# Patient Record
Sex: Male | Born: 1960 | Race: White | Hispanic: No | Marital: Married | Smoking: Former smoker
Health system: Southern US, Community
[De-identification: ages and names within clinical notes are randomized; demographics above are authoritative.]

## PROBLEM LIST (undated history)

## (undated) DIAGNOSIS — F419 Anxiety disorder, unspecified: Secondary | ICD-10-CM

## (undated) DIAGNOSIS — C182 Malignant neoplasm of ascending colon: Secondary | ICD-10-CM

## (undated) DIAGNOSIS — I10 Essential (primary) hypertension: Secondary | ICD-10-CM

## (undated) DIAGNOSIS — J302 Other seasonal allergic rhinitis: Secondary | ICD-10-CM

## (undated) HISTORY — PX: COLON SURGERY: SHX602

---
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
## 1990-05-15 HISTORY — PX: APPENDECTOMY: SHX54

## 1996-05-15 HISTORY — PX: KNEE ARTHROSCOPY W/ MENISCECTOMY: SHX1879

## 2003-05-16 HISTORY — PX: VASECTOMY: SHX75

## 2015-04-15 HISTORY — PX: COLONOSCOPY W/ BIOPSIES AND POLYPECTOMY: SHX1376

## 2016-04-14 HISTORY — PX: CATARACT EXTRACTION W/ INTRAOCULAR LENS IMPLANT: SHX1309

## 2016-05-30 ENCOUNTER — Telehealth: Payer: Self-pay | Admitting: *Deleted

## 2016-05-30 NOTE — Telephone Encounter (Signed)
Oncology Nurse Navigator Documentation  Oncology Nurse Navigator Flowsheets 05/30/2016  Navigator Location [facility]  Referral date to RadOnc/MedOnc 05/30/2016  Navigator Encounter Type Introductory phone call  Abnormal Finding Date 05/06/2015  Confirmed Diagnosis Date 05/06/2015  Spoke with patient and provided new patient appointment for 06/05/16 at 3:00 (lab) and 3:30 ([name]). Informed of location of [place], valet service, and registration process. Reminded to bring photo ID,  insurance cards and a current medication list, including supplements. Requested he obtain his CT scan from 05/29/16 on CD from [facility] and bring it with him to the appointment. He agrees to do so. Patient verbalizes understanding. Notified HIM of appointment and requested they alert referring provider of the appointment.

## 2016-06-01 ENCOUNTER — Telehealth: Payer: Self-pay | Admitting: *Deleted

## 2016-06-01 ENCOUNTER — Encounter: Payer: Self-pay | Admitting: *Deleted

## 2016-06-01 ENCOUNTER — Other Ambulatory Visit: Payer: BLUE CROSS/BLUE SHIELD

## 2016-06-01 ENCOUNTER — Encounter: Payer: Self-pay | Admitting: Hematology

## 2016-06-01 ENCOUNTER — Telehealth: Payer: Self-pay | Admitting: Hematology

## 2016-06-01 ENCOUNTER — Ambulatory Visit (HOSPITAL_BASED_OUTPATIENT_CLINIC_OR_DEPARTMENT_OTHER): Payer: BLUE CROSS/BLUE SHIELD | Admitting: Hematology

## 2016-06-01 DIAGNOSIS — R634 Abnormal weight loss: Secondary | ICD-10-CM

## 2016-06-01 DIAGNOSIS — D649 Anemia, unspecified: Secondary | ICD-10-CM

## 2016-06-01 DIAGNOSIS — C189 Malignant neoplasm of colon, unspecified: Secondary | ICD-10-CM

## 2016-06-01 DIAGNOSIS — R630 Anorexia: Secondary | ICD-10-CM

## 2016-06-01 DIAGNOSIS — R932 Abnormal findings on diagnostic imaging of liver and biliary tract: Secondary | ICD-10-CM

## 2016-06-01 DIAGNOSIS — C787 Secondary malignant neoplasm of liver and intrahepatic bile duct: Principal | ICD-10-CM

## 2016-06-01 DIAGNOSIS — R109 Unspecified abdominal pain: Secondary | ICD-10-CM | POA: Diagnosis not present

## 2016-06-01 NOTE — Progress Notes (Signed)
Oncology Nurse Navigator Documentation  Oncology Nurse Navigator Flowsheets 06/01/2016  Navigator Location [facility]  Referral date to RadOnc/MedOnc -  Navigator Encounter Type Initial MedOnc  Abnormal Finding Date -  Confirmed Diagnosis Date -  Patient Visit Type MedOnc;Initial  Treatment Phase Pre-Tx/Tx Discussion;Abnormal Scans--MD will take CD to radiology to load and review  Barriers/Navigation Needs Education;Coordination of Care  Education Newly Diagnosed Cancer Education  Interventions Education;Coordination of Care  Coordination of Care Radiology--message to managed care for PET authorization asap  Education Method Verbal;Written  Support Groups/Services GI Support Group;Other--[place] Support Services; declined ACS   Acuity Level 2  Time Spent with Patient 15  Met with patient and wife after new patient visit. Explained the role of the GI Nurse Navigator and provided New Patient Packet with information on: 1. Colon cancer--CEA test, anatomy of colon 2. Support groups 3. Advanced Directives 4. Fall Safety Plan Answered questions, reviewed current treatment plan using TEACH back and provided emotional support. Provided copy of current treatment plan. Inquired if he is willing to go to [facility] for PET if it can be done sooner there and he agrees. He is anxious to get started on whatever he needs to do to stay well. Escorted he and his wife to scheduler.  Nicholas Elks, RN, BSN GI Oncology [place]

## 2016-06-01 NOTE — Progress Notes (Signed)
[address]  Telephone:([phone] Fax:([phone] [place] Note   Patient Care Team: Curlene Labrum, MD as PCP - General (Family Medicine) 06/01/2016  Referring physician: Curlene Labrum, MD  CHIEF COMPLAINTS/PURPOSE OF CONSULTATION:  Right colon cancer and liver mass  Oncology History   Cancer Staging Metastatic colon cancer to liver [facility]) Staging form: Colon and Rectum - Neuroendocine Tumors, AJCC 8th Edition - Clinical stage from 06/01/2016: Stage IV (cTX, cNX, cM1a) - Signed by Truitt Merle, MD on 06/01/2016       Metastatic colon cancer to liver ([place])   04/2015 Procedure    Colonoscopy by Dr. Ladona Horns. It showed showed 2 sessile polyps ready between 3-5 mm in size located 20 cm (A, B) from the point of entry, polypectomy was performed. Pedunculated polyp was found in the ascending colon (C), polypectomy was performed, and additional polyp (D) was found 30 cm from the point of entry, removed      04/2015 Pathology Results    tubular adenoma (A and B), and well differentiated adenocarcinoma arising from tubulovillous adenoma (C) and well differentiated adenocarcinoma arising from severe dysplasia to intramucosal carcinoma within tubular adenoma.       04/2015 Initial Diagnosis    Metastatic colon cancer to liver ([place])      05/29/2016 Imaging    CT abdomen and pelvis with contrast showed an apple core like stricture in right colon just above the cecum, measuring 3.2 cm in lengths. This is highly suspicious for malignancy. Small lymph node a noticed he had adjacent mesentery, measuring 8 mm. There is a low-density lesion within the inferior aspect of the right hepatic lobe measuring 1.7 cm, suspicious for [place].        HISTORY OF PRESENTING ILLNESS:  Nicholas Lowery 56 y.o. male is here because of his recently abdominal CT which is highly suspicious for metastatic colon cancer. He is accompanied by his wife to my clinic today. He was  referred by his primary care physician Dr. Pleas Koch.   He had colonoscopy in 2016 for mild rectal bleeding, he describe small amount fresh blood mixed with stool, he has no other constitutional symptoms at that time. He was referred to gastroenterologist Dr. Ladona Horns and underwent a colonoscopy in December 2016. The colonoscopy showed 2 sessile polyps ready between 3-5 mm in size located 20 cm (A, B) from the point of entry, polypectomy was performed. Pedunculated polyp was found in the ascending colon (C), polypectomy was performed, and additional polyp (D) was found 30 cm from the point of entry, removed. The pathology reviewed tubular adenoma (A and B), and well differentiated adenocarcinoma arising from tubulovillous adenoma (C) and well differentiated adenocarcinoma arising from severe dysplasia to intramucosal carcinoma within tubular adenoma. Dr. Tye [place] tried multiple times to reach patient, but patient sought they were calling him about the bill, and did not return the phone calls. He was not aware the cancer diagnosis until recently.   He started having diarrhea and vomiting in mid Dec 2017, and felt a "pop" in right side abdomen, he was seen at urgent care, and was treated with antiemetics, and IVF, lab tests were OK. Due to his persistent intermittent diarrhea and epigastric pain since then, he was seen by PCP and he eventually had CT abdomen and pelvis scan which showed a upper core lesion in the ascending colon, and I'll 1.7 cm lesion in the liver, highly suspicious for metastasis. He was referred to [place] for further evaluation.  He  has lost 30 lbs in the past one month, has low appetite, eats a small meals 1-2 times a day. He has moderate fatigue, able to tolerate routine activities including his work, but feels exhausted at the end of study. He has occasional constipation, denies recent rectal bleeding.  MEDICAL HISTORY:  History reviewed. No pertinent past medical history.  SURGICAL  HISTORY: Past Surgical History:  Procedure Laterality Date  . hernia repaire    . Right knee surgery, meniscectomy  1998  . VASECTOMY  2005    SOCIAL HISTORY: Social History   Social History  . Marital status: Single    Spouse name: N/A  . Number of children: N/A  . Years of education: N/A   Occupational History  . Not on file.   Social History Main Topics  . Smoking status: Never Smoker  . Smokeless tobacco: Never Used  . Alcohol use 2.4 oz/week    4 Cans of beer per week     Comment: quit in 04/2016   . Drug use: No  . Sexual activity: Not on file   Other Topics Concern  . Not on file   Social History Narrative  . No narrative on file   He is married.  They have 3 boys, 53-12 yo. He works for a [employer], desk job.   FAMILY HISTORY: Family History  Problem Relation Age of Onset  . Cancer Mother     lung cancer  . Hypertension Father   . CAD Father   . Cancer Maternal Grandfather     prostate cancer     ALLERGIES:  has no allergies on file.  MEDICATIONS:  Current Outpatient Prescriptions  Medication Sig Dispense Refill  . FLUoxetine (PROZAC) 40 MG capsule Take 40 mg by mouth daily.    . celecoxib (CELEBREX) 200 MG capsule Take 200 mg by mouth daily.  1  . ondansetron (ZOFRAN-ODT) 4 MG disintegrating tablet   0  . promethazine (PHENERGAN) 25 MG tablet Take 25 mg by mouth every 6 (six) hours as needed. for nausea  1   No current facility-administered medications for this visit.     REVIEW OF SYSTEMS:   Constitutional: Denies fevers, chills or abnormal night sweats Eyes: Denies blurriness of vision, double vision or watery eyes Ears, nose, mouth, throat, and face: Denies mucositis or sore throat Respiratory: Denies cough, dyspnea or wheezes Cardiovascular: Denies palpitation, chest discomfort or lower extremity swelling Gastrointestinal:  Denies nausea, heartburn or change in bowel habits Skin: Denies abnormal skin rashes Lymphatics:  Denies new lymphadenopathy or easy bruising Neurological:Denies numbness, tingling or new weaknesses Behavioral/Psych: Mood is stable, no new changes  All other systems were reviewed with the patient and are negative.  PHYSICAL EXAMINATION: ECOG PERFORMANCE STATUS: 1 - Symptomatic but completely ambulatory  Vitals:   06/01/16 1348  BP: (!) 150/96  Pulse: 72  Resp: 18  Temp: 97.8 F (36.6 C)   Filed Weights   06/01/16 1348  Weight: 195 lb 8 oz (88.7 kg)    GENERAL:alert, no distress and comfortable SKIN: skin color, texture, turgor are normal, no rashes or significant lesions EYES: normal, conjunctiva are pink and non-injected, sclera clear OROPHARYNX:no exudate, no erythema and lips, buccal mucosa, and tongue normal  NECK: supple, thyroid normal size, non-tender, without nodularity LYMPH:  no palpable lymphadenopathy in the cervical, axillary or inguinal LUNGS: clear to auscultation and percussion with normal breathing effort HEART: regular rate & rhythm and no murmurs and no lower extremity edema ABDOMEN:abdomen soft,  non-tender and normal bowel sounds Musculoskeletal:no cyanosis of digits and no clubbing  PSYCH: alert & oriented x 3 with fluent speech NEURO: no focal motor/sensory deficits  LABORATORY DATA:  I have reviewed the data as listed No flowsheet data found.  I reviewed his outside lab from 05/22/2016     Hepatitis A IgM negative, HBsAg (-), HCV Ab (-)  WBC 3.6, hemoglobin 11.7, hematocrit 35.2%, MCV 91, PLT 238k CMP normal                       RADIOGRAPHIC STUDIES: I have personally reviewed his outside CT scan from 05/29/2016 and agreed with the findings in the report. No results found.  ASSESSMENT & PLAN:  56 year old Caucasian male, without significant past medical history, presented with rectal bleeding in December 2016, colonoscopy showed 4 polyps, 2 of them showed invasive adenocarcinoma, unfortunately he was not aware of the pathology findings and was  not treated. He now presented with fatigue, weight loss, anorexia and abdominal pain  1. Right colon cancer with probable liver metastasis -I reviewed his outside colonoscopy report, and biopsy results in December 2016, and discussed with patient in details. -I reviewed his outside CT scan from 05/29/2016, and discussed the findings with patient and his wife. -He likely had early stage colon adenocarcinoma in December 2016, unfortunately was not treated, and now has developed liver metastasis. -I recommend a PET scan for further staging, ruled out other metastasis. -I recommend ultrasound-guided liver biopsy to confirm metastasis, and obtain tumor tissue for genomic testing- Foundation One  -I discussed the treatment options for metastatic colon cancer. If the PET scan showed no additional metastasis, his oligo liver metastasis could be potentially curable with chemotherapy and surgery. I recommend him to have systemic chemotherapy first, and consider surgical resection of his primary tumor and liver metastasis after 3-6 months of chemotherapy. His liver lesion is located in the inferior peripheral right upper lobe, resection would be very feasible -He does not appear to have bowel obstruction, or over to GI bleeding, I do not think he needs upfront surgery to remove his primary tumor. -If the PET scan reveals additional multiple metastatic lesions, then his cancer is likely incurable. -I briefly discussed systemic chemotherapy options. Due to his busy working schedule, young age, and good performance status, I would recommend CAPOX with Avastin or Panitumumab, based on Foundation One result  -We'll obtain CEA level  2. Anemia -He has mild anemia, likely related to his colon cancer bleeding -I recommend him to take oral iron supplement over-the-counter, potential side effects of constipation and gastric or discomfort or discussed with him -I'll check is on level on his next lab draw  3. Anorexia  and weight loss -Secondary to underlying malignancy -I encouraged him to try nutritional supplement, such as boost or initial -Nutrition consult  4. Abdominal pain -mild, he takes celecobix as needed   Plan -PET asap -IR liver biopsy, to confirm metastasis, and obtain tumor tissue for Foundation one -I will see him back in 2 weeks after scan and biopsy -will present his case in our GI tumor board after PET scan   Orders Placed This Encounter  Procedures  . NM PET Image Initial (PI) Skull Base To Thigh    Standing Status:   Future    Standing Expiration Date:   06/01/2017    Order Specific Question:   Reason for Exam (SYMPTOM  OR DIAGNOSIS REQUIRED)    Answer:   staging  Order Specific Question:   Preferred imaging location?    Answer:   [facility]    Order Specific Question:   If indicated for the ordered procedure, I authorize the administration of a radiopharmaceutical per Radiology protocol    Answer:   Yes    All questions were answered. The patient knows to call the clinic with any problems, questions or concerns. I spent 55 minutes counseling the patient face to face. The total time spent in the appointment was 60 minutes and more than 50% was on counseling.     Truitt Merle, MD 06/01/2016

## 2016-06-01 NOTE — Telephone Encounter (Signed)
Appointments scheduled per 1/18 LOS. Patient given AVS report and calendars with future scheduled appointments. °

## 2016-06-01 NOTE — Telephone Encounter (Signed)
Called patient to offer appointment for this afternoon due to cancellations. He reports he will leave now for his appointment. MD aware.

## 2016-06-02 ENCOUNTER — Telehealth: Payer: Self-pay | Admitting: *Deleted

## 2016-06-02 NOTE — Telephone Encounter (Signed)
Oncology Nurse Navigator Documentation  Oncology Nurse Navigator Flowsheets 06/02/2016  Navigator Location [facility]  Referral date to RadOnc/MedOnc -  Navigator Encounter Type Telephone  Telephone Outgoing Call;Appt Confirmation/Clarification  Abnormal Finding Date -  Confirmed Diagnosis Date -  Patient Visit Type -  Treatment Phase -  Barriers/Navigation Needs Coordination of Care--PET/biopsy  Education -  Interventions Coordination of Care--scheduled PET for 1/25 at 1200/1230 at [place].  Coordination of Care Radiology--called Vivien Rota in central scheduling to let radiology know that PET is 1/25 and hope for Bx on 1/26 or 1/29 if possible.  Education Method -  Support Groups/Services -  Acuity -  Time Spent with Patient 15  Nicholas Lowery made aware of prep for PET scan.

## 2016-06-05 ENCOUNTER — Other Ambulatory Visit: Payer: Self-pay

## 2016-06-05 ENCOUNTER — Ambulatory Visit: Payer: Self-pay | Admitting: Hematology

## 2016-06-06 ENCOUNTER — Other Ambulatory Visit (HOSPITAL_COMMUNITY)
Admission: RE | Admit: 2016-06-06 | Discharge: 2016-06-06 | Disposition: A | Discharge status: Home / Self Care | Payer: BLUE CROSS/BLUE SHIELD | Source: Ambulatory Visit | Attending: Nurse Practitioner | Admitting: Nurse Practitioner

## 2016-06-06 ENCOUNTER — Other Ambulatory Visit: Payer: Self-pay | Admitting: *Deleted

## 2016-06-06 ENCOUNTER — Telehealth: Payer: Self-pay | Admitting: *Deleted

## 2016-06-06 ENCOUNTER — Encounter: Payer: Self-pay | Admitting: Nurse Practitioner

## 2016-06-06 ENCOUNTER — Ambulatory Visit (HOSPITAL_BASED_OUTPATIENT_CLINIC_OR_DEPARTMENT_OTHER): Payer: BLUE CROSS/BLUE SHIELD

## 2016-06-06 ENCOUNTER — Ambulatory Visit (HOSPITAL_BASED_OUTPATIENT_CLINIC_OR_DEPARTMENT_OTHER): Payer: BLUE CROSS/BLUE SHIELD | Admitting: Nurse Practitioner

## 2016-06-06 VITALS — BP 124/93 | HR 94 | Temp 98.1°F | Resp 18 | Ht 71.0 in | Wt 197.3 lb

## 2016-06-06 DIAGNOSIS — R197 Diarrhea, unspecified: Secondary | ICD-10-CM

## 2016-06-06 DIAGNOSIS — C787 Secondary malignant neoplasm of liver and intrahepatic bile duct: Secondary | ICD-10-CM

## 2016-06-06 DIAGNOSIS — E860 Dehydration: Secondary | ICD-10-CM | POA: Diagnosis not present

## 2016-06-06 DIAGNOSIS — C189 Malignant neoplasm of colon, unspecified: Secondary | ICD-10-CM

## 2016-06-06 DIAGNOSIS — R030 Elevated blood-pressure reading, without diagnosis of hypertension: Secondary | ICD-10-CM

## 2016-06-06 LAB — COMPREHENSIVE METABOLIC PANEL
ALT: 14 U/L (ref 0–55)
AST: 14 U/L (ref 5–34)
Albumin: 3.6 g/dL (ref 3.5–5.0)
Alkaline Phosphatase: 71 U/L (ref 40–150)
Anion Gap: 10 mEq/L (ref 3–11)
BUN: 19.8 mg/dL (ref 7.0–26.0)
CHLORIDE: 104 meq/L (ref 98–109)
CO2: 25 meq/L (ref 22–29)
CREATININE: 1.1 mg/dL (ref 0.7–1.3)
Calcium: 9.7 mg/dL (ref 8.4–10.4)
EGFR: 79 mL/min/{1.73_m2} — ABNORMAL LOW (ref >90)
GLUCOSE: 98 mg/dL (ref 70–140)
POTASSIUM: 4.4 meq/L (ref 3.5–5.1)
SODIUM: 139 meq/L (ref 136–145)
Total Bilirubin: 0.56 mg/dL (ref 0.20–1.20)
Total Protein: 7.1 g/dL (ref 6.4–8.3)

## 2016-06-06 LAB — C DIFFICILE QUICK SCREEN W PCR REFLEX
C DIFFICLE (CDIFF) ANTIGEN: NEGATIVE
C Diff interpretation: NOT DETECTED
C Diff toxin: NEGATIVE

## 2016-06-06 LAB — CBC WITH DIFFERENTIAL/PLATELET
BASO%: 0.6 % (ref 0.0–2.0)
BASOS ABS: 0 10*3/uL (ref 0.0–0.1)
EOS%: 1.5 % (ref 0.0–7.0)
Eosinophils Absolute: 0.1 10*3/uL (ref 0.0–0.5)
HEMATOCRIT: 37.1 % — AB (ref 38.4–49.9)
HGB: 12.3 g/dL — ABNORMAL LOW (ref 13.0–17.1)
LYMPH#: 0.7 10*3/uL — AB (ref 0.9–3.3)
LYMPH%: 14 % (ref 14.0–49.0)
MCH: 29.9 pg (ref 27.2–33.4)
MCHC: 33.2 g/dL (ref 32.0–36.0)
MCV: 90.3 fL (ref 79.3–98.0)
MONO#: 1.3 10*3/uL — ABNORMAL HIGH (ref 0.1–0.9)
MONO%: 28.4 % — AB (ref 0.0–14.0)
NEUT#: 2.6 10*3/uL (ref 1.5–6.5)
NEUT%: 55.5 % (ref 39.0–75.0)
Platelets: 260 10*3/uL (ref 140–400)
RBC: 4.11 10*6/uL — AB (ref 4.20–5.82)
RDW: 13.6 % (ref 11.0–14.6)
WBC: 4.7 10*3/uL (ref 4.0–10.3)

## 2016-06-06 LAB — MAGNESIUM: MAGNESIUM: 2.2 mg/dL (ref 1.5–2.5)

## 2016-06-06 LAB — CEA (IN HOUSE-CHCC): CEA (CHCC-In House): 9.99 ng/mL — ABNORMAL HIGH (ref 0.00–5.00)

## 2016-06-06 MED ORDER — SODIUM CHLORIDE 0.9 % IV SOLN
1000.0000 mL | Freq: Once | INTRAVENOUS | Status: AC
  Administered 2016-06-06: 1000 mL via INTRAVENOUS

## 2016-06-06 MED ORDER — LOPERAMIDE HCL 2 MG PO CAPS
ORAL_CAPSULE | ORAL | Status: AC
  Filled 2016-06-06: qty 2

## 2016-06-06 MED ORDER — DIPHENOXYLATE-ATROPINE 2.5-0.025 MG PO TABS: 2.0000 | ORAL_TABLET | Freq: Four times a day (QID) | ORAL | 0 refills | Status: DC | PRN

## 2016-06-06 MED ORDER — LOPERAMIDE HCL 2 MG PO TABS
4.0000 mg | ORAL_TABLET | Freq: Once | ORAL | Status: AC
  Administered 2016-06-06: 4 mg via ORAL
  Filled 2016-06-06: qty 2

## 2016-06-06 MED ORDER — ONDANSETRON 8 MG PO TBDP: 8.0000 mg | ORAL_TABLET | Freq: Three times a day (TID) | ORAL | 2 refills | Status: DC | PRN

## 2016-06-06 NOTE — Telephone Encounter (Signed)
Received vm call from pt stating that he has severe cramping in his abdomen & kidneys that comes in waves & at worst rates @ 8-9 & brings tears to eyes.  He states that his appetite is poor & is trying to drink but feels dehydrated.  He is having watery diarrhea-8 x's yesterday & twice this am. ' Pt called back & discussed symptoms.  Instructed to come @ 11:30 for labs & see Selena Lesser NP.  He was in agreement.  Labs ordered.

## 2016-06-06 NOTE — Assessment & Plan Note (Signed)
Patient's blood pressure on initial check today was 124/93.  Patient states he has no history of high blood pressure in the past.  He doesn't take any blood pressure medications.  He states he has become extremely stressed and anxious regarding his new cancer diagnosis.  Patient was advised to recheck his blood pressure at home; and to follow-up with his primary care physician regarding his mildly elevated blood pressure.  He was also advised to call/return for continued elevated blood pressures.

## 2016-06-06 NOTE — Assessment & Plan Note (Signed)
Patient states that he has had very frequent watery diarrhea episodes-up to 10 diarrhea episodes per day-for the past several days.  He has not tried any over-the-counter medications for the treatment of the diarrhea.  He feels dehydrated and weak today.  He also complains of some significant cramping in his abdomen as well.  Patient does appear mildly dehydrated today.  He was given Imodium 2 tablets while in the [place] for the cramping and diarrhea.  He also was able to give a stool sample which was negative for C. difficile.  He was given IV fluid rehydration today as well.

## 2016-06-06 NOTE — Assessment & Plan Note (Signed)
Patient was recently diagnosed with colon cancer with metastasis to the liver.  Patient has not yet begun any cancer treatments.  CEA obtained today was 9.99.  Patient is scheduled for a PET scan on 06/08/2016.  He is scheduled for liver biopsy on 06/13/2016.  He is scheduled for labs and a follow-up visit to discuss his scans and make a plan of treatment on 06/15/2016.

## 2016-06-06 NOTE — Patient Instructions (Signed)
Dehydration, Adult Dehydration is a condition in which there is not enough fluid or water in the body. This happens when you lose more fluids than you take in. Important organs, such as the kidneys, brain, and heart, cannot function without a proper amount of fluids. Any loss of fluids from the body can lead to dehydration. Dehydration can range from mild to severe. This condition should be treated right away to prevent it from becoming severe. What are the causes? This condition may be caused by:  Vomiting.  Diarrhea.  Excessive sweating, such as from heat exposure or exercise.  Not drinking enough fluid, especially:  When ill.  While doing activity that requires a lot of energy.  Excessive urination.  Fever.  Infection.  Certain medicines, such as medicines that cause the body to lose excess fluid (diuretics).  Inability to access safe drinking water.  Reduced physical ability to get adequate water and food. What increases the risk? This condition is more likely to develop in people:  Who have a poorly controlled long-term (chronic) illness, such as diabetes, heart disease, or kidney disease.  Who are age 75 or older.  Who are disabled.  Who live in a place with high altitude.  Who play endurance sports. What are the signs or symptoms? Symptoms of mild dehydration may include:  Thirst.  Dry lips.  Slightly dry mouth.  Dry, warm skin.  Dizziness. Symptoms of moderate dehydration may include:  Very dry mouth.  Muscle cramps.  Dark urine. Urine may be the color of tea.  Decreased urine production.  Decreased tear production.  Heartbeat that is irregular or faster than normal (palpitations).  Headache.  Light-headedness, especially when you stand up from a sitting position.  Fainting (syncope). Symptoms of severe dehydration may include:  Changes in skin, such as:  Cold and clammy skin.  Blotchy (mottled) or pale skin.  Skin that does not  quickly return to normal after being lightly pinched and released (poor skin turgor).  Changes in body fluids, such as:  Extreme thirst.  No tear production.  Inability to sweat when body temperature is high, such as in hot weather.  Very little urine production.  Changes in vital signs, such as:  Weak pulse.  Pulse that is more than 100 beats a minute when sitting still.  Rapid breathing.  Low blood pressure.  Other changes, such as:  Sunken eyes.  Cold hands and feet.  Confusion.  Lack of energy (lethargy).  Difficulty waking up from sleep.  Short-term weight loss.  Unconsciousness. How is this diagnosed? This condition is diagnosed based on your symptoms and a physical exam. Blood and urine tests may be done to help confirm the diagnosis. How is this treated? Treatment for this condition depends on the severity. Mild or moderate dehydration can often be treated at home. Treatment should be started right away. Do not wait until dehydration becomes severe. Severe dehydration is an emergency and it needs to be treated in a hospital. Treatment for mild dehydration may include:  Drinking more fluids.  Replacing salts and minerals in your blood (electrolytes) that you may have lost. Treatment for moderate dehydration may include:  Drinking an oral rehydration solution (ORS). This is a drink that helps you replace fluids and electrolytes (rehydrate). It can be found at pharmacies and retail stores. Treatment for severe dehydration may include:  Receiving fluids through an IV tube.  Receiving an electrolyte solution through a feeding tube that is passed through your nose and into  your stomach (nasogastric tube, or NG tube).  Correcting any abnormalities in electrolytes.  Treating the underlying cause of dehydration. Follow these instructions at home:  If directed by your health care provider, drink an ORS:  Make an ORS by following instructions on the  package.  Start by drinking small amounts, about  cup (120 mL) every 5-10 minutes.  Slowly increase how much you drink until you have taken the amount recommended by your health care provider.  Drink enough clear fluid to keep your urine clear or pale yellow. If you were told to drink an ORS, finish the ORS first, then start slowly drinking other clear fluids. Drink fluids such as:  Water. Do not drink only water. Doing that can lead to having too little salt (sodium) in the body (hyponatremia).  Ice chips.  Fruit juice that you have added water to (diluted fruit juice).  Low-calorie sports drinks.  Avoid:  Alcohol.  Drinks that contain a lot of sugar. These include high-calorie sports drinks, fruit juice that is not diluted, and soda.  Caffeine.  Foods that are greasy or contain a lot of fat or sugar.  Take over-the-counter and prescription medicines only as told by your health care provider.  Do not take sodium tablets. This can lead to having too much sodium in the body (hypernatremia).  Eat foods that contain a healthy balance of electrolytes, such as bananas, oranges, potatoes, tomatoes, and spinach.  Keep all follow-up visits as told by your health care provider. This is important. Contact a health care provider if:  You have abdominal pain that:  Gets worse.  Stays in one area (localizes).  You have a rash.  You have a stiff neck.  You are more irritable than usual.  You are sleepier or more difficult to wake up than usual.  You feel weak or dizzy.  You feel very thirsty.  You have urinated only a small amount of very dark urine over 6-8 hours. Get help right away if:  You have symptoms of severe dehydration.  You cannot drink fluids without vomiting.  Your symptoms get worse with treatment.  You have a fever.  You have a severe headache.  You have vomiting or diarrhea that:  Gets worse.  Does not go away.  You have blood or green matter  (bile) in your vomit.  You have blood in your stool. This may cause stool to look black and tarry.  You have not urinated in 6-8 hours.  You faint.  Your heart rate while sitting still is over 100 beats a minute.  You have trouble breathing. This information is not intended to replace advice given to you by your health care provider. Make sure you discuss any questions you have with your health care provider. Document Released: 05/01/2005 Document Revised: 11/26/2015 Document Reviewed: 06/25/2015 Elsevier Interactive Patient Education  2017 Else[employer]    Diarrhea, Adult Diarrhea is frequent loose and watery bowel movements. Diarrhea can make you feel weak and cause you to become dehydrated. Dehydration can make you tired and thirsty, cause you to have a dry mouth, and decrease how often you urinate. Diarrhea typically lasts 2-3 days. However, it can last longer if it is a sign of something more serious. It is important to treat your diarrhea as told by your health care provider. Follow these instructions at home: Eating and drinking Follow these recommendations as told by your health care provider:  Take an oral rehydration solution (ORS). This is a drink  that is sold at pharmacies and retail stores.  Drink clear fluids, such as water, ice chips, diluted fruit juice, and low-calorie sports drinks.  Eat bland, easy-to-digest foods in small amounts as you are able. These foods include bananas, applesauce, rice, lean meats, toast, and crackers.  Avoid drinking fluids that contain a lot of sugar or caffeine, such as energy drinks, sports drinks, and soda.  Avoid alcohol.  Avoid spicy or fatty foods. General instructions  Drink enough fluid to keep your urine clear or pale yellow.  Wash your hands often. If soap and water are not available, use hand sanitizer.  Make sure that all people in your household wash their hands well and often.  Take over-the-counter and prescription  medicines only as told by your health care provider.  Rest at home while you recover.  Watch your condition for any changes.  Take a warm bath to relieve any burning or pain from frequent diarrhea episodes.  Keep all follow-up visits as told by your health care provider. This is important. Contact a health care provider if:  You have a fever.  Your diarrhea gets worse.  You have new symptoms.  You cannot keep fluids down.  You feel light-headed or dizzy.  You have a headache  You have muscle cramps. Get help right away if:  You have chest pain.  You feel extremely weak or you faint.  You have bloody or black stools or stools that look like tar.  You have severe pain, cramping, or bloating in your abdomen.  You have trouble breathing or you are breathing very quickly.  Your heart is beating very quickly.  Your skin feels cold and clammy.  You feel confused.  You have signs of dehydration, such as:  Dark urine, very little urine, or no urine.  Cracked lips.  Dry mouth.  Sunken eyes.  Sleepiness.  Weakness. This information is not intended to replace advice given to you by your health care provider. Make sure you discuss any questions you have with your health care provider. Document Released: 04/21/2002 Document Revised: 09/09/2015 Document Reviewed: 01/05/2015 Elsevier Interactive Patient Education  2017 [employer] Diet Introduction A bland diet consists of foods that do not have a lot of fat or fiber. Foods without fat or fiber are easier for the body to digest. They are also less likely to irritate your mouth, throat, stomach, and other parts of your gastrointestinal tract. A bland diet is sometimes called a BRAT diet. What is my plan? Your health care provider or dietitian may recommend specific changes to your diet to prevent and treat your symptoms, such as:  Eating small meals often.  Cooking food until it is soft enough to chew  easily.  Chewing your food well.  Drinking fluids slowly.  Not eating foods that are very spicy, sour, or fatty.  Not eating citrus fruits, such as oranges and grapefruit. What do I need to know about this diet?  Eat a variety of foods from the bland diet food list.  Do not follow a bland diet longer than you have to.  Ask your health care provider whether you should take vitamins. What foods can I eat? Grains  Hot cereals, such as cream of wheat. Bread, crackers, or tortillas made from refined white flour. Rice. Vegetables  Canned or cooked vegetables. Mashed or boiled potatoes. Fruits  Bananas. Applesauce. Other types of cooked or canned fruit with the skin and seeds removed, such as canned  peaches or pears. Meats and Other Protein Sources  Scrambled eggs. Creamy peanut butter or other nut butters. Lean, well-cooked meats, such as chicken or fish. Tofu. Soups or broths. Dairy  Low-fat dairy products, such as milk, cottage cheese, or yogurt. Beverages  Water. Herbal tea. Apple juice. Sweets and Desserts  Pudding. Custard. Fruit gelatin. Ice cream. Fats and Oils  Mild salad dressings. Canola or olive oil. The items listed above may not be a complete list of allowed foods or beverages. Contact your dietitian for more options.  What foods are not recommended? Foods and ingredients that are often not recommended include:  Spicy foods, such as hot sauce or salsa.  Fried foods.  Sour foods, such as pickled or fermented foods.  Raw vegetables or fruits, especially citrus or berries.  Caffeinated drinks.  Alcohol.  Strongly flavored seasonings or condiments. The items listed above may not be a complete list of foods and beverages that are not allowed. Contact your dietitian for more information.  This information is not intended to replace advice given to you by your health care provider. Make sure you discuss any questions you have with your health care  provider. Document Released: 08/23/2015 Document Revised: 10/07/2015 Document Reviewed: 05/13/2014  2017 Elsevier

## 2016-06-06 NOTE — Progress Notes (Signed)
Education provided re: dehydration, BRAT diet as his stomach recovers from this episode of diarrhea.  Began conversation regarding chemo education and self care. Pt has PET scan on thursday and liver biopsy next week. Then he will see [name] to finalize chemo therapy plans.  Encouraged pt to stay hydrated now and in the coming weeks, eat healthy and keep active.  Pt receptive to education.  He is aware of his chemo education class he will attend prior to starting chemotherapy

## 2016-06-06 NOTE — Assessment & Plan Note (Addendum)
Patient states that he has had very frequent watery diarrhea episodes-up to 10 diarrhea episodes per day-for the past several days.  He has not tried any over-the-counter medications for the treatment of the diarrhea.  He feels dehydrated and weak today.  He also complains of some significant cramping in his abdomen as well.  Patient does appear mildly dehydrated today.  He was given Imodium 2 tablets while in the [place] for the cramping and diarrhea.  Patient was also given a prescription for Lomotil to alternate with the Imodium for his diarrhea.  He also was able to give a stool sample which was negative for C. difficile.  He was given IV fluid rehydration today as well.

## 2016-06-06 NOTE — Progress Notes (Signed)
SYMPTOM MANAGEMENT CLINIC    Chief Complaint: Diarrhea, dehydration  HPI:  Nicholas Lowery 56 y.o. male diagnosed with colon cancer with liver metastasis.  Has not begun a chemotherapy regimen as of yet.   Oncology History   Cancer Staging Metastatic colon cancer to liver [facility]) Staging form: Colon and Rectum - Neuroendocine Tumors, AJCC 8th Edition - Clinical stage from 06/01/2016: Stage IV (cTX, cNX, [place]) - Signed by Truitt Merle, MD on 06/01/2016       Metastatic colon cancer to liver [facility])   04/2015 Procedure    Colonoscopy by Dr. Ladona Horns. It showed showed 2 sessile polyps ready between 3-5 mm in size located 20 cm (A, B) from the point of entry, polypectomy was performed. Pedunculated polyp was found in the ascending colon (C), polypectomy was performed, and additional polyp (D) was found 30 cm from the point of entry, removed      04/2015 Pathology Results    tubular adenoma (A and B), and well differentiated adenocarcinoma arising from tubulovillous adenoma (C) and well differentiated adenocarcinoma arising from severe dysplasia to intramucosal carcinoma within tubular adenoma.       04/2015 Initial Diagnosis    Metastatic colon cancer to liver ([place])      05/29/2016 Imaging    CT abdomen and pelvis with contrast showed an apple core like stricture in right colon just above the cecum, measuring 3.2 cm in lengths. This is highly suspicious for malignancy. Small lymph node a noticed he had adjacent mesentery, measuring 8 mm. There is a low-density lesion within the inferior aspect of the right hepatic lobe measuring 1.7 cm, suspicious for [place].        Review of Systems  Constitutional: Positive for malaise/fatigue.  Gastrointestinal: Positive for abdominal pain and diarrhea.  All other systems reviewed and are negative.   History reviewed. No pertinent past medical history.  Past Surgical History:  Procedure Laterality Date  . hernia repaire    . Right knee  surgery, meniscectomy  1998  . VASECTOMY  2005    has Metastatic colon cancer to liver [facility]); Diarrhea; Dehydration; and Elevated blood pressure reading on his problem list.    is allergic to penicillins.  Allergies as of 06/06/2016      Reactions   Penicillins       Medication List       Accurate as of 06/06/16  5:45 PM. Always use your most recent med list.          celecoxib 200 MG capsule Commonly known as:  CELEBREX Take 200 mg by mouth daily.   diphenoxylate-atropine 2.5-0.025 MG tablet Commonly known as:  LOMOTIL Take 2 tablets by mouth 4 (four) times daily as needed for diarrhea or loose stools.   FLUoxetine 40 MG capsule Commonly known as:  PROZAC Take 40 mg by mouth daily.   ondansetron 8 MG disintegrating tablet Commonly known as:  ZOFRAN ODT Take 1 tablet (8 mg total) by mouth every 8 (eight) hours as needed for nausea or vomiting.   promethazine 25 MG tablet Commonly known as:  PHENERGAN Take 25 mg by mouth every 6 (six) hours as needed. for nausea        PHYSICAL EXAMINATION  Oncology Vitals 06/06/2016 06/01/2016  Height 180 cm 180 cm  Weight 89.495 kg 88.678 kg  Weight (lbs) 197 lbs 5 oz 195 lbs 8 oz  BMI (kg/m2) 27.52 kg/m2 27.27 kg/m2  Temp 98.1 97.8  Pulse 94 72  Resp 18 18  SpO2 99 100  BSA (m2) 2.12 m2 2.11 m2   BP Readings from Last 2 Encounters:  06/06/16 (!) 124/93  06/01/16 (!) 150/96    Physical Exam  Constitutional: He is oriented to person, place, and time and well-developed, well-nourished, and in no distress.  HENT:  Head: Normocephalic and atraumatic.  Eyes: Conjunctivae and EOM are normal. Pupils are equal, round, and reactive to light.  Neck: Normal range of motion.  Pulmonary/Chest: Effort normal. No respiratory distress.  Musculoskeletal: Normal range of motion.  Neurological: He is alert and oriented to person, place, and time. Gait normal.  Skin: Skin is warm and dry.  Psychiatric: Affect normal.  Nursing note  and vitals reviewed.   LABORATORY DATA:. Hospital Outpatient Visit on 06/06/2016  Component Date Value Ref Range Status  . C Diff antigen 06/06/2016 NEGATIVE  NEGATIVE Final  . C Diff toxin 06/06/2016 NEGATIVE  NEGATIVE Final  . C Diff interpretation 06/06/2016 No C. difficile detected.   Final  Appointment on 06/06/2016  Component Date Value Ref Range Status  . WBC 06/06/2016 4.7  4.0 - 10.3 10e3/uL Final  . NEUT# 06/06/2016 2.6  1.5 - 6.5 10e3/uL Final  . HGB 06/06/2016 12.3* 13.0 - 17.1 g/dL Final  . HCT 06/06/2016 37.1* 38.4 - 49.9 % Final  . Platelets 06/06/2016 260  140 - 400 10e3/uL Final  . MCV 06/06/2016 90.3  79.3 - 98.0 fL Final  . MCH 06/06/2016 29.9  27.2 - 33.4 pg Final  . MCHC 06/06/2016 33.2  32.0 - 36.0 g/dL Final  . RBC 06/06/2016 4.11* 4.20 - 5.82 10e6/uL Final  . RDW 06/06/2016 13.6  11.0 - 14.6 % Final  . lymph# 06/06/2016 0.7* 0.9 - 3.3 10e3/uL Final  . MONO# 06/06/2016 1.3* 0.1 - 0.9 10e3/uL Final  . Eosinophils Absolute 06/06/2016 0.1  0.0 - 0.5 10e3/uL Final  . Basophils Absolute 06/06/2016 0.0  0.0 - 0.1 10e3/uL Final  . NEUT% 06/06/2016 55.5  39.0 - 75.0 % Final  . LYMPH% 06/06/2016 14.0  14.0 - 49.0 % Final  . MONO% 06/06/2016 28.4* 0.0 - 14.0 % Final  . EOS% 06/06/2016 1.5  0.0 - 7.0 % Final  . BASO% 06/06/2016 0.6  0.0 - 2.0 % Final  . Sodium 06/06/2016 139  136 - 145 mEq/L Final  . Potassium 06/06/2016 4.4  3.5 - 5.1 mEq/L Final  . Chloride 06/06/2016 104  98 - 109 mEq/L Final  . CO2 06/06/2016 25  22 - 29 mEq/L Final  . Glucose 06/06/2016 98  70 - 140 mg/dl Final  . BUN 06/06/2016 19.8  7.0 - 26.0 mg/dL Final  . Creatinine 06/06/2016 1.1  0.7 - 1.3 mg/dL Final  . Total Bilirubin 06/06/2016 0.56  0.20 - 1.20 mg/dL Final  . Alkaline Phosphatase 06/06/2016 71  40 - 150 U/L Final  . AST 06/06/2016 14  5 - 34 U/L Final  . ALT 06/06/2016 14  0 - 55 U/L Final  . Total Protein 06/06/2016 7.1  6.4 - 8.3 g/dL Final  . Albumin 06/06/2016 3.6  3.5 - 5.0  g/dL Final  . Calcium 06/06/2016 9.7  8.4 - 10.4 mg/dL Final  . Anion Gap 06/06/2016 10  3 - 11 mEq/L Final  . EGFR 06/06/2016 79* >90 ml/min/1.73 m2 Final  . Magnesium 06/06/2016 2.2  1.5 - 2.5 mg/dl Final  . CEA ([facility]) 06/06/2016 9.99* 0.00 - 5.00 ng/mL Final    RADIOGRAPHIC STUDIES: No results found.  ASSESSMENT/PLAN:  Metastatic colon cancer to liver [facility]) Patient was recently diagnosed with colon cancer with metastasis to the liver.  Patient has not yet begun any cancer treatments.  CEA obtained today was 9.99.  Patient is scheduled for a PET scan on 06/08/2016.  He is scheduled for liver biopsy on 06/13/2016.  He is scheduled for labs and a follow-up visit to discuss his scans and make a plan of treatment on 06/15/2016.  Elevated blood pressure reading Patient's blood pressure on initial check today was 124/93.  Patient states he has no history of high blood pressure in the past.  He doesn't take any blood pressure medications.  He states he has become extremely stressed and anxious regarding his new cancer diagnosis.  Patient was advised to recheck his blood pressure at home; and to follow-up with his primary care physician regarding his mildly elevated blood pressure.  He was also advised to call/return for continued elevated blood pressures.  Diarrhea Patient states that he has had very frequent watery diarrhea episodes-up to 10 diarrhea episodes per day-for the past several days.  He has not tried any over-the-counter medications for the treatment of the diarrhea.  He feels dehydrated and weak today.  He also complains of some significant cramping in his abdomen as well.  Patient does appear mildly dehydrated today.  He was given Imodium 2 tablets while in the [place] for the cramping and diarrhea.  Patient was also given a prescription for Lomotil to alternate with the Imodium for his diarrhea.  He also was able to give a stool sample which was negative for C.  difficile.  He was given IV fluid rehydration today as well.  Dehydration Patient states that he has had very frequent watery diarrhea episodes-up to 10 diarrhea episodes per day-for the past several days.  He has not tried any over-the-counter medications for the treatment of the diarrhea.  He feels dehydrated and weak today.  He also complains of some significant cramping in his abdomen as well.  Patient does appear mildly dehydrated today.  He was given Imodium 2 tablets while in the [place] for the cramping and diarrhea.  He also was able to give a stool sample which was negative for C. difficile.  He was given IV fluid rehydration today as well.   Patient stated understanding of all instructions; and was in agreement with this plan of care. The patient knows to call the clinic with any problems, questions or concerns.   Total time spent with patient was 25 minutes;  with greater than 75 percent of that time spent in face to face counseling regarding patient's symptoms,  and coordination of care and follow up.  Disclaimer:This dictation was prepared with Dragon/digital dictation along with [employer]. Any transcriptional errors that result from this process are unintentional.  Drue Second, NP 06/06/2016

## 2016-06-08 ENCOUNTER — Ambulatory Visit
Admission: RE | Admit: 2016-06-08 | Discharge: 2016-06-08 | Disposition: A | Discharge status: Home / Self Care | Payer: BLUE CROSS/BLUE SHIELD | Source: Ambulatory Visit | Attending: Hematology | Admitting: Hematology

## 2016-06-08 ENCOUNTER — Telehealth: Payer: Self-pay | Admitting: *Deleted

## 2016-06-08 DIAGNOSIS — K56609 Unspecified intestinal obstruction, unspecified as to partial versus complete obstruction: Secondary | ICD-10-CM | POA: Insufficient documentation

## 2016-06-08 DIAGNOSIS — C787 Secondary malignant neoplasm of liver and intrahepatic bile duct: Secondary | ICD-10-CM | POA: Diagnosis present

## 2016-06-08 DIAGNOSIS — C779 Secondary and unspecified malignant neoplasm of lymph node, unspecified: Secondary | ICD-10-CM | POA: Insufficient documentation

## 2016-06-08 DIAGNOSIS — R932 Abnormal findings on diagnostic imaging of liver and biliary tract: Secondary | ICD-10-CM | POA: Diagnosis not present

## 2016-06-08 DIAGNOSIS — C189 Malignant neoplasm of colon, unspecified: Secondary | ICD-10-CM | POA: Insufficient documentation

## 2016-06-08 DIAGNOSIS — R590 Localized enlarged lymph nodes: Secondary | ICD-10-CM | POA: Diagnosis not present

## 2016-06-08 LAB — GLUCOSE, CAPILLARY: GLUCOSE-CAPILLARY: 85 mg/dL (ref 65–99)

## 2016-06-08 MED ORDER — FLUDEOXYGLUCOSE F - 18 (FDG) INJECTION
12.5800 | Freq: Once | INTRAVENOUS | Status: AC | PRN
  Administered 2016-06-08: 12.58 via INTRAVENOUS

## 2016-06-08 NOTE — Telephone Encounter (Signed)
Received call from pt stating that his cramping had become less intense after IVF.  He took 2 lomotil at home Monday & not cramping is back to square one but he hasn't had a BM since Monday at our office.  He has a PET scheduled for this pm at [facility].  Discussed with Dr Burr Medico & instructed pt to get PET & then try Colace & increase po fluids & maybe hold on any solid foods for a while to see if cramping slows.  If Colace doesn't help, try Senna or Miralax.  Pt reports that he tried miralax & didn't like it so he will look at senna.  He will call if symptoms don't improve.

## 2016-06-09 ENCOUNTER — Telehealth: Payer: Self-pay | Admitting: Nurse Practitioner

## 2016-06-09 ENCOUNTER — Other Ambulatory Visit: Payer: Self-pay | Admitting: Hematology

## 2016-06-09 DIAGNOSIS — C787 Secondary malignant neoplasm of liver and intrahepatic bile duct: Principal | ICD-10-CM

## 2016-06-09 DIAGNOSIS — C189 Malignant neoplasm of colon, unspecified: Secondary | ICD-10-CM

## 2016-06-09 NOTE — Telephone Encounter (Signed)
Called to check in with the patient today.  Also, reviewed patient's PET scan results from yesterday afternoon. PET scan results suggest possible colonic obstruction and bowel dilatation.  Patient states that he took Imodium as directed on Tuesday, 06/06/2016; and had no further bowel movements until early this morning.  He denies any nausea or vomiting.  He states that he has been able to both drink and eat with no difficulties.  He has had decreased appetite, however.  He states that he continues with significant abdominal cramping; and the cramping To them up all night last night.  He states that he took a Colace stool softener just last night; and drink plenty of water.  He had one small normal bowel movement earlier this morning.  He denies any recent fevers or chills.  Reviewed all findings of the PET scan and patient's complaints with Dr.  Burr Medico; and she then called Dr. Kaylyn Lim, general surgeon, so he could review the scan results as well.  This provider then called the patient back to advised that Dr. Earlie Server office or our office will be in contact with him later this afternoon for further instructions regarding possible admission to the hospital and questionable need for surgery at some point in the next week.   Patient stated understanding of all instructions and was in agreement with this plan of care.

## 2016-06-11 ENCOUNTER — Other Ambulatory Visit: Payer: Self-pay | Admitting: Radiology

## 2016-06-12 ENCOUNTER — Other Ambulatory Visit: Payer: Self-pay | Admitting: General Surgery

## 2016-06-12 ENCOUNTER — Telehealth: Payer: Self-pay | Admitting: *Deleted

## 2016-06-12 MED ORDER — DEXTROSE 5 % IV SOLN: 900.0000 mg | INTRAVENOUS | Status: DC

## 2016-06-12 MED ORDER — DEXTROSE 5 % IV SOLN: 5.0000 mg/kg | INTRAVENOUS | Status: DC

## 2016-06-12 NOTE — Telephone Encounter (Signed)
Called patient back and he says that they have now changed his appointment with Dr. Hassell Done tomorrow for today with Dr. Barry Dienes. Does not understand why and if he still needs the liver biopsy? Informed him it is likely that Dr. Hassell Done was on call last week. [name] had specified Dr. Barry Dienes in her referral. Encouraged him to keep the appointment with her today and she will explain everything to him in detail and advise [place] of what to do about the liver biopsy. Likely she will want to keep it.

## 2016-06-12 NOTE — Telephone Encounter (Signed)
Called and left VM asking about issue/mix-up with his appointments.

## 2016-06-12 NOTE — Telephone Encounter (Signed)
Patient left VM that he sees Dr. Johnathan Hausen on 1/30 in [place] office to discuss surgery. Patient called and cancelled his liver biospy for 06/13/16. He is asking "what is the sequence of how things should be done with me now"?  Forwarded question to MD.

## 2016-06-12 NOTE — H&P (Signed)
Nicholas Lowery 06/12/2016 3:19 PM Location: [facility] Patient #: [id number] DOB: 1960/05/30 Married / Language: Nicholas Lowery / Race: White Male  History of Present Illness [name] MD; 06/12/2016 6:25 PM) The patient is a 56 year old male who presents with colorectal cancer. Pt is a 56 yo M referred for consultation by [name] for new diagnosis of colon cancer with near obstructing ascending colon mass and hot lesion on PET in the right liver. He started having problems in December after his family had a GI bug. He had an episode of severe crampy abdominal pain and diarrhea. He was seen at urgent care by a PA and reportedly his labs were OK. CT was recommended. He had another episode of pain a few days later and was seen at [facility]. Felt like maybe just gastroenteritis. However, he continued not to feel well and followed up with his PCP. Due to the duration of his symptoms, a CT was performed at [facility] showing thickening of colon, apple core like lesion in ascending colon, and 1.7 cm right inferior hepatic lobe mass. He was referred to [name].   He noted that his appetite had decreased. He has lost around 30 pounds in the last 2 months. He was planning on going straight for chemo, but developed additional cramping pain and belching. The small bowel was dilated as well. He has been on full liquids for the last 5 days or so with improvement in symptoms. He has had some diarrhea during this time as well. He denies rectal bleeding.   PET showed right colon mass, liver mass and LAD.   Of note, he had colonoscopy by Dr. Ladona Horns in december 2016 with several polyps including one with cancer. This was done at [facility].   Colonoscopy by Dr. Ladona Horns. It showed showed 2 sessile polyps ready between 3-5 mm in size located 20 cm (A, B) from the point of entry, polypectomy was performed. Pedunculated polyp was found in the ascending colon (C), polypectomy was performed, and  additional polyp (D) was found 30 cm from the point of entry, removed  PET IMPRESSION: Approximately 3 cm hypermetabolic mass in the ascending colon, consistent with primary colon carcinoma. This mass results in colonic obstruction and small bowel dilatation. Additional areas of hypermetabolic wall thickening in the cecum may represent other sites of colon carcinoma or colitis .  Mild hypermetabolic lymphadenopathy in right pericolonic region, porta hepatis, and aortocaval space, consistent with metastatic disease. Mild hypermetabolic mediastinal lymphadenopathy also seen, and thoracic lymph node metastases cannot be excluded.  Solitary hypermetabolic focus in inferior right hepatic lobe, consistent with liver metastasis. Consider abdomen MRI without and with contrast for further evaluation.    Past Surgical History Nicholas Lowery, [place]; 06/12/2016 3:19 PM) Appendectomy Knee Surgery Right. Open Inguinal Hernia Surgery Right. Vasectomy  Diagnostic Studies History Nicholas Lowery, [place]; 06/12/2016 3:19 PM) Colonoscopy 1-5 years ago  Allergies Nicholas Lowery, [place]; 06/12/2016 3:20 PM) Penicillins  Medication History Nicholas Lowery, CMA; 06/12/2016 3:22 PM) Celecoxib (200MG  Capsule, Oral daily) Active. Ondansetron (8MG  Tablet Disint, Oral as needed) Active. Promethazine HCl (25MG  Tablet, Oral as needed) Active. FLUoxetine HCl (40MG  Capsule, Oral daily) Active. Diphenoxylate-Atropine (2.5-0.025MG  Tablet, Oral as needed) Active. Colace (100MG  Capsule, Oral daily) Active. Iron (325 (65 Fe)MG Tablet, Oral daily) Active. Multivitamin (Oral daily) Active. Probiotic Product (Oral daily) Active. Medications Reconciled  Social History Nicholas Lowery, CMA; 06/12/2016 3:19 PM) Alcohol use Moderate alcohol use. Caffeine use Carbonated beverages, Tea. Illicit drug use Remotely quit drug  use.  Family History Nicholas Lowery, Nicholas Lowery; 06/12/2016 3:19 PM) Cancer  Mother. Cerebrovascular Accident Mother. Diabetes Mellitus Brother. Hypertension Father. Kidney Disease Father. Respiratory Condition Mother.  Other Problems Nicholas Lowery, [place]; 06/12/2016 3:19 PM) Colon Cancer Inguinal Hernia     Review of Systems Nicholas Lowery CMA; 06/12/2016 3:19 PM) General Present- Appetite Loss and Weight Loss. Not Present- Chills, Fatigue, Fever, Night Sweats and Weight Gain. Skin Present- Dryness. Not Present- Change in Wart/Mole, Hives, Jaundice, New Lesions, Non-Healing Wounds, Rash and Ulcer. HEENT Present- Seasonal Allergies. Not Present- Earache, Hearing Loss, Hoarseness, Nose Bleed, Oral Ulcers, Ringing in the Ears, Sinus Pain, Sore Throat, Visual Disturbances, Wears glasses/contact lenses and Yellow Eyes. Respiratory Present- Snoring. Not Present- Bloody sputum, Chronic Cough, Difficulty Breathing and Wheezing. Cardiovascular Not Present- Chest Pain, Difficulty Breathing Lying Down, Leg Cramps, Palpitations, Rapid Heart Rate, Shortness of Breath and Swelling of Extremities. Gastrointestinal Present- Abdominal Pain, Bloating, Bloody Stool and Change in Bowel Habits. Not Present- Chronic diarrhea, Constipation, Difficulty Swallowing, Excessive gas, Gets full quickly at meals, Hemorrhoids, Indigestion, Nausea, Rectal Pain and Vomiting. Male Genitourinary Not Present- Blood in Urine, Change in Urinary Stream, Frequency, Impotence, Nocturia, Painful Urination, Urgency and Urine Leakage. Musculoskeletal Not Present- Back Pain, Joint Pain, Joint Stiffness, Muscle Pain, Muscle Weakness and Swelling of Extremities. Neurological Not Present- Decreased Memory, Fainting, Headaches, Numbness, Seizures, Tingling, Tremor, Trouble walking and Weakness. Psychiatric Present- Anxiety. Not Present- Bipolar, Change in Sleep Pattern, Depression, Fearful and Frequent crying. Endocrine Not Present- Cold Intolerance, Excessive Hunger, Hair Changes, Heat Intolerance, Hot flashes  and New Diabetes. Hematology Not Present- Blood Thinners, Easy Bruising, Excessive bleeding, Gland problems, HIV and Persistent Infections.  Vitals Nicholas Lowery CMA; 06/12/2016 3:22 PM) 06/12/2016 3:22 PM Weight: 193.2 lb Height: 71in Height was reported by patient. Body Surface Area: 2.08 m Body Mass Index: 26.95 kg/m  Temp.: 97.54F  Pulse: 79 (Regular)  BP: 142/92 (Sitting, Left Arm, Standard)      Physical Exam [name] MD; 06/12/2016 6:13 PM)  General Mental Status-Alert. General Appearance-Consistent with stated age. Hydration-Well hydrated. Voice-Normal.  Head and Neck Head-normocephalic, atraumatic with no lesions or palpable masses. Trachea-midline. Thyroid Gland Characteristics - normal size and consistency.  Eye Eyeball - Bilateral-Extraocular movements intact. Sclera/Conjunctiva - Bilateral-No scleral icterus.  Chest and Lung Exam Chest and lung exam reveals -quiet, even and easy respiratory effort with no use of accessory muscles and on auscultation, normal breath sounds, no adventitious sounds and normal vocal resonance. Inspection Chest Wall - Normal. Back - normal.  Cardiovascular Cardiovascular examination reveals -normal heart sounds, regular rate and rhythm with no murmurs and normal pedal pulses bilaterally.  Abdomen Inspection Inspection of the abdomen reveals - No Hernias. Palpation/Percussion Palpation and Percussion of the abdomen reveal - Soft, Non Tender, No Rebound tenderness, No Rigidity (guarding) and No hepatosplenomegaly. Auscultation Auscultation of the abdomen reveals - Bowel sounds normal.  Neurologic Neurologic evaluation reveals -alert and oriented x 3 with no impairment of recent or remote memory. Mental Status-Normal.  Musculoskeletal Global Assessment -Note:no gross deformities.  Normal Exam - Left-Upper Extremity Strength Normal and Lower Extremity Strength Normal. Normal  Exam - Right-Upper Extremity Strength Normal and Lower Extremity Strength Normal.  Lymphatic Head & Neck  General Head & Neck Lymphatics: Bilateral - Description - Normal. Axillary  General Axillary Region: Bilateral - Description - Normal. Tenderness - Non Tender. Femoral & Inguinal  Generalized Femoral & Inguinal Lymphatics: Bilateral - Description - No Generalized lymphadenopathy.    Assessment & Plan [name] MD;  06/12/2016 6:24 PM)  COLON CANCER, ASCENDING (C18.2) Impression: Due to patient's obstructive symptoms, we will plan lap ascending colectomy. If possible we will do intraoperative liver biopsy. If not visible at time of operation, I will order liver biopsy in house with IR.  I reviewed pros and cons of up front resection of the primary vs staying on liquids and doing chemo first. Overall, I think since he had weight loss, dilation of small bowel and such significant cramping with regular diet that he would benefit from colectomy first.  Since he has been on liquids for 5 days, I am just going to do a few suppositories for bowel prep. I do not want him to try to drink a large burden of fluid while partially obstructed.  I reviewed surgery with the patient. I will make all efforts to complete this laparoscopically. However I informed him that sometimes the level of lymphatic involvement can lead to difficulty getting the blood vessels controlled laparoscopically. I informed him that I would do hand port or open if needed to get out the mass safely. I discussed that the abdominal wall may be involved and if so, he might be recommended to get radiation.  I also reviewed risks of leak, bleeding, infection, damage to adjacent structures, hernia, wound dehiscence, possible need for additional surgeries or procedures, possible ostomy, possible blood clot, heart or lung complications, urinary retention, possible death. I discussed how we approach pain control. I reviewed post op  restrictions and recovery. I also stated that I would not plan port at the same time as colon surgery due to infectious risk.  Over 60 minutes were spent in counseling with the patient and his wife. Diagrams of anatamy were reviewed.  Current Plans Pt Education - flb right colectomy: discussed with patient and provided information. Schedule for Surgery LIVER MASS, RIGHT LOBE (R16.0) Impression: I would defer decision for possible surgical treatment of the liver until later. I am concerned about the potential burden of lymphatic disease. I would recommend post op chemo and then reimage. Some of the lymphadenopathy may be inflammatory in nature. Colectomy would definitely get all the nodes resected.  However, if the aortocaval nodes are positive, this would make it unlikely to achieve a curative intent with right hepatectomy. In that case, we could consider ablation. We will discuss him in multidisciplinary fashion post op and post chemo.

## 2016-06-13 ENCOUNTER — Ambulatory Visit (HOSPITAL_COMMUNITY): Payer: BLUE CROSS/BLUE SHIELD

## 2016-06-13 ENCOUNTER — Ambulatory Visit (HOSPITAL_COMMUNITY)
Admission: RE | Admit: 2016-06-13 | Discharge: 2016-06-13 | Disposition: A | Discharge status: Home / Self Care | Payer: BLUE CROSS/BLUE SHIELD | Source: Ambulatory Visit | Attending: Hematology | Admitting: Hematology

## 2016-06-13 ENCOUNTER — Encounter (HOSPITAL_COMMUNITY): Payer: Self-pay | Admitting: *Deleted

## 2016-06-13 ENCOUNTER — Telehealth: Payer: Self-pay | Admitting: *Deleted

## 2016-06-13 MED ORDER — SODIUM CHLORIDE 0.9 % IV SOLN
INTRAVENOUS | Status: AC
  Administered 2016-06-14: 1000 mL via INTRAPERITONEAL
  Filled 2016-06-13: qty 6

## 2016-06-13 NOTE — Telephone Encounter (Signed)
Received call from pt stating that he will be having surgery tomorrow per Dr Barry Dienes for colon resection due to tumors obstructing colon.  She cancelled liver biopsy & Dr Barry Dienes will do with surgery.  Will cancel appt with Dr Burr Medico on [place].  Dr Burr Medico notified.

## 2016-06-13 NOTE — Discharge Instructions (Addendum)
Liver Biopsy, Care After °Introduction °These instructions give you information on caring for yourself after your procedure. Your doctor may also give you more specific instructions. Call your doctor if you have any problems or questions after your procedure. °Follow these instructions at home: °· Rest at home for 1-2 days or as told by your doctor. °· Have someone stay with you for at least 24 hours. °· Do not do these things in the first 24 hours: °¨ Drive. °¨ Use machinery. °¨ Take care of other people. °¨ Sign legal documents. °¨ Take a bath or shower. °· There are many different ways to close and cover a cut (incision). For example, a cut can be closed with stitches, skin glue, or adhesive strips. Follow your doctor's instructions on: °¨ Taking care of your cut. °¨ Changing and removing your bandage (dressing). °¨ Removing whatever was used to close your cut. °· Do not drink alcohol in the first week. °· Do not lift more than 5 pounds or play contact sports for the first 2 weeks. °· Take medicines only as told by your doctor. For 1 week, do not take medicine that has aspirin in it or medicines like ibuprofen. °· Get your test results. °Contact a doctor if: °· A cut bleeds and leaves more than just a small spot of blood. °· A cut is red, puffs up (swells), or hurts more than before. °· Fluid or something else comes from a cut. °· A cut smells bad. °· You have a fever or chills. °Get help right away if: °· You have swelling, bloating, or pain in your belly (abdomen). °· You get dizzy or faint. °· You have a rash. °· You feel sick to your stomach (nauseous) or throw up (vomit). °· You have trouble breathing, feel short of breath, or feel faint. °· Your chest hurts. °· You have problems talking or seeing. °· You have trouble balancing or moving your arms or legs. °This information is not intended to replace advice given to you by your health care provider. Make sure you discuss any questions you have with your  health care provider. °Document Released: 02/08/2008 Document Revised: 10/07/2015 Document Reviewed: 06/27/2013 °© 2017 Elsevier °Moderate Conscious Sedation, Adult, Care After °These instructions provide you with information about caring for yourself after your procedure. Your health care provider may also give you more specific instructions. Your treatment has been planned according to current medical practices, but problems sometimes occur. Call your health care provider if you have any problems or questions after your procedure. °What can I expect after the procedure? °After your procedure, it is common: °· To feel sleepy for several hours. °· To feel clumsy and have poor balance for several hours. °· To have poor judgment for several hours. °· To vomit if you eat too soon. °Follow these instructions at home: °For at least 24 hours after the procedure:  °· Do not: °¨ Participate in activities where you could fall or become injured. °¨ Drive. °¨ Use heavy machinery. °¨ Drink alcohol. °¨ Take sleeping pills or medicines that cause drowsiness. °¨ Make important decisions or sign legal documents. °¨ Take care of children on your own. °· Rest. °Eating and drinking °· Follow the diet recommended by your health care provider. °· If you vomit: °¨ Drink water, juice, or soup when you can drink without vomiting. °¨ Make sure you have little or no nausea before eating solid foods. °General instructions °· Have a responsible adult stay with you until   you are awake and alert. °· Take over-the-counter and prescription medicines only as told by your health care provider. °· If you smoke, do not smoke without supervision. °· Keep all follow-up visits as told by your health care provider. This is important. °Contact a health care provider if: °· You keep feeling nauseous or you keep vomiting. °· You feel light-headed. °· You develop a rash. °· You have a fever. °Get help right away if: °· You have trouble breathing. °This  information is not intended to replace advice given to you by your health care provider. Make sure you discuss any questions you have with your health care provider. °Document Released: 02/19/2013 Document Revised: 10/04/2015 Document Reviewed: 08/21/2015 °Elsevier Interactive Patient Education © 2017 Elsevier Inc. ° °

## 2016-06-13 NOTE — Progress Notes (Signed)
Pt denies SOB, chest pain, and being under the care of a cardiologist. Pt denies having a stress test, echo and cardiac cath. Pt denies having a chest x ray and EKG within the last year. Pt has recent labs in [place] (06/06/16). Pt stated that he has been on a liquid diet for the past several days; pt advised to drink plenty of liquids today before midnight to prevent dehydration. Pt made aware to stop taking  Aspirin, vitamins, fish oil and herbal medications. Do not take any NSAIDs ie: Ibuprofen, Advil, Naproxen, BC and Goody Powder or any medication containing Aspirin. Pt verbalized understanding of all pre-op instructions.

## 2016-06-13 NOTE — Telephone Encounter (Signed)
Oncology Nurse Navigator Documentation  Oncology Nurse Navigator Flowsheets 06/13/2016  Navigator Location [facility]  Referral date to RadOnc/MedOnc -  Navigator Encounter Type Telephone  Telephone Outgoing Call  Abnormal Finding Date -  Confirmed Diagnosis Date -  Patient Visit Type -  Treatment Phase -  Barriers/Navigation Needs Coordination of Care--Having surgery on 06/14/16. Reschedule OV with [name]  Education -  Interventions Coordination of Care--cancelled appointments on 06/15/16  Coordination of Care Appts--LOS to scheduler to see him week of 07/03/16 and call  Education Method -  Support Groups/Services -  Acuity -  Time Spent with Patient 15

## 2016-06-14 ENCOUNTER — Other Ambulatory Visit: Payer: Self-pay

## 2016-06-14 ENCOUNTER — Inpatient Hospital Stay (HOSPITAL_COMMUNITY): Payer: BLUE CROSS/BLUE SHIELD | Admitting: Anesthesiology

## 2016-06-14 ENCOUNTER — Inpatient Hospital Stay (HOSPITAL_COMMUNITY)
Admission: RE | Admit: 2016-06-14 | Discharge: 2016-06-18 | DRG: 330 | Disposition: A | Discharge status: Home / Self Care | Payer: BLUE CROSS/BLUE SHIELD | Source: Ambulatory Visit | Attending: General Surgery | Admitting: General Surgery

## 2016-06-14 ENCOUNTER — Encounter (HOSPITAL_COMMUNITY)
Admission: RE | Disposition: A | Discharge status: Home / Self Care | Payer: Self-pay | Source: Ambulatory Visit | Attending: General Surgery

## 2016-06-14 ENCOUNTER — Encounter (HOSPITAL_COMMUNITY): Payer: Self-pay | Admitting: *Deleted

## 2016-06-14 DIAGNOSIS — D62 Acute posthemorrhagic anemia: Secondary | ICD-10-CM | POA: Diagnosis not present

## 2016-06-14 DIAGNOSIS — Z79899 Other long term (current) drug therapy: Secondary | ICD-10-CM | POA: Diagnosis not present

## 2016-06-14 DIAGNOSIS — D500 Iron deficiency anemia secondary to blood loss (chronic): Secondary | ICD-10-CM | POA: Diagnosis present

## 2016-06-14 DIAGNOSIS — C182 Malignant neoplasm of ascending colon: Principal | ICD-10-CM | POA: Diagnosis present

## 2016-06-14 HISTORY — PX: LAPAROSCOPIC RIGHT COLECTOMY: SHX5925

## 2016-06-14 HISTORY — DX: Anxiety disorder, unspecified: F41.9

## 2016-06-14 HISTORY — DX: Malignant neoplasm of ascending colon: C18.2

## 2016-06-14 HISTORY — DX: Other seasonal allergic rhinitis: J30.2

## 2016-06-14 HISTORY — PX: LIVER BIOPSY: SHX301

## 2016-06-14 LAB — CBC
HEMATOCRIT: 34.5 % — AB (ref 39.0–52.0)
Hemoglobin: 11.2 g/dL — ABNORMAL LOW (ref 13.0–17.0)
MCH: 29.2 pg (ref 26.0–34.0)
MCHC: 32.5 g/dL (ref 30.0–36.0)
MCV: 90.1 fL (ref 78.0–100.0)
PLATELETS: 344 10*3/uL (ref 150–400)
RBC: 3.83 MIL/uL — ABNORMAL LOW (ref 4.22–5.81)
RDW: 13.4 % (ref 11.5–15.5)
WBC: 13.2 10*3/uL — ABNORMAL HIGH (ref 4.0–10.5)

## 2016-06-14 LAB — TYPE AND SCREEN
ABO/RH(D): O POS
ANTIBODY SCREEN: NEGATIVE

## 2016-06-14 LAB — CREATININE, SERUM: Creatinine, Ser: 1.3 mg/dL — ABNORMAL HIGH (ref 0.61–1.24)

## 2016-06-14 LAB — ABO/RH: ABO/RH(D): O POS

## 2016-06-14 SURGERY — COLECTOMY, RIGHT, LAPAROSCOPIC
Anesthesia: General | Site: Abdomen | Laterality: Right

## 2016-06-14 MED ORDER — HYDROMORPHONE 1 MG/ML IV SOLN
INTRAVENOUS | Status: AC
  Filled 2016-06-14: qty 25

## 2016-06-14 MED ORDER — ROCURONIUM BROMIDE 100 MG/10ML IV SOLN
INTRAVENOUS | Status: DC | PRN
  Administered 2016-06-14: 20 mg via INTRAVENOUS
  Administered 2016-06-14: 50 mg via INTRAVENOUS

## 2016-06-14 MED ORDER — ACETAMINOPHEN 650 MG RE SUPP: 650.0000 mg | Freq: Four times a day (QID) | RECTAL | Status: DC | PRN

## 2016-06-14 MED ORDER — SUCCINYLCHOLINE CHLORIDE 200 MG/10ML IV SOSY
PREFILLED_SYRINGE | INTRAVENOUS | Status: AC
Start: 2016-06-14 — End: 2016-06-14
  Filled 2016-06-14: qty 10

## 2016-06-14 MED ORDER — HYDRALAZINE HCL 20 MG/ML IJ SOLN: 10.0000 mg | INTRAMUSCULAR | Status: DC | PRN

## 2016-06-14 MED ORDER — MIDAZOLAM HCL 5 MG/5ML IJ SOLN
INTRAMUSCULAR | Status: DC | PRN
  Administered 2016-06-14: 2 mg via INTRAVENOUS

## 2016-06-14 MED ORDER — FENTANYL CITRATE (PF) 100 MCG/2ML IJ SOLN
INTRAMUSCULAR | Status: AC
  Filled 2016-06-14: qty 4

## 2016-06-14 MED ORDER — NEOSTIGMINE METHYLSULFATE 5 MG/5ML IV SOSY
PREFILLED_SYRINGE | INTRAVENOUS | Status: AC
  Filled 2016-06-14: qty 5

## 2016-06-14 MED ORDER — ONDANSETRON HCL 4 MG/2ML IJ SOLN: 4.0000 mg | Freq: Once | INTRAMUSCULAR | Status: DC | PRN

## 2016-06-14 MED ORDER — ONDANSETRON HCL 4 MG/2ML IJ SOLN
4.0000 mg | Freq: Four times a day (QID) | INTRAMUSCULAR | Status: DC | PRN
  Administered 2016-06-16: 4 mg via INTRAVENOUS
  Filled 2016-06-14: qty 2

## 2016-06-14 MED ORDER — OXYCODONE HCL 5 MG/5ML PO SOLN: 5.0000 mg | Freq: Once | ORAL | Status: DC | PRN

## 2016-06-14 MED ORDER — NEOSTIGMINE METHYLSULFATE 10 MG/10ML IV SOLN
INTRAVENOUS | Status: DC | PRN
  Administered 2016-06-14: 3 mg via INTRAVENOUS

## 2016-06-14 MED ORDER — ACETAMINOPHEN 325 MG PO TABS
650.0000 mg | ORAL_TABLET | Freq: Four times a day (QID) | ORAL | Status: DC | PRN
  Filled 2016-06-14: qty 2

## 2016-06-14 MED ORDER — LABETALOL HCL 5 MG/ML IV SOLN
INTRAVENOUS | Status: AC
  Filled 2016-06-14: qty 4

## 2016-06-14 MED ORDER — KCL IN DEXTROSE-NACL 20-5-0.45 MEQ/L-%-% IV SOLN
INTRAVENOUS | Status: AC
  Filled 2016-06-14: qty 1000

## 2016-06-14 MED ORDER — 0.9 % SODIUM CHLORIDE (POUR BTL) OPTIME
TOPICAL | Status: DC | PRN
  Administered 2016-06-14 (×4): 1000 mL

## 2016-06-14 MED ORDER — DIPHENHYDRAMINE HCL 50 MG/ML IJ SOLN: 12.5000 mg | Freq: Four times a day (QID) | INTRAMUSCULAR | Status: DC | PRN

## 2016-06-14 MED ORDER — DEXMEDETOMIDINE HCL IN NACL 200 MCG/50ML IV SOLN
INTRAVENOUS | Status: AC
  Filled 2016-06-14: qty 50

## 2016-06-14 MED ORDER — SODIUM CHLORIDE 0.9% FLUSH: 9.0000 mL | INTRAVENOUS | Status: DC | PRN

## 2016-06-14 MED ORDER — FENTANYL CITRATE (PF) 100 MCG/2ML IJ SOLN
INTRAMUSCULAR | Status: DC | PRN
  Administered 2016-06-14: 100 ug via INTRAVENOUS
  Administered 2016-06-14 (×3): 50 ug via INTRAVENOUS
  Administered 2016-06-14: 100 ug via INTRAVENOUS
  Administered 2016-06-14: 50 ug via INTRAVENOUS
  Administered 2016-06-14 (×2): 100 ug via INTRAVENOUS

## 2016-06-14 MED ORDER — KCL IN DEXTROSE-NACL 20-5-0.45 MEQ/L-%-% IV SOLN
INTRAVENOUS | Status: DC
  Administered 2016-06-15 – 2016-06-18 (×8): via INTRAVENOUS
  Filled 2016-06-14 (×8): qty 1000

## 2016-06-14 MED ORDER — LIDOCAINE 2% (20 MG/ML) 5 ML SYRINGE
INTRAMUSCULAR | Status: AC
  Filled 2016-06-14: qty 5

## 2016-06-14 MED ORDER — BUPIVACAINE ON-Q PAIN PUMP (FOR ORDER SET NO CHG)
INJECTION | Status: DC
  Filled 2016-06-14: qty 1

## 2016-06-14 MED ORDER — KETOROLAC TROMETHAMINE 30 MG/ML IJ SOLN
30.0000 mg | Freq: Four times a day (QID) | INTRAMUSCULAR | Status: AC
  Administered 2016-06-14 – 2016-06-15 (×4): 30 mg via INTRAVENOUS
  Filled 2016-06-14 (×4): qty 1

## 2016-06-14 MED ORDER — ACETAMINOPHEN 500 MG PO TABS
1000.0000 mg | ORAL_TABLET | Freq: Four times a day (QID) | ORAL | Status: DC
  Administered 2016-06-14 – 2016-06-18 (×15): 1000 mg via ORAL
  Filled 2016-06-14 (×15): qty 2

## 2016-06-14 MED ORDER — ENOXAPARIN SODIUM 40 MG/0.4ML ~~LOC~~ SOLN
40.0000 mg | SUBCUTANEOUS | Status: DC
Start: 2016-06-15 — End: 2016-06-16
  Administered 2016-06-15 – 2016-06-16 (×2): 40 mg via SUBCUTANEOUS
  Filled 2016-06-14 (×2): qty 0.4

## 2016-06-14 MED ORDER — BUPIVACAINE-EPINEPHRINE (PF) 0.5% -1:200000 IJ SOLN
INTRAMUSCULAR | Status: AC
  Filled 2016-06-14: qty 30

## 2016-06-14 MED ORDER — BUPIVACAINE HCL (PF) 0.25 % IJ SOLN
INTRAMUSCULAR | Status: DC | PRN
  Administered 2016-06-14: 4 mL

## 2016-06-14 MED ORDER — LIDOCAINE HCL 1 % IJ SOLN
INTRAMUSCULAR | Status: DC | PRN
  Administered 2016-06-14: 4 mL via INTRADERMAL

## 2016-06-14 MED ORDER — DEXMEDETOMIDINE HCL 200 MCG/2ML IV SOLN
INTRAVENOUS | Status: DC | PRN
  Administered 2016-06-14 (×3): 20 ug via INTRAVENOUS

## 2016-06-14 MED ORDER — LACTATED RINGERS IV SOLN
INTRAVENOUS | Status: DC
  Administered 2016-06-14 (×4): via INTRAVENOUS

## 2016-06-14 MED ORDER — LIDOCAINE HCL (PF) 1 % IJ SOLN
INTRAMUSCULAR | Status: AC
  Filled 2016-06-14: qty 30

## 2016-06-14 MED ORDER — ZOLPIDEM TARTRATE 5 MG PO TABS: 5.0000 mg | ORAL_TABLET | Freq: Every evening | ORAL | Status: DC | PRN

## 2016-06-14 MED ORDER — CHLORHEXIDINE GLUCONATE CLOTH 2 % EX PADS: 6.0000 | MEDICATED_PAD | Freq: Once | CUTANEOUS | Status: DC

## 2016-06-14 MED ORDER — GLYCOPYRROLATE 0.2 MG/ML IJ SOLN
INTRAMUSCULAR | Status: DC | PRN
  Administered 2016-06-14: 0.4 mg via INTRAVENOUS

## 2016-06-14 MED ORDER — HYDROMORPHONE HCL 1 MG/ML IJ SOLN
0.2500 mg | INTRAMUSCULAR | Status: DC | PRN
  Administered 2016-06-14: 0.5 mg via INTRAVENOUS

## 2016-06-14 MED ORDER — BUPIVACAINE 0.25 % ON-Q PUMP DUAL CATH 300 ML
300.0000 mL | INJECTION | Status: DC
  Filled 2016-06-14: qty 300

## 2016-06-14 MED ORDER — MIDAZOLAM HCL 2 MG/2ML IJ SOLN
INTRAMUSCULAR | Status: AC
  Filled 2016-06-14: qty 2

## 2016-06-14 MED ORDER — BUPIVACAINE HCL (PF) 0.25 % IJ SOLN
INTRAMUSCULAR | Status: AC
  Filled 2016-06-14: qty 30

## 2016-06-14 MED ORDER — PROPOFOL 10 MG/ML IV BOLUS
INTRAVENOUS | Status: DC | PRN
  Administered 2016-06-14: 180 mg via INTRAVENOUS

## 2016-06-14 MED ORDER — METHOCARBAMOL 500 MG PO TABS: 500.0000 mg | ORAL_TABLET | Freq: Four times a day (QID) | ORAL | Status: DC | PRN

## 2016-06-14 MED ORDER — KETOROLAC TROMETHAMINE 30 MG/ML IJ SOLN
30.0000 mg | Freq: Four times a day (QID) | INTRAMUSCULAR | Status: DC | PRN
  Administered 2016-06-17: 30 mg via INTRAVENOUS
  Filled 2016-06-14: qty 1

## 2016-06-14 MED ORDER — CIPROFLOXACIN IN D5W 400 MG/200ML IV SOLN
400.0000 mg | Freq: Two times a day (BID) | INTRAVENOUS | Status: AC
  Administered 2016-06-14: 400 mg via INTRAVENOUS
  Filled 2016-06-14: qty 200

## 2016-06-14 MED ORDER — FENTANYL CITRATE (PF) 100 MCG/2ML IJ SOLN
INTRAMUSCULAR | Status: AC
  Filled 2016-06-14: qty 2

## 2016-06-14 MED ORDER — PROPOFOL 10 MG/ML IV BOLUS
INTRAVENOUS | Status: AC
  Filled 2016-06-14: qty 20

## 2016-06-14 MED ORDER — ALVIMOPAN 12 MG PO CAPS
ORAL_CAPSULE | ORAL | Status: AC
  Administered 2016-06-14: 12 mg via ORAL
  Filled 2016-06-14: qty 1

## 2016-06-14 MED ORDER — CLINDAMYCIN PHOSPHATE 900 MG/50ML IV SOLN
INTRAVENOUS | Status: DC | PRN
  Administered 2016-06-14: 900 mg via INTRAVENOUS

## 2016-06-14 MED ORDER — ONDANSETRON HCL 4 MG/2ML IJ SOLN
INTRAMUSCULAR | Status: AC
  Filled 2016-06-14: qty 4

## 2016-06-14 MED ORDER — CLINDAMYCIN PHOSPHATE 900 MG/50ML IV SOLN
INTRAVENOUS | Status: AC
  Filled 2016-06-14: qty 50

## 2016-06-14 MED ORDER — ALVIMOPAN 12 MG PO CAPS
12.0000 mg | ORAL_CAPSULE | Freq: Once | ORAL | Status: AC
  Administered 2016-06-14: 12 mg via ORAL

## 2016-06-14 MED ORDER — ONDANSETRON 4 MG PO TBDP: 4.0000 mg | ORAL_TABLET | Freq: Four times a day (QID) | ORAL | Status: DC | PRN

## 2016-06-14 MED ORDER — ONDANSETRON HCL 4 MG/2ML IJ SOLN: 4.0000 mg | Freq: Four times a day (QID) | INTRAMUSCULAR | Status: DC | PRN

## 2016-06-14 MED ORDER — OXYCODONE HCL 5 MG PO TABS: 5.0000 mg | ORAL_TABLET | Freq: Once | ORAL | Status: DC | PRN

## 2016-06-14 MED ORDER — METRONIDAZOLE 500 MG PO TABS
500.0000 mg | ORAL_TABLET | Freq: Three times a day (TID) | ORAL | Status: DC
  Administered 2016-06-14 – 2016-06-18 (×11): 500 mg via ORAL
  Filled 2016-06-14 (×11): qty 1

## 2016-06-14 MED ORDER — SUCCINYLCHOLINE CHLORIDE 20 MG/ML IJ SOLN
INTRAMUSCULAR | Status: DC | PRN
  Administered 2016-06-14: 100 mg via INTRAVENOUS

## 2016-06-14 MED ORDER — DIPHENHYDRAMINE HCL 12.5 MG/5ML PO ELIX: 12.5000 mg | ORAL_SOLUTION | Freq: Four times a day (QID) | ORAL | Status: DC | PRN

## 2016-06-14 MED ORDER — SODIUM CHLORIDE 0.9 % IR SOLN
Status: DC | PRN
  Administered 2016-06-14: 1000 mL

## 2016-06-14 MED ORDER — FLUOXETINE HCL 20 MG PO CAPS
40.0000 mg | ORAL_CAPSULE | Freq: Every day | ORAL | Status: DC
  Administered 2016-06-14 – 2016-06-18 (×5): 40 mg via ORAL
  Filled 2016-06-14 (×5): qty 2

## 2016-06-14 MED ORDER — HYDROMORPHONE HCL 1 MG/ML IJ SOLN
INTRAMUSCULAR | Status: AC
  Administered 2016-06-14: 0.5 mg via INTRAVENOUS
  Filled 2016-06-14: qty 0.5

## 2016-06-14 MED ORDER — BUPIVACAINE 0.25 % ON-Q PUMP DUAL CATH 300 ML
INJECTION | Status: DC | PRN
  Administered 2016-06-14: 300 mL

## 2016-06-14 MED ORDER — ROCURONIUM BROMIDE 50 MG/5ML IV SOSY
PREFILLED_SYRINGE | INTRAVENOUS | Status: AC
  Filled 2016-06-14: qty 10

## 2016-06-14 MED ORDER — HYDROMORPHONE 1 MG/ML IV SOLN
INTRAVENOUS | Status: DC
  Administered 2016-06-14: 1.5 mg via INTRAVENOUS
  Administered 2016-06-14: 17:00:00 via INTRAVENOUS
  Administered 2016-06-15: 0.3 mg via INTRAVENOUS
  Administered 2016-06-15: 0.6 mg via INTRAVENOUS
  Administered 2016-06-15: 1.5 mg via INTRAVENOUS
  Administered 2016-06-15: 0.6 mg via INTRAVENOUS
  Administered 2016-06-15: 0.3 mg via INTRAVENOUS
  Administered 2016-06-15: 1.5 mg via INTRAVENOUS
  Administered 2016-06-16 (×2): 0.6 mg via INTRAVENOUS
  Administered 2016-06-16 (×3): 0.3 mg via INTRAVENOUS
  Administered 2016-06-17: 0.6 mg via INTRAVENOUS
  Administered 2016-06-17: 0 mg via INTRAVENOUS
  Filled 2016-06-14: qty 25

## 2016-06-14 MED ORDER — LABETALOL HCL 5 MG/ML IV SOLN
INTRAVENOUS | Status: DC | PRN
  Administered 2016-06-14 (×2): 5 mg via INTRAVENOUS

## 2016-06-14 MED ORDER — HYDROMORPHONE HCL 2 MG/ML IJ SOLN: 0.5000 mg | INTRAMUSCULAR | Status: DC | PRN

## 2016-06-14 MED ORDER — NALOXONE HCL 0.4 MG/ML IJ SOLN: 0.4000 mg | INTRAMUSCULAR | Status: DC | PRN

## 2016-06-14 MED ORDER — ONDANSETRON HCL 4 MG/2ML IJ SOLN
INTRAMUSCULAR | Status: DC | PRN
  Administered 2016-06-14 (×2): 4 mg via INTRAVENOUS

## 2016-06-14 MED ORDER — SIMETHICONE 80 MG PO CHEW: 40.0000 mg | CHEWABLE_TABLET | Freq: Four times a day (QID) | ORAL | Status: DC | PRN

## 2016-06-14 MED ORDER — OXYCODONE HCL 5 MG PO TABS: 5.0000 mg | ORAL_TABLET | ORAL | Status: DC | PRN

## 2016-06-14 MED ORDER — LIDOCAINE HCL (CARDIAC) 20 MG/ML IV SOLN
INTRAVENOUS | Status: DC | PRN
  Administered 2016-06-14: 40 mg via INTRAVENOUS

## 2016-06-14 SURGICAL SUPPLY — 99 items
APPLIER CLIP 5 13 M/L LIGAMAX5 (MISCELLANEOUS)
APPLIER CLIP ROT 10 11.4 M/L (STAPLE)
BAG DECANTER FOR FLEXI CONT (MISCELLANEOUS) ×4 IMPLANT
BLADE SURG 10 STRL SS (BLADE) ×4 IMPLANT
BLADE SURG ROTATE 9660 (MISCELLANEOUS) IMPLANT
CANISTER SUCTION 2500CC (MISCELLANEOUS) ×4 IMPLANT
CATH KIT ON-Q SILVERSOAK 5IN (CATHETERS) ×8 IMPLANT
CELLS DAT CNTRL 66122 CELL SVR (MISCELLANEOUS) ×2 IMPLANT
CHLORAPREP W/TINT 26ML (MISCELLANEOUS) ×4 IMPLANT
CLIP APPLIE 5 13 M/L LIGAMAX5 (MISCELLANEOUS) IMPLANT
CLIP APPLIE ROT 10 11.4 M/L (STAPLE) IMPLANT
COVER MAYO STAND STRL (DRAPES) ×4 IMPLANT
COVER SURGICAL LIGHT HANDLE (MISCELLANEOUS) ×8 IMPLANT
DECANTER SPIKE VIAL GLASS SM (MISCELLANEOUS) ×8 IMPLANT
DRAPE PROXIMA HALF (DRAPES) ×4 IMPLANT
DRAPE UTILITY XL STRL (DRAPES) ×20 IMPLANT
DRAPE WARM FLUID 44X44 (DRAPE) ×4 IMPLANT
DRSG OPSITE POSTOP 4X10 (GAUZE/BANDAGES/DRESSINGS) IMPLANT
DRSG OPSITE POSTOP 4X8 (GAUZE/BANDAGES/DRESSINGS) ×4 IMPLANT
DRSG TEGADERM 2-3/8X2-3/4 SM (GAUZE/BANDAGES/DRESSINGS) ×16 IMPLANT
DRSG TELFA 3X8 NADH (GAUZE/BANDAGES/DRESSINGS) ×4 IMPLANT
ELECT BLADE 6.5 EXT (BLADE) ×4 IMPLANT
ELECT CAUTERY BLADE 6.4 (BLADE) ×12 IMPLANT
ELECT REM PT RETURN 9FT ADLT (ELECTROSURGICAL) ×4
ELECTRODE REM PT RTRN 9FT ADLT (ELECTROSURGICAL) ×2 IMPLANT
GAUZE SPONGE 2X2 8PLY STRL LF (GAUZE/BANDAGES/DRESSINGS) ×2 IMPLANT
GEL ULTRASOUND 20GR AQUASONIC (MISCELLANEOUS) IMPLANT
GLOVE BIO SURGEON STRL SZ 6 (GLOVE) ×16 IMPLANT
GLOVE BIO SURGEON STRL SZ8 (GLOVE) ×4 IMPLANT
GLOVE BIOGEL PI IND STRL 6.5 (GLOVE) ×4 IMPLANT
GLOVE BIOGEL PI IND STRL 7.0 (GLOVE) ×4 IMPLANT
GLOVE BIOGEL PI IND STRL 7.5 (GLOVE) ×4 IMPLANT
GLOVE BIOGEL PI IND STRL 8.5 (GLOVE) ×2 IMPLANT
GLOVE BIOGEL PI INDICATOR 6.5 (GLOVE) ×4
GLOVE BIOGEL PI INDICATOR 7.0 (GLOVE) ×4
GLOVE BIOGEL PI INDICATOR 7.5 (GLOVE) ×4
GLOVE BIOGEL PI INDICATOR 8.5 (GLOVE) ×2
GLOVE ECLIPSE 7.5 STRL STRAW (GLOVE) ×8 IMPLANT
GLOVE SURG SS PI 7.0 STRL IVOR (GLOVE) ×8 IMPLANT
GOWN STRL REUS W/ TWL LRG LVL3 (GOWN DISPOSABLE) ×18 IMPLANT
GOWN STRL REUS W/ TWL XL LVL3 (GOWN DISPOSABLE) ×2 IMPLANT
GOWN STRL REUS W/TWL 2XL LVL3 (GOWN DISPOSABLE) ×8 IMPLANT
GOWN STRL REUS W/TWL LRG LVL3 (GOWN DISPOSABLE) ×18
GOWN STRL REUS W/TWL XL LVL3 (GOWN DISPOSABLE) ×2
KIT BASIN OR (CUSTOM PROCEDURE TRAY) ×4 IMPLANT
KIT ROOM TURNOVER OR (KITS) ×4 IMPLANT
L-HOOK LAP DISP 36CM (ELECTROSURGICAL) ×4
LEGGING LITHOTOMY PAIR STRL (DRAPES) IMPLANT
LHOOK LAP DISP 36CM (ELECTROSURGICAL) ×2 IMPLANT
LIGASURE IMPACT 36 18CM CVD LR (INSTRUMENTS) IMPLANT
NEEDLE BIOPSY 14X6 SOFT TISS (NEEDLE) ×4 IMPLANT
NS IRRIG 1000ML POUR BTL (IV SOLUTION) ×8 IMPLANT
PAD ARMBOARD 7.5X6 YLW CONV (MISCELLANEOUS) ×8 IMPLANT
PENCIL BUTTON HOLSTER BLD 10FT (ELECTRODE) ×16 IMPLANT
RELOAD PROXIMATE 75MM BLUE (ENDOMECHANICALS) ×24 IMPLANT
RELOAD PROXIMATE TA60MM BLUE (ENDOMECHANICALS) ×4 IMPLANT
RTRCTR WOUND ALEXIS 18CM MED (MISCELLANEOUS) ×4
SCISSORS LAP 5X35 DISP (ENDOMECHANICALS) ×4 IMPLANT
SEALER TISSUE G2 STRG ARTC 35C (ENDOMECHANICALS) IMPLANT
SET IRRIG TUBING LAPAROSCOPIC (IRRIGATION / IRRIGATOR) ×4 IMPLANT
SHEARS HARMONIC HDI 36CM (ELECTROSURGICAL) ×4 IMPLANT
SLEEVE ENDOPATH XCEL 5M (ENDOMECHANICALS) ×12 IMPLANT
SLEEVE SURGEON STRL (DRAPES) ×4 IMPLANT
SPECIMEN JAR LARGE (MISCELLANEOUS) ×4 IMPLANT
SPONGE GAUZE 2X2 STER 10/PKG (GAUZE/BANDAGES/DRESSINGS) ×2
SPONGE LAP 18X18 X RAY DECT (DISPOSABLE) ×16 IMPLANT
STAPLER GUN LINEAR PROX 60 (STAPLE) ×4 IMPLANT
STAPLER PROXIMATE 75MM BLUE (STAPLE) ×4 IMPLANT
STAPLER VISISTAT 35W (STAPLE) ×4 IMPLANT
SUCTION POOLE TIP (SUCTIONS) ×4 IMPLANT
SURGILUBE 2OZ TUBE FLIPTOP (MISCELLANEOUS) IMPLANT
SUT PDS AB 1 CT  36 (SUTURE) ×4
SUT PDS AB 1 CT 36 (SUTURE) ×4 IMPLANT
SUT PDS AB 1 TP1 54 (SUTURE) IMPLANT
SUT PDS AB 1 TP1 96 (SUTURE) ×8 IMPLANT
SUT PROLENE 2 0 CT2 30 (SUTURE) IMPLANT
SUT PROLENE 2 0 KS (SUTURE) IMPLANT
SUT VIC AB 2-0 SH 18 (SUTURE) ×8 IMPLANT
SUT VIC AB 3-0 SH 18 (SUTURE) ×8 IMPLANT
SUT VICRYL AB 2 0 TIES (SUTURE) ×4 IMPLANT
SUT VICRYL AB 3 0 TIES (SUTURE) ×4 IMPLANT
SYR BULB IRRIGATION 50ML (SYRINGE) ×4 IMPLANT
SYS LAPSCP GELPORT 120MM (MISCELLANEOUS) ×4
SYSTEM LAPSCP GELPORT 120MM (MISCELLANEOUS) ×2 IMPLANT
TOWEL OR 17X24 6PK STRL BLUE (TOWEL DISPOSABLE) ×4 IMPLANT
TOWEL OR 17X26 10 PK STRL BLUE (TOWEL DISPOSABLE) ×8 IMPLANT
TRAY FOLEY CATH 16FRSI W/METER (SET/KITS/TRAYS/PACK) IMPLANT
TRAY LAPAROSCOPIC MC (CUSTOM PROCEDURE TRAY) ×4 IMPLANT
TRAY PROCTOSCOPIC FIBER OPTIC (SET/KITS/TRAYS/PACK) IMPLANT
TROCAR XCEL 12X100 BLDLESS (ENDOMECHANICALS) IMPLANT
TROCAR XCEL BLUNT TIP 100MML (ENDOMECHANICALS) IMPLANT
TROCAR XCEL NON-BLD 11X100MML (ENDOMECHANICALS) IMPLANT
TROCAR XCEL NON-BLD 5MMX100MML (ENDOMECHANICALS) ×4 IMPLANT
TUBE CONNECTING 12'X1/4 (SUCTIONS) ×3
TUBE CONNECTING 12X1/4 (SUCTIONS) ×9 IMPLANT
TUBING FILTER THERMOFLATOR (ELECTROSURGICAL) ×4 IMPLANT
TUBING INSUFFLATION (TUBING) ×4 IMPLANT
TUNNELER SHEATH ON-Q 16GX12 DP (PAIN MANAGEMENT) ×4 IMPLANT
YANKAUER SUCT BULB TIP NO VENT (SUCTIONS) ×8 IMPLANT

## 2016-06-14 NOTE — H&P (View-Only) (Signed)
Nicholas Lowery 06/12/2016 3:19 PM Location: [facility] Patient #: [id number] DOB: 1960/10/26 Married / Language: [name] / Race: White Male  History of Present Illness [name] MD; 06/12/2016 6:25 PM) The patient is a 56 year old male who presents with colorectal cancer. Pt is a 56 yo M referred for consultation by [name] for new diagnosis of colon cancer with near obstructing ascending colon mass and hot lesion on PET in the right liver. He started having problems in December after his family had a GI bug. He had an episode of severe crampy abdominal pain and diarrhea. He was seen at urgent care by a PA and reportedly his labs were OK. CT was recommended. He had another episode of pain a few days later and was seen at [facility]. Felt like maybe just gastroenteritis. However, he continued not to feel well and followed up with his PCP. Due to the duration of his symptoms, a CT was performed at [facility] showing thickening of colon, apple core like lesion in ascending colon, and 1.7 cm right inferior hepatic lobe mass. He was referred to [name].   He noted that his appetite had decreased. He has lost around 30 pounds in the last 2 months. He was planning on going straight for chemo, but developed additional cramping pain and belching. The small bowel was dilated as well. He has been on full liquids for the last 5 days or so with improvement in symptoms. He has had some diarrhea during this time as well. He denies rectal bleeding.   PET showed right colon mass, liver mass and LAD.   Of note, he had colonoscopy by Dr. Ladona Horns in december 2016 with several polyps including one with cancer. This was done at [facility].   Colonoscopy by Dr. Ladona Horns. It showed showed 2 sessile polyps ready between 3-5 mm in size located 20 cm (A, B) from the point of entry, polypectomy was performed. Pedunculated polyp was found in the ascending colon (C), polypectomy was performed, and  additional polyp (D) was found 30 cm from the point of entry, removed  PET IMPRESSION: Approximately 3 cm hypermetabolic mass in the ascending colon, consistent with primary colon carcinoma. This mass results in colonic obstruction and small bowel dilatation. Additional areas of hypermetabolic wall thickening in the cecum may represent other sites of colon carcinoma or colitis .  Mild hypermetabolic lymphadenopathy in right pericolonic region, porta hepatis, and aortocaval space, consistent with metastatic disease. Mild hypermetabolic mediastinal lymphadenopathy also seen, and thoracic lymph node metastases cannot be excluded.  Solitary hypermetabolic focus in inferior right hepatic lobe, consistent with liver metastasis. Consider abdomen MRI without and with contrast for further evaluation.    Past Surgical History Patsey Berthold, [place]; 06/12/2016 3:19 PM) Appendectomy Knee Surgery Right. Open Inguinal Hernia Surgery Right. Vasectomy  Diagnostic Studies History Patsey Berthold, [place]; 06/12/2016 3:19 PM) Colonoscopy 1-5 years ago  Allergies Patsey Berthold, [place]; 06/12/2016 3:20 PM) Penicillins  Medication History Patsey Berthold, CMA; 06/12/2016 3:22 PM) Celecoxib (200MG  Capsule, Oral daily) Active. Ondansetron (8MG  Tablet Disint, Oral as needed) Active. Promethazine HCl (25MG  Tablet, Oral as needed) Active. FLUoxetine HCl (40MG  Capsule, Oral daily) Active. Diphenoxylate-Atropine (2.5-0.025MG  Tablet, Oral as needed) Active. Colace (100MG  Capsule, Oral daily) Active. Iron (325 (65 Fe)MG Tablet, Oral daily) Active. Multivitamin (Oral daily) Active. Probiotic Product (Oral daily) Active. Medications Reconciled  Social History Patsey Berthold, CMA; 06/12/2016 3:19 PM) Alcohol use Moderate alcohol use. Caffeine use Carbonated beverages, Tea. Illicit drug use Remotely quit drug  use.  Family History Patsey Berthold, [place]; 06/12/2016 3:19 PM) Cancer  Mother. Cerebrovascular Accident Mother. Diabetes Mellitus Brother. Hypertension Father. Kidney Disease Father. Respiratory Condition Mother.  Other Problems Patsey Berthold, [place]; 06/12/2016 3:19 PM) Colon Cancer Inguinal Hernia     Review of Systems Patsey Berthold CMA; 06/12/2016 3:19 PM) General Present- Appetite Loss and Weight Loss. Not Present- Chills, Fatigue, Fever, Night Sweats and Weight Gain. Skin Present- Dryness. Not Present- Change in Wart/Mole, Hives, Jaundice, New Lesions, Non-Healing Wounds, Rash and Ulcer. HEENT Present- Seasonal Allergies. Not Present- Earache, Hearing Loss, Hoarseness, Nose Bleed, Oral Ulcers, Ringing in the Ears, Sinus Pain, Sore Throat, Visual Disturbances, Wears glasses/contact lenses and Yellow Eyes. Respiratory Present- Snoring. Not Present- Bloody sputum, Chronic Cough, Difficulty Breathing and Wheezing. Cardiovascular Not Present- Chest Pain, Difficulty Breathing Lying Down, Leg Cramps, Palpitations, Rapid Heart Rate, Shortness of Breath and Swelling of Extremities. Gastrointestinal Present- Abdominal Pain, Bloating, Bloody Stool and Change in Bowel Habits. Not Present- Chronic diarrhea, Constipation, Difficulty Swallowing, Excessive gas, Gets full quickly at meals, Hemorrhoids, Indigestion, Nausea, Rectal Pain and Vomiting. Male Genitourinary Not Present- Blood in Urine, Change in Urinary Stream, Frequency, Impotence, Nocturia, Painful Urination, Urgency and Urine Leakage. Musculoskeletal Not Present- Back Pain, Joint Pain, Joint Stiffness, Muscle Pain, Muscle Weakness and Swelling of Extremities. Neurological Not Present- Decreased Memory, Fainting, Headaches, Numbness, Seizures, Tingling, Tremor, Trouble walking and Weakness. Psychiatric Present- Anxiety. Not Present- Bipolar, Change in Sleep Pattern, Depression, Fearful and Frequent crying. Endocrine Not Present- Cold Intolerance, Excessive Hunger, Hair Changes, Heat Intolerance, Hot flashes  and New Diabetes. Hematology Not Present- Blood Thinners, Easy Bruising, Excessive bleeding, Gland problems, HIV and Persistent Infections.  Vitals Patsey Berthold CMA; 06/12/2016 3:22 PM) 06/12/2016 3:22 PM Weight: 193.2 lb Height: 71in Height was reported by patient. Body Surface Area: 2.08 m Body Mass Index: 26.95 kg/m  Temp.: 97.57F  Pulse: 79 (Regular)  BP: 142/92 (Sitting, Left Arm, Standard)      Physical Exam [name] MD; 06/12/2016 6:13 PM)  General Mental Status-Alert. General Appearance-Consistent with stated age. Hydration-Well hydrated. Voice-Normal.  Head and Neck Head-normocephalic, atraumatic with no lesions or palpable masses. Trachea-midline. Thyroid Gland Characteristics - normal size and consistency.  Eye Eyeball - Bilateral-Extraocular movements intact. Sclera/Conjunctiva - Bilateral-No scleral icterus.  Chest and Lung Exam Chest and lung exam reveals -quiet, even and easy respiratory effort with no use of accessory muscles and on auscultation, normal breath sounds, no adventitious sounds and normal vocal resonance. Inspection Chest Wall - Normal. Back - normal.  Cardiovascular Cardiovascular examination reveals -normal heart sounds, regular rate and rhythm with no murmurs and normal pedal pulses bilaterally.  Abdomen Inspection Inspection of the abdomen reveals - No Hernias. Palpation/Percussion Palpation and Percussion of the abdomen reveal - Soft, Non Tender, No Rebound tenderness, No Rigidity (guarding) and No hepatosplenomegaly. Auscultation Auscultation of the abdomen reveals - Bowel sounds normal.  Neurologic Neurologic evaluation reveals -alert and oriented x 3 with no impairment of recent or remote memory. Mental Status-Normal.  Musculoskeletal Global Assessment -Note:no gross deformities.  Normal Exam - Left-Upper Extremity Strength Normal and Lower Extremity Strength Normal. Normal  Exam - Right-Upper Extremity Strength Normal and Lower Extremity Strength Normal.  Lymphatic Head & Neck  General Head & Neck Lymphatics: Bilateral - Description - Normal. Axillary  General Axillary Region: Bilateral - Description - Normal. Tenderness - Non Tender. Femoral & Inguinal  Generalized Femoral & Inguinal Lymphatics: Bilateral - Description - No Generalized lymphadenopathy.    Assessment & Plan [name] MD;  06/12/2016 6:24 PM)  COLON CANCER, ASCENDING (C18.2) Impression: Due to patient's obstructive symptoms, we will plan lap ascending colectomy. If possible we will do intraoperative liver biopsy. If not visible at time of operation, I will order liver biopsy in house with IR.  I reviewed pros and cons of up front resection of the primary vs staying on liquids and doing chemo first. Overall, I think since he had weight loss, dilation of small bowel and such significant cramping with regular diet that he would benefit from colectomy first.  Since he has been on liquids for 5 days, I am just going to do a few suppositories for bowel prep. I do not want him to try to drink a large burden of fluid while partially obstructed.  I reviewed surgery with the patient. I will make all efforts to complete this laparoscopically. However I informed him that sometimes the level of lymphatic involvement can lead to difficulty getting the blood vessels controlled laparoscopically. I informed him that I would do hand port or open if needed to get out the mass safely. I discussed that the abdominal wall may be involved and if so, he might be recommended to get radiation.  I also reviewed risks of leak, bleeding, infection, damage to adjacent structures, hernia, wound dehiscence, possible need for additional surgeries or procedures, possible ostomy, possible blood clot, heart or lung complications, urinary retention, possible death. I discussed how we approach pain control. I reviewed post op  restrictions and recovery. I also stated that I would not plan port at the same time as colon surgery due to infectious risk.  Over 60 minutes were spent in counseling with the patient and his wife. Diagrams of anatamy were reviewed.  Current Plans Pt Education - flb right colectomy: discussed with patient and provided information. Schedule for Surgery LIVER MASS, RIGHT LOBE (R16.0) Impression: I would defer decision for possible surgical treatment of the liver until later. I am concerned about the potential burden of lymphatic disease. I would recommend post op chemo and then reimage. Some of the lymphadenopathy may be inflammatory in nature. Colectomy would definitely get all the nodes resected.  However, if the aortocaval nodes are positive, this would make it unlikely to achieve a curative intent with right hepatectomy. In that case, we could consider ablation. We will discuss him in multidisciplinary fashion post op and post chemo.

## 2016-06-14 NOTE — Transfer of Care (Signed)
Immediate Anesthesia Transfer of Care Note  Patient: Nicholas Lowery  Procedure(s) Performed: Procedure(s) with comments: LAPAROSCOPIC HAND ASSISTED HEMICOLECTOMY AND SMALL BOWEL RESECTION. (N/A) LIVER BIOPSY (Right) - Right Inferior Liver  Patient Location: PACU  Anesthesia Type:General  Level of Consciousness: awake, alert , oriented and patient cooperative  Airway & Oxygen Therapy: Patient Spontanous Breathing and Patient connected to nasal cannula oxygen  Post-op Assessment: Report given to RN and Post -op Vital signs reviewed and stable  Post vital signs: Reviewed and stable  Last Vitals:  Vitals:   06/14/16 1134 06/14/16 1705  BP: (!) 128/93   Pulse: 85   Resp: 18   Temp: 36.5 C (P) 36.8 C    Last Pain:  Vitals:   06/14/16 1134  TempSrc: Oral  PainSc:       Patients Stated Pain Goal: 0 ([phone]  Complications: No apparent anesthesia complications

## 2016-06-14 NOTE — Progress Notes (Signed)
Nicholas Lowery is a 56 y.o. male patient admitted From PACU. Awake, alert - oriented  X 4 - no acute distress noted.  VSS - Blood pressure (!) 136/96, pulse (!) 107, temperature 98.6 F (37 C), temperature source Oral, resp. rate 15, height 5\' 11"  (1.803 m), weight 89.5 kg (197 lb 4.8 oz), SpO2 96 %.    IV in place, occlusive dsg intact without redness.  Orientation to room, and floor completed.  Admission INP armband ID verified with patient/family, and in place.   SR up x 2, fall assessment complete, with patient and family able to verbalize understanding of risk associated with falls, and verbalized understanding to call nsg before up out of bed.  Call light within reach, patient able to voice, and demonstrate understanding. No evidence of skin break down noted on exam.  Admission nurse notified of admission.     Will cont to eval and treat per MD orders.  Vidal Schwalbe, RN 06/14/2016 8:55 PM

## 2016-06-14 NOTE — Anesthesia Preprocedure Evaluation (Addendum)
Anesthesia Evaluation  Patient identified by MRN, date of birth, ID band Patient awake    Reviewed: Allergy & Precautions, NPO status , Patient's Chart, lab work & pertinent test results  Airway Mallampati: II  TM Distance: >3 FB Neck ROM: Full    Dental  (+) Teeth Intact, Dental Advisory Given   Pulmonary    breath sounds clear to auscultation       Cardiovascular  Rhythm:Regular Rate:Normal     Neuro/Psych    GI/Hepatic   Endo/Other    Renal/GU      Musculoskeletal   Abdominal   Peds  Hematology   Anesthesia Other Findings 56 year old male with near obstructing colon CA. Plan GA with rapid sequence induction.  Roberts Gaudy, MD  Reproductive/Obstetrics                             Anesthesia Physical Anesthesia Plan  ASA: III  Anesthesia Plan: General   Post-op Pain Management:    Induction: Intravenous  Airway Management Planned: Oral ETT  Additional Equipment:   Intra-op Plan:   Post-operative Plan: Extubation in OR  Informed Consent: I have reviewed the patients History and Physical, chart, labs and discussed the procedure including the risks, benefits and alternatives for the proposed anesthesia with the patient or authorized representative who has indicated his/her understanding and acceptance.   Dental advisory given  Plan Discussed with: CRNA and Anesthesiologist  Anesthesia Plan Comments:         Anesthesia Quick Evaluation

## 2016-06-14 NOTE — H&P (Deleted)
Nicholas Lowery 05/30/2016 1:37 PM Location: [facility] Patient #: [id number] DOB: 04/09/1982 Single / Language: [name] / Race: White Male   History of Present Illness [name] MD; 05/30/2016 6:25 PM) The patient is a 56 year old male who presents for a melanoma biopsy follow-up. Pt is referred for consultation by Magnus Sinning, PA-C at Dr. Ledell Peoples office for new diagnosis of abdominal wall melanoma. He has numerous moles, but had one that was darker than the others. He was referred by PCP for the unusual mole on his abdominal wall. This was biopsied and found to be a superficial spreading melanoma. He denies scab or itching. He lived in [place] for a few years and was "in the sun all the time." His mother also had melanoma. His grandfather had melanoma, but not biologic grandfather since his mother was adopted.   Pathology 0.7 mm superficial spreading Clarks IV peripheral margins + deep margin - no mitoses No ulceration, satellitosis, LVI, regression, regression, neurotropism, and host response is brisk.       Past Surgical History Nance Pear, [place]; 05/30/2016 1:37 PM) Appendectomy   Diagnostic Studies History Nance Pear, [place]; 05/30/2016 1:37 PM) Colonoscopy  never  Allergies Bary Castilla [place], CMA; 05/30/2016 1:38 PM) PenicillAMINE *MISCELLANEOUS THERAPEUTIC CLASSES*  Hives Allergies Reconciled   Medication History Nance Pear, CMA; 05/30/2016 1:38 PM) No Current Medications Medications Reconciled  Social History Nance Pear, [place]; 05/30/2016 1:37 PM) Alcohol use  Occasional alcohol use. Caffeine use  Coffee. Illicit drug use  Prefer to discuss with provider. Tobacco use  Never smoker.  Family History Nance Pear, [place]; 05/30/2016 1:37 PM) Diabetes Mellitus  Family Members In General. Melanoma  Mother.  Other Problems Nance Pear, [place]; 05/30/2016 1:37 PM) Back Pain  Bladder Problems   Gastroesophageal Reflux Disease  Melanoma     Review of Systems Nance Pear CMA; 05/30/2016 1:37 PM) General Not Present- Appetite Loss, Chills, Fatigue, Fever, Night Sweats, Weight Gain and Weight Loss. Skin Not Present- Change in Wart/Mole, Dryness, Hives, Jaundice, New Lesions, Non-Healing Wounds, Rash and Ulcer. HEENT Not Present- Earache, Hearing Loss, Hoarseness, Nose Bleed, Oral Ulcers, Ringing in the Ears, Seasonal Allergies, Sinus Pain, Sore Throat, Visual Disturbances, Wears glasses/contact lenses and Yellow Eyes. Respiratory Not Present- Bloody sputum, Chronic Cough, Difficulty Breathing, Snoring and Wheezing. Breast Not Present- Breast Mass, Breast Pain, Nipple Discharge and Skin Changes. Cardiovascular Not Present- Chest Pain, Difficulty Breathing Lying Down, Leg Cramps, Palpitations, Rapid Heart Rate, Shortness of Breath and Swelling of Extremities. Gastrointestinal Not Present- Abdominal Pain, Bloating, Bloody Stool, Change in Bowel Habits, Chronic diarrhea, Constipation, Difficulty Swallowing, Excessive gas, Gets full quickly at meals, Hemorrhoids, Indigestion, Nausea, Rectal Pain and Vomiting. Male Genitourinary Not Present- Blood in Urine, Change in Urinary Stream, Frequency, Impotence, Nocturia, Painful Urination, Urgency and Urine Leakage. Musculoskeletal Present- Back Pain. Not Present- Joint Pain, Joint Stiffness, Muscle Pain, Muscle Weakness and Swelling of Extremities. Neurological Not Present- Decreased Memory, Fainting, Headaches, Numbness, Seizures, Tingling, Tremor, Trouble walking and Weakness. Psychiatric Not Present- Anxiety, Bipolar, Change in Sleep Pattern, Depression, Fearful and Frequent crying. Endocrine Not Present- Cold Intolerance, Excessive Hunger, Hair Changes, Heat Intolerance, Hot flashes and New Diabetes. Hematology Not Present- Blood Thinners, Easy Bruising, Excessive bleeding, Gland problems, HIV and Persistent Infections.  Vitals Bary Castilla  [place] CMA; 05/30/2016 1:38 PM) 05/30/2016 1:38 PM Weight: 230 lb Height: 72in Body Surface Area: 2.26 m Body Mass Index: 31.19 kg/m  Temp.: 98.2F  Pulse: 101 (Regular)  BP: 138/88 (Sitting, Left Arm, Standard)  Physical Exam [name] MD; 05/30/2016 6:20 PM) General Mental Status-Alert. General Appearance-Consistent with stated age. Hydration-Well hydrated. Voice-Normal.  Integumentary Note: shave biopsy site on right mid abdominal wall is around 1.5 x 2 cm.   Head and Neck Head-normocephalic, atraumatic with no lesions or palpable masses.  Eye Sclera/Conjunctiva - Bilateral-No scleral icterus.  Chest and Lung Exam Chest and lung exam reveals -quiet, even and easy respiratory effort with no use of accessory muscles. Inspection Chest Wall - Normal. Back - normal.  Breast - Did not examine.  Cardiovascular Cardiovascular examination reveals -normal pedal pulses bilaterally. Note: regular rate and rhythm  Abdomen Inspection-Inspection Normal. Palpation/Percussion Palpation and Percussion of the abdomen reveal - Soft, Non Tender, No Rebound tenderness, No Rigidity (guarding) and No hepatosplenomegaly.  Peripheral Vascular Upper Extremity Inspection - Bilateral - Normal - No Clubbing, No Cyanosis, No Edema, Pulses Intact. Lower Extremity Palpation - Edema - Bilateral - No edema.  Neurologic Neurologic evaluation reveals -alert and oriented x 3 with no impairment of recent or remote memory. Mental Status-Normal.  Musculoskeletal Global Assessment -Note: no gross deformities.  Normal Exam - Left-Upper Extremity Strength Normal and Lower Extremity Strength Normal. Normal Exam - Right-Upper Extremity Strength Normal and Lower Extremity Strength Normal.  Lymphatic Head & Neck  General Head & Neck Lymphatics: Bilateral - Description - Normal. Axillary  General  Axillary Region: Bilateral - Description - Normal. Tenderness - Non Tender.    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  I would plan a 1 cm excision margin around the shave biopsy site. I discussed that this will be a large incision. I reviewed that he may have numbness and a sensation of tightness there.  I do recommend no strenuous activity or lifting for 1-2 weeks. I discussed that the main risks of surgery are bleeding, infection, wound breakdown, recurrence of melanoma, dissatisfaction with the appearance, and possible need for additional procedures.  He does not need a sentinel lymph node biopsy since the Breslow thickness is 0.7 mm and he does not have evidence of mitoses.  I discussed although this is not an emergency, he should not put this off for too long. The peripheral margins were positive, and this means there is still potentially some residual melanoma present in the skin. We will do this at the first available opportunity. Current Plans Pt Education - [facility] Free Text Education/Instructions: discussed with patient and provided information. Schedule for Surgery   Signed by [name], MD (05/30/2016 6:28 PM)

## 2016-06-14 NOTE — Interval H&P Note (Signed)
History and Physical Interval Note:  06/14/2016 1:43 PM  Nicholas Lowery  has presented today for surgery, with the diagnosis of ASCENDING COLON CANCER  The various methods of treatment have been discussed with the patient and family. After consideration of risks, benefits and other options for treatment, the patient has consented to  Procedure(s): LAPAROSCOPIC ASCENDING COLECTOMY (N/A) POSSIBLE LIVER BIOPSY (N/A) as a surgical intervention .  The patient's history has been reviewed, patient examined, no change in status, stable for surgery.  I have reviewed the patient's chart and labs.  Questions were answered to the patient's satisfaction.     Calisha Tindel

## 2016-06-14 NOTE — Anesthesia Postprocedure Evaluation (Addendum)
Anesthesia Post Note  Patient: Nicholas Lowery  Procedure(s) Performed: Procedure(s) (LRB): LAPAROSCOPIC HAND ASSISTED HEMICOLECTOMY AND SMALL BOWEL RESECTION. (N/A) LIVER BIOPSY (Right)  Patient location during evaluation: PACU Anesthesia Type: General Level of consciousness: awake, awake and alert and oriented Pain management: pain level controlled Vital Signs Assessment: post-procedure vital signs reviewed and stable Respiratory status: spontaneous breathing, nonlabored ventilation and respiratory function stable Cardiovascular status: blood pressure returned to baseline Anesthetic complications: no       Last Vitals:  Vitals:   06/14/16 1710 06/14/16 1725  BP: 118/87 127/90  Pulse: (!) 59 66  Resp: 16 12  Temp:      Last Pain:  Vitals:   06/14/16 1720  TempSrc:   PainSc: 6                  Sabri Teal COKER

## 2016-06-14 NOTE — Op Note (Addendum)
Hand assisted right hemicolectomy and small bowel resection, liver biopsy  Indications: This patient presents for a laparoscopic right colectomy for obstructing right colon cancer.  Preoperative Stage IV disease based on PET.    Pre-operative Diagnosis: See above.    Post-operative Diagnosis: Same  Surgeon: [name]   Assistants: Judyann Munson, RNFA  Anesthesia: General endotracheal anesthesia   ASA Class: 3   Procedure Details  The patient was seen in the Holding Room. The risks, benefits, complications, treatment options, and expected outcomes were discussed with the patient. The possibilities of reaction to medication, perforation of viscus, bleeding, recurrent infection, finding a normal colon, the need for additional procedures, failure to diagnose a condition, and creating a complication requiring transfusion or operation were discussed with the patient. The patient was advised of the risk of ostomy. The patient concurred with the proposed plan, giving informed consent. The patient was taken to the operating room, identified, and the procedure verified as partial colectomy. A Time Out was held and the above information confirmed.   The patient was brought to the operating room and placed supine. After induction of a general anesthetic, a Foley catheter was inserted and the abdomen was prepped and draped in standard fashion. Local anesthetic was infiltrated at the left costal margin. A 5 mm Optiview trocar was placed into the abdomen under direct visualization. Pneumoperitoneum was insufflated to a pressure of 15 mm Hg. The laparoscope was introduced.   Two additional left lateral and one epigastric 5-mm trocars were then placed after anesthetizing the skin and peritoneum with Marcaine. The ascending colon and hepatic flexure were then mobilized with gentle retraction of the colon in a medial direction with mobilization of the peritoneal reflection with cautery and the harmonic  scalpel. The tumor was adherent to the posterior abdominal wall and the terminal ileum and cecum were dilated.  There was a loop of small bowel adherent to the tumor.  This was left adherent to the cecum.    A midline hand port was placed. Digital retraction was used to assist with the mobilization of the ascending colon.   Mobilization of this area was complete to expose the retroperitoneum.  The duodenum was visualized and avoided.  There was a firm portal lymph node superior to the duodenum.  The omentum was taken off the middle and proximal transverse colon with the harmonic scalpel.   The colon was delivered through the incision. The small bowel adherent to the colon.  This was divided with the GIA 75 mm stapler.  A side to side functional end to end anastamosis was created in the usual fashion.  The mesenteric defect was closed with running 2-0 vicryl.The terminal ileum and colon were resected with a linear stapling device proximal and distal to the area in question in regard to the specimen. The mesenteric vessels were clamped, cut, and tied with 2-0 vicryl ties. The specimen was submitted to pathology.   A side to side, functional end-to-end anastomosis was performed through the small anterior incision with the linear stapling device. The mucosa was inspected and found to be hemostatic. Closure was achieved with the transverse stapling device. A 3-0 Vicryl suture was used to reapproximate the angle of the anastomosis. Hemostasis was confirmed. The mesenteric defect was closed with 2-0 running Vicryl suture. The bowel anastomosis was returned. The inferior right liver mass was biopsied with a Tru-cut biopsy needle.    The OnQ tunnelers were advanced through the skin on each side of the incision.  The 5 inch catheters were advanced through the skin.   The fascial incision was then closed with a running looped #1 PDS suture. The soft tissue was irrigated and the incisions were closed with  staples  Instrument, sponge, and needle counts were correct prior to abdominal closure and at the conclusion of the case.   Findings: moderately sized mass in cecum.  Small bowel loop tethered to mass.   Grossly positive portal lymph nodes.  2-3 cm mass in right hepatic lobe.    Estimated Blood Loss: 200 mL   Specimens: R colon en bloc with portion of small bowel  Complications: None; patient tolerated the procedure well.   Disposition: PACU - hemodynamically stable.   Condition: stable

## 2016-06-15 ENCOUNTER — Other Ambulatory Visit: Payer: BLUE CROSS/BLUE SHIELD

## 2016-06-15 ENCOUNTER — Ambulatory Visit: Payer: BLUE CROSS/BLUE SHIELD | Admitting: Hematology

## 2016-06-15 ENCOUNTER — Encounter (HOSPITAL_COMMUNITY): Payer: Self-pay | Admitting: General Surgery

## 2016-06-15 LAB — BASIC METABOLIC PANEL
Anion gap: 6 (ref 5–15)
BUN: 14 mg/dL (ref 6–20)
CALCIUM: 8 mg/dL — AB (ref 8.9–10.3)
CO2: 25 mmol/L (ref 22–32)
CREATININE: 1.36 mg/dL — AB (ref 0.61–1.24)
Chloride: 105 mmol/L (ref 101–111)
GFR calc Af Amer: >60 mL/min (ref >60)
GFR, EST NON AFRICAN AMERICAN: 58 mL/min — AB (ref >60)
GLUCOSE: 142 mg/dL — AB (ref 65–99)
Potassium: 5.1 mmol/L (ref 3.5–5.1)
Sodium: 136 mmol/L (ref 135–145)

## 2016-06-15 LAB — CBC
HEMATOCRIT: 33.1 % — AB (ref 39.0–52.0)
Hemoglobin: 10.5 g/dL — ABNORMAL LOW (ref 13.0–17.0)
MCH: 28.8 pg (ref 26.0–34.0)
MCHC: 31.7 g/dL (ref 30.0–36.0)
MCV: 90.9 fL (ref 78.0–100.0)
PLATELETS: 350 10*3/uL (ref 150–400)
RBC: 3.64 MIL/uL — ABNORMAL LOW (ref 4.22–5.81)
RDW: 13.7 % (ref 11.5–15.5)
WBC: 7.9 10*3/uL (ref 4.0–10.5)

## 2016-06-15 LAB — HEMOGLOBIN A1C
Hgb A1c MFr Bld: 5 % (ref 4.8–5.6)
MEAN PLASMA GLUCOSE: 97 mg/dL

## 2016-06-15 MED ORDER — BOOST / RESOURCE BREEZE PO LIQD
1.0000 | Freq: Three times a day (TID) | ORAL | Status: DC
Start: 2016-06-15 — End: 2016-06-18
  Administered 2016-06-15 – 2016-06-18 (×7): 1 via ORAL

## 2016-06-15 NOTE — Progress Notes (Signed)
Pt ambulated with a walker around the unit x1. Tolerated well.

## 2016-06-15 NOTE — Progress Notes (Signed)
1 Day Post-Op  Subjective: Had an episode last night where he felt like he couldn't take a deep breath. That has improved.  He has already walked around the floor and gotten to the chair.  Pain control is OK with PCA. He has had some belching.  He denies flatus or BM.  No n/v.  He has had some sprite and an [place] ice.    Objective: Vital signs in last 24 hours: Temp:  [97.4 F (36.3 C)-98.6 F (37 C)] 97.7 F (36.5 C) (02/01 0542) Pulse Rate:  [59-107] 95 (02/01 0542) Resp:  [9-23] 10 (02/01 0741) BP: (118-143)/(84-100) 118/84 (02/01 0542) SpO2:  [95 %-99 %] 96 % (02/01 0741) FiO2 (%):  [97 %] 97 % (02/01 0001) Weight:  [89.5 kg (197 lb 4.8 oz)-90.9 kg (200 lb 4.8 oz)] 90.9 kg (200 lb 4.8 oz) (01/31 2050) Last BM Date: 06/13/16  Intake/Output from previous day: 01/31 0701 - 02/01 0700 In: 3590 [P.O.:240; I.V.:3350] Out: 695 [Urine:520; Blood:175] Intake/Output this shift: No intake/output data recorded.  General appearance: alert, cooperative and no distress Resp: breathing comfortably, pulling 500-750 on IS consistently Cardio: regular rate and rhythm GI: soft, approp tender, sl distended, dressings c/d/i.    Lab Results:   Recent Labs  06/14/16 2111 06/15/16 0424  WBC 13.2* 7.9  HGB 11.2* 10.5*  HCT 34.5* 33.1*  PLT 344 350   BMET  Recent Labs  06/14/16 2111 06/15/16 0424  NA  --  136  K  --  5.1  CL  --  105  CO2  --  25  GLUCOSE  --  142*  BUN  --  14  CREATININE 1.30* 1.36*  CALCIUM  --  8.0*   PT/INR No results for input(s): LABPROT, INR in the last 72 hours. ABG No results for input(s): PHART, HCO3 in the last 72 hours.  Invalid input(s): PCO2, PO2  Studies/Results: No results found.  Anti-infectives: Anti-infectives    Start     Dose/Rate Route Frequency Ordered Stop   06/14/16 2200  metroNIDAZOLE (FLAGYL) tablet 500 mg     500 mg Oral Every 8 hours 06/14/16 2053     06/14/16 2130  ciprofloxacin (CIPRO) IVPB 400 mg     400 mg 200  mL/hr over 60 Minutes Intravenous Every 12 hours 06/14/16 2053 06/14/16 2316   06/14/16 1300  clindamycin (CLEOCIN) 900 mg, gentamicin (GARAMYCIN) 240 mg in sodium chloride 0.9 % 1,000 mL for intraperitoneal lavage      Intraperitoneal To Surgery 06/13/16 1453 06/14/16 1326   06/14/16 1123  clindamycin (CLEOCIN) 900 MG/50ML IVPB    Comments:  Block, [name]   : cabinet override      06/14/16 1123 06/14/16 1401      Assessment/Plan: s/p Procedure(s) with comments: LAPAROSCOPIC HAND ASSISTED HEMICOLECTOMY AND SMALL BOWEL RESECTION. (N/A) LIVER BIOPSY (Right) - Right Inferior Liver d/c foley start lovenox ppx today  Continue ambulate/IS for pulmonary toilet Continue PCA. Not advancing diet today since pt having belching, no flatus, and sl distended.   Colon cancer- stage IV, await path.   LOS: 1 day    [facility] 06/15/2016

## 2016-06-16 LAB — BASIC METABOLIC PANEL
Anion gap: 6 (ref 5–15)
BUN: 9 mg/dL (ref 6–20)
CO2: 23 mmol/L (ref 22–32)
CREATININE: 1.02 mg/dL (ref 0.61–1.24)
Calcium: 7.7 mg/dL — ABNORMAL LOW (ref 8.9–10.3)
Chloride: 106 mmol/L (ref 101–111)
Glucose, Bld: 106 mg/dL — ABNORMAL HIGH (ref 65–99)
POTASSIUM: 4 mmol/L (ref 3.5–5.1)
SODIUM: 135 mmol/L (ref 135–145)

## 2016-06-16 LAB — CBC
HCT: 26 % — ABNORMAL LOW (ref 39.0–52.0)
HEMOGLOBIN: 8.6 g/dL — AB (ref 13.0–17.0)
MCH: 29.7 pg (ref 26.0–34.0)
MCHC: 33.1 g/dL (ref 30.0–36.0)
MCV: 89.7 fL (ref 78.0–100.0)
PLATELETS: 205 10*3/uL (ref 150–400)
RBC: 2.9 MIL/uL — AB (ref 4.22–5.81)
RDW: 14.1 % (ref 11.5–15.5)
WBC: 7.1 10*3/uL (ref 4.0–10.5)

## 2016-06-16 MED ORDER — ENOXAPARIN SODIUM 40 MG/0.4ML ~~LOC~~ SOLN
40.0000 mg | SUBCUTANEOUS | Status: DC
  Administered 2016-06-18: 40 mg via SUBCUTANEOUS
  Filled 2016-06-16: qty 0.4

## 2016-06-16 NOTE — Progress Notes (Signed)
Vomited about 50 ml yellowish colored emesis. Zofran given.

## 2016-06-16 NOTE — Progress Notes (Signed)
Initial Nutrition Assessment  DOCUMENTATION CODES:   Not applicable  INTERVENTION:  Boost Breeze po TID, each supplement provides 250 kcal and 9 grams of protein  NUTRITION DIAGNOSIS:   Inadequate oral intake related to other (see comment), cancer and cancer related treatments (clear liquid diet) as evidenced by percent weight loss, other (see comment) (clear liquid diet)  GOAL:   Patient will meet greater than or equal to 90% of their needs  MONITOR:   PO intake, Supplement acceptance, Diet advancement, Labs, Skin  REASON FOR ASSESSMENT:   Malnutrition Screening Tool    ASSESSMENT:   56 year old male who presents with colorectal cancer. Pt is a 56 yo M referred for consultation by [name] for new diagnosis of colon cancer with near obstructing ascending colon mass and hot lesion on PET in the right liver. Now s/p LAPAROSCOPIC HAND ASSISTED HEMICOLECTOMY AND SMALL BOWEL RESECTION.    Met with pt in room today. Pt in good spirits and talkative. Pt reports good appetite pta but states that he has been eating mainly liquids and soft foods since his cancer diagnosis about 2 weeks ago. Pt reports that he has lost 25lbs(11%) in 4 months. This is significant. Pt currently eating 100% clear liquid diet and drinking 100% Boost Breeze. Pt would like to have Ensure once diet advanced. Pt planning to have chemo and/or radiation at some point. RD discussed with pt the importance of adequate protein while recovering from surgery and while having cancer treatments. Pt would like education before discharge on the proper diet to follow post op.   Medications reviewed and include: lovenox, hydromorphone, metronidazole, dextrose w/ KCl  Labs reviewed: Ca 7.7(L) Hgb 8.6(L), Hct 26(L)  Nutrition-Focused physical exam completed. Findings are no fat depletion, no muscle depletion, and no edema.   Diet Order:  Diet clear liquid Room service appropriate? Yes; Fluid consistency: Thin  Skin:  Wound  (see comment) (incision abdomen )  Last BM:  1/30  Height:   Ht Readings from Last 1 Encounters:  06/14/16 _0  (1.803 m)    Weight:   Wt Readings from Last 1 Encounters:  06/14/16 200 lb 4.8 oz (90.9 kg)    Ideal Body Weight:  78.1 kg  BMI:  Body mass index is 27.94 kg/m.  Estimated Nutritional Needs:   Kcal:  2000-2300kcal/day   Protein:  100-118g/day   Fluid:  >2L/day   EDUCATION NEEDS:   No education needs identified at this time  Koleen Distance, RD, LDN Pager #[phone]

## 2016-06-16 NOTE — Progress Notes (Signed)
2 Days Post-Op  Subjective: Pt had a worse night last night.  Increased soreness.  Still no flatus, some belching.     Objective: Vital signs in last 24 hours: Temp:  [97.7 F (36.5 C)-98.8 F (37.1 C)] 97.7 F (36.5 C) (02/02 0457) Pulse Rate:  [81-97] 83 (02/02 0457) Resp:  [17-25] 18 (02/02 0828) BP: (116-136)/(78-92) 136/92 (02/02 0457) SpO2:  [94 %-100 %] 97 % (02/02 0828) Last BM Date: 06/13/16  Intake/Output from previous day: 02/01 0701 - 02/02 0700 In: 2745 [P.O.:1040; I.V.:1705] Out: 1850 [Urine:1850] Intake/Output this shift: No intake/output data recorded.  General appearance: alert, cooperative and no distress Resp: breathing comfortably, pulling 500-750 on IS consistently Cardio: regular rate and rhythm GI: soft, approp tender, sl distended, dressings c/d/i.    Lab Results:   Recent Labs  06/15/16 0424 06/16/16 0657  WBC 7.9 7.1  HGB 10.5* 8.6*  HCT 33.1* 26.0*  PLT 350 205   BMET  Recent Labs  06/15/16 0424 06/16/16 0657  NA 136 135  K 5.1 4.0  CL 105 106  CO2 25 23  GLUCOSE 142* 106*  BUN 14 9  CREATININE 1.36* 1.02  CALCIUM 8.0* 7.7*   PT/INR No results for input(s): LABPROT, INR in the last 72 hours. ABG No results for input(s): PHART, HCO3 in the last 72 hours.  Invalid input(s): PCO2, PO2  Studies/Results: No results found.  Anti-infectives: Anti-infectives    Start     Dose/Rate Route Frequency Ordered Stop   06/14/16 2200  metroNIDAZOLE (FLAGYL) tablet 500 mg     500 mg Oral Every 8 hours 06/14/16 2053     06/14/16 2130  ciprofloxacin (CIPRO) IVPB 400 mg     400 mg 200 mL/hr over 60 Minutes Intravenous Every 12 hours 06/14/16 2053 06/14/16 2316   06/14/16 1300  clindamycin (CLEOCIN) 900 mg, gentamicin (GARAMYCIN) 240 mg in sodium chloride 0.9 % 1,000 mL for intraperitoneal lavage      Intraperitoneal To Surgery 06/13/16 1453 06/14/16 1326   06/14/16 1123  clindamycin (CLEOCIN) 900 MG/50ML IVPB    Comments:  Block, [name]    : cabinet override      06/14/16 1123 06/14/16 1401      Assessment/Plan: s/p Procedure(s) with comments: LAPAROSCOPIC HAND ASSISTED HEMICOLECTOMY AND SMALL BOWEL RESECTION. (N/A) LIVER BIOPSY (Right) - Right Inferior Liver Acute blood loss anemia on top of chronic blood loss anemia -Hold lovenox.   Continue ambulate/IS for pulmonary toilet Continue PCA. Hold on diet advance.    Colon cancer- stage IV, await path.   LOS: 2 days    [facility] 06/16/2016

## 2016-06-17 LAB — CBC
HCT: 26.1 % — ABNORMAL LOW (ref 39.0–52.0)
Hemoglobin: 8.5 g/dL — ABNORMAL LOW (ref 13.0–17.0)
MCH: 28.9 pg (ref 26.0–34.0)
MCHC: 32.6 g/dL (ref 30.0–36.0)
MCV: 88.8 fL (ref 78.0–100.0)
PLATELETS: 247 10*3/uL (ref 150–400)
RBC: 2.94 MIL/uL — ABNORMAL LOW (ref 4.22–5.81)
RDW: 13.8 % (ref 11.5–15.5)
WBC: 7.2 10*3/uL (ref 4.0–10.5)

## 2016-06-17 LAB — BASIC METABOLIC PANEL
Anion gap: 7 (ref 5–15)
CALCIUM: 8.1 mg/dL — AB (ref 8.9–10.3)
CO2: 23 mmol/L (ref 22–32)
Chloride: 106 mmol/L (ref 101–111)
Creatinine, Ser: 1 mg/dL (ref 0.61–1.24)
GFR calc Af Amer: >60 mL/min (ref >60)
GLUCOSE: 111 mg/dL — AB (ref 65–99)
POTASSIUM: 3.8 mmol/L (ref 3.5–5.1)
Sodium: 136 mmol/L (ref 135–145)

## 2016-06-17 MED ORDER — HYDROMORPHONE HCL 2 MG/ML IJ SOLN: 0.5000 mg | INTRAMUSCULAR | Status: DC | PRN

## 2016-06-17 NOTE — Progress Notes (Addendum)
3 Days Post-Op  Subjective: Pt with less pain.  Starting to have flatus this am, one episode of bilious emesis yesterday.  Feels better this am   Objective: Vital signs in last 24 hours: Temp:  [97.4 F (36.3 C)-98.5 F (36.9 C)] 98.3 F (36.8 C) (02/03 0524) Pulse Rate:  [86-95] 88 (02/03 0524) Resp:  [17-22] 20 (02/03 0531) BP: (147-168)/(93-109) 147/93 (02/03 0524) SpO2:  [88 %-98 %] 98 % (02/03 0531) Last BM Date: 07/07/16  Intake/Output from previous day: 02/02 0701 - 02/03 0700 In: 1930 [P.O.:680; I.V.:1250] Out: 1200 [Urine:1200] Intake/Output this shift: No intake/output data recorded.  General appearance: alert, cooperative and no distress, sitting in chair Resp: breathing comfortably Cardio: regular rate and rhythm GI: soft, approp tender, minimally distended, dressings c/d/i.    Lab Results:   Recent Labs  06/16/16 0657 06/17/16 0616  WBC 7.1 7.2  HGB 8.6* 8.5*  HCT 26.0* 26.1*  PLT 205 247   BMET  Recent Labs  06/16/16 0657 06/17/16 0616  NA 135 136  K 4.0 3.8  CL 106 106  CO2 23 23  GLUCOSE 106* 111*  BUN 9 <5*  CREATININE 1.02 1.00  CALCIUM 7.7* 8.1*   PT/INR No results for input(s): LABPROT, INR in the last 72 hours. ABG No results for input(s): PHART, HCO3 in the last 72 hours.  Invalid input(s): PCO2, PO2  Studies/Results: No results found.  Anti-infectives: Anti-infectives    Start     Dose/Rate Route Frequency Ordered Stop   06/14/16 2200  metroNIDAZOLE (FLAGYL) tablet 500 mg     500 mg Oral Every 8 hours 06/14/16 2053     06/14/16 2130  ciprofloxacin (CIPRO) IVPB 400 mg     400 mg 200 mL/hr over 60 Minutes Intravenous Every 12 hours 06/14/16 2053 06/14/16 2316   06/14/16 1300  clindamycin (CLEOCIN) 900 mg, gentamicin (GARAMYCIN) 240 mg in sodium chloride 0.9 % 1,000 mL for intraperitoneal lavage      Intraperitoneal To Surgery 06/13/16 1453 06/14/16 1326   06/14/16 1123  clindamycin (CLEOCIN) 900 MG/50ML IVPB    Comments:   Lowery, Nicholas   : cabinet override      06/14/16 1123 06/14/16 1401      Assessment/Plan: s/p Procedure(s) with comments: LAPAROSCOPIC HAND ASSISTED HEMICOLECTOMY AND SMALL BOWEL RESECTION. (N/A) LIVER BIOPSY (Right) - Right Inferior Liver Acute blood loss anemia on top of chronic blood loss anemia -Hold lovenox.  Hgb stable today  Continue ambulate/IS for pulmonary toilet D/c PCA.  D/C pain pump Will try FLD today Colon cancer- stage IV, path reviewed with pt in detail.   LOS: 3 days    Nicholas Lowery C. [phone]

## 2016-06-18 LAB — CBC
HCT: 26.2 % — ABNORMAL LOW (ref 39.0–52.0)
Hemoglobin: 8.6 g/dL — ABNORMAL LOW (ref 13.0–17.0)
MCH: 29.1 pg (ref 26.0–34.0)
MCHC: 32.8 g/dL (ref 30.0–36.0)
MCV: 88.5 fL (ref 78.0–100.0)
PLATELETS: 269 10*3/uL (ref 150–400)
RBC: 2.96 MIL/uL — ABNORMAL LOW (ref 4.22–5.81)
RDW: 13.6 % (ref 11.5–15.5)
WBC: 6.8 10*3/uL (ref 4.0–10.5)

## 2016-06-18 LAB — BASIC METABOLIC PANEL
Anion gap: 7 (ref 5–15)
CHLORIDE: 105 mmol/L (ref 101–111)
CO2: 24 mmol/L (ref 22–32)
CREATININE: 0.86 mg/dL (ref 0.61–1.24)
Calcium: 8.2 mg/dL — ABNORMAL LOW (ref 8.9–10.3)
GFR calc Af Amer: >60 mL/min (ref >60)
Glucose, Bld: 96 mg/dL (ref 65–99)
Potassium: 3.7 mmol/L (ref 3.5–5.1)
SODIUM: 136 mmol/L (ref 135–145)

## 2016-06-18 MED ORDER — SODIUM CHLORIDE 0.9% FLUSH
3.0000 mL | Freq: Two times a day (BID) | INTRAVENOUS | Status: DC
  Administered 2016-06-18: 3 mL via INTRAVENOUS

## 2016-06-18 MED ORDER — SODIUM CHLORIDE 0.9% FLUSH: 3.0000 mL | INTRAVENOUS | Status: DC | PRN

## 2016-06-18 MED ORDER — IBUPROFEN 400 MG PO TABS: 400.0000 mg | ORAL_TABLET | Freq: Four times a day (QID) | ORAL | Status: DC | PRN

## 2016-06-18 MED ORDER — TRAMADOL HCL 50 MG PO TABS: 50.0000 mg | ORAL_TABLET | Freq: Four times a day (QID) | ORAL | Status: DC | PRN

## 2016-06-18 MED ORDER — TRAMADOL HCL 50 MG PO TABS: 50.0000 mg | ORAL_TABLET | Freq: Four times a day (QID) | ORAL | 0 refills | Status: DC | PRN

## 2016-06-18 NOTE — Progress Notes (Signed)
Patient discharged to home with instructions and prescription. 

## 2016-06-18 NOTE — Progress Notes (Addendum)
4 Days Post-Op  Subjective: Having flatus, no BM.  Tolerating fulls.  Ambulating well.  Using tylenol and toradol for pain.   Objective: Vital signs in last 24 hours: Temp:  [97.5 F (36.4 C)-98.5 F (36.9 C)] 98.4 F (36.9 C) (02/04 0526) Pulse Rate:  [75-94] 75 (02/04 0526) Resp:  [17-19] 17 (02/04 0526) BP: (135-164)/(88-109) 135/88 (02/04 0526) SpO2:  [96 %-99 %] 97 % (02/04 0526) Last BM Date: 06/14/16  Intake/Output from previous day: 02/03 0701 - 02/04 0700 In: 2545.4 [P.O.:600; I.V.:1945.4] Out: 2050 [Urine:2050] Intake/Output this shift: No intake/output data recorded.  General appearance: alert, cooperative and no distress, sitting in chair Resp: breathing comfortably Cardio: regular rate and rhythm GI: soft, approp tender, minimally distended, inc: c/d/i.    Lab Results:   Recent Labs  06/17/16 0616 06/18/16 0353  WBC 7.2 6.8  HGB 8.5* 8.6*  HCT 26.1* 26.2*  PLT 247 269   BMET  Recent Labs  06/17/16 0616 06/18/16 0353  NA 136 136  K 3.8 3.7  CL 106 105  CO2 23 24  GLUCOSE 111* 96  BUN <5* <5*  CREATININE 1.00 0.86  CALCIUM 8.1* 8.2*   PT/INR No results for input(s): LABPROT, INR in the last 72 hours. ABG No results for input(s): PHART, HCO3 in the last 72 hours.  Invalid input(s): PCO2, PO2  Studies/Results: No results found.  Anti-infectives: Anti-infectives    Start     Dose/Rate Route Frequency Ordered Stop   06/14/16 2200  metroNIDAZOLE (FLAGYL) tablet 500 mg     500 mg Oral Every 8 hours 06/14/16 2053     06/14/16 2130  ciprofloxacin (CIPRO) IVPB 400 mg     400 mg 200 mL/hr over 60 Minutes Intravenous Every 12 hours 06/14/16 2053 06/14/16 2316   06/14/16 1300  clindamycin (CLEOCIN) 900 mg, gentamicin (GARAMYCIN) 240 mg in sodium chloride 0.9 % 1,000 mL for intraperitoneal lavage      Intraperitoneal To Surgery 06/13/16 1453 06/14/16 1326   06/14/16 1123  clindamycin (CLEOCIN) 900 MG/50ML IVPB    Comments:  Block, [name]   :  cabinet override      06/14/16 1123 06/14/16 1401      Assessment/Plan: s/p Procedure(s) with comments: LAPAROSCOPIC HAND ASSISTED HEMICOLECTOMY AND SMALL BOWEL RESECTION. (N/A) LIVER BIOPSY (Right) - Right Inferior Liver Acute blood loss anemia on top of chronic blood loss anemia.  Hgb stable today  -restart lovenox. Continue ambulate/IS for pulmonary toilet Will try reg diet Colon cancer- stage IV, path reviewed with pt in detail.  Copy of path report given to pt Dispo: If pt tolerates a diet and pain controlled with PO meds, ok to d/c home   LOS: 4 days    Nicholas Lowery C. [phone]

## 2016-06-18 NOTE — Discharge Instructions (Signed)

## 2016-06-19 NOTE — Discharge Summary (Signed)
Patient ID: Azan Faver [id number] y.o. 06-30-1960  06/14/2016  Discharge date and time: 06/18/2016  1:52 PM  Admitting Physician: Dr Barry Dienes  Discharge Physician: Rosario Adie.  Admission Diagnoses: ASCENDING COLON CANCER  Discharge Diagnoses: stage 4 colon cancer  Operations: Procedure(s): LAPAROSCOPIC HAND ASSISTED HEMICOLECTOMY AND SMALL BOWEL RESECTION. LIVER BIOPSY    Discharged Condition: good    Hospital Course: Pt admitted after surgery.  His diet was advanced as tolerated.  He was discharged to home once he had evidence of bowel function and was tolerating a diet.  He pain was controlled on oral pain meds and he was tolerating a diet.  Consults: None  Significant Diagnostic Studies: labs  Treatments: IV hydration, analgesia: acetaminophen and surgery: see op note  Disposition: Home

## 2016-06-23 ENCOUNTER — Telehealth: Payer: Self-pay | Admitting: *Deleted

## 2016-06-23 NOTE — Telephone Encounter (Signed)
Pt called requesting call back from nurse.   Spoke with pt and was informed that pt had staples removed from surgery, and would like to go ahead with next plan of care for chemo.   Gave pt appt with [name] on 07/05/16.  Gave pt phone number to the dietitian for questions about nutrition needs.  Pt voiced understanding. Pt's   Phone     [phone].

## 2016-06-23 NOTE — Telephone Encounter (Signed)
Please also let pt know that we plan to start his chemo the week of 2/26, and encourage pt to get a port before 2/26. Dr. Barry Dienes will put a port in for him.   Truitt Merle MD

## 2016-06-23 NOTE — Telephone Encounter (Signed)
Left message on voicemail instructing pt to contact Dr. Marlowe Aschoff office for appt. Per [name], plan to start treatment week of 2/26.

## 2016-06-23 NOTE — Telephone Encounter (Signed)
Spoke with pt and informed pt of Dr. Ernestina Penna instructions below.  Pt stated he also has f/u appt with Dr. Barry Dienes on 07/05/16 at 4 pm.  Instructed pt to keep both appts with [name] and Dr. Barry Dienes as scheduled.  Pt voiced understanding.

## 2016-06-26 ENCOUNTER — Ambulatory Visit: Payer: BLUE CROSS/BLUE SHIELD | Admitting: Nutrition

## 2016-06-26 NOTE — Progress Notes (Signed)
56 year old male diagnosed with right colon cancer with metastases to liver, status post lap,hemicolectomy and small bowel resection.  Physical patient of [name].  Past medical history includes anxiety.  Medications include Celebrex, Colace, Prozac, Zofran, probiotics, and Phenergan.  Labs were reviewed.  Height: 5 feet 11 inches. Weight: 187 pounds February 12. Usual body weight: Approximately 230 pounds. BMI: 26.08.  Estimated nutrition needs: 2400-2600 calories, 110-125 grams protein, 2.5 L fluid.  Patient reports he has lost approximately 40 pounds over the last 2-1/2 months. He has had decreased appetite and occasional constipation. He typically eats one meal a day and states he never ate a healthy diet. Patient is looking for information on how improve his diet for chemotherapy.  Nutrition diagnosis:  Food and nutrition related knowledge deficit related to colon cancer and associated treatments as evidenced by no prior need for nutrition related information.  Intervention: Educated patient on the importance of small frequent meals and snacks to promote weight maintenance. Encouraged healthy plant-based diet with higher protein foods. Encouraged oral nutrition supplements such as Carnation breakfast essentials 1-2 daily between meals. Answered multiple questions.  Contact information was given.  Teach back method was used.  Fact sheets were provided.  Monitoring, evaluation, goals:  Patient will tolerate adequate calories and protein to promote weight maintenance.  Next visit: To be scheduled with treatment as needed.  **Disclaimer: This note was dictated with voice recognition software. Similar sounding words can inadvertently be transcribed and this note may contain transcription errors which may not have been corrected upon publication of note.**

## 2016-06-28 ENCOUNTER — Telehealth: Payer: Self-pay | Admitting: Hematology

## 2016-06-28 ENCOUNTER — Other Ambulatory Visit: Payer: Self-pay | Admitting: General Surgery

## 2016-06-28 NOTE — Telephone Encounter (Signed)
Received FMLA paperwork to be completed °

## 2016-06-30 ENCOUNTER — Telehealth: Payer: Self-pay | Admitting: Hematology

## 2016-06-30 NOTE — Telephone Encounter (Signed)
Faxed requested FMLA paperwork to [employer] fax [phone]

## 2016-07-03 ENCOUNTER — Encounter (HOSPITAL_BASED_OUTPATIENT_CLINIC_OR_DEPARTMENT_OTHER): Payer: Self-pay | Admitting: *Deleted

## 2016-07-04 ENCOUNTER — Ambulatory Visit: Payer: BLUE CROSS/BLUE SHIELD | Admitting: Hematology

## 2016-07-04 NOTE — Progress Notes (Signed)
[place]  [phone] [phone]  Clinic Follow Up Note  Patient Care Team: Curlene Labrum, MD as PCP - General (Family Medicine) 07/05/2016  CHIEF COMPLAINTS:  Follow up right colon cancer and liver mass  Oncology History   Cancer Staging Metastatic colon cancer to liver [facility]) Staging form: Colon and Rectum, AJCC 8th Edition - Clinical stage from 06/01/2016: Stage IVA (cTX, cNX, pM1a) - Signed by Truitt Merle, MD on 07/04/2016 - Pathologic stage from 06/14/2016: Stage IVA (pT4b(m), pN2b, pM1a) - Signed by Truitt Merle, MD on 07/04/2016       Metastatic colon cancer to liver ([place])   04/2015 Procedure    Colonoscopy by Dr. Ladona Horns. It showed showed 2 sessile polyps ready between 3-5 mm in size located 20 cm (A, B) from the point of entry, polypectomy was performed. Pedunculated polyp was found in the ascending colon (C), polypectomy was performed, and additional polyp (D) was found 30 cm from the point of entry, removed      04/2015 Pathology Results    tubular adenoma (A and B), and well differentiated adenocarcinoma arising from tubulovillous adenoma (C) and well differentiated adenocarcinoma arising from severe dysplasia to intramucosal carcinoma within tubular adenoma.       04/2015 Initial Diagnosis    Metastatic colon cancer to liver ([place])      05/29/2016 Imaging    CT abdomen and pelvis with contrast showed an apple core like stricture in right colon just above the cecum, measuring 3.2 cm in lengths. This is highly suspicious for malignancy. Small lymph node a noticed he had adjacent mesentery, measuring 8 mm. There is a low-density lesion within the inferior aspect of the right hepatic lobe measuring 1.7 cm, suspicious for [place].       06/06/2016 Tumor Marker    CEA 9.99      06/08/2016 PET scan    IMPRESSION: Approximately 3 cm hypermetabolic mass in the ascending colon, consistent with primary colon carcinoma. This mass results in  colonic obstruction and small bowel dilatation. Additional areas of hypermetabolic wall thickening in the cecum may represent other sites of colon carcinoma or colitis. Mild hypermetabolic lymphadenopathy in right pericolonic region, porta hepatis, and aortocaval space, consistent with metastatic disease. Mild hypermetabolic mediastinal lymphadenopathy also seen, and thoracic lymph node metastases cannot be excluded. Solitary hypermetabolic focus in inferior right hepatic lobe, consistent with liver metastasis. Consider abdomen MRI without and with contrast for further evaluation.      06/14/2016 Surgery    Hand assisted right hemicolectomy and small bowel resection for colon cancer, liver biopsy, by Dr. Barry Dienes      06/14/2016 Pathology Results    Right hemicolectomy showed invasive well to moderately differentiated adenocarcinoma, 2 foci measuring 7.5 cm and 4.5 cm, tumor invades through full thickness of colon, to the seroma and involve the Small Bowel, Surgical Margins Were Negative, 24 Out Of 64 Lymph Nodes Were Positive, Extracapsular Extension Identified, Multiple Satellite Tumor Deposits Present, Liver Biopsy Showed Metastatic Adenocarcinoma.        06/14/2016 Miscellaneous    Tumor MMR normal, MSI stable        HISTORY OF PRESENTING ILLNESS:  Nicholas Lowery 56 y.o. male is here because of his recently abdominal CT which is highly suspicious for metastatic colon cancer. He is accompanied by his wife to my clinic today. He was referred by his primary care physician Dr. Pleas Koch.   He had colonoscopy in 2016 for mild rectal bleeding, he describe small  amount fresh blood mixed with stool, he has no other constitutional symptoms at that time. He was referred to gastroenterologist Dr. Ladona Horns and underwent a colonoscopy in December 2016. The colonoscopy showed 2 sessile polyps ready between 3-5 mm in size located 20 cm (A, B) from the point of entry, polypectomy was performed.  Pedunculated polyp was found in the ascending colon (C), polypectomy was performed, and additional polyp (D) was found 30 cm from the point of entry, removed. The pathology reviewed tubular adenoma (A and B), and well differentiated adenocarcinoma arising from tubulovillous adenoma (C) and well differentiated adenocarcinoma arising from severe dysplasia to intramucosal carcinoma within tubular adenoma. Dr. Tye [place] tried multiple times to reach patient, but patient sought they were calling him about the bill, and did not return the phone calls. He was not aware the cancer diagnosis until recently.   He started having diarrhea and vomiting in mid Dec 2017, and felt a "pop" in right side abdomen, he was seen at urgent care, and was treated with antiemetics, and IVF, lab tests were OK. Due to his persistent intermittent diarrhea and epigastric pain since then, he was seen by PCP and he eventually had CT abdomen and pelvis scan which showed a upper core lesion in the ascending colon, and I'll 1.7 cm lesion in the liver, highly suspicious for metastasis. He was referred to [place] for further evaluation.  He has lost 30 lbs in the past one month, has low appetite, eats a small meals 1-2 times a day. He has moderate fatigue, able to tolerate routine activities including his work, but feels exhausted at the end of study. He has occasional constipation, denies recent rectal bleeding.  CURRENT THERAPY: pending chemotherapy   Narrative: The patient returns today for follow up. He developed bowel obstruction and underwent right hemicolectomy on 06/14/2016. He reports he had more colon removed than he expected, though he reports he is happy with the results. He has returned to work. He reports he has not had much pain, but is taking his pain medication once per day in the morning to prevent any discomfort. The only pain he has is in his abdomen, and he reports when he bends over, he feels that something "catches" and he must be  mindful of his movements. This pain is manageable. He also reports some discomfort to the right shoulder, and he is concerned this is cancer related. He reports a good appetite and some weight gain.  MEDICAL HISTORY:  Past Medical History:  Diagnosis Date  . Anxiety   . Cancer of ascending colon ([place])   . Seasonal allergies     SURGICAL HISTORY: Past Surgical History:  Procedure Laterality Date  . APPENDECTOMY  1992  . CATARACT EXTRACTION W/ INTRAOCULAR LENS IMPLANT Left 04/2016  . COLON SURGERY    . COLONOSCOPY W/ BIOPSIES AND POLYPECTOMY  04/2015  . INGUINAL HERNIA REPAIR Right 1982  . KNEE ARTHROSCOPY W/ MENISCECTOMY Right 1998  . LAPAROSCOPIC RIGHT COLECTOMY N/A 06/14/2016   Procedure: LAPAROSCOPIC HAND ASSISTED HEMICOLECTOMY AND SMALL BOWEL RESECTION.;  Surgeon: [name], MD;  Location: [facility];  Service: General;  Laterality: N/A;  . LIVER BIOPSY Right 06/14/2016   Procedure: LIVER BIOPSY;  Surgeon: [name], MD;  Location: [place];  Service: General;  Laterality: Right;  Right Inferior Liver  . VASECTOMY  2005    SOCIAL HISTORY: Social History   Social History  . Marital status: Married    Spouse name: N/A  . Number of children:  N/A  . Years of education: N/A   Occupational History  . Not on file.   Social History Main Topics  . Smoking status: Former Smoker    Years: 2.00    Types: Cigarettes    Quit date: 42  . Smokeless tobacco: Never Used  . Alcohol use 2.4 oz/week    4 Cans of beer per week     Comment: social, none since colon surgery  . Drug use: No     Comment: 06/15/2016 "nothing since college"  . Sexual activity: Yes   Other Topics Concern  . Not on file   Social History Narrative  . No narrative on file   He is married.  They have 3 boys, 67-12 yo. He works for a [employer], desk job.   FAMILY HISTORY: Family History  Problem Relation Age of Onset  . Cancer Mother     lung cancer  . Stroke Mother   . Hypertension Father     . CAD Father   . Cancer Maternal Grandfather     prostate cancer     ALLERGIES:  is allergic to penicillins.  MEDICATIONS:  Current Outpatient Prescriptions  Medication Sig Dispense Refill  . FLUoxetine (PROZAC) 40 MG capsule Take 40 mg by mouth daily.    . traMADol (ULTRAM) 50 MG tablet Take 1-2 tablets (50-100 mg total) by mouth every 6 (six) hours as needed for moderate pain or severe pain. 30 tablet 0  . celecoxib (CELEBREX) 200 MG capsule Take 200 mg by mouth daily.  1  . diphenoxylate-atropine (LOMOTIL) 2.5-0.025 MG tablet TAKE TWO TABLETS BY MOUTH FOUR TIMES DAILY AS NEEDED FOR FOR DIARRHEA OR loose stools  0  . docusate sodium (COLACE) 100 MG capsule Take 100 mg by mouth daily.    . IRON PO Take 1 tablet by mouth daily.    . ondansetron (ZOFRAN ODT) 8 MG disintegrating tablet Take 1 tablet (8 mg total) by mouth every 8 (eight) hours as needed for nausea or vomiting. (Patient not taking: Reported on 07/05/2016) 30 tablet 2  . Probiotic Product (PROBIOTIC PO) Take 1 capsule by mouth daily.    . promethazine (PHENERGAN) 25 MG tablet Take 25 mg by mouth every 6 (six) hours as needed. for nausea  1   No current facility-administered medications for this visit.    Facility-Administered Medications Ordered in Other Visits  Medication Dose Route Frequency Provider Last Rate Last Dose  . clindamycin (CLEOCIN) 900 mg in dextrose 5 % 50 mL IVPB  900 mg Intravenous 60 min Pre-Op [name], MD       And  . gentamicin (GARAMYCIN) 450 mg in dextrose 5 % 50 mL IVPB  5 mg/kg Intravenous 60 min Pre-Op [name], MD        REVIEW OF SYSTEMS:   Constitutional: Denies fevers, chills or abnormal night sweats, (+) weight gain Eyes: Denies blurriness of vision, double vision or watery eyes Ears, nose, mouth, throat, and face: Denies mucositis or sore throat Respiratory: Denies cough, dyspnea or wheezes Cardiovascular: Denies palpitation, chest discomfort or lower extremity  swelling Gastrointestinal:  Denies nausea, heartburn or change in bowel habits, (+) mild pain to abdomen when bending over Skin: Denies abnormal skin rashes Lymphatics: Denies new lymphadenopathy or easy bruising Musculoskeletal: (+) mild pain to right shoulder Neurological:Denies numbness, tingling or new weaknesses Behavioral/Psych: Mood is stable, no new changes  All other systems were reviewed with the patient and are negative.  PHYSICAL EXAMINATION: ECOG PERFORMANCE STATUS:  1 - Symptomatic but completely ambulatory  Vitals:   07/05/16 0928  BP: 132/89  Pulse: 79  Resp: 18  Temp: 98.1 F (36.7 C)   Filed Weights   07/05/16 0928  Weight: 193 lb 11.2 oz (87.9 kg)    GENERAL:alert, no distress and comfortable. SKIN: skin color, texture, turgor are normal, no rashes or significant lesions EYES: normal, conjunctiva are pink and non-injected, sclera clear OROPHARYNX:no exudate, no erythema and lips, buccal mucosa, and tongue normal  NECK: supple, thyroid normal size, non-tender, without nodularity LYMPH:  no palpable lymphadenopathy in the cervical, axillary or inguinal LUNGS: clear to auscultation and percussion with normal breathing effort HEART: regular rate & rhythm and no murmurs and no lower extremity edema ABDOMEN:abdomen soft, Surgical scar in the midline around the umbilical has healed well. non-tender and normal bowel sounds Musculoskeletal:no cyanosis of digits and no clubbing  PSYCH: alert & oriented x 3 with fluent speech NEURO: no focal motor/sensory deficits  LABORATORY DATA:  I have reviewed the data as listed CBC Latest Ref Rng & Units 06/18/2016 06/17/2016 06/16/2016  WBC 4.0 - 10.5 K/uL 6.8 7.2 7.1  Hemoglobin 13.0 - 17.0 g/dL 8.6(L) 8.5(L) 8.6(L)  Hematocrit 39.0 - 52.0 % 26.2(L) 26.1(L) 26.0(L)  Platelets 150 - 400 K/uL 269 247 205   Surgical path   Diagnosis 06/14/2016 1. Colon, segmental resection for tumor, Right Ascending Hemicolectomy and Small  Bowel - INVASIVE WELL TO MODERATELY DIFFERENTIATED ADENOCARCINOMA. - TWO TUMOR FOCI MEASURING 7.5 CM AND 4.5 CM IN GREATEST DIMENSION. - TUMOR INVADES THROUGH FULL THICKNESS OF COLON, THROUGH THE SEROSA TO INVOLVE THE SMALL BOWEL. - MARGINS ARE NEGATIVE. - TWENTY FOUR OF SIXTY FOUR LYMPH NODES POSITIVE FOR METASTATIC ADENOCARCINOMA (24/64). - EXTRACAPSULAR EXTENSION IDENTIFIED - MULTIPLE SATELLITE TUMOR DEPOSITS PRESENT. - SEE ONCOLOGY TEMPLATE. 2. Liver, needle/core biopsy, Right Inferior - POSITIVE FOR METASTATIC ADENOCARCINOMA.  RADIOGRAPHIC STUDIES: I have personally reviewed his outside CT scan from 05/29/2016 and agreed with the findings in the report. Nm Pet Image Initial (pi) Skull Base To Thigh  Result Date: 06/08/2016 CLINICAL DATA:  Initial treatment strategy for metastatic colon carcinoma. EXAM: NUCLEAR MEDICINE PET SKULL BASE TO THIGH TECHNIQUE: 12.6 mCi F-18 FDG was injected intravenously. Full-ring PET imaging was performed from the skull base to thigh after the radiotracer. CT data was obtained and used for attenuation correction and anatomic localization. FASTING BLOOD GLUCOSE:  Value: 85 mg/dl COMPARISON:  None. FINDINGS: NECK No hypermetabolic lymph nodes in the neck. CHEST 11 mm hypermetabolic mediastinal lymph node in the high right paratracheal region measures 11 mm on image 78/3 and has SUV max of 6.2. Mild hypermetabolic mediastinal lymphadenopathy is also seen in the left mid paraesophageal region, with largest lymph node measuring 12 mm on image 98/3 with SUV max of 5.5. No hypermetabolic hilar or axillary lymph nodes. No suspicious pulmonary nodules seen on CT images. ABDOMEN/PELVIS A hypermetabolic focus is seen in the inferior right hepatic lobe on image 170/4, which is not visible on CT but is consistent with a liver metastasis. No other definite hypermetabolic liver lesions are identified. Obstructing hypermetabolic mass is seen within the ascending colon which measures  approximately 3 cm on image 181/3, and has SUV max of 11.3. This is consistent with primary colon carcinoma. There is causes cecal and small bowel dilatation. In addition, focal areas of hypermetabolic wall thickening seen in the cecum which may be due to colon carcinoma or colitis. Small less than 1 cm pericolonic lymph nodes are seen  in the right abdomen which show mild hypermetabolic activity. There is hypermetabolic lymphadenopathy in the porta hepatis, with largest lymph node measuring 2.1 cm on image 148/3 with SUV max of 7.0. A hypermetabolic retroperitoneal lymph node is also seen aortocaval space measuring 1.4 cm on image 169/3, with SUV max of 8.4 . SKELETON No focal hypermetabolic activity to suggest skeletal metastasis. IMPRESSION: Approximately 3 cm hypermetabolic mass in the ascending colon, consistent with primary colon carcinoma. This mass results in colonic obstruction and small bowel dilatation. Additional areas of hypermetabolic wall thickening in the cecum may represent other sites of colon carcinoma or colitis . Mild hypermetabolic lymphadenopathy in right pericolonic region, porta hepatis, and aortocaval space, consistent with metastatic disease. Mild hypermetabolic mediastinal lymphadenopathy also seen, and thoracic lymph node metastases cannot be excluded. Solitary hypermetabolic focus in inferior right hepatic lobe, consistent with liver metastasis. Consider abdomen MRI without and with contrast for further evaluation. Electronically Signed   By: Earle Gell M.D.   On: 06/08/2016 16:49    ASSESSMENT & PLAN:  57 y.o. Caucasian male, without significant past medical history, presented with rectal bleeding in December 2016, colonoscopy showed 4 polyps, 2 of them showed invasive adenocarcinoma, unfortunately he was not aware of the pathology findings and was not treated. He now presented with fatigue, weight loss, anorexia and abdominal pain  1. Right colon cancer with liver and node  metastasis, [id number], stage IV, MSI-stable  -I reviewed her PET scan findings, which showed solitary liver metastasis, abdominal and possible thoracic node metastasis  -His liver biopsy confirmed metastasis -I reviewed his surgical pathology findings, which showed 2 primary right colon cancer, very locally advanced with 24 lymph nodes positive, surgical margins were negative. -We reviewed the patient's CEA from 06/06/16 which was 9.99. -I recommend him to start systemic chemotherapy, I discussed the option of FOLFOX, CAPOX, and FOLFIRI. I recommend genomic testing [facility], to see if he is a candidate for EGFR inhibitor. If not, I would add Avastin to his chemotherapy regimen. His tumor has MSI stable, unlikely would benefit from immunotherapy alone.  -After a lengthy discussion, patient agrees to start FOLFOX  --Chemotherapy consent: Side effects including but does not not limited to, fatigue, nausea, vomiting, diarrhea, hair loss, neuropathy, fluid retention, renal and kidney dysfunction, neutropenic fever, needed for blood transfusion, bleeding, coronary artery spasm and heart attack, heart failure were discussed with patient in great detail. He agrees to proceed. -The goal for chemotherapy is palliative, giving his metastatic disease. -We discussed the possible local therapy options, such as liver metastectomy or ablation if he has great response to chemo and his node metastasis resolves  -We discussed the chemotherapy education class, and the patient's will schedule a time to attend. -we also discussed nutrition supplement, healthy diet, exercise, and his work arrangement when he is on chemotherapy.  2. Anemia -He has mild anemia, likely related to his colon cancer bleeding. -His HGB was low following surgery. I will order a repeat lab. -I previously recommend him to take oral iron supplement over-the-counter, potential side effects of constipation and gastric or discomfort or discussed  with him. He stopped taking this before surgery. -I advised him to begin taking oral iron supplements again.  3. Anorexia and weight loss -Secondary to underlying malignancy -I encouraged him to try nutritional supplement, such as boost or initial -The patient has gained some weight recently.  4. Abdominal pain -mild, he takes celecobix as needed. -The patient stopped taking this medication for surgery, but  has since restarted.  5. Goal of care discussion  -We again discussed the incurable nature of his cancer, and the overall poor prognosis, especially if he does not have good response to chemotherapy or progress on chemo -The patient understands the goal of care is palliative. -I recommend DNR/DNI, he will think about it   Plan -The patient will have a port put in tomorrow 07/06/16. -Begin chemotherapy next week with FOLFOX and continue every 2 weeks  -I will request Genomic testing [facility] to determine candidacy for EGFR inhibitor. -I will order repeat labs. -I will see him back on 07/27/16 before his second cycle of chemotherapy.   Orders Placed This Encounter  Procedures  . CEA    Standing Status:   Standing    Number of Occurrences:   50    Standing Expiration Date:   07/04/2021  . CBC with Differential/Platelet    Standing Status:   Standing    Number of Occurrences:   50    Standing Expiration Date:   07/04/2021  . Comprehensive metabolic panel    Standing Status:   Standing    Number of Occurrences:   50    Standing Expiration Date:   07/04/2021  . Ferritin    Standing Status:   Standing    Number of Occurrences:   30    Standing Expiration Date:   07/05/2021  . Iron and TIBC    Standing Status:   Standing    Number of Occurrences:   30    Standing Expiration Date:   07/05/2021    All questions were answered. The patient knows to call the clinic with any problems, questions or concerns. I spent 30 minutes counseling the patient face to face. The total time  spent in the appointment was 40 minutes and more than 50% was on counseling.  This document serves as a record of services personally performed by Truitt Merle, MD. It was created on her behalf by Maryla Morrow, a trained medical scribe. The creation of this record is based on the scribe's personal observations and the provider's statements to them. This document has been checked and approved by the attending provider.    Truitt Merle, MD 07/05/2016

## 2016-07-05 ENCOUNTER — Telehealth: Payer: Self-pay | Admitting: *Deleted

## 2016-07-05 ENCOUNTER — Telehealth: Payer: Self-pay | Admitting: Hematology

## 2016-07-05 ENCOUNTER — Ambulatory Visit (HOSPITAL_BASED_OUTPATIENT_CLINIC_OR_DEPARTMENT_OTHER): Payer: BLUE CROSS/BLUE SHIELD | Admitting: Hematology

## 2016-07-05 ENCOUNTER — Encounter: Payer: Self-pay | Admitting: Hematology

## 2016-07-05 VITALS — BP 132/89 | HR 79 | Temp 98.1°F | Resp 18 | Ht 71.0 in | Wt 193.7 lb

## 2016-07-05 DIAGNOSIS — D509 Iron deficiency anemia, unspecified: Secondary | ICD-10-CM | POA: Diagnosis not present

## 2016-07-05 DIAGNOSIS — C787 Secondary malignant neoplasm of liver and intrahepatic bile duct: Secondary | ICD-10-CM

## 2016-07-05 DIAGNOSIS — Z7189 Other specified counseling: Secondary | ICD-10-CM | POA: Insufficient documentation

## 2016-07-05 DIAGNOSIS — C189 Malignant neoplasm of colon, unspecified: Secondary | ICD-10-CM

## 2016-07-05 DIAGNOSIS — R630 Anorexia: Secondary | ICD-10-CM | POA: Diagnosis not present

## 2016-07-05 DIAGNOSIS — C182 Malignant neoplasm of ascending colon: Secondary | ICD-10-CM

## 2016-07-05 DIAGNOSIS — D500 Iron deficiency anemia secondary to blood loss (chronic): Secondary | ICD-10-CM

## 2016-07-05 DIAGNOSIS — R634 Abnormal weight loss: Secondary | ICD-10-CM

## 2016-07-05 MED ORDER — LIDOCAINE-PRILOCAINE 2.5-2.5 % EX CREA: TOPICAL_CREAM | CUTANEOUS | 3 refills | Status: DC

## 2016-07-05 NOTE — Telephone Encounter (Signed)
Appointments scheduled per 2/21 LOS. Patient given AVS report and calendars with future scheduled appointments. Will call patient with follow up appointment time once availability of the physician is given.

## 2016-07-05 NOTE — Progress Notes (Signed)
START ON PATHWAY REGIMEN - Colorectal  COS72: Neoadjuvant mFOLFOX6 + Bevacizumab (Number of Cycles to be Determined by Surgeon)    A cycle is every 14 days:     Oxaliplatin (Eloxatin(R)) 85 mg/m2 in 250 mL D5W IV over 2 hours day 1, q14 days       Dose Mod: None     Leucovorin 400 mg/m2 in 250 mL D5W IV over 2 hours day 1, q14 days,  followed immediately by       Dose Mod: None     5-Fluorouracil 400 mg/m2 IV bolus over 2-4 minutes day 1, q14 days       Dose Mod: None     5-Fluorouracil 2,400 mg/m2 in _____mL NS CIV as a 46 hour infusion starting on day 1, q14 days       Dose Mod: None     Bevacizumab (Avastin(R)) 5 mg/kg in 100 mL NS IV every 2 weeks.  Administer first dose over 90 minutes if tolerated second dose over 60 minutes and if tolerated all subsequent doses over 30 minutes       Dose Mod: None         Additional Orders: **Note: order sheet contains two q14 day cycles**  **Always confirm dose/schedule in your pharmacy ordering system**    Patient Characteristics: Metastatic Colorectal, First Line, Potentially Resectable, KRAS Mutation Positive/Unknown, BRAF Wild-Type/Unknown Current evidence of distant metastases? Yes AJCC T Category: [place] AJCC N Category: NX AJCC M Category: Staged < 8th Ed. AJCC 8 Stage Grouping: IVC BRAF Mutation Status: Awaiting Test Results KRAS/NRAS Mutation Status: Awaiting Test Results Line of therapy: First Line Would you be surprised if this patient died  in the next year? I would be surprised if this patient died in the next year  Intent of Therapy: Non-Curative / Palliative Intent, Discussed with Patient

## 2016-07-05 NOTE — Telephone Encounter (Signed)
Per 2/21 LOS and staff message I have scheduled appts. Notified the scheduler 

## 2016-07-06 ENCOUNTER — Ambulatory Visit (HOSPITAL_BASED_OUTPATIENT_CLINIC_OR_DEPARTMENT_OTHER)
Admission: RE | Admit: 2016-07-06 | Discharge: 2016-07-06 | Disposition: A | Discharge status: Home / Self Care | Payer: BLUE CROSS/BLUE SHIELD | Source: Ambulatory Visit | Attending: General Surgery | Admitting: General Surgery

## 2016-07-06 ENCOUNTER — Encounter (HOSPITAL_BASED_OUTPATIENT_CLINIC_OR_DEPARTMENT_OTHER)
Admission: RE | Disposition: A | Discharge status: Home / Self Care | Payer: Self-pay | Source: Ambulatory Visit | Attending: General Surgery

## 2016-07-06 ENCOUNTER — Encounter (HOSPITAL_BASED_OUTPATIENT_CLINIC_OR_DEPARTMENT_OTHER): Payer: Self-pay | Admitting: *Deleted

## 2016-07-06 ENCOUNTER — Other Ambulatory Visit (HOSPITAL_BASED_OUTPATIENT_CLINIC_OR_DEPARTMENT_OTHER): Payer: BLUE CROSS/BLUE SHIELD

## 2016-07-06 ENCOUNTER — Ambulatory Visit (HOSPITAL_COMMUNITY): Payer: BLUE CROSS/BLUE SHIELD

## 2016-07-06 ENCOUNTER — Ambulatory Visit (HOSPITAL_BASED_OUTPATIENT_CLINIC_OR_DEPARTMENT_OTHER): Payer: BLUE CROSS/BLUE SHIELD | Admitting: Anesthesiology

## 2016-07-06 ENCOUNTER — Encounter: Payer: Self-pay | Admitting: *Deleted

## 2016-07-06 ENCOUNTER — Other Ambulatory Visit: Payer: BLUE CROSS/BLUE SHIELD

## 2016-07-06 DIAGNOSIS — D509 Iron deficiency anemia, unspecified: Secondary | ICD-10-CM

## 2016-07-06 DIAGNOSIS — C182 Malignant neoplasm of ascending colon: Secondary | ICD-10-CM

## 2016-07-06 DIAGNOSIS — D500 Iron deficiency anemia secondary to blood loss (chronic): Secondary | ICD-10-CM

## 2016-07-06 DIAGNOSIS — C189 Malignant neoplasm of colon, unspecified: Secondary | ICD-10-CM

## 2016-07-06 DIAGNOSIS — Z79899 Other long term (current) drug therapy: Secondary | ICD-10-CM | POA: Insufficient documentation

## 2016-07-06 DIAGNOSIS — R160 Hepatomegaly, not elsewhere classified: Secondary | ICD-10-CM | POA: Diagnosis not present

## 2016-07-06 DIAGNOSIS — R590 Localized enlarged lymph nodes: Secondary | ICD-10-CM | POA: Insufficient documentation

## 2016-07-06 DIAGNOSIS — C787 Secondary malignant neoplasm of liver and intrahepatic bile duct: Secondary | ICD-10-CM

## 2016-07-06 DIAGNOSIS — Z791 Long term (current) use of non-steroidal anti-inflammatories (NSAID): Secondary | ICD-10-CM | POA: Diagnosis not present

## 2016-07-06 DIAGNOSIS — Z87891 Personal history of nicotine dependence: Secondary | ICD-10-CM | POA: Diagnosis not present

## 2016-07-06 DIAGNOSIS — Z95828 Presence of other vascular implants and grafts: Secondary | ICD-10-CM

## 2016-07-06 HISTORY — PX: PORTACATH PLACEMENT: SHX2246

## 2016-07-06 LAB — CBC WITH DIFFERENTIAL/PLATELET
BASO%: 0.1 % (ref 0.0–2.0)
Basophils Absolute: 0 10*3/uL (ref 0.0–0.1)
EOS%: 0.3 % (ref 0.0–7.0)
Eosinophils Absolute: 0 10*3/uL (ref 0.0–0.5)
HCT: 32.7 % — ABNORMAL LOW (ref 38.4–49.9)
HGB: 10.5 g/dL — ABNORMAL LOW (ref 13.0–17.1)
LYMPH%: 4.3 % — AB (ref 14.0–49.0)
MCH: 29.3 pg (ref 27.2–33.4)
MCHC: 32.1 g/dL (ref 32.0–36.0)
MCV: 91.3 fL (ref 79.3–98.0)
MONO#: 0 10*3/uL — AB (ref 0.1–0.9)
MONO%: 0.5 % (ref 0.0–14.0)
NEUT%: 94.8 % — ABNORMAL HIGH (ref 39.0–75.0)
NEUTROS ABS: 6.9 10*3/uL — AB (ref 1.5–6.5)
PLATELETS: 234 10*3/uL (ref 140–400)
RBC: 3.58 10*6/uL — AB (ref 4.20–5.82)
RDW: 15.4 % — ABNORMAL HIGH (ref 11.0–14.6)
WBC: 7.3 10*3/uL (ref 4.0–10.3)
lymph#: 0.3 10*3/uL — ABNORMAL LOW (ref 0.9–3.3)

## 2016-07-06 LAB — COMPREHENSIVE METABOLIC PANEL
ALT: 42 U/L (ref 0–55)
AST: 19 U/L (ref 5–34)
Albumin: 3.9 g/dL (ref 3.5–5.0)
Alkaline Phosphatase: 64 U/L (ref 40–150)
Anion Gap: 11 mEq/L (ref 3–11)
BILIRUBIN TOTAL: 0.34 mg/dL (ref 0.20–1.20)
BUN: 25.5 mg/dL (ref 7.0–26.0)
CO2: 25 meq/L (ref 22–29)
CREATININE: 1.2 mg/dL (ref 0.7–1.3)
Calcium: 9.5 mg/dL (ref 8.4–10.4)
Chloride: 104 mEq/L (ref 98–109)
EGFR: 72 mL/min/{1.73_m2} — ABNORMAL LOW (ref >90)
GLUCOSE: 138 mg/dL (ref 70–140)
Potassium: 3.9 mEq/L (ref 3.5–5.1)
SODIUM: 140 meq/L (ref 136–145)
TOTAL PROTEIN: 7.2 g/dL (ref 6.4–8.3)

## 2016-07-06 LAB — FERRITIN: Ferritin: 62 ng/ml (ref 22–316)

## 2016-07-06 LAB — IRON AND TIBC
%SAT: 12 % — AB (ref 20–55)
Iron: 46 ug/dL (ref 42–163)
TIBC: 396 ug/dL (ref 202–409)
UIBC: 350 ug/dL (ref 117–376)

## 2016-07-06 LAB — CEA (IN HOUSE-CHCC): CEA (CHCC-IN HOUSE): 13.69 ng/mL — AB (ref 0.00–5.00)

## 2016-07-06 SURGERY — INSERTION, TUNNELED CENTRAL VENOUS DEVICE, WITH PORT
Anesthesia: General

## 2016-07-06 MED ORDER — ACETAMINOPHEN 650 MG RE SUPP: 650.0000 mg | RECTAL | Status: DC | PRN

## 2016-07-06 MED ORDER — SODIUM CHLORIDE 0.9% FLUSH: 3.0000 mL | INTRAVENOUS | Status: DC | PRN

## 2016-07-06 MED ORDER — HEPARIN (PORCINE) IN NACL 2-0.9 UNIT/ML-% IJ SOLN
INTRAMUSCULAR | Status: AC
  Filled 2016-07-06: qty 500

## 2016-07-06 MED ORDER — BUPIVACAINE HCL (PF) 0.25 % IJ SOLN
INTRAMUSCULAR | Status: AC
  Filled 2016-07-06: qty 30

## 2016-07-06 MED ORDER — LIDOCAINE HCL (PF) 1 % IJ SOLN
INTRAMUSCULAR | Status: AC
  Filled 2016-07-06: qty 30

## 2016-07-06 MED ORDER — BUPIVACAINE-EPINEPHRINE (PF) 0.5% -1:200000 IJ SOLN
INTRAMUSCULAR | Status: AC
Start: 2016-07-06 — End: 2016-07-06
  Filled 2016-07-06: qty 30

## 2016-07-06 MED ORDER — DEXAMETHASONE SODIUM PHOSPHATE 10 MG/ML IJ SOLN
INTRAMUSCULAR | Status: AC
  Filled 2016-07-06: qty 1

## 2016-07-06 MED ORDER — LIDOCAINE-EPINEPHRINE (PF) 1 %-1:200000 IJ SOLN
INTRAMUSCULAR | Status: DC | PRN
  Administered 2016-07-06: 16 mL

## 2016-07-06 MED ORDER — LACTATED RINGERS IV SOLN
INTRAVENOUS | Status: DC
  Administered 2016-07-06 (×2): via INTRAVENOUS

## 2016-07-06 MED ORDER — HEPARIN SOD (PORK) LOCK FLUSH 100 UNIT/ML IV SOLN
INTRAVENOUS | Status: AC
  Filled 2016-07-06: qty 5

## 2016-07-06 MED ORDER — DEXAMETHASONE SODIUM PHOSPHATE 4 MG/ML IJ SOLN
INTRAMUSCULAR | Status: DC | PRN
  Administered 2016-07-06: 10 mg via INTRAVENOUS

## 2016-07-06 MED ORDER — CHLORHEXIDINE GLUCONATE CLOTH 2 % EX PADS: 6.0000 | MEDICATED_PAD | Freq: Once | CUTANEOUS | Status: DC

## 2016-07-06 MED ORDER — MIDAZOLAM HCL 2 MG/2ML IJ SOLN
INTRAMUSCULAR | Status: AC
  Filled 2016-07-06: qty 2

## 2016-07-06 MED ORDER — CIPROFLOXACIN IN D5W 400 MG/200ML IV SOLN
INTRAVENOUS | Status: AC
  Filled 2016-07-06: qty 200

## 2016-07-06 MED ORDER — ACETAMINOPHEN 325 MG PO TABS: 650.0000 mg | ORAL_TABLET | ORAL | Status: DC | PRN

## 2016-07-06 MED ORDER — PROPOFOL 10 MG/ML IV BOLUS
INTRAVENOUS | Status: DC | PRN
  Administered 2016-07-06: 200 mg via INTRAVENOUS

## 2016-07-06 MED ORDER — ONDANSETRON HCL 4 MG/2ML IJ SOLN
INTRAMUSCULAR | Status: DC | PRN
  Administered 2016-07-06: 4 mg via INTRAVENOUS

## 2016-07-06 MED ORDER — HEPARIN SOD (PORK) LOCK FLUSH 100 UNIT/ML IV SOLN
INTRAVENOUS | Status: DC | PRN
  Administered 2016-07-06: 500 [IU] via INTRAVENOUS

## 2016-07-06 MED ORDER — EPHEDRINE 5 MG/ML INJ
INTRAVENOUS | Status: AC
  Filled 2016-07-06: qty 10

## 2016-07-06 MED ORDER — CIPROFLOXACIN IN D5W 400 MG/200ML IV SOLN
400.0000 mg | INTRAVENOUS | Status: AC
  Administered 2016-07-06: 400 mg via INTRAVENOUS

## 2016-07-06 MED ORDER — SCOPOLAMINE 1 MG/3DAYS TD PT72: 1.0000 | MEDICATED_PATCH | Freq: Once | TRANSDERMAL | Status: DC | PRN

## 2016-07-06 MED ORDER — LIDOCAINE 2% (20 MG/ML) 5 ML SYRINGE
INTRAMUSCULAR | Status: DC | PRN
  Administered 2016-07-06: 100 mg via INTRAVENOUS

## 2016-07-06 MED ORDER — FENTANYL CITRATE (PF) 100 MCG/2ML IJ SOLN
INTRAMUSCULAR | Status: AC
  Filled 2016-07-06: qty 2

## 2016-07-06 MED ORDER — OXYCODONE HCL 5 MG PO TABS: 5.0000 mg | ORAL_TABLET | ORAL | Status: DC | PRN

## 2016-07-06 MED ORDER — LIDOCAINE 2% (20 MG/ML) 5 ML SYRINGE
INTRAMUSCULAR | Status: AC
  Filled 2016-07-06: qty 5

## 2016-07-06 MED ORDER — ONDANSETRON HCL 4 MG/2ML IJ SOLN
INTRAMUSCULAR | Status: AC
  Filled 2016-07-06: qty 2

## 2016-07-06 MED ORDER — STERILE WATER FOR IRRIGATION IR SOLN
Status: DC | PRN
  Administered 2016-07-06: 1000 mL

## 2016-07-06 MED ORDER — SODIUM CHLORIDE 0.9 % IV SOLN: 250.0000 mL | INTRAVENOUS | Status: DC | PRN

## 2016-07-06 MED ORDER — MIDAZOLAM HCL 2 MG/2ML IJ SOLN
1.0000 mg | INTRAMUSCULAR | Status: DC | PRN
  Administered 2016-07-06: 2 mg via INTRAVENOUS

## 2016-07-06 MED ORDER — FENTANYL CITRATE (PF) 100 MCG/2ML IJ SOLN
50.0000 ug | INTRAMUSCULAR | Status: DC | PRN
  Administered 2016-07-06: 100 ug via INTRAVENOUS

## 2016-07-06 MED ORDER — EPHEDRINE SULFATE 50 MG/ML IJ SOLN
INTRAMUSCULAR | Status: DC | PRN
  Administered 2016-07-06 (×2): 10 mg via INTRAVENOUS
  Administered 2016-07-06: 15 mg via INTRAVENOUS

## 2016-07-06 MED ORDER — SODIUM CHLORIDE 0.9% FLUSH: 3.0000 mL | Freq: Two times a day (BID) | INTRAVENOUS | Status: DC

## 2016-07-06 SURGICAL SUPPLY — 43 items
BAG DECANTER FOR FLEXI CONT (MISCELLANEOUS) ×3 IMPLANT
BLADE HEX COATED 2.75 (ELECTRODE) ×3 IMPLANT
BLADE SURG 11 STRL SS (BLADE) ×3 IMPLANT
BLADE SURG 15 STRL LF DISP TIS (BLADE) ×1 IMPLANT
BLADE SURG 15 STRL SS (BLADE) ×2
CHLORAPREP W/TINT 26ML (MISCELLANEOUS) ×3 IMPLANT
COVER BACK TABLE 60X90IN (DRAPES) ×3 IMPLANT
COVER MAYO STAND STRL (DRAPES) ×3 IMPLANT
DECANTER SPIKE VIAL GLASS SM (MISCELLANEOUS) ×3 IMPLANT
DERMABOND ADVANCED (GAUZE/BANDAGES/DRESSINGS) ×2
DERMABOND ADVANCED .7 DNX12 (GAUZE/BANDAGES/DRESSINGS) ×1 IMPLANT
DRAPE C-ARM 42X72 X-RAY (DRAPES) ×3 IMPLANT
DRAPE LAPAROTOMY TRNSV 102X78 (DRAPE) ×3 IMPLANT
DRAPE UTILITY XL STRL (DRAPES) ×3 IMPLANT
DRSG TEGADERM 4X4.75 (GAUZE/BANDAGES/DRESSINGS) ×6 IMPLANT
ELECT REM PT RETURN 9FT ADLT (ELECTROSURGICAL) ×3
ELECTRODE REM PT RTRN 9FT ADLT (ELECTROSURGICAL) ×1 IMPLANT
GLOVE BIO SURGEON STRL SZ 6 (GLOVE) ×3 IMPLANT
GLOVE BIO SURGEON STRL SZ7 (GLOVE) ×3 IMPLANT
GLOVE BIOGEL PI IND STRL 6.5 (GLOVE) ×1 IMPLANT
GLOVE BIOGEL PI IND STRL 7.5 (GLOVE) ×2 IMPLANT
GLOVE BIOGEL PI INDICATOR 6.5 (GLOVE) ×2
GLOVE BIOGEL PI INDICATOR 7.5 (GLOVE) ×4
GOWN STRL REUS W/ TWL LRG LVL3 (GOWN DISPOSABLE) ×1 IMPLANT
GOWN STRL REUS W/TWL 2XL LVL3 (GOWN DISPOSABLE) ×3 IMPLANT
GOWN STRL REUS W/TWL LRG LVL3 (GOWN DISPOSABLE) ×2
IV CONNECTOR ONE LINK NDLESS (IV SETS) IMPLANT
KIT PORT POWER 8FR ISP CVUE (Catheter) ×3 IMPLANT
NEEDLE HYPO 25X1 1.5 SAFETY (NEEDLE) ×3 IMPLANT
PACK BASIN DAY SURGERY FS (CUSTOM PROCEDURE TRAY) ×3 IMPLANT
PENCIL BUTTON HOLSTER BLD 10FT (ELECTRODE) ×3 IMPLANT
SLEEVE SCD COMPRESS KNEE MED (MISCELLANEOUS) ×3 IMPLANT
SPONGE GAUZE 4X4 12PLY STER LF (GAUZE/BANDAGES/DRESSINGS) ×3 IMPLANT
SUT MNCRL AB 4-0 PS2 18 (SUTURE) ×3 IMPLANT
SUT PROLENE 2 0 SH DA (SUTURE) ×6 IMPLANT
SUT VIC AB 3-0 SH 27 (SUTURE) ×2
SUT VIC AB 3-0 SH 27X BRD (SUTURE) ×1 IMPLANT
SUT VICRYL 3-0 CR8 SH (SUTURE) IMPLANT
SYR 10ML LL (SYRINGE) ×3 IMPLANT
SYR 5ML LUER SLIP (SYRINGE) ×3 IMPLANT
SYR CONTROL 10ML LL (SYRINGE) ×3 IMPLANT
TOWEL OR 17X24 6PK STRL BLUE (TOWEL DISPOSABLE) ×3 IMPLANT
TOWEL OR NON WOVEN STRL DISP B (DISPOSABLE) ×3 IMPLANT

## 2016-07-06 NOTE — Transfer of Care (Signed)
Immediate Anesthesia Transfer of Care Note  Patient: Nicholas Lowery  Procedure(s) Performed: Procedure(s): INSERTION PORT-A-CATH (N/A)  Patient Location: PACU  Anesthesia Type:General  Level of Consciousness: awake and alert   Airway & Oxygen Therapy: Patient Spontanous Breathing and Patient connected to face mask oxygen  Post-op Assessment: Report given to RN and Post -op Vital signs reviewed and stable  Post vital signs: Reviewed and stable  Last Vitals:  Vitals:   07/06/16 0814  BP: 134/85  Pulse: 69  Resp: 18  Temp: 36.7 C    Last Pain:  Vitals:   07/06/16 0814  TempSrc: Oral  PainSc: 0-No pain         Complications: No apparent anesthesia complications

## 2016-07-06 NOTE — Discharge Instructions (Addendum)
[place] Office Phone Number [phone]   POST OP INSTRUCTIONS  Always review your discharge instruction sheet given to you by the facility where your surgery was performed.  IF YOU HAVE DISABILITY OR FAMILY LEAVE FORMS, YOU MUST BRING THEM TO THE OFFICE FOR PROCESSING.  DO NOT GIVE THEM TO YOUR DOCTOR.  1. A prescription for pain medication may be given to you upon discharge.  Take your pain medication as prescribed, if needed.  If narcotic pain medicine is not needed, then you may take acetaminophen (Tylenol) or ibuprofen (Advil) as needed. 2. Take your usually prescribed medications unless otherwise directed 3. If you need a refill on your pain medication, please contact your pharmacy.  They will contact our office to request authorization.  Prescriptions will not be filled after 5pm or on week-ends. 4. You should eat very light the first 24 hours after surgery, such as soup, crackers, pudding, etc.  Resume your normal diet the day after surgery 5. It is common to experience some constipation if taking pain medication after surgery.  Increasing fluid intake and taking a stool softener will usually help or prevent this problem from occurring.  A mild laxative (Milk of Magnesia or Miralax) should be taken according to package directions if there are no bowel movements after 48 hours. 6. You may shower in 48 hours.  The surgical glue will flake off in 2-3 weeks.   7. ACTIVITIES:  No strenuous activity or heavy lifting for 1 week.   a. You may drive when you no longer are taking prescription pain medication, you can comfortably wear a seatbelt, and you can safely maneuver your car and apply brakes. b. RETURN TO WORK:  __________as tolerated._______________ Dennis Bast should see your doctor in the office for a follow-up appointment approximately three-four weeks after your surgery.    WHEN TO CALL YOUR DOCTOR: 1. Fever over 101.0 2. Nausea and/or vomiting. 3. Extreme swelling or  bruising. 4. Continued bleeding from incision. 5. Increased pain, redness, or drainage from the incision.  The clinic staff is available to answer your questions during regular business hours.  Please dont hesitate to call and ask to speak to one of the nurses for clinical concerns.  If you have a medical emergency, go to the nearest emergency room or call 911.  A surgeon from [facility] is always on call at the hospital.  For further questions, please visit [email or web address]    Post Anesthesia Home Care Instructions  Activity: Get plenty of rest for the remainder of the day. A responsible adult should stay with you for 24 hours following the procedure.  For the next 24 hours, DO NOT: -Drive a car -Paediatric nurse -Drink alcoholic beverages -Take any medication unless instructed by your physician -Make any legal decisions or sign important papers.  Meals: Start with liquid foods such as gelatin or soup. Progress to regular foods as tolerated. Avoid greasy, spicy, heavy foods. If nausea and/or vomiting occur, drink only clear liquids until the nausea and/or vomiting subsides. Call your physician if vomiting continues.  Special Instructions/Symptoms: Your throat may feel dry or sore from the anesthesia or the breathing tube placed in your throat during surgery. If this causes discomfort, gargle with warm salt water. The discomfort should disappear within 24 hours.  If you had a scopolamine patch placed behind your ear for the management of post- operative nausea and/or vomiting:  1. The medication in the patch is effective for 72 hours, after which it should  which it should be removed.  Wrap patch in a tissue and discard in the trash. Wash hands thoroughly with soap and water. °2. You may remove the patch earlier than 72 hours if you experience unpleasant side effects which may include dry mouth, dizziness or visual disturbances. °3. Avoid touching the patch. Wash your  hands with soap and water after contact with the patch. °  ° ° °

## 2016-07-06 NOTE — Anesthesia Preprocedure Evaluation (Signed)
Anesthesia Evaluation  Patient identified by MRN, date of birth, ID band Patient awake    Reviewed: Allergy & Precautions, NPO status , Patient's Chart, lab work & pertinent test results  History of Anesthesia Complications (+) MALIGNANT HYPERTHERMIA and POST - OP SPINAL HEADACHE  Airway Mallampati: II  TM Distance: >3 FB     Dental   Pulmonary former smoker,    breath sounds clear to auscultation       Cardiovascular negative cardio ROS   Rhythm:Regular Rate:Normal     Neuro/Psych    GI/Hepatic negative GI ROS, Neg liver ROS,   Endo/Other  negative endocrine ROS  Renal/GU negative Renal ROS     Musculoskeletal   Abdominal   Peds  Hematology  (+) anemia ,   Anesthesia Other Findings   Reproductive/Obstetrics                             Anesthesia Physical Anesthesia Plan  ASA: III  Anesthesia Plan: General   Post-op Pain Management:  Regional for Post-op pain   Induction: Intravenous  Airway Management Planned: Oral ETT  Additional Equipment:   Intra-op Plan:   Post-operative Plan: Extubation in OR  Informed Consent: I have reviewed the patients History and Physical, chart, labs and discussed the procedure including the risks, benefits and alternatives for the proposed anesthesia with the patient or authorized representative who has indicated his/her understanding and acceptance.   Dental advisory given  Plan Discussed with: CRNA and Anesthesiologist  Anesthesia Plan Comments:         Anesthesia Quick Evaluation

## 2016-07-06 NOTE — Anesthesia Procedure Notes (Signed)
Procedure Name: LMA Insertion Date/Time: 07/06/2016 9:18 AM Performed by: Lieutenant Diego Pre-anesthesia Checklist: Patient identified, Emergency Drugs available, Suction available and Patient being monitored Patient Re-evaluated:Patient Re-evaluated prior to inductionOxygen Delivery Method: Circle system utilized Preoxygenation: Pre-oxygenation with 100% oxygen Intubation Type: IV induction Ventilation: Mask ventilation without difficulty LMA: LMA inserted LMA Size: 5.0 Number of attempts: 1 Airway Equipment and Method: Bite block Placement Confirmation: positive ETCO2 and breath sounds checked- equal and bilateral Tube secured with: Tape Dental Injury: Teeth and Oropharynx as per pre-operative assessment

## 2016-07-06 NOTE — Progress Notes (Signed)
[place] Work  Clinical Social Work was referred by Futures trader and patient for assessment of psychosocial needs due to new cancer diagnosis and family concerns.  Clinical Social Worker met with patient and wife, Tye [place] at [facility] in [place] office at length to offer support and assess for needs.  CSW introduced self, explained role of CSW team/Support Team, provided packet with support group, counseling resources and support programs through [facility]. Family dealing with complex issues related to adolescent son and new diagnosis. CSW provided them with adolescent counseling resources and will meet with them next week at chemo. CSW highly encouraged them to use [facility] Support Team for support as well. CSW to follow.   Clinical Social Work interventions:  Resource education and referral Supportive counseling  Loren Racer, LCSW, OSW-C Clinical Social Worker [place]  [place] Phone: [phone] Fax: [phone]

## 2016-07-06 NOTE — Anesthesia Postprocedure Evaluation (Signed)
Anesthesia Post Note  Patient: Nicholas Lowery  Procedure(s) Performed: Procedure(s) (LRB): INSERTION PORT-A-CATH (N/A)  Patient location during evaluation: PACU Anesthesia Type: General Pain management: pain level controlled Vital Signs Assessment: post-procedure vital signs reviewed and stable Respiratory status: spontaneous breathing Cardiovascular status: stable Anesthetic complications: no       Last Vitals:  Vitals:   07/06/16 1030 07/06/16 1100  BP:  133/85  Pulse: 79 76  Resp: 11 18  Temp:  36.3 C    Last Pain:  Vitals:   07/06/16 1100  TempSrc:   PainSc: 0-No pain                 Cartez Mogle

## 2016-07-06 NOTE — Interval H&P Note (Signed)
History and Physical Interval Note:  07/06/2016 9:00 AM  Nicholas Lowery  has presented today for surgery, with the diagnosis of colon cancer  The various methods of treatment have been discussed with the patient and family. After consideration of risks, benefits and other options for treatment, the patient has consented to  Procedure(s): INSERTION PORT-A-CATH (N/A) as a surgical intervention .  The patient's history has been reviewed, patient examined, no change in status, stable for surgery.  I have reviewed the patient's chart and labs.  Questions were answered to the patient's satisfaction.     [name]

## 2016-07-06 NOTE — Op Note (Signed)
PREOPERATIVE DIAGNOSIS:  Stage IV colon cancer     POSTOPERATIVE DIAGNOSIS:  Same     PROCEDURE: left subclavian port placement, Bard ClearVue  Power Port, MRI safe, 8-French.      SURGEON:  [name], MD      ANESTHESIA:  General   FINDINGS:  Good venous return, easy flush, and tip of the catheter and   SVC 26 cm.      SPECIMEN:  None.      ESTIMATED BLOOD LOSS:  Minimal.      COMPLICATIONS:  None known.      PROCEDURE:  Pt was identified in the holding area and taken to   the operating room, where patient was placed supine on the operating room   table.  General anesthesia was induced.  Patient's arms were tucked and the upper   chest and neck were prepped and draped in sterile fashion.  Time-out was   performed according to the surgical safety check list.  When all was   correct, we continued.   Local anesthetic was administered over this   area at the angle of the clavicle.  The vein was accessed with 3 passes of the needle. There was good venous return and the wire passed easily with no ectopy.   Fluoroscopy was used to confirm that the wire was in the vena cava.      The patient was placed back level and the area for the pocket was anethetized   with local anesthetic.  A 3-cm transverse incision was made with a #15   blade.  Cautery was used to divide the subcutaneous tissues down to the   pectoralis muscle.  An [employer]-Nav[employer] retractor was used to elevate the skin   while a pocket was created on top of the pectoralis fascia.  The port   was placed into the pocket to confirm that it was of adequate size.  The   catheter was preattached to the port.  The port was then secured to the   pectoralis fascia with four 2-0 Prolene sutures.  These were clamped and   not tied down yet.    The catheter was tunneled through to the wire exit   site.  The catheter was placed along the wire to determine what length it should be to be in the SVC.  The catheter was cut at 26 cm.  The  tunneler sheath and dilator were passed over the wire and the dilator and wire were removed.  The catheter was advanced through the tunneler sheath and the tunneler sheath was pulled away.  Care was taken to keep the catheter in the tunneler sheath as this occurred. This was advanced and the tunneler sheath was removed.  There was good venous   return and easy flush of the catheter.  The Prolene sutures were tied   down to the pectoral fascia.  The skin was reapproximated using 3-0   Vicryl interrupted deep dermal sutures.    Fluoroscopy was used to re-confirm good position of the catheter.  The skin   was then closed using 4-0 Monocryl in a subcuticular fashion.  The port was flushed with concentrated heparin flush as well.  The wounds were then cleaned, dried, and dressed with Dermabond.  The patient was awakened from anesthesia and taken to the PACU in stable condition.  Needle, sponge, and instrument counts were correct.               [name], MD

## 2016-07-06 NOTE — H&P (View-Only) (Signed)
Nicholas Lowery 06/12/2016 3:19 PM Location: [facility] Patient #: [id number] DOB: 1960-10-18 Married / Language: [name] / Race: White Male  History of Present Illness [name] MD; 06/12/2016 6:25 PM) The patient is a 56 year old male who presents with colorectal cancer. Pt is a 56 yo M referred for consultation by [name] for new diagnosis of colon cancer with near obstructing ascending colon mass and hot lesion on PET in the right liver. He started having problems in December after his family had a GI bug. He had an episode of severe crampy abdominal pain and diarrhea. He was seen at urgent care by a PA and reportedly his labs were OK. CT was recommended. He had another episode of pain a few days later and was seen at [facility]. Felt like maybe just gastroenteritis. However, he continued not to feel well and followed up with his PCP. Due to the duration of his symptoms, a CT was performed at [facility] showing thickening of colon, apple core like lesion in ascending colon, and 1.7 cm right inferior hepatic lobe mass. He was referred to [name].   He noted that his appetite had decreased. He has lost around 30 pounds in the last 2 months. He was planning on going straight for chemo, but developed additional cramping pain and belching. The small bowel was dilated as well. He has been on full liquids for the last 5 days or so with improvement in symptoms. He has had some diarrhea during this time as well. He denies rectal bleeding.   PET showed right colon mass, liver mass and LAD.   Of note, he had colonoscopy by Dr. Ladona Horns in december 2016 with several polyps including one with cancer. This was done at [facility].   Colonoscopy by Dr. Ladona Horns. It showed showed 2 sessile polyps ready between 3-5 mm in size located 20 cm (A, B) from the point of entry, polypectomy was performed. Pedunculated polyp was found in the ascending colon (C), polypectomy was performed, and  additional polyp (D) was found 30 cm from the point of entry, removed  PET IMPRESSION: Approximately 3 cm hypermetabolic mass in the ascending colon, consistent with primary colon carcinoma. This mass results in colonic obstruction and small bowel dilatation. Additional areas of hypermetabolic wall thickening in the cecum may represent other sites of colon carcinoma or colitis .  Mild hypermetabolic lymphadenopathy in right pericolonic region, porta hepatis, and aortocaval space, consistent with metastatic disease. Mild hypermetabolic mediastinal lymphadenopathy also seen, and thoracic lymph node metastases cannot be excluded.  Solitary hypermetabolic focus in inferior right hepatic lobe, consistent with liver metastasis. Consider abdomen MRI without and with contrast for further evaluation.    Past Surgical History Patsey Berthold, [place]; 06/12/2016 3:19 PM) Appendectomy Knee Surgery Right. Open Inguinal Hernia Surgery Right. Vasectomy  Diagnostic Studies History Patsey Berthold, [place]; 06/12/2016 3:19 PM) Colonoscopy 1-5 years ago  Allergies Patsey Berthold, [place]; 06/12/2016 3:20 PM) Penicillins  Medication History Patsey Berthold, CMA; 06/12/2016 3:22 PM) Celecoxib (200MG  Capsule, Oral daily) Active. Ondansetron (8MG  Tablet Disint, Oral as needed) Active. Promethazine HCl (25MG  Tablet, Oral as needed) Active. FLUoxetine HCl (40MG  Capsule, Oral daily) Active. Diphenoxylate-Atropine (2.5-0.025MG  Tablet, Oral as needed) Active. Colace (100MG  Capsule, Oral daily) Active. Iron (325 (65 Fe)MG Tablet, Oral daily) Active. Multivitamin (Oral daily) Active. Probiotic Product (Oral daily) Active. Medications Reconciled  Social History Patsey Berthold, CMA; 06/12/2016 3:19 PM) Alcohol use Moderate alcohol use. Caffeine use Carbonated beverages, Tea. Illicit drug use Remotely quit drug  use.  Family History Patsey Berthold, [place]; 06/12/2016 3:19 PM) Cancer  Mother. Cerebrovascular Accident Mother. Diabetes Mellitus Brother. Hypertension Father. Kidney Disease Father. Respiratory Condition Mother.  Other Problems Patsey Berthold, [place]; 06/12/2016 3:19 PM) Colon Cancer Inguinal Hernia     Review of Systems Patsey Berthold CMA; 06/12/2016 3:19 PM) General Present- Appetite Loss and Weight Loss. Not Present- Chills, Fatigue, Fever, Night Sweats and Weight Gain. Skin Present- Dryness. Not Present- Change in Wart/Mole, Hives, Jaundice, New Lesions, Non-Healing Wounds, Rash and Ulcer. HEENT Present- Seasonal Allergies. Not Present- Earache, Hearing Loss, Hoarseness, Nose Bleed, Oral Ulcers, Ringing in the Ears, Sinus Pain, Sore Throat, Visual Disturbances, Wears glasses/contact lenses and Yellow Eyes. Respiratory Present- Snoring. Not Present- Bloody sputum, Chronic Cough, Difficulty Breathing and Wheezing. Cardiovascular Not Present- Chest Pain, Difficulty Breathing Lying Down, Leg Cramps, Palpitations, Rapid Heart Rate, Shortness of Breath and Swelling of Extremities. Gastrointestinal Present- Abdominal Pain, Bloating, Bloody Stool and Change in Bowel Habits. Not Present- Chronic diarrhea, Constipation, Difficulty Swallowing, Excessive gas, Gets full quickly at meals, Hemorrhoids, Indigestion, Nausea, Rectal Pain and Vomiting. Male Genitourinary Not Present- Blood in Urine, Change in Urinary Stream, Frequency, Impotence, Nocturia, Painful Urination, Urgency and Urine Leakage. Musculoskeletal Not Present- Back Pain, Joint Pain, Joint Stiffness, Muscle Pain, Muscle Weakness and Swelling of Extremities. Neurological Not Present- Decreased Memory, Fainting, Headaches, Numbness, Seizures, Tingling, Tremor, Trouble walking and Weakness. Psychiatric Present- Anxiety. Not Present- Bipolar, Change in Sleep Pattern, Depression, Fearful and Frequent crying. Endocrine Not Present- Cold Intolerance, Excessive Hunger, Hair Changes, Heat Intolerance, Hot flashes  and New Diabetes. Hematology Not Present- Blood Thinners, Easy Bruising, Excessive bleeding, Gland problems, HIV and Persistent Infections.  Vitals Patsey Berthold CMA; 06/12/2016 3:22 PM) 06/12/2016 3:22 PM Weight: 193.2 lb Height: 71in Height was reported by patient. Body Surface Area: 2.08 m Body Mass Index: 26.95 kg/m  Temp.: 97.92F  Pulse: 79 (Regular)  BP: 142/92 (Sitting, Left Arm, Standard)      Physical Exam [name] MD; 06/12/2016 6:13 PM)  General Mental Status-Alert. General Appearance-Consistent with stated age. Hydration-Well hydrated. Voice-Normal.  Head and Neck Head-normocephalic, atraumatic with no lesions or palpable masses. Trachea-midline. Thyroid Gland Characteristics - normal size and consistency.  Eye Eyeball - Bilateral-Extraocular movements intact. Sclera/Conjunctiva - Bilateral-No scleral icterus.  Chest and Lung Exam Chest and lung exam reveals -quiet, even and easy respiratory effort with no use of accessory muscles and on auscultation, normal breath sounds, no adventitious sounds and normal vocal resonance. Inspection Chest Wall - Normal. Back - normal.  Cardiovascular Cardiovascular examination reveals -normal heart sounds, regular rate and rhythm with no murmurs and normal pedal pulses bilaterally.  Abdomen Inspection Inspection of the abdomen reveals - No Hernias. Palpation/Percussion Palpation and Percussion of the abdomen reveal - Soft, Non Tender, No Rebound tenderness, No Rigidity (guarding) and No hepatosplenomegaly. Auscultation Auscultation of the abdomen reveals - Bowel sounds normal.  Neurologic Neurologic evaluation reveals -alert and oriented x 3 with no impairment of recent or remote memory. Mental Status-Normal.  Musculoskeletal Global Assessment -Note:no gross deformities.  Normal Exam - Left-Upper Extremity Strength Normal and Lower Extremity Strength Normal. Normal  Exam - Right-Upper Extremity Strength Normal and Lower Extremity Strength Normal.  Lymphatic Head & Neck  General Head & Neck Lymphatics: Bilateral - Description - Normal. Axillary  General Axillary Region: Bilateral - Description - Normal. Tenderness - Non Tender. Femoral & Inguinal  Generalized Femoral & Inguinal Lymphatics: Bilateral - Description - No Generalized lymphadenopathy.    Assessment & Plan [name] MD;  06/12/2016 6:24 PM)  COLON CANCER, ASCENDING (C18.2) Impression: Due to patient's obstructive symptoms, we will plan lap ascending colectomy. If possible we will do intraoperative liver biopsy. If not visible at time of operation, I will order liver biopsy in house with IR.  I reviewed pros and cons of up front resection of the primary vs staying on liquids and doing chemo first. Overall, I think since he had weight loss, dilation of small bowel and such significant cramping with regular diet that he would benefit from colectomy first.  Since he has been on liquids for 5 days, I am just going to do a few suppositories for bowel prep. I do not want him to try to drink a large burden of fluid while partially obstructed.  I reviewed surgery with the patient. I will make all efforts to complete this laparoscopically. However I informed him that sometimes the level of lymphatic involvement can lead to difficulty getting the blood vessels controlled laparoscopically. I informed him that I would do hand port or open if needed to get out the mass safely. I discussed that the abdominal wall may be involved and if so, he might be recommended to get radiation.  I also reviewed risks of leak, bleeding, infection, damage to adjacent structures, hernia, wound dehiscence, possible need for additional surgeries or procedures, possible ostomy, possible blood clot, heart or lung complications, urinary retention, possible death. I discussed how we approach pain control. I reviewed post op  restrictions and recovery. I also stated that I would not plan port at the same time as colon surgery due to infectious risk.  Over 60 minutes were spent in counseling with the patient and his wife. Diagrams of anatamy were reviewed.  Current Plans Pt Education - flb right colectomy: discussed with patient and provided information. Schedule for Surgery LIVER MASS, RIGHT LOBE (R16.0) Impression: I would defer decision for possible surgical treatment of the liver until later. I am concerned about the potential burden of lymphatic disease. I would recommend post op chemo and then reimage. Some of the lymphadenopathy may be inflammatory in nature. Colectomy would definitely get all the nodes resected.  However, if the aortocaval nodes are positive, this would make it unlikely to achieve a curative intent with right hepatectomy. In that case, we could consider ablation. We will discuss him in multidisciplinary fashion post op and post chemo.

## 2016-07-07 ENCOUNTER — Encounter (HOSPITAL_BASED_OUTPATIENT_CLINIC_OR_DEPARTMENT_OTHER): Payer: Self-pay | Admitting: General Surgery

## 2016-07-10 ENCOUNTER — Other Ambulatory Visit (HOSPITAL_COMMUNITY)
Admission: RE | Admit: 2016-07-10 | Discharge: 2016-07-10 | Disposition: A | Discharge status: Home / Self Care | Payer: BLUE CROSS/BLUE SHIELD | Source: Ambulatory Visit | Attending: Hematology | Admitting: Hematology

## 2016-07-10 DIAGNOSIS — C189 Malignant neoplasm of colon, unspecified: Secondary | ICD-10-CM | POA: Diagnosis present

## 2016-07-11 ENCOUNTER — Encounter: Payer: Self-pay | Admitting: Pharmacist

## 2016-07-12 ENCOUNTER — Other Ambulatory Visit: Payer: Self-pay | Admitting: Hematology

## 2016-07-12 ENCOUNTER — Telehealth: Payer: Self-pay | Admitting: *Deleted

## 2016-07-12 NOTE — Telephone Encounter (Signed)
I called back and left a message on his phone: he still has liver metastasis, that's why his CEA is still elevated. I will try to stop by at the infusion room to see him tomorrow morning in case he has more questions.   Truitt Merle MD

## 2016-07-12 NOTE — Telephone Encounter (Signed)
"  I saw on MyChart last night the CEA level has increased to 13.69.  I'm concerned.  Would like to speak with someone.  I had surgery and all tumor was removed.   I'm in [place] for Training today.  I can be reached at [phone].  First chemotherapy is tomorrow."

## 2016-07-13 ENCOUNTER — Ambulatory Visit (HOSPITAL_BASED_OUTPATIENT_CLINIC_OR_DEPARTMENT_OTHER): Payer: BLUE CROSS/BLUE SHIELD

## 2016-07-13 ENCOUNTER — Other Ambulatory Visit (HOSPITAL_BASED_OUTPATIENT_CLINIC_OR_DEPARTMENT_OTHER): Payer: BLUE CROSS/BLUE SHIELD

## 2016-07-13 ENCOUNTER — Encounter: Payer: Self-pay | Admitting: *Deleted

## 2016-07-13 VITALS — BP 128/93 | HR 91 | Temp 98.5°F | Resp 18

## 2016-07-13 DIAGNOSIS — C182 Malignant neoplasm of ascending colon: Secondary | ICD-10-CM

## 2016-07-13 DIAGNOSIS — C787 Secondary malignant neoplasm of liver and intrahepatic bile duct: Secondary | ICD-10-CM

## 2016-07-13 DIAGNOSIS — C189 Malignant neoplasm of colon, unspecified: Secondary | ICD-10-CM

## 2016-07-13 DIAGNOSIS — Z5111 Encounter for antineoplastic chemotherapy: Secondary | ICD-10-CM | POA: Diagnosis not present

## 2016-07-13 LAB — COMPREHENSIVE METABOLIC PANEL
ALT: 37 U/L (ref 0–55)
AST: 20 U/L (ref 5–34)
Albumin: 3.8 g/dL (ref 3.5–5.0)
Alkaline Phosphatase: 66 U/L (ref 40–150)
Anion Gap: 9 mEq/L (ref 3–11)
BILIRUBIN TOTAL: 0.37 mg/dL (ref 0.20–1.20)
BUN: 27.4 mg/dL — ABNORMAL HIGH (ref 7.0–26.0)
CO2: 26 meq/L (ref 22–29)
Calcium: 9.8 mg/dL (ref 8.4–10.4)
Chloride: 107 mEq/L (ref 98–109)
Creatinine: 1 mg/dL (ref 0.7–1.3)
EGFR: 88 mL/min/{1.73_m2} — ABNORMAL LOW (ref >90)
GLUCOSE: 102 mg/dL (ref 70–140)
Potassium: 4.2 mEq/L (ref 3.5–5.1)
SODIUM: 142 meq/L (ref 136–145)
TOTAL PROTEIN: 7 g/dL (ref 6.4–8.3)

## 2016-07-13 LAB — CBC WITH DIFFERENTIAL/PLATELET
BASO%: 1 % (ref 0.0–2.0)
Basophils Absolute: 0 10*3/uL (ref 0.0–0.1)
EOS%: 4.9 % (ref 0.0–7.0)
Eosinophils Absolute: 0.2 10*3/uL (ref 0.0–0.5)
HCT: 34.7 % — ABNORMAL LOW (ref 38.4–49.9)
HGB: 11.3 g/dL — ABNORMAL LOW (ref 13.0–17.1)
LYMPH%: 23.5 % (ref 14.0–49.0)
MCH: 29.5 pg (ref 27.2–33.4)
MCHC: 32.6 g/dL (ref 32.0–36.0)
MCV: 90.6 fL (ref 79.3–98.0)
MONO#: 0.5 10*3/uL (ref 0.1–0.9)
MONO%: 13.2 % (ref 0.0–14.0)
NEUT%: 57.4 % (ref 39.0–75.0)
NEUTROS ABS: 2.2 10*3/uL (ref 1.5–6.5)
Platelets: 207 10*3/uL (ref 140–400)
RBC: 3.83 10*6/uL — AB (ref 4.20–5.82)
RDW: 15.1 % — ABNORMAL HIGH (ref 11.0–14.6)
WBC: 3.9 10*3/uL — ABNORMAL LOW (ref 4.0–10.3)
lymph#: 0.9 10*3/uL (ref 0.9–3.3)

## 2016-07-13 MED ORDER — SODIUM CHLORIDE 0.9% FLUSH
10.0000 mL | INTRAVENOUS | Status: DC | PRN
  Filled 2016-07-13: qty 10

## 2016-07-13 MED ORDER — DEXTROSE 5 % IV SOLN
Freq: Once | INTRAVENOUS | Status: AC
  Administered 2016-07-13: 10:00:00 via INTRAVENOUS

## 2016-07-13 MED ORDER — HEPARIN SOD (PORK) LOCK FLUSH 100 UNIT/ML IV SOLN
500.0000 [IU] | Freq: Once | INTRAVENOUS | Status: DC | PRN
  Filled 2016-07-13: qty 5

## 2016-07-13 MED ORDER — DEXAMETHASONE SODIUM PHOSPHATE 10 MG/ML IJ SOLN
INTRAMUSCULAR | Status: AC
  Filled 2016-07-13: qty 1

## 2016-07-13 MED ORDER — OXALIPLATIN CHEMO INJECTION 100 MG/20ML
85.0000 mg/m2 | Freq: Once | INTRAVENOUS | Status: AC
  Administered 2016-07-13: 180 mg via INTRAVENOUS
  Filled 2016-07-13: qty 36

## 2016-07-13 MED ORDER — PALONOSETRON HCL INJECTION 0.25 MG/5ML
INTRAVENOUS | Status: AC
  Filled 2016-07-13: qty 5

## 2016-07-13 MED ORDER — LEUCOVORIN CALCIUM INJECTION 350 MG
400.0000 mg/m2 | Freq: Once | INTRAMUSCULAR | Status: AC
  Administered 2016-07-13: 840 mg via INTRAVENOUS
  Filled 2016-07-13: qty 42

## 2016-07-13 MED ORDER — SODIUM CHLORIDE 0.9 % IV SOLN
2375.0000 mg/m2 | INTRAVENOUS | Status: DC
  Administered 2016-07-13: 5000 mg via INTRAVENOUS
  Filled 2016-07-13: qty 100

## 2016-07-13 MED ORDER — FLUOROURACIL CHEMO INJECTION 2.5 GM/50ML
400.0000 mg/m2 | Freq: Once | INTRAVENOUS | Status: AC
  Administered 2016-07-13: 850 mg via INTRAVENOUS
  Filled 2016-07-13: qty 17

## 2016-07-13 MED ORDER — PALONOSETRON HCL INJECTION 0.25 MG/5ML
0.2500 mg | Freq: Once | INTRAVENOUS | Status: AC
  Administered 2016-07-13: 0.25 mg via INTRAVENOUS

## 2016-07-13 MED ORDER — DEXAMETHASONE SODIUM PHOSPHATE 10 MG/ML IJ SOLN
10.0000 mg | Freq: Once | INTRAMUSCULAR | Status: AC
  Administered 2016-07-13: 10 mg via INTRAVENOUS

## 2016-07-13 NOTE — Patient Instructions (Signed)
[facility] Discharge Instructions for Patients Receiving Chemotherapy  Today you received the following chemotherapy agents Oxaliplatin, Adrucil and Leucovorin  To help prevent nausea and vomiting after your treatment, we encourage you to take your nausea medication as detailed under the medication section of this discharge section.  If you develop nausea and vomiting that is not controlled by your nausea medication, call the clinic.   BELOW ARE SYMPTOMS THAT SHOULD BE REPORTED IMMEDIATELY:  *FEVER GREATER THAN 100.5 F  *CHILLS WITH OR WITHOUT FEVER  NAUSEA AND VOMITING THAT IS NOT CONTROLLED WITH YOUR NAUSEA MEDICATION  *UNUSUAL SHORTNESS OF BREATH  *UNUSUAL BRUISING OR BLEEDING  TENDERNESS IN MOUTH AND THROAT WITH OR WITHOUT PRESENCE OF ULCERS  *URINARY PROBLEMS  *BOWEL PROBLEMS  UNUSUAL RASH Items with * indicate a potential emergency and should be followed up as soon as possible.  Feel free to call the clinic you have any questions or concerns. The clinic phone number is [phone].  Please show the [place] at check-in to the Emergency Department and triage nurse.  Oxaliplatin Injection What is this medicine? OXALIPLATIN (ox AL i PLA tin) is a chemotherapy drug. It targets fast dividing cells, like cancer cells, and causes these cells to die. This medicine is used to treat cancers of the colon and rectum, and many other cancers. This medicine may be used for other purposes; ask your health care provider or pharmacist if you have questions. COMMON BRAND NAME(S): Eloxatin What should I tell my health care provider before I take this medicine? They need to know if you have any of these conditions: -kidney disease -an unusual or allergic reaction to oxaliplatin, other chemotherapy, other medicines, foods, dyes, or preservatives -pregnant or trying to get pregnant -breast-feeding How should I use this medicine? This drug is given as an infusion  into a vein. It is administered in a hospital or clinic by a specially trained health care professional. Talk to your pediatrician regarding the use of this medicine in children. Special care may be needed. Overdosage: If you think you have taken too much of this medicine contact a poison control center or emergency room at once. NOTE: This medicine is only for you. Do not share this medicine with others. What if I miss a dose? It is important not to miss a dose. Call your doctor or health care professional if you are unable to keep an appointment. What may interact with this medicine? -medicines to increase blood counts like filgrastim, pegfilgrastim, sargramostim -probenecid -some antibiotics like amikacin, gentamicin, neomycin, polymyxin B, streptomycin, tobramycin -zalcitabine Talk to your doctor or health care professional before taking any of these medicines: -acetaminophen -aspirin -ibuprofen -ketoprofen -naproxen This list may not describe all possible interactions. Give your health care provider a list of all the medicines, herbs, non-prescription drugs, or dietary supplements you use. Also tell them if you smoke, drink alcohol, or use illegal drugs. Some items may interact with your medicine. What should I watch for while using this medicine? Your condition will be monitored carefully while you are receiving this medicine. You will need important blood work done while you are taking this medicine. This medicine can make you more sensitive to cold. Do not drink cold drinks or use ice. Cover exposed skin before coming in contact with cold temperatures or cold objects. When out in cold weather wear warm clothing and cover your mouth and nose to warm the air that goes into your lungs. Tell your doctor if  you get sensitive to the cold. This drug may make you feel generally unwell. This is not uncommon, as chemotherapy can affect healthy cells as well as cancer cells. Report any side  effects. Continue your course of treatment even though you feel ill unless your doctor tells you to stop. In some cases, you may be given additional medicines to help with side effects. Follow all directions for their use. Call your doctor or health care professional for advice if you get a fever, chills or sore throat, or other symptoms of a cold or flu. Do not treat yourself. This drug decreases your body's ability to fight infections. Try to avoid being around people who are sick. This medicine may increase your risk to bruise or bleed. Call your doctor or health care professional if you notice any unusual bleeding. Be careful brushing and flossing your teeth or using a toothpick because you may get an infection or bleed more easily. If you have any dental work done, tell your dentist you are receiving this medicine. Avoid taking products that contain aspirin, acetaminophen, ibuprofen, naproxen, or ketoprofen unless instructed by your doctor. These medicines may hide a fever. Do not become pregnant while taking this medicine. Women should inform their doctor if they wish to become pregnant or think they might be pregnant. There is a potential for serious side effects to an unborn child. Talk to your health care professional or pharmacist for more information. Do not breast-feed an infant while taking this medicine. Call your doctor or health care professional if you get diarrhea. Do not treat yourself. What side effects may I notice from receiving this medicine? Side effects that you should report to your doctor or health care professional as soon as possible: -allergic reactions like skin rash, itching or hives, swelling of the face, lips, or tongue -low blood counts - This drug may decrease the number of white blood cells, red blood cells and platelets. You may be at increased risk for infections and bleeding. -signs of infection - fever or chills, cough, sore throat, pain or difficulty passing  urine -signs of decreased platelets or bleeding - bruising, pinpoint red spots on the skin, black, tarry stools, nosebleeds -signs of decreased red blood cells - unusually weak or tired, fainting spells, lightheadedness -breathing problems -chest pain, pressure -cough -diarrhea -jaw tightness -mouth sores -nausea and vomiting -pain, swelling, redness or irritation at the injection site -pain, tingling, numbness in the hands or feet -problems with balance, talking, walking -redness, blistering, peeling or loosening of the skin, including inside the mouth -trouble passing urine or change in the amount of urine Side effects that usually do not require medical attention (report to your doctor or health care professional if they continue or are bothersome): -changes in vision -constipation -hair loss -loss of appetite -metallic taste in the mouth or changes in taste -stomach pain This list may not describe all possible side effects. Call your doctor for medical advice about side effects. You may report side effects to FDA at 1-800-FDA-1088. Where should I keep my medicine? This drug is given in a hospital or clinic and will not be stored at home. NOTE: This sheet is a summary. It may not cover all possible information. If you have questions about this medicine, talk to your doctor, pharmacist, or health care provider.  2018 Elsevier/Gold Standard (2007-11-26 17:22:47)   Leucovorin injection What is this medicine? LEUCOVORIN (loo koe VOR in) is used to prevent or treat the harmful effects of some  medicines. This medicine is used to treat anemia caused by a low amount of folic acid in the body. It is also used with 5-fluorouracil (5-FU) to treat colon cancer. This medicine may be used for other purposes; ask your health care provider or pharmacist if you have questions. What should I tell my health care provider before I take this medicine? They need to know if you have any of these  conditions: -anemia from low levels of vitamin B-12 in the blood -an unusual or allergic reaction to leucovorin, folic acid, other medicines, foods, dyes, or preservatives -pregnant or trying to get pregnant -breast-feeding How should I use this medicine? This medicine is for injection into a muscle or into a vein. It is given by a health care professional in a hospital or clinic setting. Talk to your pediatrician regarding the use of this medicine in children. Special care may be needed. Overdosage: If you think you have taken too much of this medicine contact a poison control center or emergency room at once. NOTE: This medicine is only for you. Do not share this medicine with others. What if I miss a dose? This does not apply. What may interact with this medicine? -capecitabine -fluorouracil -phenobarbital -phenytoin -primidone -trimethoprim-sulfamethoxazole This list may not describe all possible interactions. Give your health care provider a list of all the medicines, herbs, non-prescription drugs, or dietary supplements you use. Also tell them if you smoke, drink alcohol, or use illegal drugs. Some items may interact with your medicine. What should I watch for while using this medicine? Your condition will be monitored carefully while you are receiving this medicine. This medicine may increase the side effects of 5-fluorouracil, 5-FU. Tell your doctor or health care professional if you have diarrhea or mouth sores that do not get better or that get worse. What side effects may I notice from receiving this medicine? Side effects that you should report to your doctor or health care professional as soon as possible: -allergic reactions like skin rash, itching or hives, swelling of the face, lips, or tongue -breathing problems -fever, infection -mouth sores -unusual bleeding or bruising -unusually weak or tired Side effects that usually do not require medical attention (report to  your doctor or health care professional if they continue or are bothersome): -constipation or diarrhea -loss of appetite -nausea, vomiting This list may not describe all possible side effects. Call your doctor for medical advice about side effects. You may report side effects to FDA at 1-800-FDA-1088. Where should I keep my medicine? This drug is given in a hospital or clinic and will not be stored at home. NOTE: This sheet is a summary. It may not cover all possible information. If you have questions about this medicine, talk to your doctor, pharmacist, or health care provider.  2018 Elsevier/Gold Standard (2007-11-05 16:50:29)  Fluorouracil, 5-FU injection What is this medicine? FLUOROURACIL, 5-FU (flure oh YOOR a sil) is a chemotherapy drug. It slows the growth of cancer cells. This medicine is used to treat many types of cancer like breast cancer, colon or rectal cancer, pancreatic cancer, and stomach cancer. This medicine may be used for other purposes; ask your health care provider or pharmacist if you have questions. COMMON BRAND NAME(S): Adrucil What should I tell my health care provider before I take this medicine? They need to know if you have any of these conditions: -blood disorders -dihydropyrimidine dehydrogenase (DPD) deficiency -infection (especially a virus infection such as chickenpox, cold sores, or herpes) -kidney disease -  liver disease -malnourished, poor nutrition -recent or ongoing radiation therapy -an unusual or allergic reaction to fluorouracil, other chemotherapy, other medicines, foods, dyes, or preservatives -pregnant or trying to get pregnant -breast-feeding How should I use this medicine? This drug is given as an infusion or injection into a vein. It is administered in a hospital or clinic by a specially trained health care professional. Talk to your pediatrician regarding the use of this medicine in children. Special care may be needed. Overdosage: If  you think you have taken too much of this medicine contact a poison control center or emergency room at once. NOTE: This medicine is only for you. Do not share this medicine with others. What if I miss a dose? It is important not to miss your dose. Call your doctor or health care professional if you are unable to keep an appointment. What may interact with this medicine? -allopurinol -cimetidine -dapsone -digoxin -hydroxyurea -leucovorin -levamisole -medicines for seizures like ethotoin, fosphenytoin, phenytoin -medicines to increase blood counts like filgrastim, pegfilgrastim, sargramostim -medicines that treat or prevent blood clots like warfarin, enoxaparin, and dalteparin -methotrexate -metronidazole -pyrimethamine -some other chemotherapy drugs like busulfan, cisplatin, estramustine, vinblastine -trimethoprim -trimetrexate -vaccines Talk to your doctor or health care professional before taking any of these medicines: -acetaminophen -aspirin -ibuprofen -ketoprofen -naproxen This list may not describe all possible interactions. Give your health care provider a list of all the medicines, herbs, non-prescription drugs, or dietary supplements you use. Also tell them if you smoke, drink alcohol, or use illegal drugs. Some items may interact with your medicine. What should I watch for while using this medicine? Visit your doctor for checks on your progress. This drug may make you feel generally unwell. This is not uncommon, as chemotherapy can affect healthy cells as well as cancer cells. Report any side effects. Continue your course of treatment even though you feel ill unless your doctor tells you to stop. In some cases, you may be given additional medicines to help with side effects. Follow all directions for their use. Call your doctor or health care professional for advice if you get a fever, chills or sore throat, or other symptoms of a cold or flu. Do not treat yourself. This  drug decreases your body's ability to fight infections. Try to avoid being around people who are sick. This medicine may increase your risk to bruise or bleed. Call your doctor or health care professional if you notice any unusual bleeding. Be careful brushing and flossing your teeth or using a toothpick because you may get an infection or bleed more easily. If you have any dental work done, tell your dentist you are receiving this medicine. Avoid taking products that contain aspirin, acetaminophen, ibuprofen, naproxen, or ketoprofen unless instructed by your doctor. These medicines may hide a fever. Do not become pregnant while taking this medicine. Women should inform their doctor if they wish to become pregnant or think they might be pregnant. There is a potential for serious side effects to an unborn child. Talk to your health care professional or pharmacist for more information. Do not breast-feed an infant while taking this medicine. Men should inform their doctor if they wish to father a child. This medicine may lower sperm counts. Do not treat diarrhea with over the counter products. Contact your doctor if you have diarrhea that lasts more than 2 days or if it is severe and watery. This medicine can make you more sensitive to the sun. Keep out of the sun. If  you cannot avoid being in the sun, wear protective clothing and use sunscreen. Do not use sun lamps or tanning beds/booths. What side effects may I notice from receiving this medicine? Side effects that you should report to your doctor or health care professional as soon as possible: -allergic reactions like skin rash, itching or hives, swelling of the face, lips, or tongue -low blood counts - this medicine may decrease the number of white blood cells, red blood cells and platelets. You may be at increased risk for infections and bleeding. -signs of infection - fever or chills, cough, sore throat, pain or difficulty passing urine -signs of  decreased platelets or bleeding - bruising, pinpoint red spots on the skin, black, tarry stools, blood in the urine -signs of decreased red blood cells - unusually weak or tired, fainting spells, lightheadedness -breathing problems -changes in vision -chest pain -mouth sores -nausea and vomiting -pain, swelling, redness at site where injected -pain, tingling, numbness in the hands or feet -redness, swelling, or sores on hands or feet -stomach pain -unusual bleeding Side effects that usually do not require medical attention (report to your doctor or health care professional if they continue or are bothersome): -changes in finger or toe nails -diarrhea -dry or itchy skin -hair loss -headache -loss of appetite -sensitivity of eyes to the light -stomach upset -unusually teary eyes This list may not describe all possible side effects. Call your doctor for medical advice about side effects. You may report side effects to FDA at 1-800-FDA-1088. Where should I keep my medicine? This drug is given in a hospital or clinic and will not be stored at home. NOTE: This sheet is a summary. It may not cover all possible information. If you have questions about this medicine, talk to your doctor, pharmacist, or health care provider.  2018 Elsevier/Gold Standard (2007-09-04 13:53:16)

## 2016-07-14 ENCOUNTER — Telehealth: Payer: Self-pay | Admitting: Hematology

## 2016-07-14 ENCOUNTER — Telehealth: Payer: Self-pay

## 2016-07-14 NOTE — Telephone Encounter (Signed)
A little queasy, a little cold sensitivity. Is figuring out work arounds. Is eating protein drinks and boost. Encouraged water/fluid intake. He will call if needs any help or questions answered.

## 2016-07-14 NOTE — Progress Notes (Signed)
[facility]  Clinical Social Work was referred by need for CSW follow up for re-assessment of psychosocial needs and check in at first chemo.  Clinical Social Worker met with patient and wife at [facility] during first treatment. Pt and wife disclosed that "this is becoming more real" and are trying to "stay positive". CSW reviewed common emotions, coping techniques and support programs/services to assist. CSW provided safe space for pt and wife to share emotions and process situation. They report to have good support from friends. They are encouraged to attend groups and counseling as needed. They are aware to contact CSW as needed.    Clinical Social Work interventions:  Supportive listening and support Resource education and referral  Loren Racer, LCSW, OSW-C Clinical Social Worker [place]  [place] Phone: [phone] Fax: [phone]

## 2016-07-14 NOTE — Telephone Encounter (Signed)
Received FMLA paperwork for wife Tye [place] to be completed

## 2016-07-15 ENCOUNTER — Ambulatory Visit (HOSPITAL_BASED_OUTPATIENT_CLINIC_OR_DEPARTMENT_OTHER): Payer: Self-pay

## 2016-07-15 VITALS — BP 140/82 | HR 82 | Temp 97.8°F | Resp 17

## 2016-07-15 DIAGNOSIS — C189 Malignant neoplasm of colon, unspecified: Secondary | ICD-10-CM

## 2016-07-15 DIAGNOSIS — C787 Secondary malignant neoplasm of liver and intrahepatic bile duct: Secondary | ICD-10-CM

## 2016-07-15 MED ORDER — SODIUM CHLORIDE 0.9% FLUSH
10.0000 mL | INTRAVENOUS | Status: DC | PRN
  Administered 2016-07-15: 10 mL
  Filled 2016-07-15: qty 10

## 2016-07-15 MED ORDER — HEPARIN SOD (PORK) LOCK FLUSH 100 UNIT/ML IV SOLN
500.0000 [IU] | Freq: Once | INTRAVENOUS | Status: AC | PRN
  Administered 2016-07-15: 500 [IU]
  Filled 2016-07-15: qty 5

## 2016-07-19 ENCOUNTER — Telehealth: Payer: Self-pay | Admitting: Hematology

## 2016-07-19 NOTE — Telephone Encounter (Signed)
Faxed FMLA paperwork for wife Tye [place] to [facility] fax# [phone]

## 2016-07-21 NOTE — Progress Notes (Signed)
[name]  [phone] [phone]  Clinic Follow Up Note  Patient Care Team: Curlene Labrum, MD as PCP - General (Family Medicine) 07/27/2016  CHIEF COMPLAINTS:  Follow up right colon cancer and liver mass  Oncology History   Cancer Staging Metastatic colon cancer to liver [facility]) Staging form: Colon and Rectum, AJCC 8th Edition - Clinical stage from 06/01/2016: Stage IVA (cTX, cNX, pM1a) - Signed by Truitt Merle, MD on 07/04/2016 - Pathologic stage from 06/14/2016: Stage IVA (pT4b(m), pN2b, pM1a) - Signed by Truitt Merle, MD on 07/04/2016       Metastatic colon cancer to liver ([place])   04/2015 Procedure    Colonoscopy by Dr. Ladona Horns. It showed showed 2 sessile polyps ready between 3-5 mm in size located 20 cm (A, B) from the point of entry, polypectomy was performed. Pedunculated polyp was found in the ascending colon (C), polypectomy was performed, and additional polyp (D) was found 30 cm from the point of entry, removed      04/2015 Pathology Results    tubular adenoma (A and B), and well differentiated adenocarcinoma arising from tubulovillous adenoma (C) and well differentiated adenocarcinoma arising from severe dysplasia to intramucosal carcinoma within tubular adenoma.       04/2015 Initial Diagnosis    Metastatic colon cancer to liver ([place])      05/29/2016 Imaging    CT abdomen and pelvis with contrast showed an apple core like stricture in right colon just above the cecum, measuring 3.2 cm in lengths. This is highly suspicious for malignancy. Small lymph node a noticed he had adjacent mesentery, measuring 8 mm. There is a low-density lesion within the inferior aspect of the right hepatic lobe measuring 1.7 cm, suspicious for [place].       06/06/2016 Tumor Marker    CEA 9.99      06/08/2016 PET scan    IMPRESSION: Approximately 3 cm hypermetabolic mass in the ascending colon, consistent with primary colon carcinoma. This mass results in  colonic obstruction and small bowel dilatation. Additional areas of hypermetabolic wall thickening in the cecum may represent other sites of colon carcinoma or colitis. Mild hypermetabolic lymphadenopathy in right pericolonic region, porta hepatis, and aortocaval space, consistent with metastatic disease. Mild hypermetabolic mediastinal lymphadenopathy also seen, and thoracic lymph node metastases cannot be excluded. Solitary hypermetabolic focus in inferior right hepatic lobe, consistent with liver metastasis. Consider abdomen MRI without and with contrast for further evaluation.      06/14/2016 Surgery    Hand assisted right hemicolectomy and small bowel resection for colon cancer, liver biopsy, by Dr. Barry Dienes      06/14/2016 Pathology Results    Right hemicolectomy showed invasive well to moderately differentiated adenocarcinoma, 2 foci measuring 7.5 cm and 4.5 cm, tumor invades through full thickness of colon, to the seroma and involve the Small Bowel, Surgical Margins Were Negative, 24 Out Of 64 Lymph Nodes Were Positive, Extracapsular Extension Identified, Multiple Satellite Tumor Deposits Present, Liver Biopsy Showed Metastatic Adenocarcinoma.        06/14/2016 Miscellaneous    Tumor MMR normal, MSI stable        HISTORY OF PRESENTING ILLNESS:  Nicholas Lowery 56 y.o. male is here because of his recently abdominal CT which is highly suspicious for metastatic colon cancer. He is accompanied by his wife to my clinic today. He was referred by his primary care physician Dr. Pleas Koch.   He had colonoscopy in 2016 for mild rectal bleeding, he describe small  amount fresh blood mixed with stool, he has no other constitutional symptoms at that time. He was referred to gastroenterologist Dr. Ladona Horns and underwent a colonoscopy in December 2016. The colonoscopy showed 2 sessile polyps ready between 3-5 mm in size located 20 cm (A, B) from the point of entry, polypectomy was performed.  Pedunculated polyp was found in the ascending colon (C), polypectomy was performed, and additional polyp (D) was found 30 cm from the point of entry, removed. The pathology reviewed tubular adenoma (A and B), and well differentiated adenocarcinoma arising from tubulovillous adenoma (C) and well differentiated adenocarcinoma arising from severe dysplasia to intramucosal carcinoma within tubular adenoma. Dr. Tye [place] tried multiple times to reach patient, but patient sought they were calling him about the bill, and did not return the phone calls. He was not aware the cancer diagnosis until recently.   He started having diarrhea and vomiting in mid Dec 2017, and felt a "pop" in right side abdomen, he was seen at urgent care, and was treated with antiemetics, and IVF, lab tests were OK. Due to his persistent intermittent diarrhea and epigastric pain since then, he was seen by PCP and he eventually had CT abdomen and pelvis scan which showed a upper core lesion in the ascending colon, and I'll 1.7 cm lesion in the liver, highly suspicious for metastasis. He was referred to [place] for further evaluation.  He has lost 30 lbs in the past one month, has low appetite, eats a small meals 1-2 times a day. He has moderate fatigue, able to tolerate routine activities including his work, but feels exhausted at the end of study. He has occasional constipation, denies recent rectal bleeding.  CURRENT THERAPY: mFOLFOX, every 2 weeks, started on 07/14/2015   Narrative: The patient returns today for follow up and 2nd cycle chemotherapy. Patient reports being "fine" after chemo. He went back to work on Friday. He experienced tingling that lasted about a week in his fingers and throat/ He experienced fatigue but pushed through it. He experienced an itching sensation in the area around his report, caused by healing. Has at least 1 bowel movement a day. Denies nausea, but says he experiences "upset stomach." When he lays on one side of his  stomach he feels like his "biles move."  Experiences fatigue, but tries to maintain energy levels by doing daily activities. MFI syndrome negative.  Marland Kitchen  MEDICAL HISTORY:  Past Medical History:  Diagnosis Date  . Anxiety   . Cancer of ascending colon ([place])   . Seasonal allergies     SURGICAL HISTORY: Past Surgical History:  Procedure Laterality Date  . APPENDECTOMY  1992  . CATARACT EXTRACTION W/ INTRAOCULAR LENS IMPLANT Left 04/2016  . COLON SURGERY    . COLONOSCOPY W/ BIOPSIES AND POLYPECTOMY  04/2015  . INGUINAL HERNIA REPAIR Right 1982  . KNEE ARTHROSCOPY W/ MENISCECTOMY Right 1998  . LAPAROSCOPIC RIGHT COLECTOMY N/A 06/14/2016   Procedure: LAPAROSCOPIC HAND ASSISTED HEMICOLECTOMY AND SMALL BOWEL RESECTION.;  Surgeon: [name], MD;  Location: [facility];  Service: General;  Laterality: N/A;  . LIVER BIOPSY Right 06/14/2016   Procedure: LIVER BIOPSY;  Surgeon: [name], MD;  Location: [facility];  Service: General;  Laterality: Right;  Right Inferior Liver  . PORTACATH PLACEMENT N/A 07/06/2016   Procedure: INSERTION PORT-A-CATH;  Surgeon: [name], MD;  Location: [facility];  Service: General;  Laterality: N/A;  . VASECTOMY  2005    SOCIAL HISTORY: Social History   Social History  .  Marital status: Married    Spouse name: N/A  . Number of children: N/A  . Years of education: N/A   Occupational History  . Not on file.   Social History Main Topics  . Smoking status: Former Smoker    Years: 2.00    Types: Cigarettes    Quit date: 65  . Smokeless tobacco: Never Used  . Alcohol use 2.4 oz/week    4 Cans of beer per week     Comment: social, none since colon surgery  . Drug use: No     Comment: 06/15/2016 "nothing since college"  . Sexual activity: Yes   Other Topics Concern  . Not on file   Social History Narrative  . No narrative on file   He is married.  They have 3 boys, 28-12 yo. He works for a [employer], desk job.   FAMILY  HISTORY: Family History  Problem Relation Age of Onset  . Cancer Mother     lung cancer  . Stroke Mother   . Hypertension Father   . CAD Father   . Cancer Maternal Grandfather     prostate cancer     ALLERGIES:  is allergic to penicillins.  MEDICATIONS:  Current Outpatient Prescriptions  Medication Sig Dispense Refill  . celecoxib (CELEBREX) 200 MG capsule Take 200 mg by mouth daily.  1  . diphenoxylate-atropine (LOMOTIL) 2.5-0.025 MG tablet TAKE TWO TABLETS BY MOUTH FOUR TIMES DAILY AS NEEDED FOR FOR DIARRHEA OR loose stools  0  . docusate sodium (COLACE) 100 MG capsule Take 100 mg by mouth daily.    Marland Kitchen FLUoxetine (PROZAC) 40 MG capsule Take 40 mg by mouth daily.    . IRON PO Take 1 tablet by mouth daily.    Marland Kitchen lidocaine-prilocaine (EMLA) cream Apply to affected area once 30 g 3  . ondansetron (ZOFRAN ODT) 8 MG disintegrating tablet Take 1 tablet (8 mg total) by mouth every 8 (eight) hours as needed for nausea or vomiting. 30 tablet 2  . Probiotic Product (PROBIOTIC PO) Take 1 capsule by mouth daily.    . promethazine (PHENERGAN) 25 MG tablet Take 25 mg by mouth every 6 (six) hours as needed. for nausea  1  . traMADol (ULTRAM) 50 MG tablet Take 1-2 tablets (50-100 mg total) by mouth every 6 (six) hours as needed for moderate pain or severe pain. 30 tablet 0  . prochlorperazine (COMPAZINE) 10 MG tablet Take 1 tablet (10 mg total) by mouth every 6 (six) hours as needed for nausea or vomiting. 30 tablet 2   No current facility-administered medications for this visit.    Facility-Administered Medications Ordered in Other Visits  Medication Dose Route Frequency Provider Last Rate Last Dose  . clindamycin (CLEOCIN) 900 mg in dextrose 5 % 50 mL IVPB  900 mg Intravenous 60 min Pre-Op [name], MD       And  . gentamicin (GARAMYCIN) 450 mg in dextrose 5 % 50 mL IVPB  5 mg/kg Intravenous 60 min Pre-Op [name], MD        REVIEW OF SYSTEMS:   Constitutional: Denies fevers, chills  or abnormal night sweats (+) fatigue Eyes: Denies blurriness of vision, double vision or watery eyes Ears, nose, mouth, throat, and face: Denies mucositis or sore throat Respiratory: Denies cough, dyspnea or wheezes Cardiovascular: Denies palpitation, chest discomfort or lower extremity swelling Gastrointestinal:  Denies nausea, heartburn or change in bowel habits, Skin: Denies abnormal skin rashes Lymphatics: Denies new lymphadenopathy or easy  bruising Musculoskeletal: (+) mild pain to right shoulder Neurological:Denies numbness, tingling or new weaknesses Behavioral/Psych: Mood is stable, no new changes  All other systems were reviewed with the patient and are negative.  PHYSICAL EXAMINATION: ECOG PERFORMANCE STATUS: 1 - Symptomatic but completely ambulatory  Vitals:   07/27/16 0828  BP: (!) 142/75  Pulse: 75  Resp: 18  Temp: 98 F (36.7 C)   Filed Weights   07/27/16 0828  Weight: 201 lb 4.8 oz (91.3 kg)    GENERAL:alert, no distress and comfortable. SKIN: skin color, texture, turgor are normal, no rashes or significant lesions EYES: normal, conjunctiva are pink and non-injected, sclera clear OROPHARYNX:no exudate, no erythema and lips, buccal mucosa, and tongue normal  NECK: supple, thyroid normal size, non-tender, without nodularity LYMPH:  no palpable lymphadenopathy in the cervical, axillary or inguinal LUNGS: clear to auscultation and percussion with normal breathing effort HEART: regular rate & rhythm and no murmurs and no lower extremity edema ABDOMEN:abdomen soft, Surgical scar in the midline around the umbilical has healed well. non-tender and normal bowel sounds Musculoskeletal:no cyanosis of digits and no clubbing  PSYCH: alert & oriented x 3 with fluent speech NEURO: no focal motor/sensory deficits  LABORATORY DATA:  I have reviewed the data as listed CBC Latest Ref Rng & Units 07/13/2016 07/06/2016 06/18/2016  WBC 4.0 - 10.3 10e3/uL 3.9(L) 7.3 6.8  Hemoglobin  13.0 - 17.1 g/dL 11.3(L) 10.5(L) 8.6(L)  Hematocrit 38.4 - 49.9 % 34.7(L) 32.7(L) 26.2(L)  Platelets 140 - 400 10e3/uL 207 234 269   Surgical path   Diagnosis 06/14/2016 1. Colon, segmental resection for tumor, Right Ascending Hemicolectomy and Small Bowel - INVASIVE WELL TO MODERATELY DIFFERENTIATED ADENOCARCINOMA. - TWO TUMOR FOCI MEASURING 7.5 CM AND 4.5 CM IN GREATEST DIMENSION. - TUMOR INVADES THROUGH FULL THICKNESS OF COLON, THROUGH THE SEROSA TO INVOLVE THE SMALL BOWEL. - MARGINS ARE NEGATIVE. - TWENTY FOUR OF SIXTY FOUR LYMPH NODES POSITIVE FOR METASTATIC ADENOCARCINOMA (24/64). - EXTRACAPSULAR EXTENSION IDENTIFIED - MULTIPLE SATELLITE TUMOR DEPOSITS PRESENT. - SEE ONCOLOGY TEMPLATE. 2. Liver, needle/core biopsy, Right Inferior - POSITIVE FOR METASTATIC ADENOCARCINOMA.  RADIOGRAPHIC STUDIES: I have personally reviewed his outside CT scan from 05/29/2016 and agreed with the findings in the report. Dg Chest Port 1 View  Result Date: 07/06/2016 CLINICAL DATA:  Postop Port-A-Cath insertion EXAM: PORTABLE CHEST 1 VIEW COMPARISON:  None. FINDINGS: Power port has been placed from a left subclavian approach. Tip is in the SVC 1 cm above the right atrium. No pneumothorax. Mild post procedural basilar atelectasis. Upper lungs are clear. No effusions. IMPRESSION: Power poor well positioned with the tip 1 cm above the right atrium. Mild post procedural basilar atelectasis. Electronically Signed   By: Nelson Chimes M.D.   On: 07/06/2016 10:25   Dg Fluoro Guide Cv Line-no Report  Result Date: 07/06/2016 Fluoroscopy was utilized by the requesting physician.  No radiographic interpretation.    ASSESSMENT & PLAN:  56 y.o. Caucasian male, without significant past medical history, presented with rectal bleeding in December 2016, colonoscopy showed 4 polyps, 2 of them showed invasive adenocarcinoma, unfortunately he was not aware of the pathology findings and was not treated. He now presented with  fatigue, weight loss, anorexia and abdominal pain  1. Right colon cancer with liver and node metastasis, pT4bN2bM1a, stage IV, MSI-stable  -I previously reviewed her PET scan findings, which showed solitary liver metastasis, abdominal and possible thoracic node metastasis  -His liver biopsy confirmed metastasis -I previously reviewed his surgical pathology findings,  which showed 2 primary right colon cancer, very locally advanced with 24 lymph nodes positive, surgical margins were negative. -We reviewed the patient's CEA from 06/06/16 which was 9.99. -I previously recommend him to start systemic chemotherapy, I discussed the option of FOLFOX, CAPOX, and FOLFIRI. I recommend genomic testing Foundation one, to see if he is a candidate for EGFR inhibitor. If not, I would add Avastin to his chemotherapy regimen. His tumor has MSI stable, unlikely would benefit from immunotherapy alone.  -The patient has started first-line chemotherapy FOLFOX, tolerated this cycle very well. We'll continue. His Foundation One results still pending. -The goal for chemotherapy is palliative, giving his metastatic disease. -We previously discussed the possible local therapy options, such as liver metastectomy or ablation if he has great response to chemo and his node metastasis resolves  - Repeat CT scan after 5-6 cycle of chemo -Compazine for nausea refil today with 2 refills.  2. Anemia of iron deficiency  -He has mild anemia, likely related to his colon cancer bleeding. -His HGB was low following surgery. I will order a repeat lab. -I previously recommend him to take oral iron supplement over-the-counter, potential side effects of constipation and gastric or discomfort or discussed with him. He stopped taking this before surgery. -I previously advised him to begin taking oral iron supplements again. - Getting better ,11.3 today - We will continue to monitor iron levels   3. Anorexia and weight loss -Secondary to  underlying malignancy -I encouraged him to try nutritional supplement, such as boost or initial -The patient has gained some weight recently.  4. Abdominal pain -mild, he takes celecobix as needed. -The patient previously stopped taking this medication for surgery, but has since restarted.  5. Goal of care discussion  -We previously discussed the incurable nature of his cancer, and the overall poor prognosis, especially if he does not have good response to chemotherapy or progress on chemo -The patient understands the goal of care is palliative.   6. Nutrient  - We discussed that steroids could induce diabetes - Patient concerned about the glucose intake from eating citric fruits. We discussed it is okay to eat them because he his not diabetic.  - I encouraged him to drink more water   Plan  -Lab reviewed, adequate for treatment, we'll proceed to cycle 2 FOLFOX today. - come back in 2 weeks for chemo ,I will schedule for 2 cycles, we'll likely add Avastin or Panitumumab from next cycle chemotherapy. - I will refill Compazin for nausea refil today with 2 refills.  No orders of the defined types were placed in this encounter.   All questions were answered. The patient knows to call the clinic with any problems, questions or concerns. I spent 25 minutes counseling the patient face to face. The total time spent in the appointment was 30 minutes and more than 50% was on counseling.  This document serves as a record of services personally performed by Truitt Merle, MD. It was created on her behalf by Maryla Morrow, a trained medical scribe. The creation of this record is based on the scribe's personal observations and the provider's statements to them. This document has been checked and approved by the attending provider.    Truitt Merle, MD 07/27/2016

## 2016-07-27 ENCOUNTER — Encounter: Payer: Self-pay | Admitting: Hematology

## 2016-07-27 ENCOUNTER — Other Ambulatory Visit (HOSPITAL_BASED_OUTPATIENT_CLINIC_OR_DEPARTMENT_OTHER): Payer: BLUE CROSS/BLUE SHIELD

## 2016-07-27 ENCOUNTER — Ambulatory Visit (HOSPITAL_BASED_OUTPATIENT_CLINIC_OR_DEPARTMENT_OTHER): Payer: BLUE CROSS/BLUE SHIELD | Admitting: Hematology

## 2016-07-27 ENCOUNTER — Ambulatory Visit: Payer: BLUE CROSS/BLUE SHIELD | Admitting: Nutrition

## 2016-07-27 ENCOUNTER — Ambulatory Visit (HOSPITAL_BASED_OUTPATIENT_CLINIC_OR_DEPARTMENT_OTHER): Payer: BLUE CROSS/BLUE SHIELD

## 2016-07-27 VITALS — BP 142/75 | HR 75 | Temp 98.0°F | Resp 18 | Ht 71.0 in | Wt 201.3 lb

## 2016-07-27 DIAGNOSIS — C787 Secondary malignant neoplasm of liver and intrahepatic bile duct: Secondary | ICD-10-CM

## 2016-07-27 DIAGNOSIS — D509 Iron deficiency anemia, unspecified: Secondary | ICD-10-CM | POA: Diagnosis not present

## 2016-07-27 DIAGNOSIS — R634 Abnormal weight loss: Secondary | ICD-10-CM

## 2016-07-27 DIAGNOSIS — R630 Anorexia: Secondary | ICD-10-CM | POA: Diagnosis not present

## 2016-07-27 DIAGNOSIS — Z5111 Encounter for antineoplastic chemotherapy: Secondary | ICD-10-CM

## 2016-07-27 DIAGNOSIS — C182 Malignant neoplasm of ascending colon: Secondary | ICD-10-CM

## 2016-07-27 DIAGNOSIS — C189 Malignant neoplasm of colon, unspecified: Secondary | ICD-10-CM

## 2016-07-27 DIAGNOSIS — R109 Unspecified abdominal pain: Secondary | ICD-10-CM | POA: Diagnosis not present

## 2016-07-27 DIAGNOSIS — D500 Iron deficiency anemia secondary to blood loss (chronic): Secondary | ICD-10-CM

## 2016-07-27 LAB — COMPREHENSIVE METABOLIC PANEL
ALBUMIN: 3.8 g/dL (ref 3.5–5.0)
ALT: 36 U/L (ref 0–55)
AST: 24 U/L (ref 5–34)
Alkaline Phosphatase: 54 U/L (ref 40–150)
Anion Gap: 8 mEq/L (ref 3–11)
BUN: 19.4 mg/dL (ref 7.0–26.0)
CO2: 25 meq/L (ref 22–29)
Calcium: 9.1 mg/dL (ref 8.4–10.4)
Chloride: 107 mEq/L (ref 98–109)
Creatinine: 0.9 mg/dL (ref 0.7–1.3)
EGFR: >90 mL/min/{1.73_m2} (ref >90)
GLUCOSE: 88 mg/dL (ref 70–140)
POTASSIUM: 4.3 meq/L (ref 3.5–5.1)
SODIUM: 140 meq/L (ref 136–145)
Total Bilirubin: 0.34 mg/dL (ref 0.20–1.20)
Total Protein: 6.6 g/dL (ref 6.4–8.3)

## 2016-07-27 LAB — CBC WITH DIFFERENTIAL/PLATELET
BASO%: 1.1 % (ref 0.0–2.0)
BASOS ABS: 0 10*3/uL (ref 0.0–0.1)
EOS ABS: 0.1 10*3/uL (ref 0.0–0.5)
EOS%: 2.6 % (ref 0.0–7.0)
HEMATOCRIT: 32 % — AB (ref 38.4–49.9)
HGB: 10.9 g/dL — ABNORMAL LOW (ref 13.0–17.1)
LYMPH%: 18.1 % (ref 14.0–49.0)
MCH: 29.8 pg (ref 27.2–33.4)
MCHC: 34 g/dL (ref 32.0–36.0)
MCV: 87.8 fL (ref 79.3–98.0)
MONO#: 0.5 10*3/uL (ref 0.1–0.9)
MONO%: 13.7 % (ref 0.0–14.0)
NEUT#: 2.4 10*3/uL (ref 1.5–6.5)
NEUT%: 64.5 % (ref 39.0–75.0)
Platelets: 170 10*3/uL (ref 140–400)
RBC: 3.65 10*6/uL — ABNORMAL LOW (ref 4.20–5.82)
RDW: 15.4 % — AB (ref 11.0–14.6)
WBC: 3.8 10*3/uL — AB (ref 4.0–10.3)
lymph#: 0.7 10*3/uL — ABNORMAL LOW (ref 0.9–3.3)

## 2016-07-27 LAB — CEA (IN HOUSE-CHCC): CEA (CHCC-IN HOUSE): 15.85 ng/mL — AB (ref 0.00–5.00)

## 2016-07-27 LAB — IRON AND TIBC
%SAT: 17 % — AB (ref 20–55)
Iron: 58 ug/dL (ref 42–163)
TIBC: 344 ug/dL (ref 202–409)
UIBC: 286 ug/dL (ref 117–376)

## 2016-07-27 LAB — FERRITIN: Ferritin: 54 ng/ml (ref 22–316)

## 2016-07-27 MED ORDER — DEXAMETHASONE SODIUM PHOSPHATE 10 MG/ML IJ SOLN
10.0000 mg | Freq: Once | INTRAMUSCULAR | Status: AC
  Administered 2016-07-27: 10 mg via INTRAVENOUS

## 2016-07-27 MED ORDER — PROCHLORPERAZINE MALEATE 10 MG PO TABS: 10.0000 mg | ORAL_TABLET | Freq: Four times a day (QID) | ORAL | 2 refills | Status: DC | PRN

## 2016-07-27 MED ORDER — DEXTROSE 5 % IV SOLN
400.0000 mg/m2 | Freq: Once | INTRAVENOUS | Status: AC
  Administered 2016-07-27: 840 mg via INTRAVENOUS
  Filled 2016-07-27: qty 42

## 2016-07-27 MED ORDER — SODIUM CHLORIDE 0.9 % IV SOLN
2375.0000 mg/m2 | INTRAVENOUS | Status: DC
  Administered 2016-07-27: 5000 mg via INTRAVENOUS
  Filled 2016-07-27: qty 100

## 2016-07-27 MED ORDER — PALONOSETRON HCL INJECTION 0.25 MG/5ML
0.2500 mg | Freq: Once | INTRAVENOUS | Status: AC
  Administered 2016-07-27: 0.25 mg via INTRAVENOUS

## 2016-07-27 MED ORDER — FLUOROURACIL CHEMO INJECTION 2.5 GM/50ML
400.0000 mg/m2 | Freq: Once | INTRAVENOUS | Status: AC
  Administered 2016-07-27: 850 mg via INTRAVENOUS
  Filled 2016-07-27: qty 17

## 2016-07-27 MED ORDER — DEXTROSE 5 % IV SOLN
Freq: Once | INTRAVENOUS | Status: AC
  Administered 2016-07-27: 11:00:00 via INTRAVENOUS

## 2016-07-27 MED ORDER — PALONOSETRON HCL INJECTION 0.25 MG/5ML
INTRAVENOUS | Status: AC
  Filled 2016-07-27: qty 5

## 2016-07-27 MED ORDER — DEXAMETHASONE SODIUM PHOSPHATE 10 MG/ML IJ SOLN
INTRAMUSCULAR | Status: AC
  Filled 2016-07-27: qty 1

## 2016-07-27 MED ORDER — DEXTROSE 5 % IV SOLN
85.0000 mg/m2 | Freq: Once | INTRAVENOUS | Status: AC
  Administered 2016-07-27: 180 mg via INTRAVENOUS
  Filled 2016-07-27: qty 36

## 2016-07-27 NOTE — Progress Notes (Signed)
Nutrition follow-up completed with patient diagnosed with metastatic colon cancer.  Weight improved documented as 201.3 pounds on March 15 increased from 187 pounds February 12. Noted BUN 27.4. Patient is eating small amounts of food more often.  Nutrition diagnosis:  Food and nutrition related knowledge deficit improved.  Intervention: Patient was educated to continue small frequent meals and snacks to promote weight maintenance. Encouraged healthy plant-based diet, higher protein foods. Encouraged patient to continue oral nutrition supplements such as Carnation breakfast 1-2 daily between meals. Answered questions.  Teach back method used.  Monitoring, evaluation, goals:  Patient will work to increase calories and protein to promote weight maintenance.  Next visit: To be scheduled as needed with treatment.  **Disclaimer: This note was dictated with voice recognition software. Similar sounding words can inadvertently be transcribed and this note may contain transcription errors which may not have been corrected upon publication of note.**

## 2016-07-27 NOTE — Patient Instructions (Signed)
[facility] Discharge Instructions for Patients Receiving Chemotherapy  Today you received the following chemotherapy agents:  Oxaliplatin (paraplatin), Leucovorin, Fluorouracil (adrucil)  To help prevent nausea and vomiting after your treatment, we encourage you to take your nausea medication as prescribed.   If you develop nausea and vomiting that is not controlled by your nausea medication, call the clinic.   BELOW ARE SYMPTOMS THAT SHOULD BE REPORTED IMMEDIATELY:  *FEVER GREATER THAN 100.5 F  *CHILLS WITH OR WITHOUT FEVER  NAUSEA AND VOMITING THAT IS NOT CONTROLLED WITH YOUR NAUSEA MEDICATION  *UNUSUAL SHORTNESS OF BREATH  *UNUSUAL BRUISING OR BLEEDING  TENDERNESS IN MOUTH AND THROAT WITH OR WITHOUT PRESENCE OF ULCERS  *URINARY PROBLEMS  *BOWEL PROBLEMS  UNUSUAL RASH Items with * indicate a potential emergency and should be followed up as soon as possible.  Feel free to call the clinic you have any questions or concerns. The clinic phone number is [phone].  Please show the [facility] at check-in to the Emergency Department and triage nurse.

## 2016-07-29 ENCOUNTER — Ambulatory Visit (HOSPITAL_BASED_OUTPATIENT_CLINIC_OR_DEPARTMENT_OTHER): Payer: BLUE CROSS/BLUE SHIELD

## 2016-07-29 ENCOUNTER — Telehealth: Payer: Self-pay | Admitting: Hematology

## 2016-07-29 VITALS — BP 136/93 | HR 81 | Temp 98.7°F | Resp 16

## 2016-07-29 DIAGNOSIS — Z452 Encounter for adjustment and management of vascular access device: Secondary | ICD-10-CM | POA: Diagnosis not present

## 2016-07-29 DIAGNOSIS — C787 Secondary malignant neoplasm of liver and intrahepatic bile duct: Principal | ICD-10-CM

## 2016-07-29 DIAGNOSIS — C189 Malignant neoplasm of colon, unspecified: Secondary | ICD-10-CM | POA: Diagnosis not present

## 2016-07-29 MED ORDER — HEPARIN SOD (PORK) LOCK FLUSH 100 UNIT/ML IV SOLN
500.0000 [IU] | Freq: Once | INTRAVENOUS | Status: AC | PRN
  Administered 2016-07-29: 500 [IU]
  Filled 2016-07-29: qty 5

## 2016-07-29 MED ORDER — SODIUM CHLORIDE 0.9% FLUSH
10.0000 mL | INTRAVENOUS | Status: DC | PRN
  Administered 2016-07-29: 10 mL
  Filled 2016-07-29: qty 10

## 2016-07-29 NOTE — Telephone Encounter (Signed)
Left message for patient re appointments for 3/29 and 3/31, 4/12 and 4/14.

## 2016-07-29 NOTE — Patient Instructions (Signed)
Fluorouracil, 5-FU injection What is this medicine? FLUOROURACIL, 5-FU (flure oh YOOR a sil) is a chemotherapy drug. It slows the growth of cancer cells. This medicine is used to treat many types of cancer like breast cancer, colon or rectal cancer, pancreatic cancer, and stomach cancer. This medicine may be used for other purposes; ask your health care provider or pharmacist if you have questions. COMMON BRAND NAME(S): Adrucil What should I tell my health care provider before I take this medicine? They need to know if you have any of these conditions: -blood disorders -dihydropyrimidine dehydrogenase (DPD) deficiency -infection (especially a virus infection such as chickenpox, cold sores, or herpes) -kidney disease -liver disease -malnourished, poor nutrition -recent or ongoing radiation therapy -an unusual or allergic reaction to fluorouracil, other chemotherapy, other medicines, foods, dyes, or preservatives -pregnant or trying to get pregnant -breast-feeding How should I use this medicine? This drug is given as an infusion or injection into a vein. It is administered in a hospital or clinic by a specially trained health care professional. Talk to your pediatrician regarding the use of this medicine in children. Special care may be needed. Overdosage: If you think you have taken too much of this medicine contact a poison control center or emergency room at once. NOTE: This medicine is only for you. Do not share this medicine with others. What if I miss a dose? It is important not to miss your dose. Call your doctor or health care professional if you are unable to keep an appointment. What may interact with this medicine? -allopurinol -cimetidine -dapsone -digoxin -hydroxyurea -leucovorin -levamisole -medicines for seizures like ethotoin, fosphenytoin, phenytoin -medicines to increase blood counts like filgrastim, pegfilgrastim, sargramostim -medicines that treat or prevent blood  clots like warfarin, enoxaparin, and dalteparin -methotrexate -metronidazole -pyrimethamine -some other chemotherapy drugs like busulfan, cisplatin, estramustine, vinblastine -trimethoprim -trimetrexate -vaccines Talk to your doctor or health care professional before taking any of these medicines: -acetaminophen -aspirin -ibuprofen -ketoprofen -naproxen This list may not describe all possible interactions. Give your health care provider a list of all the medicines, herbs, non-prescription drugs, or dietary supplements you use. Also tell them if you smoke, drink alcohol, or use illegal drugs. Some items may interact with your medicine. What should I watch for while using this medicine? Visit your doctor for checks on your progress. This drug may make you feel generally unwell. This is not uncommon, as chemotherapy can affect healthy cells as well as cancer cells. Report any side effects. Continue your course of treatment even though you feel ill unless your doctor tells you to stop. In some cases, you may be given additional medicines to help with side effects. Follow all directions for their use. Call your doctor or health care professional for advice if you get a fever, chills or sore throat, or other symptoms of a cold or flu. Do not treat yourself. This drug decreases your body's ability to fight infections. Try to avoid being around people who are sick. This medicine may increase your risk to bruise or bleed. Call your doctor or health care professional if you notice any unusual bleeding. Be careful brushing and flossing your teeth or using a toothpick because you may get an infection or bleed more easily. If you have any dental work done, tell your dentist you are receiving this medicine. Avoid taking products that contain aspirin, acetaminophen, ibuprofen, naproxen, or ketoprofen unless instructed by your doctor. These medicines may hide a fever. Do not become pregnant while taking this    medicine. Women should inform their doctor if they wish to become pregnant or think they might be pregnant. There is a potential for serious side effects to an unborn child. Talk to your health care professional or pharmacist for more information. Do not breast-feed an infant while taking this medicine. Men should inform their doctor if they wish to father a child. This medicine may lower sperm counts. Do not treat diarrhea with over the counter products. Contact your doctor if you have diarrhea that lasts more than 2 days or if it is severe and watery. This medicine can make you more sensitive to the sun. Keep out of the sun. If you cannot avoid being in the sun, wear protective clothing and use sunscreen. Do not use sun lamps or tanning beds/booths. What side effects may I notice from receiving this medicine? Side effects that you should report to your doctor or health care professional as soon as possible: -allergic reactions like skin rash, itching or hives, swelling of the face, lips, or tongue -low blood counts - this medicine may decrease the number of white blood cells, red blood cells and platelets. You may be at increased risk for infections and bleeding. -signs of infection - fever or chills, cough, sore throat, pain or difficulty passing urine -signs of decreased platelets or bleeding - bruising, pinpoint red spots on the skin, black, tarry stools, blood in the urine -signs of decreased red blood cells - unusually weak or tired, fainting spells, lightheadedness -breathing problems -changes in vision -chest pain -mouth sores -nausea and vomiting -pain, swelling, redness at site where injected -pain, tingling, numbness in the hands or feet -redness, swelling, or sores on hands or feet -stomach pain -unusual bleeding Side effects that usually do not require medical attention (report to your doctor or health care professional if they continue or are bothersome): -changes in finger or  toe nails -diarrhea -dry or itchy skin -hair loss -headache -loss of appetite -sensitivity of eyes to the light -stomach upset -unusually teary eyes This list may not describe all possible side effects. Call your doctor for medical advice about side effects. You may report side effects to FDA at 1-800-FDA-1088. Where should I keep my medicine? This drug is given in a hospital or clinic and will not be stored at home. NOTE: This sheet is a summary. It may not cover all possible information. If you have questions about this medicine, talk to your doctor, pharmacist, or health care provider.  2018 Elsevier/Gold Standard (2007-09-04 13:53:16)  

## 2016-08-01 NOTE — Telephone Encounter (Signed)
Schedule mailed.  °

## 2016-08-03 ENCOUNTER — Telehealth: Payer: Self-pay | Admitting: Hematology

## 2016-08-03 ENCOUNTER — Encounter: Payer: Self-pay | Admitting: Hematology

## 2016-08-03 ENCOUNTER — Encounter: Payer: Self-pay | Admitting: Nurse Practitioner

## 2016-08-03 NOTE — Telephone Encounter (Signed)
Patient called to reschedule appointment times for 4.12.18 appointments. Prefers early am appointments.

## 2016-08-08 ENCOUNTER — Other Ambulatory Visit: Payer: Self-pay

## 2016-08-08 ENCOUNTER — Other Ambulatory Visit: Payer: Self-pay | Admitting: Hematology

## 2016-08-08 DIAGNOSIS — C189 Malignant neoplasm of colon, unspecified: Secondary | ICD-10-CM

## 2016-08-08 DIAGNOSIS — C787 Secondary malignant neoplasm of liver and intrahepatic bile duct: Principal | ICD-10-CM

## 2016-08-10 ENCOUNTER — Ambulatory Visit (HOSPITAL_BASED_OUTPATIENT_CLINIC_OR_DEPARTMENT_OTHER): Payer: BLUE CROSS/BLUE SHIELD

## 2016-08-10 ENCOUNTER — Ambulatory Visit (HOSPITAL_BASED_OUTPATIENT_CLINIC_OR_DEPARTMENT_OTHER): Payer: BLUE CROSS/BLUE SHIELD | Admitting: Nurse Practitioner

## 2016-08-10 ENCOUNTER — Other Ambulatory Visit (HOSPITAL_BASED_OUTPATIENT_CLINIC_OR_DEPARTMENT_OTHER): Payer: BLUE CROSS/BLUE SHIELD

## 2016-08-10 VITALS — BP 170/100 | HR 64 | Temp 98.0°F | Wt 195.4 lb

## 2016-08-10 VITALS — BP 144/98

## 2016-08-10 DIAGNOSIS — C189 Malignant neoplasm of colon, unspecified: Secondary | ICD-10-CM

## 2016-08-10 DIAGNOSIS — C182 Malignant neoplasm of ascending colon: Secondary | ICD-10-CM

## 2016-08-10 DIAGNOSIS — C787 Secondary malignant neoplasm of liver and intrahepatic bile duct: Secondary | ICD-10-CM

## 2016-08-10 DIAGNOSIS — Z5112 Encounter for antineoplastic immunotherapy: Secondary | ICD-10-CM | POA: Diagnosis not present

## 2016-08-10 DIAGNOSIS — Z5111 Encounter for antineoplastic chemotherapy: Secondary | ICD-10-CM

## 2016-08-10 DIAGNOSIS — Z452 Encounter for adjustment and management of vascular access device: Secondary | ICD-10-CM

## 2016-08-10 DIAGNOSIS — Z95828 Presence of other vascular implants and grafts: Secondary | ICD-10-CM | POA: Insufficient documentation

## 2016-08-10 LAB — CBC WITH DIFFERENTIAL/PLATELET
BASO%: 1.3 % (ref 0.0–2.0)
BASOS ABS: 0 10*3/uL (ref 0.0–0.1)
EOS%: 1.4 % (ref 0.0–7.0)
Eosinophils Absolute: 0 10*3/uL (ref 0.0–0.5)
HEMATOCRIT: 33.5 % — AB (ref 38.4–49.9)
HEMOGLOBIN: 11.2 g/dL — AB (ref 13.0–17.1)
LYMPH#: 0.7 10*3/uL — AB (ref 0.9–3.3)
LYMPH%: 22.1 % (ref 14.0–49.0)
MCH: 29.3 pg (ref 27.2–33.4)
MCHC: 33.5 g/dL (ref 32.0–36.0)
MCV: 87.6 fL (ref 79.3–98.0)
MONO#: 0.6 10*3/uL (ref 0.1–0.9)
MONO%: 18.7 % — AB (ref 0.0–14.0)
NEUT%: 56.5 % (ref 39.0–75.0)
NEUTROS ABS: 1.8 10*3/uL (ref 1.5–6.5)
Platelets: 146 10*3/uL (ref 140–400)
RBC: 3.83 10*6/uL — ABNORMAL LOW (ref 4.20–5.82)
RDW: 16 % — ABNORMAL HIGH (ref 11.0–14.6)
WBC: 3.1 10*3/uL — AB (ref 4.0–10.3)

## 2016-08-10 LAB — COMPREHENSIVE METABOLIC PANEL
ALBUMIN: 4 g/dL (ref 3.5–5.0)
ALK PHOS: 54 U/L (ref 40–150)
ALT: 32 U/L (ref 0–55)
AST: 25 U/L (ref 5–34)
Anion Gap: 6 mEq/L (ref 3–11)
BUN: 16.9 mg/dL (ref 7.0–26.0)
CALCIUM: 9.3 mg/dL (ref 8.4–10.4)
CO2: 25 mEq/L (ref 22–29)
CREATININE: 1 mg/dL (ref 0.7–1.3)
Chloride: 107 mEq/L (ref 98–109)
EGFR: 90 mL/min/{1.73_m2} — ABNORMAL LOW (ref >90)
GLUCOSE: 87 mg/dL (ref 70–140)
POTASSIUM: 4.1 meq/L (ref 3.5–5.1)
Sodium: 139 mEq/L (ref 136–145)
Total Bilirubin: 0.35 mg/dL (ref 0.20–1.20)
Total Protein: 6.8 g/dL (ref 6.4–8.3)

## 2016-08-10 LAB — UA PROTEIN, DIPSTICK - CHCC: Protein, ur: NEGATIVE mg/dL

## 2016-08-10 MED ORDER — PALONOSETRON HCL INJECTION 0.25 MG/5ML
INTRAVENOUS | Status: AC
  Filled 2016-08-10: qty 5

## 2016-08-10 MED ORDER — OXALIPLATIN CHEMO INJECTION 100 MG/20ML
85.0000 mg/m2 | Freq: Once | INTRAVENOUS | Status: AC
  Administered 2016-08-10: 180 mg via INTRAVENOUS
  Filled 2016-08-10: qty 36

## 2016-08-10 MED ORDER — HEPARIN SOD (PORK) LOCK FLUSH 100 UNIT/ML IV SOLN
500.0000 [IU] | Freq: Once | INTRAVENOUS | Status: DC | PRN
Start: 2016-08-10 — End: 2016-08-10
  Filled 2016-08-10: qty 5

## 2016-08-10 MED ORDER — PALONOSETRON HCL INJECTION 0.25 MG/5ML
0.2500 mg | Freq: Once | INTRAVENOUS | Status: AC
  Administered 2016-08-10: 0.25 mg via INTRAVENOUS

## 2016-08-10 MED ORDER — SODIUM CHLORIDE 0.9% FLUSH
10.0000 mL | Freq: Once | INTRAVENOUS | Status: AC
  Administered 2016-08-10: 10 mL
  Filled 2016-08-10: qty 10

## 2016-08-10 MED ORDER — DEXAMETHASONE SODIUM PHOSPHATE 10 MG/ML IJ SOLN
INTRAMUSCULAR | Status: AC
Start: 2016-08-10 — End: 2016-08-10
  Filled 2016-08-10: qty 1

## 2016-08-10 MED ORDER — SODIUM CHLORIDE 0.9% FLUSH
10.0000 mL | INTRAVENOUS | Status: DC | PRN
  Filled 2016-08-10: qty 10

## 2016-08-10 MED ORDER — LEUCOVORIN CALCIUM INJECTION 350 MG
400.0000 mg/m2 | Freq: Once | INTRAVENOUS | Status: AC
  Administered 2016-08-10: 840 mg via INTRAVENOUS
  Filled 2016-08-10: qty 42

## 2016-08-10 MED ORDER — DEXTROSE 5 % IV SOLN
Freq: Once | INTRAVENOUS | Status: AC
  Administered 2016-08-10: 14:00:00 via INTRAVENOUS

## 2016-08-10 MED ORDER — SODIUM CHLORIDE 0.9 % IV SOLN
2390.0000 mg/m2 | INTRAVENOUS | Status: DC
  Administered 2016-08-10: 5000 mg via INTRAVENOUS
  Filled 2016-08-10: qty 100

## 2016-08-10 MED ORDER — BEVACIZUMAB CHEMO INJECTION 400 MG/16ML
5.0000 mg/kg | Freq: Once | INTRAVENOUS | Status: AC
  Administered 2016-08-10: 450 mg via INTRAVENOUS
  Filled 2016-08-10: qty 16

## 2016-08-10 MED ORDER — DEXAMETHASONE SODIUM PHOSPHATE 10 MG/ML IJ SOLN
10.0000 mg | Freq: Once | INTRAMUSCULAR | Status: AC
  Administered 2016-08-10: 10 mg via INTRAVENOUS

## 2016-08-10 MED ORDER — FLUOROURACIL CHEMO INJECTION 2.5 GM/50ML
400.0000 mg/m2 | Freq: Once | INTRAVENOUS | Status: AC
  Administered 2016-08-10: 850 mg via INTRAVENOUS
  Filled 2016-08-10: qty 17
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
## 2016-08-10 NOTE — Progress Notes (Signed)
Dr. Lindi Adie called and OK given to run Adrucil pump over 44 hours. Patient to come in on Saturday at 1 pm for pump D/C.

## 2016-08-10 NOTE — Progress Notes (Signed)
Per [name], ok to treat with BP of 144/98. She will call him in some amlodipine. Patient has been instructed to take his blood pressure regularly.

## 2016-08-10 NOTE — Patient Instructions (Signed)
[facility] Discharge Instructions for Patients Receiving Chemotherapy  Today you received the following chemotherapy agents Avastin/Leuco/Oxaliplatin/Adrucil  To help prevent nausea and vomiting after your treatment, we encourage you to take your nausea medication   If you develop nausea and vomiting that is not controlled by your nausea medication, call the clinic.   BELOW ARE SYMPTOMS THAT SHOULD BE REPORTED IMMEDIATELY:  *FEVER GREATER THAN 100.5 F  *CHILLS WITH OR WITHOUT FEVER  NAUSEA AND VOMITING THAT IS NOT CONTROLLED WITH YOUR NAUSEA MEDICATION  *UNUSUAL SHORTNESS OF BREATH  *UNUSUAL BRUISING OR BLEEDING  TENDERNESS IN MOUTH AND THROAT WITH OR WITHOUT PRESENCE OF ULCERS  *URINARY PROBLEMS  *BOWEL PROBLEMS  UNUSUAL RASH Items with * indicate a potential emergency and should be followed up as soon as possible.  Feel free to call the clinic you have any questions or concerns. The clinic phone number is [phone].  Please show the [facility] at check-in to the Emergency Department and triage nurse.

## 2016-08-11 ENCOUNTER — Encounter (HOSPITAL_COMMUNITY): Payer: Self-pay

## 2016-08-12 ENCOUNTER — Ambulatory Visit (HOSPITAL_BASED_OUTPATIENT_CLINIC_OR_DEPARTMENT_OTHER): Payer: BLUE CROSS/BLUE SHIELD

## 2016-08-12 VITALS — BP 128/94 | HR 86 | Temp 98.0°F | Resp 18

## 2016-08-12 DIAGNOSIS — C189 Malignant neoplasm of colon, unspecified: Secondary | ICD-10-CM

## 2016-08-12 DIAGNOSIS — C787 Secondary malignant neoplasm of liver and intrahepatic bile duct: Principal | ICD-10-CM

## 2016-08-12 MED ORDER — SODIUM CHLORIDE 0.9% FLUSH
10.0000 mL | INTRAVENOUS | Status: DC | PRN
  Administered 2016-08-12: 10 mL
  Filled 2016-08-12: qty 10

## 2016-08-12 MED ORDER — HEPARIN SOD (PORK) LOCK FLUSH 100 UNIT/ML IV SOLN
500.0000 [IU] | Freq: Once | INTRAVENOUS | Status: AC | PRN
  Administered 2016-08-12: 500 [IU]
  Filled 2016-08-12: qty 5
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
## 2016-08-21 ENCOUNTER — Encounter: Payer: Self-pay | Admitting: Hematology

## 2016-08-21 ENCOUNTER — Other Ambulatory Visit: Payer: Self-pay | Admitting: Hematology

## 2016-08-21 DIAGNOSIS — C189 Malignant neoplasm of colon, unspecified: Secondary | ICD-10-CM

## 2016-08-21 DIAGNOSIS — C787 Secondary malignant neoplasm of liver and intrahepatic bile duct: Principal | ICD-10-CM

## 2016-08-24 ENCOUNTER — Ambulatory Visit (HOSPITAL_BASED_OUTPATIENT_CLINIC_OR_DEPARTMENT_OTHER): Payer: BLUE CROSS/BLUE SHIELD

## 2016-08-24 ENCOUNTER — Other Ambulatory Visit: Payer: BLUE CROSS/BLUE SHIELD

## 2016-08-24 ENCOUNTER — Ambulatory Visit: Payer: BLUE CROSS/BLUE SHIELD

## 2016-08-24 ENCOUNTER — Telehealth: Payer: Self-pay | Admitting: Hematology

## 2016-08-24 ENCOUNTER — Encounter: Payer: Self-pay | Admitting: Hematology

## 2016-08-24 ENCOUNTER — Ambulatory Visit: Payer: BLUE CROSS/BLUE SHIELD | Admitting: Hematology

## 2016-08-24 ENCOUNTER — Ambulatory Visit (HOSPITAL_BASED_OUTPATIENT_CLINIC_OR_DEPARTMENT_OTHER): Payer: BLUE CROSS/BLUE SHIELD | Admitting: Hematology

## 2016-08-24 VITALS — BP 174/117

## 2016-08-24 VITALS — BP 158/98 | HR 75 | Temp 97.6°F | Resp 18 | Ht 71.0 in | Wt 195.2 lb

## 2016-08-24 DIAGNOSIS — I10 Essential (primary) hypertension: Secondary | ICD-10-CM | POA: Diagnosis not present

## 2016-08-24 DIAGNOSIS — D500 Iron deficiency anemia secondary to blood loss (chronic): Secondary | ICD-10-CM

## 2016-08-24 DIAGNOSIS — C787 Secondary malignant neoplasm of liver and intrahepatic bile duct: Secondary | ICD-10-CM

## 2016-08-24 DIAGNOSIS — R109 Unspecified abdominal pain: Secondary | ICD-10-CM

## 2016-08-24 DIAGNOSIS — Z95828 Presence of other vascular implants and grafts: Secondary | ICD-10-CM

## 2016-08-24 DIAGNOSIS — R634 Abnormal weight loss: Secondary | ICD-10-CM

## 2016-08-24 DIAGNOSIS — C189 Malignant neoplasm of colon, unspecified: Secondary | ICD-10-CM

## 2016-08-24 DIAGNOSIS — Z5112 Encounter for antineoplastic immunotherapy: Secondary | ICD-10-CM

## 2016-08-24 DIAGNOSIS — R630 Anorexia: Secondary | ICD-10-CM

## 2016-08-24 DIAGNOSIS — C182 Malignant neoplasm of ascending colon: Secondary | ICD-10-CM

## 2016-08-24 DIAGNOSIS — Z5111 Encounter for antineoplastic chemotherapy: Secondary | ICD-10-CM

## 2016-08-24 LAB — COMPREHENSIVE METABOLIC PANEL
ALT: 22 U/L (ref 0–55)
AST: 18 U/L (ref 5–34)
Albumin: 3.9 g/dL (ref 3.5–5.0)
Alkaline Phosphatase: 59 U/L (ref 40–150)
Anion Gap: 9 mEq/L (ref 3–11)
BUN: 22.5 mg/dL (ref 7.0–26.0)
CALCIUM: 9.3 mg/dL (ref 8.4–10.4)
CHLORIDE: 107 meq/L (ref 98–109)
CO2: 25 meq/L (ref 22–29)
CREATININE: 0.9 mg/dL (ref 0.7–1.3)
EGFR: >90 mL/min/{1.73_m2} (ref >90)
GLUCOSE: 95 mg/dL (ref 70–140)
POTASSIUM: 4.4 meq/L (ref 3.5–5.1)
SODIUM: 142 meq/L (ref 136–145)
Total Bilirubin: 0.37 mg/dL (ref 0.20–1.20)
Total Protein: 6.9 g/dL (ref 6.4–8.3)

## 2016-08-24 LAB — IRON AND TIBC
%SAT: 12 % — AB (ref 20–55)
Iron: 46 ug/dL (ref 42–163)
TIBC: 369 ug/dL (ref 202–409)
UIBC: 323 ug/dL (ref 117–376)

## 2016-08-24 LAB — FERRITIN: Ferritin: 56 ng/ml (ref 22–316)

## 2016-08-24 LAB — CBC WITH DIFFERENTIAL/PLATELET
BASO%: 0.8 % (ref 0.0–2.0)
Basophils Absolute: 0 10*3/uL (ref 0.0–0.1)
EOS%: 1.6 % (ref 0.0–7.0)
Eosinophils Absolute: 0.1 10*3/uL (ref 0.0–0.5)
HEMATOCRIT: 35 % — AB (ref 38.4–49.9)
HGB: 12 g/dL — ABNORMAL LOW (ref 13.0–17.1)
LYMPH#: 0.7 10*3/uL — AB (ref 0.9–3.3)
LYMPH%: 18 % (ref 14.0–49.0)
MCH: 29.7 pg (ref 27.2–33.4)
MCHC: 34.1 g/dL (ref 32.0–36.0)
MCV: 87.1 fL (ref 79.3–98.0)
MONO#: 0.6 10*3/uL (ref 0.1–0.9)
MONO%: 15.7 % — ABNORMAL HIGH (ref 0.0–14.0)
NEUT%: 63.9 % (ref 39.0–75.0)
NEUTROS ABS: 2.5 10*3/uL (ref 1.5–6.5)
PLATELETS: 122 10*3/uL — AB (ref 140–400)
RBC: 4.02 10*6/uL — AB (ref 4.20–5.82)
RDW: 16.5 % — ABNORMAL HIGH (ref 11.0–14.6)
WBC: 4 10*3/uL (ref 4.0–10.3)

## 2016-08-24 MED ORDER — CLONIDINE HCL 0.1 MG PO TABS
0.1000 mg | ORAL_TABLET | Freq: Once | ORAL | Status: AC
  Administered 2016-08-24: 0.1 mg via ORAL

## 2016-08-24 MED ORDER — AMLODIPINE BESYLATE 5 MG PO TABS: 5.0000 mg | ORAL_TABLET | Freq: Every day | ORAL | 1 refills | Status: DC

## 2016-08-24 MED ORDER — DEXAMETHASONE SODIUM PHOSPHATE 10 MG/ML IJ SOLN
INTRAMUSCULAR | Status: AC
  Filled 2016-08-24: qty 1

## 2016-08-24 MED ORDER — LEUCOVORIN CALCIUM INJECTION 350 MG
400.0000 mg/m2 | Freq: Once | INTRAMUSCULAR | Status: AC
  Administered 2016-08-24: 840 mg via INTRAVENOUS
  Filled 2016-08-24: qty 42

## 2016-08-24 MED ORDER — FLUOROURACIL CHEMO INJECTION 2.5 GM/50ML
400.0000 mg/m2 | Freq: Once | INTRAVENOUS | Status: AC
  Administered 2016-08-24: 850 mg via INTRAVENOUS
  Filled 2016-08-24: qty 17

## 2016-08-24 MED ORDER — SODIUM CHLORIDE 0.9 % IV SOLN
2375.0000 mg/m2 | INTRAVENOUS | Status: DC
  Administered 2016-08-24: 5000 mg via INTRAVENOUS
  Filled 2016-08-24: qty 100

## 2016-08-24 MED ORDER — DEXAMETHASONE SODIUM PHOSPHATE 10 MG/ML IJ SOLN
10.0000 mg | Freq: Once | INTRAMUSCULAR | Status: AC
  Administered 2016-08-24: 10 mg via INTRAVENOUS

## 2016-08-24 MED ORDER — DEXTROSE 5 % IV SOLN
Freq: Once | INTRAVENOUS | Status: AC
  Administered 2016-08-24: 10:00:00 via INTRAVENOUS

## 2016-08-24 MED ORDER — DEXTROSE 5 % IV SOLN
85.0000 mg/m2 | Freq: Once | INTRAVENOUS | Status: AC
  Administered 2016-08-24: 180 mg via INTRAVENOUS
  Filled 2016-08-24: qty 36

## 2016-08-24 MED ORDER — PALONOSETRON HCL INJECTION 0.25 MG/5ML
INTRAVENOUS | Status: AC
  Filled 2016-08-24: qty 5

## 2016-08-24 MED ORDER — SODIUM CHLORIDE 0.9 % IV SOLN
4.5000 mg/kg | Freq: Once | INTRAVENOUS | Status: AC
  Administered 2016-08-24: 400 mg via INTRAVENOUS
  Filled 2016-08-24: qty 16

## 2016-08-24 MED ORDER — SODIUM CHLORIDE 0.9% FLUSH
10.0000 mL | Freq: Once | INTRAVENOUS | Status: AC
  Administered 2016-08-24: 10 mL
  Filled 2016-08-24: qty 10

## 2016-08-24 MED ORDER — PALONOSETRON HCL INJECTION 0.25 MG/5ML
0.2500 mg | Freq: Once | INTRAVENOUS | Status: AC
  Administered 2016-08-24: 0.25 mg via INTRAVENOUS

## 2016-08-24 NOTE — Telephone Encounter (Signed)
Appointments scheduled per 4.12.18 LOS. Patient had to leave during scheduling to attend infusion appointment. Print outs will be picked up in infusion room.

## 2016-08-24 NOTE — Progress Notes (Signed)
Per [name], ok to treat with FOLFOX elevated blood today. He will be taking a blood pressure medication to lower his BP before Avastin order will be released.   1205- BP is 147/97. [name] is ok to treat with Avastin today. He is being prescribed hypertension medication to take at home.

## 2016-08-25 ENCOUNTER — Encounter: Payer: Self-pay | Admitting: Hematology

## 2016-08-26 ENCOUNTER — Encounter: Payer: Self-pay | Admitting: Hematology

## 2016-08-26 ENCOUNTER — Ambulatory Visit (HOSPITAL_BASED_OUTPATIENT_CLINIC_OR_DEPARTMENT_OTHER): Payer: Self-pay

## 2016-08-26 VITALS — BP 145/93 | HR 96 | Temp 97.0°F | Resp 18

## 2016-08-26 DIAGNOSIS — C787 Secondary malignant neoplasm of liver and intrahepatic bile duct: Secondary | ICD-10-CM

## 2016-08-26 DIAGNOSIS — C189 Malignant neoplasm of colon, unspecified: Secondary | ICD-10-CM

## 2016-08-26 MED ORDER — HEPARIN SOD (PORK) LOCK FLUSH 100 UNIT/ML IV SOLN
500.0000 [IU] | Freq: Once | INTRAVENOUS | Status: AC | PRN
  Administered 2016-08-26: 500 [IU]
  Filled 2016-08-26: qty 5

## 2016-08-26 MED ORDER — SODIUM CHLORIDE 0.9% FLUSH
10.0000 mL | INTRAVENOUS | Status: DC | PRN
  Administered 2016-08-26: 10 mL
  Filled 2016-08-26: qty 10

## 2016-08-28 ENCOUNTER — Other Ambulatory Visit: Payer: Self-pay | Admitting: Hematology

## 2016-08-29 ENCOUNTER — Telehealth: Payer: Self-pay | Admitting: *Deleted

## 2016-08-29 NOTE — Telephone Encounter (Signed)
I agree with the assessment . Please also let pt to keep his armpits dry by using core starch. Do not use topical steroids, OK to use topical anti-fungal if he has at home.   Truitt Merle MD

## 2016-08-29 NOTE — Telephone Encounter (Signed)
Spoke with pt again, and informed pt of Dr. Ernestina Penna instructions below - can use cornstarch under armpits, keep dry, and not use deodorant for now.  Pt voiced understanding.

## 2016-08-29 NOTE — Telephone Encounter (Signed)
Received notifications from after hours nurse call of pt developing rash under armpits.  Spoke with pt and was informed that rashes appeared couple days ago.  Has itchiness, but no fluid filled pockets, not angry red, no puss noted, just red rashes under arms both sides.  Stated he used " clear anti itch " cream, and noted some relief.  Instructed pt to apply Benadryl lotion to rashes and not use deodorant today.   Pt did not change deodorant brand. Pt also reporting mild sensitivity on bottom of feet, but not interfere with mobility.  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   Pt understood to call office back if rashes worsened and/or any symptoms worsened.

## 2016-09-04 ENCOUNTER — Ambulatory Visit (HOSPITAL_COMMUNITY)
Admission: RE | Admit: 2016-09-04 | Discharge: 2016-09-04 | Disposition: A | Discharge status: Home / Self Care | Payer: BLUE CROSS/BLUE SHIELD | Source: Ambulatory Visit | Attending: Hematology | Admitting: Hematology

## 2016-09-04 ENCOUNTER — Encounter: Payer: Self-pay | Admitting: Hematology

## 2016-09-04 ENCOUNTER — Encounter (HOSPITAL_COMMUNITY): Payer: Self-pay

## 2016-09-04 ENCOUNTER — Encounter: Payer: Self-pay | Admitting: Nurse Practitioner

## 2016-09-04 DIAGNOSIS — Z9049 Acquired absence of other specified parts of digestive tract: Secondary | ICD-10-CM | POA: Insufficient documentation

## 2016-09-04 DIAGNOSIS — C189 Malignant neoplasm of colon, unspecified: Secondary | ICD-10-CM

## 2016-09-04 DIAGNOSIS — R590 Localized enlarged lymph nodes: Secondary | ICD-10-CM | POA: Diagnosis not present

## 2016-09-04 DIAGNOSIS — C787 Secondary malignant neoplasm of liver and intrahepatic bile duct: Secondary | ICD-10-CM | POA: Diagnosis not present

## 2016-09-04 MED ORDER — IOPAMIDOL (ISOVUE-300) INJECTION 61%
INTRAVENOUS | Status: AC
  Administered 2016-09-04: 100 mL
  Filled 2016-09-04: qty 100

## 2016-09-05 ENCOUNTER — Encounter: Payer: Self-pay | Admitting: Hematology
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
## 2016-09-06 ENCOUNTER — Other Ambulatory Visit: Payer: Self-pay | Admitting: *Deleted

## 2016-09-06 ENCOUNTER — Encounter: Payer: Self-pay | Admitting: *Deleted

## 2016-09-06 DIAGNOSIS — I10 Essential (primary) hypertension: Secondary | ICD-10-CM | POA: Insufficient documentation

## 2016-09-06 MED ORDER — TRAMADOL HCL 50 MG PO TABS: 50.0000 mg | ORAL_TABLET | Freq: Four times a day (QID) | ORAL | 0 refills | Status: DC | PRN

## 2016-09-07 ENCOUNTER — Ambulatory Visit (HOSPITAL_BASED_OUTPATIENT_CLINIC_OR_DEPARTMENT_OTHER): Payer: BLUE CROSS/BLUE SHIELD

## 2016-09-07 ENCOUNTER — Telehealth: Payer: Self-pay | Admitting: Hematology

## 2016-09-07 ENCOUNTER — Other Ambulatory Visit (HOSPITAL_BASED_OUTPATIENT_CLINIC_OR_DEPARTMENT_OTHER): Payer: BLUE CROSS/BLUE SHIELD

## 2016-09-07 ENCOUNTER — Ambulatory Visit (HOSPITAL_BASED_OUTPATIENT_CLINIC_OR_DEPARTMENT_OTHER): Payer: BLUE CROSS/BLUE SHIELD | Admitting: Hematology

## 2016-09-07 ENCOUNTER — Encounter: Payer: Self-pay | Admitting: Hematology

## 2016-09-07 VITALS — BP 143/99 | HR 72 | Resp 18

## 2016-09-07 VITALS — BP 140/100 | HR 85 | Temp 98.1°F | Resp 18 | Ht 71.0 in | Wt 194.5 lb

## 2016-09-07 DIAGNOSIS — C182 Malignant neoplasm of ascending colon: Secondary | ICD-10-CM

## 2016-09-07 DIAGNOSIS — Z95828 Presence of other vascular implants and grafts: Secondary | ICD-10-CM

## 2016-09-07 DIAGNOSIS — C189 Malignant neoplasm of colon, unspecified: Secondary | ICD-10-CM

## 2016-09-07 DIAGNOSIS — C787 Secondary malignant neoplasm of liver and intrahepatic bile duct: Secondary | ICD-10-CM

## 2016-09-07 DIAGNOSIS — I10 Essential (primary) hypertension: Secondary | ICD-10-CM | POA: Diagnosis not present

## 2016-09-07 DIAGNOSIS — R634 Abnormal weight loss: Secondary | ICD-10-CM | POA: Diagnosis not present

## 2016-09-07 DIAGNOSIS — R630 Anorexia: Secondary | ICD-10-CM

## 2016-09-07 DIAGNOSIS — Z452 Encounter for adjustment and management of vascular access device: Secondary | ICD-10-CM

## 2016-09-07 DIAGNOSIS — Z5111 Encounter for antineoplastic chemotherapy: Secondary | ICD-10-CM

## 2016-09-07 DIAGNOSIS — E860 Dehydration: Secondary | ICD-10-CM

## 2016-09-07 DIAGNOSIS — D500 Iron deficiency anemia secondary to blood loss (chronic): Secondary | ICD-10-CM

## 2016-09-07 DIAGNOSIS — D509 Iron deficiency anemia, unspecified: Secondary | ICD-10-CM | POA: Diagnosis not present

## 2016-09-07 DIAGNOSIS — Z5112 Encounter for antineoplastic immunotherapy: Secondary | ICD-10-CM

## 2016-09-07 LAB — COMPREHENSIVE METABOLIC PANEL
ALBUMIN: 4.1 g/dL (ref 3.5–5.0)
ALK PHOS: 55 U/L (ref 40–150)
ALT: 26 U/L (ref 0–55)
ANION GAP: 10 meq/L (ref 3–11)
AST: 23 U/L (ref 5–34)
BILIRUBIN TOTAL: 0.39 mg/dL (ref 0.20–1.20)
BUN: 24.5 mg/dL (ref 7.0–26.0)
CALCIUM: 9.5 mg/dL (ref 8.4–10.4)
CO2: 24 meq/L (ref 22–29)
CREATININE: 1.1 mg/dL (ref 0.7–1.3)
Chloride: 107 mEq/L (ref 98–109)
EGFR: 79 mL/min/{1.73_m2} — ABNORMAL LOW (ref >90)
Glucose: 76 mg/dl (ref 70–140)
Potassium: 4.8 mEq/L (ref 3.5–5.1)
Sodium: 141 mEq/L (ref 136–145)
TOTAL PROTEIN: 7.1 g/dL (ref 6.4–8.3)

## 2016-09-07 LAB — CBC WITH DIFFERENTIAL/PLATELET
BASO%: 0.9 % (ref 0.0–2.0)
BASOS ABS: 0 10*3/uL (ref 0.0–0.1)
EOS%: 2.7 % (ref 0.0–7.0)
Eosinophils Absolute: 0.1 10*3/uL (ref 0.0–0.5)
HEMATOCRIT: 35.8 % — AB (ref 38.4–49.9)
HGB: 12 g/dL — ABNORMAL LOW (ref 13.0–17.1)
LYMPH#: 0.8 10*3/uL — AB (ref 0.9–3.3)
LYMPH%: 23.5 % (ref 14.0–49.0)
MCH: 29.5 pg (ref 27.2–33.4)
MCHC: 33.5 g/dL (ref 32.0–36.0)
MCV: 88 fL (ref 79.3–98.0)
MONO#: 0.7 10*3/uL (ref 0.1–0.9)
MONO%: 20.8 % — ABNORMAL HIGH (ref 0.0–14.0)
NEUT#: 1.7 10*3/uL (ref 1.5–6.5)
NEUT%: 52.1 % (ref 39.0–75.0)
Platelets: 116 10*3/uL — ABNORMAL LOW (ref 140–400)
RBC: 4.07 10*6/uL — AB (ref 4.20–5.82)
RDW: 16.6 % — ABNORMAL HIGH (ref 11.0–14.6)
WBC: 3.3 10*3/uL — ABNORMAL LOW (ref 4.0–10.3)

## 2016-09-07 MED ORDER — LEUCOVORIN CALCIUM INJECTION 350 MG
400.0000 mg/m2 | Freq: Once | INTRAVENOUS | Status: AC
  Administered 2016-09-07: 840 mg via INTRAVENOUS
  Filled 2016-09-07: qty 42

## 2016-09-07 MED ORDER — SODIUM CHLORIDE 0.9 % IV SOLN
5.0000 mg/kg | Freq: Once | INTRAVENOUS | Status: AC
  Administered 2016-09-07: 450 mg via INTRAVENOUS
  Filled 2016-09-07: qty 16

## 2016-09-07 MED ORDER — SODIUM CHLORIDE 0.9% FLUSH
10.0000 mL | Freq: Once | INTRAVENOUS | Status: AC
  Administered 2016-09-07: 10 mL
  Filled 2016-09-07: qty 10

## 2016-09-07 MED ORDER — DEXAMETHASONE SODIUM PHOSPHATE 10 MG/ML IJ SOLN
10.0000 mg | Freq: Once | INTRAMUSCULAR | Status: AC
  Administered 2016-09-07: 10 mg via INTRAVENOUS

## 2016-09-07 MED ORDER — CLONIDINE HCL 0.1 MG PO TABS
ORAL_TABLET | ORAL | Status: AC
  Filled 2016-09-07: qty 1

## 2016-09-07 MED ORDER — FLUOROURACIL CHEMO INJECTION 2.5 GM/50ML
400.0000 mg/m2 | Freq: Once | INTRAVENOUS | Status: AC
Start: 2016-09-07 — End: 2016-09-07
  Administered 2016-09-07: 850 mg via INTRAVENOUS
  Filled 2016-09-07: qty 17

## 2016-09-07 MED ORDER — DEXAMETHASONE SODIUM PHOSPHATE 10 MG/ML IJ SOLN
INTRAMUSCULAR | Status: AC
Start: 2016-09-07 — End: 2016-09-07
  Filled 2016-09-07: qty 1

## 2016-09-07 MED ORDER — PALONOSETRON HCL INJECTION 0.25 MG/5ML: 0.2500 mg | Freq: Once | INTRAVENOUS | Status: DC

## 2016-09-07 MED ORDER — PALONOSETRON HCL INJECTION 0.25 MG/5ML
INTRAVENOUS | Status: AC
  Filled 2016-09-07: qty 5

## 2016-09-07 MED ORDER — PALONOSETRON HCL INJECTION 0.25 MG/5ML
0.2500 mg | Freq: Once | INTRAVENOUS | Status: AC
  Administered 2016-09-07: 0.25 mg via INTRAVENOUS

## 2016-09-07 MED ORDER — CLONIDINE HCL 0.1 MG PO TABS
0.1000 mg | ORAL_TABLET | Freq: Once | ORAL | Status: AC
  Administered 2016-09-07: 0.1 mg via ORAL

## 2016-09-07 MED ORDER — DEXTROSE 5 % IV SOLN
85.0000 mg/m2 | Freq: Once | INTRAVENOUS | Status: AC
  Administered 2016-09-07: 180 mg via INTRAVENOUS
  Filled 2016-09-07: qty 36

## 2016-09-07 MED ORDER — DEXTROSE 5 % IV SOLN
Freq: Once | INTRAVENOUS | Status: AC
  Administered 2016-09-07: 11:00:00 via INTRAVENOUS

## 2016-09-07 MED ORDER — SODIUM CHLORIDE 0.9 % IV SOLN
2392.0000 mg/m2 | INTRAVENOUS | Status: DC
  Administered 2016-09-07: 5000 mg via INTRAVENOUS
  Filled 2016-09-07: qty 100

## 2016-09-07 NOTE — Patient Instructions (Signed)
[facility] Discharge Instructions for Patients Receiving Chemotherapy  Today you received the following chemotherapy agents Avastin, Oxaliplatin, Leucovorin and 5FU.  To help prevent nausea and vomiting after your treatment, we encourage you to take your nausea medication.   If you develop nausea and vomiting that is not controlled by your nausea medication, call the clinic.   BELOW ARE SYMPTOMS THAT SHOULD BE REPORTED IMMEDIATELY:  *FEVER GREATER THAN 100.5 F  *CHILLS WITH OR WITHOUT FEVER  NAUSEA AND VOMITING THAT IS NOT CONTROLLED WITH YOUR NAUSEA MEDICATION  *UNUSUAL SHORTNESS OF BREATH  *UNUSUAL BRUISING OR BLEEDING  TENDERNESS IN MOUTH AND THROAT WITH OR WITHOUT PRESENCE OF ULCERS  *URINARY PROBLEMS  *BOWEL PROBLEMS  UNUSUAL RASH Items with * indicate a potential emergency and should be followed up as soon as possible.  Feel free to call the clinic you have any questions or concerns. The clinic phone number is [phone].  Please show the [facility] at check-in to the Emergency Department and triage nurse.

## 2016-09-07 NOTE — Telephone Encounter (Signed)
Appointments scheduled for 10/19/16 per 09/07/16 los. Time, per patient. Patient refused a copy of the AVS report and appointment schedule per 09/07/16 los.

## 2016-09-07 NOTE — Progress Notes (Signed)
Ok to treat per Dr Burr Medico with bp 143/102

## 2016-09-09 ENCOUNTER — Encounter: Payer: Self-pay | Admitting: Hematology

## 2016-09-09 ENCOUNTER — Ambulatory Visit (HOSPITAL_BASED_OUTPATIENT_CLINIC_OR_DEPARTMENT_OTHER): Payer: Self-pay

## 2016-09-09 VITALS — BP 152/97 | HR 91 | Temp 97.8°F | Resp 19

## 2016-09-09 DIAGNOSIS — C189 Malignant neoplasm of colon, unspecified: Secondary | ICD-10-CM

## 2016-09-09 DIAGNOSIS — C787 Secondary malignant neoplasm of liver and intrahepatic bile duct: Secondary | ICD-10-CM

## 2016-09-09 MED ORDER — SODIUM CHLORIDE 0.9% FLUSH
10.0000 mL | INTRAVENOUS | Status: DC | PRN
  Administered 2016-09-09: 10 mL
  Filled 2016-09-09: qty 10

## 2016-09-09 MED ORDER — HEPARIN SOD (PORK) LOCK FLUSH 100 UNIT/ML IV SOLN
500.0000 [IU] | Freq: Once | INTRAVENOUS | Status: AC | PRN
  Administered 2016-09-09: 500 [IU]
  Filled 2016-09-09: qty 5

## 2016-09-15 ENCOUNTER — Telehealth: Payer: Self-pay | Admitting: *Deleted

## 2016-09-15 NOTE — Telephone Encounter (Signed)
26 Pt called requesting a CD of his recent CT scan to take to surgeon. Attempted to call xray reading room several times.  Was on hold for 23 minutes with radiology and hung up. 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 1315 When this RN started to return this call she noted that he had spoken with Al Corpus in person about this. Also he was picking up CD at that time.

## 2016-09-18 ENCOUNTER — Telehealth: Payer: Self-pay

## 2016-09-18 ENCOUNTER — Other Ambulatory Visit: Payer: Self-pay | Admitting: Hematology

## 2016-09-18 NOTE — Telephone Encounter (Signed)
Per Dr Burr Medico OV note of 09/07/16

## 2016-09-18 NOTE — Telephone Encounter (Signed)
norvasc refill request received and dose increased to 10 mg daily #60 per Dr Ernestina Penna OV note dated 09/07/16.

## 2016-09-20 ENCOUNTER — Encounter: Payer: Self-pay | Admitting: Hematology

## 2016-09-21 ENCOUNTER — Other Ambulatory Visit (HOSPITAL_BASED_OUTPATIENT_CLINIC_OR_DEPARTMENT_OTHER): Payer: BLUE CROSS/BLUE SHIELD

## 2016-09-21 ENCOUNTER — Encounter: Payer: Self-pay | Admitting: Hematology

## 2016-09-21 ENCOUNTER — Other Ambulatory Visit: Payer: Self-pay | Admitting: *Deleted

## 2016-09-21 ENCOUNTER — Ambulatory Visit (HOSPITAL_BASED_OUTPATIENT_CLINIC_OR_DEPARTMENT_OTHER): Payer: BLUE CROSS/BLUE SHIELD

## 2016-09-21 ENCOUNTER — Ambulatory Visit (HOSPITAL_BASED_OUTPATIENT_CLINIC_OR_DEPARTMENT_OTHER): Payer: BLUE CROSS/BLUE SHIELD | Admitting: Hematology

## 2016-09-21 ENCOUNTER — Ambulatory Visit: Payer: BLUE CROSS/BLUE SHIELD | Admitting: Nutrition

## 2016-09-21 VITALS — BP 144/105 | HR 57 | Temp 97.9°F | Resp 18

## 2016-09-21 DIAGNOSIS — C787 Secondary malignant neoplasm of liver and intrahepatic bile duct: Secondary | ICD-10-CM | POA: Diagnosis not present

## 2016-09-21 DIAGNOSIS — Z452 Encounter for adjustment and management of vascular access device: Secondary | ICD-10-CM | POA: Diagnosis not present

## 2016-09-21 DIAGNOSIS — I10 Essential (primary) hypertension: Secondary | ICD-10-CM

## 2016-09-21 DIAGNOSIS — Z5112 Encounter for antineoplastic immunotherapy: Secondary | ICD-10-CM

## 2016-09-21 DIAGNOSIS — D630 Anemia in neoplastic disease: Secondary | ICD-10-CM

## 2016-09-21 DIAGNOSIS — C182 Malignant neoplasm of ascending colon: Secondary | ICD-10-CM

## 2016-09-21 DIAGNOSIS — C189 Malignant neoplasm of colon, unspecified: Secondary | ICD-10-CM

## 2016-09-21 DIAGNOSIS — Z95828 Presence of other vascular implants and grafts: Secondary | ICD-10-CM

## 2016-09-21 DIAGNOSIS — D500 Iron deficiency anemia secondary to blood loss (chronic): Secondary | ICD-10-CM

## 2016-09-21 DIAGNOSIS — Z5111 Encounter for antineoplastic chemotherapy: Secondary | ICD-10-CM

## 2016-09-21 LAB — IRON AND TIBC
%SAT: 19 % — ABNORMAL LOW (ref 20–55)
IRON: 72 ug/dL (ref 42–163)
TIBC: 374 ug/dL (ref 202–409)
UIBC: 301 ug/dL (ref 117–376)

## 2016-09-21 LAB — COMPREHENSIVE METABOLIC PANEL
ALBUMIN: 3.7 g/dL (ref 3.5–5.0)
ALK PHOS: 49 U/L (ref 40–150)
ALT: 23 U/L (ref 0–55)
ANION GAP: 8 meq/L (ref 3–11)
AST: 19 U/L (ref 5–34)
BUN: 18.4 mg/dL (ref 7.0–26.0)
CALCIUM: 9.2 mg/dL (ref 8.4–10.4)
CHLORIDE: 108 meq/L (ref 98–109)
CO2: 24 mEq/L (ref 22–29)
CREATININE: 1 mg/dL (ref 0.7–1.3)
EGFR: 88 mL/min/{1.73_m2} — ABNORMAL LOW (ref >90)
Glucose: 83 mg/dl (ref 70–140)
Potassium: 4.4 mEq/L (ref 3.5–5.1)
Sodium: 141 mEq/L (ref 136–145)
Total Bilirubin: 0.35 mg/dL (ref 0.20–1.20)
Total Protein: 6.7 g/dL (ref 6.4–8.3)

## 2016-09-21 LAB — CBC WITH DIFFERENTIAL/PLATELET
BASO%: 0.6 % (ref 0.0–2.0)
BASOS ABS: 0 10*3/uL (ref 0.0–0.1)
EOS%: 2.2 % (ref 0.0–7.0)
Eosinophils Absolute: 0.1 10*3/uL (ref 0.0–0.5)
HEMATOCRIT: 34.4 % — AB (ref 38.4–49.9)
HEMOGLOBIN: 11.3 g/dL — AB (ref 13.0–17.1)
LYMPH#: 0.7 10*3/uL — AB (ref 0.9–3.3)
LYMPH%: 21.4 % (ref 14.0–49.0)
MCH: 29.9 pg (ref 27.2–33.4)
MCHC: 32.8 g/dL (ref 32.0–36.0)
MCV: 91 fL (ref 79.3–98.0)
MONO#: 0.5 10*3/uL (ref 0.1–0.9)
MONO%: 16.7 % — ABNORMAL HIGH (ref 0.0–14.0)
NEUT#: 1.9 10*3/uL (ref 1.5–6.5)
NEUT%: 59.1 % (ref 39.0–75.0)
PLATELETS: 98 10*3/uL — AB (ref 140–400)
RBC: 3.78 10*6/uL — ABNORMAL LOW (ref 4.20–5.82)
RDW: 16.8 % — AB (ref 11.0–14.6)
WBC: 3.2 10*3/uL — ABNORMAL LOW (ref 4.0–10.3)

## 2016-09-21 LAB — CEA (IN HOUSE-CHCC): CEA (CHCC-In House): 3.98 ng/mL (ref 0.00–5.00)

## 2016-09-21 LAB — FERRITIN: FERRITIN: 61 ng/mL (ref 22–316)

## 2016-09-21 LAB — UA PROTEIN, DIPSTICK - CHCC: PROTEIN: NEGATIVE mg/dL

## 2016-09-21 MED ORDER — LISINOPRIL 10 MG PO TABS: 10.0000 mg | ORAL_TABLET | Freq: Every day | ORAL | 1 refills | Status: DC

## 2016-09-21 MED ORDER — CLONIDINE HCL 0.1 MG PO TABS
ORAL_TABLET | ORAL | Status: AC
  Filled 2016-09-21: qty 1

## 2016-09-21 MED ORDER — CLONIDINE HCL 0.1 MG PO TABS
0.1000 mg | ORAL_TABLET | Freq: Once | ORAL | Status: AC
  Administered 2016-09-21: 0.1 mg via ORAL

## 2016-09-21 MED ORDER — DEXAMETHASONE SODIUM PHOSPHATE 10 MG/ML IJ SOLN
INTRAMUSCULAR | Status: AC
  Filled 2016-09-21: qty 1

## 2016-09-21 MED ORDER — DEXAMETHASONE SODIUM PHOSPHATE 10 MG/ML IJ SOLN
10.0000 mg | Freq: Once | INTRAMUSCULAR | Status: AC
  Administered 2016-09-21: 10 mg via INTRAVENOUS

## 2016-09-21 MED ORDER — SODIUM CHLORIDE 0.9 % IV SOLN
5.0000 mg/kg | Freq: Once | INTRAVENOUS | Status: AC
  Administered 2016-09-21: 450 mg via INTRAVENOUS
  Filled 2016-09-21: qty 16

## 2016-09-21 MED ORDER — DEXTROSE 5 % IV SOLN
60.0000 mg/m2 | Freq: Once | INTRAVENOUS | Status: AC
  Administered 2016-09-21: 125 mg via INTRAVENOUS
  Filled 2016-09-21: qty 10

## 2016-09-21 MED ORDER — FLUOROURACIL CHEMO INJECTION 2.5 GM/50ML
400.0000 mg/m2 | Freq: Once | INTRAVENOUS | Status: AC
Start: 2016-09-21 — End: 2016-09-21
  Administered 2016-09-21: 850 mg via INTRAVENOUS
  Filled 2016-09-21: qty 17

## 2016-09-21 MED ORDER — SODIUM CHLORIDE 0.9% FLUSH
10.0000 mL | INTRAVENOUS | Status: DC | PRN
  Filled 2016-09-21: qty 10

## 2016-09-21 MED ORDER — PALONOSETRON HCL INJECTION 0.25 MG/5ML
INTRAVENOUS | Status: AC
  Filled 2016-09-21: qty 5

## 2016-09-21 MED ORDER — SODIUM CHLORIDE 0.9% FLUSH
10.0000 mL | Freq: Once | INTRAVENOUS | Status: AC
  Administered 2016-09-21: 10 mL
  Filled 2016-09-21: qty 10

## 2016-09-21 MED ORDER — PALONOSETRON HCL INJECTION 0.25 MG/5ML
0.2500 mg | Freq: Once | INTRAVENOUS | Status: AC
  Administered 2016-09-21: 0.25 mg via INTRAVENOUS

## 2016-09-21 MED ORDER — SODIUM CHLORIDE 0.9 % IV SOLN
2390.0000 mg/m2 | INTRAVENOUS | Status: DC
  Administered 2016-09-21: 5000 mg via INTRAVENOUS
  Filled 2016-09-21: qty 100

## 2016-09-21 MED ORDER — LEUCOVORIN CALCIUM INJECTION 350 MG
400.0000 mg/m2 | Freq: Once | INTRAMUSCULAR | Status: AC
  Administered 2016-09-21: 840 mg via INTRAVENOUS
  Filled 2016-09-21: qty 42

## 2016-09-21 MED ORDER — DEXTROSE 5 % IV SOLN
Freq: Once | INTRAVENOUS | Status: AC
  Administered 2016-09-21: 11:00:00 via INTRAVENOUS

## 2016-09-21 MED ORDER — SODIUM CHLORIDE 0.9 % IV SOLN
Freq: Once | INTRAVENOUS | Status: AC
Start: 2016-09-21 — End: 2016-09-21
  Administered 2016-09-21: 10:00:00 via INTRAVENOUS

## 2016-09-21 NOTE — Progress Notes (Signed)
Nicholas Lowery  [phone] [phone]  Clinic Follow Up Note  Patient Care Team: Curlene Labrum, MD as PCP - General (Family Medicine) 09/21/2016  CHIEF COMPLAINTS:  Follow up metastatic right colon cancer   Oncology History   Cancer Staging Metastatic colon cancer to liver [facility]) Staging form: Colon and Rectum, AJCC 8th Edition - Clinical stage from 06/01/2016: Stage IVA (cTX, cNX, pM1a) - Signed by Truitt Merle, MD on 07/04/2016 - Pathologic stage from 06/14/2016: Stage IVA (pT4b(m), pN2b, pM1a) - Signed by Truitt Merle, MD on 07/04/2016       Metastatic colon cancer to liver ([place])   04/2015 Procedure    Colonoscopy by Dr. Ladona Horns. It showed showed 2 sessile polyps ready between 3-5 mm in size located 20 cm (A, B) from the point of entry, polypectomy was performed. Pedunculated polyp was found in the ascending colon (C), polypectomy was performed, and additional polyp (D) was found 30 cm from the point of entry, removed      04/2015 Pathology Results    tubular adenoma (A and B), and well differentiated adenocarcinoma arising from tubulovillous adenoma (C) and well differentiated adenocarcinoma arising from severe dysplasia to intramucosal carcinoma within tubular adenoma.       04/2015 Initial Diagnosis    Metastatic colon cancer to liver ([place])      05/29/2016 Imaging    CT abdomen and pelvis with contrast showed an apple core like stricture in right colon just above the cecum, measuring 3.2 cm in lengths. This is highly suspicious for malignancy. Small lymph node a noticed he had adjacent mesentery, measuring 8 mm. There is a low-density lesion within the inferior aspect of the right hepatic lobe measuring 1.7 cm, suspicious for metastasis.       06/06/2016 Tumor Marker    CEA 9.99      06/08/2016 PET scan    IMPRESSION: Approximately 3 cm hypermetabolic mass in the ascending colon, consistent with primary colon carcinoma. This mass results in  colonic obstruction and small bowel dilatation. Additional areas of hypermetabolic wall thickening in the cecum may represent other sites of colon carcinoma or colitis. Mild hypermetabolic lymphadenopathy in right pericolonic region, porta hepatis, and aortocaval space, consistent with metastatic disease. Mild hypermetabolic mediastinal lymphadenopathy also seen, and thoracic lymph node metastases cannot be excluded. Solitary hypermetabolic focus in inferior right hepatic lobe, consistent with liver metastasis. Consider abdomen MRI without and with contrast for further evaluation.      06/14/2016 Surgery    Hand assisted right hemicolectomy and small bowel resection for colon cancer, liver biopsy, by Dr. Barry Dienes      06/14/2016 Pathology Results    Right hemicolectomy showed invasive well to moderately differentiated adenocarcinoma, 2 foci measuring 7.5 cm and 4.5 cm, tumor invades through full thickness of colon, to the seroma and involve the Small Bowel, Surgical Margins Were Negative, 24 Out Of 64 Lymph Nodes Were Positive, Extracapsular Extension Identified, Multiple Satellite Tumor Deposits Present, Liver Biopsy Showed Metastatic Adenocarcinoma.        06/14/2016 Miscellaneous    Tumor MMR normal, MSI stable       06/14/2016 Miscellaneous    Foundation one genomic testing showed K-ras G12 D mutation, APC and TP53 mutation. No BRAF and NRAS mutation. MSI-stable, tumor burden low.      07/06/2016 Tumor Marker    CEA 13.69      07/13/2016 -  Chemotherapy    mFOLFOX, every 2 weeks, started on 07/14/2015, Avastin added from  cycle 3      07/27/2016 Tumor Marker    CEA 15.85      09/04/2016 Imaging    Ct C/A/P W Contrast IMPRESSION: Interval right colectomy. Stable small liver metastasis in the inferior right hepatic lobe. Stable mild porta hepatis and aortocaval lymphadenopathy. Stable mild mediastinal lymphadenopathy. No new or progressive metastatic disease identified within the chest,  abdomen, or pelvis.       HISTORY OF PRESENTING ILLNESS (06/01/2016):  Nicholas Lowery 56 y.o. male is here because of his recently abdominal CT which is highly suspicious for metastatic colon cancer. He is accompanied by his wife to my clinic today. He was referred by his primary care physician Dr. Pleas Koch.   He had colonoscopy in 2016 for mild rectal bleeding, he describe small amount fresh blood mixed with stool, he has no other constitutional symptoms at that time. He was referred to gastroenterologist Dr. Ladona Horns and underwent a colonoscopy in December 2016. The colonoscopy showed 2 sessile polyps ready between 3-5 mm in size located 20 cm (A, B) from the point of entry, polypectomy was performed. Pedunculated polyp was found in the ascending colon (C), polypectomy was performed, and additional polyp (D) was found 30 cm from the point of entry, removed. The pathology reviewed tubular adenoma (A and B), and well differentiated adenocarcinoma arising from tubulovillous adenoma (C) and well differentiated adenocarcinoma arising from severe dysplasia to intramucosal carcinoma within tubular adenoma. Dr. Tye Maryland tried multiple times to reach patient, but patient sought they were calling him about the bill, and did not return the phone calls. He was not aware the cancer diagnosis until recently.   He started having diarrhea and vomiting in mid Dec 2017, and felt a "pop" in right side abdomen, he was seen at urgent care, and was treated with antiemetics, and IVF, lab tests were OK. Due to his persistent intermittent diarrhea and epigastric pain since then, he was seen by PCP and he eventually had CT abdomen and pelvis scan which showed a upper core lesion in the ascending colon, and I'll 1.7 cm lesion in the liver, highly suspicious for metastasis. He was referred to [place] for further evaluation.  He has lost 30 lbs in the past one month, has low appetite, eats a small meals 1-2 times a day. He has moderate  fatigue, able to tolerate routine activities including his work, but feels exhausted at the end of study. He has occasional constipation, denies recent rectal bleeding.  CURRENT THERAPY: mFOLFOX, every 2 weeks, started on 07/14/2015, Avastin added from cycle 3. Lower dose of Oxaliplatin 09/21/16.  Changed to FOLFIRINOX on 10/05/16   INTERIM HISTORY: [name] returns today for follow up and cycle 6 chemotherapy. He presents in the infusion room today with his wife. He self referred to [facility] and saw Dr. Rigoberto Noel for a second opinion. He says they want him to get an MRI to get a better understanding of the cancer and would want to do surgery to remove the tumor. They feel they can be more aggressive with treatment moving forward. His wife wants to know if they remove the lymph node will they still have positive margins. 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 He also reports to gaining weight. The Wife feels the best plan is to move forward with Chemo before trying surgery, she feels they are  a little disappointed the current treatment has not worked enough so far.   Marland Kitchen  MEDICAL HISTORY:  Past Medical History:  Diagnosis Date  . Anxiety   . Cancer of ascending colon ([place])   . Seasonal allergies     SURGICAL HISTORY: Past Surgical History:  Procedure Laterality Date  . APPENDECTOMY  1992  . CATARACT EXTRACTION W/ INTRAOCULAR LENS IMPLANT Left 04/2016  . COLON SURGERY    . COLONOSCOPY W/ BIOPSIES AND POLYPECTOMY  04/2015  . INGUINAL HERNIA REPAIR Right 1982  . KNEE ARTHROSCOPY W/ MENISCECTOMY Right 1998  . LAPAROSCOPIC RIGHT COLECTOMY N/A 06/14/2016   Procedure: LAPAROSCOPIC HAND ASSISTED HEMICOLECTOMY AND SMALL BOWEL RESECTION.;  Surgeon: [name], MD;  Location: [place];  Service: General;  Laterality: N/A;  . LIVER BIOPSY Right 06/14/2016   Procedure: LIVER BIOPSY;   Surgeon: [name], MD;  Location: [place];  Service: General;  Laterality: Right;  Right Inferior Liver  . PORTACATH PLACEMENT N/A 07/06/2016   Procedure: INSERTION PORT-A-CATH;  Surgeon: [name], MD;  Location: [place];  Service: General;  Laterality: N/A;  . VASECTOMY  2005    SOCIAL HISTORY: Social History   Social History  . Marital status: Married    Spouse name: N/A  . Number of children: N/A  . Years of education: N/A   Occupational History  . Not on file.   Social History Main Topics  . Smoking status: Former Smoker    Years: 2.00    Types: Cigarettes    Quit date: 35  . Smokeless tobacco: Never Used  . Alcohol use 2.4 oz/week    4 Cans of beer per week     Comment: social, none since colon surgery  . Drug use: No     Comment: 06/15/2016 "nothing since college"  . Sexual activity: Yes   Other Topics Concern  . Not on file   Social History Narrative  . No narrative on file   He is married.  They have 3 boys, 72-12 yo. He works for a [employer], desk job.   FAMILY HISTORY: Family History  Problem Relation Age of Onset  . Cancer Mother        lung cancer  . Stroke Mother   . Hypertension Father   . CAD Father   . Cancer Maternal Grandfather        prostate cancer     ALLERGIES:  is allergic to penicillins.  MEDICATIONS:  Current Outpatient Prescriptions  Medication Sig Dispense Refill  . amLODipine (NORVASC) 5 MG tablet Take 2 tablets (10 mg total) by mouth daily. 60 tablet 0  . celecoxib (CELEBREX) 200 MG capsule Take 200 mg by mouth daily.  1  . diphenoxylate-atropine (LOMOTIL) 2.5-0.025 MG tablet TAKE TWO TABLETS BY MOUTH FOUR TIMES DAILY AS NEEDED FOR FOR DIARRHEA OR loose stools  0  . docusate sodium (COLACE) 100 MG capsule Take 100 mg by mouth daily.    Marland Kitchen FLUoxetine (PROZAC) 40 MG capsule Take 40 mg by mouth daily.    . IRON PO Take 1 tablet by mouth daily.    Marland Kitchen lidocaine-prilocaine (EMLA) cream Apply to  affected area once 30 g 3  . lisinopril (PRINIVIL) 10 MG tablet Take 1 tablet (10 mg total) by mouth daily. 30 tablet 1  . ondansetron (ZOFRAN ODT) 8 MG disintegrating tablet Take 1 tablet (8 mg total) by mouth every 8 (eight) hours as needed for nausea or vomiting. (Patient not taking: Reported on  08/10/2016) 30 tablet 2  . Probiotic Product (PROBIOTIC PO) Take 1 capsule by mouth daily.    . prochlorperazine (COMPAZINE) 10 MG tablet Take 1 tablet (10 mg total) by mouth every 6 (six) hours as needed for nausea or vomiting. (Patient not taking: Reported on 08/10/2016) 30 tablet 2  . promethazine (PHENERGAN) 25 MG tablet Take 25 mg by mouth every 6 (six) hours as needed. for nausea  1  . traMADol (ULTRAM) 50 MG tablet Take 1-2 tablets (50-100 mg total) by mouth every 6 (six) hours as needed for moderate pain or severe pain. 30 tablet 0   No current facility-administered medications for this visit.    Facility-Administered Medications Ordered in Other Visits  Medication Dose Route Frequency Provider Last Rate Last Dose  . clindamycin (CLEOCIN) 900 mg in dextrose 5 % 50 mL IVPB  900 mg Intravenous 60 min Pre-Op [name], MD       And  . gentamicin (GARAMYCIN) 450 mg in dextrose 5 % 50 mL IVPB  5 mg/kg Intravenous 60 min Pre-Op [name], MD        REVIEW OF SYSTEMS:  Constitutional: Denies fevers, chills or abnormal night sweats (+) tenderness around incision, (+) taste changes, metallic taste sensation Eyes: Denies blurriness of vision, double vision or watery eyes Ears, nose, mouth, throat, and face: Denies mucositis or sore throat (+) jaw pain and stiffness Respiratory: Denies cough, dyspnea or wheezes Cardiovascular: Denies palpitation, chest discomfort or lower extremity swelling Gastrointestinal:  Denies nausea, heartburn or change in bowel habits, Skin: (+) bilateral upper extremity rash Lymphatics: Denies new lymphadenopathy or easy bruising Neurological: (+) neuropathy in  fingers MSK: Muscle weakness in hands Behavioral/Psych: Mood is stable, no new changes  All other systems were reviewed with the patient and are negative.  PHYSICAL EXAMINATION:  ECOG PERFORMANCE STATUS: 1 - Symptomatic but completely ambulatory Blood pressure 151/104, heart rate 65, respiratory rate 18, temperature 97.9, pulse ox 100%. GENERAL:alert, no distress and comfortable. SKIN: skin color, texture, turgor are normal, no rashes or significant lesions EYES: normal, conjunctiva are pink and non-injected, sclera clear OROPHARYNX:no exudate, no erythema and lips, buccal mucosa, and tongue normal  NECK: supple, thyroid normal size, non-tender, without nodularity LYMPH:  no palpable lymphadenopathy in the cervical, axillary or inguinal LUNGS: clear to auscultation and percussion with normal breathing effort HEART: regular rate & rhythm and no murmurs and no lower extremity edema ABDOMEN:abdomen soft, Surgical scar in the midline around the umbilical has healed well. non-tender and normal bowel sounds Musculoskeletal:no cyanosis of digits and no clubbing  PSYCH: alert & oriented x 3 with fluent speech NEURO: no focal motor/sensory deficits  LABORATORY DATA:  I have reviewed the data as listed CBC Latest Ref Rng & Units 09/21/2016 09/07/2016 08/24/2016  WBC 4.0 - 10.3 10e3/uL 3.2(L) 3.3(L) 4.0  Hemoglobin 13.0 - 17.1 g/dL 11.3(L) 12.0(L) 12.0(L)  Hematocrit 38.4 - 49.9 % 34.4(L) 35.8(L) 35.0(L)  Platelets 140 - 400 10e3/uL 98(L) 116(L) 122(L)   CMP Latest Ref Rng & Units 09/21/2016 09/07/2016 08/24/2016  Glucose 70 - 140 mg/dl 83 76 95  BUN 7.0 - 26.0 mg/dL 18.4 24.5 22.5  Creatinine 0.7 - 1.3 mg/dL 1.0 1.1 0.9  Sodium 136 - 145 mEq/L 141 141 142  Potassium 3.5 - 5.1 mEq/L 4.4 4.8 4.4  Chloride 101 - 111 mmol/L - - -  CO2 22 - 29 mEq/L _0 Calcium 8.4 - 10.4 mg/dL 9.2 9.5 9.3  Total Protein 6.4 - 8.3  g/dL 6.7 7.1 6.9  Total Bilirubin 0.20 - 1.20 mg/dL 0.35 0.39 0.37  Alkaline  Phos 40 - 150 U/L 49 55 59  AST 5 - 34 U/L _0 ALT 0 - 55 U/L _1 Surgical path   Diagnosis 06/14/2016 1. Colon, segmental resection for tumor, Right Ascending Hemicolectomy and Small Bowel - INVASIVE WELL TO MODERATELY DIFFERENTIATED ADENOCARCINOMA. - TWO TUMOR FOCI MEASURING 7.5 CM AND 4.5 CM IN GREATEST DIMENSION. - TUMOR INVADES THROUGH FULL THICKNESS OF COLON, THROUGH THE SEROSA TO INVOLVE THE SMALL BOWEL. - MARGINS ARE NEGATIVE. - TWENTY FOUR OF SIXTY FOUR LYMPH NODES POSITIVE FOR METASTATIC ADENOCARCINOMA (24/64). - EXTRACAPSULAR EXTENSION IDENTIFIED - MULTIPLE SATELLITE TUMOR DEPOSITS PRESENT. - SEE ONCOLOGY TEMPLATE. 2. Liver, needle/core biopsy, Right Inferior - POSITIVE FOR METASTATIC ADENOCARCINOMA.    RADIOGRAPHIC STUDIES: I have personally reviewed his outside CT scan from 05/29/2016 and agreed with the findings in the report. Ct Chest W Contrast  Result Date: 09/04/2016 CLINICAL DATA:  Followup metastatic colon carcinoma. Right upper quadrant pain. Currently undergoing chemotherapy. Restaging. EXAM: CT CHEST, ABDOMEN, AND PELVIS WITH CONTRAST TECHNIQUE: Multidetector CT imaging of the chest, abdomen and pelvis was performed following the standard protocol during bolus administration of intravenous contrast. CONTRAST:  168m ISOVUE-300 IOPAMIDOL (ISOVUE-300) INJECTION 61% COMPARISON:  PET-CT on 06/08/2016 and AP CT on 05/29/2016 FINDINGS: CT CHEST FINDINGS Cardiovascular: No acute findings. Coronary artery calcification. Mediastinum/Lymph Nodes: 10 mm right paratracheal lymph node on image 16/2, compared with 11 mm previously. 12 mm left mid paraesophageal lymph node on image 28/2, stable in size compared to previous study. No new or increased areas of lymphadenopathy identified. Lungs/Pleura: No pulmonary infiltrate or mass identified. No effusion present. Musculoskeletal:  No suspicious bone lesions identified. CT ABDOMEN AND PELVIS FINDINGS Hepatobiliary: 1.7 cm  hypovascular mass in the inferior right hepatic lobe on image 73/2 remains stable. No new or enlarging masses are identified. Gallbladder is unremarkable. Pancreas:  No mass or inflammatory changes. Spleen:  Within normal limits in size and appearance. Adrenals/Urinary tract:  No masses or hydronephrosis. Stomach/Bowel: Patient has undergone right colectomy since previous study. No evidence of mass or bowel wall thickening. Vascular/Lymphatic: Previously seen right pericolonic lymphadenopathy is no longer visualized. Mild porta hepatis lymphadenopathy is stable, with largest lymph node measuring 2.0 cm on image 65/2 compared to 2.1 cm previously. A 12 mm aorto-caval lymph node is seen on image 71/2 compared to 14 mm previously. No new or increased areas of lymphadenopathy are identified. No abdominal aortic aneurysm. Reproductive:  No mass or other significant abnormality identified. Other:  None. Musculoskeletal:  No suspicious bone lesions identified. IMPRESSION: Interval right colectomy. Stable small liver metastasis in the inferior right hepatic lobe. Stable mild porta hepatis and aortocaval lymphadenopathy. Stable mild mediastinal lymphadenopathy. No new or progressive metastatic disease identified within the chest, abdomen, or pelvis. Electronically Signed   By: JEarle GellM.D.   On: 09/04/2016 14:03   Ct Abdomen Pelvis W Contrast  Result Date: 09/04/2016 CLINICAL DATA:  Followup metastatic colon carcinoma. Right upper quadrant pain. Currently undergoing chemotherapy. Restaging. EXAM: CT CHEST, ABDOMEN, AND PELVIS WITH CONTRAST TECHNIQUE: Multidetector CT imaging of the chest, abdomen and pelvis was performed following the standard protocol during bolus administration of intravenous contrast. CONTRAST:  1020mISOVUE-300 IOPAMIDOL (ISOVUE-300) INJECTION 61% COMPARISON:  PET-CT on 06/08/2016 and AP CT on 05/29/2016 FINDINGS: CT CHEST FINDINGS Cardiovascular: No acute findings. Coronary artery calcification.  Mediastinum/Lymph Nodes: 10 mm right paratracheal  lymph node on image 16/2, compared with 11 mm previously. 12 mm left mid paraesophageal lymph node on image 28/2, stable in size compared to previous study. No new or increased areas of lymphadenopathy identified. Lungs/Pleura: No pulmonary infiltrate or mass identified. No effusion present. Musculoskeletal:  No suspicious bone lesions identified. CT ABDOMEN AND PELVIS FINDINGS Hepatobiliary: 1.7 cm hypovascular mass in the inferior right hepatic lobe on image 73/2 remains stable. No new or enlarging masses are identified. Gallbladder is unremarkable. Pancreas:  No mass or inflammatory changes. Spleen:  Within normal limits in size and appearance. Adrenals/Urinary tract:  No masses or hydronephrosis. Stomach/Bowel: Patient has undergone right colectomy since previous study. No evidence of mass or bowel wall thickening. Vascular/Lymphatic: Previously seen right pericolonic lymphadenopathy is no longer visualized. Mild porta hepatis lymphadenopathy is stable, with largest lymph node measuring 2.0 cm on image 65/2 compared to 2.1 cm previously. A 12 mm aorto-caval lymph node is seen on image 71/2 compared to 14 mm previously. No new or increased areas of lymphadenopathy are identified. No abdominal aortic aneurysm. Reproductive:  No mass or other significant abnormality identified. Other:  None. Musculoskeletal:  No suspicious bone lesions identified. IMPRESSION: Interval right colectomy. Stable small liver metastasis in the inferior right hepatic lobe. Stable mild porta hepatis and aortocaval lymphadenopathy. Stable mild mediastinal lymphadenopathy. No new or progressive metastatic disease identified within the chest, abdomen, or pelvis. Electronically Signed   By: Earle Gell M.D.   On: 09/04/2016 14:03    ASSESSMENT & PLAN:  56 y.o. Caucasian male, without significant past medical history, presented with rectal bleeding in December 2016, colonoscopy showed 4  polyps, 2 of them showed invasive adenocarcinoma, unfortunately he was not aware of the pathology findings and was not treated. He now presented with fatigue, weight loss, anorexia and abdominal pain  1. Right colon cancer with liver and node metastasis, [id number], stage IV, MSI-stable  -I previously reviewed her PET scan findings, which showed solitary liver metastasis, abdominal and possible thoracic node metastasis  -His liver biopsy confirmed metastasis -Due to the biopsy action, he underwent upfront hemicolectomy. I previously reviewed his surgical pathology findings, which showed 2 primary right colon cancer, very locally advanced with 24 lymph nodes positive, surgical margins were negative. -We previously reviewed the patient's CEA from 06/06/16 which was 9.99. -I discussed his Foundation one genomic testing results, which showed care arrest mutation, and as high stable, low tumor burden. So he would not benefit from EGFR inhibitor, or immunotherapy alone. -The patient has started first-line chemotherapy FOLFOX, tolerated this cycle very well. We'll continue. Avastin was added from cycle 3 -The goal for chemotherapy is palliative, giving his metastatic disease. -He has solitary liver metastasis, but multiple mild hypermetabolic adenopathy in mediastinum and abdomen. We previously discussed the possible local therapy options, such as liver metastectomy or ablation if he has great response to chemo and his node metastasis resolves  -restaging CT from 09/04/2016 reviewed with pt, stable disease. Will continue chemo -he has developed more side effects from chemo FOLFOX, and a worsening some cytopenia, I'll reduce his oxaliplatin dose today. -Patient really wants to get liver surgery if possible. We today again reviewed his initial PET scan, his multiple abdominal lymph nodes are hypermetabolic, highly suspicious for metastasis. His metastatic disease in liver and nodes are probably not completely  resectable.  -Due to his overall limited response to FOLFOX, and his wish to pursue surgery, I recommend him to change chemo to FOLFIRINOX with low dose oxaliplatin, hopefully  he will response better. Potential side effects and benefit of adding irinotecan were discussed with patient, he agrees to proceed. We'll start from Cycle. -I discussed the above with his surgeon Dr. Barry Dienes this morning, she is very concerned about his node metastasis, and does not recommend liver surgery at this stage. She will commence patient is to continue chemotherapy and repeat restaging scan in 2-3 months. -I will repeat PET scan in 3 months for restaging purposes. He may be a candidate for liver surgery or ablation if he has excellent response to chemo and nodes become negative on next PET. -After the lengthy discussion of above, patient comes more reassured, and agrees with the plan to continue chemotherapy, changed the treatment plan to FOLFIRINOX in 2 weeks.   2. Hypertension -His blood pressure has been high lately, I have previously given him clonidine 0.1 mg in the infusion room before Avastin -We previously discussed Avastin can cause hypertension -I have started him on amlodipine, and increase dose to 10 million daily -He does not want to follow-up with his primary care physician. -His blood picture remains to be high, especially diastolic blood pressure. I'll add on lisinopril 10 mg daily for him, he will start tomorrow.  3. Anemia of iron deficiency  -He has mild anemia, likely related to his colon cancer bleeding. -His HGB was low following surgery. I have order a repeat lab. -I previously recommend him to take oral iron supplement over-the-counter, potential side effects of constipation and gastric or discomfort or discussed with him. He stopped taking this before surgery. -I previously advised him to begin taking oral iron supplements again. - Getting better ,11.3 previously. - Iron levels previously on  08/24/16 at 46. - We will continue to monitor iron levels   4. Anorexia and weight loss -Secondary to underlying malignancy -I previously encouraged him to try nutritional supplement, such as boost or initial -The patient has previously gained some weight .  5. Episodes of finger weakness and numbness, arm pain -Likely related to chemotherapy, he had a few episodes of finger numbness, and weakness, resolved subsequently. -We'll continue monitoring closely.  6. Goal of care discussion  -We previously discussed the incurable nature of his cancer, and the overall poor prognosis, especially if he does not have good response to chemotherapy or progress on chemo -The patient understands the goal of care is palliative.  -He is full code now    Plan  -Lab reviewed, we'll reduce Oxaliplatin dose to 44m/m2 due to side effects and some cytopenia -will add irinotecan and neulasta from next cycle  -f/u in 2 weeks before FOLFIRINOX     No orders of the defined types were placed in this encounter.   All questions were answered.   The patient knows to call the clinic with any problems, questions or concerns. I spent 30 minutes counseling the patient face to face. The total time spent in the appointment was 35 minutes and more than 50% was on counseling.   This document serves as a record of services personally performed by YTruitt Merle MD. It was created on her behalf by AJoslyn Devon a trained medical scribe. The creation of this record is based on the scribe's personal observations and the provider's statements to them. This document has been checked and approved by the attending provider. .Truitt Merle MD 09/21/2016

## 2016-09-21 NOTE — Progress Notes (Signed)
Patient stated that a week and three days after his previous treatment he went golfing and woke up the next morning and was unable to feel or control part of his right hand. Patient states he regained control and feeling by the next day and states he no longer has any discomfort or numbness or tingling to that hand. [name] notified and to see patient at bedside.   Per [name] okay to treat with platelets of 98 and blood pressure of 145/104. Per [name], patient is to receive Clonidine 0.1mg   before Avastin infusion.

## 2016-09-21 NOTE — Patient Instructions (Signed)
[facility] Discharge Instructions for Patients Receiving Chemotherapy  Today you received the following chemotherapy agents: Avastin, Oxaliplatin, Leucovorin and Adrucil   To help prevent nausea and vomiting after your treatment, we encourage you to take your nausea medication as directed.    If you develop nausea and vomiting that is not controlled by your nausea medication, call the clinic.   BELOW ARE SYMPTOMS THAT SHOULD BE REPORTED IMMEDIATELY:  *FEVER GREATER THAN 100.5 F  *CHILLS WITH OR WITHOUT FEVER  NAUSEA AND VOMITING THAT IS NOT CONTROLLED WITH YOUR NAUSEA MEDICATION  *UNUSUAL SHORTNESS OF BREATH  *UNUSUAL BRUISING OR BLEEDING  TENDERNESS IN MOUTH AND THROAT WITH OR WITHOUT PRESENCE OF ULCERS  *URINARY PROBLEMS  *BOWEL PROBLEMS  UNUSUAL RASH Items with * indicate a potential emergency and should be followed up as soon as possible.  Feel free to call the clinic you have any questions or concerns. The clinic phone number is [phone].  Please show the CHEMO ALERT CARD at check-in to the Emergency Department and triage nurse.   

## 2016-09-21 NOTE — Progress Notes (Signed)
Nutrition follow-up completed with patient during chemotherapy for metastatic colon cancer. Last weight documented was 194.5 pounds April 26. Patient reports, weight is 201 pounds today. Labs were reviewed. Patient is eating well, especially on the week after chemotherapy. No nutrition impact symptoms.  Nutrition diagnosis: Food and nutrition related knowledge deficit improved.  Intervention: Patient educated on healthy plant-based diet with adequate protein for weight maintenance. Teach back method used.  Monitoring, evaluation, goals: Patient will work to continue adequate calories and protein for minimal weight loss.  Next visit: Thursday, June 7, during chemotherapy.  **Disclaimer: This note was dictated with voice recognition software. Similar sounding words can inadvertently be transcribed and this note may contain transcription errors which may not have been corrected upon publication of note.**

## 2016-09-23 ENCOUNTER — Ambulatory Visit (HOSPITAL_BASED_OUTPATIENT_CLINIC_OR_DEPARTMENT_OTHER): Payer: Self-pay

## 2016-09-23 VITALS — BP 118/88 | HR 84 | Temp 98.4°F | Resp 18

## 2016-09-23 DIAGNOSIS — C787 Secondary malignant neoplasm of liver and intrahepatic bile duct: Secondary | ICD-10-CM

## 2016-09-23 DIAGNOSIS — C189 Malignant neoplasm of colon, unspecified: Secondary | ICD-10-CM

## 2016-09-23 MED ORDER — SODIUM CHLORIDE 0.9% FLUSH
10.0000 mL | INTRAVENOUS | Status: DC | PRN
  Administered 2016-09-23: 10 mL
  Filled 2016-09-23: qty 10

## 2016-09-23 MED ORDER — HEPARIN SOD (PORK) LOCK FLUSH 100 UNIT/ML IV SOLN
500.0000 [IU] | Freq: Once | INTRAVENOUS | Status: AC | PRN
  Administered 2016-09-23: 500 [IU]
  Filled 2016-09-23: qty 5

## 2016-10-04 NOTE — Progress Notes (Signed)
[facility]  [phone] [phone]  Clinic Follow Up Note  Patient Care Team: Curlene Labrum, MD as PCP - General (Family Medicine) 10/05/2016  CHIEF COMPLAINTS:  Follow up metastatic right colon cancer   Oncology History   Cancer Staging Metastatic colon cancer to liver [facility]) Staging form: Colon and Rectum, AJCC 8th Edition - Clinical stage from 06/01/2016: Stage IVA (cTX, cNX, pM1a) - Signed by Truitt Merle, MD on 07/04/2016 - Pathologic stage from 06/14/2016: Stage IVA (pT4b(m), pN2b, pM1a) - Signed by Truitt Merle, MD on 07/04/2016       Metastatic colon cancer to liver ([place])   04/2015 Procedure    Colonoscopy by Dr. Ladona Horns. It showed showed 2 sessile polyps ready between 3-5 mm in size located 20 cm (A, B) from the point of entry, polypectomy was performed. Pedunculated polyp was found in the ascending colon (C), polypectomy was performed, and additional polyp (D) was found 30 cm from the point of entry, removed      04/2015 Pathology Results    tubular adenoma (A and B), and well differentiated adenocarcinoma arising from tubulovillous adenoma (C) and well differentiated adenocarcinoma arising from severe dysplasia to intramucosal carcinoma within tubular adenoma.       04/2015 Initial Diagnosis    Metastatic colon cancer to liver ([place])      05/29/2016 Imaging    CT abdomen and pelvis with contrast showed an apple core like stricture in right colon just above the cecum, measuring 3.2 cm in lengths. This is highly suspicious for malignancy. Small lymph node a noticed he had adjacent mesentery, measuring 8 mm. There is a low-density lesion within the inferior aspect of the right hepatic lobe measuring 1.7 cm, suspicious for metastasis.       06/06/2016 Tumor Marker    CEA 9.99      06/08/2016 PET scan    IMPRESSION: Approximately 3 cm hypermetabolic mass in the ascending colon, consistent with primary colon carcinoma. This mass results in  colonic obstruction and small bowel dilatation. Additional areas of hypermetabolic wall thickening in the cecum may represent other sites of colon carcinoma or colitis. Mild hypermetabolic lymphadenopathy in right pericolonic region, porta hepatis, and aortocaval space, consistent with metastatic disease. Mild hypermetabolic mediastinal lymphadenopathy also seen, and thoracic lymph node metastases cannot be excluded. Solitary hypermetabolic focus in inferior right hepatic lobe, consistent with liver metastasis. Consider abdomen MRI without and with contrast for further evaluation.      06/14/2016 Surgery    Hand assisted right hemicolectomy and small bowel resection for colon cancer, liver biopsy, by Dr. Barry Dienes      06/14/2016 Pathology Results    Right hemicolectomy showed invasive well to moderately differentiated adenocarcinoma, 2 foci measuring 7.5 cm and 4.5 cm, tumor invades through full thickness of colon, to the seroma and involve the Small Bowel, Surgical Margins Were Negative, 24 Out Of 64 Lymph Nodes Were Positive, Extracapsular Extension Identified, Multiple Satellite Tumor Deposits Present, Liver Biopsy Showed Metastatic Adenocarcinoma.        06/14/2016 Miscellaneous    Tumor MMR normal, MSI stable       06/14/2016 Miscellaneous    Foundation one genomic testing showed K-ras G12 D mutation, APC and TP53 mutation. No BRAF and NRAS mutation. MSI-stable, tumor burden low.      07/06/2016 Tumor Marker    CEA 13.69      07/13/2016 -  Chemotherapy    mFOLFOX, every 2 weeks, started on 07/14/2015, Avastin added from  cycle 3  Oxaliplatin dose to 74m/m2 due to side effects and some cytopenia on 09/21/16  Changed to FOLFIRINOX starting cycle 7 and Reduced  Due to neuropathy hold Oxaliplatin and add Irinotecan with neulasta on day 3 starting with cycle 7 Add low dose Oxaliplatin with cycle 8.         07/27/2016 Tumor Marker    CEA 15.85      09/04/2016 Imaging    Ct C/A/P W  Contrast IMPRESSION: Interval right colectomy. Stable small liver metastasis in the inferior right hepatic lobe. Stable mild porta hepatis and aortocaval lymphadenopathy. Stable mild mediastinal lymphadenopathy. No new or progressive metastatic disease identified within the chest, abdomen, or pelvis.       HISTORY OF PRESENTING ILLNESS (06/01/2016):  Nicholas Raben89y.o. male is here because of his recently abdominal CT which is highly suspicious for metastatic colon cancer. He is accompanied by his wife to my clinic today. He was referred by his primary care physician Dr. BPleas Koch   He had colonoscopy in 2016 for mild rectal bleeding, he describe small amount fresh blood mixed with stool, he has no other constitutional symptoms at that time. He was referred to gastroenterologist Dr. CLadona Hornsand underwent a colonoscopy in December 2016. The colonoscopy showed 2 sessile polyps ready between 3-5 mm in size located 20 cm (A, B) from the point of entry, polypectomy was performed. Pedunculated polyp was found in the ascending colon (C), polypectomy was performed, and additional polyp (D) was found 30 cm from the point of entry, removed. The pathology reviewed tubular adenoma (A and B), and well differentiated adenocarcinoma arising from tubulovillous adenoma (C) and well differentiated adenocarcinoma arising from severe dysplasia to intramucosal carcinoma within tubular adenoma. Dr. CTye Marylandtried multiple times to reach patient, but patient sought they were calling him about the bill, and did not return the phone calls. He was not aware the cancer diagnosis until recently.   He started having diarrhea and vomiting in mid Dec 2017, and felt a "pop" in right side abdomen, he was seen at urgent care, and was treated with antiemetics, and IVF, lab tests were OK. Due to his persistent intermittent diarrhea and epigastric pain since then, he was seen by PCP and he eventually had CT abdomen and pelvis scan which  showed a upper core lesion in the ascending colon, and I'll 1.7 cm lesion in the liver, highly suspicious for metastasis. [place]  He has lost 30 lbs in the past one month, has low appetite, eats a small meals 1-2 times a day. He has moderate fatigue, able to tolerate routine activities including his work, but feels exhausted at the end of study. He has occasional constipation, denies recent rectal bleeding.  CURRENT THERAPY: mFOLFOX, every 2 weeks, started on 07/14/2015, Avastin added from cycle 3. Lower dose of Oxaliplatin 09/21/16.  Changed to FOLFIRINOX on 10/05/16, Hold Oxaliplatin cycle & and lower cycle 8. Add Irinotecan with  neulasta day 3 starting with cycle 7  INTERIM HISTORY:  CGerald Stabsreturns today for follow up and cycle 7 chemotherapy. He presents to the clinic today with his wife. His neuropathy has been worse and tingling in his lips and gums. He cannot taste anything and is concerned. Skin tag on left cheek has gotten darker. He wants to go to work so he would like to push back his 6/7 appointment to 6/14. He like to refill amlodipine 2 tablets daily.     .Marland Kitchen  MEDICAL HISTORY:  Past Medical History:  Diagnosis Date  . Anxiety   . Cancer of ascending colon ([place])   . Seasonal allergies     SURGICAL HISTORY: Past Surgical History:  Procedure Laterality Date  . APPENDECTOMY  1992  . CATARACT EXTRACTION W/ INTRAOCULAR LENS IMPLANT Left 04/2016  . COLON SURGERY    . COLONOSCOPY W/ BIOPSIES AND POLYPECTOMY  04/2015  . INGUINAL HERNIA REPAIR Right 1982  . KNEE ARTHROSCOPY W/ MENISCECTOMY Right 1998  . LAPAROSCOPIC RIGHT COLECTOMY N/A 06/14/2016   Procedure: LAPAROSCOPIC HAND ASSISTED HEMICOLECTOMY AND SMALL BOWEL RESECTION.;  Surgeon: [name], MD;  Location: [facility];  Service: General;  Laterality: N/A;  . LIVER BIOPSY Right 06/14/2016   Procedure: LIVER BIOPSY;  Surgeon: [name], MD;  Location: [facility];  Service: General;  Laterality: Right;   Right Inferior Liver  . PORTACATH PLACEMENT N/A 07/06/2016   Procedure: INSERTION PORT-A-CATH;  Surgeon: [name], MD;  Location: [facility];  Service: General;  Laterality: N/A;  . VASECTOMY  2005    SOCIAL HISTORY: Social History   Social History  . Marital status: Married    Spouse name: N/A  . Number of children: N/A  . Years of education: N/A   Occupational History  . Not on file.   Social History Main Topics  . Smoking status: Former Smoker    Years: 2.00    Types: Cigarettes    Quit date: 109  . Smokeless tobacco: Never Used  . Alcohol use 2.4 oz/week    4 Cans of beer per week     Comment: social, none since colon surgery  . Drug use: No     Comment: 06/15/2016 "nothing since college"  . Sexual activity: Yes   Other Topics Concern  . Not on file   Social History Narrative  . No narrative on file   He is married.  They have 3 boys, 27-12 yo. He works for a [employer], desk job.   FAMILY HISTORY: Family History  Problem Relation Age of Onset  . Cancer Mother        lung cancer  . Stroke Mother   . Hypertension Father   . CAD Father   . Cancer Maternal Grandfather        prostate cancer     ALLERGIES:  is allergic to penicillins.  MEDICATIONS:  Current Outpatient Prescriptions  Medication Sig Dispense Refill  . amLODipine (NORVASC) 5 MG tablet Take 2 tablets (10 mg total) by mouth daily. 60 tablet 0  . celecoxib (CELEBREX) 200 MG capsule Take 200 mg by mouth daily.  1  . diphenoxylate-atropine (LOMOTIL) 2.5-0.025 MG tablet TAKE TWO TABLETS BY MOUTH FOUR TIMES DAILY AS NEEDED FOR FOR DIARRHEA OR loose stools  0  . docusate sodium (COLACE) 100 MG capsule Take 100 mg by mouth daily.    Marland Kitchen FLUoxetine (PROZAC) 40 MG capsule Take 40 mg by mouth daily.    . IRON PO Take 1 tablet by mouth daily.    Marland Kitchen lidocaine-prilocaine (EMLA) cream Apply to affected area once 30 g 3  . lisinopril (PRINIVIL) 10 MG tablet Take 1 tablet (10 mg  total) by mouth daily. 30 tablet 1  . ondansetron (ZOFRAN ODT) 8 MG disintegrating tablet Take 1 tablet (8 mg total) by mouth every 8 (eight) hours as needed for nausea or vomiting. 30 tablet 2  . Probiotic Product (PROBIOTIC PO) Take 1 capsule by mouth daily.    . prochlorperazine (COMPAZINE)  10 MG tablet Take 1 tablet (10 mg total) by mouth every 6 (six) hours as needed for nausea or vomiting. 30 tablet 2  . promethazine (PHENERGAN) 25 MG tablet Take 25 mg by mouth every 6 (six) hours as needed. for nausea  1  . traMADol (ULTRAM) 50 MG tablet Take 1-2 tablets (50-100 mg total) by mouth every 6 (six) hours as needed for moderate pain or severe pain. 30 tablet 0   No current facility-administered medications for this visit.    Facility-Administered Medications Ordered in Other Visits  Medication Dose Route Frequency Provider Last Rate Last Dose  . clindamycin (CLEOCIN) 900 mg in dextrose 5 % 50 mL IVPB  900 mg Intravenous 60 min Pre-Op [name], MD       And  . gentamicin (GARAMYCIN) 450 mg in dextrose 5 % 50 mL IVPB  5 mg/kg Intravenous 60 min Pre-Op [name], MD        REVIEW OF SYSTEMS:  Constitutional: Denies fevers, chills or abnormal night sweats (+) tenderness around incision, (+) taste changes, metallic taste sensation Eyes: Denies blurriness of vision, double vision or watery eyes Ears, nose, mouth, throat, and face: Denies mucositis or sore throat (+) jaw pain and stiffness Respiratory: Denies cough, dyspnea or wheezes Cardiovascular: Denies palpitation, chest discomfort or lower extremity swelling Gastrointestinal:  Denies nausea, heartburn or change in bowel habits, Skin: (+) bilateral upper extremity rash (+) skin tag on left cheek  Lymphatics: Denies new lymphadenopathy or easy bruising Neurological: (+) neuropathy in fingers, lips and gums MSK: Muscle weakness in hands Behavioral/Psych: Mood is stable, no new changes  All other systems were reviewed with the  patient and are negative.  PHYSICAL EXAMINATION:  ECOG PERFORMANCE STATUS: 1 - Symptomatic but completely ambulatory  Vitals:   10/05/16 0911  BP: 125/81  Pulse: 64  Resp: 20  Temp: 97 F (36.1 C)  TempSrc: Oral  SpO2: 100%  Weight: 202 lb 14.4 oz (92 kg)  Height: _0  (1.803 m)   . GENERAL:alert, no distress and comfortable. SKIN: skin color, texture, turgor are normal, no rashes or significant lesions EYES: normal, conjunctiva are pink and non-injected, sclera clear OROPHARYNX:no exudate, no erythema and lips, buccal mucosa, and tongue normal  NECK: supple, thyroid normal size, non-tender, without nodularity LYMPH:  no palpable lymphadenopathy in the cervical, axillary or inguinal LUNGS: clear to auscultation and percussion with normal breathing effort HEART: regular rate & rhythm and no murmurs and no lower extremity edema ABDOMEN:abdomen soft, Surgical scar in the midline around the umbilical has healed well. non-tender and normal bowel sounds Musculoskeletal:no cyanosis of digits and no clubbing  PSYCH: alert & oriented x 3 with fluent speech NEURO: no focal motor/sensory deficits  LABORATORY DATA:  I have reviewed the data as listed CBC Latest Ref Rng & Units 10/05/2016 09/21/2016 09/07/2016  WBC 4.0 - 10.3 10e3/uL 3.4(L) 3.2(L) 3.3(L)  Hemoglobin 13.0 - 17.1 g/dL 12.1(L) 11.3(L) 12.0(L)  Hematocrit 38.4 - 49.9 % 35.6(L) 34.4(L) 35.8(L)  Platelets 140 - 400 10e3/uL 116(L) 98(L) 116(L)   CMP Latest Ref Rng & Units 10/05/2016 09/21/2016 09/07/2016  Glucose 70 - 140 mg/dl 118 83 76  BUN 7.0 - 26.0 mg/dL 14.8 18.4 24.5  Creatinine 0.7 - 1.3 mg/dL 1.1 1.0 1.1  Sodium 136 - 145 mEq/L 140 141 141  Potassium 3.5 - 5.1 mEq/L 4.4 4.4 4.8  Chloride 101 - 111 mmol/L - - -  CO2 22 - 29 mEq/L _1 Calcium 8.4 -  10.4 mg/dL 9.2 9.2 9.5  Total Protein 6.4 - 8.3 g/dL 6.9 6.7 7.1  Total Bilirubin 0.20 - 1.20 mg/dL 0.60 0.35 0.39  Alkaline Phos 40 - 150 U/L 52 49 55  AST 5 - 34  U/L _0 ALT 0 - 55 U/L _1 Surgical path   Diagnosis 06/14/2016 1. Colon, segmental resection for tumor, Right Ascending Hemicolectomy and Small Bowel - INVASIVE WELL TO MODERATELY DIFFERENTIATED ADENOCARCINOMA. - TWO TUMOR FOCI MEASURING 7.5 CM AND 4.5 CM IN GREATEST DIMENSION. - TUMOR INVADES THROUGH FULL THICKNESS OF COLON, THROUGH THE SEROSA TO INVOLVE THE SMALL BOWEL. - MARGINS ARE NEGATIVE. - TWENTY FOUR OF SIXTY FOUR LYMPH NODES POSITIVE FOR METASTATIC ADENOCARCINOMA (24/64). - EXTRACAPSULAR EXTENSION IDENTIFIED - MULTIPLE SATELLITE TUMOR DEPOSITS PRESENT. - SEE ONCOLOGY TEMPLATE. 2. Liver, needle/core biopsy, Right Inferior - POSITIVE FOR METASTATIC ADENOCARCINOMA.    RADIOGRAPHIC STUDIES: I have personally reviewed his outside CT scan from 05/29/2016 and agreed with the findings in the report. No results found.  CT CAP 09/04/16 IMPRESSION: Interval right colectomy. Stable small liver metastasis in the inferior right hepatic lobe. Stable mild porta hepatis and aortocaval lymphadenopathy. Stable mild mediastinal lymphadenopathy. No new or progressive metastatic disease identified within the chest, abdomen, or pelvis.  ASSESSMENT & PLAN:  56 y.o. Caucasian male, without significant past medical history, presented with rectal bleeding in December 2016, colonoscopy showed 4 polyps, 2 of them showed invasive adenocarcinoma, unfortunately he was not aware of the pathology findings and was not treated. He now presented with fatigue, weight loss, anorexia and abdominal pain  1. Right colon cancer with liver and node metastasis, [id number], stage IV, MSI-stable  -I previously reviewed her PET scan findings, which showed solitary liver metastasis, abdominal and possible thoracic node metastasis  -His liver biopsy confirmed metastasis -Due to the biopsy action, he underwent upfront hemicolectomy. I previously reviewed his surgical pathology findings, which showed 2  primary right colon cancer, very locally advanced with 24 lymph nodes positive, surgical margins were negative. -We previously reviewed the patient's CEA from 06/06/16 which was 9.99. -I discussed his Foundation one genomic testing results, which showed care arrest mutation, and as high stable, low tumor burden. So he would not benefit from EGFR inhibitor, or immunotherapy alone. -The patient has started first-line chemotherapy FOLFOX. Avastin was added from cycle 3 -The goal for chemotherapy is palliative, giving his metastatic disease. -He has solitary liver metastasis, but multiple mild hypermetabolic adenopathy in mediastinum and abdomen. We previously discussed the possible local therapy options, such as liver metastectomy or ablation if he has great response to chemo and his node metastasis resolves  -restaging CT from 09/04/2016 reviewed with pt, stable disease. Will continue chemo -he has developed more side effects from chemo FOLFOX, and a worsening thrombocytopenia, despite reduced dose of oxaliplatin  -Due to his overall limited response to FOLFOX, and his wish to pursue surgery, I previously recommend him to change chemo to FOLFIRINOX with low dose oxaliplatin, hopefully he will response better. Potential side effects and benefit of adding irinotecan were discussed with patient, he agrees to proceed.  -due to his worsening neuropathy and persistent thrombocytopenia, I'll change chemotherapy to FOLFIRI today, may consider add oxaliplatin back if he can tolerate -I will repeat PET scan in 3 months for restaging purposes. He may be a candidate for liver surgery or ablation if he has excellent response to chemo and nodes become negative on next PET.  2. Hypertension -His blood  pressure has been high lately, I have previously given him clonidine 0.1 mg in the infusion room before Avastin -We previously discussed Avastin can cause hypertension -I have started him on amlodipine, and increase dose  to 10 million daily -He does not want to follow-up with his primary care physician. -His blood picture remains to be high, especially diastolic blood pressure. I added on lisinopril 10 mg daily for him -Change Amlodipine prescription to 36m   3. Anemia of iron deficiency  -He has mild anemia, likely related to his colon cancer bleeding. -His HGB was low following surgery. I have order a repeat lab. -I previously recommend him to take oral iron supplement over-the-counter, potential side effects of constipation and gastric or discomfort or discussed with him. He stopped taking this before surgery. -I previously advised him to begin taking oral iron supplements again. - Getting better ,11.3 previously. - Iron levels previously on 08/24/16 at 46. - We will continue to monitor iron levels   4. Anorexia and weight loss -Secondary to underlying malignancy -I previously encouraged him to try nutritional supplement, such as boost or initial -The patient has previously gained some weight .  5. Peripheral neuropathy, G2 -Secondary to oxaliplatin, we'll hold it today -Continue observation  6. Goal of care discussion  -We previously discussed the incurable nature of his cancer, and the overall poor prognosis, especially if he does not have good response to chemotherapy or progress on chemo -The patient understands the goal of care is palliative.  -He is full code now   7. Skin tag on left cheek -Darkened due to side effect of chemo -He can get it removed but when blood counts are normal  -I encouraged him to be cautious of skin discoloration and sunscreen   Plan  -Refill Amlodipine 10 mg  -Hold Oxaliplatin and will proceed FOLFIRI today with neulasta on day 3 -postpone next cycle chemo for one week due to pt's request  -Lab, flush, f/u and chemo FOLRFIRINOX on 6/14 and 6/28   No orders of the defined types were placed in this encounter.   All questions were answered.   The patient  knows to call the clinic with any problems, questions or concerns. I spent 25 minutes counseling the patient face to face. The total time spent in the appointment was 30 minutes and more than 50% was on counseling.  This document serves as a record of services personally performed by YTruitt Merle MD. It was created on her behalf by AJoslyn Devon a trained medical scribe. The creation of this record is based on the scribe's personal observations and the provider's statements to them. This document has been checked and approved by the attending provider. .Truitt Merle MD 10/05/2016

## 2016-10-05 ENCOUNTER — Ambulatory Visit (HOSPITAL_BASED_OUTPATIENT_CLINIC_OR_DEPARTMENT_OTHER): Payer: BLUE CROSS/BLUE SHIELD | Admitting: Hematology

## 2016-10-05 ENCOUNTER — Telehealth: Payer: Self-pay | Admitting: Hematology

## 2016-10-05 ENCOUNTER — Other Ambulatory Visit (HOSPITAL_BASED_OUTPATIENT_CLINIC_OR_DEPARTMENT_OTHER): Payer: BLUE CROSS/BLUE SHIELD

## 2016-10-05 ENCOUNTER — Ambulatory Visit (HOSPITAL_BASED_OUTPATIENT_CLINIC_OR_DEPARTMENT_OTHER): Payer: BLUE CROSS/BLUE SHIELD

## 2016-10-05 ENCOUNTER — Encounter: Payer: Self-pay | Admitting: Hematology

## 2016-10-05 VITALS — BP 125/81 | HR 64 | Temp 97.0°F | Resp 20 | Ht 71.0 in | Wt 202.9 lb

## 2016-10-05 DIAGNOSIS — G620 Drug-induced polyneuropathy: Secondary | ICD-10-CM

## 2016-10-05 DIAGNOSIS — C787 Secondary malignant neoplasm of liver and intrahepatic bile duct: Secondary | ICD-10-CM

## 2016-10-05 DIAGNOSIS — C182 Malignant neoplasm of ascending colon: Secondary | ICD-10-CM

## 2016-10-05 DIAGNOSIS — C189 Malignant neoplasm of colon, unspecified: Secondary | ICD-10-CM

## 2016-10-05 DIAGNOSIS — D509 Iron deficiency anemia, unspecified: Secondary | ICD-10-CM | POA: Diagnosis not present

## 2016-10-05 DIAGNOSIS — D500 Iron deficiency anemia secondary to blood loss (chronic): Secondary | ICD-10-CM

## 2016-10-05 DIAGNOSIS — Z452 Encounter for adjustment and management of vascular access device: Secondary | ICD-10-CM

## 2016-10-05 DIAGNOSIS — Z5112 Encounter for antineoplastic immunotherapy: Secondary | ICD-10-CM | POA: Diagnosis not present

## 2016-10-05 DIAGNOSIS — I10 Essential (primary) hypertension: Secondary | ICD-10-CM

## 2016-10-05 DIAGNOSIS — Z5111 Encounter for antineoplastic chemotherapy: Secondary | ICD-10-CM

## 2016-10-05 DIAGNOSIS — Z95828 Presence of other vascular implants and grafts: Secondary | ICD-10-CM

## 2016-10-05 LAB — COMPREHENSIVE METABOLIC PANEL
ALT: 21 U/L (ref 0–55)
ANION GAP: 10 meq/L (ref 3–11)
AST: 18 U/L (ref 5–34)
Albumin: 3.9 g/dL (ref 3.5–5.0)
Alkaline Phosphatase: 52 U/L (ref 40–150)
BUN: 14.8 mg/dL (ref 7.0–26.0)
CALCIUM: 9.2 mg/dL (ref 8.4–10.4)
CO2: 24 meq/L (ref 22–29)
Chloride: 106 mEq/L (ref 98–109)
Creatinine: 1.1 mg/dL (ref 0.7–1.3)
EGFR: 74 mL/min/{1.73_m2} — AB (ref >90)
Glucose: 118 mg/dl (ref 70–140)
Potassium: 4.4 mEq/L (ref 3.5–5.1)
Sodium: 140 mEq/L (ref 136–145)
Total Bilirubin: 0.6 mg/dL (ref 0.20–1.20)
Total Protein: 6.9 g/dL (ref 6.4–8.3)

## 2016-10-05 LAB — CBC WITH DIFFERENTIAL/PLATELET
BASO%: 0.8 % (ref 0.0–2.0)
Basophils Absolute: 0 10*3/uL (ref 0.0–0.1)
EOS ABS: 0.1 10*3/uL (ref 0.0–0.5)
EOS%: 2.1 % (ref 0.0–7.0)
HCT: 35.6 % — ABNORMAL LOW (ref 38.4–49.9)
HEMOGLOBIN: 12.1 g/dL — AB (ref 13.0–17.1)
LYMPH#: 0.6 10*3/uL — AB (ref 0.9–3.3)
LYMPH%: 16.8 % (ref 14.0–49.0)
MCH: 30.2 pg (ref 27.2–33.4)
MCHC: 33.9 g/dL (ref 32.0–36.0)
MCV: 89.1 fL (ref 79.3–98.0)
MONO#: 0.5 10*3/uL (ref 0.1–0.9)
MONO%: 14.5 % — ABNORMAL HIGH (ref 0.0–14.0)
NEUT#: 2.2 10*3/uL (ref 1.5–6.5)
NEUT%: 65.8 % (ref 39.0–75.0)
PLATELETS: 116 10*3/uL — AB (ref 140–400)
RBC: 4 10*6/uL — ABNORMAL LOW (ref 4.20–5.82)
RDW: 18.8 % — AB (ref 11.0–14.6)
WBC: 3.4 10*3/uL — ABNORMAL LOW (ref 4.0–10.3)

## 2016-10-05 MED ORDER — SODIUM CHLORIDE 0.9% FLUSH
10.0000 mL | INTRAVENOUS | Status: DC | PRN
  Filled 2016-10-05: qty 10

## 2016-10-05 MED ORDER — HEPARIN SOD (PORK) LOCK FLUSH 100 UNIT/ML IV SOLN
500.0000 [IU] | Freq: Once | INTRAVENOUS | Status: DC | PRN
  Filled 2016-10-05: qty 5

## 2016-10-05 MED ORDER — FLUOROURACIL CHEMO INJECTION 5 GM/100ML
2390.0000 mg/m2 | INTRAVENOUS | Status: DC
  Administered 2016-10-05: 5000 mg via INTRAVENOUS
  Filled 2016-10-05: qty 100

## 2016-10-05 MED ORDER — PALONOSETRON HCL INJECTION 0.25 MG/5ML
INTRAVENOUS | Status: AC
  Filled 2016-10-05: qty 5

## 2016-10-05 MED ORDER — ATROPINE SULFATE 1 MG/ML IJ SOLN
INTRAMUSCULAR | Status: AC
  Filled 2016-10-05: qty 1

## 2016-10-05 MED ORDER — PALONOSETRON HCL INJECTION 0.25 MG/5ML
0.2500 mg | Freq: Once | INTRAVENOUS | Status: AC
  Administered 2016-10-05: 0.25 mg via INTRAVENOUS

## 2016-10-05 MED ORDER — AMLODIPINE BESYLATE 10 MG PO TABS: 10.0000 mg | ORAL_TABLET | Freq: Every day | ORAL | 2 refills | Status: DC

## 2016-10-05 MED ORDER — SODIUM CHLORIDE 0.9 % IV SOLN
5.0000 mg/kg | Freq: Once | INTRAVENOUS | Status: AC
  Administered 2016-10-05: 450 mg via INTRAVENOUS
  Filled 2016-10-05: qty 16

## 2016-10-05 MED ORDER — LEUCOVORIN CALCIUM INJECTION 350 MG
400.0000 mg/m2 | Freq: Once | INTRAVENOUS | Status: AC
  Administered 2016-10-05: 840 mg via INTRAVENOUS
  Filled 2016-10-05: qty 42

## 2016-10-05 MED ORDER — DEXAMETHASONE SODIUM PHOSPHATE 10 MG/ML IJ SOLN
INTRAMUSCULAR | Status: AC
  Filled 2016-10-05: qty 1

## 2016-10-05 MED ORDER — SODIUM CHLORIDE 0.9% FLUSH
10.0000 mL | Freq: Once | INTRAVENOUS | Status: AC
  Administered 2016-10-05: 10 mL
  Filled 2016-10-05: qty 10

## 2016-10-05 MED ORDER — DEXTROSE 5 % IV SOLN: Freq: Once | INTRAVENOUS | Status: DC

## 2016-10-05 MED ORDER — IRINOTECAN HCL CHEMO INJECTION 100 MG/5ML
180.0000 mg/m2 | Freq: Once | INTRAVENOUS | Status: AC
  Administered 2016-10-05: 380 mg via INTRAVENOUS
  Filled 2016-10-05: qty 15

## 2016-10-05 MED ORDER — DEXAMETHASONE SODIUM PHOSPHATE 10 MG/ML IJ SOLN
10.0000 mg | Freq: Once | INTRAMUSCULAR | Status: AC
  Administered 2016-10-05: 10 mg via INTRAVENOUS

## 2016-10-05 NOTE — Telephone Encounter (Signed)
06/07 and 06/09 appointments cancelled per 10/05/16 los. Lab, flush, FOLFORINOX and follow up scheduled for 06/1/ and 06/28, with PUMP D/C on 06/16/ and 06/30, per 10/05/16 los. Patient was given a copy of the AVS report and appointment schedule,per 10/05/16 los.

## 2016-10-05 NOTE — Patient Instructions (Signed)
[facility] Discharge Instructions for Patients Receiving Chemotherapy  Today you received the following chemotherapy agents Avastin, Leucovorin and Adrucil  To help prevent nausea and vomiting after your treatment, we encourage you to take your nausea medication as directed.    If you develop nausea and vomiting that is not controlled by your nausea medication, call the clinic.   BELOW ARE SYMPTOMS THAT SHOULD BE REPORTED IMMEDIATELY:  *FEVER GREATER THAN 100.5 F  *CHILLS WITH OR WITHOUT FEVER  NAUSEA AND VOMITING THAT IS NOT CONTROLLED WITH YOUR NAUSEA MEDICATION  *UNUSUAL SHORTNESS OF BREATH  *UNUSUAL BRUISING OR BLEEDING  TENDERNESS IN MOUTH AND THROAT WITH OR WITHOUT PRESENCE OF ULCERS  *URINARY PROBLEMS  *BOWEL PROBLEMS  UNUSUAL RASH Items with * indicate a potential emergency and should be followed up as soon as possible.  Feel free to call the clinic you have any questions or concerns. The clinic phone number is [phone].  Please show the CHEMO ALERT CARD at check-in to the Emergency Department and triage nurse.   

## 2016-10-07 ENCOUNTER — Encounter: Payer: Self-pay | Admitting: Hematology

## 2016-10-07 ENCOUNTER — Ambulatory Visit (HOSPITAL_BASED_OUTPATIENT_CLINIC_OR_DEPARTMENT_OTHER): Payer: BLUE CROSS/BLUE SHIELD

## 2016-10-07 VITALS — BP 126/89 | HR 102 | Temp 97.6°F | Resp 18

## 2016-10-07 DIAGNOSIS — C787 Secondary malignant neoplasm of liver and intrahepatic bile duct: Principal | ICD-10-CM

## 2016-10-07 DIAGNOSIS — C182 Malignant neoplasm of ascending colon: Secondary | ICD-10-CM

## 2016-10-07 DIAGNOSIS — Z5189 Encounter for other specified aftercare: Secondary | ICD-10-CM

## 2016-10-07 DIAGNOSIS — C189 Malignant neoplasm of colon, unspecified: Secondary | ICD-10-CM

## 2016-10-07 MED ORDER — SODIUM CHLORIDE 0.9% FLUSH
10.0000 mL | INTRAVENOUS | Status: DC | PRN
  Administered 2016-10-07: 10 mL
  Filled 2016-10-07: qty 10

## 2016-10-07 MED ORDER — PEGFILGRASTIM INJECTION 6 MG/0.6ML ~~LOC~~
6.0000 mg | PREFILLED_SYRINGE | Freq: Once | SUBCUTANEOUS | Status: AC
  Administered 2016-10-07: 6 mg via SUBCUTANEOUS

## 2016-10-07 MED ORDER — HEPARIN SOD (PORK) LOCK FLUSH 100 UNIT/ML IV SOLN
500.0000 [IU] | Freq: Once | INTRAVENOUS | Status: AC | PRN
  Administered 2016-10-07: 500 [IU]
  Filled 2016-10-07: qty 5

## 2016-10-07 NOTE — Patient Instructions (Signed)
Pegfilgrastim injection What is this medicine? PEGFILGRASTIM (PEG fil gra stim) is a long-acting granulocyte colony-stimulating factor that stimulates the growth of neutrophils, a type of white blood cell important in the body's fight against infection. It is used to reduce the incidence of fever and infection in patients with certain types of cancer who are receiving chemotherapy that affects the bone marrow, and to increase survival after being exposed to high doses of radiation. This medicine may be used for other purposes; ask your health care provider or pharmacist if you have questions. COMMON BRAND NAME(S): Neulasta What should I tell my health care provider before I take this medicine? They need to know if you have any of these conditions: -kidney disease -latex allergy -ongoing radiation therapy -sickle cell disease -skin reactions to acrylic adhesives (On-Body Injector only) -an unusual or allergic reaction to pegfilgrastim, filgrastim, other medicines, foods, dyes, or preservatives -pregnant or trying to get pregnant -breast-feeding How should I use this medicine? This medicine is for injection under the skin. If you get this medicine at home, you will be taught how to prepare and give the pre-filled syringe or how to use the On-body Injector. Refer to the patient Instructions for Use for detailed instructions. Use exactly as directed. Tell your healthcare provider immediately if you suspect that the On-body Injector may not have performed as intended or if you suspect the use of the On-body Injector resulted in a missed or partial dose. It is important that you put your used needles and syringes in a special sharps container. Do not put them in a trash can. If you do not have a sharps container, call your pharmacist or healthcare provider to get one. Talk to your pediatrician regarding the use of this medicine in children. While this drug may be prescribed for selected conditions,  precautions do apply. Overdosage: If you think you have taken too much of this medicine contact a poison control center or emergency room at once. NOTE: This medicine is only for you. Do not share this medicine with others. What if I miss a dose? It is important not to miss your dose. Call your doctor or health care professional if you miss your dose. If you miss a dose due to an On-body Injector failure or leakage, a new dose should be administered as soon as possible using a single prefilled syringe for manual use. What may interact with this medicine? Interactions have not been studied. Give your health care provider a list of all the medicines, herbs, non-prescription drugs, or dietary supplements you use. Also tell them if you smoke, drink alcohol, or use illegal drugs. Some items may interact with your medicine. This list may not describe all possible interactions. Give your health care provider a list of all the medicines, herbs, non-prescription drugs, or dietary supplements you use. Also tell them if you smoke, drink alcohol, or use illegal drugs. Some items may interact with your medicine. What should I watch for while using this medicine? You may need blood work done while you are taking this medicine. If you are going to need a MRI, CT scan, or other procedure, tell your doctor that you are using this medicine (On-Body Injector only). What side effects may I notice from receiving this medicine? Side effects that you should report to your doctor or health care professional as soon as possible: -allergic reactions like skin rash, itching or hives, swelling of the face, lips, or tongue -dizziness -fever -pain, redness, or irritation at site   where injected -pinpoint red spots on the skin -red or dark-brown urine -shortness of breath or breathing problems -stomach or side pain, or pain at the shoulder -swelling -tiredness -trouble passing urine or change in the amount of urine Side  effects that usually do not require medical attention (report to your doctor or health care professional if they continue or are bothersome): -bone pain -muscle pain This list may not describe all possible side effects. Call your doctor for medical advice about side effects. You may report side effects to FDA at 1-800-FDA-1088. Where should I keep my medicine? Keep out of the reach of children. Store pre-filled syringes in a refrigerator between 2 and 8 degrees C (36 and 46 degrees F). Do not freeze. Keep in carton to protect from light. Throw away this medicine if it is left out of the refrigerator for more than 48 hours. Throw away any unused medicine after the expiration date. NOTE: This sheet is a summary. It may not cover all possible information. If you have questions about this medicine, talk to your doctor, pharmacist, or health care provider.  2018 [employer] (2016-04-27 12:58:03)  

## 2016-10-16 ENCOUNTER — Other Ambulatory Visit: Payer: Self-pay | Admitting: Hematology

## 2016-10-18 NOTE — Progress Notes (Signed)
[facility]  Telephone:([phone] Fax:([phone]) [phone]  Clinic Follow Up Note  Patient Care Team: Juliette Alcide, MD as PCP - General (Family Medicine) 10/26/2016  CHIEF COMPLAINTS:  Follow up metastatic right colon cancer   Oncology History   Cancer Staging Metastatic colon cancer to liver [facility]) Staging form: Colon and Rectum, AJCC 8th Edition - Clinical stage from 06/01/2016: Stage IVA (cTX, cNX, pM1a) - Signed by Malachy Mood, MD on 07/04/2016 - Pathologic stage from 06/14/2016: Stage IVA (pT4b(m), pN2b, pM1a) - Signed by Malachy Mood, MD on 07/04/2016       Metastatic colon cancer to liver (HCC)   04/2015 Procedure    Colonoscopy by Dr. Marcha Solders. It showed showed 2 sessile polyps ready between 3-5 mm in size located 20 cm (A, B) from the point of entry, polypectomy was performed. Pedunculated polyp was found in the ascending colon (C), polypectomy was performed, and additional polyp (D) was found 30 cm from the point of entry, removed      04/2015 Pathology Results    tubular adenoma (A and B), and well differentiated adenocarcinoma arising from tubulovillous adenoma (C) and well differentiated adenocarcinoma arising from severe dysplasia to intramucosal carcinoma within tubular adenoma.       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 This is highly suspicious for malignancy. Small lymph node a noticed he had adjacent mesentery, measuring 8 mm. There is a low-density lesion within the inferior aspect of the right hepatic lobe measuring 1.7 cm, suspicious for metastasis.       06/06/2016 Tumor Marker    CEA 9.99      06/08/2016 PET scan    IMPRESSION: Approximately 3 cm hypermetabolic mass in the ascending colon, consistent with primary colon carcinoma. This mass results in  colonic obstruction and small bowel dilatation. Additional areas of hypermetabolic wall thickening in the cecum may represent other sites of colon carcinoma or colitis. Mild hypermetabolic lymphadenopathy in right pericolonic region, porta hepatis, and aortocaval space, consistent with metastatic disease. Mild hypermetabolic mediastinal lymphadenopathy also seen, and thoracic lymph node metastases cannot be excluded. Solitary hypermetabolic focus in inferior right hepatic lobe, consistent with liver metastasis. Consider abdomen MRI without and with contrast for further evaluation.      06/14/2016 Surgery    Hand assisted right hemicolectomy and small bowel resection for colon cancer, liver biopsy, by Dr. Donell Beers      06/14/2016 Pathology Results    Right hemicolectomy showed invasive well to moderately differentiated adenocarcinoma, 2 foci measuring 7.5 cm and 4.5 cm, tumor invades through full thickness of colon, to the seroma and involve the Small Bowel, Surgical Margins Were Negative, 24 Out Of 64 Lymph Nodes Were Positive, Extracapsular Extension Identified, Multiple Satellite Tumor Deposits Present, Liver Biopsy Showed Metastatic Adenocarcinoma.        06/14/2016 Miscellaneous    Tumor MMR normal, MSI stable       06/14/2016 Miscellaneous    Foundation one genomic testing showed K-ras G12 D mutation, APC and TP53 mutation. No BRAF and NRAS mutation. MSI-stable, tumor burden low.      07/06/2016 Tumor Marker    CEA 13.69      07/13/2016 -  Chemotherapy    mFOLFOX, every 2 weeks, started on 07/14/2015, Avastin added from  cycle 3  Oxaliplatin dose to '60mg'$ /m2 due to side effects and some cytopenia on 09/21/16  Changed to FOLFIRINOX starting cycle 7 and Reduced  Due to neuropathy hold Oxaliplatin and add Irinotecan with neulasta on day 3 starting with cycle 7 Add low dose Oxaliplatin with cycle 8.         07/27/2016 Tumor Marker    CEA 15.85      09/04/2016 Imaging    Ct C/A/P W  Contrast IMPRESSION: Interval right colectomy. Stable small liver metastasis in the inferior right hepatic lobe. Stable mild porta hepatis and aortocaval lymphadenopathy. Stable mild mediastinal lymphadenopathy. No new or progressive metastatic disease identified within the chest, abdomen, or pelvis.       HISTORY OF PRESENTING ILLNESS (06/01/2016):  Nicholas Lowery 56 y.o. male is here because of his recently abdominal CT which is highly suspicious for metastatic colon cancer. He is accompanied by his wife to my clinic today. He was referred by his primary care physician Dr. Pleas Koch.   He had colonoscopy in 2016 for mild rectal bleeding, he describe small amount fresh blood mixed with stool, he has no other constitutional symptoms at that time. He was referred to gastroenterologist Dr. Ladona Horns and underwent a colonoscopy in December 2016. The colonoscopy showed 2 sessile polyps ready between 3-5 mm in size located 20 cm (A, B) from the point of entry, polypectomy was performed. Pedunculated polyp was found in the ascending colon (C), polypectomy was performed, and additional polyp (D) was found 30 cm from the point of entry, removed. The pathology reviewed tubular adenoma (A and B), and well differentiated adenocarcinoma arising from tubulovillous adenoma (C) and well differentiated adenocarcinoma arising from severe dysplasia to intramucosal carcinoma within tubular adenoma. Dr. Tye Maryland tried multiple times to reach patient, but patient sought they were calling him about the bill, and did not return the phone calls. He was not aware the cancer diagnosis until recently.   He started having diarrhea and vomiting in mid Dec 2017, and felt a "pop" in right side abdomen, he was seen at urgent care, and was treated with antiemetics, and IVF, lab tests were OK. Due to his persistent intermittent diarrhea and epigastric pain since then, he was seen by PCP and he eventually had CT abdomen and pelvis scan which  showed a upper core lesion in the ascending colon, and I'll 1.7 cm lesion in the liver, highly suspicious for metastasis. He was referred to [place] for further evaluation.  He has lost 30 lbs in the past one month, has low appetite, eats a small meals 1-2 times a day. He has moderate fatigue, able to tolerate routine activities including his work, but feels exhausted at the end of study. He has occasional constipation, denies recent rectal bleeding.  CURRENT THERAPY: mFOLFOX, every 2 weeks, started on 07/14/2015, Avastin added from cycle 3. Lower dose of Oxaliplatin 09/21/16.  Changed to FOLFIRINOX on 10/05/16   INTERIM HISTORY:  [name] returns today for follow up. He presents to the clinic today doing well after his trip. He reports to return to working out and eating well. He had no problems with his throat. He had no new reactions. He had some loose stools but no diarrhea. His numbness and tingling has improved. He did have cramps when he was on irinotecan  that resulted in nausea. He had no issues with neulasta shot.   Personally, Of the week of July 14th he will go to [place], [place] for a week. Also he has  a seminar the last week of July.    Marland Kitchen  MEDICAL HISTORY:  Past Medical History:  Diagnosis Date   Anxiety    Cancer of ascending colon ([place])    Seasonal allergies     SURGICAL HISTORY: Past Surgical History:  Procedure Laterality Date   APPENDECTOMY  1992   CATARACT EXTRACTION W/ INTRAOCULAR LENS IMPLANT Left 04/2016   COLON SURGERY     COLONOSCOPY W/ BIOPSIES AND POLYPECTOMY  04/2015   INGUINAL HERNIA REPAIR Right 1982   KNEE ARTHROSCOPY W/ MENISCECTOMY Right 1998   LAPAROSCOPIC RIGHT COLECTOMY N/A 06/14/2016   Procedure: LAPAROSCOPIC HAND ASSISTED HEMICOLECTOMY AND SMALL BOWEL RESECTION.;  Surgeon: [name], MD;  Location: [place];  Service: General;  Laterality: N/A;   LIVER BIOPSY Right 06/14/2016   Procedure: LIVER BIOPSY;  Surgeon: [name], MD;  Location: [place];   Service: General;  Laterality: Right;  Right Inferior Liver   PORTACATH PLACEMENT N/A 07/06/2016   Procedure: INSERTION PORT-A-CATH;  Surgeon: [name], MD;  Location: [facility];  Service: General;  Laterality: N/A;   VASECTOMY  2005    SOCIAL HISTORY: Social History   Social History   Marital status: Married    Spouse name: N/A   Number of children: N/A   Years of education: N/A   Occupational History   Not on file.   Social History Main Topics   Smoking status: Former Smoker    Years: 2.00    Types: Cigarettes    Quit date: 1990   Smokeless tobacco: Never Used   Alcohol use 2.4 oz/week    4 Cans of beer per week     Comment: social, none since colon surgery   Drug use: No     Comment: 06/15/2016 "nothing since college"   Sexual activity: Yes   Other Topics Concern   Not on file   Social History Narrative   No narrative on file   He is married.  They have 3 boys, 34-12 yo. He works for a [employer], desk job.   FAMILY HISTORY: Family History  Problem Relation Age of Onset   Cancer Mother        lung cancer   Stroke Mother    Hypertension Father    CAD Father    Cancer Maternal Grandfather        prostate cancer     ALLERGIES:  is allergic to penicillins.  MEDICATIONS:  Current Outpatient Prescriptions  Medication Sig Dispense Refill   amLODipine (NORVASC) 10 MG tablet Take 1 tablet (10 mg total) by mouth daily. 30 tablet 2   celecoxib (CELEBREX) 200 MG capsule Take 200 mg by mouth daily.  1   docusate sodium (COLACE) 100 MG capsule Take 100 mg by mouth daily.     FLUoxetine (PROZAC) 40 MG capsule Take 40 mg by mouth daily.     IRON PO Take 1 tablet by mouth daily.     lidocaine-prilocaine (EMLA) cream Apply to affected area once 30 g 3   lisinopril (PRINIVIL) 10 MG tablet Take 1 tablet (10 mg total) by mouth daily. 30 tablet 1   ondansetron (ZOFRAN ODT) 8 MG disintegrating tablet Take 1 tablet (8 mg  total) by mouth every 8 (eight) hours as needed for nausea or vomiting. 30 tablet 2   Probiotic Product (PROBIOTIC PO) Take 1 capsule by mouth daily.     prochlorperazine (COMPAZINE) 10 MG tablet Take 1 tablet (10 mg total) by mouth every 6 (  six) hours as needed for nausea or vomiting. 30 tablet 2   promethazine (PHENERGAN) 25 MG tablet Take 25 mg by mouth every 6 (six) hours as needed. for nausea  1   traMADol (ULTRAM) 50 MG tablet Take 1-2 tablets (50-100 mg total) by mouth every 6 (six) hours as needed for moderate pain or severe pain. 30 tablet 0   diphenoxylate-atropine (LOMOTIL) 2.5-0.025 MG tablet TAKE TWO TABLETS BY MOUTH FOUR TIMES DAILY AS NEEDED FOR FOR DIARRHEA OR loose stools  0   No current facility-administered medications for this visit.    Facility-Administered Medications Ordered in Other Visits  Medication Dose Route Frequency Provider Last Rate Last Dose   clindamycin (CLEOCIN) 900 mg in dextrose 5 % 50 mL IVPB  900 mg Intravenous 60 min Pre-Op [name], MD       And   gentamicin (GARAMYCIN) 450 mg in dextrose 5 % 50 mL IVPB  5 mg/kg Intravenous 60 min Pre-Op [name], MD        REVIEW OF SYSTEMS:  Constitutional: Denies fevers, chills or abnormal night sweats (+) tenderness around incision, (+) taste changes, metallic taste sensation Eyes: Denies blurriness of vision, double vision or watery eyes Ears, nose, mouth, throat, and face: Denies mucositis or sore throat (+) jaw pain and stiffness Respiratory: Denies cough, dyspnea or wheezes Cardiovascular: Denies palpitation, chest discomfort or lower extremity swelling Gastrointestinal:  Denies nausea, heartburn or change in bowel habits, Skin: (+) bilateral upper extremity rash (+) skin tag on left cheek  Lymphatics: Denies new lymphadenopathy or easy bruising Neurological: (+) neuropathy in fingers, improved  MSK: Muscle weakness in hands Behavioral/Psych: Mood is stable, no new changes  All other  systems were reviewed with the patient and are negative.  PHYSICAL EXAMINATION:  ECOG PERFORMANCE STATUS: 1 - Symptomatic but completely ambulatory  Vitals:   10/26/16 1100  BP: 112/82  Pulse: 72  Resp: 18  Temp: 98 F (36.7 C)  TempSrc: Oral  SpO2: 99%  Weight: 203 lb 12.8 oz (92.4 kg)  Height: '5\' 11"'$  (1.803 m)   . GENERAL:alert, no distress and comfortable. SKIN: skin color, texture, turgor are normal, no rashes or significant lesions EYES: normal, conjunctiva are pink and non-injected, sclera clear OROPHARYNX:no exudate, no erythema and lips, buccal mucosa, and tongue normal  NECK: supple, thyroid normal size, non-tender, without nodularity LYMPH:  no palpable lymphadenopathy in the cervical, axillary or inguinal LUNGS: clear to auscultation and percussion with normal breathing effort HEART: regular rate & rhythm and no murmurs and no lower extremity edema ABDOMEN:abdomen soft, Surgical scar in the midline around the umbilical has healed well. non-tender and normal bowel sounds Musculoskeletal:no cyanosis of digits and no clubbing  PSYCH: alert & oriented x 3 with fluent speech NEURO: no focal motor/sensory deficits  LABORATORY DATA:  I have reviewed the data as listed CBC Latest Ref Rng & Units 10/26/2016 10/05/2016 09/21/2016  WBC 4.0 - 10.3 10e3/uL 3.6(L) 3.4(L) 3.2(L)  Hemoglobin 13.0 - 17.1 g/dL 12.6(L) 12.1(L) 11.3(L)  Hematocrit 38.4 - 49.9 % 36.9(L) 35.6(L) 34.4(L)  Platelets 140 - 400 10e3/uL 175 116(L) 98(L)   CMP Latest Ref Rng & Units 10/05/2016 09/21/2016 09/07/2016  Glucose 70 - 140 mg/dl 118 83 76  BUN 7.0 - 26.0 mg/dL 14.8 18.4 24.5  Creatinine 0.7 - 1.3 mg/dL 1.1 1.0 1.1  Sodium 136 - 145 mEq/L 140 141 141  Potassium 3.5 - 5.1 mEq/L 4.4 4.4 4.8  Chloride 101 - 111 mmol/L - - -  CO2 22 - 29 mEq/L '24 24 24  '$ Calcium 8.4 - 10.4 mg/dL 9.2 9.2 9.5  Total Protein 6.4 - 8.3 g/dL 6.9 6.7 7.1  Total Bilirubin 0.20 - 1.20 mg/dL 0.60 0.35 0.39  Alkaline Phos 40 -  150 U/L 52 49 55  AST 5 - 34 U/L '18 19 23  '$ ALT 0 - 55 U/L '21 23 26    '$ Surgical path   Diagnosis 06/14/2016 1. Colon, segmental resection for tumor, Right Ascending Hemicolectomy and Small Bowel - INVASIVE WELL TO MODERATELY DIFFERENTIATED ADENOCARCINOMA. - TWO TUMOR FOCI MEASURING 7.5 CM AND 4.5 CM IN GREATEST DIMENSION. - TUMOR INVADES THROUGH FULL THICKNESS OF COLON, THROUGH THE SEROSA TO INVOLVE THE SMALL BOWEL. - MARGINS ARE NEGATIVE. - TWENTY FOUR OF SIXTY FOUR LYMPH NODES POSITIVE FOR METASTATIC ADENOCARCINOMA (24/64). - EXTRACAPSULAR EXTENSION IDENTIFIED - MULTIPLE SATELLITE TUMOR DEPOSITS PRESENT. - SEE ONCOLOGY TEMPLATE. 2. Liver, needle/core biopsy, Right Inferior - POSITIVE FOR METASTATIC ADENOCARCINOMA.    RADIOGRAPHIC STUDIES: I have personally reviewed his outside CT scan from 05/29/2016 and agreed with the findings in the report. No results found.  CT CAP 09/04/16 IMPRESSION: Interval right colectomy. Stable small liver metastasis in the inferior right hepatic lobe. Stable mild porta hepatis and aortocaval lymphadenopathy. Stable mild mediastinal lymphadenopathy. No new or progressive metastatic disease identified within the chest, abdomen, or pelvis.  ASSESSMENT & PLAN:  56 y.o. Caucasian male, without significant past medical history, presented with rectal bleeding in December 2016, colonoscopy showed 4 polyps, 2 of them showed invasive adenocarcinoma, unfortunately he was not aware of the pathology findings and was not treated. He now presented with fatigue, weight loss, anorexia and abdominal pain  1. Right colon cancer with liver and node metastasis, [id number], stage IV, MSI-stable  -I previously reviewed her PET scan findings, which showed solitary liver metastasis, abdominal and possible thoracic node metastasis  -His liver biopsy confirmed metastasis -Due to the biopsy action, he underwent upfront hemicolectomy. I previously reviewed his surgical  pathology findings, which showed 2 primary right colon cancer, very locally advanced with 24 lymph nodes positive, surgical margins were negative. -We previously reviewed the patient's CEA from 06/06/16 which was 9.99. -I discussed his Foundation one genomic testing results, which showed care arrest mutation, and as high stable, low tumor burden. So he would not benefit from EGFR inhibitor, or immunotherapy alone. -The patient has started first-line chemotherapy FOLFOX. Avastin was added from cycle 3 -The goal for chemotherapy is palliative, giving his metastatic disease. -He has solitary liver metastasis, but multiple mild hypermetabolic adenopathy in mediastinum and abdomen. We previously discussed the possible local therapy options, such as liver metastectomy or ablation if he has great response to chemo and his node metastasis resolves  -restaging CT from 09/04/2016 reviewed with pt, stable disease. Will continue chemo -he has developed more side effects from chemo FOLFOX, and a worsening thrombocytopenia, despite reduced dose of oxaliplatin  -Due to his overall limited response to FOLFOX, and his wish to pursue surgery, I previously recommend him to change chemo to FOLFIRINOX with low dose oxaliplatin, hopefully he will response better. Potential side effects and benefit of adding irinotecan were discussed with patient, he agrees to proceed.  -due to his worsening neuropathy and persistent thrombocytopenia, I'll change chemotherapy to FOLFIRI today, may consider add oxaliplatin back if he can tolerate -I will repeat PET scan in 3 months for restaging purposes. He may be a candidate for liver surgery or ablation if he has excellent response to chemo  and nodes become negative on next PET. -labs reviewed and white count slightly lower so we will give neulasta shot when he gets his next pump DC  -We discussed his next trip and seminar to alter treatment schedule.  -We discussed using sun screen an hat  when in the sun.  -Will repeat PET scan in early August, will order at next visit.   2. Hypertension -His blood pressure has been high lately, I have previously given him clonidine 0.1 mg in the infusion room before Avastin -We previously discussed Avastin can cause hypertension -I have started him on amlodipine, and increase dose to 10 million daily -He does not want to follow-up with his primary care physician. -His blood picture remains to be high, especially diastolic blood pressure. I added on lisinopril 10 mg daily for him -I previously Changed Amlodipine prescription to '10mg'$    3. Anemia of iron deficiency  -He has mild anemia, likely related to his colon cancer bleeding. -His HGB was low following surgery.  -I previously recommended him to take oral iron supplement over-the-counter, potential side effects of constipation and gastric or discomfort or discussed with him. He stopped taking this before surgery. -I previously advised him to begin taking oral iron supplements again. - anemia is improving  - We will continue to monitor iron levels   4. Anorexia and weight loss -Secondary to underlying malignancy -I previously encouraged him to try nutritional supplement, such as boost or initial -The patient has previously gained some weight .  5. Peripheral neuropathy, G2 -Secondary to oxaliplatin, previously held -Continue observation  6. Goal of care discussion  -We previously discussed the incurable nature of his cancer, and the overall poor prognosis, especially if he does not have good response to chemotherapy or progress on chemo -The patient understands the goal of care is palliative.  -He is full code now    Plan  -Labs reviewed and adequate to proceed with FOLFIRINOX with Lower dose Oxaliplatin and neulasta on day 3 -Lab, flush, f/u and chemo FOLFIRINOX on 7/2 and 7/23, postpone due to his vacation and work schedule    No orders of the defined types were placed in  this encounter.   All questions were answered.   The patient knows to call the clinic with any problems, questions or concerns. I spent 25 minutes counseling the patient face to face. The total time spent in the appointment was 30 minutes and more than 50% was on counseling.  This document serves as a record of services personally performed by Truitt Merle, MD. It was created on her behalf by Joslyn Devon, a trained medical scribe. The creation of this record is based on the scribe's personal observations and the provider's statements to them. This document has been checked and approved by the attending provider. Truitt Merle, MD 10/26/2016

## 2016-10-19 ENCOUNTER — Encounter: Payer: BLUE CROSS/BLUE SHIELD | Admitting: Nutrition

## 2016-10-19 ENCOUNTER — Ambulatory Visit: Payer: BLUE CROSS/BLUE SHIELD

## 2016-10-19 ENCOUNTER — Other Ambulatory Visit: Payer: BLUE CROSS/BLUE SHIELD

## 2016-10-26 ENCOUNTER — Encounter: Payer: Self-pay | Admitting: Hematology

## 2016-10-26 ENCOUNTER — Ambulatory Visit (HOSPITAL_BASED_OUTPATIENT_CLINIC_OR_DEPARTMENT_OTHER): Payer: BLUE CROSS/BLUE SHIELD | Admitting: Hematology

## 2016-10-26 ENCOUNTER — Ambulatory Visit (HOSPITAL_BASED_OUTPATIENT_CLINIC_OR_DEPARTMENT_OTHER): Payer: BLUE CROSS/BLUE SHIELD

## 2016-10-26 ENCOUNTER — Telehealth: Payer: Self-pay | Admitting: Hematology

## 2016-10-26 ENCOUNTER — Other Ambulatory Visit (HOSPITAL_BASED_OUTPATIENT_CLINIC_OR_DEPARTMENT_OTHER): Payer: BLUE CROSS/BLUE SHIELD

## 2016-10-26 VITALS — BP 112/82 | HR 72 | Temp 98.0°F | Resp 18 | Ht 71.0 in | Wt 203.8 lb

## 2016-10-26 DIAGNOSIS — C787 Secondary malignant neoplasm of liver and intrahepatic bile duct: Secondary | ICD-10-CM

## 2016-10-26 DIAGNOSIS — D500 Iron deficiency anemia secondary to blood loss (chronic): Secondary | ICD-10-CM

## 2016-10-26 DIAGNOSIS — C189 Malignant neoplasm of colon, unspecified: Secondary | ICD-10-CM

## 2016-10-26 DIAGNOSIS — Z7189 Other specified counseling: Secondary | ICD-10-CM

## 2016-10-26 DIAGNOSIS — C182 Malignant neoplasm of ascending colon: Secondary | ICD-10-CM

## 2016-10-26 DIAGNOSIS — R630 Anorexia: Secondary | ICD-10-CM | POA: Diagnosis not present

## 2016-10-26 DIAGNOSIS — C772 Secondary and unspecified malignant neoplasm of intra-abdominal lymph nodes: Secondary | ICD-10-CM

## 2016-10-26 DIAGNOSIS — D509 Iron deficiency anemia, unspecified: Secondary | ICD-10-CM

## 2016-10-26 DIAGNOSIS — Z5111 Encounter for antineoplastic chemotherapy: Secondary | ICD-10-CM

## 2016-10-26 DIAGNOSIS — G620 Drug-induced polyneuropathy: Secondary | ICD-10-CM | POA: Diagnosis not present

## 2016-10-26 DIAGNOSIS — Z5112 Encounter for antineoplastic immunotherapy: Secondary | ICD-10-CM | POA: Diagnosis not present

## 2016-10-26 DIAGNOSIS — R634 Abnormal weight loss: Secondary | ICD-10-CM

## 2016-10-26 DIAGNOSIS — I10 Essential (primary) hypertension: Secondary | ICD-10-CM

## 2016-10-26 DIAGNOSIS — D696 Thrombocytopenia, unspecified: Secondary | ICD-10-CM

## 2016-10-26 LAB — FERRITIN: FERRITIN: 89 ng/mL (ref 22–316)

## 2016-10-26 LAB — CBC WITH DIFFERENTIAL/PLATELET
BASO%: 1.1 % (ref 0.0–2.0)
Basophils Absolute: 0 10*3/uL (ref 0.0–0.1)
EOS%: 1.3 % (ref 0.0–7.0)
Eosinophils Absolute: 0 10*3/uL (ref 0.0–0.5)
HEMATOCRIT: 36.9 % — AB (ref 38.4–49.9)
HEMOGLOBIN: 12.6 g/dL — AB (ref 13.0–17.1)
LYMPH#: 0.7 10*3/uL — AB (ref 0.9–3.3)
LYMPH%: 18.7 % (ref 14.0–49.0)
MCH: 31.2 pg (ref 27.2–33.4)
MCHC: 34 g/dL (ref 32.0–36.0)
MCV: 91.9 fL (ref 79.3–98.0)
MONO#: 0.4 10*3/uL (ref 0.1–0.9)
MONO%: 12 % (ref 0.0–14.0)
NEUT%: 66.9 % (ref 39.0–75.0)
NEUTROS ABS: 2.4 10*3/uL (ref 1.5–6.5)
PLATELETS: 175 10*3/uL (ref 140–400)
RBC: 4.02 10*6/uL — ABNORMAL LOW (ref 4.20–5.82)
RDW: 19.3 % — ABNORMAL HIGH (ref 11.0–14.6)
WBC: 3.6 10*3/uL — AB (ref 4.0–10.3)

## 2016-10-26 LAB — IRON AND TIBC
%SAT: 25 % (ref 20–55)
IRON: 87 ug/dL (ref 42–163)
TIBC: 350 ug/dL (ref 202–409)
UIBC: 263 ug/dL (ref 117–376)

## 2016-10-26 LAB — COMPREHENSIVE METABOLIC PANEL
ALBUMIN: 3.8 g/dL (ref 3.5–5.0)
ALT: 15 U/L (ref 0–55)
ANION GAP: 8 meq/L (ref 3–11)
AST: 18 U/L (ref 5–34)
Alkaline Phosphatase: 58 U/L (ref 40–150)
BILIRUBIN TOTAL: 0.38 mg/dL (ref 0.20–1.20)
BUN: 14.1 mg/dL (ref 7.0–26.0)
CALCIUM: 9.6 mg/dL (ref 8.4–10.4)
CO2: 24 meq/L (ref 22–29)
CREATININE: 1.2 mg/dL (ref 0.7–1.3)
Chloride: 108 mEq/L (ref 98–109)
EGFR: 71 mL/min/{1.73_m2} — ABNORMAL LOW (ref >90)
Glucose: 95 mg/dl (ref 70–140)
Potassium: 4.6 mEq/L (ref 3.5–5.1)
Sodium: 140 mEq/L (ref 136–145)
TOTAL PROTEIN: 7 g/dL (ref 6.4–8.3)

## 2016-10-26 LAB — CEA (IN HOUSE-CHCC): CEA (CHCC-IN HOUSE): 4.12 ng/mL (ref 0.00–5.00)

## 2016-10-26 MED ORDER — DEXAMETHASONE SODIUM PHOSPHATE 10 MG/ML IJ SOLN
INTRAMUSCULAR | Status: AC
  Filled 2016-10-26: qty 1

## 2016-10-26 MED ORDER — SODIUM CHLORIDE 0.9 % IV SOLN
5.0000 mg/kg | Freq: Once | INTRAVENOUS | Status: AC
  Administered 2016-10-26: 450 mg via INTRAVENOUS
  Filled 2016-10-26: qty 14

## 2016-10-26 MED ORDER — DEXTROSE 5 % IV SOLN
Freq: Once | INTRAVENOUS | Status: AC
  Administered 2016-10-26: 15:00:00 via INTRAVENOUS

## 2016-10-26 MED ORDER — OXALIPLATIN CHEMO INJECTION 100 MG/20ML
60.0000 mg/m2 | Freq: Once | INTRAVENOUS | Status: AC
  Administered 2016-10-26: 125 mg via INTRAVENOUS
  Filled 2016-10-26: qty 5

## 2016-10-26 MED ORDER — ATROPINE SULFATE 1 MG/ML IJ SOLN
0.4000 mg | Freq: Once | INTRAMUSCULAR | Status: AC
  Administered 2016-10-26: 0.4 mg via INTRAVENOUS

## 2016-10-26 MED ORDER — ATROPINE SULFATE 1 MG/ML IJ SOLN
INTRAMUSCULAR | Status: AC
  Filled 2016-10-26: qty 1

## 2016-10-26 MED ORDER — PALONOSETRON HCL INJECTION 0.25 MG/5ML
0.2500 mg | Freq: Once | INTRAVENOUS | Status: AC
  Administered 2016-10-26: 0.25 mg via INTRAVENOUS

## 2016-10-26 MED ORDER — LEUCOVORIN CALCIUM INJECTION 350 MG
400.0000 mg/m2 | Freq: Once | INTRAVENOUS | Status: AC
  Administered 2016-10-26: 840 mg via INTRAVENOUS
  Filled 2016-10-26: qty 42

## 2016-10-26 MED ORDER — DEXTROSE 5 % IV SOLN
180.0000 mg/m2 | Freq: Once | INTRAVENOUS | Status: AC
  Administered 2016-10-26: 380 mg via INTRAVENOUS
  Filled 2016-10-26: qty 15

## 2016-10-26 MED ORDER — DEXAMETHASONE SODIUM PHOSPHATE 10 MG/ML IJ SOLN
10.0000 mg | Freq: Once | INTRAMUSCULAR | Status: AC
  Administered 2016-10-26: 10 mg via INTRAVENOUS

## 2016-10-26 MED ORDER — PALONOSETRON HCL INJECTION 0.25 MG/5ML
INTRAVENOUS | Status: AC
Start: 2016-10-26 — End: 2016-10-26
  Filled 2016-10-26: qty 5

## 2016-10-26 MED ORDER — SODIUM CHLORIDE 0.9 % IV SOLN
2390.0000 mg/m2 | INTRAVENOUS | Status: DC
  Administered 2016-10-26: 5000 mg via INTRAVENOUS
  Filled 2016-10-26: qty 100

## 2016-10-26 NOTE — Progress Notes (Signed)
Per Dr Burr Medico OK to increase 5FU infusion rate to finish in 44 hrs.

## 2016-10-26 NOTE — Patient Instructions (Signed)
[facility] Discharge Instructions for Patients Receiving Chemotherapy  Today you received the following chemotherapy agents:  Irinotecan, Oxaliplatin, Leucovorin, Fluorouracil  To help prevent nausea and vomiting after your treatment, we encourage you to take your nausea medication as prescribed.   If you develop nausea and vomiting that is not controlled by your nausea medication, call the clinic.   BELOW ARE SYMPTOMS THAT SHOULD BE REPORTED IMMEDIATELY:  *FEVER GREATER THAN 100.5 F  *CHILLS WITH OR WITHOUT FEVER  NAUSEA AND VOMITING THAT IS NOT CONTROLLED WITH YOUR NAUSEA MEDICATION  *UNUSUAL SHORTNESS OF BREATH  *UNUSUAL BRUISING OR BLEEDING  TENDERNESS IN MOUTH AND THROAT WITH OR WITHOUT PRESENCE OF ULCERS  *URINARY PROBLEMS  *BOWEL PROBLEMS  UNUSUAL RASH Items with * indicate a potential emergency and should be followed up as soon as possible.  Feel free to call the clinic you have any questions or concerns. The clinic phone number is [phone].  Please show the [facility] at check-in to the Emergency Department and triage nurse.

## 2016-10-26 NOTE — Telephone Encounter (Signed)
R/s appt per patietn - patient is aware of appt date and time 7/26

## 2016-10-26 NOTE — Telephone Encounter (Signed)
All 11/09/16 appointments was rescheduled to 11/13/16, per 10/26/16 los. Lab, flush, Chemo and follow up was scheduled for 12/04/16, per 10/26/16 los. Patient was given a copy of the AVS report and appointment schedule, per 10/26/16 los.

## 2016-10-28 ENCOUNTER — Ambulatory Visit (HOSPITAL_BASED_OUTPATIENT_CLINIC_OR_DEPARTMENT_OTHER): Payer: BLUE CROSS/BLUE SHIELD

## 2016-10-28 ENCOUNTER — Encounter: Payer: Self-pay | Admitting: Hematology

## 2016-10-28 VITALS — BP 135/88 | HR 62 | Temp 97.7°F | Resp 18

## 2016-10-28 DIAGNOSIS — C182 Malignant neoplasm of ascending colon: Secondary | ICD-10-CM

## 2016-10-28 DIAGNOSIS — C787 Secondary malignant neoplasm of liver and intrahepatic bile duct: Principal | ICD-10-CM

## 2016-10-28 DIAGNOSIS — C189 Malignant neoplasm of colon, unspecified: Secondary | ICD-10-CM

## 2016-10-28 DIAGNOSIS — Z5189 Encounter for other specified aftercare: Secondary | ICD-10-CM

## 2016-10-28 MED ORDER — HEPARIN SOD (PORK) LOCK FLUSH 100 UNIT/ML IV SOLN
500.0000 [IU] | Freq: Once | INTRAVENOUS | Status: AC | PRN
  Administered 2016-10-28: 500 [IU]
  Filled 2016-10-28: qty 5

## 2016-10-28 MED ORDER — SODIUM CHLORIDE 0.9% FLUSH
10.0000 mL | INTRAVENOUS | Status: DC | PRN
  Administered 2016-10-28: 10 mL
  Filled 2016-10-28: qty 10

## 2016-10-28 MED ORDER — PEGFILGRASTIM INJECTION 6 MG/0.6ML ~~LOC~~
6.0000 mg | PREFILLED_SYRINGE | Freq: Once | SUBCUTANEOUS | Status: AC
  Administered 2016-10-28: 6 mg via SUBCUTANEOUS

## 2016-10-28 NOTE — Progress Notes (Signed)
Pt educated to take compazine as directed and continue to drink water. Pt declined refill on phenergan at this time. Pt educated that if the compazine does not treat vomiting and pt is unable to take in adequate fluids to call clinic. Pt verbalizes understanding. Pt stable at discharge.

## 2016-10-28 NOTE — Patient Instructions (Signed)
Implanted Port Home Guide An implanted port is a type of central line that is placed under the skin. Central lines are used to provide IV access when treatment or nutrition needs to be given through a person's veins. Implanted ports are used for long-term IV access. An implanted port may be placed because:  You need IV medicine that would be irritating to the small veins in your hands or arms.  You need long-term IV medicines, such as antibiotics.  You need IV nutrition for a long period.  You need frequent blood draws for lab tests.  You need dialysis.  Implanted ports are usually placed in the chest area, but they can also be placed in the upper arm, the abdomen, or the leg. An implanted port has two main parts:  Reservoir. The reservoir is round and will appear as a small, raised area under your skin. The reservoir is the part where a needle is inserted to give medicines or draw blood.  Catheter. The catheter is a thin, flexible tube that extends from the reservoir. The catheter is placed into a large vein. Medicine that is inserted into the reservoir goes into the catheter and then into the vein.  How will I care for my incision site? Do not get the incision site wet. Bathe or shower as directed by your health care provider. How is my port accessed? Special steps must be taken to access the port:  Before the port is accessed, a numbing cream can be placed on the skin. This helps numb the skin over the port site.  Your health care provider uses a sterile technique to access the port. ? Your health care provider must put on a mask and sterile gloves. ? The skin over your port is cleaned carefully with an antiseptic and allowed to dry. ? The port is gently pinched between sterile gloves, and a needle is inserted into the port.  Only "non-coring" port needles should be used to access the port. Once the port is accessed, a blood return should be checked. This helps ensure that the port  is in the vein and is not clogged.  If your port needs to remain accessed for a constant infusion, a clear (transparent) bandage will be placed over the needle site. The bandage and needle will need to be changed every week, or as directed by your health care provider.  Keep the bandage covering the needle clean and dry. Do not get it wet. Follow your health care provider's instructions on how to take a shower or bath while the port is accessed.  If your port does not need to stay accessed, no bandage is needed over the port.  What is flushing? Flushing helps keep the port from getting clogged. Follow your health care provider's instructions on how and when to flush the port. Ports are usually flushed with saline solution or a medicine called heparin. The need for flushing will depend on how the port is used.  If the port is used for intermittent medicines or blood draws, the port will need to be flushed: ? After medicines have been given. ? After blood has been drawn. ? As part of routine maintenance.  If a constant infusion is running, the port may not need to be flushed.  How long will my port stay implanted? The port can stay in for as long as your health care provider thinks it is needed. When it is time for the port to come out, surgery will be   done to remove it. The procedure is similar to the one performed when the port was put in. When should I seek immediate medical care? When you have an implanted port, you should seek immediate medical care if:  You notice a bad smell coming from the incision site.  You have swelling, redness, or drainage at the incision site.  You have more swelling or pain at the port site or the surrounding area.  You have a fever that is not controlled with medicine.  This information is not intended to replace advice given to you by your health care provider. Make sure you discuss any questions you have with your health care provider. Document  Released: 05/01/2005 Document Revised: 10/07/2015 Document Reviewed: 01/06/2013 Elsevier Interactive Patient Education  2017 [employer]  

## 2016-10-31 ENCOUNTER — Encounter: Payer: Self-pay | Admitting: Hematology

## 2016-11-06 ENCOUNTER — Encounter: Payer: Self-pay | Admitting: Hematology

## 2016-11-09 ENCOUNTER — Ambulatory Visit: Payer: BLUE CROSS/BLUE SHIELD

## 2016-11-09 ENCOUNTER — Other Ambulatory Visit: Payer: BLUE CROSS/BLUE SHIELD

## 2016-11-09 ENCOUNTER — Ambulatory Visit: Payer: BLUE CROSS/BLUE SHIELD | Admitting: Nurse Practitioner

## 2016-11-13 ENCOUNTER — Ambulatory Visit (HOSPITAL_BASED_OUTPATIENT_CLINIC_OR_DEPARTMENT_OTHER): Payer: BLUE CROSS/BLUE SHIELD | Admitting: Nurse Practitioner

## 2016-11-13 ENCOUNTER — Other Ambulatory Visit: Payer: Self-pay | Admitting: *Deleted

## 2016-11-13 ENCOUNTER — Ambulatory Visit (HOSPITAL_BASED_OUTPATIENT_CLINIC_OR_DEPARTMENT_OTHER): Payer: BLUE CROSS/BLUE SHIELD

## 2016-11-13 ENCOUNTER — Other Ambulatory Visit (HOSPITAL_BASED_OUTPATIENT_CLINIC_OR_DEPARTMENT_OTHER): Payer: BLUE CROSS/BLUE SHIELD

## 2016-11-13 VITALS — BP 131/83 | HR 55

## 2016-11-13 VITALS — BP 128/83 | HR 55 | Temp 97.8°F | Resp 20 | Ht 71.0 in | Wt 202.9 lb

## 2016-11-13 DIAGNOSIS — Z95828 Presence of other vascular implants and grafts: Secondary | ICD-10-CM

## 2016-11-13 DIAGNOSIS — C787 Secondary malignant neoplasm of liver and intrahepatic bile duct: Secondary | ICD-10-CM

## 2016-11-13 DIAGNOSIS — C182 Malignant neoplasm of ascending colon: Secondary | ICD-10-CM

## 2016-11-13 DIAGNOSIS — I10 Essential (primary) hypertension: Secondary | ICD-10-CM

## 2016-11-13 DIAGNOSIS — G620 Drug-induced polyneuropathy: Secondary | ICD-10-CM | POA: Diagnosis not present

## 2016-11-13 DIAGNOSIS — Z5111 Encounter for antineoplastic chemotherapy: Secondary | ICD-10-CM | POA: Diagnosis not present

## 2016-11-13 DIAGNOSIS — Z5112 Encounter for antineoplastic immunotherapy: Secondary | ICD-10-CM

## 2016-11-13 DIAGNOSIS — C189 Malignant neoplasm of colon, unspecified: Secondary | ICD-10-CM

## 2016-11-13 DIAGNOSIS — D509 Iron deficiency anemia, unspecified: Secondary | ICD-10-CM

## 2016-11-13 DIAGNOSIS — Z452 Encounter for adjustment and management of vascular access device: Secondary | ICD-10-CM | POA: Diagnosis not present

## 2016-11-13 LAB — CBC WITH DIFFERENTIAL/PLATELET
BASO%: 1.2 % (ref 0.0–2.0)
Basophils Absolute: 0.1 10*3/uL (ref 0.0–0.1)
EOS%: 2.3 % (ref 0.0–7.0)
Eosinophils Absolute: 0.1 10*3/uL (ref 0.0–0.5)
HEMATOCRIT: 35.1 % — AB (ref 38.4–49.9)
HEMOGLOBIN: 11.8 g/dL — AB (ref 13.0–17.1)
LYMPH#: 0.8 10*3/uL — AB (ref 0.9–3.3)
LYMPH%: 14.3 % (ref 14.0–49.0)
MCH: 31.7 pg (ref 27.2–33.4)
MCHC: 33.6 g/dL (ref 32.0–36.0)
MCV: 94.5 fL (ref 79.3–98.0)
MONO#: 0.5 10*3/uL (ref 0.1–0.9)
MONO%: 9.2 % (ref 0.0–14.0)
NEUT%: 73 % (ref 39.0–75.0)
NEUTROS ABS: 4.1 10*3/uL (ref 1.5–6.5)
Platelets: 136 10*3/uL — ABNORMAL LOW (ref 140–400)
RBC: 3.71 10*6/uL — ABNORMAL LOW (ref 4.20–5.82)
RDW: 17.9 % — AB (ref 11.0–14.6)
WBC: 5.6 10*3/uL (ref 4.0–10.3)

## 2016-11-13 LAB — COMPREHENSIVE METABOLIC PANEL
ALBUMIN: 3.7 g/dL (ref 3.5–5.0)
ALT: 16 U/L (ref 0–55)
AST: 18 U/L (ref 5–34)
Alkaline Phosphatase: 74 U/L (ref 40–150)
Anion Gap: 8 mEq/L (ref 3–11)
BUN: 11.7 mg/dL (ref 7.0–26.0)
CALCIUM: 9.2 mg/dL (ref 8.4–10.4)
CHLORIDE: 109 meq/L (ref 98–109)
CO2: 25 mEq/L (ref 22–29)
CREATININE: 1.1 mg/dL (ref 0.7–1.3)
EGFR: 77 mL/min/{1.73_m2} — ABNORMAL LOW (ref >90)
Glucose: 97 mg/dl (ref 70–140)
Potassium: 4.6 mEq/L (ref 3.5–5.1)
Sodium: 141 mEq/L (ref 136–145)
TOTAL PROTEIN: 6.8 g/dL (ref 6.4–8.3)
Total Bilirubin: 0.32 mg/dL (ref 0.20–1.20)

## 2016-11-13 LAB — UA PROTEIN, DIPSTICK - CHCC: Protein, ur: NEGATIVE mg/dL

## 2016-11-13 MED ORDER — SODIUM CHLORIDE 0.9% FLUSH
10.0000 mL | Freq: Once | INTRAVENOUS | Status: AC
  Administered 2016-11-13: 10 mL
  Filled 2016-11-13: qty 10

## 2016-11-13 MED ORDER — SODIUM CHLORIDE 0.9 % IV SOLN
2375.0000 mg/m2 | INTRAVENOUS | Status: DC
  Administered 2016-11-13: 5000 mg via INTRAVENOUS
  Filled 2016-11-13: qty 100

## 2016-11-13 MED ORDER — ATROPINE SULFATE 1 MG/ML IJ SOLN
INTRAMUSCULAR | Status: AC
  Filled 2016-11-13: qty 1

## 2016-11-13 MED ORDER — LEUCOVORIN CALCIUM INJECTION 350 MG
400.0000 mg/m2 | Freq: Once | INTRAVENOUS | Status: AC
  Administered 2016-11-13: 840 mg via INTRAVENOUS
  Filled 2016-11-13: qty 42

## 2016-11-13 MED ORDER — PALONOSETRON HCL INJECTION 0.25 MG/5ML
0.2500 mg | Freq: Once | INTRAVENOUS | Status: AC
  Administered 2016-11-13: 0.25 mg via INTRAVENOUS

## 2016-11-13 MED ORDER — SODIUM CHLORIDE 0.9 % IV SOLN
5.0000 mg/kg | Freq: Once | INTRAVENOUS | Status: AC
  Administered 2016-11-13: 450 mg via INTRAVENOUS
  Filled 2016-11-13: qty 16

## 2016-11-13 MED ORDER — OXALIPLATIN CHEMO INJECTION 100 MG/20ML
60.0000 mg/m2 | Freq: Once | INTRAVENOUS | Status: AC
  Administered 2016-11-13: 125 mg via INTRAVENOUS
  Filled 2016-11-13: qty 5

## 2016-11-13 MED ORDER — DEXAMETHASONE SODIUM PHOSPHATE 10 MG/ML IJ SOLN
INTRAMUSCULAR | Status: AC
  Filled 2016-11-13: qty 1

## 2016-11-13 MED ORDER — DEXTROSE 5 % IV SOLN
Freq: Once | INTRAVENOUS | Status: AC
  Administered 2016-11-13: 12:00:00 via INTRAVENOUS

## 2016-11-13 MED ORDER — ATROPINE SULFATE 1 MG/ML IJ SOLN
0.4000 mg | Freq: Once | INTRAMUSCULAR | Status: AC
  Administered 2016-11-13: 0.4 mg via INTRAVENOUS

## 2016-11-13 MED ORDER — PALONOSETRON HCL INJECTION 0.25 MG/5ML
INTRAVENOUS | Status: AC
  Filled 2016-11-13: qty 5

## 2016-11-13 MED ORDER — DEXAMETHASONE 4 MG PO TABS: 4.0000 mg | ORAL_TABLET | Freq: Two times a day (BID) | ORAL | 0 refills | Status: DC

## 2016-11-13 MED ORDER — IRINOTECAN HCL CHEMO INJECTION 100 MG/5ML
180.0000 mg/m2 | Freq: Once | INTRAVENOUS | Status: AC
  Administered 2016-11-13: 380 mg via INTRAVENOUS
  Filled 2016-11-13: qty 4

## 2016-11-13 MED ORDER — DEXAMETHASONE SODIUM PHOSPHATE 10 MG/ML IJ SOLN
10.0000 mg | Freq: Once | INTRAMUSCULAR | Status: AC
  Administered 2016-11-13: 10 mg via INTRAVENOUS

## 2016-11-13 NOTE — Patient Instructions (Signed)
[facility] Discharge Instructions for Patients Receiving Chemotherapy  Today you received the following chemotherapy agents:  Irinotecan, Oxaliplatin, Leucovorin, Fluorouracil  To help prevent nausea and vomiting after your treatment, we encourage you to take your nausea medication as prescribed.   If you develop nausea and vomiting that is not controlled by your nausea medication, call the clinic.   BELOW ARE SYMPTOMS THAT SHOULD BE REPORTED IMMEDIATELY:  *FEVER GREATER THAN 100.5 F  *CHILLS WITH OR WITHOUT FEVER  NAUSEA AND VOMITING THAT IS NOT CONTROLLED WITH YOUR NAUSEA MEDICATION  *UNUSUAL SHORTNESS OF BREATH  *UNUSUAL BRUISING OR BLEEDING  TENDERNESS IN MOUTH AND THROAT WITH OR WITHOUT PRESENCE OF ULCERS  *URINARY PROBLEMS  *BOWEL PROBLEMS  UNUSUAL RASH Items with * indicate a potential emergency and should be followed up as soon as possible.  Feel free to call the clinic you have any questions or concerns. The clinic phone number is [phone].  Please show the [facility] at check-in to the Emergency Department and triage nurse.

## 2016-11-13 NOTE — Patient Instructions (Signed)
Implanted Port Home Guide An implanted port is a type of central line that is placed under the skin. Central lines are used to provide IV access when treatment or nutrition needs to be given through a person's veins. Implanted ports are used for long-term IV access. An implanted port may be placed because:  You need IV medicine that would be irritating to the small veins in your hands or arms.  You need long-term IV medicines, such as antibiotics.  You need IV nutrition for a long period.  You need frequent blood draws for lab tests.  You need dialysis.  Implanted ports are usually placed in the chest area, but they can also be placed in the upper arm, the abdomen, or the leg. An implanted port has two main parts:  Reservoir. The reservoir is round and will appear as a small, raised area under your skin. The reservoir is the part where a needle is inserted to give medicines or draw blood.  Catheter. The catheter is a thin, flexible tube that extends from the reservoir. The catheter is placed into a large vein. Medicine that is inserted into the reservoir goes into the catheter and then into the vein.  How will I care for my incision site? Do not get the incision site wet. Bathe or shower as directed by your health care provider. How is my port accessed? Special steps must be taken to access the port:  Before the port is accessed, a numbing cream can be placed on the skin. This helps numb the skin over the port site.  Your health care provider uses a sterile technique to access the port. ? Your health care provider must put on a mask and sterile gloves. ? The skin over your port is cleaned carefully with an antiseptic and allowed to dry. ? The port is gently pinched between sterile gloves, and a needle is inserted into the port.  Only "non-coring" port needles should be used to access the port. Once the port is accessed, a blood return should be checked. This helps ensure that the port  is in the vein and is not clogged.  If your port needs to remain accessed for a constant infusion, a clear (transparent) bandage will be placed over the needle site. The bandage and needle will need to be changed every week, or as directed by your health care provider.  Keep the bandage covering the needle clean and dry. Do not get it wet. Follow your health care provider's instructions on how to take a shower or bath while the port is accessed.  If your port does not need to stay accessed, no bandage is needed over the port.  What is flushing? Flushing helps keep the port from getting clogged. Follow your health care provider's instructions on how and when to flush the port. Ports are usually flushed with saline solution or a medicine called heparin. The need for flushing will depend on how the port is used.  If the port is used for intermittent medicines or blood draws, the port will need to be flushed: ? After medicines have been given. ? After blood has been drawn. ? As part of routine maintenance.  If a constant infusion is running, the port may not need to be flushed.  How long will my port stay implanted? The port can stay in for as long as your health care provider thinks it is needed. When it is time for the port to come out, surgery will be   done to remove it. The procedure is similar to the one performed when the port was put in. When should I seek immediate medical care? When you have an implanted port, you should seek immediate medical care if:  You notice a bad smell coming from the incision site.  You have swelling, redness, or drainage at the incision site.  You have more swelling or pain at the port site or the surrounding area.  You have a fever that is not controlled with medicine.  This information is not intended to replace advice given to you by your health care provider. Make sure you discuss any questions you have with your health care provider. Document  Released: 05/01/2005 Document Revised: 10/07/2015 Document Reviewed: 01/06/2013 Elsevier Interactive Patient Education  2017 [employer]  

## 2016-11-13 NOTE — Progress Notes (Signed)
  [facility] OFFICE PROGRESS NOTE   Diagnosis:  Metastatic colon cancer  INTERVAL HISTORY:   Nicholas Lowery returns as scheduled. He completed another cycle of FOLFIRINOX/Avastin 10/26/2016. He had several episodes of vomiting beginning day 2. This lasted for about 3 days. He had no significant nausea. He has 2 loose stools a day. He states that his teeth are "numb". Intermittent "stinging" sensation in the feet. Does not interfere with activity.  Objective:  Vital signs in last 24 hours:  Blood pressure 128/83, pulse (!) 55, temperature 97.8 F (36.6 C), temperature source Oral, resp. rate 20, height 5' 11" (1.803 m), weight 202 lb 14.4 oz (92 kg), SpO2 100 %.    HEENT: No thrush or ulcers. Resp: Lungs clear bilaterally. Cardio: Regular rate and rhythm. GI: No hepatomegaly. Vascular: No leg edema. Neuro: Vibratory sense minimally decreased over the fingertips per tuning fork exam.  Skin: Erythema over the upper chest and back consistent with a sunburn. Abrasions on extremities.   Lab Results:  Lab Results  Component Value Date   WBC 5.6 11/13/2016   HGB 11.8 (L) 11/13/2016   HCT 35.1 (L) 11/13/2016   MCV 94.5 11/13/2016   PLT 136 (L) 11/13/2016   NEUTROABS 4.1 11/13/2016    Imaging:  No results found.  Medications: I have reviewed the patient's current medications.  Assessment/Plan: 1. 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 2. Hypertension. On amlodipine and lisinopril. 3. Anemia of iron deficiency. Hemoglobin remains stable. 4. Anorexia and weight loss. Weight is stable. 5. 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 6. Goals of care discussion. Nicholas Lowery has previously discussed the incurable nature of his cancer and the overall poor prognosis especially if he does not have a good response to chemotherapy or  progresses on chemotherapy. He understands the goal of care is palliative.   Disposition: Nicholas Lowery appears stable. Plan to proceed with FOLFIRINOX/Avastin today as scheduled. Due to nausea following the last cycle he will begin dexamethasone 4 mg twice daily 3 days beginning day 2. He will contact the office if this is not effective. We will add Emend with the next cycle (12/07/2016) pending insurance prior authorization. He will try Imodium or Lomotil for the loose stools.  We reviewed the plan is to obtain a restaging PET scan after completion of this cycle and one more.  He will return for a follow-up visit and chemotherapy 12/07/2016. He will contact the office in the interim as outlined above or with any other problems.  Plan reviewed with Nicholas Lowery. 25 minutes were spent face-to-face at today's visit with the majority of that time involved in counseling/coordination of care.   Nicholas Lowery ANP/GNP-BC   11/13/2016  11:20 AM

## 2016-11-15 ENCOUNTER — Ambulatory Visit (HOSPITAL_BASED_OUTPATIENT_CLINIC_OR_DEPARTMENT_OTHER): Payer: BLUE CROSS/BLUE SHIELD

## 2016-11-15 ENCOUNTER — Other Ambulatory Visit: Payer: Self-pay | Admitting: Oncology

## 2016-11-15 VITALS — BP 136/99 | HR 61 | Temp 97.7°F | Resp 20

## 2016-11-15 DIAGNOSIS — R634 Abnormal weight loss: Secondary | ICD-10-CM | POA: Diagnosis not present

## 2016-11-15 DIAGNOSIS — T451X5A Adverse effect of antineoplastic and immunosuppressive drugs, initial encounter: Principal | ICD-10-CM

## 2016-11-15 DIAGNOSIS — R630 Anorexia: Secondary | ICD-10-CM

## 2016-11-15 DIAGNOSIS — C787 Secondary malignant neoplasm of liver and intrahepatic bile duct: Secondary | ICD-10-CM

## 2016-11-15 DIAGNOSIS — Z5189 Encounter for other specified aftercare: Secondary | ICD-10-CM

## 2016-11-15 DIAGNOSIS — R110 Nausea: Secondary | ICD-10-CM

## 2016-11-15 DIAGNOSIS — C182 Malignant neoplasm of ascending colon: Secondary | ICD-10-CM | POA: Diagnosis not present

## 2016-11-15 DIAGNOSIS — C189 Malignant neoplasm of colon, unspecified: Secondary | ICD-10-CM

## 2016-11-15 MED ORDER — SODIUM CHLORIDE 0.9 % IV SOLN: 10.0000 mg | Freq: Once | INTRAVENOUS | Status: DC

## 2016-11-15 MED ORDER — SODIUM CHLORIDE 0.9 % IV SOLN
INTRAVENOUS | Status: AC
  Administered 2016-11-15: 11:00:00 via INTRAVENOUS

## 2016-11-15 MED ORDER — DEXAMETHASONE SODIUM PHOSPHATE 10 MG/ML IJ SOLN
INTRAMUSCULAR | Status: AC
  Filled 2016-11-15: qty 1

## 2016-11-15 MED ORDER — PEGFILGRASTIM INJECTION 6 MG/0.6ML ~~LOC~~
6.0000 mg | PREFILLED_SYRINGE | Freq: Once | SUBCUTANEOUS | Status: AC
  Administered 2016-11-15: 6 mg via SUBCUTANEOUS

## 2016-11-15 MED ORDER — LORAZEPAM 0.5 MG PO TABS: 0.5000 mg | ORAL_TABLET | Freq: Four times a day (QID) | ORAL | 0 refills | Status: DC | PRN

## 2016-11-15 MED ORDER — LORAZEPAM 2 MG/ML IJ SOLN
0.5000 mg | Freq: Once | INTRAMUSCULAR | Status: AC
  Administered 2016-11-15: 0.5 mg via INTRAVENOUS

## 2016-11-15 MED ORDER — LORAZEPAM 2 MG/ML IJ SOLN
INTRAMUSCULAR | Status: AC
  Filled 2016-11-15: qty 1

## 2016-11-15 NOTE — Patient Instructions (Signed)
Nausea, Adult Feeling sick to your stomach (nausea) means that your stomach is upset or you feel like you have to throw up (vomit). Feeling sick to your stomach is usually not serious, but it may be an early sign of a more serious medical problem. As you feel sicker to your stomach, it can lead to throwing up (vomiting). If you throw up, or if you are not able to drink enough fluids, there is a risk of dehydration. Dehydration can make you feel tired and thirsty, have a dry mouth, and pee (urinate) less often. Older adults and people who have other diseases or a weak defense (immune) system have a higher risk of dehydration. The main goal of treating this condition is to:  Limit how often you feel sick to your stomach.  Prevent throwing up and dehydration.  Follow these instructions at home: Follow instructions from your doctor about how to care for yourself at home. Eating and drinking Follow these recommendations as told by your doctor:  Take an oral rehydration solution (ORS). This is a drink that is sold at pharmacies and stores.  Drink clear fluids in small amounts as you are able, such as: ? Water. ? Ice chips. ? Fruit juice that has water added (diluted fruit juice). ? Low-calorie sports drinks.  Eat bland, easy to digest foods in small amounts as you are able, such as: ? Bananas. ? Applesauce. ? Rice. ? Lean meats. ? Toast. ? Crackers.  Avoid drinking fluids that contain a lot of sugar or caffeine.  Avoid alcohol.  Avoid spicy or fatty foods.  General instructions  Drink enough fluid to keep your pee (urine) clear or pale yellow.  Wash your hands often. If you cannot use soap and water, use hand sanitizer.  Make sure that all people in your household wash their hands well and often.  Rest at home while you get better.  Take over-the-counter and prescription medicines only as told by your doctor.  Breathe slowly and deeply when you feel sick to your  stomach.  Watch your condition for any changes.  Keep all follow-up visits as told by your doctor. This is important. Contact a doctor if:  You have a headache.  You have new symptoms.  You feel sicker to your stomach.  You have a fever.  You feel light-headed or dizzy.  You throw up.  You are not able to keep fluids down. Get help right away if:  You have pain in your chest, neck, arm, or jaw.  You feel very weak or you pass out (faint).  You have throw up that is bright red or looks like coffee grounds.  You have bloody or black poop (stools), or poop that looks like tar.  You have a very bad headache, a stiff neck, or both.  You have very bad pain, cramping, or bloating in your belly.  You have a rash.  You have trouble breathing or you are breathing very quickly.  Your heart is beating very quickly.  Your skin feels cold and clammy.  You feel confused.  You have pain while peeing.  You have signs of dehydration, such as: ? Dark pee, or very little or no pee. ? Cracked lips. ? Dry mouth. ? Sunken eyes. ? Sleepiness. ? Weakness. These symptoms may be an emergency. Do not wait to see if the symptoms will go away. Get medical help right away. Call your local emergency services (911 in the [place] Do not drive yourself to   the hospital. This information is not intended to replace advice given to you by your health care provider. Make sure you discuss any questions you have with your health care provider. Document Released: 04/20/2011 Document Revised: 10/07/2015 Document Reviewed: 01/05/2015 Elsevier Interactive Patient Education  2018 [employer]  

## 2016-11-15 NOTE — Progress Notes (Signed)
Patient reported to the treatment room with emesis bag actively vomiting and dry heaving. Wife present with patient. He has been sleeping, vomiting and having diarrhea the past three days. Wife reports he started vomiting 2 hours after leaving the cancer center on Monday. Patient has not had any intake. Unable to keep food or water down. Vital signs obtained. See flowsheet. On Call provider paged to discuss patient status. Per Dr. Benay Spice 5FU pump discontinued with 33.75ml remaining in bag to be wasted, 1 liter of fluid over an hour, ativan 0.5mg  IV, decadron 10mg  IV given to patient. Vitals reassessed- see flowsheet. Patient is resting without complaints. Will continue to monitor.

## 2016-11-16 ENCOUNTER — Telehealth: Payer: Self-pay | Admitting: *Deleted

## 2016-11-16 ENCOUNTER — Telehealth: Payer: Self-pay | Admitting: Hematology

## 2016-11-16 MED ORDER — DEXAMETHASONE SODIUM PHOSPHATE 10 MG/ML IJ SOLN
10.0000 mg | Freq: Once | INTRAMUSCULAR | Status: AC
  Administered 2016-11-15: 10 mg via INTRAVENOUS

## 2016-11-16 NOTE — Addendum Note (Signed)
Addended by: Tora Kindred on: 11/16/2016 07:35 AM   Modules accepted: Orders

## 2016-11-16 NOTE — Telephone Encounter (Signed)
Telephone call to patient to follow up symptoms from 7/4 pump d/c. Patient reports today he is feeling much better. He did vomit last night after arriving home from [facility] but was able to eat eggs and bacon last night for dinner. This morning after taking his pills he did become nauseated but took the new medication and was relieved. Patient reports no further nausea today and is able to drink fluids. Advised patient to call office with any concerns or questions.

## 2016-11-16 NOTE — Telephone Encounter (Signed)
Appointments scheduled and confirmed with patient, per 11/13/16 los.

## 2016-11-29 ENCOUNTER — Other Ambulatory Visit: Payer: Self-pay | Admitting: *Deleted

## 2016-11-29 DIAGNOSIS — C787 Secondary malignant neoplasm of liver and intrahepatic bile duct: Principal | ICD-10-CM

## 2016-11-29 DIAGNOSIS — C189 Malignant neoplasm of colon, unspecified: Secondary | ICD-10-CM

## 2016-11-29 MED ORDER — LISINOPRIL 10 MG PO TABS: 10.0000 mg | ORAL_TABLET | Freq: Every day | ORAL | 0 refills | Status: DC

## 2016-12-01 NOTE — Progress Notes (Signed)
[place]  [phone]) [phone] [phone]  Clinic Follow Up Note  Patient Care Team: Curlene Labrum, MD as PCP - General (Family Medicine) 12/07/2016  CHIEF COMPLAINTS:  Follow up metastatic right colon cancer   Oncology History   Cancer Staging Metastatic colon cancer to liver [facility]) Staging form: Colon and Rectum, AJCC 8th Edition - Clinical stage from 06/01/2016: Stage IVA (cTX, cNX, pM1a) - Signed by Truitt Merle, MD on 07/04/2016 - Pathologic stage from 06/14/2016: Stage IVA (pT4b(m), pN2b, pM1a) - Signed by Truitt Merle, MD on 07/04/2016       Metastatic colon cancer to liver ([place])   04/2015 Procedure    Colonoscopy by Dr. Ladona Horns. It showed showed 2 sessile polyps ready between 3-5 mm in size located 20 cm (A, B) from the point of entry, polypectomy was performed. Pedunculated polyp was found in the ascending colon (C), polypectomy was performed, and additional polyp (D) was found 30 cm from the point of entry, removed      04/2015 Pathology Results    tubular adenoma (A and B), and well differentiated adenocarcinoma arising from tubulovillous adenoma (C) and well differentiated adenocarcinoma arising from severe dysplasia to intramucosal carcinoma within tubular adenoma.       04/2015 Initial Diagnosis    Metastatic colon cancer to liver ([place])      05/29/2016 Imaging    CT abdomen and pelvis with contrast showed an apple core like stricture in right colon just above the cecum, measuring 3.2 cm in lengths. This is highly suspicious for malignancy. Small lymph node a noticed he had adjacent mesentery, measuring 8 mm. There is a low-density lesion within the inferior aspect of the right hepatic lobe measuring 1.7 cm, suspicious for metastasis.       06/06/2016 Tumor Marker    CEA 9.99      06/08/2016 PET scan    IMPRESSION: Approximately 3 cm hypermetabolic mass in the ascending colon, consistent with primary colon carcinoma. This mass results in  colonic obstruction and small bowel dilatation. Additional areas of hypermetabolic wall thickening in the cecum may represent other sites of colon carcinoma or colitis. Mild hypermetabolic lymphadenopathy in right pericolonic region, porta hepatis, and aortocaval space, consistent with metastatic disease. Mild hypermetabolic mediastinal lymphadenopathy also seen, and thoracic lymph node metastases cannot be excluded. Solitary hypermetabolic focus in inferior right hepatic lobe, consistent with liver metastasis. Consider abdomen MRI without and with contrast for further evaluation.      06/14/2016 Surgery    Hand assisted right hemicolectomy and small bowel resection for colon cancer, liver biopsy, by Dr. Barry Dienes      06/14/2016 Pathology Results    Right hemicolectomy showed invasive well to moderately differentiated adenocarcinoma, 2 foci measuring 7.5 cm and 4.5 cm, tumor invades through full thickness of colon, to the seroma and involve the Small Bowel, Surgical Margins Were Negative, 24 Out Of 64 Lymph Nodes Were Positive, Extracapsular Extension Identified, Multiple Satellite Tumor Deposits Present, Liver Biopsy Showed Metastatic Adenocarcinoma.        06/14/2016 Miscellaneous    Tumor MMR normal, MSI stable       06/14/2016 Miscellaneous    Foundation one genomic testing showed K-ras G12 D mutation, APC and TP53 mutation. No BRAF and NRAS mutation. MSI-stable, tumor burden low.      07/06/2016 Tumor Marker    CEA 13.69      07/13/2016 -  Chemotherapy    mFOLFOX, every 2 weeks, started on 07/14/2015, Avastin added from  cycle 3  Oxaliplatin dose to '60mg'$ /m2 due to side effects and some cytopenia on 09/21/16  Changed to FOLFIRINOX starting cycle 7 and Reduced  Due to neuropathy hold Oxaliplatin and add Irinotecan with neulasta on day 3 starting with cycle 7 Add low dose Oxaliplatin with cycle 8.         07/27/2016 Tumor Marker    CEA 15.85      09/04/2016 Imaging    Ct C/A/P W  Contrast IMPRESSION: Interval right colectomy. Stable small liver metastasis in the inferior right hepatic lobe. Stable mild porta hepatis and aortocaval lymphadenopathy. Stable mild mediastinal lymphadenopathy. No new or progressive metastatic disease identified within the chest, abdomen, or pelvis.       HISTORY OF PRESENTING ILLNESS (06/01/2016):  Nicholas Lowery 56 y.o. male is here because of his recently abdominal CT which is highly suspicious for metastatic colon cancer. He is accompanied by his wife to my clinic today. He was referred by his primary care physician Dr. Pleas Koch.   He had colonoscopy in 2016 for mild rectal bleeding, he describe small amount fresh blood mixed with stool, he has no other constitutional symptoms at that time. He was referred to gastroenterologist Dr. Ladona Horns and underwent a colonoscopy in December 2016. The colonoscopy showed 2 sessile polyps ready between 3-5 mm in size located 20 cm (A, B) from the point of entry, polypectomy was performed. Pedunculated polyp was found in the ascending colon (C), polypectomy was performed, and additional polyp (D) was found 30 cm from the point of entry, removed. The pathology reviewed tubular adenoma (A and B), and well differentiated adenocarcinoma arising from tubulovillous adenoma (C) and well differentiated adenocarcinoma arising from severe dysplasia to intramucosal carcinoma within tubular adenoma. Dr. Tye Maryland tried multiple times to reach patient, but patient sought they were calling him about the bill, and did not return the phone calls. He was not aware the cancer diagnosis until recently.   He started having diarrhea and vomiting in mid Dec 2017, and felt a "pop" in right side abdomen, he was seen at urgent care, and was treated with antiemetics, and IVF, lab tests were OK. Due to his persistent intermittent diarrhea and epigastric pain since then, he was seen by PCP and he eventually had CT abdomen and pelvis scan which  showed a upper core lesion in the ascending colon, and I'll 1.7 cm lesion in the liver, highly suspicious for metastasis. He was referred to [place] for further evaluation.  He has lost 30 lbs in the past one month, has low appetite, eats a small meals 1-2 times a day. He has moderate fatigue, able to tolerate routine activities including his work, but feels exhausted at the end of study. He has occasional constipation, denies recent rectal bleeding.  CURRENT THERAPY: mFOLFOX, every 2 weeks, started on 07/14/2015, Avastin added from cycle 3. Lower dose of Oxaliplatin 09/21/16. Changed to FOLFIRINOX on 10/05/16,  Add neulasta day 3 starting with cycle 7  INTERIM HISTORY:  Nicholas Lowery returns today for follow up. He presents to the clinic today with his wife. He reports his trip to [place] was good. 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 His neuropathy of feet is still consistent. He can feel his feet but he has tingling and in his fingers.   He has issues of severe nausea and vomiting. His wife reports he started vomiting right after his treatment ended in clinic. On the 4th of July was was really sick and could  not keep any medication down. He also got sick after taking compazine.  He did start taking dexamethasone and they cannot tell if that helped.  His wife reports when he goes home with the pump he gets really depressed and does not eat and just sleeps. He reports he sykes himself out when he comes to the clinic.  They wanted to know all pathways to cure his cancer through surgery or ablation.   Marland Kitchen  MEDICAL HISTORY:  Past Medical History:  Diagnosis Date  . Anxiety   . Cancer of ascending colon (HCC)   . Seasonal allergies     SURGICAL HISTORY: Past Surgical History:  Procedure Laterality Date  . APPENDECTOMY  1992  . CATARACT EXTRACTION W/ INTRAOCULAR LENS IMPLANT Left 04/2016  . COLON SURGERY    . COLONOSCOPY W/ BIOPSIES AND POLYPECTOMY  04/2015  . INGUINAL HERNIA REPAIR  Right 1982  . KNEE ARTHROSCOPY W/ MENISCECTOMY Right 1998  . LAPAROSCOPIC RIGHT COLECTOMY N/A 06/14/2016   Procedure: LAPAROSCOPIC HAND ASSISTED HEMICOLECTOMY AND SMALL BOWEL RESECTION.;  Surgeon: Almond Lint, MD;  Location: [facility] OR;  Service: General;  Laterality: N/A;  . LIVER BIOPSY Right 06/14/2016   Procedure: LIVER BIOPSY;  Surgeon: Almond Lint, MD;  Location: [facility] OR;  Service: General;  Laterality: Right;  Right Inferior Liver  . PORTACATH PLACEMENT N/A 07/06/2016   Procedure: INSERTION PORT-A-CATH;  Surgeon: Almond Lint, MD;  Location: [facility];  Service: General;  Laterality: N/A;  . VASECTOMY  2005    SOCIAL HISTORY: Social History   Social History  . Marital status: Married    Spouse name: N/A  . Number of children: N/A  . Years of education: N/A   Occupational History  . Not on file.   Social History Main Topics  . Smoking status: Former Smoker    Years: 2.00    Types: Cigarettes    Quit date: 37  . Smokeless tobacco: Never Used  . Alcohol use 2.4 oz/week    4 Cans of beer per week     Comment: social, none since colon surgery  . Drug use: No     Comment: 06/15/2016 "nothing since college"  . Sexual activity: Yes   Other Topics Concern  . Not on file   Social History Narrative  . No narrative on file   He is married.  They have 3 boys, 65-12 yo. 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   FAMILY HISTORY: Family History  Problem Relation Age of Onset  . Cancer Mother        lung cancer  . Stroke Mother   . Hypertension Father   . CAD Father   . Cancer Maternal Grandfather        prostate cancer     ALLERGIES:  is allergic to penicillins.  MEDICATIONS:  Current Outpatient Prescriptions  Medication Sig Dispense Refill  . amLODipine (NORVASC) 10 MG tablet Take 1 tablet (10 mg total) by mouth daily. 30 tablet 2  . celecoxib (CELEBREX) 200 MG capsule Take 200 mg by mouth daily.  1  . dexamethasone (DECADRON) 4 MG tablet Take 1  tablet (4 mg total) by mouth 2 (two) times daily. For three days after chemo (begin day 2) 6 tablet 0  . FLUoxetine (PROZAC) 40 MG capsule Take 40 mg by mouth daily.    Marland Kitchen lidocaine-prilocaine (EMLA) cream Apply to affected area once 30 g 3  . lisinopril (PRINIVIL) 10 MG tablet Take 1 tablet (10 mg  total) by mouth daily. 90 tablet 0  . LORazepam (ATIVAN) 0.5 MG tablet Take 1 tablet (0.5 mg total) by mouth every 6 (six) hours as needed (for nausea). 20 tablet 0  . ondansetron (ZOFRAN ODT) 8 MG disintegrating tablet Take 1 tablet (8 mg total) by mouth every 8 (eight) hours as needed for nausea or vomiting. 30 tablet 2  . prochlorperazine (COMPAZINE) 10 MG tablet Take 1 tablet (10 mg total) by mouth every 6 (six) hours as needed for nausea or vomiting. 30 tablet 2  . diphenoxylate-atropine (LOMOTIL) 2.5-0.025 MG tablet TAKE TWO TABLETS BY MOUTH FOUR TIMES DAILY AS NEEDED FOR FOR DIARRHEA OR loose stools  0  . docusate sodium (COLACE) 100 MG capsule Take 100 mg by mouth as needed.     . traMADol (ULTRAM) 50 MG tablet Take 1-2 tablets (50-100 mg total) by mouth every 6 (six) hours as needed for moderate pain or severe pain. (Patient not taking: Reported on 11/13/2016) 30 tablet 0   No current facility-administered medications for this visit.    Facility-Administered Medications Ordered in Other Visits  Medication Dose Route Frequency Provider Last Rate Last Dose  . 0.9 %  sodium chloride infusion   Intravenous Continuous Truitt Merle, MD 50 mL/hr at 12/07/16 1014    . clindamycin (CLEOCIN) 900 mg in dextrose 5 % 50 mL IVPB  900 mg Intravenous 60 min Pre-Op [name], MD       And  . gentamicin (GARAMYCIN) 450 mg in dextrose 5 % 50 mL IVPB  5 mg/kg Intravenous 60 min Pre-Op [name], MD      . dextrose 5 % solution   Intravenous Once Truitt Merle, MD      . fluorouracil (ADRUCIL) 5,000 mg in sodium chloride 0.9 % 150 mL chemo infusion  5,000 mg Intravenous 1 day or 1 dose Truitt Merle, MD      . heparin  lock flush 100 unit/mL  500 Units Intracatheter Once PRN Truitt Merle, MD      . leucovorin 840 mg in dextrose 5 % 250 mL infusion  400 mg/m2 (Treatment Plan Recorded) Intravenous Once Truitt Merle, MD 146 mL/hr at 12/07/16 1318 840 mg at 12/07/16 1318  . oxaliplatin (ELOXATIN) 125 mg in dextrose 5 % 500 mL chemo infusion  60 mg/m2 (Treatment Plan Recorded) Intravenous Once Truitt Merle, MD 263 mL/hr at 12/07/16 1317 125 mg at 12/07/16 1317  . sodium chloride flush (NS) 0.9 % injection 10 mL  10 mL Intracatheter PRN Truitt Merle, MD        REVIEW OF SYSTEMS:  Constitutional: Denies fevers, chills or abnormal night sweats  (+) taste changes, metallic taste sensation Eyes: Denies blurriness of vision, double vision or watery eyes Ears, nose, mouth, throat, and face: Denies mucositis or sore throat (+) jaw pain and stiffness Respiratory: Denies cough, dyspnea or wheezes Cardiovascular: Denies palpitation, chest discomfort or lower extremity swelling Gastrointestinal:  Denies heartburn or change in bowel habits (+) episodes of severe nausea/vomiting Skin: (+) bilateral upper extremity rash (+) skin tag on left cheek  Lymphatics: Denies new lymphadenopathy or easy bruising Neurological: (+) neuropathy in fingers, more tingling in feet MSK: Muscle weakness in hands Behavioral/Psych: Mood is stable, no new changes (+) depression/anxiety All other systems were reviewed with the patient and are negative.  PHYSICAL EXAMINATION:  ECOG PERFORMANCE STATUS: 1 - Symptomatic but completely ambulatory  Vitals:   12/07/16 0907  BP: (!) 123/96  Pulse: 71  Resp: 19  Temp: 98.7  F (37.1 C)  TempSrc: Oral  SpO2: 100%  Weight: 199 lb 3.2 oz (90.4 kg)  Height: '5\' 11"'$  (1.803 m)   . GENERAL:alert, no distress and comfortable. SKIN: skin color, texture, turgor are normal, no rashes or significant lesions EYES: normal, conjunctiva are pink and non-injected, sclera clear OROPHARYNX:no exudate, no erythema and lips,  buccal mucosa, and tongue normal  NECK: supple, thyroid normal size, non-tender, without nodularity LYMPH:  no palpable lymphadenopathy in the cervical, axillary or inguinal LUNGS: clear to auscultation and percussion with normal breathing effort HEART: regular rate & rhythm and no murmurs and no lower extremity edema ABDOMEN:abdomen soft, Surgical scar in the midline around the umbilical has healed well. non-tender and normal bowel sounds Musculoskeletal:no cyanosis of digits and no clubbing  PSYCH: alert & oriented x 3 with fluent speech NEURO: no focal motor/sensory deficits (+) bilateral mild decreased vibration sensation in feet  LABORATORY DATA:  I have reviewed the data as listed CBC Latest Ref Rng & Units 12/07/2016 11/13/2016 10/26/2016  WBC 4.0 - 10.3 10e3/uL 3.6(L) 5.6 3.6(L)  Hemoglobin 13.0 - 17.1 g/dL 12.1(L) 11.8(L) 12.6(L)  Hematocrit 38.4 - 49.9 % 37.3(L) 35.1(L) 36.9(L)  Platelets 140 - 400 10e3/uL 136(L) 136(L) 175   CMP Latest Ref Rng & Units 12/07/2016 11/13/2016 10/26/2016  Glucose 70 - 140 mg/dl 113 97 95  BUN 7.0 - 26.0 mg/dL 16.8 11.7 14.1  Creatinine 0.7 - 1.3 mg/dL 1.2 1.1 1.2  Sodium 136 - 145 mEq/L 139 141 140  Potassium 3.5 - 5.1 mEq/L 4.3 4.6 4.6  Chloride 101 - 111 mmol/L - - -  CO2 22 - 29 mEq/L '24 25 24  '$ Calcium 8.4 - 10.4 mg/dL 9.3 9.2 9.6  Total Protein 6.4 - 8.3 g/dL 6.9 6.8 7.0  Total Bilirubin 0.20 - 1.20 mg/dL 0.59 0.32 0.38  Alkaline Phos 40 - 150 U/L 65 74 58  AST 5 - 34 U/L '20 18 18  '$ ALT 0 - 55 U/L '24 16 15    '$ ANC 2.3K today   PATHOLOGY   Diagnosis 06/14/2016 1. Colon, segmental resection for tumor, Right Ascending Hemicolectomy and Small Bowel - INVASIVE WELL TO MODERATELY DIFFERENTIATED ADENOCARCINOMA. - TWO TUMOR FOCI MEASURING 7.5 CM AND 4.5 CM IN GREATEST DIMENSION. - TUMOR INVADES THROUGH FULL THICKNESS OF COLON, THROUGH THE SEROSA TO INVOLVE THE SMALL BOWEL. - MARGINS ARE NEGATIVE. - TWENTY FOUR OF SIXTY FOUR LYMPH NODES POSITIVE FOR  METASTATIC ADENOCARCINOMA (24/64). - EXTRACAPSULAR EXTENSION IDENTIFIED - MULTIPLE SATELLITE TUMOR DEPOSITS PRESENT. - SEE ONCOLOGY TEMPLATE. 2. Liver, needle/core biopsy, Right Inferior - POSITIVE FOR METASTATIC ADENOCARCINOMA.    RADIOGRAPHIC STUDIES: I have personally reviewed his outside CT scan from 05/29/2016 and agreed with the findings in the report. No results found.  CT CAP 09/04/16 IMPRESSION: Interval right colectomy. Stable small liver metastasis in the inferior right hepatic lobe. Stable mild porta hepatis and aortocaval lymphadenopathy. Stable mild mediastinal lymphadenopathy. No new or progressive metastatic disease identified within the chest, abdomen, or pelvis.  ASSESSMENT & PLAN:  56 y.o. Caucasian male, without significant past medical history, presented with rectal bleeding in December 2016, colonoscopy showed 4 polyps, 2 of them showed invasive adenocarcinoma, unfortunately he was not aware of the pathology findings and was not treated. He now presented with fatigue, weight loss, anorexia and abdominal pain  1. Right colon cancer with liver and node metastasis, pT4bN2bM1a, stage IV, MSI-stable  -I previously reviewed her PET scan findings, which showed solitary liver metastasis, abdominal and possible thoracic  node metastasis  -His liver biopsy confirmed metastasis -Due to the bowel obstruction, he underwent upfront hemicolectomy. I previously reviewed his surgical pathology findings, which showed 2 primary right colon cancer, very locally advanced with 24 lymph nodes positive, surgical margins were negative. -I previously discussed his Foundation one genomic testing results, which showed care arrest mutation, and as high stable, low tumor burden. So he would not benefit from EGFR inhibitor, or immunotherapy alone. -The patient has started first-line chemotherapy FOLFOX. Avastin was added from cycle 3 -The goal for chemotherapy is palliative, giving his  metastatic disease. -He has solitary liver metastasis, but multiple mild hypermetabolic adenopathy in mediastinum and abdomen. We previously discussed the possible local therapy options, such as liver metastectomy or ablation if he has great response to chemo and his node metastasis resolves  -restaging CT from 09/04/2016 reviewed with pt, stable disease. Will continue chemo -he has developed more side effects from chemo FOLFOX, and a worsening thrombocytopenia, despite reduced dose of oxaliplatin  -Due to his overall limited response to FOLFOX, and his wish to pursue surgery, I previously recommend him to change chemo to FOLFIRINOX with low dose oxaliplatin, hopefully he will response better. He agreed -Due to his severe nausea and vomiting, anorexia, mild diarrhea, I'll reduce his irinotecan dose from cycle 10 -I will repeat PET scan for restaging purposes. He may be a candidate for liver surgery or ablation if he has excellent response to chemo and nodes become negative on next PET. -We also discussed the escalating his chemotherapy after liver targeted therapy, such as 5-FU or Xeloda maintenance therapy with Avastin -labs reviewed and his  platelet counts are adequate for treatment today and he is slightly anemic. His CEA has decreased to normal range lately  -F/u in 2 weeks to discuss PET scan    2. Hypertension -His blood pressure has been high lately, I have previously given him clonidine 0.1 mg in the infusion room before Avastin -We previously discussed Avastin can cause hypertension -I have started him on amlodipine, and increase dose to 10 million daily -He does not want to follow-up with his primary care physician. -His blood picture remains to be high, especially diastolic blood pressure. I added on lisinopril 10 mg daily for him -Change Amlodipine prescription to '10mg'$     3. Anemia of iron deficiency and chemo  -He has mild anemia, likely related to his colon cancer bleeding. -His  HGB was low following surgery. I have order a repeat lab. -I previously recommend him to take oral iron supplement over-the-counter, potential side effects of constipation and gastric or discomfort or discussed with him. He stopped taking this before surgery. -I previously advised him to begin taking oral iron supplements again. - Getting better ,11.3 previously. - Iron levels previously on 08/24/16 at 46. - We will continue to monitor iron levels   4. Anorexia and weight loss -Secondary to underlying malignancy -I previously encouraged him to try nutritional supplement, such as boost or initial -The patient has previously gained some weight .  5. Peripheral neuropathy, G1-2 -Secondary to oxaliplatin, dose reduced -Overall mild and stable. Continue observation  6. Goal of care discussion  -We previously discussed the incurable nature of his cancer, and the overall poor prognosis, especially if he does not have good response to chemotherapy or progress on chemo -The patient understands the goal of care is palliative.  -He is full code now   7. Skin tag on left cheek -Darkened due to side effect of chemo -  He can get it removed but when blood counts are normal  -I encouraged him to be cautious of skin discoloration and sunscreen  8. Episodes of Sever nausea and vomiting secondary to chemo  -Due to nausea and vomiting we add IV emend before chemo -His nausea may also be psychological -I suggest he to take dexa '4mg'$  daily for 5 days after chemo, in addition to Zofran, Compazine and Ativan   Plan  -Lab reviewed, adequate for treatment, we'll continue chemotherapy FOLFIRINOX with reduced dose of oxaliplatin and irinotecan , continue Avastin  Please schedule PET scan a few days before next visit in 2 weeks -Lab, flush, chemo FOFIRINOX and avastin in 4 and 6 weeks  -F/u in 2 weeks     No orders of the defined types were placed in this encounter.   All questions were answered.   The  patient knows to call the clinic with any problems, questions or concerns. I spent 25 minutes counseling the patient face to face. The total time spent in the appointment was 30 minutes and more than 50% was on counseling.  This document serves as a record of services personally performed by Truitt Merle, MD. It was created on her behalf by Joslyn Devon, a trained medical scribe. The creation of this record is based on the scribe's personal observations and the provider's statements to them. This document has been checked and approved by the attending provider. Truitt Merle, MD 12/07/2016

## 2016-12-04 ENCOUNTER — Ambulatory Visit: Payer: BLUE CROSS/BLUE SHIELD

## 2016-12-04 ENCOUNTER — Other Ambulatory Visit: Payer: Self-pay | Admitting: Hematology

## 2016-12-04 ENCOUNTER — Ambulatory Visit: Payer: BLUE CROSS/BLUE SHIELD | Admitting: Hematology

## 2016-12-04 ENCOUNTER — Other Ambulatory Visit: Payer: BLUE CROSS/BLUE SHIELD

## 2016-12-07 ENCOUNTER — Ambulatory Visit (HOSPITAL_BASED_OUTPATIENT_CLINIC_OR_DEPARTMENT_OTHER): Payer: BLUE CROSS/BLUE SHIELD

## 2016-12-07 ENCOUNTER — Encounter: Payer: Self-pay | Admitting: Hematology

## 2016-12-07 ENCOUNTER — Ambulatory Visit: Payer: BLUE CROSS/BLUE SHIELD | Admitting: Nutrition

## 2016-12-07 ENCOUNTER — Ambulatory Visit (HOSPITAL_BASED_OUTPATIENT_CLINIC_OR_DEPARTMENT_OTHER): Payer: BLUE CROSS/BLUE SHIELD | Admitting: Hematology

## 2016-12-07 ENCOUNTER — Other Ambulatory Visit (HOSPITAL_BASED_OUTPATIENT_CLINIC_OR_DEPARTMENT_OTHER): Payer: BLUE CROSS/BLUE SHIELD

## 2016-12-07 VITALS — BP 123/96 | HR 71 | Temp 98.7°F | Resp 19 | Ht 71.0 in | Wt 199.2 lb

## 2016-12-07 DIAGNOSIS — C189 Malignant neoplasm of colon, unspecified: Secondary | ICD-10-CM

## 2016-12-07 DIAGNOSIS — D509 Iron deficiency anemia, unspecified: Secondary | ICD-10-CM

## 2016-12-07 DIAGNOSIS — D6481 Anemia due to antineoplastic chemotherapy: Secondary | ICD-10-CM

## 2016-12-07 DIAGNOSIS — C182 Malignant neoplasm of ascending colon: Secondary | ICD-10-CM | POA: Diagnosis not present

## 2016-12-07 DIAGNOSIS — Z5111 Encounter for antineoplastic chemotherapy: Secondary | ICD-10-CM

## 2016-12-07 DIAGNOSIS — Z5112 Encounter for antineoplastic immunotherapy: Secondary | ICD-10-CM | POA: Diagnosis not present

## 2016-12-07 DIAGNOSIS — I10 Essential (primary) hypertension: Secondary | ICD-10-CM

## 2016-12-07 DIAGNOSIS — G620 Drug-induced polyneuropathy: Secondary | ICD-10-CM

## 2016-12-07 DIAGNOSIS — Z452 Encounter for adjustment and management of vascular access device: Secondary | ICD-10-CM | POA: Diagnosis not present

## 2016-12-07 DIAGNOSIS — D500 Iron deficiency anemia secondary to blood loss (chronic): Secondary | ICD-10-CM

## 2016-12-07 DIAGNOSIS — C787 Secondary malignant neoplasm of liver and intrahepatic bile duct: Secondary | ICD-10-CM

## 2016-12-07 DIAGNOSIS — Z95828 Presence of other vascular implants and grafts: Secondary | ICD-10-CM

## 2016-12-07 LAB — COMPREHENSIVE METABOLIC PANEL
ALBUMIN: 3.8 g/dL (ref 3.5–5.0)
ALK PHOS: 65 U/L (ref 40–150)
ALT: 24 U/L (ref 0–55)
ANION GAP: 9 meq/L (ref 3–11)
AST: 20 U/L (ref 5–34)
BILIRUBIN TOTAL: 0.59 mg/dL (ref 0.20–1.20)
BUN: 16.8 mg/dL (ref 7.0–26.0)
CALCIUM: 9.3 mg/dL (ref 8.4–10.4)
CO2: 24 mEq/L (ref 22–29)
Chloride: 106 mEq/L (ref 98–109)
Creatinine: 1.2 mg/dL (ref 0.7–1.3)
EGFR: 72 mL/min/{1.73_m2} — AB (ref >90)
GLUCOSE: 113 mg/dL (ref 70–140)
POTASSIUM: 4.3 meq/L (ref 3.5–5.1)
Sodium: 139 mEq/L (ref 136–145)
TOTAL PROTEIN: 6.9 g/dL (ref 6.4–8.3)

## 2016-12-07 LAB — CEA (IN HOUSE-CHCC): CEA (CHCC-In House): 3.66 ng/mL (ref 0.00–5.00)

## 2016-12-07 LAB — IRON AND TIBC
%SAT: 35 % (ref 20–55)
Iron: 127 ug/dL (ref 42–163)
TIBC: 364 ug/dL (ref 202–409)
UIBC: 237 ug/dL (ref 117–376)

## 2016-12-07 LAB — CBC WITH DIFFERENTIAL/PLATELET
BASO%: 0.8 % (ref 0.0–2.0)
BASOS ABS: 0 10*3/uL (ref 0.0–0.1)
EOS ABS: 0.1 10*3/uL (ref 0.0–0.5)
EOS%: 1.4 % (ref 0.0–7.0)
HEMATOCRIT: 37.3 % — AB (ref 38.4–49.9)
HEMOGLOBIN: 12.1 g/dL — AB (ref 13.0–17.1)
LYMPH%: 19.6 % (ref 14.0–49.0)
MCH: 32.1 pg (ref 27.2–33.4)
MCHC: 32.4 g/dL (ref 32.0–36.0)
MCV: 98.9 fL — AB (ref 79.3–98.0)
MONO#: 0.5 10*3/uL (ref 0.1–0.9)
MONO%: 14.5 % — AB (ref 0.0–14.0)
NEUT%: 63.7 % (ref 39.0–75.0)
NEUTROS ABS: 2.3 10*3/uL (ref 1.5–6.5)
PLATELETS: 136 10*3/uL — AB (ref 140–400)
RBC: 3.77 10*6/uL — ABNORMAL LOW (ref 4.20–5.82)
RDW: 16.2 % — AB (ref 11.0–14.6)
WBC: 3.6 10*3/uL — AB (ref 4.0–10.3)
lymph#: 0.7 10*3/uL — ABNORMAL LOW (ref 0.9–3.3)

## 2016-12-07 LAB — FERRITIN: FERRITIN: 96 ng/mL (ref 22–316)

## 2016-12-07 MED ORDER — PALONOSETRON HCL INJECTION 0.25 MG/5ML
INTRAVENOUS | Status: AC
  Filled 2016-12-07: qty 5

## 2016-12-07 MED ORDER — SODIUM CHLORIDE 0.9 % IV SOLN
Freq: Once | INTRAVENOUS | Status: AC
  Administered 2016-12-07: 11:00:00 via INTRAVENOUS
  Filled 2016-12-07: qty 5

## 2016-12-07 MED ORDER — LEUCOVORIN CALCIUM INJECTION 350 MG
400.0000 mg/m2 | Freq: Once | INTRAMUSCULAR | Status: AC
  Administered 2016-12-07: 840 mg via INTRAVENOUS
  Filled 2016-12-07: qty 42

## 2016-12-07 MED ORDER — SODIUM CHLORIDE 0.9% FLUSH
10.0000 mL | INTRAVENOUS | Status: DC | PRN
  Filled 2016-12-07: qty 10

## 2016-12-07 MED ORDER — DEXTROSE 5 % IV SOLN
160.0000 mg/m2 | Freq: Once | INTRAVENOUS | Status: AC
  Administered 2016-12-07: 340 mg via INTRAVENOUS
  Filled 2016-12-07: qty 15

## 2016-12-07 MED ORDER — DEXAMETHASONE 4 MG PO TABS: ORAL_TABLET | ORAL | 0 refills | Status: DC

## 2016-12-07 MED ORDER — SODIUM CHLORIDE 0.9% FLUSH
10.0000 mL | Freq: Once | INTRAVENOUS | Status: AC
  Administered 2016-12-07: 10 mL
  Filled 2016-12-07: qty 10

## 2016-12-07 MED ORDER — PALONOSETRON HCL INJECTION 0.25 MG/5ML
0.2500 mg | Freq: Once | INTRAVENOUS | Status: AC
  Administered 2016-12-07: 0.25 mg via INTRAVENOUS

## 2016-12-07 MED ORDER — BEVACIZUMAB CHEMO INJECTION 400 MG/16ML
5.0000 mg/kg | Freq: Once | INTRAVENOUS | Status: AC
  Administered 2016-12-07: 450 mg via INTRAVENOUS
  Filled 2016-12-07: qty 16

## 2016-12-07 MED ORDER — ATROPINE SULFATE 1 MG/ML IJ SOLN
0.4000 mg | Freq: Once | INTRAMUSCULAR | Status: AC
  Administered 2016-12-07: 0.4 mg via INTRAVENOUS

## 2016-12-07 MED ORDER — SODIUM CHLORIDE 0.9 % IV SOLN
INTRAVENOUS | Status: DC
  Administered 2016-12-07: 10:00:00 via INTRAVENOUS

## 2016-12-07 MED ORDER — DEXTROSE 5 % IV SOLN
Freq: Once | INTRAVENOUS | Status: AC
  Administered 2016-12-07: 13:00:00 via INTRAVENOUS

## 2016-12-07 MED ORDER — ATROPINE SULFATE 1 MG/ML IJ SOLN
INTRAMUSCULAR | Status: AC
  Filled 2016-12-07: qty 1

## 2016-12-07 MED ORDER — SODIUM CHLORIDE 0.9 % IV SOLN
5000.0000 mg | INTRAVENOUS | Status: DC
  Administered 2016-12-07: 5000 mg via INTRAVENOUS
  Filled 2016-12-07: qty 100

## 2016-12-07 MED ORDER — OXALIPLATIN CHEMO INJECTION 100 MG/20ML
60.0000 mg/m2 | Freq: Once | INTRAVENOUS | Status: AC
  Administered 2016-12-07: 125 mg via INTRAVENOUS
  Filled 2016-12-07: qty 10

## 2016-12-07 MED ORDER — HEPARIN SOD (PORK) LOCK FLUSH 100 UNIT/ML IV SOLN
500.0000 [IU] | Freq: Once | INTRAVENOUS | Status: DC | PRN
  Filled 2016-12-07: qty 5

## 2016-12-07 NOTE — Patient Instructions (Signed)
[facility] Discharge Instructions for Patients Receiving Chemotherapy  Today you received the following chemotherapy agents avastin/irinotecan/oxaliplatin/leucovorin/florouracil  To help prevent nausea and vomiting after your treatment, we encourage you to take your nausea medication as directed If you develop nausea and vomiting that is not controlled by your nausea medication, call the clinic.   BELOW ARE SYMPTOMS THAT SHOULD BE REPORTED IMMEDIATELY:  *FEVER GREATER THAN 100.5 F  *CHILLS WITH OR WITHOUT FEVER  NAUSEA AND VOMITING THAT IS NOT CONTROLLED WITH YOUR NAUSEA MEDICATION  *UNUSUAL SHORTNESS OF BREATH  *UNUSUAL BRUISING OR BLEEDING  TENDERNESS IN MOUTH AND THROAT WITH OR WITHOUT PRESENCE OF ULCERS  *URINARY PROBLEMS  *BOWEL PROBLEMS  UNUSUAL RASH Items with * indicate a potential emergency and should be followed up as soon as possible.  Feel free to call the clinic you have any questions or concerns. The clinic phone number is [phone].  Please show the [facility] at check-in to the Emergency Department and triage nurse.

## 2016-12-07 NOTE — Progress Notes (Signed)
Nutrition follow-up completed with patient during chemotherapy for metastatic colon cancer. Weight was documented as 199 pounds on July 26 decreased slightly from 201 pounds in May 2018 Patient reports he continues to have some nausea. He experiences neuropathy on a regular basis. Weight is stable overall. No nutrition questions at this time.  Nutrition diagnosis: Food and nutrition related knowledge deficit resolved.  Encouraged patient to continue strategies for improved nausea. Recommended weight stabilization by consuming high-calorie high-protein foods. Referral to RN for Aromatherapy.  Patient has my contact information for questions or concerns.  **Disclaimer: This note was dictated with voice recognition software. Similar sounding words can inadvertently be transcribed and this note may contain transcription errors which may not have been corrected upon publication of note.**

## 2016-12-07 NOTE — Progress Notes (Signed)
Called pt on cell phone and left message on voice mail re:  Pt to start Dexamethasone 4 mg po daily x 5 days - starting day 2 after chemo which is  12/08/16.

## 2016-12-07 NOTE — Progress Notes (Signed)
Barb Neff/Dietician here to speak with patient.  Patient given aromatherapy/nasal inhalers - peppermint and ginger/orange.  Also given written and verbal instructions on use to patient and wife.

## 2016-12-09 ENCOUNTER — Ambulatory Visit: Payer: BLUE CROSS/BLUE SHIELD

## 2016-12-09 ENCOUNTER — Ambulatory Visit (HOSPITAL_BASED_OUTPATIENT_CLINIC_OR_DEPARTMENT_OTHER): Payer: BLUE CROSS/BLUE SHIELD

## 2016-12-09 VITALS — BP 118/78 | HR 82 | Temp 97.6°F | Resp 18

## 2016-12-09 DIAGNOSIS — C182 Malignant neoplasm of ascending colon: Secondary | ICD-10-CM

## 2016-12-09 DIAGNOSIS — C189 Malignant neoplasm of colon, unspecified: Secondary | ICD-10-CM

## 2016-12-09 DIAGNOSIS — Z5189 Encounter for other specified aftercare: Secondary | ICD-10-CM | POA: Diagnosis not present

## 2016-12-09 DIAGNOSIS — C787 Secondary malignant neoplasm of liver and intrahepatic bile duct: Secondary | ICD-10-CM

## 2016-12-09 MED ORDER — HEPARIN SOD (PORK) LOCK FLUSH 100 UNIT/ML IV SOLN
500.0000 [IU] | Freq: Once | INTRAVENOUS | Status: AC | PRN
  Administered 2016-12-09: 500 [IU]
  Filled 2016-12-09: qty 5

## 2016-12-09 MED ORDER — SODIUM CHLORIDE 0.9% FLUSH
10.0000 mL | INTRAVENOUS | Status: DC | PRN
  Administered 2016-12-09: 10 mL
  Filled 2016-12-09: qty 10

## 2016-12-09 MED ORDER — PEGFILGRASTIM INJECTION 6 MG/0.6ML ~~LOC~~
6.0000 mg | PREFILLED_SYRINGE | Freq: Once | SUBCUTANEOUS | Status: AC
  Administered 2016-12-09: 6 mg via SUBCUTANEOUS

## 2016-12-09 NOTE — Progress Notes (Signed)
Injection encounter. documented on Pump D/C encounter.

## 2016-12-16 ENCOUNTER — Encounter: Payer: Self-pay | Admitting: Hematology

## 2016-12-19 ENCOUNTER — Encounter (HOSPITAL_COMMUNITY)
Admission: RE | Admit: 2016-12-19 | Discharge: 2016-12-19 | Disposition: A | Discharge status: Home / Self Care | Payer: BLUE CROSS/BLUE SHIELD | Source: Ambulatory Visit | Attending: Nurse Practitioner | Admitting: Nurse Practitioner

## 2016-12-19 DIAGNOSIS — C787 Secondary malignant neoplasm of liver and intrahepatic bile duct: Secondary | ICD-10-CM | POA: Insufficient documentation

## 2016-12-19 DIAGNOSIS — C189 Malignant neoplasm of colon, unspecified: Secondary | ICD-10-CM | POA: Diagnosis present

## 2016-12-19 LAB — GLUCOSE, CAPILLARY: Glucose-Capillary: 90 mg/dL (ref 65–99)

## 2016-12-19 MED ORDER — FLUDEOXYGLUCOSE F - 18 (FDG) INJECTION
9.8100 | Freq: Once | INTRAVENOUS | Status: AC | PRN
  Administered 2016-12-19: 9.81 via INTRAVENOUS

## 2016-12-20 NOTE — Progress Notes (Signed)
[place]  [phone]) [phone] Fax:([phone]) [phone]  Clinic Follow Up Note  Patient Care Team: Curlene Labrum, MD as PCP - General (Family Medicine) 12/21/2016  CHIEF COMPLAINTS:  Follow up metastatic right colon cancer   Oncology History   Cancer Staging Metastatic colon cancer to liver [facility]) Staging form: Colon and Rectum, AJCC 8th Edition - Clinical stage from 06/01/2016: Stage IVA (cTX, cNX, pM1a) - Signed by Truitt Merle, MD on 07/04/2016 - Pathologic stage from 06/14/2016: Stage IVA (pT4b(m), pN2b, pM1a) - Signed by Truitt Merle, MD on 07/04/2016       Metastatic colon cancer to liver ([place])   04/2015 Procedure    Colonoscopy by Dr. Ladona Horns. It showed showed 2 sessile polyps ready between 3-5 mm in size located 20 cm (A, B) from the point of entry, polypectomy was performed. Pedunculated polyp was found in the ascending colon (C), polypectomy was performed, and additional polyp (D) was found 30 cm from the point of entry, removed      04/2015 Pathology Results    tubular adenoma (A and B), and well differentiated adenocarcinoma arising from tubulovillous adenoma (C) and well differentiated adenocarcinoma arising from severe dysplasia to intramucosal carcinoma within tubular adenoma.       04/2015 Initial Diagnosis    Metastatic colon cancer to liver ([place])      05/29/2016 Imaging    CT abdomen and pelvis with contrast showed an apple core like stricture in right colon just above the cecum, measuring 3.2 cm in lengths. This is highly suspicious for malignancy. Small lymph node a noticed he had adjacent mesentery, measuring 8 mm. There is a low-density lesion within the inferior aspect of the right hepatic lobe measuring 1.7 cm, suspicious for metastasis.       06/06/2016 Tumor Marker    CEA 9.99      06/08/2016 PET scan    IMPRESSION: Approximately 3 cm hypermetabolic mass in the ascending colon, consistent with primary colon carcinoma. This mass results in  colonic obstruction and small bowel dilatation. Additional areas of hypermetabolic wall thickening in the cecum may represent other sites of colon carcinoma or colitis. Mild hypermetabolic lymphadenopathy in right pericolonic region, porta hepatis, and aortocaval space, consistent with metastatic disease. Mild hypermetabolic mediastinal lymphadenopathy also seen, and thoracic lymph node metastases cannot be excluded. Solitary hypermetabolic focus in inferior right hepatic lobe, consistent with liver metastasis. Consider abdomen MRI without and with contrast for further evaluation.      06/14/2016 Surgery    Hand assisted right hemicolectomy and small bowel resection for colon cancer, liver biopsy, by Dr. Barry Dienes      06/14/2016 Pathology Results    Right hemicolectomy showed invasive well to moderately differentiated adenocarcinoma, 2 foci measuring 7.5 cm and 4.5 cm, tumor invades through full thickness of colon, to the seroma and involve the Small Bowel, Surgical Margins Were Negative, 24 Out Of 64 Lymph Nodes Were Positive, Extracapsular Extension Identified, Multiple Satellite Tumor Deposits Present, Liver Biopsy Showed Metastatic Adenocarcinoma.        06/14/2016 Miscellaneous    Tumor MMR normal, MSI stable       06/14/2016 Miscellaneous    Foundation one genomic testing showed K-ras G12 D mutation, APC and TP53 mutation. No BRAF and NRAS mutation. MSI-stable, tumor burden low.      07/06/2016 Tumor Marker    CEA 13.69      07/13/2016 -  Chemotherapy    mFOLFOX, every 2 weeks, started on 07/14/2015, Avastin added from  cycle 3  Oxaliplatin dose to 23m/m2 due to side effects and some cytopenia on 09/21/16  Changed to FOLFIRINOX starting cycle 7 and Reduced  Due to neuropathy hold Oxaliplatin and add Irinotecan with neulasta on day 3 starting with cycle 7 Add low dose Oxaliplatin with cycle 8.         07/27/2016 Tumor Marker    CEA 15.85      09/04/2016 Imaging    Ct C/A/P W  Contrast IMPRESSION: Interval right colectomy. Stable small liver metastasis in the inferior right hepatic lobe. Stable mild porta hepatis and aortocaval lymphadenopathy. Stable mild mediastinal lymphadenopathy. No new or progressive metastatic disease identified within the chest, abdomen, or pelvis.      12/19/2016 PET scan    IMPRESSION: 1. Right hemicolectomy, with resolution of the prior hypermetabolic activity inferiorly in the right hepatic lobe, in several mediastinal lymph nodes, and in lymph nodes in the retroperitoneum and porta hepatis. No residual hypermetabolic or enlarged lymph nodes are identified. 2. Low-grade diffuse skeletal metabolic activity is likely therapy related. 3. Coronary atherosclerosis. 4. 3 by 4 mm right middle lobe pulmonary nodule is stable, not appreciably hypermetabolic, but below sensitive PET-CT size thresholds. This may warrant surveillance.       HISTORY OF PRESENTING ILLNESS (06/01/2016):  Nicholas Pardo89y.o. male is here because of his recently abdominal CT which is highly suspicious for metastatic colon cancer. He is accompanied by his wife to my clinic today. He was referred by his primary care physician Dr. BPleas Koch   He had colonoscopy in 2016 for mild rectal bleeding, he describe small amount fresh blood mixed with stool, he has no other constitutional symptoms at that time. He was referred to gastroenterologist Dr. CLadona Hornsand underwent a colonoscopy in December 2016. The colonoscopy showed 2 sessile polyps ready between 3-5 mm in size located 20 cm (A, B) from the point of entry, polypectomy was performed. Pedunculated polyp was found in the ascending colon (C), polypectomy was performed, and additional polyp (D) was found 30 cm from the point of entry, removed. The pathology reviewed tubular adenoma (A and B), and well differentiated adenocarcinoma arising from tubulovillous adenoma (C) and well differentiated adenocarcinoma arising from  severe dysplasia to intramucosal carcinoma within tubular adenoma. Dr. CTye Marylandtried multiple times to reach patient, but patient sought they were calling him about the bill, and did not return the phone calls. He was not aware the cancer diagnosis until recently.   He started having diarrhea and vomiting in mid Dec 2017, and felt a "pop" in right side abdomen, he was seen at urgent care, and was treated with antiemetics, and IVF, lab tests were OK. Due to his persistent intermittent diarrhea and epigastric pain since then, he was seen by PCP and he eventually had CT abdomen and pelvis scan which showed a upper core lesion in the ascending colon, and I'll 1.7 cm lesion in the liver, highly suspicious for metastasis. [place]  He has lost 30 lbs in the past one month, has low appetite, eats a small meals 1-2 times a day. He has moderate fatigue, able to tolerate routine activities including his work, but feels exhausted at the end of study. He has occasional constipation, denies recent rectal bleeding.  CURRENT THERAPY: mFOLFOX, every 2 weeks, started on 07/14/2015, Avastin added from cycle 3. Lower dose of Oxaliplatin 09/21/16. Changed to FOLFIRINOX on 10/05/16,  Add neulasta day 3 starting with cycle 7, oxaliplatin held  due to neuropathy from 12/21/2016  INTERIM HISTORY:  Nicholas Lowery returns today for follow up. He has been experiencing worsening neuropathy that he mentions to be tolerable. He denies associated pains. Patient admits to constant diarrhea. He has been taking Lomotil once daily. He denies any chills. Since his change of treatment, he denies any vomiting.  He tends to sleep more to escape his reality of cancer.  Per his wife, he feels like his entire body is tingling on some days.  Marland Kitchen  MEDICAL HISTORY:  Past Medical History:  Diagnosis Date  . Anxiety   . Cancer of ascending colon ([place])   . Seasonal allergies     SURGICAL HISTORY: Past Surgical History:    Procedure Laterality Date  . APPENDECTOMY  1992  . CATARACT EXTRACTION W/ INTRAOCULAR LENS IMPLANT Left 04/2016  . COLON SURGERY    . COLONOSCOPY W/ BIOPSIES AND POLYPECTOMY  04/2015  . INGUINAL HERNIA REPAIR Right 1982  . KNEE ARTHROSCOPY W/ MENISCECTOMY Right 1998  . LAPAROSCOPIC RIGHT COLECTOMY N/A 06/14/2016   Procedure: LAPAROSCOPIC HAND ASSISTED HEMICOLECTOMY AND SMALL BOWEL RESECTION.;  Surgeon: [name], MD;  Location: [place];  Service: General;  Laterality: N/A;  . LIVER BIOPSY Right 06/14/2016   Procedure: LIVER BIOPSY;  Surgeon: [name], MD;  Location: [place];  Service: General;  Laterality: Right;  Right Inferior Liver  . PORTACATH PLACEMENT N/A 07/06/2016   Procedure: INSERTION PORT-A-CATH;  Surgeon: [name], MD;  Location: [place];  Service: General;  Laterality: N/A;  . VASECTOMY  2005    SOCIAL HISTORY: Social History   Social History  . Marital status: Married    Spouse name: N/A  . Number of children: N/A  . Years of education: N/A   Occupational History  . Not on file.   Social History Main Topics  . Smoking status: Former Smoker    Years: 2.00    Types: Cigarettes    Quit date: 90  . Smokeless tobacco: Never Used  . Alcohol use 2.4 oz/week    4 Cans of beer per week     Comment: social, none since colon surgery  . Drug use: No     Comment: 06/15/2016 "nothing since college"  . Sexual activity: Yes   Other Topics Concern  . Not on file   Social History Narrative  . No narrative on file   He is married.  They have 3 boys, 28-12 yo. He works for a [employer], desk job.   FAMILY HISTORY: Family History  Problem Relation Age of Onset  . Cancer Mother        lung cancer  . Stroke Mother   . Hypertension Father   . CAD Father   . Cancer Maternal Grandfather        prostate cancer     ALLERGIES:  is allergic to penicillins.  MEDICATIONS:  Current Outpatient Prescriptions  Medication Sig Dispense Refill   . amLODipine (NORVASC) 10 MG tablet Take 1 tablet (10 mg total) by mouth daily. 30 tablet 2  . celecoxib (CELEBREX) 200 MG capsule Take 200 mg by mouth daily.  1  . diphenoxylate-atropine (LOMOTIL) 2.5-0.025 MG tablet TAKE TWO TABLETS BY MOUTH FOUR TIMES DAILY AS NEEDED FOR FOR DIARRHEA OR loose stools  0  . FLUoxetine (PROZAC) 40 MG capsule Take 40 mg by mouth daily.    Marland Kitchen lidocaine-prilocaine (EMLA) cream Apply to affected area once 30 g 3  . lisinopril (PRINIVIL) 10 MG tablet Take  1 tablet (10 mg total) by mouth daily. 90 tablet 0  . LORazepam (ATIVAN) 0.5 MG tablet Take 1 tablet (0.5 mg total) by mouth every 8 (eight) hours as needed (for nausea). 30 tablet 0  . ondansetron (ZOFRAN ODT) 8 MG disintegrating tablet Take 1 tablet (8 mg total) by mouth every 8 (eight) hours as needed for nausea or vomiting. 30 tablet 2  . prochlorperazine (COMPAZINE) 10 MG tablet Take 1 tablet (10 mg total) by mouth every 6 (six) hours as needed for nausea or vomiting. 30 tablet 2  . traMADol (ULTRAM) 50 MG tablet Take 1-2 tablets (50-100 mg total) by mouth every 6 (six) hours as needed for moderate pain or severe pain. 30 tablet 0  . dexamethasone (DECADRON) 4 MG tablet For 5 days after chemo (begin day 2) (Patient not taking: Reported on 12/21/2016) 20 tablet 0  . docusate sodium (COLACE) 100 MG capsule Take 100 mg by mouth as needed.      No current facility-administered medications for this visit.    Facility-Administered Medications Ordered in Other Visits  Medication Dose Route Frequency Provider Last Rate Last Dose  . clindamycin (CLEOCIN) 900 mg in dextrose 5 % 50 mL IVPB  900 mg Intravenous 60 min Pre-Op [name], MD       And  . gentamicin (GARAMYCIN) 450 mg in dextrose 5 % 50 mL IVPB  5 mg/kg Intravenous 60 min Pre-Op [name], MD        REVIEW OF SYSTEMS:  Constitutional: Denies fevers, chills or abnormal night sweats   Eyes: Denies blurriness of vision, double vision or watery  eyes Ears, nose, mouth, throat, and face: Denies mucositis or sore throat  Respiratory: Denies cough, dyspnea or wheezes Cardiovascular: Denies palpitation, chest discomfort or lower extremity swelling Gastrointestinal:  Denies heartburn or change in bowel habits (+) episodes of severe nausea  (+)constant diarrhea Skin: (+) bilateral upper extremity rash (+) skin tag on left cheek  Lymphatics: Denies new lymphadenopathy or easy bruising Neurological: (+) worsened neuropathy in fingers, more tingling in feet MSK: Muscle weakness in hands Behavioral/Psych: Mood is stable, no new changes (+) depression/anxiety All other systems were reviewed with the patient and are negative.  PHYSICAL EXAMINATION:  ECOG PERFORMANCE STATUS: 1 - Symptomatic but completely ambulatory  Vitals:   12/21/16 1001  BP: 133/84  Pulse: 75  Resp: 18  Temp: 97.9 F (36.6 C)  TempSrc: Oral  SpO2: 100%  Weight: 201 lb 1.6 oz (91.2 kg)  Height: 5' 11" (1.803 m)   . GENERAL:alert, no distress and comfortable. SKIN: skin color, texture, turgor are normal, no rashes or significant lesions EYES: normal, conjunctiva are pink and non-injected, sclera clear OROPHARYNX:no exudate, no erythema and lips, buccal mucosa, and tongue normal  NECK: supple, thyroid normal size, non-tender, without nodularity LYMPH:  no palpable lymphadenopathy in the cervical, axillary or inguinal LUNGS: clear to auscultation and percussion with normal breathing effort HEART: regular rate & rhythm and no murmurs and no lower extremity edema ABDOMEN:abdomen soft, Surgical scar in the midline around the umbilical has healed well. non-tender and normal bowel sounds Musculoskeletal:no cyanosis of digits and no clubbing  PSYCH: alert & oriented x 3 with fluent speech NEURO: no focal motor/sensory deficits (+) mildly decreased bilateral vibration sensation in feet  LABORATORY DATA:  I have reviewed the data as listed CBC Latest Ref Rng & Units  12/21/2016 12/07/2016 11/13/2016  WBC 4.0 - 10.3 10e3/uL 6.0 3.6(L) 5.6  Hemoglobin 13.0 - 17.1 g/dL  12.7(L) 12.1(L) 11.8(L)  Hematocrit 38.4 - 49.9 % 38.0(L) 37.3(L) 35.1(L)  Platelets 140 - 400 10e3/uL 105(L) 136(L) 136(L)   CMP Latest Ref Rng & Units 12/21/2016 12/07/2016 11/13/2016  Glucose 70 - 140 mg/dl 105 113 97  BUN 7.0 - 26.0 mg/dL 15.6 16.8 11.7  Creatinine 0.7 - 1.3 mg/dL 1.1 1.2 1.1  Sodium 136 - 145 mEq/L 140 139 141  Potassium 3.5 - 5.1 mEq/L 4.5 4.3 4.6  Chloride 101 - 111 mmol/L - - -  CO2 22 - 29 mEq/L _0 Calcium 8.4 - 10.4 mg/dL 9.2 9.3 9.2  Total Protein 6.4 - 8.3 g/dL 6.8 6.9 6.8  Total Bilirubin 0.20 - 1.20 mg/dL 0.35 0.59 0.32  Alkaline Phos 40 - 150 U/L 102 65 74  AST 5 - 34 U/L _1 ALT 0 - 55 U/L _2 ANC 2.3K previously  PATHOLOGY   Diagnosis 06/14/2016 1. Colon, segmental resection for tumor, Right Ascending Hemicolectomy and Small Bowel - INVASIVE WELL TO MODERATELY DIFFERENTIATED ADENOCARCINOMA. - TWO TUMOR FOCI MEASURING 7.5 CM AND 4.5 CM IN GREATEST DIMENSION. - TUMOR INVADES THROUGH FULL THICKNESS OF COLON, THROUGH THE SEROSA TO INVOLVE THE SMALL BOWEL. - MARGINS ARE NEGATIVE. - TWENTY FOUR OF SIXTY FOUR LYMPH NODES POSITIVE FOR METASTATIC ADENOCARCINOMA (24/64). - EXTRACAPSULAR EXTENSION IDENTIFIED - MULTIPLE SATELLITE TUMOR DEPOSITS PRESENT. - SEE ONCOLOGY TEMPLATE. 2. Liver, needle/core biopsy, Right Inferior - POSITIVE FOR METASTATIC ADENOCARCINOMA.    RADIOGRAPHIC STUDIES: I have personally reviewed his outside CT scan from 05/29/2016 and agreed with the findings in the report. Nm Pet Image Restag (ps) Skull Base To Thigh  Result Date: 12/19/2016 CLINICAL DATA:  Subsequent treatment strategy for metastatic colon cancer to the liver. EXAM: NUCLEAR MEDICINE PET SKULL BASE TO THIGH TECHNIQUE: 9.8 mCi F-18 FDG was injected intravenously. Full-ring PET imaging was performed from the skull base to thigh after the radiotracer. CT data  was obtained and used for attenuation correction and anatomic localization. FASTING BLOOD GLUCOSE:  Value: 90 mg/dl COMPARISON:  Multiple exams, including 06/08/2016 and prior CT from 09/04/2016 FINDINGS: NECK No hypermetabolic lymph nodes in the neck. CHEST 6 mm in short axis right upper paratracheal lymph node and adjacent 8 mm in short axis right paratracheal lymph node on images 59 and 60 of series 4 demonstrate no significantly accentuated metabolic activity compared to background mediastinal activity. This represents an improvement compared to the prior exam where the activity of these lymph nodes had a maximum SUV of 6.2, which was above the background mediastinal blood pool activity. A posterior paraesophageal lymph node measures 0.8 cm in short axis on image 76/4 (Formerly 1.2 cm by my measurement) and has a maximum SUV of 3.0 (formerly 6.0). Currently there is no appreciable hypermetabolic adenopathy in the chest. Left Port-A-Cath tip: SVC. Left anterior descending coronary artery atherosclerotic calcification noted mild cardiomegaly. 3 by 4 mm right middle lobe pulmonary nodule on image 50/7 is not appreciably hypermetabolic but is below sensitive PET-CT size thresholds. ABDOMEN/PELVIS At the site of the prior hypermetabolic lesion inferiorly in the right hepatic lobe, no current abnormal hypermetabolic activity is identified. No new liver lesions observed. No focal splenic abnormality is identified. The spleen measures 10.0 by 4.6 by 15.8 cm (volume = 380 cm^3). Physiologic activity in the bowel. Right hemicolectomy. No current hypermetabolic adenopathy is observed along the porta hepatis. Resolution of prior hypermetabolic adenopathy in the aortocaval space. SKELETON Low-grade diffuse metabolic activity is likely  therapy related. No focal hypermetabolic activity to suggest skeletal metastasis. IMPRESSION: 1. Right hemicolectomy, with resolution of the prior hypermetabolic activity inferiorly in the  right hepatic lobe, in several mediastinal lymph nodes, and in lymph nodes in the retroperitoneum and porta hepatis. No residual hypermetabolic or enlarged lymph nodes are identified. 2. Low-grade diffuse skeletal metabolic activity is likely therapy related. 3. Coronary atherosclerosis. 4. 3 by 4 mm right middle lobe pulmonary nodule is stable, not appreciably hypermetabolic, but below sensitive PET-CT size thresholds. This may warrant surveillance. Electronically Signed   By: Van Clines M.D.   On: 12/19/2016 15:49    CT CAP 09/04/16 IMPRESSION: Interval right colectomy. Stable small liver metastasis in the inferior right hepatic lobe. Stable mild porta hepatis and aortocaval lymphadenopathy. Stable mild mediastinal lymphadenopathy. No new or progressive metastatic disease identified within the chest, abdomen, or pelvis.  ASSESSMENT & PLAN:  56 y.o. Caucasian male, without significant past medical history, presented with rectal bleeding in December 2016, colonoscopy showed 4 polyps, 2 of them showed invasive adenocarcinoma, unfortunately he was not aware of the pathology findings and was not treated. He now presented with fatigue, weight loss, anorexia and abdominal pain  1. Right colon cancer with liver and node metastasis, pT4bN2bM1a, stage IV, MSI-stable, KRAS mutation (+) -I previously reviewed her PET scan findings, which showed solitary liver metastasis, abdominal and possible thoracic node metastasis  -His liver biopsy confirmed metastasis -Due to the bowel obstruction, he underwent upfront hemicolectomy. I previously reviewed his surgical pathology findings, which showed 2 primary right colon cancer, very locally advanced with 24 lymph nodes positive, surgical margins were negative. -I previously discussed his Foundation one genomic testing results, which showed care arrest mutation, and as high stable, low tumor burden. So he would not benefit from EGFR inhibitor, or  immunotherapy alone. -The patient has started first-line chemotherapy FOLFOX. Avastin was added from cycle 3 -The goal for chemotherapy is palliative, giving his metastatic disease. -He has solitary liver metastasis, but multiple mild hypermetabolic adenopathy in mediastinum and abdomen. We previously discussed the possible local therapy options, such as liver metastectomy or ablation if he has great response to chemo and his node metastasis resolves  -restaging CT from 09/04/2016 reviewed with pt, stable disease. Will continue chemo -he has developed more side effects from chemo FOLFOX, and a worsening thrombocytopenia, despite reduced dose of oxaliplatin  -Due to his overall limited response to FOLFOX, and his wish to pursue surgery, I previously recommend him to change chemo to FOLFIRINOX with low dose  oxaliplatin, hopefully he will response better.  -Due to his severe nausea and vomiting, anorexia, mild diarrhea, I'll reduce his irinotecan dose from cycle 10 --labs reviewed and his  platelet counts are adequate for treatment today  - PET scan from 12/19/2016 reviewed in person. He had near complete metabolic response from chemotherapy, post his liver metastasis and hypermetabolic lymph nodes are negative on PET scan with decreased size.  -Due to his worsening neuropathy, and good response to chemotherapy, I will stop oxaliplatin from this cycle, and continue FOLFIRI and avastin   - present his case to the GI Tumor Board and discuss possible liver targeted therapy such as ablation, or liver resection. Giving his lymph node metastasis, I would favor liver ablation by interventional radiology. -We discussed changing his chemotherapy to maintenance therapy, such as 5-FU or Xeloda with Avastin after his liver ablation.  2. Hypertension -His blood pressure has been high lately, I have previously given him clonidine 0.1 mg  in the infusion room before Avastin -We previously discussed Avastin can cause  hypertension -I have started him on amlodipine, and increase dose to 10 million daily -He does not want to follow-up with his primary care physician. -His blood picture remains to be high, especially diastolic blood pressure. I added on lisinopril 10 mg daily for him -Change Amlodipine prescription to 5m    3. Anemia of iron deficiency and chemo  -He has mild anemia, likely related to his colon cancer bleeding. -His HGB was low following surgery. I have order a repeat lab. -I previously recommend him to take oral iron supplement over-the-counter, potential side effects of constipation and gastric or discomfort or discussed with him. He stopped taking this before surgery. -I previously advised him to begin taking oral iron supplements again. - Getting better ,11.3 previously. - Iron levels previously on 08/24/16 at 46. - We will continue to monitor iron levels   4. Anorexia and weight loss -Secondary to underlying malignancy -I previously encouraged him to try nutritional supplement, such as boost or initial -The patient has previously gained some weight .  5. Peripheral neuropathy, G2 -Secondary to oxaliplatin, dose reduced -Slightly worse daily, we'll hold oxaliplatin  6. Goal of care discussion  -We previously discussed the incurable nature of his cancer, and the overall poor prognosis, especially if he does not have good response to chemotherapy or progress on chemo -The patient understands the goal of care is palliative.  -He is full code now   7. Episodes of Sever nausea and vomiting secondary to chemo  -Due to nausea and vomiting we add IV emend before chemo -His nausea may also be psychological -I suggest he to take dexa 428mdaily for 5 days after chemo, in addition to Zofran, Compazine and Ativan   Plan  -Lab reviewed, adequate for treatment, we'll continue chemotherapy FOLFIRI and Avastin today, will hold oxaliplatin from now on due to his worsening neuropathy and  excellent response to chemotherapy -F/u on 8/23 - refill Ativan today - discuss in Tumor Board if he is a candidate for liver ablation  No orders of the defined types were placed in this encounter.   All questions were answered.   The patient knows to call the clinic with any problems, questions or concerns. I spent 30 minutes counseling the patient face to face. The total time spent in the appointment was 40 minutes and more than 50% was on counseling.  This document serves as a record of services personally performed by YaTruitt MerleMD. It was created on her behalf by TaBrandt Loosena trained medical scribe. The creation of this record is based on the scribe's personal observations and the provider's statements to them. This document has been checked and approved by the attending provider  FeTruitt Merle8/01/2017

## 2016-12-21 ENCOUNTER — Ambulatory Visit (HOSPITAL_BASED_OUTPATIENT_CLINIC_OR_DEPARTMENT_OTHER): Payer: BLUE CROSS/BLUE SHIELD

## 2016-12-21 ENCOUNTER — Ambulatory Visit (HOSPITAL_BASED_OUTPATIENT_CLINIC_OR_DEPARTMENT_OTHER): Payer: BLUE CROSS/BLUE SHIELD | Admitting: Hematology

## 2016-12-21 ENCOUNTER — Other Ambulatory Visit (HOSPITAL_BASED_OUTPATIENT_CLINIC_OR_DEPARTMENT_OTHER): Payer: BLUE CROSS/BLUE SHIELD

## 2016-12-21 ENCOUNTER — Encounter: Payer: Self-pay | Admitting: Hematology

## 2016-12-21 VITALS — BP 133/84 | HR 75 | Temp 97.9°F | Resp 18 | Ht 71.0 in | Wt 201.1 lb

## 2016-12-21 VITALS — BP 111/63 | HR 62 | Temp 98.2°F | Resp 20

## 2016-12-21 DIAGNOSIS — Z5112 Encounter for antineoplastic immunotherapy: Secondary | ICD-10-CM

## 2016-12-21 DIAGNOSIS — G620 Drug-induced polyneuropathy: Secondary | ICD-10-CM

## 2016-12-21 DIAGNOSIS — C189 Malignant neoplasm of colon, unspecified: Secondary | ICD-10-CM

## 2016-12-21 DIAGNOSIS — C182 Malignant neoplasm of ascending colon: Secondary | ICD-10-CM

## 2016-12-21 DIAGNOSIS — C787 Secondary malignant neoplasm of liver and intrahepatic bile duct: Secondary | ICD-10-CM | POA: Diagnosis not present

## 2016-12-21 DIAGNOSIS — D6481 Anemia due to antineoplastic chemotherapy: Secondary | ICD-10-CM | POA: Diagnosis not present

## 2016-12-21 DIAGNOSIS — Z452 Encounter for adjustment and management of vascular access device: Secondary | ICD-10-CM | POA: Diagnosis not present

## 2016-12-21 DIAGNOSIS — Z95828 Presence of other vascular implants and grafts: Secondary | ICD-10-CM

## 2016-12-21 DIAGNOSIS — I10 Essential (primary) hypertension: Secondary | ICD-10-CM

## 2016-12-21 DIAGNOSIS — Z5111 Encounter for antineoplastic chemotherapy: Secondary | ICD-10-CM

## 2016-12-21 DIAGNOSIS — D500 Iron deficiency anemia secondary to blood loss (chronic): Secondary | ICD-10-CM

## 2016-12-21 LAB — CBC WITH DIFFERENTIAL/PLATELET
BASO%: 0.9 % (ref 0.0–2.0)
Basophils Absolute: 0.1 10*3/uL (ref 0.0–0.1)
EOS%: 0.6 % (ref 0.0–7.0)
Eosinophils Absolute: 0 10*3/uL (ref 0.0–0.5)
HCT: 38 % — ABNORMAL LOW (ref 38.4–49.9)
HGB: 12.7 g/dL — ABNORMAL LOW (ref 13.0–17.1)
LYMPH%: 12.9 % — AB (ref 14.0–49.0)
MCH: 32.6 pg (ref 27.2–33.4)
MCHC: 33.6 g/dL (ref 32.0–36.0)
MCV: 97.2 fL (ref 79.3–98.0)
MONO#: 0.3 10*3/uL (ref 0.1–0.9)
MONO%: 5.7 % (ref 0.0–14.0)
NEUT#: 4.8 10*3/uL (ref 1.5–6.5)
NEUT%: 79.9 % — ABNORMAL HIGH (ref 39.0–75.0)
Platelets: 105 10*3/uL — ABNORMAL LOW (ref 140–400)
RBC: 3.9 10*6/uL — AB (ref 4.20–5.82)
RDW: 16.4 % — ABNORMAL HIGH (ref 11.0–14.6)
WBC: 6 10*3/uL (ref 4.0–10.3)
lymph#: 0.8 10*3/uL — ABNORMAL LOW (ref 0.9–3.3)

## 2016-12-21 LAB — COMPREHENSIVE METABOLIC PANEL
ALT: 19 U/L (ref 0–55)
AST: 16 U/L (ref 5–34)
Albumin: 3.6 g/dL (ref 3.5–5.0)
Alkaline Phosphatase: 102 U/L (ref 40–150)
Anion Gap: 9 mEq/L (ref 3–11)
BILIRUBIN TOTAL: 0.35 mg/dL (ref 0.20–1.20)
BUN: 15.6 mg/dL (ref 7.0–26.0)
CHLORIDE: 108 meq/L (ref 98–109)
CO2: 23 meq/L (ref 22–29)
Calcium: 9.2 mg/dL (ref 8.4–10.4)
Creatinine: 1.1 mg/dL (ref 0.7–1.3)
EGFR: 76 mL/min/{1.73_m2} — AB (ref >90)
GLUCOSE: 105 mg/dL (ref 70–140)
Potassium: 4.5 mEq/L (ref 3.5–5.1)
SODIUM: 140 meq/L (ref 136–145)
TOTAL PROTEIN: 6.8 g/dL (ref 6.4–8.3)

## 2016-12-21 MED ORDER — SODIUM CHLORIDE 0.9 % IV SOLN
Freq: Once | INTRAVENOUS | Status: AC
  Administered 2016-12-21: 12:00:00 via INTRAVENOUS
  Filled 2016-12-21: qty 5

## 2016-12-21 MED ORDER — ATROPINE SULFATE 1 MG/ML IJ SOLN
0.4000 mg | Freq: Once | INTRAMUSCULAR | Status: AC
  Administered 2016-12-21: 0.5 mg via INTRAVENOUS

## 2016-12-21 MED ORDER — PALONOSETRON HCL INJECTION 0.25 MG/5ML
0.2500 mg | Freq: Once | INTRAVENOUS | Status: AC
  Administered 2016-12-21: 0.25 mg via INTRAVENOUS

## 2016-12-21 MED ORDER — PALONOSETRON HCL INJECTION 0.25 MG/5ML
INTRAVENOUS | Status: AC
  Filled 2016-12-21: qty 5

## 2016-12-21 MED ORDER — ATROPINE SULFATE 1 MG/ML IJ SOLN
INTRAMUSCULAR | Status: AC
  Filled 2016-12-21: qty 1

## 2016-12-21 MED ORDER — SODIUM CHLORIDE 0.9% FLUSH
10.0000 mL | INTRAVENOUS | Status: DC | PRN
  Filled 2016-12-21: qty 10

## 2016-12-21 MED ORDER — LORAZEPAM 0.5 MG PO TABS: 0.5000 mg | ORAL_TABLET | Freq: Three times a day (TID) | ORAL | 0 refills | Status: DC | PRN

## 2016-12-21 MED ORDER — DEXTROSE 5 % IV SOLN
Freq: Once | INTRAVENOUS | Status: AC
  Administered 2016-12-21: 12:00:00 via INTRAVENOUS

## 2016-12-21 MED ORDER — LEUCOVORIN CALCIUM INJECTION 350 MG
400.0000 mg/m2 | Freq: Once | INTRAMUSCULAR | Status: AC
  Administered 2016-12-21: 840 mg via INTRAVENOUS
  Filled 2016-12-21: qty 42

## 2016-12-21 MED ORDER — DEXTROSE 5 % IV SOLN
160.0000 mg/m2 | Freq: Once | INTRAVENOUS | Status: AC
  Administered 2016-12-21: 340 mg via INTRAVENOUS
  Filled 2016-12-21: qty 15

## 2016-12-21 MED ORDER — SODIUM CHLORIDE 0.9% FLUSH
10.0000 mL | Freq: Once | INTRAVENOUS | Status: AC
  Administered 2016-12-21: 10 mL
  Filled 2016-12-21: qty 10

## 2016-12-21 MED ORDER — SODIUM CHLORIDE 0.9 % IV SOLN
5.0000 mg/kg | Freq: Once | INTRAVENOUS | Status: AC
  Administered 2016-12-21: 450 mg via INTRAVENOUS
  Filled 2016-12-21: qty 16

## 2016-12-21 MED ORDER — FLUOROURACIL CHEMO INJECTION 5 GM/100ML
2375.0000 mg/m2 | INTRAVENOUS | Status: DC
  Administered 2016-12-21: 5000 mg via INTRAVENOUS
  Filled 2016-12-21: qty 100

## 2016-12-21 NOTE — Patient Instructions (Signed)
[facility]cer Center Discharge Instructions for Patients Receiving Chemotherapy  Today you received the following chemotherapy agents Camptosar, Leucovorin, Avastin, Adrucil  To help prevent nausea and vomiting after your treatment, we encourage you to take your nausea medication    If you develop nausea and vomiting that is not controlled by your nausea medication, call the clinic.   BELOW ARE SYMPTOMS THAT SHOULD BE REPORTED IMMEDIATELY:  *FEVER GREATER THAN 100.5 F  *CHILLS WITH OR WITHOUT FEVER  NAUSEA AND VOMITING THAT IS NOT CONTROLLED WITH YOUR NAUSEA MEDICATION  *UNUSUAL SHORTNESS OF BREATH  *UNUSUAL BRUISING OR BLEEDING  TENDERNESS IN MOUTH AND THROAT WITH OR WITHOUT PRESENCE OF ULCERS  *URINARY PROBLEMS  *BOWEL PROBLEMS  UNUSUAL RASH Items with * indicate a potential emergency and should be followed up as soon as possible.  Feel free to call the clinic you have any questions or concerns. The clinic phone number is [phone].  Please show the CHEMO ALERT CARD at check-in to the Emergency Department and triage nurse.   

## 2016-12-23 ENCOUNTER — Ambulatory Visit (HOSPITAL_BASED_OUTPATIENT_CLINIC_OR_DEPARTMENT_OTHER): Payer: BLUE CROSS/BLUE SHIELD

## 2016-12-23 ENCOUNTER — Ambulatory Visit: Payer: BLUE CROSS/BLUE SHIELD

## 2016-12-23 VITALS — BP 116/74 | HR 74 | Temp 97.9°F | Resp 19

## 2016-12-23 DIAGNOSIS — Z5189 Encounter for other specified aftercare: Secondary | ICD-10-CM | POA: Diagnosis not present

## 2016-12-23 DIAGNOSIS — C189 Malignant neoplasm of colon, unspecified: Secondary | ICD-10-CM

## 2016-12-23 DIAGNOSIS — C787 Secondary malignant neoplasm of liver and intrahepatic bile duct: Principal | ICD-10-CM

## 2016-12-23 DIAGNOSIS — C182 Malignant neoplasm of ascending colon: Secondary | ICD-10-CM

## 2016-12-23 MED ORDER — SODIUM CHLORIDE 0.9% FLUSH
10.0000 mL | INTRAVENOUS | Status: DC | PRN
  Administered 2016-12-23: 10 mL
  Filled 2016-12-23: qty 10

## 2016-12-23 MED ORDER — PEGFILGRASTIM INJECTION 6 MG/0.6ML ~~LOC~~
6.0000 mg | PREFILLED_SYRINGE | Freq: Once | SUBCUTANEOUS | Status: AC
  Administered 2016-12-23: 6 mg via SUBCUTANEOUS

## 2016-12-23 MED ORDER — HEPARIN SOD (PORK) LOCK FLUSH 100 UNIT/ML IV SOLN
500.0000 [IU] | Freq: Once | INTRAVENOUS | Status: AC | PRN
Start: 2016-12-23 — End: 2016-12-23
  Administered 2016-12-23: 500 [IU]
  Filled 2016-12-23: qty 5

## 2016-12-23 NOTE — Progress Notes (Signed)
Pump disconnected Pt. Was picking up a chair before coming in and pulled the tubing saw some leakage around the bandage. Needle dislodged after removing bandage. Pt. Accessed again port flushed and heparin locked per protocol. Kelli Hope

## 2016-12-28 ENCOUNTER — Other Ambulatory Visit: Payer: Self-pay | Admitting: Hematology

## 2016-12-28 ENCOUNTER — Other Ambulatory Visit: Payer: Self-pay | Admitting: Nurse Practitioner

## 2016-12-28 DIAGNOSIS — I10 Essential (primary) hypertension: Secondary | ICD-10-CM

## 2016-12-29 ENCOUNTER — Encounter: Payer: Self-pay | Admitting: Nurse Practitioner

## 2016-12-29 ENCOUNTER — Other Ambulatory Visit: Payer: Self-pay | Admitting: *Deleted

## 2016-12-29 DIAGNOSIS — C787 Secondary malignant neoplasm of liver and intrahepatic bile duct: Principal | ICD-10-CM

## 2016-12-29 DIAGNOSIS — C189 Malignant neoplasm of colon, unspecified: Secondary | ICD-10-CM

## 2016-12-29 MED ORDER — DIPHENOXYLATE-ATROPINE 2.5-0.025 MG PO TABS: 2.0000 | ORAL_TABLET | Freq: Four times a day (QID) | ORAL | 0 refills | Status: DC | PRN

## 2017-01-04 ENCOUNTER — Ambulatory Visit (HOSPITAL_BASED_OUTPATIENT_CLINIC_OR_DEPARTMENT_OTHER): Payer: BLUE CROSS/BLUE SHIELD | Admitting: Hematology

## 2017-01-04 ENCOUNTER — Other Ambulatory Visit (HOSPITAL_BASED_OUTPATIENT_CLINIC_OR_DEPARTMENT_OTHER): Payer: BLUE CROSS/BLUE SHIELD

## 2017-01-04 ENCOUNTER — Encounter: Payer: Self-pay | Admitting: Hematology

## 2017-01-04 ENCOUNTER — Ambulatory Visit (HOSPITAL_BASED_OUTPATIENT_CLINIC_OR_DEPARTMENT_OTHER): Payer: BLUE CROSS/BLUE SHIELD

## 2017-01-04 VITALS — BP 107/70 | HR 52

## 2017-01-04 VITALS — BP 122/83 | HR 57 | Temp 97.4°F | Resp 20 | Ht 71.0 in | Wt 200.5 lb

## 2017-01-04 DIAGNOSIS — C182 Malignant neoplasm of ascending colon: Secondary | ICD-10-CM

## 2017-01-04 DIAGNOSIS — D500 Iron deficiency anemia secondary to blood loss (chronic): Secondary | ICD-10-CM

## 2017-01-04 DIAGNOSIS — Z5112 Encounter for antineoplastic immunotherapy: Secondary | ICD-10-CM

## 2017-01-04 DIAGNOSIS — Z452 Encounter for adjustment and management of vascular access device: Secondary | ICD-10-CM

## 2017-01-04 DIAGNOSIS — D509 Iron deficiency anemia, unspecified: Secondary | ICD-10-CM

## 2017-01-04 DIAGNOSIS — G620 Drug-induced polyneuropathy: Secondary | ICD-10-CM | POA: Diagnosis not present

## 2017-01-04 DIAGNOSIS — C787 Secondary malignant neoplasm of liver and intrahepatic bile duct: Secondary | ICD-10-CM

## 2017-01-04 DIAGNOSIS — Z5111 Encounter for antineoplastic chemotherapy: Secondary | ICD-10-CM | POA: Diagnosis not present

## 2017-01-04 DIAGNOSIS — C189 Malignant neoplasm of colon, unspecified: Secondary | ICD-10-CM

## 2017-01-04 DIAGNOSIS — I10 Essential (primary) hypertension: Secondary | ICD-10-CM

## 2017-01-04 DIAGNOSIS — D6481 Anemia due to antineoplastic chemotherapy: Secondary | ICD-10-CM

## 2017-01-04 DIAGNOSIS — Z95828 Presence of other vascular implants and grafts: Secondary | ICD-10-CM

## 2017-01-04 LAB — IRON AND TIBC
%SAT: 29 % (ref 20–55)
IRON: 92 ug/dL (ref 42–163)
TIBC: 316 ug/dL (ref 202–409)
UIBC: 224 ug/dL (ref 117–376)

## 2017-01-04 LAB — CBC WITH DIFFERENTIAL/PLATELET
BASO%: 0.9 % (ref 0.0–2.0)
BASOS ABS: 0.1 10*3/uL (ref 0.0–0.1)
EOS%: 1.3 % (ref 0.0–7.0)
Eosinophils Absolute: 0.1 10*3/uL (ref 0.0–0.5)
HEMATOCRIT: 35.7 % — AB (ref 38.4–49.9)
HEMOGLOBIN: 12.1 g/dL — AB (ref 13.0–17.1)
LYMPH#: 1 10*3/uL (ref 0.9–3.3)
LYMPH%: 9.9 % — ABNORMAL LOW (ref 14.0–49.0)
MCH: 32.9 pg (ref 27.2–33.4)
MCHC: 33.8 g/dL (ref 32.0–36.0)
MCV: 97.4 fL (ref 79.3–98.0)
MONO#: 0.6 10*3/uL (ref 0.1–0.9)
MONO%: 6.5 % (ref 0.0–14.0)
NEUT#: 8 10*3/uL — ABNORMAL HIGH (ref 1.5–6.5)
NEUT%: 81.4 % — AB (ref 39.0–75.0)
PLATELETS: 103 10*3/uL — AB (ref 140–400)
RBC: 3.67 10*6/uL — ABNORMAL LOW (ref 4.20–5.82)
RDW: 16.5 % — AB (ref 11.0–14.6)
WBC: 9.8 10*3/uL (ref 4.0–10.3)

## 2017-01-04 LAB — COMPREHENSIVE METABOLIC PANEL
ALBUMIN: 3.4 g/dL — AB (ref 3.5–5.0)
ALK PHOS: 110 U/L (ref 40–150)
ALT: 17 U/L (ref 0–55)
ANION GAP: 7 meq/L (ref 3–11)
AST: 18 U/L (ref 5–34)
BILIRUBIN TOTAL: 0.39 mg/dL (ref 0.20–1.20)
BUN: 18.4 mg/dL (ref 7.0–26.0)
CALCIUM: 9.3 mg/dL (ref 8.4–10.4)
CO2: 25 mEq/L (ref 22–29)
CREATININE: 1.2 mg/dL (ref 0.7–1.3)
Chloride: 108 mEq/L (ref 98–109)
EGFR: 70 mL/min/{1.73_m2} — ABNORMAL LOW (ref >90)
Glucose: 90 mg/dl (ref 70–140)
Potassium: 4.7 mEq/L (ref 3.5–5.1)
Sodium: 140 mEq/L (ref 136–145)
Total Protein: 6.6 g/dL (ref 6.4–8.3)

## 2017-01-04 LAB — CEA (IN HOUSE-CHCC): CEA (CHCC-In House): 4.02 ng/mL (ref 0.00–5.00)

## 2017-01-04 LAB — FERRITIN: FERRITIN: 241 ng/mL (ref 22–316)

## 2017-01-04 LAB — UA PROTEIN, DIPSTICK - CHCC: Protein, ur: NEGATIVE mg/dL

## 2017-01-04 MED ORDER — FOSAPREPITANT DIMEGLUMINE INJECTION 150 MG
Freq: Once | INTRAVENOUS | Status: AC
  Administered 2017-01-04: 11:00:00 via INTRAVENOUS
  Filled 2017-01-04: qty 5

## 2017-01-04 MED ORDER — PALONOSETRON HCL INJECTION 0.25 MG/5ML
INTRAVENOUS | Status: AC
  Filled 2017-01-04: qty 5

## 2017-01-04 MED ORDER — SODIUM CHLORIDE 0.9 % IV SOLN
5.0000 mg/kg | Freq: Once | INTRAVENOUS | Status: AC
  Administered 2017-01-04: 450 mg via INTRAVENOUS
  Filled 2017-01-04: qty 16

## 2017-01-04 MED ORDER — SODIUM CHLORIDE 0.9% FLUSH
10.0000 mL | Freq: Once | INTRAVENOUS | Status: AC
  Administered 2017-01-04: 10 mL
  Filled 2017-01-04: qty 10

## 2017-01-04 MED ORDER — SODIUM CHLORIDE 0.9 % IV SOLN
INTRAVENOUS | Status: DC
  Administered 2017-01-04: 11:00:00 via INTRAVENOUS

## 2017-01-04 MED ORDER — SODIUM CHLORIDE 0.9 % IV SOLN
2370.0000 mg/m2 | INTRAVENOUS | Status: DC
  Administered 2017-01-04: 5000 mg via INTRAVENOUS
  Filled 2017-01-04: qty 100

## 2017-01-04 MED ORDER — PALONOSETRON HCL INJECTION 0.25 MG/5ML
0.2500 mg | Freq: Once | INTRAVENOUS | Status: AC
Start: 2017-01-04 — End: 2017-01-04
  Administered 2017-01-04: 0.25 mg via INTRAVENOUS

## 2017-01-04 MED ORDER — DEXTROSE 5 % IV SOLN
400.0000 mg/m2 | Freq: Once | INTRAVENOUS | Status: AC
  Administered 2017-01-04: 840 mg via INTRAVENOUS
  Filled 2017-01-04: qty 42

## 2017-01-04 NOTE — Progress Notes (Signed)
Nicholas Lowery  [phone] [phone]) [phone]  Clinic Follow Up Note  Patient Care Team: Nicholas Labrum, MD as PCP - General (Family Medicine) 01/04/2017  CHIEF COMPLAINTS:  Follow up metastatic right colon cancer   Oncology History   Cancer Staging Metastatic colon cancer to liver [facility]) Staging form: Colon and Rectum, AJCC 8th Edition - Clinical stage from 06/01/2016: Stage IVA (cTX, cNX, pM1a) - Signed by Nicholas Merle, MD on 07/04/2016 - Pathologic stage from 06/14/2016: Stage IVA (pT4b(m), pN2b, pM1a) - Signed by Nicholas Merle, MD on 07/04/2016       Metastatic colon cancer to liver ([place])   04/2015 Procedure    Colonoscopy by Dr. Ladona Lowery. It showed showed 2 sessile polyps ready between 3-5 mm in size located 20 cm (A, B) from the point of entry, polypectomy was performed. Pedunculated polyp was found in the ascending colon (C), polypectomy was performed, and additional polyp (D) was found 30 cm from the point of entry, removed      04/2015 Pathology Results    tubular adenoma (A and B), and well differentiated adenocarcinoma arising from tubulovillous adenoma (C) and well differentiated adenocarcinoma arising from severe dysplasia to intramucosal carcinoma within tubular adenoma.       04/2015 Initial Diagnosis    Metastatic colon cancer to liver ([place])      05/29/2016 Imaging    CT abdomen and pelvis with contrast showed an apple core like stricture in right colon just above the cecum, measuring 3.2 cm in lengths. This is highly suspicious for malignancy. Small lymph node a noticed he had adjacent mesentery, measuring 8 mm. There is a low-density lesion within the inferior aspect of the right hepatic lobe measuring 1.7 cm, suspicious for metastasis.       06/06/2016 Tumor Marker    CEA 9.99      06/08/2016 PET scan    IMPRESSION: Approximately 3 cm hypermetabolic mass in the ascending colon, consistent with primary colon carcinoma. This mass results in  colonic obstruction and small bowel dilatation. Additional areas of hypermetabolic wall thickening in the cecum may represent other sites of colon carcinoma or colitis. Mild hypermetabolic lymphadenopathy in right pericolonic region, porta hepatis, and aortocaval space, consistent with metastatic disease. Mild hypermetabolic mediastinal lymphadenopathy also seen, and thoracic lymph node metastases cannot be excluded. Solitary hypermetabolic focus in inferior right hepatic lobe, consistent with liver metastasis. Consider abdomen MRI without and with contrast for further evaluation.      06/14/2016 Surgery    Hand assisted right hemicolectomy and small bowel resection for colon cancer, liver biopsy, by Dr. Barry Lowery      06/14/2016 Pathology Results    Right hemicolectomy showed invasive well to moderately differentiated adenocarcinoma, 2 foci measuring 7.5 cm and 4.5 cm, tumor invades through full thickness of colon, to the seroma and involve the Small Bowel, Surgical Margins Were Negative, 24 Out Of 64 Lymph Nodes Were Positive, Extracapsular Extension Identified, Multiple Satellite Tumor Deposits Present, Liver Biopsy Showed Metastatic Adenocarcinoma.        06/14/2016 Miscellaneous    Tumor MMR normal, MSI stable       06/14/2016 Miscellaneous    Foundation one genomic testing showed K-ras G12 D mutation, APC and TP53 mutation. No BRAF and NRAS mutation. MSI-stable, tumor burden low.      07/06/2016 Tumor Marker    CEA 13.69      07/13/2016 -  Chemotherapy    mFOLFOX, every 2 weeks, started on 07/14/2015, Avastin added from  cycle 3  Oxaliplatin dose to 23m/m2 due to side effects and some cytopenia on 09/21/16  Changed to FOLFIRINOX starting cycle 7 and Reduced  Due to neuropathy hold Oxaliplatin and add Irinotecan with neulasta on day 3 starting with cycle 7 Add low dose Oxaliplatin with cycle 8.         07/27/2016 Tumor Marker    CEA 15.85      09/04/2016 Imaging    Ct C/A/P W  Contrast IMPRESSION: Interval right colectomy. Stable small liver metastasis in the inferior right hepatic lobe. Stable mild porta hepatis and aortocaval lymphadenopathy. Stable mild mediastinal lymphadenopathy. No new or progressive metastatic disease identified within the chest, abdomen, or pelvis.      12/19/2016 PET scan    IMPRESSION: 1. Right hemicolectomy, with resolution of the prior hypermetabolic activity inferiorly in the right hepatic lobe, in several mediastinal lymph nodes, and in lymph nodes in the retroperitoneum and porta hepatis. No residual hypermetabolic or enlarged lymph nodes are identified. 2. Low-grade diffuse skeletal metabolic activity is likely therapy related. 3. Coronary atherosclerosis. 4. 3 by 4 mm right middle lobe pulmonary nodule is stable, not appreciably hypermetabolic, but below sensitive PET-CT size thresholds. This may warrant surveillance.       HISTORY OF PRESENTING ILLNESS (06/01/2016):  [name]56y.o. male is here because of his recently abdominal CT which is highly suspicious for metastatic colon cancer. He is accompanied by his wife to my clinic today. He was referred by his primary care physician Nicholas Lowery   He had colonoscopy in 2016 for mild rectal bleeding, he describe small amount fresh blood mixed with stool, he has no other constitutional symptoms at that time. He was referred to gastroenterologist Dr. CLadona Hornsand underwent a colonoscopy in December 2016. The colonoscopy showed 2 sessile polyps ready between 3-5 mm in size located 20 cm (A, B) from the point of entry, polypectomy was performed. Pedunculated polyp was found in the ascending colon (C), polypectomy was performed, and additional polyp (D) was found 30 cm from the point of entry, removed. The pathology reviewed tubular adenoma (A and B), and well differentiated adenocarcinoma arising from tubulovillous adenoma (C) and well differentiated adenocarcinoma arising from  severe dysplasia to intramucosal carcinoma within tubular adenoma. Dr. CTye Lowery multiple times to reach patient, but patient sought they were calling him about the bill, and did not return the phone calls. He was not aware the cancer diagnosis until recently.   He started having diarrhea and vomiting in mid Dec 2017, and felt a "pop" in right side abdomen, he was seen at urgent care, and was treated with antiemetics, and IVF, lab tests were OK. Due to his persistent intermittent diarrhea and epigastric pain since then, he was seen by PCP and he eventually had CT abdomen and pelvis scan which showed a upper core lesion in the ascending colon, and I'll 1.7 cm lesion in the liver, highly suspicious for metastasis. [place]  He has lost 30 lbs in the past one month, has low appetite, eats a small meals 1-2 times a day. He has moderate fatigue, able to tolerate routine activities including his work, but feels exhausted at the end of study. He has occasional constipation, denies recent rectal bleeding.  CURRENT THERAPY: mFOLFOX, every 2 weeks, started on 07/14/2015, Avastin added from cycle 3. Lower dose of Oxaliplatin 09/21/16. Changed to FOLFIRINOX on 10/05/16,  Add neulasta day 3 starting with cycle 7, oxaliplatin held  due to neuropathy from 12/21/2016  INTERIM HISTORY:  [name] returns today for follow up and treatment. He has been noticing more neuropathy from legs to groin area when he urinates. He denies any accidents. His numbness in his fingers are tolerable. Since his last cycle, his nausea was more tolerable. He still has some diarrhea. Denies any difference in his fatigue as he is still working and working out. Denied any back pains.  MEDICAL HISTORY:  Past Medical History:  Diagnosis Date  . Anxiety   . Cancer of ascending colon ([place])   . Seasonal allergies     SURGICAL HISTORY: Past Surgical History:  Procedure Laterality Date  . APPENDECTOMY  1992  .  CATARACT EXTRACTION W/ INTRAOCULAR LENS IMPLANT Left 04/2016  . COLON SURGERY    . COLONOSCOPY W/ BIOPSIES AND POLYPECTOMY  04/2015  . INGUINAL HERNIA REPAIR Right 1982  . KNEE ARTHROSCOPY W/ MENISCECTOMY Right 1998  . LAPAROSCOPIC RIGHT COLECTOMY N/A 06/14/2016   Procedure: LAPAROSCOPIC HAND ASSISTED HEMICOLECTOMY AND SMALL BOWEL RESECTION.;  Surgeon: [name], MD;  Location: [place];  Service: General;  Laterality: N/A;  . LIVER BIOPSY Right 06/14/2016   Procedure: LIVER BIOPSY;  Surgeon: [name], MD;  Location: [place];  Service: General;  Laterality: Right;  Right Inferior Liver  . PORTACATH PLACEMENT N/A 07/06/2016   Procedure: INSERTION PORT-A-CATH;  Surgeon: [name], MD;  Location: [place];  Service: General;  Laterality: N/A;  . VASECTOMY  2005    SOCIAL HISTORY: Social History   Social History  . Marital status: Married    Spouse name: N/A  . Number of children: N/A  . Years of education: N/A   Occupational History  . Not on file.   Social History Main Topics  . Smoking status: Former Smoker    Years: 2.00    Types: Cigarettes    Quit date: 46  . Smokeless tobacco: Never Used  . Alcohol use 2.4 oz/week    4 Cans of beer per week     Comment: social, none since colon surgery  . Drug use: No     Comment: 06/15/2016 "nothing since college"  . Sexual activity: Yes   Other Topics Concern  . Not on file   Social History Narrative  . No narrative on file   He is married.  They have 3 boys, 43-12 yo. He works for a [employer], desk job.   FAMILY HISTORY: Family History  Problem Relation Age of Onset  . Cancer Mother        lung cancer  . Stroke Mother   . Hypertension Father   . CAD Father   . Cancer Maternal Grandfather        prostate cancer     ALLERGIES:  is allergic to penicillins.  MEDICATIONS:  Current Outpatient Prescriptions  Medication Sig Dispense Refill  . amLODipine (NORVASC) 10 MG tablet Take 1 tablet  (10 mg total) by mouth daily. 30 tablet 2  . celecoxib (CELEBREX) 200 MG capsule Take 200 mg by mouth daily.  1  . dexamethasone (DECADRON) 4 MG tablet For 5 days after chemo (begin day 2) 20 tablet 0  . FLUoxetine (PROZAC) 40 MG capsule Take 40 mg by mouth daily.    Marland Kitchen lidocaine-prilocaine (EMLA) cream Apply to affected area once 30 g 3  . lisinopril (PRINIVIL) 10 MG tablet Take 1 tablet (10 mg total) by mouth daily. 90 tablet 0  . LORazepam (ATIVAN) 0.5 MG tablet Take  1 tablet (0.5 mg total) by mouth every 8 (eight) hours as needed (for nausea). 30 tablet 0  . ondansetron (ZOFRAN ODT) 8 MG disintegrating tablet Take 1 tablet (8 mg total) by mouth every 8 (eight) hours as needed for nausea or vomiting. 30 tablet 2  . prochlorperazine (COMPAZINE) 10 MG tablet Take 1 tablet (10 mg total) by mouth every 6 (six) hours as needed for nausea or vomiting. 30 tablet 2  . diphenoxylate-atropine (LOMOTIL) 2.5-0.025 MG tablet Take 2 tablets by mouth 4 (four) times daily as needed for diarrhea or loose stools. (Patient not taking: Reported on 01/04/2017) 30 tablet 0  . docusate sodium (COLACE) 100 MG capsule Take 100 mg by mouth as needed.     . traMADol (ULTRAM) 50 MG tablet Take 1-2 tablets (50-100 mg total) by mouth every 6 (six) hours as needed for moderate pain or severe pain. (Patient not taking: Reported on 01/04/2017) 30 tablet 0   No current facility-administered medications for this visit.    Facility-Administered Medications Ordered in Other Visits  Medication Dose Route Frequency Provider Last Rate Last Dose  . clindamycin (CLEOCIN) 900 mg in dextrose 5 % 50 mL IVPB  900 mg Intravenous 60 min Pre-Op [name], MD       And  . gentamicin (GARAMYCIN) 450 mg in dextrose 5 % 50 mL IVPB  5 mg/kg Intravenous 60 min Pre-Op [name], MD        REVIEW OF SYSTEMS:  Constitutional: Denies fevers, chills or abnormal night sweats  (+)slightly improved fatigue Eyes: Denies blurriness of vision,  double vision or watery eyes Ears, nose, mouth, throat, and face: Denies mucositis or sore throat  Respiratory: Denies cough, dyspnea or wheezes Cardiovascular: Denies palpitation, chest discomfort or lower extremity swelling Gastrointestinal:  Denies heartburn or change in bowel habits (+)improved nausea and diarrhea Skin:normal Lymphatics: Denies new lymphadenopathy or easy bruising Neurological: (+) worsened neuropathy in legs up to groin area MSK: Muscle weakness in hands Behavioral/Psych: Mood is stable, no new changes  All other systems were reviewed with the patient and are negative.  PHYSICAL EXAMINATION:  ECOG PERFORMANCE STATUS: 1 - Symptomatic but completely ambulatory  Vitals:   01/04/17 0921  BP: 122/83  Pulse: (!) 57  Resp: 20  Temp: (!) 97.4 F (36.3 C)  TempSrc: Oral  SpO2: 100%  Weight: 200 lb 8 oz (90.9 kg)  Height: '5\' 11"'$  (1.803 m)   . GENERAL:alert, no distress and comfortable. SKIN: skin color, texture, turgor are normal, no rashes or significant lesions EYES: normal, conjunctiva are pink and non-injected, sclera clear OROPHARYNX:no exudate, no erythema and lips, buccal mucosa, and tongue normal  NECK: supple, thyroid normal size, non-tender, without nodularity LYMPH:  no palpable lymphadenopathy in the cervical, axillary or inguinal LUNGS: clear to auscultation and percussion with normal breathing effort HEART: regular rate & rhythm and no murmurs and no lower extremity edema ABDOMEN:abdomen soft, Surgical scar in the midline around the umbilical has healed well. non-tender and normal bowel sounds Musculoskeletal:no cyanosis of digits and no clubbing  PSYCH: alert & oriented x 3 with fluent speech NEURO: no focal motor/sensory deficits (+) mildly decreased bilateral vibration sensation in feet  LABORATORY DATA:  I have reviewed the data as listed CBC Latest Ref Rng & Units 01/04/2017 12/21/2016 12/07/2016  WBC 4.0 - 10.3 10e3/uL 9.8 6.0 3.6(L)    Hemoglobin 13.0 - 17.1 g/dL 12.1(L) 12.7(L) 12.1(L)  Hematocrit 38.4 - 49.9 % 35.7(L) 38.0(L) 37.3(L)  Platelets 140 - 400  10e3/uL 103(L) 105(L) 136(L)   CMP Latest Ref Rng & Units 01/04/2017 12/21/2016 12/07/2016  Glucose 70 - 140 mg/dl 90 105 113  BUN 7.0 - 26.0 mg/dL 18.4 15.6 16.8  Creatinine 0.7 - 1.3 mg/dL 1.2 1.1 1.2  Sodium 136 - 145 mEq/L 140 140 139  Potassium 3.5 - 5.1 mEq/L 4.7 4.5 4.3  Chloride 101 - 111 mmol/L - - -  CO2 22 - 29 mEq/L '25 23 24  '$ Calcium 8.4 - 10.4 mg/dL 9.3 9.2 9.3  Total Protein 6.4 - 8.3 g/dL 6.6 6.8 6.9  Total Bilirubin 0.20 - 1.20 mg/dL 0.39 0.35 0.59  Alkaline Phos 40 - 150 U/L 110 102 65  AST 5 - 34 U/L '18 16 20  '$ ALT 0 - 55 U/L '17 19 24   '$ PATHOLOGY   Diagnosis 06/14/2016 1. Colon, segmental resection for tumor, Right Ascending Hemicolectomy and Small Bowel - INVASIVE WELL TO MODERATELY DIFFERENTIATED ADENOCARCINOMA. - TWO TUMOR FOCI MEASURING 7.5 CM AND 4.5 CM IN GREATEST DIMENSION. - TUMOR INVADES THROUGH FULL THICKNESS OF COLON, THROUGH THE SEROSA TO INVOLVE THE SMALL BOWEL. - MARGINS ARE NEGATIVE. - TWENTY FOUR OF SIXTY FOUR LYMPH NODES POSITIVE FOR METASTATIC ADENOCARCINOMA (24/64). - EXTRACAPSULAR EXTENSION IDENTIFIED - MULTIPLE SATELLITE TUMOR DEPOSITS PRESENT. - SEE ONCOLOGY TEMPLATE. 2. Liver, needle/core biopsy, Right Inferior - POSITIVE FOR METASTATIC ADENOCARCINOMA.    RADIOGRAPHIC STUDIES: I have personally reviewed his outside CT scan from 05/29/2016 and agreed with the findings in the report. Nm Pet Image Restag (ps) Skull Base To Thigh  Result Date: 12/19/2016 CLINICAL DATA:  Subsequent treatment strategy for metastatic colon cancer to the liver. EXAM: NUCLEAR MEDICINE PET SKULL BASE TO THIGH TECHNIQUE: 9.8 mCi F-18 FDG was injected intravenously. Full-ring PET imaging was performed from the skull base to thigh after the radiotracer. CT data was obtained and used for attenuation correction and anatomic localization. FASTING BLOOD  GLUCOSE:  Value: 90 mg/dl COMPARISON:  Multiple exams, including 06/08/2016 and prior CT from 09/04/2016 FINDINGS: NECK No hypermetabolic lymph nodes in the neck. CHEST 6 mm in short axis right upper paratracheal lymph node and adjacent 8 mm in short axis right paratracheal lymph node on images 59 and 60 of series 4 demonstrate no significantly accentuated metabolic activity compared to background mediastinal activity. This represents an improvement compared to the prior exam where the activity of these lymph nodes had a maximum SUV of 6.2, which was above the background mediastinal blood pool activity. A posterior paraesophageal lymph node measures 0.8 cm in short axis on image 76/4 (Formerly 1.2 cm by my measurement) and has a maximum SUV of 3.0 (formerly 6.0). Currently there is no appreciable hypermetabolic adenopathy in the chest. Left Port-A-Cath tip: SVC. Left anterior descending coronary artery atherosclerotic calcification noted mild cardiomegaly. 3 by 4 mm right middle lobe pulmonary nodule on image 50/7 is not appreciably hypermetabolic but is below sensitive PET-CT size thresholds. ABDOMEN/PELVIS At the site of the prior hypermetabolic lesion inferiorly in the right hepatic lobe, no current abnormal hypermetabolic activity is identified. No new liver lesions observed. No focal splenic abnormality is identified. The spleen measures 10.0 by 4.6 by 15.8 cm (volume = 380 cm^3). Physiologic activity in the bowel. Right hemicolectomy. No current hypermetabolic adenopathy is observed along the porta hepatis. Resolution of prior hypermetabolic adenopathy in the aortocaval space. SKELETON Low-grade diffuse metabolic activity is likely therapy related. No focal hypermetabolic activity to suggest skeletal metastasis. IMPRESSION: 1. Right hemicolectomy, with resolution of the prior hypermetabolic activity inferiorly  in the right hepatic lobe, in several mediastinal lymph nodes, and in lymph nodes in the  retroperitoneum and porta hepatis. No residual hypermetabolic or enlarged lymph nodes are identified. 2. Low-grade diffuse skeletal metabolic activity is likely therapy related. 3. Coronary atherosclerosis. 4. 3 by 4 mm right middle lobe pulmonary nodule is stable, not appreciably hypermetabolic, but below sensitive PET-CT size thresholds. This may warrant surveillance. Electronically Signed   By: Van Clines M.D.   On: 12/19/2016 15:49    CT CAP 09/04/16 IMPRESSION: Interval right colectomy. Stable small liver metastasis in the inferior right hepatic lobe. Stable mild porta hepatis and aortocaval lymphadenopathy. Stable mild mediastinal lymphadenopathy. No new or progressive metastatic disease identified within the chest, abdomen, or pelvis.  ASSESSMENT & PLAN:  56 y.o. Caucasian male, without significant past medical history, presented with rectal bleeding in December 2016, colonoscopy showed 4 polyps, 2 of them showed invasive adenocarcinoma, unfortunately he was not aware of the pathology findings and was not treated. He now presented with fatigue, weight loss, anorexia and abdominal pain  1. Right colon cancer with liver and node metastasis, pT4bN2bM1a, stage IV, MSI-stable, KRAS mutation (+) -I previously reviewed her PET scan findings, which showed solitary liver metastasis, abdominal and possible thoracic node metastasis  -His liver biopsy confirmed metastasis -Due to the bowel obstruction, he underwent upfront hemicolectomy. I previously reviewed his surgical pathology findings, which showed 2 primary right colon cancer, very locally advanced with 24 lymph nodes positive, surgical margins were negative. -I previously discussed his Foundation one genomic testing results, which showed care arrest mutation, and as high stable, low tumor burden. So he would not benefit from EGFR inhibitor, or immunotherapy alone. -The patient has started first-line chemotherapy FOLFOX. Avastin was  added from cycle 3 -The goal for chemotherapy is palliative, giving his metastatic disease. -He has solitary liver metastasis, but multiple mild hypermetabolic adenopathy in mediastinum and abdomen. We previously discussed the possible local therapy options, such as liver metastectomy or ablation if he has great response to chemo and his node metastasis resolves  -restaging CT from 09/04/2016 reviewed with pt, stable disease. Will continue chemo -he has developed more side effects from chemo FOLFOX, and a worsening thrombocytopenia, despite reduced dose of oxaliplatin  -Due to his overall limited response to FOLFOX, and his wish to pursue surgery, I previously recommend him to change chemo to FOLFIRINOX with low dose  oxaliplatin, hopefully he will response better.  -Due to his severe nausea and vomiting, anorexia, mild diarrhea, I'll reduce his irinotecan dose from cycle 10  - PET scan from 12/19/2016 reviewed in person. He had near complete metabolic response from chemotherapy, post his liver metastasis and hypermetabolic lymph nodes are negative on PET scan with decreased size.  -Due to his worsening neuropathy, and good response to chemotherapy, I previously stopped oxaliplatin from cycle 11, and continue FOLFIRI and avastin   - I previously presented his case to the GI Tumor Board and discuss possible liver targeted therapy such as ablation, or liver resection. Giving his lymph node metastasis, I favored liver ablation by interventional radiology. We recommend an liver MRI first. I will refer him to IR. Patient also wants to see Dr. Barry Lowery back to review the surgical option also. I sent a message to Dr. Barry Lowery. -We also previously discussed changing his chemotherapy to maintenance therapy, such as 5-FU or Xeloda with Avastin after his liver ablation. - Due to worsening neuropathy, I will hold irinotecan today and continue with 5-FU and Avastin.  I'll likely hold his Avastin also in the near future due to  the pending liver ablation or surgery. - will continue monitoring closely with scans every 3 months  2. Hypertension -His blood pressure has been high lately, I have previously given him clonidine 0.1 mg in the infusion room before Avastin -We previously discussed Avastin can cause hypertension -I have started him on amlodipine, and increase dose to 10 million daily -He does not want to follow-up with his primary care physician. -His blood picture remains to be high, especially diastolic blood pressure. I added on lisinopril 10 mg daily for him -continue Amlodipine '10mg'$  dialy, BP better controlled    3. Anemia of iron deficiency and chemo  -He has mild anemia, likely related to his colon cancer bleeding. -His HGB was low following surgery. I have order a repeat lab. -I previously recommend him to take oral iron supplement over-the-counter, potential side effects of constipation and gastric or discomfort or discussed with him. He stopped taking this before surgery. -I previously advised him to begin taking oral iron supplements again. - Getting better ,11.3 previously. - Iron levels previously on 08/24/16 at 46. - We will continue to monitor iron levels   4. Anorexia and weight loss -Secondary to underlying malignancy -I previously encouraged him to try nutritional supplement, such as boost or initial -The patient has previously gained some weight .  5. Peripheral neuropathy, G2 -Secondary to oxaliplatin, dose reduced -Slightly worse, we'll hold oxaliplatin  6. Goal of care discussion  -We previously discussed the incurable nature of his cancer, and the overall poor prognosis, especially if he does not have good response to chemotherapy or progress on chemo -The patient understands the goal of care is palliative.  -He is full code now    Plan  -Lab reviewed, adequate for treatment, 5FU and Avastin today, will hold Irinotecan today due to his worsening neuropathy, continue  chemotherapy every 2 weeks, I'll likely hold his Avastin soon due to the pending surgery or ablation. - liver MRI in 1-2 weeks  - refer to IR and NicholasByerly to consider liver surgery vs ablation  - call his wife about today's visit - scans every 3 months -F/u in 2 and 4 weeks   Orders Placed This Encounter  Procedures  . MR Abdomen W Wo Contrast    Standing Status:   Future    Standing Expiration Date:   03/06/2018    Scheduling Instructions:     Evaluate liver metastasis, plan to have liver ablation by IR    Order Specific Question:   If indicated for the ordered procedure, I authorize the administration of contrast media per Radiology protocol    Answer:   Yes    Order Specific Question:   What is the patient's sedation requirement?    Answer:   No Sedation    Order Specific Question:   Does the patient have a pacemaker or implanted devices?    Answer:   No    Order Specific Question:   Radiology Contrast Protocol - do NOT remove file path    Answer:   [email or web address]    Order Specific Question:   Preferred imaging location?    Answer:   [facility] (table limit-350 lbs)  . IR Radiologist Eval & Mgmt    Standing Status:   Future    Standing Expiration Date:   03/06/2018    Order Specific Question:   Reason for Exam (SYMPTOM  OR DIAGNOSIS REQUIRED)  Answer:   liver metastasis ablation    Order Specific Question:   Preferred Imaging Location?    Answer:   [facility] [place]    All questions were answered.   The patient knows to call the clinic with any problems, questions or concerns.  I spent 25 minutes counseling the patient face to face. The total time spent in the appointment was 30 minutes and more than 50% was on counseling.  This document serves as a record of services personally performed by Nicholas Merle, MD. It was created on her behalf by Brandt Loosen, a trained medical scribe. The creation of this record is based on the scribe's  personal observations and the provider's statements to them. This document has been checked and approved by the attending provider  Nicholas Lowery  01/04/2017

## 2017-01-04 NOTE — Patient Instructions (Signed)
[facility] Discharge Instructions for Patients Receiving Chemotherapy  Today you received the following chemotherapy agents: Leucovorin, Avastin and Adrucil.  To help prevent nausea and vomiting after your treatment, we encourage you to take your nausea medication    If you develop nausea and vomiting that is not controlled by your nausea medication, call the clinic.   BELOW ARE SYMPTOMS THAT SHOULD BE REPORTED IMMEDIATELY:  *FEVER GREATER THAN 100.5 F  *CHILLS WITH OR WITHOUT FEVER  NAUSEA AND VOMITING THAT IS NOT CONTROLLED WITH YOUR NAUSEA MEDICATION  *UNUSUAL SHORTNESS OF BREATH  *UNUSUAL BRUISING OR BLEEDING  TENDERNESS IN MOUTH AND THROAT WITH OR WITHOUT PRESENCE OF ULCERS  *URINARY PROBLEMS  *BOWEL PROBLEMS  UNUSUAL RASH Items with * indicate a potential emergency and should be followed up as soon as possible.  Feel free to call the clinic you have any questions or concerns. The clinic phone number is [phone].  Please show the [place] at check-in to the Emergency Department and triage nurse.

## 2017-01-04 NOTE — Addendum Note (Signed)
Addendum  created 01/04/17 1027 by Roberts Gaudy, MD   Sign clinical note

## 2017-01-05 ENCOUNTER — Other Ambulatory Visit: Payer: Self-pay

## 2017-01-06 ENCOUNTER — Other Ambulatory Visit: Payer: Self-pay | Admitting: *Deleted

## 2017-01-06 ENCOUNTER — Ambulatory Visit (HOSPITAL_BASED_OUTPATIENT_CLINIC_OR_DEPARTMENT_OTHER): Payer: BLUE CROSS/BLUE SHIELD

## 2017-01-06 VITALS — BP 128/87 | HR 74 | Temp 98.7°F | Resp 18

## 2017-01-06 DIAGNOSIS — C182 Malignant neoplasm of ascending colon: Secondary | ICD-10-CM

## 2017-01-06 DIAGNOSIS — Z5189 Encounter for other specified aftercare: Secondary | ICD-10-CM

## 2017-01-06 DIAGNOSIS — C189 Malignant neoplasm of colon, unspecified: Secondary | ICD-10-CM

## 2017-01-06 DIAGNOSIS — C787 Secondary malignant neoplasm of liver and intrahepatic bile duct: Principal | ICD-10-CM

## 2017-01-06 MED ORDER — PEGFILGRASTIM INJECTION 6 MG/0.6ML ~~LOC~~
6.0000 mg | PREFILLED_SYRINGE | Freq: Once | SUBCUTANEOUS | Status: AC
  Administered 2017-01-06: 6 mg via SUBCUTANEOUS

## 2017-01-06 MED ORDER — SODIUM CHLORIDE 0.9% FLUSH
10.0000 mL | INTRAVENOUS | Status: DC | PRN
  Administered 2017-01-06: 10 mL
  Filled 2017-01-06: qty 10

## 2017-01-06 MED ORDER — HEPARIN SOD (PORK) LOCK FLUSH 100 UNIT/ML IV SOLN
500.0000 [IU] | Freq: Once | INTRAVENOUS | Status: AC | PRN
  Administered 2017-01-06: 500 [IU]
  Filled 2017-01-06: qty 5

## 2017-01-10 ENCOUNTER — Encounter: Payer: Self-pay | Admitting: *Deleted

## 2017-01-10 NOTE — Progress Notes (Signed)
[facility] [place] Work  Clinical Social Work was referred by patient's wife for assistance with financial councerns.  Clinical Social Worker contacted patient's wife via phone to offer support and assess for needs. CSW referred her to financial counselor as well. Pt and husband make too much for assistance programs, but struggling with out of pocket medical expenses as their insurance year is April to May, so two big out of pockets recently. Wife plans to reach back out to billing. CSW team available as needed.    Clinical Social Work interventions:  Supportive listening  Loren Racer, LCSW, OSW-C Clinical Social Worker [place]  [place] Phone: [phone] Fax: [phone]

## 2017-01-11 ENCOUNTER — Ambulatory Visit (HOSPITAL_COMMUNITY)
Admission: RE | Admit: 2017-01-11 | Discharge: 2017-01-11 | Disposition: A | Discharge status: Home / Self Care | Payer: BLUE CROSS/BLUE SHIELD | Source: Ambulatory Visit | Attending: Hematology | Admitting: Hematology

## 2017-01-11 DIAGNOSIS — C189 Malignant neoplasm of colon, unspecified: Secondary | ICD-10-CM | POA: Insufficient documentation

## 2017-01-11 DIAGNOSIS — C787 Secondary malignant neoplasm of liver and intrahepatic bile duct: Secondary | ICD-10-CM | POA: Diagnosis not present

## 2017-01-11 MED ORDER — GADOBENATE DIMEGLUMINE 529 MG/ML IV SOLN
20.0000 mL | Freq: Once | INTRAVENOUS | Status: AC | PRN
  Administered 2017-01-11: 19 mL via INTRAVENOUS

## 2017-01-16 ENCOUNTER — Other Ambulatory Visit: Payer: Self-pay | Admitting: Hematology

## 2017-01-16 ENCOUNTER — Ambulatory Visit
Admission: RE | Admit: 2017-01-16 | Discharge: 2017-01-16 | Disposition: A | Discharge status: Home / Self Care | Payer: BLUE CROSS/BLUE SHIELD | Source: Ambulatory Visit | Attending: Hematology | Admitting: Hematology

## 2017-01-16 DIAGNOSIS — C787 Secondary malignant neoplasm of liver and intrahepatic bile duct: Principal | ICD-10-CM

## 2017-01-16 DIAGNOSIS — C189 Malignant neoplasm of colon, unspecified: Secondary | ICD-10-CM

## 2017-01-16 HISTORY — PX: IR RADIOLOGIST EVAL & MGMT: IMG5224

## 2017-01-16 NOTE — Consult Note (Signed)
Chief Complaint: I have colon cancer.   Referring Physician(s): Feng,Yan PCP: Dr. Pleas Koch Surgeon: Dr. Barry Dienes   History of Present Illness: Nicholas Lowery is a 56 y.o. male presenting as a scheduled appointment today to [facility], kindly referred by [name], for evaluation for potential liver directed therapy as a treatment to known metastatic colorectal carcinoma.   Nicholas Lowery was initially seen by [name] in clinic 06/01/2016.  At that time he, unfortunately, had an imaging diagnosis of metastatic colorectal carcinoma, with CT imaging 05/29/2016 showing single right liver lesion (segment 6) and right colon lesion.  PET CT performed 06/08/2016 confirmed right colon primary and right liver lesion.  In addition, the first PET CT showed evidence of local lymphatic metastases, and questionable mediastinal nodal disease.    He was then seen in short order by Dr. Barry Dienes, 06/12/2016.  His surgery therapy was performed 06/14/2016, with right hemicolectomy and small bowel resection, with liver biopsy.    Surgical pathology 06/14/2016: Diagnosis 1. Colon, segmental resection for tumor, Right Ascending Hemicolectomy and Small Bowel - INVASIVE WELL TO MODERATELY DIFFERENTIATED ADENOCARCINOMA. - TWO TUMOR FOCI MEASURING 7.5 CM AND 4.5 CM IN GREATEST DIMENSION. - TUMOR INVADES THROUGH FULL THICKNESS OF COLON, THROUGH THE SEROSA TO INVOLVE THE SMALL BOWEL. - MARGINS ARE NEGATIVE. - TWENTY FOUR OF SIXTY FOUR LYMPH NODES POSITIVE FOR METASTATIC ADENOCARCINOMA (24/64). - EXTRACAPSULAR EXTENSION IDENTIFIED - MULTIPLE SATELLITE TUMOR DEPOSITS PRESENT. - SEE ONCOLOGY TEMPLATE. 2. Liver, needle/core biopsy, Right Inferior - POSITIVE FOR METASTATIC ADENOCARCINOMA.  His initial chemotherapy was 07/05/2016, with initiation of FOLFOX, plus Avastin.  His strategy changed starting cycle 7 (10/05/2016) to FOLFIRINOX, due to hold Oxaliplatin (neuropathy), and add  Irinotecan with neulasta.  Added low dose Oxaliplatin during cycle 8.   Held Oxaliplatin again 12/21/2016.   He tells me he is about to start his cycle 13.    PET CT imaging was first performed 06/08/2016, then 12/19/2016.  Most recent PET shows changes of hemicolectomy, with no FDG activity in the prior right liver lesion.  No activity of abdominal or mediastinal nodes.  3-37mm RML nodule.    MRI performed 01/11/2017 shows resolution of the right liver lesion.    Nicholas Lowery tells me that he feels fine, and is very active, continuing to work.  He denies any anorexia, with a good appetite, denies N/V, and denies excessive fatigue.  I would say he is ECOG = 0.    The majority of our time was spent discussing his imaging results, including the PET CT imaging and the MRI.  We did spend some time discussing ablation as a minimally invasive therapy, but as he is currently not a candidate, we did not complete this discussion with informed consent.     Past Medical History:  Diagnosis Date  . Anxiety   . Cancer of ascending colon ([place])   . Seasonal allergies     Past Surgical History:  Procedure Laterality Date  . APPENDECTOMY  1992  . CATARACT EXTRACTION W/ INTRAOCULAR LENS IMPLANT Left 04/2016  . COLON SURGERY    . COLONOSCOPY W/ BIOPSIES AND POLYPECTOMY  04/2015  . INGUINAL HERNIA REPAIR Right 1982  . KNEE ARTHROSCOPY W/ MENISCECTOMY Right 1998  . LAPAROSCOPIC RIGHT COLECTOMY N/A 06/14/2016   Procedure: LAPAROSCOPIC HAND ASSISTED HEMICOLECTOMY AND SMALL BOWEL RESECTION.;  Surgeon: [name], MD;  Location: [facility];  Service: General;  Laterality: N/A;  . LIVER BIOPSY Right 06/14/2016   Procedure: LIVER BIOPSY;  Surgeon: [name], MD;  Location: [facility];  Service: General;  Laterality: Right;  Right Inferior Liver  . PORTACATH PLACEMENT N/A 07/06/2016   Procedure: INSERTION PORT-A-CATH;  Surgeon: [name], MD;  Location: [facility];  Service: General;  Laterality: N/A;  .  VASECTOMY  2005    Allergies: Penicillins  Medications: Prior to Admission medications   Medication Sig Start Date End Date Taking? Authorizing Provider  amLODipine (NORVASC) 10 MG tablet Take 1 tablet (10 mg total) by mouth daily. 10/05/16  Yes Truitt Merle, MD  celecoxib (CELEBREX) 200 MG capsule Take 200 mg by mouth daily. 05/21/16  Yes [provider]  dexamethasone (DECADRON) 4 MG tablet For 5 days after chemo (begin day 2) 12/07/16  Yes Truitt Merle, MD  diphenoxylate-atropine (LOMOTIL) 2.5-0.025 MG tablet Take 2 tablets by mouth 4 (four) times daily as needed for diarrhea or loose stools. 12/29/16  Yes Truitt Merle, MD  docusate sodium (COLACE) 100 MG capsule Take 100 mg by mouth as needed.    Yes [provider]  FLUoxetine (PROZAC) 40 MG capsule Take 40 mg by mouth daily.   Yes [provider]  lidocaine-prilocaine (EMLA) cream Apply to affected area once 07/05/16  Yes Truitt Merle, MD  lisinopril (PRINIVIL) 10 MG tablet Take 1 tablet (10 mg total) by mouth daily. 11/29/16  Yes Truitt Merle, MD  LORazepam (ATIVAN) 0.5 MG tablet Take 1 tablet (0.5 mg total) by mouth every 8 (eight) hours as needed (for nausea). 12/21/16  Yes Truitt Merle, MD  ondansetron (ZOFRAN ODT) 8 MG disintegrating tablet Take 1 tablet (8 mg total) by mouth every 8 (eight) hours as needed for nausea or vomiting. 06/06/16  Yes Susanne Borders, NP  prochlorperazine (COMPAZINE) 10 MG tablet Take 1 tablet (10 mg total) by mouth every 6 (six) hours as needed for nausea or vomiting. 07/27/16  Yes Truitt Merle, MD  traMADol (ULTRAM) 50 MG tablet Take 1-2 tablets (50-100 mg total) by mouth every 6 (six) hours as needed for moderate pain or severe pain. 09/06/16  Yes Truitt Merle, MD     Family History  Problem Relation Age of Onset  . Cancer Mother        lung cancer  . Stroke Mother   . Hypertension Father   . CAD Father   . Cancer Maternal Grandfather        prostate cancer     Social History   Social History  .  Marital status: Married    Spouse name: N/A  . Number of children: N/A  . Years of education: N/A   Social History Main Topics  . Smoking status: Former Smoker    Years: 2.00    Types: Cigarettes    Quit date: 47  . Smokeless tobacco: Never Used  . Alcohol use 2.4 oz/week    4 Cans of beer per week     Comment: social, none since colon surgery  . Drug use: No     Comment: 06/15/2016 "nothing since college"  . Sexual activity: Yes   Other Topics Concern  . Not on file   Social History Narrative  . No narrative on file    ECOG Status: 0 - Asymptomatic  Review of Systems: A 12 point ROS discussed and pertinent positives are indicated in the HPI above.  All other systems are negative.  Review of Systems  Vital Signs: BP 120/90   Pulse 82   Temp 98.4 F (36.9 C) (Oral)  Resp 14   Ht 5\' 11"  (1.803 m)   Wt 195 lb (88.5 kg)   SpO2 98%   BMI 27.20 kg/m   Physical Exam General: 56 yo male appearing stated age.  Well-developed, well-nourished.  No distress. HEENT: Atraumatic, normocephalic.  Conjugate gaze, extra-ocular motor intact. No scleral icterus or scleral injection. No lesions on external ears, nose, lips, or gums.  Oral mucosa moist, pink.  Neck: Symmetric with no goiter enlargement.  Chest/Lungs:  Symmetric chest with inspiration/expiration.  No labored breathing.   Marland Kitchen  Heart:    No JVD appreciated.  Abdomen:  Soft, NT/ND   Genito-urinary: Deferred Neurologic: Alert & Oriented to person, place, and time.   Normal affect and insight.  Appropriate questions.  Moving all 4 extremities with gross sensory intact.     Imaging: Nicholas Abdomen W Wo Contrast  Result Date: 01/11/2017 CLINICAL DATA:  Adenocarcinoma of the colon  with liver metastases. EXAM: MRI ABDOMEN WITHOUT AND WITH CONTRAST TECHNIQUE: Multiplanar multisequence Nicholas imaging of the abdomen was performed both before and after the administration of intravenous contrast. CONTRAST:  36mL MULTIHANCE GADOBENATE  DIMEGLUMINE 529 MG/ML IV SOLN COMPARISON:  12/19/2016 FINDINGS: Lower chest: No acute findings. Hepatobiliary: No mass or other parenchymal abnormality identified. Previous right lobe of liver metastasis has resolved. The gallbladder is normal. No biliary dilatation. Pancreas: No mass, inflammatory changes, or other parenchymal abnormality identified. Spleen:  The spleen is enlarged measuring 18.6 cm in length. Adrenals/Urinary Tract: Normal adrenal glands. Normal appearance of the kidneys. No mass or hydronephrosis. Stomach/Bowel: Visualized portions within the abdomen are unremarkable. Vascular/Lymphatic: No pathologically enlarged lymph nodes identified. No abdominal aortic aneurysm demonstrated. Other:  None. Musculoskeletal: No suspicious bone lesions identified. IMPRESSION: 1. No acute findings within the abdomen. Previously noted liver metastasis has resolved in the interval. No new lesions. Electronically Signed   By: Kerby Moors M.D.   On: 01/11/2017 09:25   Nm Pet Image Restag (ps) Skull Base To Thigh  Result Date: 12/19/2016 CLINICAL DATA:  Subsequent treatment strategy for metastatic colon cancer to the liver. EXAM: NUCLEAR MEDICINE PET SKULL BASE TO THIGH TECHNIQUE: 9.8 mCi F-18 FDG was injected intravenously. Full-ring PET imaging was performed from the skull base to thigh after the radiotracer. CT data was obtained and used for attenuation correction and anatomic localization. FASTING BLOOD GLUCOSE:  Value: 90 mg/dl COMPARISON:  Multiple exams, including 06/08/2016 and prior CT from 09/04/2016 FINDINGS: NECK No hypermetabolic lymph nodes in the neck. CHEST 6 mm in short axis right upper paratracheal lymph node and adjacent 8 mm in short axis right paratracheal lymph node on images 59 and 60 of series 4 demonstrate no significantly accentuated metabolic activity compared to background mediastinal activity. This represents an improvement compared to the prior exam where the activity of these lymph  nodes had a maximum SUV of 6.2, which was above the background mediastinal blood pool activity. A posterior paraesophageal lymph node measures 0.8 cm in short axis on image 76/4 (Formerly 1.2 cm by my measurement) and has a maximum SUV of 3.0 (formerly 6.0). Currently there is no appreciable hypermetabolic adenopathy in the chest. Left Port-A-Cath tip: SVC. Left anterior descending coronary artery atherosclerotic calcification noted mild cardiomegaly. 3 by 4 mm right middle lobe pulmonary nodule on image 50/7 is not appreciably hypermetabolic but is below sensitive PET-CT size thresholds. ABDOMEN/PELVIS At the site of the prior hypermetabolic lesion inferiorly in the right hepatic lobe, no current abnormal hypermetabolic activity is identified. No new liver lesions  observed. No focal splenic abnormality is identified. The spleen measures 10.0 by 4.6 by 15.8 cm (volume = 380 cm^3). Physiologic activity in the bowel. Right hemicolectomy. No current hypermetabolic adenopathy is observed along the porta hepatis. Resolution of prior hypermetabolic adenopathy in the aortocaval space. SKELETON Low-grade diffuse metabolic activity is likely therapy related. No focal hypermetabolic activity to suggest skeletal metastasis. IMPRESSION: 1. Right hemicolectomy, with resolution of the prior hypermetabolic activity inferiorly in the right hepatic lobe, in several mediastinal lymph nodes, and in lymph nodes in the retroperitoneum and porta hepatis. No residual hypermetabolic or enlarged lymph nodes are identified. 2. Low-grade diffuse skeletal metabolic activity is likely therapy related. 3. Coronary atherosclerosis. 4. 3 by 4 mm right middle lobe pulmonary nodule is stable, not appreciably hypermetabolic, but below sensitive PET-CT size thresholds. This may warrant surveillance. Electronically Signed   By: Van Clines M.D.   On: 12/19/2016 15:49    Labs:  CBC:  Recent Labs  11/13/16 1031 12/07/16 0838  12/21/16 0942 01/04/17 0904  WBC 5.6 3.6* 6.0 9.8  HGB 11.8* 12.1* 12.7* 12.1*  HCT 35.1* 37.3* 38.0* 35.7*  PLT 136* 136* 105* 103*    COAGS: No results for input(s): INR, APTT in the last 8760 hours.  BMP:  Recent Labs  06/15/16 0424 06/16/16 0657 06/17/16 0616 06/18/16 0353  11/13/16 1031 12/07/16 0838 12/21/16 0942 01/04/17 0904  NA 136 135 136 136  < > 141 139 140 140  K 5.1 4.0 3.8 3.7  < > 4.6 4.3 4.5 4.7  CL 105 106 106 105  --   --   --   --   --   CO2 25 23 23 24   < > 25 24 23 25   GLUCOSE 142* 106* 111* 96  < > 97 113 105 90  BUN 14 9 <5* <5*  < > 11.7 16.8 15.6 18.4  CALCIUM 8.0* 7.7* 8.1* 8.2*  < > 9.2 9.3 9.2 9.3  CREATININE 1.36* 1.02 1.00 0.86  < > 1.1 1.2 1.1 1.2  GFRNONAA 58* >60 >60 >60  --   --   --   --   --   GFRAA >60 >60 >60 >60  --   --   --   --   --   < > = values in this interval not displayed.  LIVER FUNCTION TESTS:  Recent Labs  11/13/16 1031 12/07/16 0838 12/21/16 0942 01/04/17 0904  BILITOT 0.32 0.59 0.35 0.39  AST 18 20 16 18   ALT 16 24 19 17   ALKPHOS 74 65 102 110  PROT 6.8 6.9 6.8 6.6  ALBUMIN 3.7 3.8 3.6 3.4*    TUMOR MARKERS: No results for input(s): AFPTM, CEA, CA199, CHROMGRNA in the last 8760 hours.  Assessment and Plan:  Nicholas Cerullo is a 56 year old male with known metastatic colorectal carcinoma, right colon primary, with prior imaging showing a right liver lesion in segment 6.   He has been treated with surgical resection of the primary, and is about to start his 13 cycle of chemotherapy. He has had a very nice response with his most recent PET CT showing [place] that he has had resolution of prior metabolic activity, with no new concerns.   I agree with Dr. Ernestina Penna assessment regarding liver directed therapy of the segment 6 lesion.  Dr. Barry Dienes has indicated in prior records that she would defer for ablation over surgery given the positive abdominal node status.  The MRI performed 8/30, however, shows complete  resolution of the segment 6 lesion, which I discussed with [name].  We agree that surveillance is indicated, and that Nicholas Mcbryar can be referred again should any thing change on upcoming imaging studies.   For now we will defer any liver directed therapy.   I discussed all of the findings and the assessment with Nicholas Salazar, and I answered all of his questions to the best of my ability.  I was specific in saying that there are no targets to treat at this time, as he has had complete response on imaging.    For now, we will not schedule a repeat visit.    Plan: - I have encouraged him to observe his follow up appointments with [name], including future surveillance imaging.  [name] will refer back if need be.    Thank you for this interesting consult.  I greatly enjoyed meeting Armend Hochstatter and look forward to participating in their care.  A copy of this report was sent to the requesting provider on this date.  Electronically Signed: Corrie Mckusick 01/16/2017, 4:13 PM   I spent a total of  40 Minutes   in face to face in clinical consultation, greater than 50% of which was counseling/coordinating care for right colon colorectal CA, metastatic to liver, possible liver directed therapy, possible ablation.

## 2017-01-16 NOTE — Progress Notes (Signed)
[place]  [phone] [phone]) [phone]  Clinic Follow Up Note  Patient Care Team: Curlene Labrum, MD as PCP - General (Family Medicine) 01/18/2017  CHIEF COMPLAINTS:  Follow up metastatic right colon cancer   Oncology History   Cancer Staging Metastatic colon cancer to liver [facility]) Staging form: Colon and Rectum, AJCC 8th Edition - Clinical stage from 06/01/2016: Stage IVA (cTX, cNX, pM1a) - Signed by Truitt Merle, MD on 07/04/2016 - Pathologic stage from 06/14/2016: Stage IVA (pT4b(m), pN2b, pM1a) - Signed by Truitt Merle, MD on 07/04/2016       Metastatic colon cancer to liver ([place])   04/2015 Procedure    Colonoscopy by Dr. Ladona Horns. It showed showed 2 sessile polyps ready between 3-5 mm in size located 20 cm (A, B) from the point of entry, polypectomy was performed. Pedunculated polyp was found in the ascending colon (C), polypectomy was performed, and additional polyp (D) was found 30 cm from the point of entry, removed      04/2015 Pathology Results    tubular adenoma (A and B), and well differentiated adenocarcinoma arising from tubulovillous adenoma (C) and well differentiated adenocarcinoma arising from severe dysplasia to intramucosal carcinoma within tubular adenoma.       04/2015 Initial Diagnosis    Metastatic colon cancer to liver ([place])      05/29/2016 Imaging    CT abdomen and pelvis with contrast showed an apple core like stricture in right colon just above the cecum, measuring 3.2 cm in lengths. This is highly suspicious for malignancy. Small lymph node a noticed he had adjacent mesentery, measuring 8 mm. There is a low-density lesion within the inferior aspect of the right hepatic lobe measuring 1.7 cm, suspicious for metastasis.       06/06/2016 Tumor Marker    CEA 9.99      06/08/2016 PET scan    IMPRESSION: Approximately 3 cm hypermetabolic mass in the ascending colon, consistent with primary colon carcinoma. This mass results in  colonic obstruction and small bowel dilatation. Additional areas of hypermetabolic wall thickening in the cecum may represent other sites of colon carcinoma or colitis. Mild hypermetabolic lymphadenopathy in right pericolonic region, porta hepatis, and aortocaval space, consistent with metastatic disease. Mild hypermetabolic mediastinal lymphadenopathy also seen, and thoracic lymph node metastases cannot be excluded. Solitary hypermetabolic focus in inferior right hepatic lobe, consistent with liver metastasis. Consider abdomen MRI without and with contrast for further evaluation.      06/14/2016 Surgery    Hand assisted right hemicolectomy and small bowel resection for colon cancer, liver biopsy, by Dr. Barry Dienes      06/14/2016 Pathology Results    Right hemicolectomy showed invasive well to moderately differentiated adenocarcinoma, 2 foci measuring 7.5 cm and 4.5 cm, tumor invades through full thickness of colon, to the seroma and involve the Small Bowel, Surgical Margins Were Negative, 24 Out Of 64 Lymph Nodes Were Positive, Extracapsular Extension Identified, Multiple Satellite Tumor Deposits Present, Liver Biopsy Showed Metastatic Adenocarcinoma.        06/14/2016 Miscellaneous    Tumor MMR normal, MSI stable       06/14/2016 Miscellaneous    Foundation one genomic testing showed K-ras G12 D mutation, APC and TP53 mutation. No BRAF and NRAS mutation. MSI-stable, tumor burden low.      07/06/2016 Tumor Marker    CEA 13.69      07/13/2016 -  Chemotherapy    mFOLFOX, every 2 weeks, started on 07/14/2015, Avastin added from  cycle 3  Oxaliplatin dose to '60mg'$ /m2 due to side effects and some cytopenia on 09/21/16  Changed to FOLFIRINOX starting cycle 7 and Reduced  Due to neuropathy hold Oxaliplatin and add Irinotecan with neulasta on day 3 starting with cycle 7 Add low dose Oxaliplatin with cycle 8.         07/27/2016 Tumor Marker    CEA 15.85      09/04/2016 Imaging    Ct C/A/P W  Contrast IMPRESSION: Interval right colectomy. Stable small liver metastasis in the inferior right hepatic lobe. Stable mild porta hepatis and aortocaval lymphadenopathy. Stable mild mediastinal lymphadenopathy. No new or progressive metastatic disease identified within the chest, abdomen, or pelvis.      12/19/2016 PET scan    IMPRESSION: 1. Right hemicolectomy, with resolution of the prior hypermetabolic activity inferiorly in the right hepatic lobe, in several mediastinal lymph nodes, and in lymph nodes in the retroperitoneum and porta hepatis. No residual hypermetabolic or enlarged lymph nodes are identified. 2. Low-grade diffuse skeletal metabolic activity is likely therapy related. 3. Coronary atherosclerosis. 4. 3 by 4 mm right middle lobe pulmonary nodule is stable, not appreciably hypermetabolic, but below sensitive PET-CT size thresholds. This may warrant surveillance.       HISTORY OF PRESENTING ILLNESS (06/01/2016):  Nicholas Lowery 56 y.o. male is here because of his recently abdominal CT which is highly suspicious for metastatic colon cancer. He is accompanied by his wife to my clinic today. He was referred by his primary care physician Dr. Pleas Koch.   He had colonoscopy in 2016 for mild rectal bleeding, he describe small amount fresh blood mixed with stool, he has no other constitutional symptoms at that time. He was referred to gastroenterologist Dr. Ladona Horns and underwent a colonoscopy in December 2016. The colonoscopy showed 2 sessile polyps ready between 3-5 mm in size located 20 cm (A, B) from the point of entry, polypectomy was performed. Pedunculated polyp was found in the ascending colon (C), polypectomy was performed, and additional polyp (D) was found 30 cm from the point of entry, removed. The pathology reviewed tubular adenoma (A and B), and well differentiated adenocarcinoma arising from tubulovillous adenoma (C) and well differentiated adenocarcinoma arising from  severe dysplasia to intramucosal carcinoma within tubular adenoma. Dr. Tye [place] tried multiple times to reach patient, but patient sought they were calling him about the bill, and did not return the phone calls. He was not aware the cancer diagnosis until recently.   He started having diarrhea and vomiting in mid Dec 2017, and felt a "pop" in right side abdomen, he was seen at urgent care, and was treated with antiemetics, and IVF, lab tests were OK. Due to his persistent intermittent diarrhea and epigastric pain since then, he was seen by PCP and he eventually had CT abdomen and pelvis scan which showed a upper core lesion in the ascending colon, and I'll 1.7 cm lesion in the liver, highly suspicious for metastasis. He was referred to [place] for further evaluation.  He has lost 30 lbs in the past one month, has low appetite, eats a small meals 1-2 times a day. He has moderate fatigue, able to tolerate routine activities including his work, but feels exhausted at the end of study. He has occasional constipation, denies recent rectal bleeding.  CURRENT THERAPY: mFOLFOX, every 2 weeks, started on 07/14/2015, Avastin added from cycle 3. Lower dose of Oxaliplatin 09/21/16. Changed to FOLFIRINOX on 10/05/16,  Add neulasta day 3 starting with cycle 7, oxaliplatin held  due to neuropathy from 12/21/2016. Will be changed to maintenance therapy, Xeloda 1000 mg per m/s on day 1-14 every 21 days starting in 2 weeks plus AVASTIN.    INTERIM HISTORY:  Of note, since the patient last visit he had a MR Abdomen W WO Contrast completed on 01/11/2017 with results revealing: No acute findings within the abdomen. Previously noted liver metastasis has resolved in the interval. No new lesions.  Nicholas Lowery returns today for follow up and treatment. He reports that he is doing well overall. He tolerated 5FU and Avastin 2 weeks ago. Irinotecan was held due to neuropathy in his groin area. He reports that issue is unchanged today, although he "can't  perform." He inquires about taking Viagra. His BP is elevated today but has been out of Norvasc for 2 days. He asks if he needs both Norvasc and Lisinopril currently. He had a MRI abdomen on 01/11/17 that showed no findings in the abdomen. Previously noted liver metastasis has resolved and no new lesions noted. He reports that he would like to have an unbiased opinion on the status of his cancer and the steps moving forward. He spoke to Dr. Earleen Newport, interventional radiologist on 01/16/17, who informed him the procedure could not be done in the office. He has an appointment to see Dr. Barry Dienes in 2 weeks.   On review of systems, he reports continued numbness to his genital region. Denies bowel/bladder issues.    MEDICAL HISTORY:  Past Medical History:  Diagnosis Date  . Anxiety   . Cancer of ascending colon ([place])   . Seasonal allergies     SURGICAL HISTORY: Past Surgical History:  Procedure Laterality Date  . APPENDECTOMY  1992  . CATARACT EXTRACTION W/ INTRAOCULAR LENS IMPLANT Left 04/2016  . COLON SURGERY    . COLONOSCOPY W/ BIOPSIES AND POLYPECTOMY  04/2015  . INGUINAL HERNIA REPAIR Right 1982  . KNEE ARTHROSCOPY W/ MENISCECTOMY Right 1998  . LAPAROSCOPIC RIGHT COLECTOMY N/A 06/14/2016   Procedure: LAPAROSCOPIC HAND ASSISTED HEMICOLECTOMY AND SMALL BOWEL RESECTION.;  Surgeon: [name], MD;  Location: [place];  Service: General;  Laterality: N/A;  . LIVER BIOPSY Right 06/14/2016   Procedure: LIVER BIOPSY;  Surgeon: [name], MD;  Location: [place];  Service: General;  Laterality: Right;  Right Inferior Liver  . PORTACATH PLACEMENT N/A 07/06/2016   Procedure: INSERTION PORT-A-CATH;  Surgeon: [name], MD;  Location: [place];  Service: General;  Laterality: N/A;  . VASECTOMY  2005    SOCIAL HISTORY: Social History   Social History  . Marital status: Married    Spouse name: N/A  . Number of children: N/A  . Years of education: N/A   Occupational History  . Not  on file.   Social History Main Topics  . Smoking status: Former Smoker    Years: 2.00    Types: Cigarettes    Quit date: 81  . Smokeless tobacco: Never Used  . Alcohol use 2.4 oz/week    4 Cans of beer per week     Comment: social, none since colon surgery  . Drug use: No     Comment: 06/15/2016 "nothing since college"  . Sexual activity: Yes   Other Topics Concern  . Not on file   Social History Narrative  . No narrative on file   He is married. They have 3 boys, 65-12 yo. He works for a [employer], desk job.   FAMILY HISTORY: Family History  Problem Relation Age of  Onset  . Cancer Mother        lung cancer  . Stroke Mother   . Hypertension Father   . CAD Father   . Cancer Maternal Grandfather        prostate cancer     ALLERGIES:  is allergic to penicillins.  MEDICATIONS:  Current Outpatient Prescriptions  Medication Sig Dispense Refill  . amLODipine (NORVASC) 10 MG tablet Take 1 tablet (10 mg total) by mouth daily. 30 tablet 2  . celecoxib (CELEBREX) 200 MG capsule Take 200 mg by mouth daily.  1  . dexamethasone (DECADRON) 4 MG tablet For 5 days after chemo (begin day 2) 20 tablet 0  . diphenoxylate-atropine (LOMOTIL) 2.5-0.025 MG tablet Take 2 tablets by mouth 4 (four) times daily as needed for diarrhea or loose stools. 30 tablet 0  . docusate sodium (COLACE) 100 MG capsule Take 100 mg by mouth as needed.     Marland Kitchen FLUoxetine (PROZAC) 40 MG capsule Take 40 mg by mouth daily.    Marland Kitchen lidocaine-prilocaine (EMLA) cream Apply to affected area once 30 g 3  . lisinopril (PRINIVIL) 10 MG tablet Take 1 tablet (10 mg total) by mouth daily. 90 tablet 0  . LORazepam (ATIVAN) 0.5 MG tablet Take 1 tablet (0.5 mg total) by mouth every 8 (eight) hours as needed (for nausea). 30 tablet 0  . ondansetron (ZOFRAN ODT) 8 MG disintegrating tablet Take 1 tablet (8 mg total) by mouth every 8 (eight) hours as needed for nausea or vomiting. 30 tablet 2  . prochlorperazine (COMPAZINE)  10 MG tablet Take 1 tablet (10 mg total) by mouth every 6 (six) hours as needed for nausea or vomiting. 30 tablet 2  . traMADol (ULTRAM) 50 MG tablet Take 1-2 tablets (50-100 mg total) by mouth every 6 (six) hours as needed for moderate pain or severe pain. 30 tablet 0   No current facility-administered medications for this visit.    Facility-Administered Medications Ordered in Other Visits  Medication Dose Route Frequency Provider Last Rate Last Dose  . clindamycin (CLEOCIN) 900 mg in dextrose 5 % 50 mL IVPB  900 mg Intravenous 60 min Pre-Op [name], MD       And  . gentamicin (GARAMYCIN) 450 mg in dextrose 5 % 50 mL IVPB  5 mg/kg Intravenous 60 min Pre-Op [name], MD        REVIEW OF SYSTEMS: Constitutional: Denies fevers, chills or abnormal night sweats  (+)slightly improved fatigue Eyes: Denies blurriness of vision, double vision or watery eyes Ears, nose, mouth, throat, and face: Denies mucositis or sore throat  Respiratory: Denies cough, dyspnea or wheezes Cardiovascular: Denies palpitation, chest discomfort or lower extremity swelling Gastrointestinal:  Denies heartburn or change in bowel habits (+)improved nausea and diarrhea Skin:normal Lymphatics: Denies new lymphadenopathy or easy bruising Neurological: (+) numbness to genital region MSK: Muscle weakness in hands Behavioral/Psych: Mood is stable, no new changes  All other systems were reviewed with the patient and are negative.  PHYSICAL EXAMINATION: ECOG PERFORMANCE STATUS: 1 - Symptomatic but completely ambulatory  Vitals:   01/18/17 0937  BP: 128/79  Pulse: 78  Resp: 18  Temp: 98.1 F (36.7 C)  TempSrc: Oral  SpO2: 100%  Weight: 201 lb 8 oz (91.4 kg)  Height: '5\' 11"'$  (1.803 m)   . GENERAL:alert, no distress and comfortable. SKIN: skin color, texture, turgor are normal, no rashes or significant lesions EYES: normal, conjunctiva are pink and non-injected, sclera clear OROPHARYNX:no exudate, no  erythema  and lips, buccal mucosa, and tongue normal  NECK: supple, thyroid normal size, non-tender, without nodularity LYMPH:  no palpable lymphadenopathy in the cervical, axillary or inguinal LUNGS: clear to auscultation and percussion with normal breathing effort HEART: regular rate & rhythm and no murmurs and no lower extremity edema ABDOMEN:abdomen soft, Surgical scar in the midline around the umbilical has healed well. non-tender and normal bowel sounds Musculoskeletal:no cyanosis of digits and no clubbing  PSYCH: alert & oriented x 3 with fluent speech NEURO: no focal motor/sensory deficits (+) mildly decreased bilateral vibration sensation in feet  LABORATORY DATA:  I have reviewed the data as listed CBC Latest Ref Rng & Units 01/04/2017 12/21/2016 12/07/2016  WBC 4.0 - 10.3 10e3/uL 9.8 6.0 3.6(L)  Hemoglobin 13.0 - 17.1 g/dL 12.1(L) 12.7(L) 12.1(L)  Hematocrit 38.4 - 49.9 % 35.7(L) 38.0(L) 37.3(L)  Platelets 140 - 400 10e3/uL 103(L) 105(L) 136(L)   CMP Latest Ref Rng & Units 01/04/2017 12/21/2016 12/07/2016  Glucose 70 - 140 mg/dl 90 105 113  BUN 7.0 - 26.0 mg/dL 18.4 15.6 16.8  Creatinine 0.7 - 1.3 mg/dL 1.2 1.1 1.2  Sodium 136 - 145 mEq/L 140 140 139  Potassium 3.5 - 5.1 mEq/L 4.7 4.5 4.3  Chloride 101 - 111 mmol/L - - -  CO2 22 - 29 mEq/L '25 23 24  '$ Calcium 8.4 - 10.4 mg/dL 9.3 9.2 9.3  Total Protein 6.4 - 8.3 g/dL 6.6 6.8 6.9  Total Bilirubin 0.20 - 1.20 mg/dL 0.39 0.35 0.59  Alkaline Phos 40 - 150 U/L 110 102 65  AST 5 - 34 U/L '18 16 20  '$ ALT 0 - 55 U/L '17 19 24   '$ PATHOLOGY   Diagnosis 06/14/2016 1. Colon, segmental resection for tumor, Right Ascending Hemicolectomy and Small Bowel - INVASIVE WELL TO MODERATELY DIFFERENTIATED ADENOCARCINOMA. - TWO TUMOR FOCI MEASURING 7.5 CM AND 4.5 CM IN GREATEST DIMENSION. - TUMOR INVADES THROUGH FULL THICKNESS OF COLON, THROUGH THE SEROSA TO INVOLVE THE SMALL BOWEL. - MARGINS ARE NEGATIVE. - TWENTY FOUR OF SIXTY FOUR LYMPH NODES POSITIVE  FOR METASTATIC ADENOCARCINOMA (24/64). - EXTRACAPSULAR EXTENSION IDENTIFIED - MULTIPLE SATELLITE TUMOR DEPOSITS PRESENT. - SEE ONCOLOGY TEMPLATE. 2. Liver, needle/core biopsy, Right Inferior - POSITIVE FOR METASTATIC ADENOCARCINOMA.    RADIOGRAPHIC STUDIES: I have personally reviewed his outside CT scan from 05/29/2016 and agreed with the findings in the report. Mr Abdomen W Wo Contrast  Result Date: 01/11/2017 CLINICAL DATA:  Adenocarcinoma of the colon  with liver metastases. EXAM: MRI ABDOMEN WITHOUT AND WITH CONTRAST TECHNIQUE: Multiplanar multisequence MR imaging of the abdomen was performed both before and after the administration of intravenous contrast. CONTRAST:  78m MULTIHANCE GADOBENATE DIMEGLUMINE 529 MG/ML IV SOLN COMPARISON:  12/19/2016 FINDINGS: Lower chest: No acute findings. Hepatobiliary: No mass or other parenchymal abnormality identified. Previous right lobe of liver metastasis has resolved. The gallbladder is normal. No biliary dilatation. Pancreas: No mass, inflammatory changes, or other parenchymal abnormality identified. Spleen:  The spleen is enlarged measuring 18.6 cm in length. Adrenals/Urinary Tract: Normal adrenal glands. Normal appearance of the kidneys. No mass or hydronephrosis. Stomach/Bowel: Visualized portions within the abdomen are unremarkable. Vascular/Lymphatic: No pathologically enlarged lymph nodes identified. No abdominal aortic aneurysm demonstrated. Other:  None. Musculoskeletal: No suspicious bone lesions identified. IMPRESSION: 1. No acute findings within the abdomen. Previously noted liver metastasis has resolved in the interval. No new lesions. Electronically Signed   By: TKerby MoorsM.D.   On: 01/11/2017 09:25   Nm Pet Image Restag (ps) Skull  Base To Thigh  Result Date: 12/19/2016 CLINICAL DATA:  Subsequent treatment strategy for metastatic colon cancer to the liver. EXAM: NUCLEAR MEDICINE PET SKULL BASE TO THIGH TECHNIQUE: 9.8 mCi F-18 FDG was  injected intravenously. Full-ring PET imaging was performed from the skull base to thigh after the radiotracer. CT data was obtained and used for attenuation correction and anatomic localization. FASTING BLOOD GLUCOSE:  Value: 90 mg/dl COMPARISON:  Multiple exams, including 06/08/2016 and prior CT from 09/04/2016 FINDINGS: NECK No hypermetabolic lymph nodes in the neck. CHEST 6 mm in short axis right upper paratracheal lymph node and adjacent 8 mm in short axis right paratracheal lymph node on images 59 and 60 of series 4 demonstrate no significantly accentuated metabolic activity compared to background mediastinal activity. This represents an improvement compared to the prior exam where the activity of these lymph nodes had a maximum SUV of 6.2, which was above the background mediastinal blood pool activity. A posterior paraesophageal lymph node measures 0.8 cm in short axis on image 76/4 (Formerly 1.2 cm by my measurement) and has a maximum SUV of 3.0 (formerly 6.0). Currently there is no appreciable hypermetabolic adenopathy in the chest. Left Port-A-Cath tip: SVC. Left anterior descending coronary artery atherosclerotic calcification noted mild cardiomegaly. 3 by 4 mm right middle lobe pulmonary nodule on image 50/7 is not appreciably hypermetabolic but is below sensitive PET-CT size thresholds. ABDOMEN/PELVIS At the site of the prior hypermetabolic lesion inferiorly in the right hepatic lobe, no current abnormal hypermetabolic activity is identified. No new liver lesions observed. No focal splenic abnormality is identified. The spleen measures 10.0 by 4.6 by 15.8 cm (volume = 380 cm^3). Physiologic activity in the bowel. Right hemicolectomy. No current hypermetabolic adenopathy is observed along the porta hepatis. Resolution of prior hypermetabolic adenopathy in the aortocaval space. SKELETON Low-grade diffuse metabolic activity is likely therapy related. No focal hypermetabolic activity to suggest skeletal  metastasis. IMPRESSION: 1. Right hemicolectomy, with resolution of the prior hypermetabolic activity inferiorly in the right hepatic lobe, in several mediastinal lymph nodes, and in lymph nodes in the retroperitoneum and porta hepatis. No residual hypermetabolic or enlarged lymph nodes are identified. 2. Low-grade diffuse skeletal metabolic activity is likely therapy related. 3. Coronary atherosclerosis. 4. 3 by 4 mm right middle lobe pulmonary nodule is stable, not appreciably hypermetabolic, but below sensitive PET-CT size thresholds. This may warrant surveillance. Electronically Signed   By: Van Clines M.D.   On: 12/19/2016 15:49   MR Abdomen W WO Contrast 01/11/2017 IMPRESSION: 1. No acute findings within the abdomen. Previously noted liver metastasis has resolved in the interval. No new lesions.  CT CAP 09/04/16 IMPRESSION: Interval right colectomy. Stable small liver metastasis in the inferior right hepatic lobe. Stable mild porta hepatis and aortocaval lymphadenopathy. Stable mild mediastinal lymphadenopathy. No new or progressive metastatic disease identified within the chest, abdomen, or pelvis.  ASSESSMENT & PLAN:  56 y.o. Caucasian male, without significant past medical history, presented with rectal bleeding in December 2016, colonoscopy showed 4 polyps, 2 of them showed invasive adenocarcinoma, unfortunately he was not aware of the pathology findings and was not treated. He now presented with fatigue, weight loss, anorexia and abdominal pain  1. Right colon cancer with liver and node metastasis, pT4bN2bM1a, stage IV, MSI-stable, KRAS mutation (+) -I previously reviewed her PET scan findings, which showed solitary liver metastasis, abdominal and possible thoracic node metastasis  -His liver biopsy confirmed metastasis -Due to the bowel obstruction, he underwent upfront hemicolectomy. I previously reviewed his  surgical pathology findings, which showed 2 primary right colon  cancer, very locally advanced with 24 lymph nodes positive, surgical margins were negative. -I previously discussed his [facility] one genomic testing results, which showed care arrest mutation, and as high stable, low tumor burden. So he would not benefit from EGFR inhibitor, or immunotherapy alone. -The was started on palliative first-line chemotherapy FOLFOX. Avastin was added from cycle 3 -He has solitary liver metastasis, but multiple mild hypermetabolic adenopathy in mediastinum and abdomen. We previously discussed the possible local therapy options, such as liver metastectomy or ablation if he has great response to chemo and his node metastasis resolves  -restaging CT from 09/04/2016 reviewed with pt, stable disease. Will continue chemo -he developed side effects from FOLFOX, and a worsening thrombocytopenia, despite reduced dose of oxaliplatin  -Due to his overall limited response to FOLFOX, and his wish to pursue surgery, I previously recommend him to change chemo to FOLFIRINOX with low dose  oxaliplatin, hopefully he will response better.  -Due to his severe nausea and vomiting, anorexia, mild diarrhea, I'll reduce his irinotecan dose from cycle 10  - PET scan from 12/19/2016 reviewed in person. He had near complete metabolic response from chemotherapy, post his liver metastasis and hypermetabolic lymph nodes are negative on PET scan with decreased size.  -Due to his worsening neuropathy, and good response to chemotherapy, I previously stopped oxaliplatin from cycle 11, and continue FOLFIRI and avastin   - I previously presented his case to the GI Tumor Board and discuss liver targeted therapy vs resection. Given lymph node metastasis, I favored liver ablation by interventional radiology.  -Reviewed his abdominal MRI findings, which showed no visible lesions in the liver. He has had a complete radiographic response -He was seen by interventional radiologist Dr. Earleen Newport, who does not think he need  liver ablation since the solid liver met has resolved after chemo. I agree with his plan.  -Given his excellent response to chemotherapy, and the poor tolerance to intravenous chemotherapy, I recommend him to change treatments to maintenance therapy -Discussed 5FU versus Xeloda for maintenance including potential side effects of Xeloda including but not limited to mouth sores, diarrhea, and skin peeling/blisters. He is aware and agrees to proceed with Xeloda, he appreciates the convenience of pills instead of infusion/pump.  -Will change the patient to maintenance therapy,  Xeloda 1000 mg per m/s on day 1-14 every 21 days starting in 2 weeks plus AVASTIN.  - He will receive 5FU and Avastin today since Xeloda is not available to start today. I will prescribe that so he can request co-pay assistance. Aim to start Xeloda in 2 weeks.  Due to worsening neuropathy, I will hold irinotecan today and continue with 5-FU and Avastin. I'll likely hold his Avastin also in the near future due to the pending liver ablation or surgery. - will continue monitoring closely with scans every 3 months.    2. Hypertension -His blood pressure has been high lately, I have previously given him clonidine 0.1 mg in the infusion room before Avastin -We previously discussed Avastin can cause hypertension -I have started him on amlodipine, and increase dose to 10 mg daily -He does not want to follow-up with his primary care physician. -Previously, his blood pressure was noted to be elevated, especially diastolic. I previously added on lisinopril 10 mg daily for him -Patient blood pressure noted to be 128/79 today 01/18/2017. -Continue Norvasc and Lisinopril for now while on Avastin -Will continue to monitor -continue Amlodipine '10mg'$   dialy, BP better controlled   3. Anemia of iron deficiency and chemo  -He has mild anemia, likely related to his colon cancer bleeding. -I previously recommend him to take oral iron supplement  over-the-counter, potential side effects of constipation and gastric or discomfort or discussed with him. He stopped taking this before surgery. -I previously advised him to begin taking oral iron supplements again. - Iron levels previously on 08/24/16 at 46. -Iron levels previously on 01/04/17 at 92. - We will continue to monitor iron levels  -Hgb stable today, 11.7  4. Anorexia and weight loss -Secondary to underlying malignancy -I previously encouraged him to try nutritional supplement, such as boost or initial -The patient has previously gained some weight .  5. Peripheral neuropathy, G2 -Secondary to oxaliplatin, dose reduced and ultimately discontinued -Slightly worse with FOLFIRI, held Irinotecan beginning 01/04/17 -Unchanged today, discussed with patient this may take some time to resolve.  -Will continue to monitor.  6. Goal of care discussion  -We previously discussed the incurable nature of his cancer, and the overall poor prognosis, especially if he does not have good response to chemotherapy or progress on chemo -The patient understands the goal of care is palliative.  -He is full code now     Plan  -Lab reviewed, adequate for treatment, we'll proceed 5-FU and Avastin today  -Prescription Xeloda '2000mg'$  q12h, 2 weeks on and 1 week off, was called in today, plan to start in 2 weeks  -labs, flush, f/u and chemo AVASTIN in 2 and 4 weeks   No orders of the defined types were placed in this encounter.   All questions were answered.   The patient knows to call the clinic with any problems, questions or concerns.  I spent 25 minutes counseling the patient face to face. The total time spent in the appointment was 30 minutes and more than 50% was on counseling.  This document serves as a record of services personally performed by Truitt Merle, MD. It was created on her behalf by Steva Colder, a trained medical scribe. The creation of this record is based on the scribe's personal  observations and the provider's statements to them. This document has been checked and approved by the attending provider  Truitt Merle  01/18/2017

## 2017-01-18 ENCOUNTER — Telehealth: Payer: Self-pay | Admitting: Pharmacy Technician

## 2017-01-18 ENCOUNTER — Ambulatory Visit: Payer: BLUE CROSS/BLUE SHIELD

## 2017-01-18 ENCOUNTER — Telehealth: Payer: Self-pay | Admitting: Pharmacist

## 2017-01-18 ENCOUNTER — Ambulatory Visit (HOSPITAL_BASED_OUTPATIENT_CLINIC_OR_DEPARTMENT_OTHER): Payer: BLUE CROSS/BLUE SHIELD | Admitting: Hematology

## 2017-01-18 ENCOUNTER — Other Ambulatory Visit (HOSPITAL_BASED_OUTPATIENT_CLINIC_OR_DEPARTMENT_OTHER): Payer: BLUE CROSS/BLUE SHIELD

## 2017-01-18 ENCOUNTER — Ambulatory Visit (HOSPITAL_BASED_OUTPATIENT_CLINIC_OR_DEPARTMENT_OTHER): Payer: BLUE CROSS/BLUE SHIELD

## 2017-01-18 ENCOUNTER — Encounter: Payer: Self-pay | Admitting: Hematology

## 2017-01-18 VITALS — BP 124/80 | HR 61

## 2017-01-18 VITALS — BP 128/79 | HR 78 | Temp 98.1°F | Resp 18 | Ht 71.0 in | Wt 201.5 lb

## 2017-01-18 DIAGNOSIS — C779 Secondary and unspecified malignant neoplasm of lymph node, unspecified: Secondary | ICD-10-CM

## 2017-01-18 DIAGNOSIS — C182 Malignant neoplasm of ascending colon: Secondary | ICD-10-CM

## 2017-01-18 DIAGNOSIS — Z5111 Encounter for antineoplastic chemotherapy: Secondary | ICD-10-CM | POA: Diagnosis not present

## 2017-01-18 DIAGNOSIS — R197 Diarrhea, unspecified: Secondary | ICD-10-CM

## 2017-01-18 DIAGNOSIS — Z7189 Other specified counseling: Secondary | ICD-10-CM | POA: Diagnosis not present

## 2017-01-18 DIAGNOSIS — C189 Malignant neoplasm of colon, unspecified: Secondary | ICD-10-CM

## 2017-01-18 DIAGNOSIS — Z95828 Presence of other vascular implants and grafts: Secondary | ICD-10-CM

## 2017-01-18 DIAGNOSIS — C787 Secondary malignant neoplasm of liver and intrahepatic bile duct: Secondary | ICD-10-CM

## 2017-01-18 DIAGNOSIS — D509 Iron deficiency anemia, unspecified: Secondary | ICD-10-CM

## 2017-01-18 DIAGNOSIS — G620 Drug-induced polyneuropathy: Secondary | ICD-10-CM

## 2017-01-18 DIAGNOSIS — D6481 Anemia due to antineoplastic chemotherapy: Secondary | ICD-10-CM | POA: Diagnosis not present

## 2017-01-18 DIAGNOSIS — Z5112 Encounter for antineoplastic immunotherapy: Secondary | ICD-10-CM

## 2017-01-18 DIAGNOSIS — I10 Essential (primary) hypertension: Secondary | ICD-10-CM

## 2017-01-18 DIAGNOSIS — D500 Iron deficiency anemia secondary to blood loss (chronic): Secondary | ICD-10-CM

## 2017-01-18 LAB — CBC WITH DIFFERENTIAL/PLATELET
BASO%: 1.2 % (ref 0.0–2.0)
Basophils Absolute: 0.1 10*3/uL (ref 0.0–0.1)
EOS%: 1.3 % (ref 0.0–7.0)
Eosinophils Absolute: 0.1 10*3/uL (ref 0.0–0.5)
HCT: 34.2 % — ABNORMAL LOW (ref 38.4–49.9)
HGB: 11.7 g/dL — ABNORMAL LOW (ref 13.0–17.1)
LYMPH%: 7.2 % — AB (ref 14.0–49.0)
MCH: 33.5 pg — ABNORMAL HIGH (ref 27.2–33.4)
MCHC: 34.1 g/dL (ref 32.0–36.0)
MCV: 98.2 fL — ABNORMAL HIGH (ref 79.3–98.0)
MONO#: 0.7 10*3/uL (ref 0.1–0.9)
MONO%: 9.8 % (ref 0.0–14.0)
NEUT%: 80.5 % — AB (ref 39.0–75.0)
NEUTROS ABS: 6.1 10*3/uL (ref 1.5–6.5)
PLATELETS: 121 10*3/uL — AB (ref 140–400)
RBC: 3.48 10*6/uL — AB (ref 4.20–5.82)
RDW: 16.9 % — ABNORMAL HIGH (ref 11.0–14.6)
WBC: 7.6 10*3/uL (ref 4.0–10.3)
lymph#: 0.5 10*3/uL — ABNORMAL LOW (ref 0.9–3.3)

## 2017-01-18 LAB — COMPREHENSIVE METABOLIC PANEL
ALT: 16 U/L (ref 0–55)
AST: 20 U/L (ref 5–34)
Albumin: 3.4 g/dL — ABNORMAL LOW (ref 3.5–5.0)
Alkaline Phosphatase: 116 U/L (ref 40–150)
Anion Gap: 10 mEq/L (ref 3–11)
BUN: 16.3 mg/dL (ref 7.0–26.0)
CHLORIDE: 107 meq/L (ref 98–109)
CO2: 22 meq/L (ref 22–29)
CREATININE: 1.2 mg/dL (ref 0.7–1.3)
Calcium: 9.1 mg/dL (ref 8.4–10.4)
EGFR: 67 mL/min/{1.73_m2} — ABNORMAL LOW (ref >90)
GLUCOSE: 143 mg/dL — AB (ref 70–140)
Potassium: 4 mEq/L (ref 3.5–5.1)
SODIUM: 139 meq/L (ref 136–145)
TOTAL PROTEIN: 6.7 g/dL (ref 6.4–8.3)
Total Bilirubin: 0.51 mg/dL (ref 0.20–1.20)

## 2017-01-18 MED ORDER — BEVACIZUMAB CHEMO INJECTION 400 MG/16ML
5.0000 mg/kg | Freq: Once | INTRAVENOUS | Status: AC
  Administered 2017-01-18: 450 mg via INTRAVENOUS
  Filled 2017-01-18: qty 2

## 2017-01-18 MED ORDER — LEUCOVORIN CALCIUM INJECTION 350 MG
400.0000 mg/m2 | Freq: Once | INTRAVENOUS | Status: AC
  Administered 2017-01-18: 840 mg via INTRAVENOUS
  Filled 2017-01-18: qty 42

## 2017-01-18 MED ORDER — DEXAMETHASONE 4 MG PO TABS: ORAL_TABLET | ORAL | 0 refills | Status: DC

## 2017-01-18 MED ORDER — ONDANSETRON HCL 8 MG PO TABS
ORAL_TABLET | ORAL | Status: AC
  Filled 2017-01-18: qty 1

## 2017-01-18 MED ORDER — ONDANSETRON HCL 8 MG PO TABS
8.0000 mg | ORAL_TABLET | Freq: Once | ORAL | Status: AC
  Administered 2017-01-18: 8 mg via ORAL

## 2017-01-18 MED ORDER — CAPECITABINE 500 MG PO TABS: 1000.0000 mg/m2 | ORAL_TABLET | Freq: Two times a day (BID) | ORAL | 2 refills | Status: DC

## 2017-01-18 MED ORDER — AMLODIPINE BESYLATE 10 MG PO TABS: 10.0000 mg | ORAL_TABLET | Freq: Every day | ORAL | 2 refills | Status: DC

## 2017-01-18 MED ORDER — ONDANSETRON 8 MG PO TBDP: 8.0000 mg | ORAL_TABLET | Freq: Three times a day (TID) | ORAL | 2 refills | Status: DC | PRN

## 2017-01-18 MED ORDER — SODIUM CHLORIDE 0.9 % IV SOLN
2375.0000 mg/m2 | INTRAVENOUS | Status: DC
  Administered 2017-01-18: 5000 mg via INTRAVENOUS
  Filled 2017-01-18: qty 100

## 2017-01-18 MED ORDER — SODIUM CHLORIDE 0.9 % IV SOLN
Freq: Once | INTRAVENOUS | Status: AC
  Administered 2017-01-18: 10:00:00 via INTRAVENOUS

## 2017-01-18 MED ORDER — SODIUM CHLORIDE 0.9% FLUSH
10.0000 mL | Freq: Once | INTRAVENOUS | Status: AC
  Administered 2017-01-18: 10 mL
  Filled 2017-01-18: qty 10

## 2017-01-18 NOTE — Telephone Encounter (Signed)
Oral Oncology Pharmacist Encounter  Received new prescription for Xeloda for the treatment of metastatic colon cancer in conjunction with Avastin, planned duration until disease progression or unacceptable toxicity.  Labs from 01/18/17 assessed, OK for treatment.  Current medication list in Epic reviewed, no significant DDIs with Xeloda identified.  Prescription has been e-scribed to [facility] in [place], [place] (ph: [phone]) per insurance requirement.   I spoke with patient for overview of new oral chemotherapy medication: Xeloda.   Counseled patient on administration, dosing, side effects, safe handling, and monitoring. Patient will take Xeloda 500mg  tablets, 4 tablets (2000mg ) by mouth in AM and 4 tabs (2000mg ) by mouth in PM, within 30 minutes of finishing meals, for 14 days on 7 days off, repeat every 21 days. Xeloda planned start date: 02/01/17  Side effects include but not limited to: fatigue, decerased blood counts, GI upset, diarrhea, and hand-foot syndrome. Patient has loperamide at home and will call the office if diarrhea develops.    Reviewed with patient importance of keeping a medication schedule and plan for any missed doses.  Mr. Hollingshed voiced understanding and appreciation.   All questions answered.  Patient knows to call the office with questions or concerns. I provided patient will phone number to [facility].  Oral Oncology Clinic will continue to follow for insurance authorization, copayment issues, and start date.  Thank you,  Johny Drilling, PharmD, BCPS, BCOP 01/22/2017  10:02 AM Oral Oncology Clinic [phone]

## 2017-01-18 NOTE — Patient Instructions (Signed)
[facility] Discharge Instructions for Patients Receiving Chemotherapy  Today you received the following chemotherapy agents: Avastin, Leucovorin, Adrucil   To help prevent nausea and vomiting after your treatment, we encourage you to take your nausea medication as directed.    If you develop nausea and vomiting that is not controlled by your nausea medication, call the clinic.   BELOW ARE SYMPTOMS THAT SHOULD BE REPORTED IMMEDIATELY:  *FEVER GREATER THAN 100.5 F  *CHILLS WITH OR WITHOUT FEVER  NAUSEA AND VOMITING THAT IS NOT CONTROLLED WITH YOUR NAUSEA MEDICATION  *UNUSUAL SHORTNESS OF BREATH  *UNUSUAL BRUISING OR BLEEDING  TENDERNESS IN MOUTH AND THROAT WITH OR WITHOUT PRESENCE OF ULCERS  *URINARY PROBLEMS  *BOWEL PROBLEMS  UNUSUAL RASH Items with * indicate a potential emergency and should be followed up as soon as possible.  Feel free to call the clinic you have any questions or concerns. The clinic phone number is [phone].  Please show the CHEMO ALERT CARD at check-in to the Emergency Department and triage nurse.   

## 2017-01-18 NOTE — Telephone Encounter (Signed)
Oral Oncology Patient Advocate Encounter  Received notification from [employer] that prior authorization for Xeloda is required.  PA submitted on CoverMyMeds Key [id number] Status is pending  Oral Oncology Clinic will continue to follow.   Nicholas Lowery. [name], [place] [facility] [phone] 01/18/2017 1:04 PM

## 2017-01-19 MED ORDER — CAPECITABINE 500 MG PO TABS: 1000.0000 mg/m2 | ORAL_TABLET | Freq: Two times a day (BID) | ORAL | 2 refills | Status: DC

## 2017-01-19 NOTE — Telephone Encounter (Signed)
Oral Oncology Patient Advocate Encounter  Prior Authorization for Xeloda has been approved.    PA# [id number] Effective dates: 01/18/17 through 01/18/18  Oral Oncology Clinic will continue to follow.   Nicholas Lowery. [name], [place] [facility] [place] [phone] 01/19/2017 8:28 AM

## 2017-01-20 ENCOUNTER — Ambulatory Visit (HOSPITAL_BASED_OUTPATIENT_CLINIC_OR_DEPARTMENT_OTHER): Payer: BLUE CROSS/BLUE SHIELD

## 2017-01-20 ENCOUNTER — Encounter: Payer: Self-pay | Admitting: Hematology

## 2017-01-20 VITALS — BP 133/79 | HR 77 | Temp 98.3°F | Resp 18

## 2017-01-20 DIAGNOSIS — C189 Malignant neoplasm of colon, unspecified: Secondary | ICD-10-CM

## 2017-01-20 DIAGNOSIS — Z5189 Encounter for other specified aftercare: Secondary | ICD-10-CM

## 2017-01-20 DIAGNOSIS — C182 Malignant neoplasm of ascending colon: Secondary | ICD-10-CM

## 2017-01-20 DIAGNOSIS — C787 Secondary malignant neoplasm of liver and intrahepatic bile duct: Principal | ICD-10-CM

## 2017-01-20 MED ORDER — SODIUM CHLORIDE 0.9% FLUSH
10.0000 mL | INTRAVENOUS | Status: DC | PRN
  Administered 2017-01-20: 10 mL
  Filled 2017-01-20: qty 10

## 2017-01-20 MED ORDER — HEPARIN SOD (PORK) LOCK FLUSH 100 UNIT/ML IV SOLN
500.0000 [IU] | Freq: Once | INTRAVENOUS | Status: AC | PRN
  Administered 2017-01-20: 500 [IU]
  Filled 2017-01-20: qty 5

## 2017-01-20 MED ORDER — PEGFILGRASTIM INJECTION 6 MG/0.6ML ~~LOC~~
6.0000 mg | PREFILLED_SYRINGE | Freq: Once | SUBCUTANEOUS | Status: AC
  Administered 2017-01-20: 6 mg via SUBCUTANEOUS

## 2017-01-22 ENCOUNTER — Encounter: Payer: Self-pay | Admitting: Hematology

## 2017-01-22 MED ORDER — CAPECITABINE 500 MG PO TABS: 1000.0000 mg/m2 | ORAL_TABLET | Freq: Two times a day (BID) | ORAL | 2 refills | Status: DC

## 2017-01-23 ENCOUNTER — Encounter: Payer: Self-pay | Admitting: Hematology

## 2017-01-23 NOTE — Progress Notes (Signed)
Submitted auth request for Ondansetron today.  Status is pending.  °

## 2017-01-24 ENCOUNTER — Encounter: Payer: Self-pay | Admitting: Hematology

## 2017-01-24 ENCOUNTER — Telehealth: Payer: Self-pay | Admitting: Hematology

## 2017-01-24 NOTE — Progress Notes (Signed)
Pt's Ondansetron was approved from 01/23/17 through 01/23/18.

## 2017-01-24 NOTE — Telephone Encounter (Signed)
Scheduled appt per 9/06 los - patient to get new schedule next visit.  

## 2017-01-30 NOTE — Progress Notes (Signed)
[place]  [phone] [phone]  Clinic Follow Up Note  Patient Care Team: Curlene Labrum, MD as PCP - General (Family Medicine) 02/01/2017   CHIEF COMPLAINTS:  Follow up metastatic right colon cancer   Oncology History   Cancer Staging Metastatic colon cancer to liver [facility]) Staging form: Colon and Rectum, AJCC 8th Edition - Clinical stage from 06/01/2016: Stage IVA (cTX, cNX, pM1a) - Signed by Truitt Merle, MD on 07/04/2016 - Pathologic stage from 06/14/2016: Stage IVA (pT4b(m), pN2b, pM1a) - Signed by Truitt Merle, MD on 07/04/2016       Metastatic colon cancer to liver ([place])   04/2015 Procedure    Colonoscopy by Dr. Ladona Horns. It showed showed 2 sessile polyps ready between 3-5 mm in size located 20 cm (A, B) from the point of entry, polypectomy was performed. Pedunculated polyp was found in the ascending colon (C), polypectomy was performed, and additional polyp (D) was found 30 cm from the point of entry, removed      04/2015 Pathology Results    tubular adenoma (A and B), and well differentiated adenocarcinoma arising from tubulovillous adenoma (C) and well differentiated adenocarcinoma arising from severe dysplasia to intramucosal carcinoma within tubular adenoma.       04/2015 Initial Diagnosis    Metastatic colon cancer to liver ([place])      05/29/2016 Imaging    CT abdomen and pelvis with contrast showed an apple core like stricture in right colon just above the cecum, measuring 3.2 cm in lengths. This is highly suspicious for malignancy. Small lymph node a noticed he had adjacent mesentery, measuring 8 mm. There is a low-density lesion within the inferior aspect of the right hepatic lobe measuring 1.7 cm, suspicious for metastasis.       06/06/2016 Tumor Marker    CEA 9.99      06/08/2016 PET scan    IMPRESSION: Approximately 3 cm hypermetabolic mass in the ascending colon, consistent with primary colon carcinoma. This mass results in  colonic obstruction and small bowel dilatation. Additional areas of hypermetabolic wall thickening in the cecum may represent other sites of colon carcinoma or colitis. Mild hypermetabolic lymphadenopathy in right pericolonic region, porta hepatis, and aortocaval space, consistent with metastatic disease. Mild hypermetabolic mediastinal lymphadenopathy also seen, and thoracic lymph node metastases cannot be excluded. Solitary hypermetabolic focus in inferior right hepatic lobe, consistent with liver metastasis. Consider abdomen MRI without and with contrast for further evaluation.      06/14/2016 Surgery    Hand assisted right hemicolectomy and small bowel resection for colon cancer, liver biopsy, by Dr. Barry Dienes      06/14/2016 Pathology Results    Right hemicolectomy showed invasive well to moderately differentiated adenocarcinoma, 2 foci measuring 7.5 cm and 4.5 cm, tumor invades through full thickness of colon, to the seroma and involve the Small Bowel, Surgical Margins Were Negative, 24 Out Of 64 Lymph Nodes Were Positive, Extracapsular Extension Identified, Multiple Satellite Tumor Deposits Present, Liver Biopsy Showed Metastatic Adenocarcinoma.        06/14/2016 Miscellaneous    Tumor MMR normal, MSI stable       06/14/2016 Miscellaneous    Foundation one genomic testing showed K-ras G12 D mutation, APC and TP53 mutation. No BRAF and NRAS mutation. MSI-stable, tumor burden low.      07/06/2016 Tumor Marker    CEA 13.69      07/13/2016 -  Chemotherapy    mFOLFOX, every 2 weeks, started on 07/14/2015, Avastin added  from cycle 3  Oxaliplatin dose to 54m/m2 due to side effects and some cytopenia on 09/21/16  Changed to FOLFIRINOX starting cycle 7 and Reduced  Due to neuropathy hold Oxaliplatin and add Irinotecan with neulasta on day 3 starting with cycle 7 Add low dose Oxaliplatin with cycle 8.         07/27/2016 Tumor Marker    CEA 15.85      09/04/2016 Imaging    Ct C/A/P W  Contrast IMPRESSION: Interval right colectomy. Stable small liver metastasis in the inferior right hepatic lobe. Stable mild porta hepatis and aortocaval lymphadenopathy. Stable mild mediastinal lymphadenopathy. No new or progressive metastatic disease identified within the chest, abdomen, or pelvis.      12/19/2016 PET scan    IMPRESSION: 1. Right hemicolectomy, with resolution of the prior hypermetabolic activity inferiorly in the right hepatic lobe, in several mediastinal lymph nodes, and in lymph nodes in the retroperitoneum and porta hepatis. No residual hypermetabolic or enlarged lymph nodes are identified. 2. Low-grade diffuse skeletal metabolic activity is likely therapy related. 3. Coronary atherosclerosis. 4. 3 by 4 mm right middle lobe pulmonary nodule is stable, not appreciably hypermetabolic, but below sensitive PET-CT size thresholds. This may warrant surveillance.      01/11/2017 Imaging    MR Abdomen W WO Contrast IMPRESSION: 1. No acute findings within the abdomen. Previously noted liver metastasis has resolved in the interval. No new lesions.       HISTORY OF PRESENTING ILLNESS (06/01/2016):  CMarley Charlot89y.o. male is here because of his recently abdominal CT which is highly suspicious for metastatic colon cancer. He is accompanied by his wife to my clinic today. He was referred by his primary care physician Dr. BPleas Koch   He had colonoscopy in 2016 for mild rectal bleeding, he describe small amount fresh blood mixed with stool, he has no other constitutional symptoms at that time. He was referred to gastroenterologist Dr. CLadona Hornsand underwent a colonoscopy in December 2016. The colonoscopy showed 2 sessile polyps ready between 3-5 mm in size located 20 cm (A, B) from the point of entry, polypectomy was performed. Pedunculated polyp was found in the ascending colon (C), polypectomy was performed, and additional polyp (D) was found 30 cm from the point of entry,  removed. The pathology reviewed tubular adenoma (A and B), and well differentiated adenocarcinoma arising from tubulovillous adenoma (C) and well differentiated adenocarcinoma arising from severe dysplasia to intramucosal carcinoma within tubular adenoma. Dr. CTye Marylandtried multiple times to reach patient, but patient sought they were calling him about the bill, and did not return the phone calls. He was not aware the cancer diagnosis until recently.   He started having diarrhea and vomiting in mid Dec 2017, and felt a "pop" in right side abdomen, he was seen at urgent care, and was treated with antiemetics, and IVF, lab tests were OK. Due to his persistent intermittent diarrhea and epigastric pain since then, he was seen by PCP and he eventually had CT abdomen and pelvis scan which showed a upper core lesion in the ascending colon, and I'll 1.7 cm lesion in the liver, highly suspicious for metastasis. [place]  He has lost 30 lbs in the past one month, has low appetite, eats a small meals 1-2 times a day. He has moderate fatigue, able to tolerate routine activities including his work, but feels exhausted at the end of study. He has occasional constipation, denies recent rectal bleeding.  CURRENT THERAPY: Maintenance therapy, Xeloda  '2000mg'$   (1000 mg/m2)  q12h on day 1-14 every 21 days starting in 2 weeks plus AVASTIN, starting 02/01/2017.    INTERIM HISTORY:  Nicholas Lowery returns today for follow up and treatment. He reports that he is doing well overall. Since his last infusion, he still reports some nausea, fatigue and loss of appetite. He also notices some discoloration and pain of his fingernails and palms. He still has numbness of legs. He is concerned about his recent weight loss.  He is also worried about how his treatment will affect his ability to work.  He would like to have cataract surgery later this year.   MEDICAL HISTORY:  Past Medical History:  Diagnosis  Date  . Anxiety   . Cancer of ascending colon ([place])   . Seasonal allergies     SURGICAL HISTORY: Past Surgical History:  Procedure Laterality Date  . APPENDECTOMY  1992  . CATARACT EXTRACTION W/ INTRAOCULAR LENS IMPLANT Left 04/2016  . COLON SURGERY    . COLONOSCOPY W/ BIOPSIES AND POLYPECTOMY  04/2015  . INGUINAL HERNIA REPAIR Right 1982  . KNEE ARTHROSCOPY W/ MENISCECTOMY Right 1998  . LAPAROSCOPIC RIGHT COLECTOMY N/A 06/14/2016   Procedure: LAPAROSCOPIC HAND ASSISTED HEMICOLECTOMY AND SMALL BOWEL RESECTION.;  Surgeon: [name], MD;  Location: [place];  Service: General;  Laterality: N/A;  . LIVER BIOPSY Right 06/14/2016   Procedure: LIVER BIOPSY;  Surgeon: [name], MD;  Location: [place];  Service: General;  Laterality: Right;  Right Inferior Liver  . PORTACATH PLACEMENT N/A 07/06/2016   Procedure: INSERTION PORT-A-CATH;  Surgeon: [name], MD;  Location: [place];  Service: General;  Laterality: N/A;  . VASECTOMY  2005    SOCIAL HISTORY: Social History   Social History  . Marital status: Married    Spouse name: N/A  . Number of children: N/A  . Years of education: N/A   Occupational History  . Not on file.   Social History Main Topics  . Smoking status: Former Smoker    Years: 2.00    Types: Cigarettes    Quit date: 26  . Smokeless tobacco: Never Used  . Alcohol use 2.4 oz/week    4 Cans of beer per week     Comment: social, none since colon surgery  . Drug use: No     Comment: 06/15/2016 "nothing since college"  . Sexual activity: Yes   Other Topics Concern  . Not on file   Social History Narrative  . No narrative on file   He is married. They have 3 boys, 40-12 yo. He works for a [employer], desk job.   FAMILY HISTORY: Family History  Problem Relation Age of Onset  . Cancer Mother        lung cancer  . Stroke Mother   . Hypertension Father   . CAD Father   . Cancer Maternal Grandfather        prostate cancer      ALLERGIES:  is allergic to penicillins.  MEDICATIONS:  Current Outpatient Prescriptions  Medication Sig Dispense Refill  . amLODipine (NORVASC) 10 MG tablet Take 1 tablet (10 mg total) by mouth daily. 30 tablet 2  . capecitabine (XELODA) 500 MG tablet Take 4 tablets (2,000 mg total) by mouth 2 (two) times daily after a meal. Take for 14 days on, 7 days off 112 tablet 2  . celecoxib (CELEBREX) 200 MG capsule Take 200 mg by mouth daily.  1  .  dexamethasone (DECADRON) 4 MG tablet For 5 days after chemo (begin day 2) 10 tablet 0  . diphenoxylate-atropine (LOMOTIL) 2.5-0.025 MG tablet Take 2 tablets by mouth 4 (four) times daily as needed for diarrhea or loose stools. 30 tablet 0  . FLUoxetine (PROZAC) 40 MG capsule Take 40 mg by mouth daily.    Marland Kitchen lidocaine-prilocaine (EMLA) cream Apply to affected area once 30 g 3  . lisinopril (PRINIVIL) 10 MG tablet Take 1 tablet (10 mg total) by mouth daily. 90 tablet 0  . LORazepam (ATIVAN) 0.5 MG tablet Take 1 tablet (0.5 mg total) by mouth every 8 (eight) hours as needed (for nausea). 30 tablet 0  . ondansetron (ZOFRAN ODT) 8 MG disintegrating tablet Take 1 tablet (8 mg total) by mouth every 8 (eight) hours as needed for nausea or vomiting. 30 tablet 2  . prochlorperazine (COMPAZINE) 10 MG tablet Take 1 tablet (10 mg total) by mouth every 6 (six) hours as needed for nausea or vomiting. 30 tablet 2  . traMADol (ULTRAM) 50 MG tablet Take 1-2 tablets (50-100 mg total) by mouth every 6 (six) hours as needed for moderate pain or severe pain. 30 tablet 0  . docusate sodium (COLACE) 100 MG capsule Take 100 mg by mouth as needed.      No current facility-administered medications for this visit.    Facility-Administered Medications Ordered in Other Visits  Medication Dose Route Frequency Provider Last Rate Last Dose  . 0.9 %  sodium chloride infusion   Intravenous Once Malachy Mood, MD      . bevacizumab (AVASTIN) 700 mg in sodium chloride 0.9 % 100 mL chemo  infusion  7.9 mg/kg (Treatment Plan Recorded) Intravenous Q21 days Malachy Mood, MD      . clindamycin (CLEOCIN) 900 mg in dextrose 5 % 50 mL IVPB  900 mg Intravenous 60 min Pre-Op Almond Lint, MD       And  . gentamicin (GARAMYCIN) 450 mg in dextrose 5 % 50 mL IVPB  5 mg/kg Intravenous 60 min Pre-Op Almond Lint, MD      . heparin lock flush 100 unit/mL  500 Units Intracatheter Once PRN Malachy Mood, MD      . Influenza vac split quadrivalent PF (FLUARIX) injection 0.5 mL  0.5 mL Intramuscular Once Malachy Mood, MD      . sodium chloride flush (NS) 0.9 % injection 10 mL  10 mL Intracatheter PRN Malachy Mood, MD         REVIEW OF SYSTEMS:  Constitutional: Denies fevers, chills or abnormal night sweats  (+) fatigue (+)loss of appetite Eyes: Denies blurriness of vision, double vision or watery eyes Ears, nose, mouth, throat, and face: Denies mucositis or sore throat  Respiratory: Denies cough, dyspnea or wheezes Cardiovascular: Denies palpitation, chest discomfort or lower extremity swelling Gastrointestinal:  Denies heartburn or change in bowel habits (+) nausea and  Skin:normal Lymphatics: Denies new lymphadenopathy or easy bruising Neurological: (+) numbness to legs MSK: Muscle weakness in hands Behavioral/Psych: Mood is stable, no new changes  All other systems were reviewed with the patient and are negative.  PHYSICAL EXAMINATION:  ECOG PERFORMANCE STATUS: 1 - Symptomatic but completely ambulatory  Vitals:   02/01/17 0852  BP: 124/87  Pulse: 90  Resp: 18  Temp: 98.2 F (36.8 C)  TempSrc: Oral  SpO2: 99%  Weight: 195 lb 4.8 oz (88.6 kg)  Height: 5\' 11"  (1.803 m)   . GENERAL:alert, no distress and comfortable. SKIN: skin color, texture, turgor are  normal, no rashes or significant lesions EYES: normal, conjunctiva are pink and non-injected, sclera clear OROPHARYNX:no exudate, no erythema and lips, buccal mucosa, and tongue normal  NECK: supple, thyroid normal size, non-tender,  without nodularity LYMPH:  no palpable lymphadenopathy in the cervical, axillary or inguinal LUNGS: clear to auscultation and percussion with normal breathing effort HEART: regular rate & rhythm and no murmurs and no lower extremity edema ABDOMEN:abdomen soft, Surgical scar in the midline around the umbilical has healed well. non-tender and normal bowel sounds Musculoskeletal:no cyanosis of digits and no clubbing  PSYCH: alert & oriented x 3 with fluent speech NEURO: no focal motor/sensory deficits (+) mildly decreased bilateral vibration sensation in feet  LABORATORY DATA:  I have reviewed the data as listed CBC Latest Ref Rng & Units 02/01/2017 01/18/2017 01/04/2017  WBC 4.0 - 10.3 10e3/uL 8.9 7.6 9.8  Hemoglobin 13.0 - 17.1 g/dL 12.4(L) 11.7(L) 12.1(L)  Hematocrit 38.4 - 49.9 % 35.8(L) 34.2(L) 35.7(L)  Platelets 140 - 400 10e3/uL 135(L) 121(L) 103(L)   CMP Latest Ref Rng & Units 02/01/2017 01/18/2017 01/04/2017  Glucose 70 - 140 mg/dl 139 143(H) 90  BUN 7.0 - 26.0 mg/dL 12.9 16.3 18.4  Creatinine 0.7 - 1.3 mg/dL 1.2 1.2 1.2  Sodium 136 - 145 mEq/L 137 139 140  Potassium 3.5 - 5.1 mEq/L 4.4 4.0 4.7  Chloride 101 - 111 mmol/L - - -  CO2 22 - 29 mEq/L _0 Calcium 8.4 - 10.4 mg/dL 9.6 9.1 9.3  Total Protein 6.4 - 8.3 g/dL 7.3 6.7 6.6  Total Bilirubin 0.20 - 1.20 mg/dL 0.53 0.51 0.39  Alkaline Phos 40 - 150 U/L 120 116 110  AST 5 - 34 U/L _1 ALT 0 - 55 U/L _2 PATHOLOGY   Diagnosis 06/14/2016 1. Colon, segmental resection for tumor, Right Ascending Hemicolectomy and Small Bowel - INVASIVE WELL TO MODERATELY DIFFERENTIATED ADENOCARCINOMA. - TWO TUMOR FOCI MEASURING 7.5 CM AND 4.5 CM IN GREATEST DIMENSION. - TUMOR INVADES THROUGH FULL THICKNESS OF COLON, THROUGH THE SEROSA TO INVOLVE THE SMALL BOWEL. - MARGINS ARE NEGATIVE. - TWENTY FOUR OF SIXTY FOUR LYMPH NODES POSITIVE FOR METASTATIC ADENOCARCINOMA (24/64). - EXTRACAPSULAR EXTENSION IDENTIFIED - MULTIPLE SATELLITE  TUMOR DEPOSITS PRESENT. - SEE ONCOLOGY TEMPLATE. 2. Liver, needle/core biopsy, Right Inferior - POSITIVE FOR METASTATIC ADENOCARCINOMA.    RADIOGRAPHIC STUDIES: I have personally reviewed his outside CT scan from 05/29/2016 and agreed with the findings in the report. Mr Abdomen W Wo Contrast  Result Date: 01/11/2017 CLINICAL DATA:  Adenocarcinoma of the colon  with liver metastases. EXAM: MRI ABDOMEN WITHOUT AND WITH CONTRAST TECHNIQUE: Multiplanar multisequence MR imaging of the abdomen was performed both before and after the administration of intravenous contrast. CONTRAST:  27m MULTIHANCE GADOBENATE DIMEGLUMINE 529 MG/ML IV SOLN COMPARISON:  12/19/2016 FINDINGS: Lower chest: No acute findings. Hepatobiliary: No mass or other parenchymal abnormality identified. Previous right lobe of liver metastasis has resolved. The gallbladder is normal. No biliary dilatation. Pancreas: No mass, inflammatory changes, or other parenchymal abnormality identified. Spleen:  The spleen is enlarged measuring 18.6 cm in length. Adrenals/Urinary Tract: Normal adrenal glands. Normal appearance of the kidneys. No mass or hydronephrosis. Stomach/Bowel: Visualized portions within the abdomen are unremarkable. Vascular/Lymphatic: No pathologically enlarged lymph nodes identified. No abdominal aortic aneurysm demonstrated. Other:  None. Musculoskeletal: No suspicious bone lesions identified. IMPRESSION: 1. No acute findings within the abdomen. Previously noted liver metastasis has resolved in the interval. No new lesions. Electronically Signed  By: Kerby Moors M.D.   On: 01/11/2017 09:25   MR Abdomen W WO Contrast 01/11/2017 IMPRESSION: 1. No acute findings within the abdomen. Previously noted liver metastasis has resolved in the interval. No new lesions.  CT CAP 09/04/16 IMPRESSION: Interval right colectomy. Stable small liver metastasis in the inferior right hepatic lobe. Stable mild porta hepatis and aortocaval  lymphadenopathy. Stable mild mediastinal lymphadenopathy. No new or progressive metastatic disease identified within the chest, abdomen, or pelvis.  ASSESSMENT & PLAN:  56 y.o. Caucasian male, without significant past medical history, presented with rectal bleeding in December 2016, colonoscopy showed 4 polyps, 2 of them showed invasive adenocarcinoma, unfortunately he was not aware of the pathology findings and was not treated. He now presented with fatigue, weight loss, anorexia and abdominal pain  1. Right colon cancer with liver and node metastasis, pT4bN2bM1a, stage IV, MSI-stable, KRAS mutation (+) -I previously reviewed her PET scan findings, which showed solitary liver metastasis, abdominal and possible thoracic node metastasis  -His liver biopsy confirmed metastasis -Due to the bowel obstruction, he underwent upfront hemicolectomy. I previously reviewed his surgical pathology findings, which showed 2 primary right colon cancer, very locally advanced with 24 lymph nodes positive, surgical margins were negative. -I previously discussed his Foundation one genomic testing results, which showed care arrest mutation, and as high stable, low tumor burden. So he would not benefit from EGFR inhibitor, or immunotherapy alone. -The was started on palliative first-line chemotherapy FOLFOX. Avastin was added from cycle 3 -He has solitary liver metastasis, but multiple mild hypermetabolic adenopathy in mediastinum and abdomen. We previously discussed the possible local therapy options, such as liver metastectomy or ablation if he has great response to chemo and his node metastasis resolves  -restaging CT from 09/04/2016 reviewed with pt, stable disease. Will continue chemo -he developed side effects from FOLFOX, and a worsening thrombocytopenia, despite reduced dose of oxaliplatin  -Due to his overall limited response to FOLFOX, and his wish to pursue surgery, I previously recommend him to change chemo  to FOLFIRINOX with low dose  oxaliplatin, hopefully he will response better.  -Due to his severe nausea and vomiting, anorexia, mild diarrhea, I'll reduce his irinotecan dose from cycle 10  - PET scan from 12/19/2016 reviewed in person. He had near complete metabolic response from chemotherapy, post his liver metastasis and hypermetabolic lymph nodes are negative on PET scan with decreased size.  -Due to his worsening neuropathy, and good response to chemotherapy, I previously stopped oxaliplatin from cycle 11, and continue FOLFIRI and avastin   - I previously presented his case to the GI Tumor Board and discuss liver targeted therapy vs resection. Given lymph node metastasis, I favored liver ablation by interventional radiology.  -Reviewed his abdominal MRI findings, which showed no visible lesions in the liver. He has had a complete radiographic response -He was seen by interventional radiologist Dr. Earleen Newport, who does not think he need liver ablation since the solid liver met has resolved after chemo. I agree with his plan.  -He was recently seen by surgeon Dr. Barry Dienes, who recommend maintenance chemotherapy for now, no indication for resection at this point given -Given his excellent response to chemotherapy, and the poor tolerance to intravenous chemotherapy, I recommend him to change treatments to maintenance therapy -He is going to start Xeloda and Avastin maintenance therapy today - will continue monitoring closely with scans every 3 months - follow up every 3 weeks.  - patient would like to have cateract surgery later  this year. I advised him to get plan from his eye doctor. He should have surgery on his off week or I could give him an extra week off   2. Hypertension -His blood pressure has been high lately, I have previously given him clonidine 0.1 mg in the infusion room before Avastin -We previously discussed Avastin can cause hypertension -I have started him on amlodipine, and increase  dose to 10 mg daily -He does not want to follow-up with his primary care physician. -Previously, his blood pressure was noted to be elevated, especially diastolic. I previously added on lisinopril 10 mg daily for him -Patient blood pressure noted to be 128/79 today 01/18/2017. -Continue Norvasc and Lisinopril for now while on Avastin -Will continue to monitor -continue Amlodipine 91m dialy, BP better controlled   3. Anemia of iron deficiency and chemo  -He has mild anemia, likely related to his colon cancer bleeding. -I previously recommend him to take oral iron supplement over-the-counter, potential side effects of constipation and gastric or discomfort or discussed with him. He stopped taking this before surgery. -I previously advised him to begin taking oral iron supplements again. - Iron levels previously on 08/24/16 at 46. -Iron levels previously on 01/04/17 at 92. - We will continue to monitor iron levels  -Hgb stable today, 12.4  4. Anorexia and weight loss -Secondary to underlying malignancy -I previously encouraged him to try nutritional supplement, such as boost or initial and try to eat more -The patient has lost some weight recently.   5. Peripheral neuropathy, G2 -Secondary to oxaliplatin, dose reduced and ultimately discontinued -Slightly worse with FOLFIRI, held Irinotecan beginning 01/04/17 -Unchanged today, discussed with patient this may take some time to resolve.  - I strongly encouraged him to take vitamin B-complex supplements -Will continue to monitor.  6. Goal of care discussion  -We previously discussed the incurable nature of his cancer, and the overall poor prognosis, especially if he does not have good response to chemotherapy or progress on chemo -The patient understands the goal of care is palliative.  -He is full code now     Plan  - labs reviewed, adequate to continue with Avastin today, he was started Xeloda today -labs, flush, f/u and chemo AVASTIN in  3 and 6 weeks   Orders Placed This Encounter  Procedures  . Urine protein by dipstick    Standing Status:   Standing    Number of Occurrences:   50    Standing Expiration Date:   02/01/2018    All questions were answered.   The patient knows to call the clinic with any problems, questions or concerns.  I spent 25 minutes counseling the patient face to face. The total time spent in the appointment was 30 minutes and more than 50% was on counseling.  This document serves as a record of services personally performed by YTruitt Merle MD. It was created on her behalf by TBrandt Loosen a trained medical scribe. The creation of this record is based on the scribe's personal observations and the provider's statements to them. This document has been checked and approved by the attending provider.   FTruitt Merle 02/01/2017

## 2017-02-01 ENCOUNTER — Ambulatory Visit (HOSPITAL_BASED_OUTPATIENT_CLINIC_OR_DEPARTMENT_OTHER): Payer: BLUE CROSS/BLUE SHIELD

## 2017-02-01 ENCOUNTER — Ambulatory Visit: Payer: BLUE CROSS/BLUE SHIELD

## 2017-02-01 ENCOUNTER — Encounter: Payer: Self-pay | Admitting: Hematology

## 2017-02-01 ENCOUNTER — Other Ambulatory Visit (HOSPITAL_BASED_OUTPATIENT_CLINIC_OR_DEPARTMENT_OTHER): Payer: BLUE CROSS/BLUE SHIELD

## 2017-02-01 ENCOUNTER — Ambulatory Visit (HOSPITAL_BASED_OUTPATIENT_CLINIC_OR_DEPARTMENT_OTHER): Payer: BLUE CROSS/BLUE SHIELD | Admitting: Hematology

## 2017-02-01 VITALS — BP 124/87 | HR 90 | Temp 98.2°F | Resp 18 | Ht 71.0 in | Wt 195.3 lb

## 2017-02-01 VITALS — BP 130/86

## 2017-02-01 DIAGNOSIS — C182 Malignant neoplasm of ascending colon: Secondary | ICD-10-CM

## 2017-02-01 DIAGNOSIS — C189 Malignant neoplasm of colon, unspecified: Secondary | ICD-10-CM

## 2017-02-01 DIAGNOSIS — Z5112 Encounter for antineoplastic immunotherapy: Secondary | ICD-10-CM

## 2017-02-01 DIAGNOSIS — C787 Secondary malignant neoplasm of liver and intrahepatic bile duct: Secondary | ICD-10-CM

## 2017-02-01 DIAGNOSIS — Z23 Encounter for immunization: Secondary | ICD-10-CM

## 2017-02-01 DIAGNOSIS — D500 Iron deficiency anemia secondary to blood loss (chronic): Secondary | ICD-10-CM

## 2017-02-01 DIAGNOSIS — I10 Essential (primary) hypertension: Secondary | ICD-10-CM | POA: Diagnosis not present

## 2017-02-01 DIAGNOSIS — D6481 Anemia due to antineoplastic chemotherapy: Secondary | ICD-10-CM

## 2017-02-01 DIAGNOSIS — G620 Drug-induced polyneuropathy: Secondary | ICD-10-CM

## 2017-02-01 DIAGNOSIS — Z95828 Presence of other vascular implants and grafts: Secondary | ICD-10-CM

## 2017-02-01 LAB — CBC WITH DIFFERENTIAL/PLATELET
BASO%: 0.5 % (ref 0.0–2.0)
BASOS ABS: 0 10*3/uL (ref 0.0–0.1)
EOS ABS: 0.2 10*3/uL (ref 0.0–0.5)
EOS%: 2.1 % (ref 0.0–7.0)
HEMATOCRIT: 35.8 % — AB (ref 38.4–49.9)
HEMOGLOBIN: 12.4 g/dL — AB (ref 13.0–17.1)
LYMPH#: 0.7 10*3/uL — AB (ref 0.9–3.3)
LYMPH%: 7.7 % — ABNORMAL LOW (ref 14.0–49.0)
MCH: 33.5 pg — AB (ref 27.2–33.4)
MCHC: 34.5 g/dL (ref 32.0–36.0)
MCV: 97.1 fL (ref 79.3–98.0)
MONO#: 0.8 10*3/uL (ref 0.1–0.9)
MONO%: 8.7 % (ref 0.0–14.0)
NEUT#: 7.2 10*3/uL — ABNORMAL HIGH (ref 1.5–6.5)
NEUT%: 81 % — ABNORMAL HIGH (ref 39.0–75.0)
Platelets: 135 10*3/uL — ABNORMAL LOW (ref 140–400)
RBC: 3.69 10*6/uL — ABNORMAL LOW (ref 4.20–5.82)
RDW: 16.2 % — AB (ref 11.0–14.6)
WBC: 8.9 10*3/uL (ref 4.0–10.3)

## 2017-02-01 LAB — COMPREHENSIVE METABOLIC PANEL
ALBUMIN: 3.6 g/dL (ref 3.5–5.0)
ALK PHOS: 120 U/L (ref 40–150)
ALT: 12 U/L (ref 0–55)
AST: 20 U/L (ref 5–34)
Anion Gap: 9 mEq/L (ref 3–11)
BILIRUBIN TOTAL: 0.53 mg/dL (ref 0.20–1.20)
BUN: 12.9 mg/dL (ref 7.0–26.0)
CALCIUM: 9.6 mg/dL (ref 8.4–10.4)
CO2: 25 mEq/L (ref 22–29)
Chloride: 103 mEq/L (ref 98–109)
Creatinine: 1.2 mg/dL (ref 0.7–1.3)
EGFR: 71 mL/min/{1.73_m2} — AB (ref >90)
Glucose: 139 mg/dl (ref 70–140)
POTASSIUM: 4.4 meq/L (ref 3.5–5.1)
Sodium: 137 mEq/L (ref 136–145)
TOTAL PROTEIN: 7.3 g/dL (ref 6.4–8.3)

## 2017-02-01 LAB — IRON AND TIBC
%SAT: 20 % (ref 20–55)
Iron: 66 ug/dL (ref 42–163)
TIBC: 321 ug/dL (ref 202–409)
UIBC: 256 ug/dL (ref 117–376)

## 2017-02-01 LAB — CEA (IN HOUSE-CHCC): CEA (CHCC-In House): 6.66 ng/mL — ABNORMAL HIGH (ref 0.00–5.00)

## 2017-02-01 LAB — FERRITIN: FERRITIN: 288 ng/mL (ref 22–316)

## 2017-02-01 MED ORDER — SODIUM CHLORIDE 0.9% FLUSH
10.0000 mL | Freq: Once | INTRAVENOUS | Status: AC
  Administered 2017-02-01: 10 mL
  Filled 2017-02-01: qty 10

## 2017-02-01 MED ORDER — SODIUM CHLORIDE 0.9 % IV SOLN
Freq: Once | INTRAVENOUS | Status: AC
  Administered 2017-02-01: 10:00:00 via INTRAVENOUS

## 2017-02-01 MED ORDER — INFLUENZA VAC SPLIT QUAD 0.5 ML IM SUSY
0.5000 mL | PREFILLED_SYRINGE | Freq: Once | INTRAMUSCULAR | Status: AC
  Administered 2017-02-01: 0.5 mL via INTRAMUSCULAR
  Filled 2017-02-01: qty 0.5

## 2017-02-01 MED ORDER — HEPARIN SOD (PORK) LOCK FLUSH 100 UNIT/ML IV SOLN
500.0000 [IU] | Freq: Once | INTRAVENOUS | Status: AC | PRN
  Administered 2017-02-01: 500 [IU]
  Filled 2017-02-01: qty 5

## 2017-02-01 MED ORDER — SODIUM CHLORIDE 0.9 % IV SOLN
7.9000 mg/kg | INTRAVENOUS | Status: DC
  Administered 2017-02-01: 700 mg via INTRAVENOUS
  Filled 2017-02-01: qty 16

## 2017-02-01 MED ORDER — SODIUM CHLORIDE 0.9% FLUSH
10.0000 mL | INTRAVENOUS | Status: DC | PRN
  Administered 2017-02-01: 10 mL
  Filled 2017-02-01: qty 10

## 2017-02-01 NOTE — Patient Instructions (Signed)
[facility] Discharge Instructions for Patients Receiving Chemotherapy  Today you received the following chemotherapy agents Avastin   To help prevent nausea and vomiting after your treatment, we encourage you to take your nausea medication as directed.    If you develop nausea and vomiting that is not controlled by your nausea medication, call the clinic.   BELOW ARE SYMPTOMS THAT SHOULD BE REPORTED IMMEDIATELY:  *FEVER GREATER THAN 100.5 F  *CHILLS WITH OR WITHOUT FEVER  NAUSEA AND VOMITING THAT IS NOT CONTROLLED WITH YOUR NAUSEA MEDICATION  *UNUSUAL SHORTNESS OF BREATH  *UNUSUAL BRUISING OR BLEEDING  TENDERNESS IN MOUTH AND THROAT WITH OR WITHOUT PRESENCE OF ULCERS  *URINARY PROBLEMS  *BOWEL PROBLEMS  UNUSUAL RASH Items with * indicate a potential emergency and should be followed up as soon as possible.  Feel free to call the clinic you have any questions or concerns. The clinic phone number is [phone].  Please show the CHEMO ALERT CARD at check-in to the Emergency Department and triage nurse.   

## 2017-02-12 ENCOUNTER — Encounter: Payer: Self-pay | Admitting: Hematology

## 2017-02-12 ENCOUNTER — Telehealth: Payer: Self-pay | Admitting: Hematology

## 2017-02-12 NOTE — Telephone Encounter (Signed)
Called patient regarding October schedule

## 2017-02-13 NOTE — Telephone Encounter (Signed)
Pt called today regarding message he sent yesterday.  He is having lots of issues.  He states he can hardly walk, fingertips are numb, he is losing wt & vomiting every day & gums are bleeding with brushing his teeth.  Today is day 11 of xeloda.  He denies any redness or peeling of feet & hands although some peeling of fingertips.  With discussion he states that he mostly has dry heaves in am & nausea @ twice daily.  No major vomiting.  Appetite is poor & taste is way down & he is losing wt.   Discussed with Dr Burr Medico & informed pt to stop xeloda & to f/u with Dr Burr Medico 02/22/17.  He has appts in computer for 10/4 but Dr Burr Medico agreed to cancel these.  Informed OK to try Vit B 6 for neuropathy & discuss with DR Burr Medico at next visit.  He reports being discouraged & has considered stopping.  He doesn't like how he is feeling.  Discussed anti-nausea meds & suggested that he take ativan or compazine at night before bed & try zofran ODT before he gets up in am.  Suggested high protein, high calorie foods & if unable to eat to try Boost/Ensure/Carnation instant milk/ice cream.  Encouraged increase oral fluid intake.   Reminded to call back if no better or worse.  Pt expressed understanding. I will cancel 02/15/17 appts.

## 2017-02-15 ENCOUNTER — Ambulatory Visit: Payer: BLUE CROSS/BLUE SHIELD | Admitting: Hematology

## 2017-02-15 ENCOUNTER — Other Ambulatory Visit: Payer: BLUE CROSS/BLUE SHIELD

## 2017-02-15 ENCOUNTER — Ambulatory Visit: Payer: BLUE CROSS/BLUE SHIELD

## 2017-02-21 NOTE — Progress Notes (Signed)
[facility]  [phone] [phone]  Clinic Follow Up Note  Patient Care Team: Juliette Alcide, MD as PCP - General (Family Medicine) 02/22/2017   CHIEF COMPLAINTS:  Follow up metastatic right colon cancer   Oncology History   Cancer Staging Metastatic colon cancer to liver [facility]) Staging form: Colon and Rectum, AJCC 8th Edition - Clinical stage from 06/01/2016: Stage IVA (cTX, cNX, pM1a) - Signed by Malachy Mood, MD on 07/04/2016 - Pathologic stage from 06/14/2016: Stage IVA (pT4b(m), pN2b, pM1a) - Signed by Malachy Mood, MD on 07/04/2016       Metastatic colon cancer to liver (HCC)   04/2015 Procedure    Colonoscopy by Dr. Marcha Solders. It showed showed 2 sessile polyps ready between 3-5 mm in size located 20 cm (A, B) from the point of entry, polypectomy was performed. Pedunculated polyp was found in the ascending colon (C), polypectomy was performed, and additional polyp (D) was found 30 cm from the point of entry, removed      04/2015 Pathology Results    tubular adenoma (A and B), and well differentiated adenocarcinoma arising from tubulovillous adenoma (C) and well differentiated adenocarcinoma arising from severe dysplasia to intramucosal carcinoma within tubular adenoma.       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 This is highly suspicious for malignancy. Small lymph node a noticed he had adjacent mesentery, measuring 8 mm. There is a low-density lesion within the inferior aspect of the right hepatic lobe measuring 1.7 cm, suspicious for metastasis.       06/06/2016 Tumor Marker    CEA 9.99      06/08/2016 PET scan    IMPRESSION: Approximately 3 cm hypermetabolic mass in the ascending colon, consistent with primary colon carcinoma. This mass results in  colonic obstruction and small bowel dilatation. Additional areas of hypermetabolic wall thickening in the cecum may represent other sites of colon carcinoma or colitis. Mild hypermetabolic lymphadenopathy in right pericolonic region, porta hepatis, and aortocaval space, consistent with metastatic disease. Mild hypermetabolic mediastinal lymphadenopathy also seen, and thoracic lymph node metastases cannot be excluded. Solitary hypermetabolic focus in inferior right hepatic lobe, consistent with liver metastasis. Consider abdomen MRI without and with contrast for further evaluation.      06/14/2016 Surgery    Hand assisted right hemicolectomy and small bowel resection for colon cancer, liver biopsy, by Dr. Donell Beers      06/14/2016 Pathology Results    Right hemicolectomy showed invasive well to moderately differentiated adenocarcinoma, 2 foci measuring 7.5 cm and 4.5 cm, tumor invades through full thickness of colon, to the seroma and involve the Small Bowel, Surgical Margins Were Negative, 24 Out Of 64 Lymph Nodes Were Positive, Extracapsular Extension Identified, Multiple Satellite Tumor Deposits Present, Liver Biopsy Showed Metastatic Adenocarcinoma.        06/14/2016 Miscellaneous    Tumor MMR normal, MSI stable       06/14/2016 Miscellaneous    Foundation one genomic testing showed K-ras G12 D mutation, APC and TP53 mutation. No BRAF and NRAS mutation. MSI-stable, tumor burden low.      07/06/2016 Tumor Marker    CEA 13.69      07/13/2016 - 01/18/2017 Chemotherapy    mFOLFOX, every 2 weeks, started on 07/14/2015, Avastin added  from cycle 3  Oxaliplatin dose to '60mg'$ /m2 due to side effects and some cytopenia on 09/21/16  Changed to FOLFIRINOX starting cycle 7 and Reduced  Due to neuropathy hold Oxaliplatin and add Irinotecan with neulasta on day 3 starting with cycle 7 Add low dose Oxaliplatin with cycle 8.  Due to his worsening neuropathy, and good response to chemotherapy, I previously  stopped oxaliplatin from cycle 11, and continue FOLFIRI and avastin         07/27/2016 Tumor Marker    CEA 15.85      09/04/2016 Imaging    Ct C/A/P W Contrast IMPRESSION: Interval right colectomy. Stable small liver metastasis in the inferior right hepatic lobe. Stable mild porta hepatis and aortocaval lymphadenopathy. Stable mild mediastinal lymphadenopathy. No new or progressive metastatic disease identified within the chest, abdomen, or pelvis.      12/19/2016 PET scan    IMPRESSION: 1. Right hemicolectomy, with resolution of the prior hypermetabolic activity inferiorly in the right hepatic lobe, in several mediastinal lymph nodes, and in lymph nodes in the retroperitoneum and porta hepatis. No residual hypermetabolic or enlarged lymph nodes are identified. 2. Low-grade diffuse skeletal metabolic activity is likely therapy related. 3. Coronary atherosclerosis. 4. 3 by 4 mm right middle lobe pulmonary nodule is stable, not appreciably hypermetabolic, but below sensitive PET-CT size thresholds. This may warrant surveillance.      01/11/2017 Imaging    MR Abdomen W WO Contrast IMPRESSION: 1. No acute findings within the abdomen. Previously noted liver metastasis has resolved in the interval. No new lesions.      02/01/2017 -  Chemotherapy    Maintenance therapy, Xeloda '2000mg'$  (1000 mg/m2)  q12h on day 1-14 every 21 days plus AVASTIN, starting 02/01/2017.  stopped after 12 days due to poor tolerance on 02/14/17  Changed to maintenance 5-FU and avastin every 2 weeks starting on 02/22/17         HISTORY OF PRESENTING ILLNESS (06/01/2016):  Nicholas Lowery 56 y.o. male is here because of his recently abdominal CT which is highly suspicious for metastatic colon cancer. He is accompanied by his wife to my clinic today. He was referred by his primary care physician Dr. Pleas Koch.   He had colonoscopy in 2016 for mild rectal bleeding, he describe small amount fresh blood mixed with  stool, he has no other constitutional symptoms at that time. He was referred to gastroenterologist Dr. Ladona Horns and underwent a colonoscopy in December 2016. The colonoscopy showed 2 sessile polyps ready between 3-5 mm in size located 20 cm (A, B) from the point of entry, polypectomy was performed. Pedunculated polyp was found in the ascending colon (C), polypectomy was performed, and additional polyp (D) was found 30 cm from the point of entry, removed. The pathology reviewed tubular adenoma (A and B), and well differentiated adenocarcinoma arising from tubulovillous adenoma (C) and well differentiated adenocarcinoma arising from severe dysplasia to intramucosal carcinoma within tubular adenoma. Dr. Tye Maryland tried multiple times to reach patient, but patient sought they were calling him about the bill, and did not return the phone calls. He was not aware the cancer diagnosis until recently.   He started having diarrhea and vomiting in mid Dec 2017, and felt a "pop" in right side abdomen, he was seen at urgent care, and was treated with antiemetics, and IVF, lab tests were OK. Due to his persistent intermittent diarrhea and epigastric pain since then, he was seen by PCP and he eventually had CT abdomen and pelvis scan which  showed a upper core lesion in the ascending colon, and I'll 1.7 cm lesion in the liver, highly suspicious for metastasis. He was referred to [place] for further evaluation.  He has lost 30 lbs in the past one month, has low appetite, eats a small meals 1-2 times a day. He has moderate fatigue, able to tolerate routine activities including his work, but feels exhausted at the end of study. He has occasional constipation, denies recent rectal bleeding.  CURRENT THERAPY: Maintenance 5-Fu and Avastin started 01/04/2017, he tried one cycle Xeloda to replace 5-fu, but could not tolerate  INTERIM HISTORY:  Nicholas Lowery returns today for follow up and treatment. He presents to the clinic today accompanied by his  wife.  He notes on Xeloda it took 5 days after stopping for him to get the medication out of his body. While on it he could not walk and he had increase in gums bleeding. He loss dexterity in his hands and he had rash on his hands and not sure if on his feet. He had a aggressive cough, usually dry. He had nausea/vomiting. He had sores on his tongue and deep into his mouth. He had drop in appetite and loss weight. He had sever depression according to his wife. He took it for 12 days with 8 pills a day. He started noticing symptoms early after starting it.  Since being off his weight has been regained, he still has depression and he has residual tingling in hands and his mouth sores are healing. He has soft nails and wants to get them stronger. He has taste change residually  He much rather return to 5-Fu pump. His cough is sounding better since stopping Xeloda. He only feels that the pump effects him most mentally.    MEDICAL HISTORY:  Past Medical History:  Diagnosis Date  . Anxiety   . Cancer of ascending colon ([place])   . Seasonal allergies     SURGICAL HISTORY: Past Surgical History:  Procedure Laterality Date  . APPENDECTOMY  1992  . CATARACT EXTRACTION W/ INTRAOCULAR LENS IMPLANT Left 04/2016  . COLON SURGERY    . COLONOSCOPY W/ BIOPSIES AND POLYPECTOMY  04/2015  . INGUINAL HERNIA REPAIR Right 1982  . KNEE ARTHROSCOPY W/ MENISCECTOMY Right 1998  . LAPAROSCOPIC RIGHT COLECTOMY N/A 06/14/2016   Procedure: LAPAROSCOPIC HAND ASSISTED HEMICOLECTOMY AND SMALL BOWEL RESECTION.;  Surgeon: [name], MD;  Location: [place];  Service: General;  Laterality: N/A;  . LIVER BIOPSY Right 06/14/2016   Procedure: LIVER BIOPSY;  Surgeon: [name], MD;  Location: [place];  Service: General;  Laterality: Right;  Right Inferior Liver  . PORTACATH PLACEMENT N/A 07/06/2016   Procedure: INSERTION PORT-A-CATH;  Surgeon: [name], MD;  Location: [place];  Service: General;  Laterality:  N/A;  . VASECTOMY  2005    SOCIAL HISTORY: Social History   Social History  . Marital status: Married    Spouse name: N/A  . Number of children: N/A  . Years of education: N/A   Occupational History  . Not on file.   Social History Main Topics  . Smoking status: Former Smoker    Years: 2.00    Types: Cigarettes    Quit date: 35  . Smokeless tobacco: Never Used  . Alcohol use 2.4 oz/week    4 Cans of beer per week     Comment: social, none since colon surgery  . Drug use: No     Comment: 06/15/2016 "nothing since college"  .  Sexual activity: Yes   Other Topics Concern  . Not on file   Social History Narrative  . No narrative on file   He is married. They have 3 boys, 62-12 yo. He works for a [employer], desk job.   FAMILY HISTORY: Family History  Problem Relation Age of Onset  . Cancer Mother        lung cancer  . Stroke Mother   . Hypertension Father   . CAD Father   . Cancer Maternal Grandfather        prostate cancer     ALLERGIES:  is allergic to penicillins.  MEDICATIONS:  Current Outpatient Prescriptions  Medication Sig Dispense Refill  . amLODipine (NORVASC) 10 MG tablet Take 1 tablet (10 mg total) by mouth daily. 30 tablet 2  . celecoxib (CELEBREX) 200 MG capsule Take 200 mg by mouth daily.  1  . dexamethasone (DECADRON) 4 MG tablet For 5 days after chemo (begin day 2) 10 tablet 0  . diphenoxylate-atropine (LOMOTIL) 2.5-0.025 MG tablet Take 2 tablets by mouth 4 (four) times daily as needed for diarrhea or loose stools. 30 tablet 0  . docusate sodium (COLACE) 100 MG capsule Take 100 mg by mouth as needed.     Marland Kitchen FLUoxetine (PROZAC) 40 MG capsule Take 40 mg by mouth daily.    Marland Kitchen lidocaine-prilocaine (EMLA) cream Apply to affected area once 30 g 3  . lisinopril (PRINIVIL) 10 MG tablet Take 1 tablet (10 mg total) by mouth daily. 90 tablet 1  . LORazepam (ATIVAN) 0.5 MG tablet Take 1 tablet (0.5 mg total) by mouth every 8 (eight) hours as needed  (for nausea). 30 tablet 0  . ondansetron (ZOFRAN ODT) 8 MG disintegrating tablet Take 1 tablet (8 mg total) by mouth every 8 (eight) hours as needed for nausea or vomiting. 30 tablet 2  . traMADol (ULTRAM) 50 MG tablet Take 1-2 tablets (50-100 mg total) by mouth every 6 (six) hours as needed for moderate pain or severe pain. 30 tablet 0  . magic mouthwash w/lidocaine SOLN Take 5 mLs by mouth 4 (four) times daily as needed for mouth pain. Swish and spit or swish and swallow for sore mouth 240 mL 1  . prochlorperazine (COMPAZINE) 10 MG tablet Take 1 tablet (10 mg total) by mouth every 6 (six) hours as needed for nausea or vomiting. (Patient not taking: Reported on 02/22/2017) 30 tablet 2   No current facility-administered medications for this visit.    Facility-Administered Medications Ordered in Other Visits  Medication Dose Route Frequency Provider Last Rate Last Dose  . clindamycin (CLEOCIN) 900 mg in dextrose 5 % 50 mL IVPB  900 mg Intravenous 60 min Pre-Op [name], MD       And  . gentamicin (GARAMYCIN) 450 mg in dextrose 5 % 50 mL IVPB  5 mg/kg Intravenous 60 min Pre-Op [name], MD      . fluorouracil (ADRUCIL) 5,000 mg in sodium chloride 0.9 % 150 mL chemo infusion  2,375 mg/m2 (Treatment Plan Recorded) Intravenous 1 day or 1 dose Truitt Merle, MD   5,000 mg at 02/22/17 1438  . heparin lock flush 100 unit/mL  500 Units Intracatheter Once PRN Truitt Merle, MD      . sodium chloride flush (NS) 0.9 % injection 10 mL  10 mL Intracatheter PRN Truitt Merle, MD         REVIEW OF SYSTEMS:  Constitutional: Denies fevers, chills or abnormal night sweats  (+) weight regained (+)  loss of appetite (+) taste change Eyes: Denies blurriness of vision, double vision or watery eyes Ears, nose, mouth, throat, and face: (+) mouth sores, healing  Respiratory: (+) productive cough  Cardiovascular: Denies palpitation, chest discomfort or lower extremity swelling Gastrointestinal:  Denies heartburn or change  in bowel habits Skin:normal Lymphatics: Denies new lymphadenopathy or easy bruising Neurological: (+) residual numbness/tingling to hands MSK: Muscle weakness in hands Behavioral/Psych: Mood is stable, no new changes (+) depression All other systems were reviewed with the patient and are negative.  PHYSICAL EXAMINATION:  ECOG PERFORMANCE STATUS: 1 - Symptomatic but completely ambulatory  Vitals:   02/22/17 1013  BP: 114/85  Pulse: 66  Resp: 17  Temp: 99.1 F (37.3 C)  TempSrc: Oral  SpO2: 100%  Weight: 194 lb 11.2 oz (88.3 kg)  Height: _0  (1.803 m)   . GENERAL:alert, no distress and comfortable. SKIN: skin color, texture, turgor are normal, no rashes or significant lesions EYES: normal, conjunctiva are pink and non-injected, sclera clear OROPHARYNX:no exudate, no erythema and lips, buccal mucosa, and tongue normal   (+) mouth sores, healing well  NECK: supple, thyroid normal size, non-tender, without nodularity LYMPH:  no palpable lymphadenopathy in the cervical, axillary or inguinal LUNGS: clear to auscultation and percussion with normal breathing effort HEART: regular rate & rhythm and no murmurs and no lower extremity edema ABDOMEN:abdomen soft, Surgical scar in the midline around the umbilical has healed well. non-tender and normal bowel sounds Musculoskeletal:no cyanosis of digits and no clubbing  PSYCH: alert & oriented x 3 with fluent speech NEURO: no focal motor/sensory deficits (+) mildly decreased bilateral vibration sensation in feet  LABORATORY DATA:  I have reviewed the data as listed CBC Latest Ref Rng & Units 02/22/2017 02/01/2017 01/18/2017  WBC 4.0 - 10.3 10e3/uL 3.4(L) 8.9 7.6  Hemoglobin 13.0 - 17.1 g/dL 11.9(L) 12.4(L) 11.7(L)  Hematocrit 38.4 - 49.9 % 35.5(L) 35.8(L) 34.2(L)  Platelets 140 - 400 10e3/uL 175 135(L) 121(L)   CMP Latest Ref Rng & Units 02/22/2017 02/01/2017 01/18/2017  Glucose 70 - 140 mg/dl 113 139 143(H)  BUN 7.0 - 26.0 mg/dL 12.6 12.9  16.3  Creatinine 0.7 - 1.3 mg/dL 1.1 1.2 1.2  Sodium 136 - 145 mEq/L 140 137 139  Potassium 3.5 - 5.1 mEq/L 4.5 4.4 4.0  Chloride 101 - 111 mmol/L - - -  CO2 22 - 29 mEq/L _1 Calcium 8.4 - 10.4 mg/dL 9.2 9.6 9.1  Total Protein 6.4 - 8.3 g/dL 6.9 7.3 6.7  Total Bilirubin 0.20 - 1.20 mg/dL 0.48 0.53 0.51  Alkaline Phos 40 - 150 U/L 57 120 116  AST 5 - 34 U/L _2 ALT 0 - 55 U/L _3 PATHOLOGY   Diagnosis 06/14/2016 1. Colon, segmental resection for tumor, Right Ascending Hemicolectomy and Small Bowel - INVASIVE WELL TO MODERATELY DIFFERENTIATED ADENOCARCINOMA. - TWO TUMOR FOCI MEASURING 7.5 CM AND 4.5 CM IN GREATEST DIMENSION. - TUMOR INVADES THROUGH FULL THICKNESS OF COLON, THROUGH THE SEROSA TO INVOLVE THE SMALL BOWEL. - MARGINS ARE NEGATIVE. - TWENTY FOUR OF SIXTY FOUR LYMPH NODES POSITIVE FOR METASTATIC ADENOCARCINOMA (24/64). - EXTRACAPSULAR EXTENSION IDENTIFIED - MULTIPLE SATELLITE TUMOR DEPOSITS PRESENT. - SEE ONCOLOGY TEMPLATE. 2. Liver, needle/core biopsy, Right Inferior - POSITIVE FOR METASTATIC ADENOCARCINOMA.    RADIOGRAPHIC STUDIES: I have personally reviewed his outside CT scan from 05/29/2016 and agreed with the findings in the report. No results found. MR Abdomen W WO Contrast 01/11/2017 IMPRESSION:  1. No acute findings within the abdomen. Previously noted liver metastasis has resolved in the interval. No new lesions.  CT CAP 09/04/16 IMPRESSION: Interval right colectomy. Stable small liver metastasis in the inferior right hepatic lobe. Stable mild porta hepatis and aortocaval lymphadenopathy. Stable mild mediastinal lymphadenopathy. No new or progressive metastatic disease identified within the chest, abdomen, or pelvis.  ASSESSMENT & PLAN:  56 y.o. Caucasian male, without significant past medical history, presented with rectal bleeding in December 2016, colonoscopy showed 4 polyps, 2 of them showed invasive adenocarcinoma, unfortunately  he was not aware of the pathology findings and was not treated. He now presented with fatigue, weight loss, anorexia and abdominal pain  1. Right colon cancer with liver and node metastasis, pT4bN2bM1a, stage IV, MSI-stable, KRAS mutation (+) -I previously reviewed her PET scan findings, which showed solitary liver metastasis, abdominal and possible thoracic node metastasis  -His liver biopsy confirmed metastasis -Due to the bowel obstruction, he underwent upfront hemicolectomy. I previously reviewed his surgical pathology findings, which showed 2 primary right colon cancer, very locally advanced with 24 lymph nodes positive, surgical margins were negative. -I previously discussed his Foundation one genomic testing results, which showed care arrest mutation, and as high stable, low tumor burden. So he would not benefit from EGFR inhibitor, or immunotherapy alone. -The was started on palliative first-line chemotherapy FOLFOX. Avastin was added from cycle 3 -He has solitary liver metastasis, but multiple mild hypermetabolic adenopathy in mediastinum and abdomen. We previously discussed the possible local therapy options, such as liver metastectomy or ablation if he has great response to chemo and his node metastasis resolves  -restaging CT from 09/04/2016 reviewed with pt, stable disease. Will continue chemo -he developed side effects from FOLFOX, and a worsening thrombocytopenia, despite reduced dose of oxaliplatin  -Due to his overall limited response to FOLFOX, and his wish to pursue surgery, I previously recommend him to change chemo to FOLFIRINOX with low dose  oxaliplatin, hopefully he will response better.  -Due to his severe nausea and vomiting, anorexia, mild diarrhea, I'll reduce his irinotecan dose from cycle 10  - PET scan from 12/19/2016 reviewed in person. He had near complete metabolic response from chemotherapy, post his liver metastasis and hypermetabolic lymph nodes are negative on PET scan  with decreased size.  -Due to his worsening neuropathy, and good response to chemotherapy, I previously stopped oxaliplatin from cycle 11, and continue FOLFIRI and avastin   - I previously presented his case to the GI Tumor Board and discuss liver targeted therapy vs resection. Given lymph node metastasis, I favored liver ablation by interventional radiology.  -Reviewed his abdominal MRI findings, which showed no visible lesions in the liver. He has had a complete radiographic response -He was seen by interventional radiologist Dr. Earleen Newport, who does not think he need liver ablation since the solid liver met has resolved after chemo. I agree with his plan.  -He was recently seen by surgeon Dr. Barry Dienes, who recommend maintenance chemotherapy for now, no indication for resection at this point given -Given his excellent response to chemotherapy, and the poor tolerance to intravenous chemotherapy, I recommend him to change treatments to maintenance therapy -He started Xeloda and Avastin maintenance therapy 02/01/17, due to very poor tolerance he stopped Xeloda 02/14/17 -I suggest he returns to 5-Fu with avastin for maintenance therapy every 2 weeks since he tolerated this much better. I recommend he continue for at least 3 more months. I also discussed the option of weekly 5-Fu bolus  with leucovorin infusion. He agreed to proceed with 5-Fu pump and avastin today (02/22/17). -Repeat scan in 03/2017. -I suggest over-the-counter biotin and Vitamin B for helpful supplements  -I encouraged him to contact us if he experiences any sever or unexpected side effects.  - follow up in 3 weeks. Pt requests to have longer break after today's treatment due to his previous side effects from Xeloda     2. Hypertension -His blood pressure has been high lately, I have previously given him clonidine 0.1 mg in the infusion room before Avastin -We previously discussed Avastin can cause hypertension -I have started him on  amlodipine, and increase dose to 10 mg daily -He does not want to follow-up with his primary care physician. -Previously, his blood pressure was noted to be elevated, especially diastolic. I previously added on lisinopril 10 mg daily for him -Patient blood pressure noted to be 128/79 today 01/18/2017. -Continue Norvasc and Lisinopril for now while on Avastin -Will continue to monitor -continue Amlodipine '10mg'$  dialy, BP is better controlled -refilled his Lisinopril today (02/22/17), I advised him to find a new PCP.   3. Anemia of iron deficiency and chemo  -He has mild anemia, likely related to his colon cancer bleeding. -I previously recommend him to take oral iron supplement over-the-counter, potential side effects of constipation and gastric or discomfort or discussed with him. He stopped taking this before surgery. -I previously advised him to begin taking oral iron supplements again. - Iron levels previously on 08/24/16 at 46. -Iron levels previously on 01/04/17 at 92. - We will continue to monitor iron levels  -Hgb stable on 02/01/17, 12.4, dropped slightly to 11.9 on 02/22/17  4. Anorexia and weight loss -Secondary to underlying malignancy -I previously encouraged him to try nutritional supplement, such as boost or initial and try to eat more -The patient has lost some weight recently -weight is stable.   5. Peripheral neuropathy, G2 -Secondary to oxaliplatin, dose reduced and ultimately discontinued -Slightly worse with FOLFIRI, held Irinotecan beginning 01/04/17 -Unchanged, discussed with patient this may take some time to resolve.  - I strongly encouraged him to take vitamin B-complex supplements -Will continue to monitor.  6. Goal of care discussion  -We previously discussed the incurable nature of his cancer, and the overall poor prognosis, especially if he does not have good response to chemotherapy or progress on chemo -The patient understands the goal of care is palliative.    -He is full code now    7. Productive cough -Exacerbated by Xeloda and much improved since stopping. -His last CT scan did not show anything concerning in his lungs. We will check with next scan  8. Mouth sores, secondary to Xeloda -improved since stopping Xeloda -I suggest salt water wash to help while healing.   Plan  -Refill Lisinopril today   -Lab, flush, f/u and 5-fu pump and avastin in 3 and 5 weeks  -CT CAP W contrast around 11/13     No orders of the defined types were placed in this encounter.   All questions were answered.   The patient knows to call the clinic with any problems, questions or concerns.  I spent 25 minutes counseling the patient face to face. The total time spent in the appointment was 30 minutes and more than 50% was on counseling.  This document serves as a record of services personally performed by Truitt Merle, MD. It was created on her behalf by Joslyn Devon, a trained medical scribe. The creation  of this record is based on the scribe's personal observations and the provider's statements to them. This document has been checked and approved by the attending provider.    Truitt Merle  02/22/2017

## 2017-02-22 ENCOUNTER — Encounter: Payer: Self-pay | Admitting: Hematology

## 2017-02-22 ENCOUNTER — Ambulatory Visit (HOSPITAL_BASED_OUTPATIENT_CLINIC_OR_DEPARTMENT_OTHER): Payer: BLUE CROSS/BLUE SHIELD | Admitting: Hematology

## 2017-02-22 ENCOUNTER — Ambulatory Visit (HOSPITAL_BASED_OUTPATIENT_CLINIC_OR_DEPARTMENT_OTHER): Payer: BLUE CROSS/BLUE SHIELD

## 2017-02-22 ENCOUNTER — Other Ambulatory Visit: Payer: Self-pay | Admitting: *Deleted

## 2017-02-22 ENCOUNTER — Telehealth: Payer: Self-pay | Admitting: *Deleted

## 2017-02-22 ENCOUNTER — Telehealth: Payer: Self-pay | Admitting: Hematology

## 2017-02-22 ENCOUNTER — Other Ambulatory Visit (HOSPITAL_BASED_OUTPATIENT_CLINIC_OR_DEPARTMENT_OTHER): Payer: BLUE CROSS/BLUE SHIELD

## 2017-02-22 VITALS — BP 123/88 | HR 75 | Temp 98.6°F | Resp 20

## 2017-02-22 VITALS — BP 114/85 | HR 66 | Temp 99.1°F | Resp 17 | Ht 71.0 in | Wt 194.7 lb

## 2017-02-22 DIAGNOSIS — C182 Malignant neoplasm of ascending colon: Secondary | ICD-10-CM

## 2017-02-22 DIAGNOSIS — D6481 Anemia due to antineoplastic chemotherapy: Secondary | ICD-10-CM

## 2017-02-22 DIAGNOSIS — C189 Malignant neoplasm of colon, unspecified: Secondary | ICD-10-CM

## 2017-02-22 DIAGNOSIS — I10 Essential (primary) hypertension: Secondary | ICD-10-CM

## 2017-02-22 DIAGNOSIS — R05 Cough: Secondary | ICD-10-CM

## 2017-02-22 DIAGNOSIS — Z95828 Presence of other vascular implants and grafts: Secondary | ICD-10-CM

## 2017-02-22 DIAGNOSIS — Z5111 Encounter for antineoplastic chemotherapy: Secondary | ICD-10-CM | POA: Diagnosis not present

## 2017-02-22 DIAGNOSIS — C787 Secondary malignant neoplasm of liver and intrahepatic bile duct: Secondary | ICD-10-CM

## 2017-02-22 DIAGNOSIS — G620 Drug-induced polyneuropathy: Secondary | ICD-10-CM | POA: Diagnosis not present

## 2017-02-22 DIAGNOSIS — Z5112 Encounter for antineoplastic immunotherapy: Secondary | ICD-10-CM

## 2017-02-22 DIAGNOSIS — D500 Iron deficiency anemia secondary to blood loss (chronic): Secondary | ICD-10-CM

## 2017-02-22 DIAGNOSIS — Z452 Encounter for adjustment and management of vascular access device: Secondary | ICD-10-CM

## 2017-02-22 LAB — CBC WITH DIFFERENTIAL/PLATELET
BASO%: 1 % (ref 0.0–2.0)
Basophils Absolute: 0 10*3/uL (ref 0.0–0.1)
EOS%: 1.6 % (ref 0.0–7.0)
Eosinophils Absolute: 0.1 10*3/uL (ref 0.0–0.5)
HCT: 35.5 % — ABNORMAL LOW (ref 38.4–49.9)
HEMOGLOBIN: 11.9 g/dL — AB (ref 13.0–17.1)
LYMPH%: 15.5 % (ref 14.0–49.0)
MCH: 33.3 pg (ref 27.2–33.4)
MCHC: 33.7 g/dL (ref 32.0–36.0)
MCV: 99 fL — AB (ref 79.3–98.0)
MONO#: 0.4 10*3/uL (ref 0.1–0.9)
MONO%: 12.4 % (ref 0.0–14.0)
NEUT%: 69.5 % (ref 39.0–75.0)
NEUTROS ABS: 2.4 10*3/uL (ref 1.5–6.5)
Platelets: 175 10*3/uL (ref 140–400)
RBC: 3.58 10*6/uL — AB (ref 4.20–5.82)
RDW: 17.2 % — ABNORMAL HIGH (ref 11.0–14.6)
WBC: 3.4 10*3/uL — ABNORMAL LOW (ref 4.0–10.3)
lymph#: 0.5 10*3/uL — ABNORMAL LOW (ref 0.9–3.3)

## 2017-02-22 LAB — COMPREHENSIVE METABOLIC PANEL
ALBUMIN: 3.8 g/dL (ref 3.5–5.0)
ALK PHOS: 57 U/L (ref 40–150)
ALT: 12 U/L (ref 0–55)
AST: 16 U/L (ref 5–34)
Anion Gap: 8 mEq/L (ref 3–11)
BILIRUBIN TOTAL: 0.48 mg/dL (ref 0.20–1.20)
BUN: 12.6 mg/dL (ref 7.0–26.0)
CO2: 25 meq/L (ref 22–29)
CREATININE: 1.1 mg/dL (ref 0.7–1.3)
Calcium: 9.2 mg/dL (ref 8.4–10.4)
Chloride: 106 mEq/L (ref 98–109)
GLUCOSE: 113 mg/dL (ref 70–140)
Potassium: 4.5 mEq/L (ref 3.5–5.1)
SODIUM: 140 meq/L (ref 136–145)
TOTAL PROTEIN: 6.9 g/dL (ref 6.4–8.3)

## 2017-02-22 LAB — UA PROTEIN, DIPSTICK - CHCC: Protein, ur: NEGATIVE mg/dL

## 2017-02-22 MED ORDER — LISINOPRIL 10 MG PO TABS: 10.0000 mg | ORAL_TABLET | Freq: Every day | ORAL | 1 refills | Status: DC

## 2017-02-22 MED ORDER — SODIUM CHLORIDE 0.9 % IV SOLN
700.0000 mg | Freq: Once | INTRAVENOUS | Status: AC
  Administered 2017-02-22: 700 mg via INTRAVENOUS
  Filled 2017-02-22: qty 16

## 2017-02-22 MED ORDER — ONDANSETRON HCL 8 MG PO TABS
ORAL_TABLET | ORAL | Status: AC
  Filled 2017-02-22: qty 1

## 2017-02-22 MED ORDER — MAGIC MOUTHWASH W/LIDOCAINE: 5.0000 mL | Freq: Four times a day (QID) | ORAL | 1 refills | Status: DC | PRN

## 2017-02-22 MED ORDER — DEXTROSE 5 % IV SOLN
Freq: Once | INTRAVENOUS | Status: AC
  Administered 2017-02-22: 12:00:00 via INTRAVENOUS

## 2017-02-22 MED ORDER — HEPARIN SOD (PORK) LOCK FLUSH 100 UNIT/ML IV SOLN
500.0000 [IU] | Freq: Once | INTRAVENOUS | Status: DC | PRN
Start: 2017-02-22 — End: 2018-03-21
  Filled 2017-02-22: qty 5

## 2017-02-22 MED ORDER — SODIUM CHLORIDE 0.9 % IV SOLN
2375.0000 mg/m2 | INTRAVENOUS | Status: AC
  Administered 2017-02-22: 5000 mg via INTRAVENOUS
  Filled 2017-02-22: qty 100

## 2017-02-22 MED ORDER — SODIUM CHLORIDE 0.9% FLUSH
10.0000 mL | INTRAVENOUS | Status: DC | PRN
  Filled 2017-02-22: qty 10

## 2017-02-22 MED ORDER — SODIUM CHLORIDE 0.9% FLUSH
10.0000 mL | Freq: Once | INTRAVENOUS | Status: AC
  Administered 2017-02-22: 10 mL
  Filled 2017-02-22: qty 10

## 2017-02-22 MED ORDER — ONDANSETRON HCL 8 MG PO TABS
8.0000 mg | ORAL_TABLET | Freq: Once | ORAL | Status: AC
  Administered 2017-02-22: 8 mg via ORAL

## 2017-02-22 NOTE — Telephone Encounter (Signed)
[facility] called in to [facility], [place], [place] for pt to use for relief of mouth sores, per advise of [name].

## 2017-02-22 NOTE — Patient Instructions (Addendum)
Bevacizumab injection What is this medicine? BEVACIZUMAB (be va SIZ yoo mab) is a monoclonal antibody. It is used to treat many types of cancer. This medicine may be used for other purposes; ask your health care provider or pharmacist if you have questions. COMMON BRAND NAME(S): Avastin What should I tell my health care provider before I take this medicine? They need to know if you have any of these conditions: -diabetes -heart disease -high blood pressure -history of coughing up blood -prior anthracycline chemotherapy (e.g., doxorubicin, daunorubicin, epirubicin) -recent or ongoing radiation therapy -recent or planning to have surgery -stroke -an unusual or allergic reaction to bevacizumab, hamster proteins, mouse proteins, other medicines, foods, dyes, or preservatives -pregnant or trying to get pregnant -breast-feeding How should I use this medicine? This medicine is for infusion into a vein. It is given by a health care professional in a hospital or clinic setting. Talk to your pediatrician regarding the use of this medicine in children. Special care may be needed. Overdosage: If you think you have taken too much of this medicine contact a poison control center or emergency room at once. NOTE: This medicine is only for you. Do not share this medicine with others. What if I miss a dose? It is important not to miss your dose. Call your doctor or health care professional if you are unable to keep an appointment. What may interact with this medicine? Interactions are not expected. This list may not describe all possible interactions. Give your health care provider a list of all the medicines, herbs, non-prescription drugs, or dietary supplements you use. Also tell them if you smoke, drink alcohol, or use illegal drugs. Some items may interact with your medicine. What should I watch for while using this medicine? Your condition will be monitored carefully while you are receiving this  medicine. You will need important blood work and urine testing done while you are taking this medicine. This medicine may increase your risk to bruise or bleed. Call your doctor or health care professional if you notice any unusual bleeding. This medicine should be started at least 28 days following major surgery and the site of the surgery should be totally healed. Check with your doctor before scheduling dental work or surgery while you are receiving this treatment. Talk to your doctor if you have recently had surgery or if you have a wound that has not healed. Do not become pregnant while taking this medicine or for 6 months after stopping it. Women should inform their doctor if they wish to become pregnant or think they might be pregnant. There is a potential for serious side effects to an unborn child. Talk to your health care professional or pharmacist for more information. Do not breast-feed an infant while taking this medicine and for 6 months after the last dose. This medicine has caused ovarian failure in some women. This medicine may interfere with the ability to have a child. You should talk to your doctor or health care professional if you are concerned about your fertility. What side effects may I notice from receiving this medicine? Side effects that you should report to your doctor or health care professional as soon as possible: -allergic reactions like skin rash, itching or hives, swelling of the face, lips, or tongue -chest pain or chest tightness -chills -coughing up blood -high fever -seizures -severe constipation -signs and symptoms of bleeding such as bloody or black, tarry stools; red or dark-brown urine; spitting up blood or brown material that looks   like coffee grounds; red spots on the skin; unusual bruising or bleeding from the eye, gums, or nose -signs and symptoms of a blood clot such as breathing problems; chest pain; severe, sudden headache; pain, swelling, warmth in  the leg -signs and symptoms of a stroke like changes in vision; confusion; trouble speaking or understanding; severe headaches; sudden numbness or weakness of the face, arm or leg; trouble walking; dizziness; loss of balance or coordination -stomach pain -sweating -swelling of legs or ankles -vomiting -weight gain Side effects that usually do not require medical attention (report to your doctor or health care professional if they continue or are bothersome): -back pain -changes in taste -decreased appetite -dry skin -nausea -tiredness This list may not describe all possible side effects. Call your doctor for medical advice about side effects. You may report side effects to FDA at 1-800-FDA-1088. Where should I keep my medicine? This drug is given in a hospital or clinic and will not be stored at home. NOTE: This sheet is a summary. It may not cover all possible information. If you have questions about this medicine, talk to your doctor, pharmacist, or health care provider.  2018 Elsevier/Gold Standard (2016-04-28 14:33:29) Fluorouracil, 5-FU injection What is this medicine? FLUOROURACIL, 5-FU (flure oh YOOR a sil) is a chemotherapy drug. It slows the growth of cancer cells. This medicine is used to treat many types of cancer like breast cancer, colon or rectal cancer, pancreatic cancer, and stomach cancer. This medicine may be used for other purposes; ask your health care provider or pharmacist if you have questions. COMMON BRAND NAME(S): Adrucil What should I tell my health care provider before I take this medicine? They need to know if you have any of these conditions: -blood disorders -dihydropyrimidine dehydrogenase (DPD) deficiency -infection (especially a virus infection such as chickenpox, cold sores, or herpes) -kidney disease -liver disease -malnourished, poor nutrition -recent or ongoing radiation therapy -an unusual or allergic reaction to fluorouracil, other  chemotherapy, other medicines, foods, dyes, or preservatives -pregnant or trying to get pregnant -breast-feeding How should I use this medicine? This drug is given as an infusion or injection into a vein. It is administered in a hospital or clinic by a specially trained health care professional. Talk to your pediatrician regarding the use of this medicine in children. Special care may be needed. Overdosage: If you think you have taken too much of this medicine contact a poison control center or emergency room at once. NOTE: This medicine is only for you. Do not share this medicine with others. What if I miss a dose? It is important not to miss your dose. Call your doctor or health care professional if you are unable to keep an appointment. What may interact with this medicine? -allopurinol -cimetidine -dapsone -digoxin -hydroxyurea -leucovorin -levamisole -medicines for seizures like ethotoin, fosphenytoin, phenytoin -medicines to increase blood counts like filgrastim, pegfilgrastim, sargramostim -medicines that treat or prevent blood clots like warfarin, enoxaparin, and dalteparin -methotrexate -metronidazole -pyrimethamine -some other chemotherapy drugs like busulfan, cisplatin, estramustine, vinblastine -trimethoprim -trimetrexate -vaccines Talk to your doctor or health care professional before taking any of these medicines: -acetaminophen -aspirin -ibuprofen -ketoprofen -naproxen This list may not describe all possible interactions. Give your health care provider a list of all the medicines, herbs, non-prescription drugs, or dietary supplements you use. Also tell them if you smoke, drink alcohol, or use illegal drugs. Some items may interact with your medicine. What should I watch for while using this medicine? Visit your doctor  for checks on your progress. This drug may make you feel generally unwell. This is not uncommon, as chemotherapy can affect healthy cells as well as  cancer cells. Report any side effects. Continue your course of treatment even though you feel ill unless your doctor tells you to stop. In some cases, you may be given additional medicines to help with side effects. Follow all directions for their use. Call your doctor or health care professional for advice if you get a fever, chills or sore throat, or other symptoms of a cold or flu. Do not treat yourself. This drug decreases your body's ability to fight infections. Try to avoid being around people who are sick. This medicine may increase your risk to bruise or bleed. Call your doctor or health care professional if you notice any unusual bleeding. Be careful brushing and flossing your teeth or using a toothpick because you may get an infection or bleed more easily. If you have any dental work done, tell your dentist you are receiving this medicine. Avoid taking products that contain aspirin, acetaminophen, ibuprofen, naproxen, or ketoprofen unless instructed by your doctor. These medicines may hide a fever. Do not become pregnant while taking this medicine. Women should inform their doctor if they wish to become pregnant or think they might be pregnant. There is a potential for serious side effects to an unborn child. Talk to your health care professional or pharmacist for more information. Do not breast-feed an infant while taking this medicine. Men should inform their doctor if they wish to father a child. This medicine may lower sperm counts. Do not treat diarrhea with over the counter products. Contact your doctor if you have diarrhea that lasts more than 2 days or if it is severe and watery. This medicine can make you more sensitive to the sun. Keep out of the sun. If you cannot avoid being in the sun, wear protective clothing and use sunscreen. Do not use sun lamps or tanning beds/booths. What side effects may I notice from receiving this medicine? Side effects that you should report to your doctor  or health care professional as soon as possible: -allergic reactions like skin rash, itching or hives, swelling of the face, lips, or tongue -low blood counts - this medicine may decrease the number of white blood cells, red blood cells and platelets. You may be at increased risk for infections and bleeding. -signs of infection - fever or chills, cough, sore throat, pain or difficulty passing urine -signs of decreased platelets or bleeding - bruising, pinpoint red spots on the skin, black, tarry stools, blood in the urine -signs of decreased red blood cells - unusually weak or tired, fainting spells, lightheadedness -breathing problems -changes in vision -chest pain -mouth sores -nausea and vomiting -pain, swelling, redness at site where injected -pain, tingling, numbness in the hands or feet -redness, swelling, or sores on hands or feet -stomach pain -unusual bleeding Side effects that usually do not require medical attention (report to your doctor or health care professional if they continue or are bothersome): -changes in finger or toe nails -diarrhea -dry or itchy skin -hair loss -headache -loss of appetite -sensitivity of eyes to the light -stomach upset -unusually teary eyes This list may not describe all possible side effects. Call your doctor for medical advice about side effects. You may report side effects to FDA at 1-800-FDA-1088. Where should I keep my medicine? This drug is given in a hospital or clinic and will not be stored at  home. NOTE: This sheet is a summary. It may not cover all possible information. If you have questions about this medicine, talk to your doctor, pharmacist, or health care provider.  2018 Elsevier/Gold Standard (2007-09-04 13:53:16)

## 2017-02-22 NOTE — Telephone Encounter (Signed)
Scheduled appt per 10/11 los - Gave patient AVS and calender per los.  

## 2017-02-24 ENCOUNTER — Ambulatory Visit (HOSPITAL_BASED_OUTPATIENT_CLINIC_OR_DEPARTMENT_OTHER): Payer: BLUE CROSS/BLUE SHIELD

## 2017-02-24 ENCOUNTER — Encounter (HOSPITAL_COMMUNITY): Payer: Self-pay

## 2017-02-24 ENCOUNTER — Emergency Department (HOSPITAL_COMMUNITY)
Admission: EM | Admit: 2017-02-24 | Discharge: 2017-02-24 | Disposition: A | Discharge status: Home / Self Care | Payer: BLUE CROSS/BLUE SHIELD | Attending: Emergency Medicine | Admitting: Emergency Medicine

## 2017-02-24 ENCOUNTER — Emergency Department (HOSPITAL_COMMUNITY): Payer: BLUE CROSS/BLUE SHIELD

## 2017-02-24 VITALS — BP 139/92 | HR 84 | Temp 98.0°F | Resp 20

## 2017-02-24 DIAGNOSIS — R112 Nausea with vomiting, unspecified: Secondary | ICD-10-CM | POA: Diagnosis not present

## 2017-02-24 DIAGNOSIS — R197 Diarrhea, unspecified: Secondary | ICD-10-CM | POA: Insufficient documentation

## 2017-02-24 DIAGNOSIS — R05 Cough: Secondary | ICD-10-CM | POA: Insufficient documentation

## 2017-02-24 DIAGNOSIS — Z9221 Personal history of antineoplastic chemotherapy: Secondary | ICD-10-CM | POA: Insufficient documentation

## 2017-02-24 DIAGNOSIS — Z880 Allergy status to penicillin: Secondary | ICD-10-CM | POA: Insufficient documentation

## 2017-02-24 DIAGNOSIS — C787 Secondary malignant neoplasm of liver and intrahepatic bile duct: Principal | ICD-10-CM

## 2017-02-24 DIAGNOSIS — C189 Malignant neoplasm of colon, unspecified: Secondary | ICD-10-CM

## 2017-02-24 DIAGNOSIS — Z5189 Encounter for other specified aftercare: Secondary | ICD-10-CM | POA: Diagnosis not present

## 2017-02-24 DIAGNOSIS — R062 Wheezing: Secondary | ICD-10-CM | POA: Diagnosis not present

## 2017-02-24 DIAGNOSIS — Z87891 Personal history of nicotine dependence: Secondary | ICD-10-CM | POA: Insufficient documentation

## 2017-02-24 DIAGNOSIS — K920 Hematemesis: Secondary | ICD-10-CM | POA: Insufficient documentation

## 2017-02-24 DIAGNOSIS — Z79899 Other long term (current) drug therapy: Secondary | ICD-10-CM | POA: Insufficient documentation

## 2017-02-24 DIAGNOSIS — I10 Essential (primary) hypertension: Secondary | ICD-10-CM | POA: Insufficient documentation

## 2017-02-24 DIAGNOSIS — C182 Malignant neoplasm of ascending colon: Secondary | ICD-10-CM | POA: Diagnosis present

## 2017-02-24 LAB — CBC WITH DIFFERENTIAL/PLATELET
BASOS ABS: 0 10*3/uL (ref 0.0–0.1)
Basophils Relative: 0 %
Eosinophils Absolute: 0 10*3/uL (ref 0.0–0.7)
Eosinophils Relative: 1 %
HEMATOCRIT: 38.5 % — AB (ref 39.0–52.0)
Hemoglobin: 12.8 g/dL — ABNORMAL LOW (ref 13.0–17.0)
LYMPHS PCT: 11 %
Lymphs Abs: 0.7 10*3/uL (ref 0.7–4.0)
MCH: 33 pg (ref 26.0–34.0)
MCHC: 33.2 g/dL (ref 30.0–36.0)
MCV: 99.2 fL (ref 78.0–100.0)
MONO ABS: 0.7 10*3/uL (ref 0.1–1.0)
MONOS PCT: 11 %
NEUTROS ABS: 4.8 10*3/uL (ref 1.7–7.7)
Neutrophils Relative %: 77 %
Platelets: 166 10*3/uL (ref 150–400)
RBC: 3.88 MIL/uL — ABNORMAL LOW (ref 4.22–5.81)
RDW: 17.6 % — AB (ref 11.5–15.5)
WBC: 6.3 10*3/uL (ref 4.0–10.5)

## 2017-02-24 LAB — PROTIME-INR
INR: 1.12
Prothrombin Time: 14.4 seconds (ref 11.4–15.2)

## 2017-02-24 LAB — COMPREHENSIVE METABOLIC PANEL
ALK PHOS: 57 U/L (ref 38–126)
ALT: 18 U/L (ref 17–63)
AST: 25 U/L (ref 15–41)
Albumin: 4.1 g/dL (ref 3.5–5.0)
Anion gap: 10 (ref 5–15)
BILIRUBIN TOTAL: 1.1 mg/dL (ref 0.3–1.2)
BUN: 18 mg/dL (ref 6–20)
CALCIUM: 8.9 mg/dL (ref 8.9–10.3)
CO2: 25 mmol/L (ref 22–32)
Chloride: 102 mmol/L (ref 101–111)
Creatinine, Ser: 1.26 mg/dL — ABNORMAL HIGH (ref 0.61–1.24)
GFR calc Af Amer: >60 mL/min (ref >60)
Glucose, Bld: 87 mg/dL (ref 65–99)
POTASSIUM: 4 mmol/L (ref 3.5–5.1)
Sodium: 137 mmol/L (ref 135–145)
TOTAL PROTEIN: 7.1 g/dL (ref 6.5–8.1)

## 2017-02-24 LAB — LIPASE, BLOOD: LIPASE: 28 U/L (ref 11–51)

## 2017-02-24 LAB — FIBRINOGEN: FIBRINOGEN: 361 mg/dL (ref 210–475)

## 2017-02-24 LAB — POC OCCULT BLOOD, ED: Fecal Occult Bld: NEGATIVE

## 2017-02-24 MED ORDER — SODIUM CHLORIDE 0.9 % IV BOLUS (SEPSIS)
1000.0000 mL | Freq: Once | INTRAVENOUS | Status: AC
  Administered 2017-02-24: 1000 mL via INTRAVENOUS

## 2017-02-24 MED ORDER — PEGFILGRASTIM INJECTION 6 MG/0.6ML ~~LOC~~
6.0000 mg | PREFILLED_SYRINGE | Freq: Once | SUBCUTANEOUS | Status: AC
  Administered 2017-02-24: 6 mg via SUBCUTANEOUS

## 2017-02-24 MED ORDER — PANTOPRAZOLE SODIUM 40 MG IV SOLR
40.0000 mg | Freq: Once | INTRAVENOUS | Status: AC
  Administered 2017-02-24: 40 mg via INTRAVENOUS
  Filled 2017-02-24: qty 40

## 2017-02-24 MED ORDER — PANTOPRAZOLE SODIUM 20 MG PO TBEC: 20.0000 mg | DELAYED_RELEASE_TABLET | Freq: Every day | ORAL | 0 refills | Status: DC

## 2017-02-24 MED ORDER — HEPARIN SOD (PORK) LOCK FLUSH 100 UNIT/ML IV SOLN
500.0000 [IU] | Freq: Once | INTRAVENOUS | Status: AC | PRN
  Administered 2017-02-24: 500 [IU]
  Filled 2017-02-24: qty 5

## 2017-02-24 MED ORDER — MAGIC MOUTHWASH W/LIDOCAINE: 5.0000 mL | Freq: Three times a day (TID) | ORAL | 0 refills | Status: DC | PRN

## 2017-02-24 MED ORDER — HEPARIN SOD (PORK) LOCK FLUSH 100 UNIT/ML IV SOLN
500.0000 [IU] | Freq: Once | INTRAVENOUS | Status: AC
  Administered 2017-02-24: 500 [IU]
  Filled 2017-02-24: qty 5

## 2017-02-24 MED ORDER — SODIUM CHLORIDE 0.9% FLUSH
10.0000 mL | INTRAVENOUS | Status: DC | PRN
  Administered 2017-02-24: 10 mL
  Filled 2017-02-24: qty 10

## 2017-02-24 NOTE — Discharge Instructions (Signed)
Your blood work was normal. Your chest x-ray was also normal.   Dr. Cristina Gong recommends outpatient gastroenterology follow up for possible endoscopy. Please call his office on Monday to make an appointment.   Return to ED for chest pain, continued heavy bloody vomit, abdominal pain, back pain, light-headedness, shortness of breath

## 2017-02-24 NOTE — ED Triage Notes (Signed)
He reports he is currently undergoing chemo. For liver cancer. He states he had a chemo. Treatment today. He reports few episodes of emesis, which contained a quantity of blood. He also states he has a sore on his mouth.

## 2017-02-24 NOTE — ED Notes (Signed)
Patient transported to X-ray 

## 2017-02-24 NOTE — ED Provider Notes (Signed)
[facility] DEPT Provider Note   CSN: [id number] Arrival date & time: 02/24/17  1310     History   Chief Complaint Chief Complaint  Patient presents with  . Cancer  . Bleeding/Bruising    HPI Nicholas Lowery is a 56 y.o. 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 Initially vomited streaks of blood but last emesis was "almost entirely blood". No fevers, chills, CP, SOB, dizziness, abdominal pain, hematuria, melena, hematochezia.  Denies h/o PUD, GI bleeds, NSAID use. No anticoagulants. Also reports small sore in right lower gumline. 3-4 beers nightly.   Of note, pt had radiographic response to chemotherapy of liver mets per MRI on 12/2016. Recently stopped Xeloda (02/14/17) due to side effects and started 5-FU pump and avastatin (10/11-10/13). Chronic chemo side effects for him include nausea, vomiting, dry cough, diarrhea. Has chronic diarrhea, unchanged. Has chronic dry cough, has been improving since chemo agent change recently.   HPI  Past Medical History:  Diagnosis Date  . Anxiety   . Cancer of ascending colon ([place])   . Seasonal allergies     Patient Active Problem List   Diagnosis Date Noted  . HTN (hypertension) 09/06/2016  . Port catheter in place 08/10/2016  . Iron deficiency anemia due to chronic blood loss 07/05/2016  . Goals of care, counseling/discussion 07/05/2016  . Diarrhea 06/06/2016  . Elevated blood pressure reading 06/06/2016  . Metastatic colon cancer to liver ([place]) 06/01/2016    Past Surgical History:  Procedure Laterality Date  . APPENDECTOMY  1992  . CATARACT EXTRACTION W/ INTRAOCULAR LENS IMPLANT Left 04/2016  . COLON SURGERY    . COLONOSCOPY W/ BIOPSIES AND POLYPECTOMY  04/2015  . INGUINAL HERNIA REPAIR Right 1982  . KNEE ARTHROSCOPY W/ MENISCECTOMY Right 1998  . LAPAROSCOPIC  RIGHT COLECTOMY N/A 06/14/2016   Procedure: LAPAROSCOPIC HAND ASSISTED HEMICOLECTOMY AND SMALL BOWEL RESECTION.;  Surgeon: [name], MD;  Location: [facility];  Service: General;  Laterality: N/A;  . LIVER BIOPSY Right 06/14/2016   Procedure: LIVER BIOPSY;  Surgeon: [name], MD;  Location: [facility];  Service: General;  Laterality: Right;  Right Inferior Liver  . PORTACATH PLACEMENT N/A 07/06/2016   Procedure: INSERTION PORT-A-CATH;  Surgeon: [name], MD;  Location: [facility];  Service: General;  Laterality: N/A;  . VASECTOMY  2005       Home Medications    Prior to Admission medications   Medication Sig Start Date End Date Taking? Authorizing Provider  amLODipine (NORVASC) 10 MG tablet Take 1 tablet (10 mg total) by mouth daily. 01/18/17  Yes Truitt Merle, MD  celecoxib (CELEBREX) 200 MG capsule Take 200 mg by mouth daily. 05/21/16  Yes [provider]  FLUoxetine (PROZAC) 40 MG capsule Take 40 mg by mouth daily.   Yes [provider]  lidocaine-prilocaine (EMLA) cream Apply to affected area once 07/05/16  Yes Truitt Merle, MD  lisinopril (PRINIVIL) 10 MG tablet Take 1 tablet (10 mg total) by mouth daily. 02/22/17  Yes Truitt Merle, MD  Multiple Vitamin (MULTIVITAMIN WITH MINERALS) TABS tablet Take 1 tablet by mouth daily.   Yes [provider]  ondansetron (ZOFRAN ODT) 8 MG disintegrating tablet Take 1 tablet (8 mg total) by mouth every 8 (eight) hours as needed for nausea or vomiting. 01/18/17  Yes Truitt Merle, MD  prochlorperazine (COMPAZINE)  10 MG tablet Take 1 tablet (10 mg total) by mouth every 6 (six) hours as needed for nausea or vomiting. 07/27/16  Yes Truitt Merle, MD  capecitabine (XELODA) 500 MG tablet Take 2,000 mg by mouth 2 (two) times daily. 01/29/17   [provider]  dexamethasone (DECADRON) 4 MG tablet For 5 days after chemo (begin day 2) Patient not taking: Reported on 02/24/2017 01/18/17   Truitt Merle, MD  diphenoxylate-atropine (LOMOTIL)  2.5-0.025 MG tablet Take 2 tablets by mouth 4 (four) times daily as needed for diarrhea or loose stools. Patient not taking: Reported on 02/24/2017 12/29/16   Truitt Merle, MD  LORazepam (ATIVAN) 0.5 MG tablet Take 1 tablet (0.5 mg total) by mouth every 8 (eight) hours as needed (for nausea). Patient not taking: Reported on 02/24/2017 12/21/16   Truitt Merle, MD  magic mouthwash w/lidocaine SOLN Take 5 mLs by mouth 3 (three) times daily as needed for mouth pain. 02/24/17   Kinnie Feil, PA-C  pantoprazole (PROTONIX) 20 MG tablet Take 1 tablet (20 mg total) by mouth daily. 02/24/17   Kinnie Feil, PA-C  traMADol (ULTRAM) 50 MG tablet Take 1-2 tablets (50-100 mg total) by mouth every 6 (six) hours as needed for moderate pain or severe pain. Patient not taking: Reported on 02/24/2017 09/06/16   Truitt Merle, MD    Family History Family History  Problem Relation Age of Onset  . Cancer Mother        lung cancer  . Stroke Mother   . Hypertension Father   . CAD Father   . Cancer Maternal Grandfather        prostate cancer     Social History Social History  Substance Use Topics  . Smoking status: Former Smoker    Years: 2.00    Types: Cigarettes    Quit date: 42  . Smokeless tobacco: Never Used  . Alcohol use 2.4 oz/week    4 Cans of beer per week     Comment: social, none since colon surgery     Allergies   Penicillins   Review of Systems Review of Systems  Constitutional: Positive for appetite change. Negative for chills and fever.  HENT: Positive for mouth sores. Negative for sore throat.   Respiratory: Positive for cough. Negative for shortness of breath.   Cardiovascular: Negative for chest pain.  Gastrointestinal: Positive for diarrhea, nausea and vomiting. Negative for abdominal distention, abdominal pain, blood in stool and constipation.  Genitourinary: Negative for difficulty urinating, dysuria and hematuria.  Musculoskeletal: Negative for arthralgias and back pain.    Skin: Negative for rash.  Allergic/Immunologic: Positive for immunocompromised state.  Neurological: Negative for light-headedness.     Physical Exam Updated Vital Signs BP (!) 120/96 (BP Location: Right Arm)   Pulse 75   Temp 97.7 F (36.5 C) (Oral)   Resp 20   SpO2 99%   Physical Exam  Constitutional: He is oriented to person, place, and time. He appears well-developed and well-nourished. No distress.  HENT:  Head: Normocephalic and atraumatic.  Nose: Nose normal.  Mouth/Throat: No oropharyngeal exudate.  +Dry mucous membranes Less than half a centimeter ulcerated lesion to the lingual aspect of the right lower gumline, mildly tender No thrush noted Oropharynx and tonsils normal  Eyes: Pupils are equal, round, and reactive to light. Conjunctivae and EOM are normal.  Neck: Normal range of motion. Neck supple.  No cervical adenopathy  Cardiovascular: Normal rate, regular rhythm, normal heart sounds and intact distal pulses.  No murmur heard. Pulses:      Carotid pulses are 2+ on the right side, and 2+ on the left side.      Radial pulses are 2+ on the right side, and 2+ on the left side.       Dorsalis pedis pulses are 2+ on the right side, and 2+ on the left side.  No lower extremity edema or calf tenderness  Pulmonary/Chest: Effort normal. No respiratory distress. He has wheezes. He has no rales.  Faint, expiratory coarse wheezing to left lower lobe, posteriorly  Abdominal: Soft. Bowel sounds are normal. He exhibits no distension. There is no tenderness.  Soft and nontender, no rigidity, rebound or guarding No suprapubic or CVA tenderness  Genitourinary:  Genitourinary Comments: Chaperone was present.  I was able to palpate the first 2-3cm of the rectum digitally without gross abnormalities or masses.  Patient without pain in perianal/rectal area with palpation.  There are no external fissures or external hemorrhoids noted.  No induration or swelling of the perianal  skin.   Stool color is brown with no gross blood noted.   No signs of perirectal abscess.   DRE reveals good sphincter tone.    Musculoskeletal: Normal range of motion. He exhibits no deformity.  Neurological: He is alert and oriented to person, place, and time.  Skin: Skin is warm and dry. Capillary refill takes less than 2 seconds.  Psychiatric: He has a normal mood and affect. His behavior is normal. Judgment and thought content normal.  Nursing note and vitals reviewed.    ED Treatments / Results  Labs (all labs ordered are listed, but only abnormal results are displayed) Labs Reviewed  CBC WITH DIFFERENTIAL/PLATELET - Abnormal; Notable for the following:       Result Value   RBC 3.88 (*)    Hemoglobin 12.8 (*)    HCT 38.5 (*)    RDW 17.6 (*)    All other components within normal limits  COMPREHENSIVE METABOLIC PANEL - Abnormal; Notable for the following:    Creatinine, Ser 1.26 (*)    All other components within normal limits  LIPASE, BLOOD  PROTIME-INR  FIBRINOGEN  POC OCCULT BLOOD, ED    EKG  EKG Interpretation None       Radiology Dg Chest 2 View  Result Date: 02/24/2017 CLINICAL DATA:  Emesis. Patient is undergoing chemotherapy for liver cancer. EXAM: CHEST  2 VIEW COMPARISON:  CT of the chest 09/04/2016 FINDINGS: Injectable port terminates at the expected location of the cavoatrial junction. Cardiomediastinal silhouette is normal. Mediastinal contours appear intact. There is no evidence of focal airspace consolidation, pleural effusion or pneumothorax. Osseous structures are without acute abnormality. Soft tissues are grossly normal. IMPRESSION: No active cardiopulmonary disease. Electronically Signed   By: Fidela Salisbury M.D.   On: 02/24/2017 15:36    Procedures Procedures (including critical care time)  Medications Ordered in ED Medications  heparin lock flush 100 unit/mL (not administered)  sodium chloride 0.9 % bolus 1,000 mL (0 mLs Intravenous  Stopped 02/24/17 1744)  sodium chloride 0.9 % bolus 1,000 mL (0 mLs Intravenous Stopped 02/24/17 1744)  pantoprazole (PROTONIX) injection 40 mg (40 mg Intravenous Given 02/24/17 1550)     Initial Impression / Assessment and Plan / ED Course  I have reviewed the triage vital signs and the nursing notes.  Pertinent labs & imaging results that were available during my care of the patient were reviewed by me and considered in my medical decision making (see  chart for details).  Clinical Course as of Feb 25 1748  Sat Feb 24, 2017  1656 Dr. Cristina Gong suspects MWT, however also considering erosive esophagitis and variceal bleed also less likely. Dr Cristina Gong offered ED endoscopy but pt declined, he is concerned about costs. Dr.Buccini offered clinic f/u next week. Pt agreeable.   [CG]    Clinical Course User Index [CG] Kinnie Feil, PA-C   56 year old male with history of colon cancer with metastasis to liver on chemotherapy presents to the ED for nausea and hematemesis. He has chronic nausea and vomiting with some streaks of bright red blood however this morning there was significant blood in emesis. No chest pain, shortness of breath, tearing back pain. Denies history of PUD, NSAID abuse, melena or hematochezia. Does drink 3-4 beers nightly. Exam is reassuring, he is hemodynamically stable. No abdominal tenderness. Hemoccult negative.  Lab work today is reassuring. Stable anemia. No leukocytosis or neutropenia. Liver function is normal, PT/INR and fibrinogen normal as well. Patient given 2 L IV fluids and Protonix in ED. Gastroenterology was consulted. Greatly appreciate Dr. Mirna Mires recommendations and assistance. 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 Given almost daily nausea and emesis from chemo, Dr. Cristina Gong offered endoscopy in the ED to further quantify extent of possible tear. However, patient declined this and would like to be discharged.  Discussed risks and benefits of  emergent endoscopy with patient and family at bedside however patient ultimately declined this. We'll discharge patient with Protonix, Magic mouthwash and follow up with GI as outpatient. Advised he avoided NSAIDs and EtOH. Discussed specific signs and symptoms that would work return to the ED for reevaluation.  Final Clinical Impressions(s) / ED Diagnoses   Final diagnoses:  Hematemesis with nausea    New Prescriptions New Prescriptions   MAGIC MOUTHWASH W/LIDOCAINE SOLN    Take 5 mLs by mouth 3 (three) times daily as needed for mouth pain.   PANTOPRAZOLE (PROTONIX) 20 MG TABLET    Take 1 tablet (20 mg total) by mouth daily.     Kinnie Feil, PA-C 02/24/17 1749    Forde Dandy, MD 02/25/17 [phone]

## 2017-02-24 NOTE — ED Notes (Signed)
Hemoccult stool was negative

## 2017-02-24 NOTE — ED Provider Notes (Signed)
Medical screening examination/treatment/procedure(s) were conducted as a shared visit with non-physician practitioner(s) and myself.  I personally evaluated the patient during the encounter.   EKG Interpretation None      56 year old male who presents with hematemesis. He has a history of colon cancer with metastasis to the liver. He was receiving 5-FU chemotherapy infusions from Thursday to stay. He states that he normally has nausea and vomiting, but today with 5-6 episodes of vomiting he has noted blood in the emesis. Denies any abdominal pain, melena, hematochezia. No anticoagulation or NSAID usage recently. No significant alcohol usage. Per recent imaging, liver metastases have been improving.  Hemodynamically stable with soft and benign abdomen. Stool is guaiac negative brown stool. He has a stable hemoglobin. Normal BUN/creatinine ratio. Clinically does not have signs of ongoing bleeding at this time. Suspect possible Mallory-Weiss tear. Did briefly discuss with Dr. Cristina Gong, who offered ED endoscopy but agrees like Mallory-Weiss tear and outpatient management. Patient feels improved, declines ED endoscopy and would like to follow-up as outpatient. Will return to ED for persistent or worsening bleeding. Strict return and follow-up instructions reviewed. He expressed understanding of all discharge instructions and felt comfortable with the plan of care.    Forde Dandy, MD 02/24/17 [phone]

## 2017-02-24 NOTE — Progress Notes (Signed)
Pt presents to [facility] for pump removal reporting recent non-productive cough, vomiting, shaking and mouth sores. Pt reports that he feels "dehydrated" Pt also reports that this AM he has been vomiting blood in moderate amounts that is bright red in color. Pt denies any blood in stool. Upon assessment lungs sound clear bilaterally. Pt states that he has a prescription for magic mouth wash. This RN recommended that pt go to ED immediately due to vomiting blood. Pt responds that he'll "think about it". This RN reinforced to pt the recommendation to go to the ED. Pt's VSS, ambulatory.

## 2017-02-26 ENCOUNTER — Encounter: Payer: Self-pay | Admitting: Interventional Radiology

## 2017-03-15 ENCOUNTER — Ambulatory Visit (HOSPITAL_BASED_OUTPATIENT_CLINIC_OR_DEPARTMENT_OTHER): Payer: BLUE CROSS/BLUE SHIELD

## 2017-03-15 ENCOUNTER — Encounter: Payer: Self-pay | Admitting: Hematology

## 2017-03-15 ENCOUNTER — Ambulatory Visit (HOSPITAL_BASED_OUTPATIENT_CLINIC_OR_DEPARTMENT_OTHER): Payer: BLUE CROSS/BLUE SHIELD | Admitting: Hematology

## 2017-03-15 ENCOUNTER — Telehealth: Payer: Self-pay | Admitting: Hematology

## 2017-03-15 ENCOUNTER — Other Ambulatory Visit (HOSPITAL_BASED_OUTPATIENT_CLINIC_OR_DEPARTMENT_OTHER): Payer: BLUE CROSS/BLUE SHIELD

## 2017-03-15 ENCOUNTER — Other Ambulatory Visit: Payer: Self-pay | Admitting: Emergency Medicine

## 2017-03-15 VITALS — BP 108/63 | HR 59 | Temp 97.9°F | Resp 18

## 2017-03-15 VITALS — BP 110/74 | HR 77 | Temp 98.0°F | Resp 20 | Ht 71.0 in | Wt 203.0 lb

## 2017-03-15 DIAGNOSIS — D6481 Anemia due to antineoplastic chemotherapy: Secondary | ICD-10-CM

## 2017-03-15 DIAGNOSIS — K1231 Oral mucositis (ulcerative) due to antineoplastic therapy: Secondary | ICD-10-CM | POA: Diagnosis not present

## 2017-03-15 DIAGNOSIS — Z5112 Encounter for antineoplastic immunotherapy: Secondary | ICD-10-CM | POA: Diagnosis not present

## 2017-03-15 DIAGNOSIS — D509 Iron deficiency anemia, unspecified: Secondary | ICD-10-CM

## 2017-03-15 DIAGNOSIS — C182 Malignant neoplasm of ascending colon: Secondary | ICD-10-CM

## 2017-03-15 DIAGNOSIS — C787 Secondary malignant neoplasm of liver and intrahepatic bile duct: Secondary | ICD-10-CM | POA: Diagnosis not present

## 2017-03-15 DIAGNOSIS — Z5111 Encounter for antineoplastic chemotherapy: Secondary | ICD-10-CM | POA: Diagnosis not present

## 2017-03-15 DIAGNOSIS — I10 Essential (primary) hypertension: Secondary | ICD-10-CM

## 2017-03-15 DIAGNOSIS — Z452 Encounter for adjustment and management of vascular access device: Secondary | ICD-10-CM

## 2017-03-15 DIAGNOSIS — C189 Malignant neoplasm of colon, unspecified: Secondary | ICD-10-CM

## 2017-03-15 DIAGNOSIS — R030 Elevated blood-pressure reading, without diagnosis of hypertension: Secondary | ICD-10-CM

## 2017-03-15 DIAGNOSIS — D500 Iron deficiency anemia secondary to blood loss (chronic): Secondary | ICD-10-CM

## 2017-03-15 DIAGNOSIS — Z95828 Presence of other vascular implants and grafts: Secondary | ICD-10-CM

## 2017-03-15 DIAGNOSIS — T148XXA Other injury of unspecified body region, initial encounter: Secondary | ICD-10-CM

## 2017-03-15 LAB — CBC WITH DIFFERENTIAL/PLATELET
BASO%: 0.7 % (ref 0.0–2.0)
Basophils Absolute: 0 10*3/uL (ref 0.0–0.1)
EOS%: 1.4 % (ref 0.0–7.0)
Eosinophils Absolute: 0.1 10*3/uL (ref 0.0–0.5)
HCT: 37 % — ABNORMAL LOW (ref 38.4–49.9)
HGB: 12.1 g/dL — ABNORMAL LOW (ref 13.0–17.1)
LYMPH%: 17.4 % (ref 14.0–49.0)
MCH: 33.3 pg (ref 27.2–33.4)
MCHC: 32.7 g/dL (ref 32.0–36.0)
MCV: 101.9 fL — ABNORMAL HIGH (ref 79.3–98.0)
MONO#: 0.5 10*3/uL (ref 0.1–0.9)
MONO%: 10.4 % (ref 0.0–14.0)
NEUT#: 3 10*3/uL (ref 1.5–6.5)
NEUT%: 70.1 % (ref 39.0–75.0)
Platelets: 164 10*3/uL (ref 140–400)
RBC: 3.63 10*6/uL — ABNORMAL LOW (ref 4.20–5.82)
RDW: 16.9 % — ABNORMAL HIGH (ref 11.0–14.6)
WBC: 4.3 10*3/uL (ref 4.0–10.3)
lymph#: 0.8 10*3/uL — ABNORMAL LOW (ref 0.9–3.3)

## 2017-03-15 LAB — COMPREHENSIVE METABOLIC PANEL
ALBUMIN: 3.9 g/dL (ref 3.5–5.0)
ALK PHOS: 66 U/L (ref 40–150)
ALT: 17 U/L (ref 0–55)
AST: 18 U/L (ref 5–34)
Anion Gap: 9 mEq/L (ref 3–11)
BUN: 19.3 mg/dL (ref 7.0–26.0)
CHLORIDE: 106 meq/L (ref 98–109)
CO2: 24 meq/L (ref 22–29)
Calcium: 9.5 mg/dL (ref 8.4–10.4)
Creatinine: 1.1 mg/dL (ref 0.7–1.3)
EGFR: >60 mL/min/{1.73_m2} (ref >60)
GLUCOSE: 109 mg/dL (ref 70–140)
POTASSIUM: 4.7 meq/L (ref 3.5–5.1)
SODIUM: 139 meq/L (ref 136–145)
Total Bilirubin: 0.43 mg/dL (ref 0.20–1.20)
Total Protein: 7 g/dL (ref 6.4–8.3)

## 2017-03-15 LAB — IRON AND TIBC
%SAT: 23 % (ref 20–55)
Iron: 91 ug/dL (ref 42–163)
TIBC: 396 ug/dL (ref 202–409)
UIBC: 305 ug/dL (ref 117–376)

## 2017-03-15 LAB — FERRITIN: Ferritin: 87 ng/ml (ref 22–316)

## 2017-03-15 LAB — UA PROTEIN, DIPSTICK - CHCC: PROTEIN: NEGATIVE mg/dL

## 2017-03-15 LAB — CEA (IN HOUSE-CHCC): CEA (CHCC-IN HOUSE): 4.81 ng/mL (ref 0.00–5.00)

## 2017-03-15 MED ORDER — DEXAMETHASONE SODIUM PHOSPHATE 10 MG/ML IJ SOLN
8.0000 mg | Freq: Once | INTRAMUSCULAR | Status: AC
  Administered 2017-03-15: 8 mg via INTRAVENOUS

## 2017-03-15 MED ORDER — PALONOSETRON HCL INJECTION 0.25 MG/5ML
0.2500 mg | Freq: Once | INTRAVENOUS | Status: AC
  Administered 2017-03-15: 0.25 mg via INTRAVENOUS

## 2017-03-15 MED ORDER — SODIUM CHLORIDE 0.9% FLUSH
10.0000 mL | Freq: Once | INTRAVENOUS | Status: AC
  Administered 2017-03-15: 10 mL
  Filled 2017-03-15: qty 10

## 2017-03-15 MED ORDER — SODIUM CHLORIDE 0.9 % IV SOLN
5.0000 mg/kg | Freq: Once | INTRAVENOUS | Status: AC
  Administered 2017-03-15: 450 mg via INTRAVENOUS
  Filled 2017-03-15: qty 16

## 2017-03-15 MED ORDER — SODIUM CHLORIDE 0.9 % IV SOLN: 8.0000 mg | Freq: Once | INTRAVENOUS | Status: DC

## 2017-03-15 MED ORDER — DEXAMETHASONE SODIUM PHOSPHATE 10 MG/ML IJ SOLN
INTRAMUSCULAR | Status: AC
  Filled 2017-03-15: qty 1

## 2017-03-15 MED ORDER — FLUOROURACIL CHEMO INJECTION 5 GM/100ML
2200.0000 mg/m2 | INTRAVENOUS | Status: DC
  Administered 2017-03-15: 4600 mg via INTRAVENOUS
  Filled 2017-03-15: qty 92

## 2017-03-15 MED ORDER — AMLODIPINE BESYLATE 10 MG PO TABS: 10.0000 mg | ORAL_TABLET | Freq: Every day | ORAL | 2 refills | Status: DC

## 2017-03-15 MED ORDER — DEXTROSE 5 % IV SOLN
Freq: Once | INTRAVENOUS | Status: AC
  Administered 2017-03-15: 10:00:00 via INTRAVENOUS

## 2017-03-15 MED ORDER — PALONOSETRON HCL INJECTION 0.25 MG/5ML
INTRAVENOUS | Status: AC
  Filled 2017-03-15: qty 5

## 2017-03-15 MED ORDER — UREA 10 % EX CREA: TOPICAL_CREAM | Freq: Three times a day (TID) | CUTANEOUS | 1 refills | Status: DC

## 2017-03-15 MED ORDER — MAGIC MOUTHWASH W/LIDOCAINE: 5.0000 mL | Freq: Three times a day (TID) | ORAL | 0 refills | Status: DC | PRN

## 2017-03-15 MED ORDER — DEXTROSE 5 % IV SOLN
400.0000 mg/m2 | Freq: Once | INTRAVENOUS | Status: AC
  Administered 2017-03-15: 840 mg via INTRAVENOUS
  Filled 2017-03-15: qty 42

## 2017-03-15 NOTE — Patient Instructions (Addendum)
[facility] Discharge Instructions for Patients Receiving Chemotherapy  Today you received the following chemotherapy agents: Avastin, Leucovorin, and 5FU  To help prevent nausea and vomiting after your treatment, we encourage you to take your nausea medication as directed   If you develop nausea and vomiting that is not controlled by your nausea medication, call the clinic.   BELOW ARE SYMPTOMS THAT SHOULD BE REPORTED IMMEDIATELY:  *FEVER GREATER THAN 100.5 F  *CHILLS WITH OR WITHOUT FEVER  NAUSEA AND VOMITING THAT IS NOT CONTROLLED WITH YOUR NAUSEA MEDICATION  *UNUSUAL SHORTNESS OF BREATH  *UNUSUAL BRUISING OR BLEEDING  TENDERNESS IN MOUTH AND THROAT WITH OR WITHOUT PRESENCE OF ULCERS  *URINARY PROBLEMS  *BOWEL PROBLEMS  UNUSUAL RASH Items with * indicate a potential emergency and should be followed up as soon as possible.  Feel free to call the clinic should you have any questions or concerns. The clinic phone number is [phone].  Please show the CHEMO ALERT CARD at check-in to the Emergency Department and triage nurse.   

## 2017-03-15 NOTE — Progress Notes (Signed)
[place]  [phone] [phone]  Clinic Follow up Note   Patient Care Team: Curlene Labrum, MD as PCP - General (Family Medicine) 03/15/2017  SUMMARY OF ONCOLOGIC HISTORY: Oncology History   Cancer Staging Metastatic colon cancer to liver [facility]) Staging form: Colon and Rectum, AJCC 8th Edition - Clinical stage from 06/01/2016: Stage IVA (cTX, cNX, pM1a) - Signed by Truitt Merle, MD on 07/04/2016 - Pathologic stage from 06/14/2016: Stage IVA (pT4b(m), pN2b, pM1a) - Signed by Truitt Merle, MD on 07/04/2016       Metastatic colon cancer to liver ([place])   04/2015 Procedure    Colonoscopy by Dr. Ladona Horns. It showed showed 2 sessile polyps ready between 3-5 mm in size located 20 cm (A, B) from the point of entry, polypectomy was performed. Pedunculated polyp was found in the ascending colon (C), polypectomy was performed, and additional polyp (D) was found 30 cm from the point of entry, removed      04/2015 Pathology Results    tubular adenoma (A and B), and well differentiated adenocarcinoma arising from tubulovillous adenoma (C) and well differentiated adenocarcinoma arising from severe dysplasia to intramucosal carcinoma within tubular adenoma.       04/2015 Initial Diagnosis    Metastatic colon cancer to liver ([place])      05/29/2016 Imaging    CT abdomen and pelvis with contrast showed an apple core like stricture in right colon just above the cecum, measuring 3.2 cm in lengths. This is highly suspicious for malignancy. Small lymph node a noticed he had adjacent mesentery, measuring 8 mm. There is a low-density lesion within the inferior aspect of the right hepatic lobe measuring 1.7 cm, suspicious for metastasis.       06/06/2016 Tumor Marker    CEA 9.99      06/08/2016 PET scan    IMPRESSION: Approximately 3 cm hypermetabolic mass in the ascending colon, consistent with primary colon carcinoma. This mass results in colonic obstruction and small  bowel dilatation. Additional areas of hypermetabolic wall thickening in the cecum may represent other sites of colon carcinoma or colitis. Mild hypermetabolic lymphadenopathy in right pericolonic region, porta hepatis, and aortocaval space, consistent with metastatic disease. Mild hypermetabolic mediastinal lymphadenopathy also seen, and thoracic lymph node metastases cannot be excluded. Solitary hypermetabolic focus in inferior right hepatic lobe, consistent with liver metastasis. Consider abdomen MRI without and with contrast for further evaluation.      06/14/2016 Surgery    Hand assisted right hemicolectomy and small bowel resection for colon cancer, liver biopsy, by Dr. Barry Dienes      06/14/2016 Pathology Results    Right hemicolectomy showed invasive well to moderately differentiated adenocarcinoma, 2 foci measuring 7.5 cm and 4.5 cm, tumor invades through full thickness of colon, to the seroma and involve the Small Bowel, Surgical Margins Were Negative, 24 Out Of 64 Lymph Nodes Were Positive, Extracapsular Extension Identified, Multiple Satellite Tumor Deposits Present, Liver Biopsy Showed Metastatic Adenocarcinoma.        06/14/2016 Miscellaneous    Tumor MMR normal, MSI stable       06/14/2016 Miscellaneous    Foundation one genomic testing showed K-ras G12 D mutation, APC and TP53 mutation. No BRAF and NRAS mutation. MSI-stable, tumor burden low.      07/06/2016 Tumor Marker    CEA 13.69      07/13/2016 - 01/18/2017 Chemotherapy    mFOLFOX, every 2 weeks, started on 07/14/2015, Avastin added from cycle 3  Oxaliplatin dose to  $'60mg'C$ /m2 due to side effects and some cytopenia on 09/21/16  Changed to FOLFIRINOX starting cycle 7 and Reduced  Due to neuropathy hold Oxaliplatin and add Irinotecan with neulasta on day 3 starting with cycle 7 Add low dose Oxaliplatin with cycle 8.  Due to his worsening neuropathy, and good response to chemotherapy, I previously stopped oxaliplatin from cycle 11,  and continue FOLFIRI and avastin         07/27/2016 Tumor Marker    CEA 15.85      09/04/2016 Imaging    Ct C/A/P W Contrast IMPRESSION: Interval right colectomy. Stable small liver metastasis in the inferior right hepatic lobe. Stable mild porta hepatis and aortocaval lymphadenopathy. Stable mild mediastinal lymphadenopathy. No new or progressive metastatic disease identified within the chest, abdomen, or pelvis.      12/19/2016 PET scan    IMPRESSION: 1. Right hemicolectomy, with resolution of the prior hypermetabolic activity inferiorly in the right hepatic lobe, in several mediastinal lymph nodes, and in lymph nodes in the retroperitoneum and porta hepatis. No residual hypermetabolic or enlarged lymph nodes are identified. 2. Low-grade diffuse skeletal metabolic activity is likely therapy related. 3. Coronary atherosclerosis. 4. 3 by 4 mm right middle lobe pulmonary nodule is stable, not appreciably hypermetabolic, but below sensitive PET-CT size thresholds. This may warrant surveillance.      01/11/2017 Imaging    MR Abdomen W WO Contrast IMPRESSION: 1. No acute findings within the abdomen. Previously noted liver metastasis has resolved in the interval. No new lesions.      02/01/2017 -  Chemotherapy    Maintenance therapy, Xeloda '2000mg'$  (1000 mg/m2)  q12h on day 1-14 every 21 days plus AVASTIN, starting 02/01/2017.  stopped after 12 days due to poor tolerance on 02/14/17  Changed to maintenance 5-FU and avastin every 2 weeks starting on 02/22/17       HISTORY OF PRESENTING ILLNESS (06/01/2016):  Naheim Burgen 56 y.o. male is here because of his recently abdominal CT which is highly suspicious for metastatic colon cancer. He is accompanied by his wife to my clinic today. He was referred by his primary care physician Dr. Pleas Koch.   He had colonoscopy in 2016 for mild rectal bleeding, he describe small amount fresh blood mixed with stool, he has no other constitutional  symptoms at that time. He was referred to gastroenterologist Dr. Ladona Horns and underwent a colonoscopy in December 2016. The colonoscopy showed 2 sessile polyps ready between 3-5 mm in size located 20 cm (A, B) from the point of entry, polypectomy was performed. Pedunculated polyp was found in the ascending colon (C), polypectomy was performed, and additional polyp (D) was found 30 cm from the point of entry, removed. The pathology reviewed tubular adenoma (A and B), and well differentiated adenocarcinoma arising from tubulovillous adenoma (C) and well differentiated adenocarcinoma arising from severe dysplasia to intramucosal carcinoma within tubular adenoma. Dr. Tye Maryland tried multiple times to reach patient, but patient sought they were calling him about the bill, and did not return the phone calls. He was not aware the cancer diagnosis until recently.   He started having diarrhea and vomiting in mid Dec 2017, and felt a "pop" in right side abdomen, he was seen at urgent care, and was treated with antiemetics, and IVF, lab tests were OK. Due to his persistent intermittent diarrhea and epigastric pain since then, he was seen by PCP and he eventually had CT abdomen and pelvis scan which showed a upper core lesion in the ascending colon,  and I'll 1.7 cm lesion in the liver, highly suspicious for metastasis. He was referred to [place] for further evaluation.  He has lost 30 lbs in the past one month, has low appetite, eats a small meals 1-2 times a day. He has moderate fatigue, able to tolerate routine activities including his work, but feels exhausted at the end of study. He has occasional constipation, denies recent rectal bleeding.  CURRENT THERAPY: Maintenance 5-Fu and Avastin started 01/04/2017, he tried one cycle Xeloda to replace 5-fu, but could not tolerate  INTERVAL HISTORY: Mr. Ribas returns today for follow-up as scheduled prior to next cycle 5-FU/Avastin, last dose 02/22/17 to 02/24/17. On the morning  of his pump DC he reported to the nurse that he had hematemesis, he went to the ED for further evaluation. He declined endoscopic evaluation, it was determined he likely had Mallory-Weiss tear from vomiting and was discharged home in stable condition. He has had no further episodes of vomiting. He continues to have small amounts of blood with teeth brushing and stable productive cough with blood-streaked sputum upon wakening. Present for months, improving overall. Stable numbness and tingling to hands and feet, constant, not impairing function but does limit dexterity and strength at times. Occasionally stumbles while walking, discovered new blisters to both feet since last cycle but not painful. One mouth sore still present but improving, does not limit po intake. Did not use magic mouthwash because he didn't like the taste and felt it did not help much. Today he denies nausea, vomiting, constipation, diarrhea, abdominal pain. No fever, chills, chest pain, shortness of breath. He wants to have cataract surgery in December between cycles of chemotherapy.   REVIEW OF SYSTEMS:   Constitutional: Denies fevers, chills or abnormal weight loss (+) intentional weight gain (+) mild fatigue, improving Eyes: Denies blurriness of vision (+) pending cataract surgery 04/2017 Ears, nose, mouth, throat, and face: sore throat (+) mucositis, right lower gum (+) mild gum bleeding with brushing Respiratory: Denies  dyspnea or wheezes (+) stable productive cough with blood-streaked sputum upon wakening, present for months, improving  Cardiovascular: Denies palpitation, chest discomfort or lower extremity swelling Gastrointestinal:  Denies nausea, vomiting, constipation, diarrhea, heartburn, abdominal pain, or change in bowel habits Skin: Denies abnormal skin rashes (+) blisters to both feet, nontender Lymphatics: Denies new lymphadenopathy or easy bruising Neurological:Denies  new weaknesses (+) stable constant  numbness/tingling, able to function but limits dexterity and strength  Behavioral/Psych: Mood is stable, no new changes  All other systems were reviewed with the patient and are negative.  MEDICAL HISTORY:  Past Medical History:  Diagnosis Date  . Anxiety   . Cancer of ascending colon ([place])   . Seasonal allergies     SURGICAL HISTORY: Past Surgical History:  Procedure Laterality Date  . APPENDECTOMY  1992  . CATARACT EXTRACTION W/ INTRAOCULAR LENS IMPLANT Left 04/2016  . COLON SURGERY    . COLONOSCOPY W/ BIOPSIES AND POLYPECTOMY  04/2015  . INGUINAL HERNIA REPAIR Right 1982  . IR RADIOLOGIST EVAL & MGMT  01/16/2017  . KNEE ARTHROSCOPY W/ MENISCECTOMY Right 1998  . LAPAROSCOPIC RIGHT COLECTOMY N/A 06/14/2016   Procedure: LAPAROSCOPIC HAND ASSISTED HEMICOLECTOMY AND SMALL BOWEL RESECTION.;  Surgeon: [name], MD;  Location: [place];  Service: General;  Laterality: N/A;  . LIVER BIOPSY Right 06/14/2016   Procedure: LIVER BIOPSY;  Surgeon: [name], MD;  Location: [place];  Service: General;  Laterality: Right;  Right Inferior Liver  . PORTACATH PLACEMENT N/A 07/06/2016  Procedure: INSERTION PORT-A-CATH;  Surgeon: [name], MD;  Location: [place];  Service: General;  Laterality: N/A;  . VASECTOMY  2005    I have reviewed the social history and family history with the patient and they are unchanged from previous note.  ALLERGIES:  is allergic to penicillins and xeloda [capecitabine].  MEDICATIONS:  Current Outpatient Prescriptions  Medication Sig Dispense Refill  . amLODipine (NORVASC) 10 MG tablet Take 1 tablet (10 mg total) by mouth daily. 30 tablet 2  . celecoxib (CELEBREX) 200 MG capsule Take 200 mg by mouth daily.  1  . FLUoxetine (PROZAC) 40 MG capsule Take 40 mg by mouth daily.    Marland Kitchen lidocaine-prilocaine (EMLA) cream Apply to affected area once 30 g 3  . lisinopril (PRINIVIL) 10 MG tablet Take 1 tablet (10 mg total) by mouth daily. 90 tablet 1  .  LORazepam (ATIVAN) 0.5 MG tablet Take 1 tablet (0.5 mg total) by mouth every 8 (eight) hours as needed (for nausea). 30 tablet 0  . magic mouthwash w/lidocaine SOLN Take 5 mLs by mouth 3 (three) times daily as needed for mouth pain. 5 mL 0  . Multiple Vitamin (MULTIVITAMIN WITH MINERALS) TABS tablet Take 1 tablet by mouth daily.    . ondansetron (ZOFRAN ODT) 8 MG disintegrating tablet Take 1 tablet (8 mg total) by mouth every 8 (eight) hours as needed for nausea or vomiting. 30 tablet 2  . prochlorperazine (COMPAZINE) 10 MG tablet Take 1 tablet (10 mg total) by mouth every 6 (six) hours as needed for nausea or vomiting. 30 tablet 2  . dexamethasone (DECADRON) 4 MG tablet For 5 days after chemo (begin day 2) (Patient not taking: Reported on 02/24/2017) 10 tablet 0  . diphenoxylate-atropine (LOMOTIL) 2.5-0.025 MG tablet Take 2 tablets by mouth 4 (four) times daily as needed for diarrhea or loose stools. (Patient not taking: Reported on 02/24/2017) 30 tablet 0  . traMADol (ULTRAM) 50 MG tablet Take 1-2 tablets (50-100 mg total) by mouth every 6 (six) hours as needed for moderate pain or severe pain. (Patient not taking: Reported on 02/24/2017) 30 tablet 0  . urea (CARMOL) 10 % cream Apply topically 3 (three) times daily. 71 g 1   No current facility-administered medications for this visit.    Facility-Administered Medications Ordered in Other Visits  Medication Dose Route Frequency Provider Last Rate Last Dose  . bevacizumab (AVASTIN) 450 mg in sodium chloride 0.9 % 100 mL chemo infusion  5 mg/kg (Treatment Plan Recorded) Intravenous Once Truitt Merle, MD      . clindamycin (CLEOCIN) 900 mg in dextrose 5 % 50 mL IVPB  900 mg Intravenous 60 min Pre-Op [name], MD       And  . gentamicin (GARAMYCIN) 450 mg in dextrose 5 % 50 mL IVPB  5 mg/kg Intravenous 60 min Pre-Op [name], MD      . fluorouracil (ADRUCIL) 4,600 mg in sodium chloride 0.9 % 58 mL chemo infusion  2,200 mg/m2 (Treatment Plan  Recorded) Intravenous 1 day or 1 dose Truitt Merle, MD      . heparin lock flush 100 unit/mL  500 Units Intracatheter Once PRN Truitt Merle, MD      . leucovorin 840 mg in dextrose 5 % 250 mL infusion  400 mg/m2 (Treatment Plan Recorded) Intravenous Once Truitt Merle, MD      . sodium chloride flush (NS) 0.9 % injection 10 mL  10 mL Intracatheter PRN Truitt Merle, MD  PHYSICAL EXAMINATION: ECOG PERFORMANCE STATUS: 2 - Symptomatic, <50% confined to bed  Vitals:   03/15/17 0847  BP: 110/74  Pulse: 77  Resp: 20  Temp: 98 F (36.7 C)  SpO2: 100%   Filed Weights   03/15/17 0847  Weight: 203 lb (92.1 kg)    GENERAL:alert, no distress and comfortable SKIN: skin color, texture, turgor are normal, no rashes (+) large healing blister to forefoot bilaterally, spanning approx 5 cm width with skin peeling EYES: normal, Conjunctiva are pink and non-injected, sclera clear OROPHARYNX:no exudate, no erythema and lips, buccal mucosa, and tongue normal. (+) small ulcer to right lower lingual gingiva, healing well  NECK: supple, thyroid normal size, non-tender, without nodularity LYMPH:  no palpable cervical or supraclavicular lymphadenopathy LUNGS: clear to auscultation bilaterally with normal breathing effort HEART: regular rate & rhythm and no murmurs and no lower extremity edema ABDOMEN:abdomen soft, non-tender and normal bowel sounds. No palpable hepatomegaly or masses  Musculoskeletal:no cyanosis of digits and no clubbing  NEURO: alert & oriented x 3 with fluent speech, no focal motor deficits (+) mildly decreased vibratory sense to hands and feet bilaterally per tuning fork exam  PAC without erythema  LABORATORY DATA:  I have reviewed the data as listed CBC Latest Ref Rng & Units 03/15/2017 02/24/2017 02/22/2017  WBC 4.0 - 10.3 10e3/uL 4.3 6.3 3.4(L)  Hemoglobin 13.0 - 17.1 g/dL 12.1(L) 12.8(L) 11.9(L)  Hematocrit 38.4 - 49.9 % 37.0(L) 38.5(L) 35.5(L)  Platelets 140 - 400 10e3/uL 164 166 175      CMP Latest Ref Rng & Units 03/15/2017 02/24/2017 02/22/2017  Glucose 70 - 140 mg/dl 109 87 113  BUN 7.0 - 26.0 mg/dL 19.3 18 12.6  Creatinine 0.7 - 1.3 mg/dL 1.1 1.26(H) 1.1  Sodium 136 - 145 mEq/L 139 137 140  Potassium 3.5 - 5.1 mEq/L 4.7 4.0 4.5  Chloride 101 - 111 mmol/L - 102 -  CO2 22 - 29 mEq/L '24 25 25  '$ Calcium 8.4 - 10.4 mg/dL 9.5 8.9 9.2  Total Protein 6.4 - 8.3 g/dL 7.0 7.1 6.9  Total Bilirubin 0.20 - 1.20 mg/dL 0.43 1.1 0.48  Alkaline Phos 40 - 150 U/L 66 57 57  AST 5 - 34 U/L '18 25 16  '$ ALT 0 - 55 U/L '17 18 12     '$ RADIOGRAPHIC STUDIES: I have personally reviewed the radiological images as listed and agreed with the findings in the report. No results found.   ASSESSMENT & PLAN: 56 y.o. Caucasian male, without significant past medical history, presented with rectal bleeding in December 2016, colonoscopy showed 4 polyps, 2 of them showed invasive adenocarcinoma, unfortunately he was not aware of the pathology findings and was not treated. He now presented with fatigue, weight loss, anorexia and abdominal pain  1. Right colon cancer with liver and node metastasis, pT4bN2bM1a, stage IV, MSI-stable, KRAS mutation (+) 2. HTN 3. Anemia of iron deficiency and chemotherapy 4. Anorexia and weight loss 5. Peripheral neuropathy, G2 6. Goals of care discussion 7. Productive cough 8. Mouth sores, secondary to xeloda 9. Hematemesis 10. Blistered skin on feet   Mr. Harvill appears stable today. He has not had recurrent episodes of hematemesis. This is likely due to mallory-weiss tear, may also be a component of mucositis in the upper GI tract secondary to chemotherapy. He continues to have side effects from Xeloda that are improving. He has very mild mucositis that is improving, he will continue salt water rinse, I will send new Rx for magic mouthwash  to add steroidal component.  This does not interfere with po intake, he has gained some weight back. He has significant skin  toxicity to his feet, I prescribed urea cream and recommend he alternate with hydrocortisone to decrease inflammation and promote healing. [name] recommends adding leucovorin and decreasing 5FU dose to decrease further skin toxicity. He will proceed with treatment today. He does not require neulasta, the treatment plan has been changed accordingly and Pharmacy is aware of the change. Due to increased nausea and vomiting after last cycle, he will get decadron and aloxi premeds and will maximize use of anti-emetics after chemo. We will reimage after 2 cycles, around the end of November, ordered today. He plans to have cataract surgery 05/01/17, we will postpone chemo by 1 week around that time, have requested adjusted schedule accordingly. Labs, VS, and exam otherwise adequate for treatment today. CEA 4.81 back in normal range; previously 6.66. I refilled norvasc today, BP stable.   PLAN: -continue salt water rinse and magic mouthwash for mucositis, sent new Rx with steroid to be added to MM -Prescription for urea cream; alternate with hydrocortisone cream -refilled norvasc -add lecovorin, dose reduce 5FU; proceed with next cycle today -add decadron and aloxi premeds, maximize anti-emetics after chemotherapy -CT after 2 cycles (04/10/17 approx), ordered today -return for lab, flush, f/u, and next cycle in 2 weeks    Orders Placed This Encounter  Procedures  . CT Abdomen Pelvis W Contrast    Standing Status:   Future    Standing Expiration Date:   03/15/2018    Order Specific Question:   If indicated for the ordered procedure, I authorize the administration of contrast media per Radiology protocol    Answer:   Yes    Order Specific Question:   Preferred imaging location?    Answer:   [facility]    Order Specific Question:   Radiology Contrast Protocol - do NOT remove file path    Answer:   [email or web address]    Order Specific Question:   Reason for Exam  additional comments    Answer:   restaging for metastatic colon cancer to liver on chemotherapy  . CT Chest W Contrast    Standing Status:   Future    Standing Expiration Date:   03/15/2018    Order Specific Question:   If indicated for the ordered procedure, I authorize the administration of contrast media per Radiology protocol    Answer:   Yes    Order Specific Question:   Preferred imaging location?    Answer:   [facility]    Order Specific Question:   Radiology Contrast Protocol - do NOT remove file path    Answer:   [email or web address]    Order Specific Question:   Reason for Exam additional comments    Answer:   restaging for metastatic colon cancer to liver on chemotherapy   All questions were answered. The patient knows to call the clinic with any problems, questions or concerns. No barriers to learning was detected.     Alla Feeling, NP 03/15/17   I have seen the patient, examined him. I agree with the assessment and and plan and have edited the notes.   Mr. Niemann has recovered well overall.  He did have significant nausea and vomiting after last cycle of 5-FU, and had a ED visit for mild hematemesis.  He also has mucositis and skin toxicity, I will add leucovorin back, and add premedication for nausea,  with slight decrease 5-fu dose by 10%.  We will see him back in 2 weeks before next cycle.  Truitt Merle  03/15/2017

## 2017-03-15 NOTE — Telephone Encounter (Signed)
Scheduled appt per 11/1 los - patient is aware of appt - my chart active - did not want calender or AVS printed.

## 2017-03-16 ENCOUNTER — Ambulatory Visit: Payer: BLUE CROSS/BLUE SHIELD | Admitting: Hematology

## 2017-03-16 ENCOUNTER — Ambulatory Visit: Payer: BLUE CROSS/BLUE SHIELD

## 2017-03-16 ENCOUNTER — Other Ambulatory Visit: Payer: BLUE CROSS/BLUE SHIELD

## 2017-03-17 ENCOUNTER — Ambulatory Visit (HOSPITAL_BASED_OUTPATIENT_CLINIC_OR_DEPARTMENT_OTHER): Payer: BLUE CROSS/BLUE SHIELD

## 2017-03-17 VITALS — BP 137/100 | HR 73 | Temp 97.6°F | Resp 17

## 2017-03-17 DIAGNOSIS — C787 Secondary malignant neoplasm of liver and intrahepatic bile duct: Principal | ICD-10-CM

## 2017-03-17 DIAGNOSIS — C182 Malignant neoplasm of ascending colon: Secondary | ICD-10-CM

## 2017-03-17 DIAGNOSIS — C189 Malignant neoplasm of colon, unspecified: Secondary | ICD-10-CM

## 2017-03-17 MED ORDER — HEPARIN SOD (PORK) LOCK FLUSH 100 UNIT/ML IV SOLN
500.0000 [IU] | Freq: Once | INTRAVENOUS | Status: AC | PRN
  Administered 2017-03-17: 500 [IU]
  Filled 2017-03-17: qty 5

## 2017-03-17 MED ORDER — SODIUM CHLORIDE 0.9% FLUSH
10.0000 mL | INTRAVENOUS | Status: DC | PRN
  Administered 2017-03-17: 10 mL
  Filled 2017-03-17: qty 10

## 2017-03-17 NOTE — Progress Notes (Signed)
Pt BP elevated and stated he took his BP medicine "20 minutes ago" or prior to arrival. Pt AAOX3. Pt denies any headache or vision changes. Pt denies any other complaints. Pt educated to retake BP at home and if not in normal range to call MD or seek emergency care. Pt verbalized understanding and had no further questions. Wife with pt who also verbalized understanding. Pt left ambulatory in no apparent distress.

## 2017-03-17 NOTE — Patient Instructions (Signed)
Implanted Port Home Guide An implanted port is a type of central line that is placed under the skin. Central lines are used to provide IV access when treatment or nutrition needs to be given through a person's veins. Implanted ports are used for long-term IV access. An implanted port may be placed because:  You need IV medicine that would be irritating to the small veins in your hands or arms.  You need long-term IV medicines, such as antibiotics.  You need IV nutrition for a long period.  You need frequent blood draws for lab tests.  You need dialysis.  Implanted ports are usually placed in the chest area, but they can also be placed in the upper arm, the abdomen, or the leg. An implanted port has two main parts:  Reservoir. The reservoir is round and will appear as a small, raised area under your skin. The reservoir is the part where a needle is inserted to give medicines or draw blood.  Catheter. The catheter is a thin, flexible tube that extends from the reservoir. The catheter is placed into a large vein. Medicine that is inserted into the reservoir goes into the catheter and then into the vein.  How will I care for my incision site? Do not get the incision site wet. Bathe or shower as directed by your health care provider. How is my port accessed? Special steps must be taken to access the port:  Before the port is accessed, a numbing cream can be placed on the skin. This helps numb the skin over the port site.  Your health care provider uses a sterile technique to access the port. ? Your health care provider must put on a mask and sterile gloves. ? The skin over your port is cleaned carefully with an antiseptic and allowed to dry. ? The port is gently pinched between sterile gloves, and a needle is inserted into the port.  Only "non-coring" port needles should be used to access the port. Once the port is accessed, a blood return should be checked. This helps ensure that the port  is in the vein and is not clogged.  If your port needs to remain accessed for a constant infusion, a clear (transparent) bandage will be placed over the needle site. The bandage and needle will need to be changed every week, or as directed by your health care provider.  Keep the bandage covering the needle clean and dry. Do not get it wet. Follow your health care provider's instructions on how to take a shower or bath while the port is accessed.  If your port does not need to stay accessed, no bandage is needed over the port.  What is flushing? Flushing helps keep the port from getting clogged. Follow your health care provider's instructions on how and when to flush the port. Ports are usually flushed with saline solution or a medicine called heparin. The need for flushing will depend on how the port is used.  If the port is used for intermittent medicines or blood draws, the port will need to be flushed: ? After medicines have been given. ? After blood has been drawn. ? As part of routine maintenance.  If a constant infusion is running, the port may not need to be flushed.  How long will my port stay implanted? The port can stay in for as long as your health care provider thinks it is needed. When it is time for the port to come out, surgery will be   done to remove it. The procedure is similar to the one performed when the port was put in. When should I seek immediate medical care? When you have an implanted port, you should seek immediate medical care if:  You notice a bad smell coming from the incision site.  You have swelling, redness, or drainage at the incision site.  You have more swelling or pain at the port site or the surrounding area.  You have a fever that is not controlled with medicine.  This information is not intended to replace advice given to you by your health care provider. Make sure you discuss any questions you have with your health care provider. Document  Released: 05/01/2005 Document Revised: 10/07/2015 Document Reviewed: 01/06/2013 Elsevier Interactive Patient Education  2017 [employer]  

## 2017-03-28 NOTE — Progress Notes (Signed)
[place]  [phone] [phone]) [phone]  Clinic Follow Up Note  Patient Care Team: Curlene Labrum, MD as PCP - General (Family Medicine) 03/29/2017   CHIEF COMPLAINTS:  Follow up metastatic right colon cancer   Oncology History   Cancer Staging Metastatic colon cancer to liver [facility]) Staging form: Colon and Rectum, AJCC 8th Edition - Clinical stage from 06/01/2016: Stage IVA (cTX, cNX, pM1a) - Signed by Truitt Merle, MD on 07/04/2016 - Pathologic stage from 06/14/2016: Stage IVA (pT4b(m), pN2b, pM1a) - Signed by Truitt Merle, MD on 07/04/2016       Metastatic colon cancer to liver ([place])   04/2015 Procedure    Colonoscopy by Dr. Ladona Horns. It showed showed 2 sessile polyps ready between 3-5 mm in size located 20 cm (A, B) from the point of entry, polypectomy was performed. Pedunculated polyp was found in the ascending colon (C), polypectomy was performed, and additional polyp (D) was found 30 cm from the point of entry, removed      04/2015 Pathology Results    tubular adenoma (A and B), and well differentiated adenocarcinoma arising from tubulovillous adenoma (C) and well differentiated adenocarcinoma arising from severe dysplasia to intramucosal carcinoma within tubular adenoma.       04/2015 Initial Diagnosis    Metastatic colon cancer to liver ([place])      05/29/2016 Imaging    CT abdomen and pelvis with contrast showed an apple core like stricture in right colon just above the cecum, measuring 3.2 cm in lengths. This is highly suspicious for malignancy. Small lymph node a noticed he had adjacent mesentery, measuring 8 mm. There is a low-density lesion within the inferior aspect of the right hepatic lobe measuring 1.7 cm, suspicious for metastasis.       06/06/2016 Tumor Marker    CEA 9.99      06/08/2016 PET scan    IMPRESSION: Approximately 3 cm hypermetabolic mass in the ascending colon, consistent with primary colon carcinoma. This mass results in  colonic obstruction and small bowel dilatation. Additional areas of hypermetabolic wall thickening in the cecum may represent other sites of colon carcinoma or colitis. Mild hypermetabolic lymphadenopathy in right pericolonic region, porta hepatis, and aortocaval space, consistent with metastatic disease. Mild hypermetabolic mediastinal lymphadenopathy also seen, and thoracic lymph node metastases cannot be excluded. Solitary hypermetabolic focus in inferior right hepatic lobe, consistent with liver metastasis. Consider abdomen MRI without and with contrast for further evaluation.      06/14/2016 Surgery    Hand assisted right hemicolectomy and small bowel resection for colon cancer, liver biopsy, by Dr. Barry Dienes      06/14/2016 Pathology Results    Right hemicolectomy showed invasive well to moderately differentiated adenocarcinoma, 2 foci measuring 7.5 cm and 4.5 cm, tumor invades through full thickness of colon, to the seroma and involve the Small Bowel, Surgical Margins Were Negative, 24 Out Of 64 Lymph Nodes Were Positive, Extracapsular Extension Identified, Multiple Satellite Tumor Deposits Present, Liver Biopsy Showed Metastatic Adenocarcinoma.        06/14/2016 Miscellaneous    Tumor MMR normal, MSI stable       06/14/2016 Miscellaneous    Foundation one genomic testing showed K-ras G12 D mutation, APC and TP53 mutation. No BRAF and NRAS mutation. MSI-stable, tumor burden low.      07/06/2016 Tumor Marker    CEA 13.69      07/13/2016 - 01/18/2017 Chemotherapy    mFOLFOX, every 2 weeks, started on 07/14/2015, Avastin added  from cycle 3  Oxaliplatin dose to '60mg'$ /m2 due to side effects and some cytopenia on 09/21/16  Changed to FOLFIRINOX starting cycle 7 and Reduced  Due to neuropathy hold Oxaliplatin and add Irinotecan with neulasta on day 3 starting with cycle 7 Add low dose Oxaliplatin with cycle 8.  Due to his worsening neuropathy, and good response to chemotherapy, I previously  stopped oxaliplatin from cycle 11, and continue FOLFIRI and avastin         07/27/2016 Tumor Marker    CEA 15.85      09/04/2016 Imaging    Ct C/A/P W Contrast IMPRESSION: Interval right colectomy. Stable small liver metastasis in the inferior right hepatic lobe. Stable mild porta hepatis and aortocaval lymphadenopathy. Stable mild mediastinal lymphadenopathy. No new or progressive metastatic disease identified within the chest, abdomen, or pelvis.      12/19/2016 PET scan    IMPRESSION: 1. Right hemicolectomy, with resolution of the prior hypermetabolic activity inferiorly in the right hepatic lobe, in several mediastinal lymph nodes, and in lymph nodes in the retroperitoneum and porta hepatis. No residual hypermetabolic or enlarged lymph nodes are identified. 2. Low-grade diffuse skeletal metabolic activity is likely therapy related. 3. Coronary atherosclerosis. 4. 3 by 4 mm right middle lobe pulmonary nodule is stable, not appreciably hypermetabolic, but below sensitive PET-CT size thresholds. This may warrant surveillance.      01/11/2017 Imaging    MR Abdomen W WO Contrast IMPRESSION: 1. No acute findings within the abdomen. Previously noted liver metastasis has resolved in the interval. No new lesions.      02/01/2017 -  Chemotherapy    Maintenance therapy, Xeloda '2000mg'$  (1000 mg/m2)  q12h on day 1-14 every 21 days plus AVASTIN, starting 02/01/2017.  stopped after 12 days due to poor tolerance on 02/14/17  Changed to maintenance 5-FU and avastin every 2 weeks starting on 02/22/17         HISTORY OF PRESENTING ILLNESS (06/01/2016):  Nicholas Lowery 56 y.o. male is here because of his recently abdominal CT which is highly suspicious for metastatic colon cancer. He is accompanied by his wife to my clinic today. He was referred by his primary care physician Dr. Pleas Koch.   He had colonoscopy in 2016 for mild rectal bleeding, he describe small amount fresh blood mixed with  stool, he has no other constitutional symptoms at that time. He was referred to gastroenterologist Dr. Ladona Horns and underwent a colonoscopy in December 2016. The colonoscopy showed 2 sessile polyps ready between 3-5 mm in size located 20 cm (A, B) from the point of entry, polypectomy was performed. Pedunculated polyp was found in the ascending colon (C), polypectomy was performed, and additional polyp (D) was found 30 cm from the point of entry, removed. The pathology reviewed tubular adenoma (A and B), and well differentiated adenocarcinoma arising from tubulovillous adenoma (C) and well differentiated adenocarcinoma arising from severe dysplasia to intramucosal carcinoma within tubular adenoma. Dr. Tye Maryland tried multiple times to reach patient, but patient sought they were calling him about the bill, and did not return the phone calls. He was not aware the cancer diagnosis until recently.   He started having diarrhea and vomiting in mid Dec 2017, and felt a "pop" in right side abdomen, he was seen at urgent care, and was treated with antiemetics, and IVF, lab tests were OK. Due to his persistent intermittent diarrhea and epigastric pain since then, he was seen by PCP and he eventually had CT abdomen and pelvis scan which  showed a upper core lesion in the ascending colon, and I'll 1.7 cm lesion in the liver, highly suspicious for metastasis. He was referred to [place] for further evaluation.  He has lost 30 lbs in the past one month, has low appetite, eats a small meals 1-2 times a day. He has moderate fatigue, able to tolerate routine activities including his work, but feels exhausted at the end of study. He has occasional constipation, denies recent rectal bleeding.  CURRENT THERAPY: Maintenance 5-Fu and Avastin started 01/04/2017, he tried one cycle Xeloda to replace 5-fu, but could not tolerate. Added leucovorin and decreased 5-Fu 10% starting on 03/15/17.  INTERIM HISTORY: [name] returns today for follow up and  treatment. He presents to the clinic today noting he did well with chemo last time. He notes he still has neuropathy only in his finger tips and the front of his foot. The dexterity in his fingers is decreased but still functional. His mouth sores have much improved since adding leucovorin. He wonders when he could take a chemo break.     MEDICAL HISTORY:  Past Medical History:  Diagnosis Date  . Anxiety   . Cancer of ascending colon ([place])   . Seasonal allergies     SURGICAL HISTORY: Past Surgical History:  Procedure Laterality Date  . APPENDECTOMY  1992  . CATARACT EXTRACTION W/ INTRAOCULAR LENS IMPLANT Left 04/2016  . COLON SURGERY    . COLONOSCOPY W/ BIOPSIES AND POLYPECTOMY  04/2015  . INGUINAL HERNIA REPAIR Right 1982  . IR RADIOLOGIST EVAL & MGMT  01/16/2017  . KNEE ARTHROSCOPY W/ MENISCECTOMY Right 1998  . LAPAROSCOPIC RIGHT COLECTOMY N/A 06/14/2016   Procedure: LAPAROSCOPIC HAND ASSISTED HEMICOLECTOMY AND SMALL BOWEL RESECTION.;  Surgeon: [name], MD;  Location: [place];  Service: General;  Laterality: N/A;  . LIVER BIOPSY Right 06/14/2016   Procedure: LIVER BIOPSY;  Surgeon: [name], MD;  Location: [place];  Service: General;  Laterality: Right;  Right Inferior Liver  . PORTACATH PLACEMENT N/A 07/06/2016   Procedure: INSERTION PORT-A-CATH;  Surgeon: [name], MD;  Location: [place];  Service: General;  Laterality: N/A;  . VASECTOMY  2005    SOCIAL HISTORY: Social History   Socioeconomic History  . Marital status: Married    Spouse name: Not on file  . Number of children: Not on file  . Years of education: Not on file  . Highest education level: Not on file  Social Needs  . Financial resource strain: Not on file  . Food insecurity - worry: Not on file  . Food insecurity - inability: Not on file  . Transportation needs - medical: Not on file  . Transportation needs - non-medical: Not on file  Occupational History  . Not on file  Tobacco  Use  . Smoking status: Former Smoker    Years: 2.00    Types: Cigarettes    Last attempt to quit: 1990    Years since quitting: 28.8  . Smokeless tobacco: Never Used  Substance and Sexual Activity  . Alcohol use: Yes    Alcohol/week: 2.4 oz    Types: 4 Cans of beer per week    Comment: social, none since colon surgery  . Drug use: No    Comment: 06/15/2016 "nothing since college"  . Sexual activity: Yes  Other Topics Concern  . Not on file  Social History Narrative  . Not on file   He is married. They have 3 boys, 63-12 yo. He works  for a [employer], desk job.   FAMILY HISTORY: Family History  Problem Relation Age of Onset  . Cancer Mother        lung cancer  . Stroke Mother   . Hypertension Father   . CAD Father   . Cancer Maternal Grandfather        prostate cancer     ALLERGIES:  is allergic to penicillins and xeloda [capecitabine].  MEDICATIONS:  Current Outpatient Medications  Medication Sig Dispense Refill  . amLODipine (NORVASC) 10 MG tablet Take 1 tablet (10 mg total) by mouth daily. 30 tablet 2  . celecoxib (CELEBREX) 200 MG capsule Take 200 mg by mouth daily.  1  . FLUoxetine (PROZAC) 40 MG capsule Take 40 mg by mouth daily.    Marland Kitchen lidocaine-prilocaine (EMLA) cream Apply to affected area once 30 g 3  . lisinopril (PRINIVIL) 10 MG tablet Take 1 tablet (10 mg total) by mouth daily. 90 tablet 1  . LORazepam (ATIVAN) 0.5 MG tablet Take 1 tablet (0.5 mg total) by mouth every 8 (eight) hours as needed (for nausea). 30 tablet 0  . magic mouthwash w/lidocaine SOLN Take 5 mLs by mouth 3 (three) times daily as needed for mouth pain. 240 mL 0  . Multiple Vitamin (MULTIVITAMIN WITH MINERALS) TABS tablet Take 1 tablet by mouth daily.    . ondansetron (ZOFRAN ODT) 8 MG disintegrating tablet Take 1 tablet (8 mg total) by mouth every 8 (eight) hours as needed for nausea or vomiting. 30 tablet 2  . prochlorperazine (COMPAZINE) 10 MG tablet Take 1 tablet (10 mg total) by  mouth every 6 (six) hours as needed for nausea or vomiting. 30 tablet 2  . urea (CARMOL) 10 % cream Apply topically 3 (three) times daily. 71 g 1  . dexamethasone (DECADRON) 4 MG tablet For 5 days after chemo (begin day 2) (Patient not taking: Reported on 02/24/2017) 10 tablet 0  . diphenoxylate-atropine (LOMOTIL) 2.5-0.025 MG tablet Take 2 tablets by mouth 4 (four) times daily as needed for diarrhea or loose stools. (Patient not taking: Reported on 02/24/2017) 30 tablet 0  . traMADol (ULTRAM) 50 MG tablet Take 1-2 tablets (50-100 mg total) by mouth every 6 (six) hours as needed for moderate pain or severe pain. (Patient not taking: Reported on 02/24/2017) 30 tablet 0   No current facility-administered medications for this visit.    Facility-Administered Medications Ordered in Other Visits  Medication Dose Route Frequency Provider Last Rate Last Dose  . bevacizumab (AVASTIN) 450 mg in sodium chloride 0.9 % 100 mL chemo infusion  5 mg/kg (Treatment Plan Recorded) Intravenous Once Truitt Merle, MD      . clindamycin (CLEOCIN) 900 mg in dextrose 5 % 50 mL IVPB  900 mg Intravenous 60 min Pre-Op [name], MD       And  . gentamicin (GARAMYCIN) 450 mg in dextrose 5 % 50 mL IVPB  5 mg/kg Intravenous 60 min Pre-Op [name], MD      . fluorouracil (ADRUCIL) 4,600 mg in sodium chloride 0.9 % 58 mL chemo infusion  2,200 mg/m2 (Treatment Plan Recorded) Intravenous 1 day or 1 dose Truitt Merle, MD      . heparin lock flush 100 unit/mL  500 Units Intracatheter Once PRN Truitt Merle, MD      . heparin lock flush 100 unit/mL  500 Units Intracatheter Once PRN Truitt Merle, MD      . leucovorin 840 mg in dextrose 5 % 250 mL infusion  400 mg/m2 (Treatment Plan Recorded) Intravenous Once Truitt Merle, MD      . sodium chloride flush (NS) 0.9 % injection 10 mL  10 mL Intracatheter PRN Truitt Merle, MD      . sodium chloride flush (NS) 0.9 % injection 10 mL  10 mL Intracatheter PRN Truitt Merle, MD         REVIEW OF SYSTEMS:    Constitutional: Denies fevers, chills or abnormal night sweats  Eyes: Denies blurriness of vision, double vision or watery eyes Ears, nose, mouth, throat, and face: (+) mouth sores, healing  Respiratory:negative  Cardiovascular: Denies palpitation, chest discomfort or lower extremity swelling Gastrointestinal:  Denies heartburn or change in bowel habits Skin:normal Lymphatics: Denies new lymphadenopathy or easy bruising Neurological: (+) residual numbness/tingling to hands MSK: Muscle weakness in hands Behavioral/Psych: Mood is stable, no new changes  All other systems were reviewed with the patient and are negative.  PHYSICAL EXAMINATION:  ECOG PERFORMANCE STATUS: 1 - Symptomatic but completely ambulatory  Vitals:   03/29/17 0914  BP: 123/79  Pulse: 73  Resp: 18  Temp: 98.4 F (36.9 C)  TempSrc: Oral  SpO2: 100%  Weight: 206 lb 9.6 oz (93.7 kg)  Height: '5\' 11"'$  (1.803 m)   . GENERAL:alert, no distress and comfortable. SKIN: skin color, texture, turgor are normal, no rashes or significant lesions EYES: normal, conjunctiva are pink and non-injected, sclera clear OROPHARYNX:no exudate, no erythema and lips, buccal mucosa, and tongue normal   (+) mouth sores, healing well  NECK: supple, thyroid normal size, non-tender, without nodularity LYMPH:  no palpable lymphadenopathy in the cervical, axillary or inguinal LUNGS: clear to auscultation and percussion with normal breathing effort HEART: regular rate & rhythm and no murmurs and no lower extremity edema ABDOMEN:abdomen soft, Surgical scar in the midline around the umbilical has healed well. non-tender and normal bowel sounds Musculoskeletal:no cyanosis of digits and no clubbing  PSYCH: alert & oriented x 3 with fluent speech NEURO: no focal motor/sensory deficits (+) mildly decreased bilateral vibration sensation in feet  LABORATORY DATA:  I have reviewed the data as listed CBC Latest Ref Rng & Units 03/29/2017 03/15/2017  02/24/2017  WBC 4.0 - 10.3 10e3/uL 4.1 4.3 6.3  Hemoglobin 13.0 - 17.1 g/dL 12.2(L) 12.1(L) 12.8(L)  Hematocrit 38.4 - 49.9 % 37.7(L) 37.0(L) 38.5(L)  Platelets 140 - 400 10e3/uL 138(L) 164 166   CMP Latest Ref Rng & Units 03/29/2017 03/15/2017 02/24/2017  Glucose 70 - 140 mg/dl 104 109 87  BUN 7.0 - 26.0 mg/dL 17.7 19.3 18  Creatinine 0.7 - 1.3 mg/dL 1.1 1.1 1.26(H)  Sodium 136 - 145 mEq/L 141 139 137  Potassium 3.5 - 5.1 mEq/L 4.5 4.7 4.0  Chloride 101 - 111 mmol/L - - 102  CO2 22 - 29 mEq/L '24 24 25  '$ Calcium 8.4 - 10.4 mg/dL 9.4 9.5 8.9  Total Protein 6.4 - 8.3 g/dL 6.9 7.0 7.1  Total Bilirubin 0.20 - 1.20 mg/dL 0.49 0.43 1.1  Alkaline Phos 40 - 150 U/L 49 66 57  AST 5 - 34 U/L '20 18 25  '$ ALT 0 - 55 U/L '16 17 18   '$ PATHOLOGY   Diagnosis 06/14/2016 1. Colon, segmental resection for tumor, Right Ascending Hemicolectomy and Small Bowel - INVASIVE WELL TO MODERATELY DIFFERENTIATED ADENOCARCINOMA. - TWO TUMOR FOCI MEASURING 7.5 CM AND 4.5 CM IN GREATEST DIMENSION. - TUMOR INVADES THROUGH FULL THICKNESS OF COLON, THROUGH THE SEROSA TO INVOLVE THE SMALL BOWEL. - MARGINS ARE NEGATIVE. - TWENTY FOUR  OF SIXTY FOUR LYMPH NODES POSITIVE FOR METASTATIC ADENOCARCINOMA (24/64). - EXTRACAPSULAR EXTENSION IDENTIFIED - MULTIPLE SATELLITE TUMOR DEPOSITS PRESENT. - SEE ONCOLOGY TEMPLATE. 2. Liver, needle/core biopsy, Right Inferior - POSITIVE FOR METASTATIC ADENOCARCINOMA.    RADIOGRAPHIC STUDIES: I have personally reviewed his outside CT scan from 05/29/2016 and agreed with the findings in the report. No results found. MR Abdomen W WO Contrast 01/11/2017 IMPRESSION: 1. No acute findings within the abdomen. Previously noted liver metastasis has resolved in the interval. No new lesions.  CT CAP 09/04/16 IMPRESSION: Interval right colectomy. Stable small liver metastasis in the inferior right hepatic lobe. Stable mild porta hepatis and aortocaval lymphadenopathy. Stable mild mediastinal  lymphadenopathy. No new or progressive metastatic disease identified within the chest, abdomen, or pelvis.  ASSESSMENT & PLAN:  56 y.o. Caucasian male, without significant past medical history, presented with rectal bleeding in December 2016, colonoscopy showed 4 polyps, 2 of them showed invasive adenocarcinoma, unfortunately he was not aware of the pathology findings and was not treated. He now presented with fatigue, weight loss, anorexia and abdominal pain  1. Right colon cancer with liver and node metastasis, pT4bN2bM1a, stage IV, MSI-stable, KRAS mutation (+) -I previously reviewed her PET scan findings, which showed solitary liver metastasis, abdominal and possible thoracic node metastasis  -His liver biopsy confirmed metastasis -Due to the bowel obstruction, he underwent upfront hemicolectomy. I previously reviewed his surgical pathology findings, which showed 2 primary right colon cancer, very locally advanced with 24 lymph nodes positive, surgical margins were negative. -I previously discussed his Foundation one genomic testing results, which showed care arrest mutation, and as high stable, low tumor burden. So he would not benefit from EGFR inhibitor, or immunotherapy alone. -The was started on palliative first-line chemotherapy FOLFOX. Avastin was added from cycle 3 -He has solitary liver metastasis, but multiple mild hypermetabolic adenopathy in mediastinum and abdomen. We previously discussed the possible local therapy options, such as liver metastectomy or ablation if he has great response to chemo and his node metastasis resolves  -restaging CT from 09/04/2016 reviewed with pt, stable disease. Will continue chemo -he developed side effects from FOLFOX, and a worsening thrombocytopenia, despite reduced dose of oxaliplatin  -Due to his overall limited response to FOLFOX, and his wish to pursue surgery, I previously recommend him to change chemo to FOLFIRINOX with low dose  oxaliplatin,  hopefully he will response better.  -Due to his severe nausea and vomiting, anorexia, mild diarrhea, I'll reduce his irinotecan dose from cycle 10  - PET scan from 12/19/2016 reviewed in person. He had near complete metabolic response from chemotherapy, post his liver metastasis and hypermetabolic lymph nodes are negative on PET scan with decreased size.  -Due to his worsening neuropathy, and good response to chemotherapy, I previously stopped oxaliplatin from cycle 11, and continue FOLFIRI and avastin   - I previously presented his case to the GI Tumor Board and discuss liver targeted therapy vs resection. Given lymph node metastasis, I favored liver ablation by interventional radiology.  -Reviewed his abdominal MRI findings, which showed no visible lesions in the liver. He has had a complete radiographic response -He was seen by interventional radiologist Dr. Earleen Newport, who does not think he need liver ablation since the solid liver met has resolved after chemo. I agree with his plan.  -He was recently seen by surgeon Dr. Barry Dienes, who recommend maintenance chemotherapy for now, no indication for resection at this point given -Given his excellent response to chemotherapy, and the poor tolerance  to intravenous chemotherapy, I recommend him to change treatments to maintenance therapy -He started Xeloda and Avastin maintenance therapy 02/01/17, due to very poor tolerance he stopped Xeloda 02/14/17 -I suggest he returns to 5-Fu with avastin for maintenance therapy every 2 weeks since he tolerated this much better. I recommend he continue for at least 3 more months. I also discussed the option of weekly 5-Fu bolus with leucovorin infusion. He agreed to proceed with 5-Fu pump and avastin (02/22/17). -He has improving mucositis but developed significant skin toxicity to his feet.  -I recommended adding leucovorin and decreasing 5FU dose 10% to decrease further skin toxicity starting 03/15/17.  -He plans to have  cataract surgery 05/01/17 and will likely postpone chemo that week.  -Repeat scan 04/10/2017. -Patient would like to come off chemo if possible.  I suggest another 3-4 months maintenance therapy and repeat a scan before discussing stopping chemo. He agreed.   -Labs reviewed, Mild anemia related to chemo and PLTs lower at 138K. White blood counts are normal. Labs adequate to proceed with treatment today. -F/u with Lacie on 11/29 and with me on 12/20  2. Hypertension -His blood pressure has been high lately, I have previously given him clonidine 0.1 mg in the infusion room before Avastin -We previously discussed Avastin can cause hypertension -I have started him on amlodipine, and increase dose to 10 mg daily -He does not want to follow-up with his primary care physician. -Previously, his blood pressure was noted to be elevated, especially diastolic. I previously added on lisinopril 10 mg daily for him -Patient blood pressure noted to be 128/79 on 01/18/2017. -Continue Norvasc and Lisinopril for now while on Avastin -Will continue to monitor -continue Amlodipine '10mg'$  daily, BP is better controlled -refilled his Lisinopril today (02/22/17), I advised him to find a new PCP.   3. Anemia of iron deficiency and chemo  -He has mild anemia, likely related to his colon cancer bleeding. -I previously recommend him to take oral iron supplement over-the-counter, potential side effects of constipation and gastric or discomfort or discussed with him. He stopped taking this before surgery. -I previously advised him to begin taking oral iron supplements again. - Iron levels previously on 08/24/16 at 46. -Iron levels previously on 01/04/17 at 92. -03/15/17 iron study normal, Hg 12.1 -Hg stable at 12.2 today (03/29/17)   4. Anorexia and weight loss -Secondary to underlying malignancy -I previously encouraged him to try nutritional supplement, such as boost or initial and try to eat more -The patient has lost  some weight recently -weight gain lately.   5. Peripheral neuropathy, G1 -Secondary to oxaliplatin, dose reduced and ultimately discontinued -Slightly worse with FOLFIRI, held Irinotecan beginning 01/04/17 -Improved some lately, his hand function is normal, no balance issue, discussed with patient this may take some time to resolve.  - I strongly encouraged him to take vitamin B-complex supplements -Will continue to monitor.  6. Goal of care discussion  -We previously discussed the incurable nature of his cancer, and the overall poor prognosis, especially if he does not have good response to chemotherapy or progress on chemo -The patient understands the goal of care is palliative.  -He is full code now     Plan  -Labs reviewed and adequate for treatment today  -CT scan 04/10/17 -lab, flush, f/u with Lacie and 5-fu pump plus avastin and leucovorin on 11/29 -lab, flush, f/u with me and 5-fu pump plus avastin and leucovorin on 12/20    No orders of the  defined types were placed in this encounter.   All questions were answered.   The patient knows to call the clinic with any problems, questions or concerns.  I spent 25 minutes counseling the patient face to face. The total time spent in the appointment was 30 minutes and more than 50% was on counseling.  This document serves as a record of services personally performed by Truitt Merle, MD. It was created on her behalf by Joslyn Devon, a trained medical scribe. The creation of this record is based on the scribe's personal observations and the provider's statements to them.    I have reviewed the above documentation for accuracy and completeness, and I agree with the above.   Truitt Merle  03/29/2017

## 2017-03-29 ENCOUNTER — Ambulatory Visit (HOSPITAL_BASED_OUTPATIENT_CLINIC_OR_DEPARTMENT_OTHER): Payer: BLUE CROSS/BLUE SHIELD

## 2017-03-29 ENCOUNTER — Other Ambulatory Visit (HOSPITAL_BASED_OUTPATIENT_CLINIC_OR_DEPARTMENT_OTHER): Payer: BLUE CROSS/BLUE SHIELD

## 2017-03-29 ENCOUNTER — Encounter: Payer: Self-pay | Admitting: Hematology

## 2017-03-29 ENCOUNTER — Ambulatory Visit (HOSPITAL_BASED_OUTPATIENT_CLINIC_OR_DEPARTMENT_OTHER): Payer: BLUE CROSS/BLUE SHIELD | Admitting: Hematology

## 2017-03-29 VITALS — BP 123/79 | HR 73 | Temp 98.4°F | Resp 18 | Ht 71.0 in | Wt 206.6 lb

## 2017-03-29 VITALS — BP 119/69 | HR 60

## 2017-03-29 DIAGNOSIS — I10 Essential (primary) hypertension: Secondary | ICD-10-CM | POA: Diagnosis not present

## 2017-03-29 DIAGNOSIS — C189 Malignant neoplasm of colon, unspecified: Secondary | ICD-10-CM

## 2017-03-29 DIAGNOSIS — Z5111 Encounter for antineoplastic chemotherapy: Secondary | ICD-10-CM | POA: Diagnosis not present

## 2017-03-29 DIAGNOSIS — C787 Secondary malignant neoplasm of liver and intrahepatic bile duct: Principal | ICD-10-CM

## 2017-03-29 DIAGNOSIS — D6481 Anemia due to antineoplastic chemotherapy: Secondary | ICD-10-CM

## 2017-03-29 DIAGNOSIS — Z5112 Encounter for antineoplastic immunotherapy: Secondary | ICD-10-CM

## 2017-03-29 DIAGNOSIS — D500 Iron deficiency anemia secondary to blood loss (chronic): Secondary | ICD-10-CM

## 2017-03-29 DIAGNOSIS — G620 Drug-induced polyneuropathy: Secondary | ICD-10-CM

## 2017-03-29 DIAGNOSIS — C182 Malignant neoplasm of ascending colon: Secondary | ICD-10-CM

## 2017-03-29 DIAGNOSIS — Z95828 Presence of other vascular implants and grafts: Secondary | ICD-10-CM

## 2017-03-29 DIAGNOSIS — R634 Abnormal weight loss: Secondary | ICD-10-CM | POA: Diagnosis not present

## 2017-03-29 DIAGNOSIS — Z452 Encounter for adjustment and management of vascular access device: Secondary | ICD-10-CM | POA: Diagnosis not present

## 2017-03-29 DIAGNOSIS — R630 Anorexia: Secondary | ICD-10-CM

## 2017-03-29 LAB — CBC WITH DIFFERENTIAL/PLATELET
BASO%: 0.5 % (ref 0.0–2.0)
Basophils Absolute: 0 10*3/uL (ref 0.0–0.1)
EOS ABS: 0.1 10*3/uL (ref 0.0–0.5)
EOS%: 2.2 % (ref 0.0–7.0)
HCT: 37.7 % — ABNORMAL LOW (ref 38.4–49.9)
HEMOGLOBIN: 12.2 g/dL — AB (ref 13.0–17.1)
LYMPH%: 15.3 % (ref 14.0–49.0)
MCH: 32.8 pg (ref 27.2–33.4)
MCHC: 32.4 g/dL (ref 32.0–36.0)
MCV: 101.3 fL — AB (ref 79.3–98.0)
MONO#: 0.6 10*3/uL (ref 0.1–0.9)
MONO%: 13.6 % (ref 0.0–14.0)
NEUT%: 68.4 % (ref 39.0–75.0)
NEUTROS ABS: 2.8 10*3/uL (ref 1.5–6.5)
PLATELETS: 138 10*3/uL — AB (ref 140–400)
RBC: 3.72 10*6/uL — AB (ref 4.20–5.82)
RDW: 15.8 % — ABNORMAL HIGH (ref 11.0–14.6)
WBC: 4.1 10*3/uL (ref 4.0–10.3)
lymph#: 0.6 10*3/uL — ABNORMAL LOW (ref 0.9–3.3)

## 2017-03-29 LAB — COMPREHENSIVE METABOLIC PANEL
ALT: 16 U/L (ref 0–55)
ANION GAP: 10 meq/L (ref 3–11)
AST: 20 U/L (ref 5–34)
Albumin: 3.8 g/dL (ref 3.5–5.0)
Alkaline Phosphatase: 49 U/L (ref 40–150)
BUN: 17.7 mg/dL (ref 7.0–26.0)
CALCIUM: 9.4 mg/dL (ref 8.4–10.4)
CO2: 24 meq/L (ref 22–29)
Chloride: 107 mEq/L (ref 98–109)
Creatinine: 1.1 mg/dL (ref 0.7–1.3)
Glucose: 104 mg/dl (ref 70–140)
Potassium: 4.5 mEq/L (ref 3.5–5.1)
Sodium: 141 mEq/L (ref 136–145)
TOTAL PROTEIN: 6.9 g/dL (ref 6.4–8.3)
Total Bilirubin: 0.49 mg/dL (ref 0.20–1.20)

## 2017-03-29 MED ORDER — SODIUM CHLORIDE 0.9 % IV SOLN
5.0000 mg/kg | Freq: Once | INTRAVENOUS | Status: AC
  Administered 2017-03-29: 450 mg via INTRAVENOUS
  Filled 2017-03-29: qty 2

## 2017-03-29 MED ORDER — DEXAMETHASONE SODIUM PHOSPHATE 10 MG/ML IJ SOLN
INTRAMUSCULAR | Status: AC
  Filled 2017-03-29: qty 1

## 2017-03-29 MED ORDER — DEXAMETHASONE SODIUM PHOSPHATE 10 MG/ML IJ SOLN
8.0000 mg | Freq: Once | INTRAMUSCULAR | Status: AC
  Administered 2017-03-29: 8 mg via INTRAVENOUS

## 2017-03-29 MED ORDER — HEPARIN SOD (PORK) LOCK FLUSH 100 UNIT/ML IV SOLN
500.0000 [IU] | Freq: Once | INTRAVENOUS | Status: DC | PRN
  Filled 2017-03-29: qty 5

## 2017-03-29 MED ORDER — SODIUM CHLORIDE 0.9 % IV SOLN
Freq: Once | INTRAVENOUS | Status: AC
Start: 2017-03-29 — End: 2017-03-29
  Administered 2017-03-29: 10:00:00 via INTRAVENOUS

## 2017-03-29 MED ORDER — SODIUM CHLORIDE 0.9% FLUSH
10.0000 mL | INTRAVENOUS | Status: DC | PRN
  Filled 2017-03-29: qty 10

## 2017-03-29 MED ORDER — SODIUM CHLORIDE 0.9 % IV SOLN
2200.0000 mg/m2 | INTRAVENOUS | Status: DC
  Administered 2017-03-29: 4600 mg via INTRAVENOUS
  Filled 2017-03-29: qty 92

## 2017-03-29 MED ORDER — PALONOSETRON HCL INJECTION 0.25 MG/5ML
0.2500 mg | Freq: Once | INTRAVENOUS | Status: AC
  Administered 2017-03-29: 0.25 mg via INTRAVENOUS

## 2017-03-29 MED ORDER — LEUCOVORIN CALCIUM INJECTION 350 MG
400.0000 mg/m2 | Freq: Once | INTRAVENOUS | Status: AC
  Administered 2017-03-29: 840 mg via INTRAVENOUS
  Filled 2017-03-29: qty 42

## 2017-03-29 MED ORDER — PALONOSETRON HCL INJECTION 0.25 MG/5ML
INTRAVENOUS | Status: AC
  Filled 2017-03-29: qty 5

## 2017-03-29 MED ORDER — SODIUM CHLORIDE 0.9% FLUSH
10.0000 mL | Freq: Once | INTRAVENOUS | Status: AC
  Administered 2017-03-29: 10 mL
  Filled 2017-03-29: qty 10

## 2017-03-29 NOTE — Patient Instructions (Signed)
[facility] Discharge Instructions for Patients Receiving Chemotherapy  Today you received the following chemotherapy agents Leucovorin, Avastin, Adrucil  To help prevent nausea and vomiting after your treatment, we encourage you to take your nausea medication as directed   If you develop nausea and vomiting that is not controlled by your nausea medication, call the clinic.   BELOW ARE SYMPTOMS THAT SHOULD BE REPORTED IMMEDIATELY:  *FEVER GREATER THAN 100.5 F  *CHILLS WITH OR WITHOUT FEVER  NAUSEA AND VOMITING THAT IS NOT CONTROLLED WITH YOUR NAUSEA MEDICATION  *UNUSUAL SHORTNESS OF BREATH  *UNUSUAL BRUISING OR BLEEDING  TENDERNESS IN MOUTH AND THROAT WITH OR WITHOUT PRESENCE OF ULCERS  *URINARY PROBLEMS  *BOWEL PROBLEMS  UNUSUAL RASH Items with * indicate a potential emergency and should be followed up as soon as possible.  Feel free to call the clinic should you have any questions or concerns. The clinic phone number is [phone].  Please show the [place] at check-in to the Emergency Department and triage nurse.

## 2017-03-31 ENCOUNTER — Ambulatory Visit (HOSPITAL_BASED_OUTPATIENT_CLINIC_OR_DEPARTMENT_OTHER): Payer: BLUE CROSS/BLUE SHIELD

## 2017-03-31 VITALS — BP 131/93 | HR 78 | Temp 98.2°F | Resp 18

## 2017-03-31 DIAGNOSIS — C189 Malignant neoplasm of colon, unspecified: Secondary | ICD-10-CM

## 2017-03-31 DIAGNOSIS — C182 Malignant neoplasm of ascending colon: Secondary | ICD-10-CM | POA: Diagnosis not present

## 2017-03-31 DIAGNOSIS — C787 Secondary malignant neoplasm of liver and intrahepatic bile duct: Principal | ICD-10-CM

## 2017-03-31 MED ORDER — HEPARIN SOD (PORK) LOCK FLUSH 100 UNIT/ML IV SOLN
500.0000 [IU] | Freq: Once | INTRAVENOUS | Status: AC | PRN
Start: 2017-03-31 — End: 2017-03-31
  Administered 2017-03-31: 500 [IU]
  Filled 2017-03-31: qty 5

## 2017-03-31 MED ORDER — SODIUM CHLORIDE 0.9% FLUSH
10.0000 mL | INTRAVENOUS | Status: DC | PRN
  Administered 2017-03-31: 10 mL
  Filled 2017-03-31: qty 10

## 2017-04-10 ENCOUNTER — Ambulatory Visit (HOSPITAL_COMMUNITY)
Admission: RE | Admit: 2017-04-10 | Discharge: 2017-04-10 | Disposition: A | Discharge status: Home / Self Care | Payer: BLUE CROSS/BLUE SHIELD | Source: Ambulatory Visit | Attending: Nurse Practitioner | Admitting: Nurse Practitioner

## 2017-04-10 ENCOUNTER — Encounter (HOSPITAL_COMMUNITY): Payer: Self-pay

## 2017-04-10 DIAGNOSIS — C189 Malignant neoplasm of colon, unspecified: Secondary | ICD-10-CM | POA: Diagnosis present

## 2017-04-10 DIAGNOSIS — C787 Secondary malignant neoplasm of liver and intrahepatic bile duct: Secondary | ICD-10-CM | POA: Diagnosis present

## 2017-04-10 MED ORDER — IOPAMIDOL (ISOVUE-300) INJECTION 61%
INTRAVENOUS | Status: AC
  Administered 2017-04-10: 100 mL via INTRAVENOUS
  Filled 2017-04-10: qty 100

## 2017-04-10 MED ORDER — IOPAMIDOL (ISOVUE-300) INJECTION 61%
100.0000 mL | Freq: Once | INTRAVENOUS | Status: AC | PRN
  Administered 2017-04-10: 100 mL via INTRAVENOUS

## 2017-04-12 ENCOUNTER — Telehealth: Payer: Self-pay | Admitting: Nurse Practitioner

## 2017-04-12 ENCOUNTER — Encounter: Payer: Self-pay | Admitting: Nurse Practitioner

## 2017-04-12 ENCOUNTER — Other Ambulatory Visit (HOSPITAL_BASED_OUTPATIENT_CLINIC_OR_DEPARTMENT_OTHER): Payer: BLUE CROSS/BLUE SHIELD

## 2017-04-12 ENCOUNTER — Ambulatory Visit (HOSPITAL_BASED_OUTPATIENT_CLINIC_OR_DEPARTMENT_OTHER): Payer: BLUE CROSS/BLUE SHIELD

## 2017-04-12 ENCOUNTER — Ambulatory Visit (HOSPITAL_BASED_OUTPATIENT_CLINIC_OR_DEPARTMENT_OTHER): Payer: BLUE CROSS/BLUE SHIELD | Admitting: Nurse Practitioner

## 2017-04-12 VITALS — BP 134/89 | HR 61 | Temp 98.7°F | Resp 18

## 2017-04-12 VITALS — BP 137/85 | HR 65 | Temp 97.9°F | Resp 18 | Ht 71.0 in | Wt 204.4 lb

## 2017-04-12 DIAGNOSIS — C182 Malignant neoplasm of ascending colon: Secondary | ICD-10-CM

## 2017-04-12 DIAGNOSIS — G620 Drug-induced polyneuropathy: Secondary | ICD-10-CM

## 2017-04-12 DIAGNOSIS — G629 Polyneuropathy, unspecified: Secondary | ICD-10-CM

## 2017-04-12 DIAGNOSIS — C787 Secondary malignant neoplasm of liver and intrahepatic bile duct: Secondary | ICD-10-CM

## 2017-04-12 DIAGNOSIS — C189 Malignant neoplasm of colon, unspecified: Secondary | ICD-10-CM

## 2017-04-12 DIAGNOSIS — Z5111 Encounter for antineoplastic chemotherapy: Secondary | ICD-10-CM

## 2017-04-12 DIAGNOSIS — Z5112 Encounter for antineoplastic immunotherapy: Secondary | ICD-10-CM

## 2017-04-12 DIAGNOSIS — D500 Iron deficiency anemia secondary to blood loss (chronic): Secondary | ICD-10-CM

## 2017-04-12 DIAGNOSIS — D509 Iron deficiency anemia, unspecified: Secondary | ICD-10-CM

## 2017-04-12 DIAGNOSIS — I10 Essential (primary) hypertension: Secondary | ICD-10-CM

## 2017-04-12 DIAGNOSIS — D696 Thrombocytopenia, unspecified: Secondary | ICD-10-CM

## 2017-04-12 DIAGNOSIS — Z452 Encounter for adjustment and management of vascular access device: Secondary | ICD-10-CM | POA: Diagnosis not present

## 2017-04-12 DIAGNOSIS — T451X5A Adverse effect of antineoplastic and immunosuppressive drugs, initial encounter: Secondary | ICD-10-CM

## 2017-04-12 DIAGNOSIS — Z95828 Presence of other vascular implants and grafts: Secondary | ICD-10-CM

## 2017-04-12 LAB — COMPREHENSIVE METABOLIC PANEL
ALT: 15 U/L (ref 0–55)
ANION GAP: 8 meq/L (ref 3–11)
AST: 17 U/L (ref 5–34)
Albumin: 4.2 g/dL (ref 3.5–5.0)
Alkaline Phosphatase: 51 U/L (ref 40–150)
BILIRUBIN TOTAL: 0.58 mg/dL (ref 0.20–1.20)
BUN: 20.7 mg/dL (ref 7.0–26.0)
CO2: 26 meq/L (ref 22–29)
CREATININE: 1.2 mg/dL (ref 0.7–1.3)
Calcium: 10 mg/dL (ref 8.4–10.4)
Chloride: 106 mEq/L (ref 98–109)
EGFR: >60 mL/min/{1.73_m2} (ref >60)
GLUCOSE: 83 mg/dL (ref 70–140)
Potassium: 4.8 mEq/L (ref 3.5–5.1)
SODIUM: 139 meq/L (ref 136–145)
TOTAL PROTEIN: 7.3 g/dL (ref 6.4–8.3)

## 2017-04-12 LAB — IRON AND TIBC
%SAT: 27 % (ref 20–55)
IRON: 115 ug/dL (ref 42–163)
TIBC: 418 ug/dL — ABNORMAL HIGH (ref 202–409)
UIBC: 303 ug/dL (ref 117–376)

## 2017-04-12 LAB — FERRITIN: FERRITIN: 37 ng/mL (ref 22–316)

## 2017-04-12 LAB — CBC WITH DIFFERENTIAL/PLATELET
BASO%: 0.6 % (ref 0.0–2.0)
Basophils Absolute: 0 10*3/uL (ref 0.0–0.1)
EOS ABS: 0.2 10*3/uL (ref 0.0–0.5)
EOS%: 3.1 % (ref 0.0–7.0)
HEMATOCRIT: 40.3 % (ref 38.4–49.9)
HEMOGLOBIN: 13.1 g/dL (ref 13.0–17.1)
LYMPH#: 0.8 10*3/uL — AB (ref 0.9–3.3)
LYMPH%: 15.7 % (ref 14.0–49.0)
MCH: 32.4 pg (ref 27.2–33.4)
MCHC: 32.5 g/dL (ref 32.0–36.0)
MCV: 99.8 fL — AB (ref 79.3–98.0)
MONO#: 0.6 10*3/uL (ref 0.1–0.9)
MONO%: 12.6 % (ref 0.0–14.0)
NEUT%: 68 % (ref 39.0–75.0)
NEUTROS ABS: 3.3 10*3/uL (ref 1.5–6.5)
PLATELETS: 113 10*3/uL — AB (ref 140–400)
RBC: 4.04 10*6/uL — AB (ref 4.20–5.82)
RDW: 15.5 % — ABNORMAL HIGH (ref 11.0–14.6)
WBC: 4.8 10*3/uL (ref 4.0–10.3)

## 2017-04-12 LAB — CEA (IN HOUSE-CHCC): CEA (CHCC-In House): 4.36 ng/mL (ref 0.00–5.00)

## 2017-04-12 LAB — UA PROTEIN, DIPSTICK - CHCC: Protein, ur: NEGATIVE mg/dL

## 2017-04-12 MED ORDER — DEXTROSE 5 % IV SOLN
Freq: Once | INTRAVENOUS | Status: AC
  Administered 2017-04-12: 11:00:00 via INTRAVENOUS

## 2017-04-12 MED ORDER — DEXAMETHASONE SODIUM PHOSPHATE 10 MG/ML IJ SOLN
INTRAMUSCULAR | Status: AC
  Filled 2017-04-12: qty 1

## 2017-04-12 MED ORDER — SODIUM CHLORIDE 0.9 % IV SOLN
5.0000 mg/kg | Freq: Once | INTRAVENOUS | Status: AC
  Administered 2017-04-12: 450 mg via INTRAVENOUS
  Filled 2017-04-12: qty 16

## 2017-04-12 MED ORDER — DEXAMETHASONE SODIUM PHOSPHATE 10 MG/ML IJ SOLN
8.0000 mg | Freq: Once | INTRAMUSCULAR | Status: AC
  Administered 2017-04-12: 8 mg via INTRAVENOUS

## 2017-04-12 MED ORDER — SODIUM CHLORIDE 0.9% FLUSH
10.0000 mL | Freq: Once | INTRAVENOUS | Status: AC
  Administered 2017-04-12: 10 mL
  Filled 2017-04-12: qty 10

## 2017-04-12 MED ORDER — LEUCOVORIN CALCIUM INJECTION 350 MG
400.0000 mg/m2 | Freq: Once | INTRAVENOUS | Status: AC
  Administered 2017-04-12: 840 mg via INTRAVENOUS
  Filled 2017-04-12: qty 42

## 2017-04-12 MED ORDER — PALONOSETRON HCL INJECTION 0.25 MG/5ML
0.2500 mg | Freq: Once | INTRAVENOUS | Status: AC
  Administered 2017-04-12: 0.25 mg via INTRAVENOUS

## 2017-04-12 MED ORDER — SODIUM CHLORIDE 0.9 % IV SOLN
2200.0000 mg/m2 | INTRAVENOUS | Status: DC
  Administered 2017-04-12: 4600 mg via INTRAVENOUS
  Filled 2017-04-12: qty 92

## 2017-04-12 MED ORDER — PALONOSETRON HCL INJECTION 0.25 MG/5ML
INTRAVENOUS | Status: AC
  Filled 2017-04-12: qty 5

## 2017-04-12 NOTE — Patient Instructions (Signed)
[facility] Discharge Instructions for Patients Receiving Chemotherapy  Today you received the following chemotherapy agents: Avastin, Leucovorin, and 5FU  To help prevent nausea and vomiting after your treatment, we encourage you to take your nausea medication as directed   If you develop nausea and vomiting that is not controlled by your nausea medication, call the clinic.   BELOW ARE SYMPTOMS THAT SHOULD BE REPORTED IMMEDIATELY:  *FEVER GREATER THAN 100.5 F  *CHILLS WITH OR WITHOUT FEVER  NAUSEA AND VOMITING THAT IS NOT CONTROLLED WITH YOUR NAUSEA MEDICATION  *UNUSUAL SHORTNESS OF BREATH  *UNUSUAL BRUISING OR BLEEDING  TENDERNESS IN MOUTH AND THROAT WITH OR WITHOUT PRESENCE OF ULCERS  *URINARY PROBLEMS  *BOWEL PROBLEMS  UNUSUAL RASH Items with * indicate a potential emergency and should be followed up as soon as possible.  Feel free to call the clinic should you have any questions or concerns. The clinic phone number is [phone].  Please show the CHEMO ALERT CARD at check-in to the Emergency Department and triage nurse.   

## 2017-04-12 NOTE — Telephone Encounter (Signed)
Scheduled appt per 11/29 los - patient did not want AVS or calender - my chart Active.

## 2017-04-12 NOTE — Patient Instructions (Signed)
Implanted Port Home Guide An implanted port is a type of central line that is placed under the skin. Central lines are used to provide IV access when treatment or nutrition needs to be given through a person's veins. Implanted ports are used for long-term IV access. An implanted port may be placed because:  You need IV medicine that would be irritating to the small veins in your hands or arms.  You need long-term IV medicines, such as antibiotics.  You need IV nutrition for a long period.  You need frequent blood draws for lab tests.  You need dialysis.  Implanted ports are usually placed in the chest area, but they can also be placed in the upper arm, the abdomen, or the leg. An implanted port has two main parts:  Reservoir. The reservoir is round and will appear as a small, raised area under your skin. The reservoir is the part where a needle is inserted to give medicines or draw blood.  Catheter. The catheter is a thin, flexible tube that extends from the reservoir. The catheter is placed into a large vein. Medicine that is inserted into the reservoir goes into the catheter and then into the vein.  How will I care for my incision site? Do not get the incision site wet. Bathe or shower as directed by your health care provider. How is my port accessed? Special steps must be taken to access the port:  Before the port is accessed, a numbing cream can be placed on the skin. This helps numb the skin over the port site.  Your health care provider uses a sterile technique to access the port. ? Your health care provider must put on a mask and sterile gloves. ? The skin over your port is cleaned carefully with an antiseptic and allowed to dry. ? The port is gently pinched between sterile gloves, and a needle is inserted into the port.  Only "non-coring" port needles should be used to access the port. Once the port is accessed, a blood return should be checked. This helps ensure that the port  is in the vein and is not clogged.  If your port needs to remain accessed for a constant infusion, a clear (transparent) bandage will be placed over the needle site. The bandage and needle will need to be changed every week, or as directed by your health care provider.  Keep the bandage covering the needle clean and dry. Do not get it wet. Follow your health care provider's instructions on how to take a shower or bath while the port is accessed.  If your port does not need to stay accessed, no bandage is needed over the port.  What is flushing? Flushing helps keep the port from getting clogged. Follow your health care provider's instructions on how and when to flush the port. Ports are usually flushed with saline solution or a medicine called heparin. The need for flushing will depend on how the port is used.  If the port is used for intermittent medicines or blood draws, the port will need to be flushed: ? After medicines have been given. ? After blood has been drawn. ? As part of routine maintenance.  If a constant infusion is running, the port may not need to be flushed.  How long will my port stay implanted? The port can stay in for as long as your health care provider thinks it is needed. When it is time for the port to come out, surgery will be   done to remove it. The procedure is similar to the one performed when the port was put in. When should I seek immediate medical care? When you have an implanted port, you should seek immediate medical care if:  You notice a bad smell coming from the incision site.  You have swelling, redness, or drainage at the incision site.  You have more swelling or pain at the port site or the surrounding area.  You have a fever that is not controlled with medicine.  This information is not intended to replace advice given to you by your health care provider. Make sure you discuss any questions you have with your health care provider. Document  Released: 05/01/2005 Document Revised: 10/07/2015 Document Reviewed: 01/06/2013 Elsevier Interactive Patient Education  2017 [employer]  

## 2017-04-12 NOTE — Progress Notes (Signed)
[facility]  [phone] [phone]  Clinic Follow up Note   Patient Care Team: Curlene Labrum, MD as PCP - General (Family Medicine) 04/12/2017  CHIEF COMPLAINT: F/u metastatic right colon cancer   SUMMARY OF ONCOLOGIC HISTORY: Oncology History   Cancer Staging Metastatic colon cancer to liver [facility]) Staging form: Colon and Rectum, AJCC 8th Edition - Clinical stage from 06/01/2016: Stage IVA (cTX, cNX, pM1a) - Signed by Truitt Merle, MD on 07/04/2016 - Pathologic stage from 06/14/2016: Stage IVA (pT4b(m), pN2b, pM1a) - Signed by Truitt Merle, MD on 07/04/2016       Metastatic colon cancer to liver ([place])   04/2015 Procedure    Colonoscopy by Dr. Ladona Horns. It showed showed 2 sessile polyps ready between 3-5 mm in size located 20 cm (A, B) from the point of entry, polypectomy was performed. Pedunculated polyp was found in the ascending colon (C), polypectomy was performed, and additional polyp (D) was found 30 cm from the point of entry, removed      04/2015 Pathology Results    tubular adenoma (A and B), and well differentiated adenocarcinoma arising from tubulovillous adenoma (C) and well differentiated adenocarcinoma arising from severe dysplasia to intramucosal carcinoma within tubular adenoma.       04/2015 Initial Diagnosis    Metastatic colon cancer to liver ([place])      05/29/2016 Imaging    CT abdomen and pelvis with contrast showed an apple core like stricture in right colon just above the cecum, measuring 3.2 cm in lengths. This is highly suspicious for malignancy. Small lymph node a noticed he had adjacent mesentery, measuring 8 mm. There is a low-density lesion within the inferior aspect of the right hepatic lobe measuring 1.7 cm, suspicious for metastasis.       06/06/2016 Tumor Marker    CEA 9.99      06/08/2016 PET scan    IMPRESSION: Approximately 3 cm hypermetabolic mass in the ascending colon, consistent with primary colon  carcinoma. This mass results in colonic obstruction and small bowel dilatation. Additional areas of hypermetabolic wall thickening in the cecum may represent other sites of colon carcinoma or colitis. Mild hypermetabolic lymphadenopathy in right pericolonic region, porta hepatis, and aortocaval space, consistent with metastatic disease. Mild hypermetabolic mediastinal lymphadenopathy also seen, and thoracic lymph node metastases cannot be excluded. Solitary hypermetabolic focus in inferior right hepatic lobe, consistent with liver metastasis. Consider abdomen MRI without and with contrast for further evaluation.      06/14/2016 Surgery    Hand assisted right hemicolectomy and small bowel resection for colon cancer, liver biopsy, by Dr. Barry Dienes      06/14/2016 Pathology Results    Right hemicolectomy showed invasive well to moderately differentiated adenocarcinoma, 2 foci measuring 7.5 cm and 4.5 cm, tumor invades through full thickness of colon, to the seroma and involve the Small Bowel, Surgical Margins Were Negative, 24 Out Of 64 Lymph Nodes Were Positive, Extracapsular Extension Identified, Multiple Satellite Tumor Deposits Present, Liver Biopsy Showed Metastatic Adenocarcinoma.        06/14/2016 Miscellaneous    Tumor MMR normal, MSI stable       06/14/2016 Miscellaneous    Foundation one genomic testing showed K-ras G12 D mutation, APC and TP53 mutation. No BRAF and NRAS mutation. MSI-stable, tumor burden low.      07/06/2016 Tumor Marker    CEA 13.69      07/13/2016 - 01/18/2017 Chemotherapy    mFOLFOX, every 2 weeks, started on 07/14/2015,  Avastin added from cycle 3  Oxaliplatin dose to '60mg'$ /m2 due to side effects and some cytopenia on 09/21/16  Changed to FOLFIRINOX starting cycle 7 and Reduced  Due to neuropathy hold Oxaliplatin and add Irinotecan with neulasta on day 3 starting with cycle 7 Add low dose Oxaliplatin with cycle 8.  Due to his worsening neuropathy, and good response to  chemotherapy, I previously stopped oxaliplatin from cycle 11, and continue FOLFIRI and avastin         07/27/2016 Tumor Marker    CEA 15.85      09/04/2016 Imaging    Ct C/A/P W Contrast IMPRESSION: Interval right colectomy. Stable small liver metastasis in the inferior right hepatic lobe. Stable mild porta hepatis and aortocaval lymphadenopathy. Stable mild mediastinal lymphadenopathy. No new or progressive metastatic disease identified within the chest, abdomen, or pelvis.      12/19/2016 PET scan    IMPRESSION: 1. Right hemicolectomy, with resolution of the prior hypermetabolic activity inferiorly in the right hepatic lobe, in several mediastinal lymph nodes, and in lymph nodes in the retroperitoneum and porta hepatis. No residual hypermetabolic or enlarged lymph nodes are identified. 2. Low-grade diffuse skeletal metabolic activity is likely therapy related. 3. Coronary atherosclerosis. 4. 3 by 4 mm right middle lobe pulmonary nodule is stable, not appreciably hypermetabolic, but below sensitive PET-CT size thresholds. This may warrant surveillance.      01/11/2017 Imaging    MR Abdomen W WO Contrast IMPRESSION: 1. No acute findings within the abdomen. Previously noted liver metastasis has resolved in the interval. No new lesions.      02/01/2017 -  Chemotherapy    Maintenance therapy, Xeloda '2000mg'$  (1000 mg/m2)  q12h on day 1-14 every 21 days plus AVASTIN, starting 02/01/2017.  stopped after 12 days due to poor tolerance on 02/14/17  Changed to maintenance 5-FU and avastin every 2 weeks starting on 02/22/17        04/10/2017 Imaging    IMPRESSION: 1. Status post right hemicolectomy without findings for recurrent tumor. 2. No worrisome hepatic lesions. Treated disease with only a small residual low attenuation lesion in the right hepatic lobe. 3. No recurrent mediastinal or abdominal lymphadenopathy.      CURRENT THERAPY: Maintenance 5-Fu and Avastin started  01/04/2017, he tried one cycle Xeloda to replace 5-fu, but could not tolerate. Added leucovorin and decreased 5-Fu 10% starting on 03/15/17.  INTERVAL HISTORY: Mr. Geter returns for f/u as scheduled prior to next cycle avastin/5FU. He is tolerating well overall. Mild intermittent nausea for 2 days after pump d/c; denies emesis, constipation, or diarrhea. Notes brittle nails, taking multivitamin. Peripheral neuropathy to hands and feet is stable but occasionally worse with cold weather; continues to report numbness in groin with poor sexual performance, unable to achieve erection. Asking for viagra. He has no loss of bladder or bowel control. He plans to have cataract surgery prior to next chemo cycle. He is eager to learn CT results today.  REVIEW OF SYSTEMS:   Constitutional: Denies fatigue, fevers, chills or abnormal weight loss Eyes: Denies blurriness of vision Ears, nose, mouth, throat, and face: Denies mucositis or sore throat Respiratory: Denies cough, dyspnea or wheezes Cardiovascular: Denies palpitation, chest discomfort or lower extremity swelling Gastrointestinal:  Denies nausea, vomiting, constipation, diarrhea, heartburn or change in bowel habits Skin: Denies abnormal skin rashes Lymphatics: Denies new lymphadenopathy or easy bruising Neurological:Denies new weaknesses (+) numbness, tingling to hands and feet, stable overall but occasionally worse with cold weather (+) groin numbness with  associated difficulty achieving erection; no loss of bladder or bowel control  Behavioral/Psych: Mood is stable, no new changes  All other systems were reviewed with the patient and are negative.  MEDICAL HISTORY:  Past Medical History:  Diagnosis Date  . Anxiety   . Cancer of ascending colon ([place])   . Seasonal allergies     SURGICAL HISTORY: Past Surgical History:  Procedure Laterality Date  . APPENDECTOMY  1992  . CATARACT EXTRACTION W/ INTRAOCULAR LENS IMPLANT Left 04/2016  . COLON  SURGERY    . COLONOSCOPY W/ BIOPSIES AND POLYPECTOMY  04/2015  . INGUINAL HERNIA REPAIR Right 1982  . IR RADIOLOGIST EVAL & MGMT  01/16/2017  . KNEE ARTHROSCOPY W/ MENISCECTOMY Right 1998  . LAPAROSCOPIC RIGHT COLECTOMY N/A 06/14/2016   Procedure: LAPAROSCOPIC HAND ASSISTED HEMICOLECTOMY AND SMALL BOWEL RESECTION.;  Surgeon: [name], MD;  Location: [place] [facility];  Service: General;  Laterality: N/A;  . LIVER BIOPSY Right 06/14/2016   Procedure: LIVER BIOPSY;  Surgeon: [name], MD;  Location: [place];  Service: General;  Laterality: Right;  Right Inferior Liver  . PORTACATH PLACEMENT N/A 07/06/2016   Procedure: INSERTION PORT-A-CATH;  Surgeon: [name], MD;  Location: [facility];  Service: General;  Laterality: N/A;  . VASECTOMY  2005    I have reviewed the social history and family history with the patient and they are unchanged from previous note.  ALLERGIES:  is allergic to penicillins and xeloda [capecitabine].  MEDICATIONS:  Current Outpatient Medications  Medication Sig Dispense Refill  . amLODipine (NORVASC) 10 MG tablet Take 1 tablet (10 mg total) by mouth daily. 30 tablet 2  . celecoxib (CELEBREX) 200 MG capsule Take 200 mg by mouth daily.  1  . dexamethasone (DECADRON) 4 MG tablet For 5 days after chemo (begin day 2) 10 tablet 0  . diphenoxylate-atropine (LOMOTIL) 2.5-0.025 MG tablet Take 2 tablets by mouth 4 (four) times daily as needed for diarrhea or loose stools. 30 tablet 0  . FLUoxetine (PROZAC) 40 MG capsule Take 40 mg by mouth daily.    Marland Kitchen lidocaine-prilocaine (EMLA) cream Apply to affected area once 30 g 3  . lisinopril (PRINIVIL) 10 MG tablet Take 1 tablet (10 mg total) by mouth daily. 90 tablet 1  . Multiple Vitamin (MULTIVITAMIN WITH MINERALS) TABS tablet Take 1 tablet by mouth daily.    . ondansetron (ZOFRAN ODT) 8 MG disintegrating tablet Take 1 tablet (8 mg total) by mouth every 8 (eight) hours as needed for nausea or vomiting. 30 tablet 2  .  prochlorperazine (COMPAZINE) 10 MG tablet Take 1 tablet (10 mg total) by mouth every 6 (six) hours as needed for nausea or vomiting. 30 tablet 2  . urea (CARMOL) 10 % cream Apply topically 3 (three) times daily. 71 g 1  . LORazepam (ATIVAN) 0.5 MG tablet Take 1 tablet (0.5 mg total) by mouth every 8 (eight) hours as needed (for nausea). (Patient not taking: Reported on 04/12/2017) 30 tablet 0  . traMADol (ULTRAM) 50 MG tablet Take 1-2 tablets (50-100 mg total) by mouth every 6 (six) hours as needed for moderate pain or severe pain. (Patient not taking: Reported on 04/12/2017) 30 tablet 0   No current facility-administered medications for this visit.    Facility-Administered Medications Ordered in Other Visits  Medication Dose Route Frequency Provider Last Rate Last Dose  . clindamycin (CLEOCIN) 900 mg in dextrose 5 % 50 mL IVPB  900 mg Intravenous 60 min Pre-Op [name], MD  And  . gentamicin (GARAMYCIN) 450 mg in dextrose 5 % 50 mL IVPB  5 mg/kg Intravenous 60 min Pre-Op [name], MD      . fluorouracil (ADRUCIL) 4,600 mg in sodium chloride 0.9 % 58 mL chemo infusion  2,200 mg/m2 (Treatment Plan Recorded) Intravenous 1 day or 1 dose Truitt Merle, MD   4,600 mg at 04/12/17 1302  . heparin lock flush 100 unit/mL  500 Units Intracatheter Once PRN Truitt Merle, MD      . sodium chloride flush (NS) 0.9 % injection 10 mL  10 mL Intracatheter PRN Truitt Merle, MD        PHYSICAL EXAMINATION: ECOG PERFORMANCE STATUS: 1 - Symptomatic but completely ambulatory  Vitals:   04/12/17 0958  BP: 137/85  Pulse: 65  Resp: 18  Temp: 97.9 F (36.6 C)  SpO2: 100%   Filed Weights   04/12/17 0958  Weight: 204 lb 6.4 oz (92.7 kg)    GENERAL:alert, no distress and comfortable SKIN: skin color, texture, turgor are normal, no rashes or significant lesions EYES: normal, Conjunctiva are pink and non-injected, sclera clear OROPHARYNX:no exudate, no erythema and lips, buccal mucosa, and tongue normal    NECK: supple, thyroid normal size, non-tender, without nodularity LYMPH:  no palpable cervical, supraclavicular, or axillary lymphadenopathy LUNGS: clear to auscultation bilaterally with normal breathing effort HEART: regular rate & rhythm and no murmurs and no lower extremity edema ABDOMEN:abdomen soft, non-tender and normal bowel sounds. No palpable hepatomegaly Musculoskeletal:no cyanosis of digits and no clubbing  NEURO: alert & oriented x 3 with fluent speech, no focal motor or sensory deficits by tuning fork exam  LABORATORY DATA:  I have reviewed the data as listed CBC Latest Ref Rng & Units 04/12/2017 03/29/2017 03/15/2017  WBC 4.0 - 10.3 10e3/uL 4.8 4.1 4.3  Hemoglobin 13.0 - 17.1 g/dL 13.1 12.2(L) 12.1(L)  Hematocrit 38.4 - 49.9 % 40.3 37.7(L) 37.0(L)  Platelets 140 - 400 10e3/uL 113(L) 138(L) 164     CMP Latest Ref Rng & Units 04/12/2017 03/29/2017 03/15/2017  Glucose 70 - 140 mg/dl 83 104 109  BUN 7.0 - 26.0 mg/dL 20.7 17.7 19.3  Creatinine 0.7 - 1.3 mg/dL 1.2 1.1 1.1  Sodium 136 - 145 mEq/L 139 141 139  Potassium 3.5 - 5.1 mEq/L 4.8 4.5 4.7  Chloride 101 - 111 mmol/L - - -  CO2 22 - 29 mEq/L '26 24 24  '$ Calcium 8.4 - 10.4 mg/dL 10.0 9.4 9.5  Total Protein 6.4 - 8.3 g/dL 7.3 6.9 7.0  Total Bilirubin 0.20 - 1.20 mg/dL 0.58 0.49 0.43  Alkaline Phos 40 - 150 U/L 51 49 66  AST 5 - 34 U/L '17 20 18  '$ ALT 0 - 55 U/L '15 16 17    '$ RADIOGRAPHIC STUDIES: I have personally reviewed the radiological images as listed and agreed with the findings in the report. No results found.   CT CAP 04/10/17 IMPRESSION: 1. Status post right hemicolectomy without findings for recurrent tumor. 2. No worrisome hepatic lesions. Treated disease with only a small residual low attenuation lesion in the right hepatic lobe. 3. No recurrent mediastinal or abdominal lymphadenopathy.   ASSESSMENT & PLAN: 56 y.o. Caucasian male, without significant past medical history, presented with rectal bleeding  in December 2016, colonoscopy showed 4 polyps, 2 of them showed invasive adenocarcinoma, unfortunately he was not aware of the pathology findings and was not treated. He now presented with fatigue, weight loss, anorexia and abdominal pain  1. Right colon cancer with  liver and node metastasis, pT4bN2bM1a, stage IV, MSI-stable, KRAS mutation (+) 2. Hypertension 3. Anemia of iron deficiency and chemo 4. Anorexia and weight loss 5. Peripheral neuropathy, G1 6. Goals of care discussion 7. Mild thrombocytopenia  Mr. Umbarger appears stable today, tolerating maintenance avastin and 5FU well overall. He continues to note peripheral neuropathy to hands and feet that is stable overall but also to groin, making it sexual performance difficult. This would be an unusual presentation of neuropathy secondary to chemotherapy, but suppose it's possible. Not recommending viagra at this time due to increased risk of vasospasm with 5FU. I reviewed this with him. We discussed low dose neurontin for peripheral neuropathy but he does not want to add medication at this time. For brittle nails I recommend adding biotin, he will do so. VS and weight stable, Cmet unremarkable; anemia resolved, iron studies unremarkable; will continue to monitir. Mild thrombocytopenia on CBC, plt 113k; he denies bleeding. CEA WNL 4.36. We reviewed CT CAP in detail today, which shows s/p right hemicolectomy without recurrent disease, no worrisome hepatic lesions, and no recurrent mediastinal or abdominal lymphadenopathy. Will continue current maintenance regimen, proceed with next cycle avastin, leucovorin, and 5FU today. He will delay next cycle 1 week for cataract surgery. Return in 3 weeks prior to next cycle.   PLAN Biotin for brittle nails, continue multivitamin  CT reviewed, continue maintenance therapy Labs reviewed, mild thrombocytopenia; adequate for treatment proceed with next cycle avastin/leucovorin/5FU Return in 3 weeks for next  cycle (delayed 1 week for cataract surgery)  All questions were answered. The patient knows to call the clinic with any problems, questions or concerns. No barriers to learning was detected.     Alla Feeling, NP 04/12/17

## 2017-04-14 ENCOUNTER — Ambulatory Visit (HOSPITAL_BASED_OUTPATIENT_CLINIC_OR_DEPARTMENT_OTHER): Payer: BLUE CROSS/BLUE SHIELD

## 2017-04-14 VITALS — BP 134/90 | HR 75 | Temp 97.4°F | Resp 18

## 2017-04-14 DIAGNOSIS — C182 Malignant neoplasm of ascending colon: Secondary | ICD-10-CM | POA: Diagnosis not present

## 2017-04-14 DIAGNOSIS — C787 Secondary malignant neoplasm of liver and intrahepatic bile duct: Principal | ICD-10-CM

## 2017-04-14 DIAGNOSIS — C189 Malignant neoplasm of colon, unspecified: Secondary | ICD-10-CM

## 2017-04-14 MED ORDER — SODIUM CHLORIDE 0.9% FLUSH
10.0000 mL | INTRAVENOUS | Status: DC | PRN
  Administered 2017-04-14: 10 mL
  Filled 2017-04-14: qty 10

## 2017-04-14 MED ORDER — HEPARIN SOD (PORK) LOCK FLUSH 100 UNIT/ML IV SOLN
500.0000 [IU] | Freq: Once | INTRAVENOUS | Status: AC | PRN
  Administered 2017-04-14: 500 [IU]
  Filled 2017-04-14: qty 5

## 2017-04-30 ENCOUNTER — Encounter: Payer: Self-pay | Admitting: Nurse Practitioner

## 2017-04-30 NOTE — Progress Notes (Signed)
Insurance coverage for [employer] is still valid through 12/13/17 per Sena Hitch, Therapist, [facility]

## 2017-05-02 NOTE — Progress Notes (Signed)
[place]  [phone] [phone]  Clinic Follow Up Note  Patient Care Team: Curlene Labrum, MD as PCP - General (Family Medicine)   Date of Service:  05/03/2017   CHIEF COMPLAINTS:  Follow up metastatic right colon cancer   Oncology History   Cancer Staging Metastatic colon cancer to liver [facility]) Staging form: Colon and Rectum, AJCC 8th Edition - Clinical stage from 06/01/2016: Stage IVA (cTX, cNX, pM1a) - Signed by Truitt Merle, MD on 07/04/2016 - Pathologic stage from 06/14/2016: Stage IVA (pT4b(m), pN2b, pM1a) - Signed by Truitt Merle, MD on 07/04/2016       Metastatic colon cancer to liver ([place])   04/2015 Procedure    Colonoscopy by Dr. Ladona Horns. It showed showed 2 sessile polyps ready between 3-5 mm in size located 20 cm (A, B) from the point of entry, polypectomy was performed. Pedunculated polyp was found in the ascending colon (C), polypectomy was performed, and additional polyp (D) was found 30 cm from the point of entry, removed      04/2015 Pathology Results    tubular adenoma (A and B), and well differentiated adenocarcinoma arising from tubulovillous adenoma (C) and well differentiated adenocarcinoma arising from severe dysplasia to intramucosal carcinoma within tubular adenoma.       04/2015 Initial Diagnosis    Metastatic colon cancer to liver ([place])      05/29/2016 Imaging    CT abdomen and pelvis with contrast showed an apple core like stricture in right colon just above the cecum, measuring 3.2 cm in lengths. This is highly suspicious for malignancy. Small lymph node a noticed he had adjacent mesentery, measuring 8 mm. There is a low-density lesion within the inferior aspect of the right hepatic lobe measuring 1.7 cm, suspicious for metastasis.       06/06/2016 Tumor Marker    CEA 9.99      06/08/2016 PET scan    IMPRESSION: Approximately 3 cm hypermetabolic mass in the ascending colon, consistent with primary colon carcinoma.  This mass results in colonic obstruction and small bowel dilatation. Additional areas of hypermetabolic wall thickening in the cecum may represent other sites of colon carcinoma or colitis. Mild hypermetabolic lymphadenopathy in right pericolonic region, porta hepatis, and aortocaval space, consistent with metastatic disease. Mild hypermetabolic mediastinal lymphadenopathy also seen, and thoracic lymph node metastases cannot be excluded. Solitary hypermetabolic focus in inferior right hepatic lobe, consistent with liver metastasis. Consider abdomen MRI without and with contrast for further evaluation.      06/14/2016 Surgery    Hand assisted right hemicolectomy and small bowel resection for colon cancer, liver biopsy, by Dr. Barry Dienes      06/14/2016 Pathology Results    Right hemicolectomy showed invasive well to moderately differentiated adenocarcinoma, 2 foci measuring 7.5 cm and 4.5 cm, tumor invades through full thickness of colon, to the seroma and involve the Small Bowel, Surgical Margins Were Negative, 24 Out Of 64 Lymph Nodes Were Positive, Extracapsular Extension Identified, Multiple Satellite Tumor Deposits Present, Liver Biopsy Showed Metastatic Adenocarcinoma.        06/14/2016 Miscellaneous    Tumor MMR normal, MSI stable       06/14/2016 Miscellaneous    Foundation one genomic testing showed K-ras G12 D mutation, APC and TP53 mutation. No BRAF and NRAS mutation. MSI-stable, tumor burden low.      07/06/2016 Tumor Marker    CEA 13.69      07/13/2016 - 01/18/2017 Chemotherapy    mFOLFOX, every 2  weeks, started on 07/14/2015, Avastin added from cycle 3  Oxaliplatin dose to 81m/m2 due to side effects and some cytopenia on 09/21/16  Changed to FOLFIRINOX starting cycle 7 and Reduced  Due to neuropathy hold Oxaliplatin and add Irinotecan with neulasta on day 3 starting with cycle 7 Add low dose Oxaliplatin with cycle 8.  Due to his worsening neuropathy, and good response to  chemotherapy, I previously stopped oxaliplatin from cycle 11, and continue FOLFIRI and avastin         07/27/2016 Tumor Marker    CEA 15.85      09/04/2016 Imaging    Ct C/A/P W Contrast IMPRESSION: Interval right colectomy. Stable small liver metastasis in the inferior right hepatic lobe. Stable mild porta hepatis and aortocaval lymphadenopathy. Stable mild mediastinal lymphadenopathy. No new or progressive metastatic disease identified within the chest, abdomen, or pelvis.      12/19/2016 PET scan    IMPRESSION: 1. Right hemicolectomy, with resolution of the prior hypermetabolic activity inferiorly in the right hepatic lobe, in several mediastinal lymph nodes, and in lymph nodes in the retroperitoneum and porta hepatis. No residual hypermetabolic or enlarged lymph nodes are identified. 2. Low-grade diffuse skeletal metabolic activity is likely therapy related. 3. Coronary atherosclerosis. 4. 3 by 4 mm right middle lobe pulmonary nodule is stable, not appreciably hypermetabolic, but below sensitive PET-CT size thresholds. This may warrant surveillance.      01/11/2017 Imaging    MR Abdomen W WO Contrast IMPRESSION: 1. No acute findings within the abdomen. Previously noted liver metastasis has resolved in the interval. No new lesions.      02/01/2017 -  Chemotherapy    Maintenance therapy, Xeloda 20021m(1000 mg/m2)  q12h on day 1-14 every 21 days plus AVASTIN, starting 02/01/2017.  stopped after 12 days due to poor tolerance on 02/14/17  Changed to maintenance 5-FU and avastin every 2 weeks starting on 02/22/17        04/10/2017 Imaging    IMPRESSION: 1. Status post right hemicolectomy without findings for recurrent tumor. 2. No worrisome hepatic lesions. Treated disease with only a small residual low attenuation lesion in the right hepatic lobe. 3. No recurrent mediastinal or abdominal lymphadenopathy.        HISTORY OF PRESENTING ILLNESS (06/01/2016):  [name]89.o. male is here because of his recently abdominal CT which is highly suspicious for metastatic colon cancer. He is accompanied by his wife to my clinic today. He was referred by his primary care physician Dr. BuPleas Koch  He had colonoscopy in 2016 for mild rectal bleeding, he describe small amount fresh blood mixed with stool, he has no other constitutional symptoms at that time. He was referred to gastroenterologist Dr. CaLadona Hornsnd underwent a colonoscopy in December 2016. The colonoscopy showed 2 sessile polyps ready between 3-5 mm in size located 20 cm (A, B) from the point of entry, polypectomy was performed. Pedunculated polyp was found in the ascending colon (C), polypectomy was performed, and additional polyp (D) was found 30 cm from the point of entry, removed. The pathology reviewed tubular adenoma (A and B), and well differentiated adenocarcinoma arising from tubulovillous adenoma (C) and well differentiated adenocarcinoma arising from severe dysplasia to intramucosal carcinoma within tubular adenoma. Dr. CaTye Marylandried multiple times to reach patient, but patient sought they were calling him about the bill, and did not return the phone calls. He was not aware the cancer diagnosis until recently.   He started having diarrhea and vomiting  in mid Dec 2017, and felt a "pop" in right side abdomen, he was seen at urgent care, and was treated with antiemetics, and IVF, lab tests were OK. Due to his persistent intermittent diarrhea and epigastric pain since then, he was seen by PCP and he eventually had CT abdomen and pelvis scan which showed a upper core lesion in the ascending colon, and I'll 1.7 cm lesion in the liver, highly suspicious for metastasis. He was referred to [place] for further evaluation.  He has lost 30 lbs in the past one month, has low appetite, eats a small meals 1-2 times a day. He has moderate fatigue, able to tolerate routine activities including his work, but feels exhausted at  the end of study. He has occasional constipation, denies recent rectal bleeding.  CURRENT THERAPY: Maintenance 5-Fu and Avastin started 01/04/2017, he tried one cycle Xeloda to replace 5-fu, but could not tolerate. Added leucovorin and decreased 5-Fu 10% starting on 03/15/17.  INTERIM HISTORY:  [name] returns today for follow up and treatment. He presents to the clinic today noting he had cataract surgery on on 05/01/17. He is recovering well.  He notes his fingers are still sensitive and his feet he can feel but he knows it is numb. This is stable for him. He is currently on short term disability before changing to long term disability in the next year.     MEDICAL HISTORY:  Past Medical History:  Diagnosis Date  . Anxiety   . Cancer of ascending colon ([place])   . Seasonal allergies     SURGICAL HISTORY: Past Surgical History:  Procedure Laterality Date  . APPENDECTOMY  1992  . CATARACT EXTRACTION W/ INTRAOCULAR LENS IMPLANT Left 04/2016  . COLON SURGERY    . COLONOSCOPY W/ BIOPSIES AND POLYPECTOMY  04/2015  . INGUINAL HERNIA REPAIR Right 1982  . IR RADIOLOGIST EVAL & MGMT  01/16/2017  . KNEE ARTHROSCOPY W/ MENISCECTOMY Right 1998  . LAPAROSCOPIC RIGHT COLECTOMY N/A 06/14/2016   Procedure: LAPAROSCOPIC HAND ASSISTED HEMICOLECTOMY AND SMALL BOWEL RESECTION.;  Surgeon: [name], MD;  Location: [place];  Service: General;  Laterality: N/A;  . LIVER BIOPSY Right 06/14/2016   Procedure: LIVER BIOPSY;  Surgeon: [name], MD;  Location: [place];  Service: General;  Laterality: Right;  Right Inferior Liver  . PORTACATH PLACEMENT N/A 07/06/2016   Procedure: INSERTION PORT-A-CATH;  Surgeon: [name], MD;  Location: [place];  Service: General;  Laterality: N/A;  . VASECTOMY  2005    SOCIAL HISTORY: Social History   Socioeconomic History  . Marital status: Married    Spouse name: Not on file  . Number of children: Not on file  . Years of education: Not on file  .  Highest education level: Not on file  Social Needs  . Financial resource strain: Not on file  . Food insecurity - worry: Not on file  . Food insecurity - inability: Not on file  . Transportation needs - medical: Not on file  . Transportation needs - non-medical: Not on file  Occupational History  . Not on file  Tobacco Use  . Smoking status: Former Smoker    Years: 2.00    Types: Cigarettes    Last attempt to quit: 1990    Years since quitting: 28.9  . Smokeless tobacco: Never Used  Substance and Sexual Activity  . Alcohol use: Yes    Alcohol/week: 2.4 oz    Types: 4 Cans of beer per week  Comment: social, none since colon surgery  . Drug use: No    Comment: 06/15/2016 "nothing since college"  . Sexual activity: Yes  Other Topics Concern  . Not on file  Social History Narrative  . Not on file   He is married. They have 3 boys, 13-12 yo. He works for a [employer], desk job.   FAMILY HISTORY: Family History  Problem Relation Age of Onset  . Cancer Mother        lung cancer  . Stroke Mother   . Hypertension Father   . CAD Father   . Cancer Maternal Grandfather        prostate cancer     ALLERGIES:  is allergic to penicillins and xeloda [capecitabine].  MEDICATIONS:  Current Outpatient Medications  Medication Sig Dispense Refill  . amLODipine (NORVASC) 10 MG tablet Take 1 tablet (10 mg total) by mouth daily. 30 tablet 2  . celecoxib (CELEBREX) 200 MG capsule Take 200 mg by mouth daily.  1  . dexamethasone (DECADRON) 4 MG tablet For 5 days after chemo (begin day 2) 10 tablet 0  . diphenoxylate-atropine (LOMOTIL) 2.5-0.025 MG tablet Take 2 tablets by mouth 4 (four) times daily as needed for diarrhea or loose stools. 30 tablet 0  . FLUoxetine (PROZAC) 40 MG capsule Take 40 mg by mouth daily.    Marland Kitchen lidocaine-prilocaine (EMLA) cream Apply to affected area once 30 g 3  . lisinopril (PRINIVIL) 10 MG tablet Take 1 tablet (10 mg total) by mouth daily. 90 tablet 2  .  Multiple Vitamin (MULTIVITAMIN WITH MINERALS) TABS tablet Take 1 tablet by mouth daily.    . ondansetron (ZOFRAN ODT) 8 MG disintegrating tablet Take 1 tablet (8 mg total) by mouth every 8 (eight) hours as needed for nausea or vomiting. 30 tablet 2  . prochlorperazine (COMPAZINE) 10 MG tablet Take 1 tablet (10 mg total) by mouth every 6 (six) hours as needed for nausea or vomiting. 30 tablet 2  . LORazepam (ATIVAN) 0.5 MG tablet Take 1 tablet (0.5 mg total) by mouth every 8 (eight) hours as needed (for nausea). (Patient not taking: Reported on 04/12/2017) 30 tablet 0  . traMADol (ULTRAM) 50 MG tablet Take 1-2 tablets (50-100 mg total) by mouth every 6 (six) hours as needed for moderate pain or severe pain. (Patient not taking: Reported on 04/12/2017) 30 tablet 0  . urea (CARMOL) 10 % cream Apply topically 3 (three) times daily. (Patient not taking: Reported on 05/03/2017) 71 g 1   No current facility-administered medications for this visit.    Facility-Administered Medications Ordered in Other Visits  Medication Dose Route Frequency Provider Last Rate Last Dose  . clindamycin (CLEOCIN) 900 mg in dextrose 5 % 50 mL IVPB  900 mg Intravenous 60 min Pre-Op [name], MD       And  . gentamicin (GARAMYCIN) 450 mg in dextrose 5 % 50 mL IVPB  5 mg/kg Intravenous 60 min Pre-Op [name], MD      . dextrose 5 % solution   Intravenous Once Truitt Merle, MD      . fluorouracil (ADRUCIL) 4,600 mg in sodium chloride 0.9 % 58 mL chemo infusion  2,200 mg/m2 (Treatment Plan Recorded) Intravenous 1 day or 1 dose Truitt Merle, MD   4,600 mg at 05/03/17 1130  . heparin lock flush 100 unit/mL  500 Units Intracatheter Once PRN Truitt Merle, MD      . sodium chloride flush (NS) 0.9 % injection 10 mL  10 mL Intracatheter PRN Truitt Merle, MD         REVIEW OF SYSTEMS:  Constitutional: Denies fevers, chills or abnormal night sweats  Eyes: Denies blurriness of vision, double vision or watery eyes Ears, nose, mouth,  throat, and face: negative Respiratory:negative  Cardiovascular: Denies palpitation, chest discomfort or lower extremity swelling Gastrointestinal:  Denies heartburn or change in bowel habits Skin:normal Lymphatics: Denies new lymphadenopathy or easy bruising Neurological: (+) residual numbness/tingling to hands MSK: Muscle weakness in hands Behavioral/Psych: Mood is stable, no new changes  All other systems were reviewed with the patient and are negative.  PHYSICAL EXAMINATION:  ECOG PERFORMANCE STATUS: 1 - Symptomatic but completely ambulatory  Vitals:   05/03/17 0859  BP: 132/90  Pulse: 79  Resp: 18  Temp: 97.9 F (36.6 C)  TempSrc: Oral  SpO2: 99%  Weight: 199 lb 8 oz (90.5 kg)  Height: _0  (1.803 m)   GENERAL:alert, no distress and comfortable. SKIN: skin color, texture, turgor are normal, no rashes or significant lesions EYES: normal, conjunctiva are pink and non-injected, sclera clear OROPHARYNX:no exudate, no erythema and lips, buccal mucosa, and tongue normal   NECK: supple, thyroid normal size, non-tender, without nodularity LYMPH:  no palpable lymphadenopathy in the cervical, axillary or inguinal LUNGS: clear to auscultation and percussion with normal breathing effort HEART: regular rate & rhythm and no murmurs and no lower extremity edema ABDOMEN:abdomen soft, Surgical scar in the midline around the umbilical has healed well. non-tender and normal bowel sounds Musculoskeletal:no cyanosis of digits and no clubbing  PSYCH: alert & oriented x 3 with fluent speech NEURO: no focal motor/sensory deficits (+) mildly decreased bilateral vibration sensation in feet  LABORATORY DATA:  I have reviewed the data as listed CBC Latest Ref Rng & Units 05/03/2017 04/12/2017 03/29/2017  WBC 4.0 - 10.3 10e3/uL 3.6(L) 4.8 4.1  Hemoglobin 13.0 - 17.1 g/dL 13.7 13.1 12.2(L)  Hematocrit 38.4 - 49.9 % 40.4 40.3 37.7(L)  Platelets 140 - 400 10e3/uL 149 113(L) 138(L)   CMP Latest  Ref Rng & Units 05/03/2017 04/12/2017 03/29/2017  Glucose 70 - 140 mg/dl 111 83 104  BUN 7.0 - 26.0 mg/dL 14.7 20.7 17.7  Creatinine 0.7 - 1.3 mg/dL 1.3 1.2 1.1  Sodium 136 - 145 mEq/L 140 139 141  Potassium 3.5 - 5.1 mEq/L 4.4 4.8 4.5  Chloride 101 - 111 mmol/L - - -  CO2 22 - 29 mEq/L _1 Calcium 8.4 - 10.4 mg/dL 9.3 10.0 9.4  Total Protein 6.4 - 8.3 g/dL 7.3 7.3 6.9  Total Bilirubin 0.20 - 1.20 mg/dL 0.83 0.58 0.49  Alkaline Phos 40 - 150 U/L 57 51 49  AST 5 - 34 U/L _2 ALT 0 - 55 U/L _3 PATHOLOGY   Diagnosis 06/14/2016 1. Colon, segmental resection for tumor, Right Ascending Hemicolectomy and Small Bowel - INVASIVE WELL TO MODERATELY DIFFERENTIATED ADENOCARCINOMA. - TWO TUMOR FOCI MEASURING 7.5 CM AND 4.5 CM IN GREATEST DIMENSION. - TUMOR INVADES THROUGH FULL THICKNESS OF COLON, THROUGH THE SEROSA TO INVOLVE THE SMALL BOWEL. - MARGINS ARE NEGATIVE. - TWENTY FOUR OF SIXTY FOUR LYMPH NODES POSITIVE FOR METASTATIC ADENOCARCINOMA (24/64). - EXTRACAPSULAR EXTENSION IDENTIFIED - MULTIPLE SATELLITE TUMOR DEPOSITS PRESENT. - SEE ONCOLOGY TEMPLATE. 2. Liver, needle/core biopsy, Right Inferior - POSITIVE FOR METASTATIC ADENOCARCINOMA.    RADIOGRAPHIC STUDIES: I have personally reviewed his outside CT scan from 05/29/2016 and agreed with the findings in the report. Ct Chest W  Contrast  Result Date: 04/10/2017 CLINICAL DATA:  Restaging colon cancer. Initial diagnosis January 2018. Status post colon resection and ongoing chemotherapy. EXAM: CT CHEST, ABDOMEN, AND PELVIS WITH CONTRAST TECHNIQUE: Multidetector CT imaging of the chest, abdomen and pelvis was performed following the standard protocol during bolus administration of intravenous contrast. CONTRAST:  100 cc Isovue-300 COMPARISON:  PET-CT 12/19/2016 and 06/08/2016 FINDINGS: CT CHEST FINDINGS Cardiovascular: The heart is normal in size. No pericardial effusion. The aorta is normal in caliber. No dissection. The  branch vessels patent. Stable coronary artery calcifications. Mediastinum/Nodes: No recurrent mediastinal or hilar lymphadenopathy. Small scattered lymph nodes are stable. 7 mm lymph node between the esophagus and aorta on image number 31 previously measured 12 mm. The esophagus is grossly normal. Lungs/Pleura: No worrisome pulmonary nodule or acute pulmonary findings. 4 mm nodular density in the right middle lobe on the prior study has resolved. Musculoskeletal: The no chest wall mass, supraclavicular or axillary lymphadenopathy. Small scattered lymph nodes are stable. Left-sided Port-A-Cath is stable. The thyroid gland is unremarkable. No significant bony findings. CT ABDOMEN PELVIS FINDINGS Hepatobiliary: No definite hepatic lesions are identified. Small faint area of low attenuation in the right hepatic lobe on image number 72 measures 8 mm and previously measured 17 mm. The gallbladder is normal.  No common bile duct dilatation. Pancreas: No mass, inflammation or ductal dilatation. Spleen: Stable mild splenomegaly.  No focal lesions. Adrenals/Urinary Tract: The adrenal glands and kidneys normal and stable. No bladder lesions. Stomach/Bowel: Surgical changes from a right hemicolectomy. No complicating features. No findings for recurrent tumor. The stomach, duodenum, small bowel and remainder of the colon are unremarkable. Vascular/Lymphatic: No abdominal/ pelvic lymphadenopathy. Prior lymph nodes have resolved. A few small scattered nodes are stable. The aorta branch vessels are patent. The major venous structures are patent. Reproductive: The prostate gland and seminal vesicles are unremarkable. Other: No pelvic mass or adenopathy. No free pelvic fluid collections. No inguinal mass or adenopathy. No abdominal wall hernia or subcutaneous lesions. Musculoskeletal: No significant bony findings. IMPRESSION: 1. Status post right hemicolectomy without findings for recurrent tumor. 2. No worrisome hepatic lesions.  Treated disease with only a small residual low attenuation lesion in the right hepatic lobe. 3. No recurrent mediastinal or abdominal lymphadenopathy. Electronically Signed   By: Marijo Sanes M.D.   On: 04/10/2017 10:30   Ct Abdomen Pelvis W Contrast  Result Date: 04/10/2017 CLINICAL DATA:  Restaging colon cancer. Initial diagnosis January 2018. Status post colon resection and ongoing chemotherapy. EXAM: CT CHEST, ABDOMEN, AND PELVIS WITH CONTRAST TECHNIQUE: Multidetector CT imaging of the chest, abdomen and pelvis was performed following the standard protocol during bolus administration of intravenous contrast. CONTRAST:  100 cc Isovue-300 COMPARISON:  PET-CT 12/19/2016 and 06/08/2016 FINDINGS: CT CHEST FINDINGS Cardiovascular: The heart is normal in size. No pericardial effusion. The aorta is normal in caliber. No dissection. The branch vessels patent. Stable coronary artery calcifications. Mediastinum/Nodes: No recurrent mediastinal or hilar lymphadenopathy. Small scattered lymph nodes are stable. 7 mm lymph node between the esophagus and aorta on image number 31 previously measured 12 mm. The esophagus is grossly normal. Lungs/Pleura: No worrisome pulmonary nodule or acute pulmonary findings. 4 mm nodular density in the right middle lobe on the prior study has resolved. Musculoskeletal: The no chest wall mass, supraclavicular or axillary lymphadenopathy. Small scattered lymph nodes are stable. Left-sided Port-A-Cath is stable. The thyroid gland is unremarkable. No significant bony findings. CT ABDOMEN PELVIS FINDINGS Hepatobiliary: No definite hepatic lesions are identified. Small faint  area of low attenuation in the right hepatic lobe on image number 72 measures 8 mm and previously measured 17 mm. The gallbladder is normal.  No common bile duct dilatation. Pancreas: No mass, inflammation or ductal dilatation. Spleen: Stable mild splenomegaly.  No focal lesions. Adrenals/Urinary Tract: The adrenal glands  and kidneys normal and stable. No bladder lesions. Stomach/Bowel: Surgical changes from a right hemicolectomy. No complicating features. No findings for recurrent tumor. The stomach, duodenum, small bowel and remainder of the colon are unremarkable. Vascular/Lymphatic: No abdominal/ pelvic lymphadenopathy. Prior lymph nodes have resolved. A few small scattered nodes are stable. The aorta branch vessels are patent. The major venous structures are patent. Reproductive: The prostate gland and seminal vesicles are unremarkable. Other: No pelvic mass or adenopathy. No free pelvic fluid collections. No inguinal mass or adenopathy. No abdominal wall hernia or subcutaneous lesions. Musculoskeletal: No significant bony findings. IMPRESSION: 1. Status post right hemicolectomy without findings for recurrent tumor. 2. No worrisome hepatic lesions. Treated disease with only a small residual low attenuation lesion in the right hepatic lobe. 3. No recurrent mediastinal or abdominal lymphadenopathy. Electronically Signed   By: Marijo Sanes M.D.   On: 04/10/2017 10:30   MR Abdomen W WO Contrast 01/11/2017 IMPRESSION: 1. No acute findings within the abdomen. Previously noted liver metastasis has resolved in the interval. No new lesions.  CT CAP 09/04/16 IMPRESSION: Interval right colectomy. Stable small liver metastasis in the inferior right hepatic lobe. Stable mild porta hepatis and aortocaval lymphadenopathy. Stable mild mediastinal lymphadenopathy. No new or progressive metastatic disease identified within the chest, abdomen, or pelvis.  ASSESSMENT & PLAN:  56 y.o. Caucasian male, without significant past medical history, presented with rectal bleeding in December 2016, colonoscopy showed 4 polyps, 2 of them showed invasive adenocarcinoma, unfortunately he was not aware of the pathology findings and was not treated. He now presented with fatigue, weight loss, anorexia and abdominal pain  1. Right colon cancer  with liver and node metastasis, pT4bN2bM1a, stage IV, MSI-stable, KRAS mutation (+) -I previously reviewed her PET scan findings, which showed solitary liver metastasis, abdominal and possible thoracic node metastasis  -His liver biopsy confirmed metastasis -Due to the bowel obstruction, he underwent upfront hemicolectomy. I previously reviewed his surgical pathology findings, which showed 2 primary right colon cancer, very locally advanced with 24 lymph nodes positive, surgical margins were negative. -I previously discussed his Foundation one genomic testing results, which showed care arrest mutation, and as high stable, low tumor burden. So he would not benefit from EGFR inhibitor, or immunotherapy alone. -The was started on palliative first-line chemotherapy FOLFOX. Avastin was added from cycle 3 -He has solitary liver metastasis, but multiple mild hypermetabolic adenopathy in mediastinum and abdomen. We previously discussed the possible local therapy options, such as liver metastectomy or ablation if he has great response to chemo and his node metastasis resolves  -restaging CT from 09/04/2016 reviewed with pt, stable disease. Will continue chemo -he developed side effects from FOLFOX, and a worsening thrombocytopenia, despite reduced dose of oxaliplatin  -Due to his overall limited response to FOLFOX, and his wish to pursue surgery, I previously recommend him to change chemo to FOLFIRINOX with low dose  oxaliplatin, hopefully he will response better.  -Due to his severe nausea and vomiting, anorexia, mild diarrhea, I'll reduce his irinotecan dose from cycle 10  - PET scan from 12/19/2016 reviewed in person. He had near complete metabolic response from chemotherapy, post his liver metastasis and hypermetabolic lymph nodes are  negative on PET scan with decreased size.  -Due to his worsening neuropathy, and good response to chemotherapy, I previously stopped oxaliplatin from cycle 11, and continue FOLFIRI  and avastin   - I previously presented his case to the GI Tumor Board and discuss liver targeted therapy vs resection. Given lymph node metastasis, I favored liver ablation by interventional radiology.  -Reviewed his restaging abdominal MRI findings, which showed no visible lesions in the liver. He has had a complete radiographic response -He was seen by interventional radiologist Dr. Earleen Newport, who does not think he need liver ablation since the solid liver met has resolved after chemo. I agree with his plan.  -He was recently seen by surgeon Dr. Barry Dienes, who recommend maintenance chemotherapy for now, no indication for resection at this point given -Given his excellent response to chemotherapy, and the poor tolerance to intravenous chemotherapy, I recommend him to change treatments to maintenance therapy -He started Xeloda and Avastin maintenance therapy 02/01/17, due to very poor tolerance he stopped Xeloda 02/14/17 -I suggest he returns to 5-Fu with avastin for maintenance therapy every 2 weeks since he tolerated this much better. I recommend he continue for at least 3 more months. I also discussed the option of weekly 5-Fu bolus with leucovorin infusion. He agreed to proceed with 5-Fu pump and avastin (02/22/17). -He has improving mucositis but developed significant skin toxicity to his feet.  -I recommended adding leucovorin and decreasing 5FU dose 10% to decrease further skin toxicity starting 03/15/17. He is tolerating this well  -He plans to have cataract surgery 05/01/17 and will likely postpone chemo that week.  -Patient would like to come off chemo if possible.  I suggest another 3-4 months maintenance therapy and repeat a scan before discussing stopping chemo. He agreed.   -Repeat staging PET scan in 06/2017.  The patient favors to take chemo break if the next PET scan is completely negative.  He understands his cancer is probably not cured, and possible disease progression after stopping  chemo. -We dicussed continuing current maintenance therapy regimen. He is fine to have chemo break when needed.  -Labs reviewed, Mild anemia resolved. White blood counts 3.6. CMP WNL. Labs adequate to proceed with treatment today. -He is tolerating treatment well. Will see him on every other cycle.  -F/u in 4 weeks   2. Hypertension -His blood pressure has been high lately, I have previously given him clonidine 0.1 mg in the infusion room before Avastin -We previously discussed Avastin can cause hypertension -I have started him on amlodipine, and increase dose to 10 mg daily -He does not want to follow-up with his primary care physician. -Previously, his blood pressure was noted to be elevated, especially diastolic. I previously added on lisinopril 10 mg daily for him -Patient blood pressure noted to be 128/79 on 01/18/2017. -Continue Norvasc and Lisinopril for now while on Avastin -Will continue to monitor -BP controlled with Lisinopril and amlodipine, refilled today   3. Anemia of iron deficiency and chemo  -He has mild anemia, likely related to his colon cancer bleeding. -I previously recommend him to take oral iron supplement over-the-counter, potential side effects of constipation and gastric or discomfort or discussed with him. He stopped taking this before surgery. -I previously advised him to begin taking oral iron supplements again. - Iron levels previously on 08/24/16 at 46. -Iron levels previously on 01/04/17 at 92. -03/15/17 iron study normal, Hg 12.1 -Hg stable at 12.2 today (03/29/17) -resolved and stable    4.  Anorexia and weight loss -Secondary to underlying malignancy -I previously encouraged him to try nutritional supplement, such as boost or initial and try to eat more -The patient has lost some weight recently -weight gain lately.   5. Peripheral neuropathy, G1 -Secondary to oxaliplatin, dose reduced and ultimately discontinued -Slightly worse with FOLFIRI, held  Irinotecan beginning 01/04/17 -Improved some lately, his hand function is normal, no balance issue, discussed with patient this may take some time to resolve.  - I strongly encouraged him to take vitamin B-complex supplements -Will continue to monitor.  6. Goal of care discussion  -We previously discussed the incurable nature of his cancer, and the overall poor prognosis, especially if he does not have good response to chemotherapy or progress on chemo -The patient understands the goal of care is palliative.  -He is full code now     Plan  -Refill lisinopril and amlodipine  -Lab reviewed, adequate for treatment, he will proceed 5-FU pump and Avastin today and continue every 2 weeks -Follow up in 4 weeks, will order his restaging PET scan on next visit  No orders of the defined types were placed in this encounter.   All questions were answered.   The patient knows to call the clinic with any problems, questions or concerns.  I spent 20 minutes counseling the patient face to face. The total time spent in the appointment was 25 minutes and more than 50% was on counseling.  This document serves as a record of services personally performed by Truitt Merle, MD. It was created on her behalf by Joslyn Devon, a trained medical scribe. The creation of this record is based on the scribe's personal observations and the provider's statements to them.     I have reviewed the above documentation for accuracy and completeness, and I agree with the above.    Truitt Merle  05/03/2017

## 2017-05-03 ENCOUNTER — Other Ambulatory Visit (HOSPITAL_BASED_OUTPATIENT_CLINIC_OR_DEPARTMENT_OTHER): Payer: BLUE CROSS/BLUE SHIELD

## 2017-05-03 ENCOUNTER — Ambulatory Visit (HOSPITAL_BASED_OUTPATIENT_CLINIC_OR_DEPARTMENT_OTHER): Payer: BLUE CROSS/BLUE SHIELD

## 2017-05-03 ENCOUNTER — Ambulatory Visit (HOSPITAL_BASED_OUTPATIENT_CLINIC_OR_DEPARTMENT_OTHER): Payer: BLUE CROSS/BLUE SHIELD | Admitting: Hematology

## 2017-05-03 ENCOUNTER — Encounter: Payer: Self-pay | Admitting: Hematology

## 2017-05-03 VITALS — BP 132/90 | HR 79 | Temp 97.9°F | Resp 18 | Ht 71.0 in | Wt 199.5 lb

## 2017-05-03 DIAGNOSIS — C182 Malignant neoplasm of ascending colon: Secondary | ICD-10-CM | POA: Diagnosis not present

## 2017-05-03 DIAGNOSIS — C787 Secondary malignant neoplasm of liver and intrahepatic bile duct: Secondary | ICD-10-CM

## 2017-05-03 DIAGNOSIS — R634 Abnormal weight loss: Secondary | ICD-10-CM

## 2017-05-03 DIAGNOSIS — I10 Essential (primary) hypertension: Secondary | ICD-10-CM

## 2017-05-03 DIAGNOSIS — D6481 Anemia due to antineoplastic chemotherapy: Secondary | ICD-10-CM

## 2017-05-03 DIAGNOSIS — C189 Malignant neoplasm of colon, unspecified: Secondary | ICD-10-CM

## 2017-05-03 DIAGNOSIS — G620 Drug-induced polyneuropathy: Secondary | ICD-10-CM | POA: Diagnosis not present

## 2017-05-03 DIAGNOSIS — Z5112 Encounter for antineoplastic immunotherapy: Secondary | ICD-10-CM | POA: Diagnosis not present

## 2017-05-03 DIAGNOSIS — D500 Iron deficiency anemia secondary to blood loss (chronic): Secondary | ICD-10-CM

## 2017-05-03 DIAGNOSIS — R630 Anorexia: Secondary | ICD-10-CM

## 2017-05-03 DIAGNOSIS — Z5111 Encounter for antineoplastic chemotherapy: Secondary | ICD-10-CM

## 2017-05-03 DIAGNOSIS — R030 Elevated blood-pressure reading, without diagnosis of hypertension: Secondary | ICD-10-CM

## 2017-05-03 DIAGNOSIS — Z452 Encounter for adjustment and management of vascular access device: Secondary | ICD-10-CM

## 2017-05-03 DIAGNOSIS — Z95828 Presence of other vascular implants and grafts: Secondary | ICD-10-CM

## 2017-05-03 LAB — CBC WITH DIFFERENTIAL/PLATELET
BASO%: 0.6 % (ref 0.0–2.0)
Basophils Absolute: 0 10*3/uL (ref 0.0–0.1)
EOS ABS: 0.1 10*3/uL (ref 0.0–0.5)
EOS%: 1.9 % (ref 0.0–7.0)
HCT: 40.4 % (ref 38.4–49.9)
HGB: 13.7 g/dL (ref 13.0–17.1)
LYMPH%: 14.8 % (ref 14.0–49.0)
MCH: 32.2 pg (ref 27.2–33.4)
MCHC: 33.8 g/dL (ref 32.0–36.0)
MCV: 95.3 fL (ref 79.3–98.0)
MONO#: 0.4 10*3/uL (ref 0.1–0.9)
MONO%: 11.3 % (ref 0.0–14.0)
NEUT#: 2.6 10*3/uL (ref 1.5–6.5)
NEUT%: 71.4 % (ref 39.0–75.0)
PLATELETS: 149 10*3/uL (ref 140–400)
RBC: 4.24 10*6/uL (ref 4.20–5.82)
RDW: 16.1 % — ABNORMAL HIGH (ref 11.0–14.6)
WBC: 3.6 10*3/uL — ABNORMAL LOW (ref 4.0–10.3)
lymph#: 0.5 10*3/uL — ABNORMAL LOW (ref 0.9–3.3)

## 2017-05-03 LAB — COMPREHENSIVE METABOLIC PANEL
ALBUMIN: 4.2 g/dL (ref 3.5–5.0)
ALK PHOS: 57 U/L (ref 40–150)
ALT: 19 U/L (ref 0–55)
AST: 23 U/L (ref 5–34)
Anion Gap: 10 mEq/L (ref 3–11)
BUN: 14.7 mg/dL (ref 7.0–26.0)
CO2: 25 mEq/L (ref 22–29)
CREATININE: 1.3 mg/dL (ref 0.7–1.3)
Calcium: 9.3 mg/dL (ref 8.4–10.4)
Chloride: 105 mEq/L (ref 98–109)
EGFR: >60 mL/min/{1.73_m2} (ref >60)
GLUCOSE: 111 mg/dL (ref 70–140)
POTASSIUM: 4.4 meq/L (ref 3.5–5.1)
SODIUM: 140 meq/L (ref 136–145)
TOTAL PROTEIN: 7.3 g/dL (ref 6.4–8.3)
Total Bilirubin: 0.83 mg/dL (ref 0.20–1.20)

## 2017-05-03 MED ORDER — LEUCOVORIN CALCIUM INJECTION 350 MG
400.0000 mg/m2 | Freq: Once | INTRAVENOUS | Status: AC
  Administered 2017-05-03: 840 mg via INTRAVENOUS
  Filled 2017-05-03: qty 42

## 2017-05-03 MED ORDER — AMLODIPINE BESYLATE 10 MG PO TABS: 10.0000 mg | ORAL_TABLET | Freq: Every day | ORAL | 2 refills | Status: DC

## 2017-05-03 MED ORDER — SODIUM CHLORIDE 0.9 % IV SOLN
2200.0000 mg/m2 | INTRAVENOUS | Status: DC
  Administered 2017-05-03: 4600 mg via INTRAVENOUS
  Filled 2017-05-03: qty 92

## 2017-05-03 MED ORDER — PALONOSETRON HCL INJECTION 0.25 MG/5ML
0.2500 mg | Freq: Once | INTRAVENOUS | Status: AC
Start: 2017-05-03 — End: 2017-05-03
  Administered 2017-05-03: 0.25 mg via INTRAVENOUS

## 2017-05-03 MED ORDER — LISINOPRIL 10 MG PO TABS: 10.0000 mg | ORAL_TABLET | Freq: Every day | ORAL | 2 refills | Status: DC

## 2017-05-03 MED ORDER — SODIUM CHLORIDE 0.9 % IV SOLN
5.0000 mg/kg | Freq: Once | INTRAVENOUS | Status: AC
  Administered 2017-05-03: 450 mg via INTRAVENOUS
  Filled 2017-05-03: qty 2

## 2017-05-03 MED ORDER — SODIUM CHLORIDE 0.9% FLUSH
10.0000 mL | Freq: Once | INTRAVENOUS | Status: AC
  Administered 2017-05-03: 10 mL
  Filled 2017-05-03: qty 10

## 2017-05-03 MED ORDER — DEXAMETHASONE SODIUM PHOSPHATE 10 MG/ML IJ SOLN
INTRAMUSCULAR | Status: AC
  Filled 2017-05-03: qty 1

## 2017-05-03 MED ORDER — DEXAMETHASONE SODIUM PHOSPHATE 10 MG/ML IJ SOLN
8.0000 mg | Freq: Once | INTRAMUSCULAR | Status: AC
  Administered 2017-05-03: 8 mg via INTRAVENOUS

## 2017-05-03 MED ORDER — DEXTROSE 5 % IV SOLN: Freq: Once | INTRAVENOUS | Status: DC

## 2017-05-03 MED ORDER — PALONOSETRON HCL INJECTION 0.25 MG/5ML
INTRAVENOUS | Status: AC
  Filled 2017-05-03: qty 5

## 2017-05-03 NOTE — Patient Instructions (Signed)
[facility] Discharge Instructions for Patients Receiving Chemotherapy  Today you received the following chemotherapy agents: Avastin, Leucovorin, and 5FU  To help prevent nausea and vomiting after your treatment, we encourage you to take your nausea medication as directed   If you develop nausea and vomiting that is not controlled by your nausea medication, call the clinic.   BELOW ARE SYMPTOMS THAT SHOULD BE REPORTED IMMEDIATELY:  *FEVER GREATER THAN 100.5 F  *CHILLS WITH OR WITHOUT FEVER  NAUSEA AND VOMITING THAT IS NOT CONTROLLED WITH YOUR NAUSEA MEDICATION  *UNUSUAL SHORTNESS OF BREATH  *UNUSUAL BRUISING OR BLEEDING  TENDERNESS IN MOUTH AND THROAT WITH OR WITHOUT PRESENCE OF ULCERS  *URINARY PROBLEMS  *BOWEL PROBLEMS  UNUSUAL RASH Items with * indicate a potential emergency and should be followed up as soon as possible.  Feel free to call the clinic should you have any questions or concerns. The clinic phone number is [phone].  Please show the CHEMO ALERT CARD at check-in to the Emergency Department and triage nurse.   

## 2017-05-03 NOTE — Progress Notes (Signed)
Per [name], ok to change Leucovorin infusion time to 30 minutes every treatment. Maggie, PharmD notified via phone.

## 2017-05-05 ENCOUNTER — Ambulatory Visit (HOSPITAL_BASED_OUTPATIENT_CLINIC_OR_DEPARTMENT_OTHER): Payer: BLUE CROSS/BLUE SHIELD

## 2017-05-05 VITALS — BP 134/94 | HR 74 | Temp 97.6°F | Resp 18

## 2017-05-05 DIAGNOSIS — C787 Secondary malignant neoplasm of liver and intrahepatic bile duct: Principal | ICD-10-CM

## 2017-05-05 DIAGNOSIS — C182 Malignant neoplasm of ascending colon: Secondary | ICD-10-CM

## 2017-05-05 DIAGNOSIS — C189 Malignant neoplasm of colon, unspecified: Secondary | ICD-10-CM

## 2017-05-05 MED ORDER — HEPARIN SOD (PORK) LOCK FLUSH 100 UNIT/ML IV SOLN
500.0000 [IU] | Freq: Once | INTRAVENOUS | Status: AC | PRN
  Administered 2017-05-05: 500 [IU]
  Filled 2017-05-05: qty 5

## 2017-05-05 MED ORDER — SODIUM CHLORIDE 0.9% FLUSH
10.0000 mL | INTRAVENOUS | Status: DC | PRN
  Administered 2017-05-05: 10 mL
  Filled 2017-05-05: qty 10

## 2017-05-17 ENCOUNTER — Other Ambulatory Visit: Payer: BLUE CROSS/BLUE SHIELD

## 2017-05-17 ENCOUNTER — Ambulatory Visit (HOSPITAL_BASED_OUTPATIENT_CLINIC_OR_DEPARTMENT_OTHER): Payer: BLUE CROSS/BLUE SHIELD

## 2017-05-17 ENCOUNTER — Other Ambulatory Visit (HOSPITAL_BASED_OUTPATIENT_CLINIC_OR_DEPARTMENT_OTHER): Payer: BLUE CROSS/BLUE SHIELD

## 2017-05-17 ENCOUNTER — Ambulatory Visit: Payer: BLUE CROSS/BLUE SHIELD | Admitting: Hematology

## 2017-05-17 VITALS — BP 136/93 | HR 80 | Temp 97.4°F | Resp 20

## 2017-05-17 DIAGNOSIS — C189 Malignant neoplasm of colon, unspecified: Secondary | ICD-10-CM

## 2017-05-17 DIAGNOSIS — C182 Malignant neoplasm of ascending colon: Secondary | ICD-10-CM | POA: Diagnosis not present

## 2017-05-17 DIAGNOSIS — C787 Secondary malignant neoplasm of liver and intrahepatic bile duct: Principal | ICD-10-CM

## 2017-05-17 DIAGNOSIS — Z5111 Encounter for antineoplastic chemotherapy: Secondary | ICD-10-CM | POA: Diagnosis not present

## 2017-05-17 DIAGNOSIS — D6481 Anemia due to antineoplastic chemotherapy: Secondary | ICD-10-CM

## 2017-05-17 DIAGNOSIS — Z5112 Encounter for antineoplastic immunotherapy: Secondary | ICD-10-CM

## 2017-05-17 DIAGNOSIS — D500 Iron deficiency anemia secondary to blood loss (chronic): Secondary | ICD-10-CM

## 2017-05-17 LAB — COMPREHENSIVE METABOLIC PANEL
ALT: 16 U/L (ref 0–55)
AST: 17 U/L (ref 5–34)
Albumin: 4.1 g/dL (ref 3.5–5.0)
Alkaline Phosphatase: 44 U/L (ref 40–150)
Anion Gap: 7 mEq/L (ref 3–11)
BILIRUBIN TOTAL: 0.57 mg/dL (ref 0.20–1.20)
BUN: 16.8 mg/dL (ref 7.0–26.0)
CO2: 27 meq/L (ref 22–29)
CREATININE: 1.1 mg/dL (ref 0.7–1.3)
Calcium: 9.8 mg/dL (ref 8.4–10.4)
Chloride: 105 mEq/L (ref 98–109)
EGFR: >60 mL/min/{1.73_m2} (ref >60)
GLUCOSE: 86 mg/dL (ref 70–140)
Potassium: 4.9 mEq/L (ref 3.5–5.1)
SODIUM: 139 meq/L (ref 136–145)
TOTAL PROTEIN: 7 g/dL (ref 6.4–8.3)

## 2017-05-17 LAB — CEA (IN HOUSE-CHCC): CEA (CHCC-In House): 3.47 ng/mL (ref 0.00–5.00)

## 2017-05-17 LAB — CBC WITH DIFFERENTIAL/PLATELET
BASO%: 0.2 % (ref 0.0–2.0)
Basophils Absolute: 0 10*3/uL (ref 0.0–0.1)
EOS ABS: 0.1 10*3/uL (ref 0.0–0.5)
EOS%: 1.7 % (ref 0.0–7.0)
HCT: 40.5 % (ref 38.4–49.9)
HEMOGLOBIN: 13.4 g/dL (ref 13.0–17.1)
LYMPH%: 14.5 % (ref 14.0–49.0)
MCH: 32.1 pg (ref 27.2–33.4)
MCHC: 33.1 g/dL (ref 32.0–36.0)
MCV: 96.9 fL (ref 79.3–98.0)
MONO#: 0.5 10*3/uL (ref 0.1–0.9)
MONO%: 10.5 % (ref 0.0–14.0)
NEUT%: 73.1 % (ref 39.0–75.0)
NEUTROS ABS: 3.5 10*3/uL (ref 1.5–6.5)
PLATELETS: 119 10*3/uL — AB (ref 140–400)
RBC: 4.18 10*6/uL — AB (ref 4.20–5.82)
RDW: 15.3 % — ABNORMAL HIGH (ref 11.0–14.6)
WBC: 4.8 10*3/uL (ref 4.0–10.3)
lymph#: 0.7 10*3/uL — ABNORMAL LOW (ref 0.9–3.3)

## 2017-05-17 LAB — IRON AND TIBC
%SAT: 25 % (ref 20–55)
IRON: 89 ug/dL (ref 42–163)
TIBC: 353 ug/dL (ref 202–409)
UIBC: 264 ug/dL (ref 117–376)

## 2017-05-17 LAB — FERRITIN: FERRITIN: 42 ng/mL (ref 22–316)

## 2017-05-17 LAB — UA PROTEIN, DIPSTICK - CHCC: Protein, ur: NEGATIVE mg/dL

## 2017-05-17 MED ORDER — HEPARIN SOD (PORK) LOCK FLUSH 100 UNIT/ML IV SOLN
500.0000 [IU] | Freq: Once | INTRAVENOUS | Status: DC | PRN
  Filled 2017-05-17: qty 5

## 2017-05-17 MED ORDER — DEXAMETHASONE SODIUM PHOSPHATE 10 MG/ML IJ SOLN
8.0000 mg | Freq: Once | INTRAMUSCULAR | Status: AC
  Administered 2017-05-17: 8 mg via INTRAVENOUS

## 2017-05-17 MED ORDER — DEXTROSE 5 % IV SOLN: Freq: Once | INTRAVENOUS | Status: DC

## 2017-05-17 MED ORDER — PALONOSETRON HCL INJECTION 0.25 MG/5ML
INTRAVENOUS | Status: AC
  Filled 2017-05-17: qty 5

## 2017-05-17 MED ORDER — SODIUM CHLORIDE 0.9% FLUSH
10.0000 mL | INTRAVENOUS | Status: DC | PRN
  Filled 2017-05-17: qty 10

## 2017-05-17 MED ORDER — DEXAMETHASONE SODIUM PHOSPHATE 10 MG/ML IJ SOLN
INTRAMUSCULAR | Status: AC
  Filled 2017-05-17: qty 1

## 2017-05-17 MED ORDER — LEUCOVORIN CALCIUM INJECTION 350 MG
400.0000 mg/m2 | Freq: Once | INTRAVENOUS | Status: AC
  Administered 2017-05-17: 840 mg via INTRAVENOUS
  Filled 2017-05-17: qty 42

## 2017-05-17 MED ORDER — PALONOSETRON HCL INJECTION 0.25 MG/5ML
0.2500 mg | Freq: Once | INTRAVENOUS | Status: AC
  Administered 2017-05-17: 0.25 mg via INTRAVENOUS

## 2017-05-17 MED ORDER — SODIUM CHLORIDE 0.9 % IV SOLN
2200.0000 mg/m2 | INTRAVENOUS | Status: DC
  Administered 2017-05-17: 4600 mg via INTRAVENOUS
  Filled 2017-05-17: qty 92

## 2017-05-17 MED ORDER — SODIUM CHLORIDE 0.9 % IV SOLN
5.0000 mg/kg | Freq: Once | INTRAVENOUS | Status: AC
  Administered 2017-05-17: 450 mg via INTRAVENOUS
  Filled 2017-05-17: qty 2

## 2017-05-17 NOTE — Patient Instructions (Signed)
[facility] Discharge Instructions for Patients Receiving Chemotherapy  Today you received the following chemotherapy agents Avastin, Leucovorin and   To help prevent nausea and vomiting after your treatment, we encourage you to take your nausea medications as directed   If you develop nausea and vomiting that is not controlled by your nausea medication, call the clinic.   BELOW ARE SYMPTOMS THAT SHOULD BE REPORTED IMMEDIATELY:  *FEVER GREATER THAN 100.5 F  *CHILLS WITH OR WITHOUT FEVER  NAUSEA AND VOMITING THAT IS NOT CONTROLLED WITH YOUR NAUSEA MEDICATION  *UNUSUAL SHORTNESS OF BREATH  *UNUSUAL BRUISING OR BLEEDING  TENDERNESS IN MOUTH AND THROAT WITH OR WITHOUT PRESENCE OF ULCERS  *URINARY PROBLEMS  *BOWEL PROBLEMS  UNUSUAL RASH Items with * indicate a potential emergency and should be followed up as soon as possible.  Feel free to call the clinic should you have any questions or concerns. The clinic phone number is [phone].  Please show the [place] at check-in to the Emergency Department and triage nurse.

## 2017-05-19 ENCOUNTER — Ambulatory Visit (HOSPITAL_BASED_OUTPATIENT_CLINIC_OR_DEPARTMENT_OTHER): Payer: BLUE CROSS/BLUE SHIELD

## 2017-05-19 VITALS — BP 137/88 | HR 66 | Temp 97.7°F | Resp 18

## 2017-05-19 DIAGNOSIS — C182 Malignant neoplasm of ascending colon: Secondary | ICD-10-CM | POA: Diagnosis not present

## 2017-05-19 DIAGNOSIS — C189 Malignant neoplasm of colon, unspecified: Secondary | ICD-10-CM

## 2017-05-19 DIAGNOSIS — C787 Secondary malignant neoplasm of liver and intrahepatic bile duct: Principal | ICD-10-CM

## 2017-05-19 MED ORDER — SODIUM CHLORIDE 0.9% FLUSH
10.0000 mL | INTRAVENOUS | Status: DC | PRN
  Administered 2017-05-19: 10 mL
  Filled 2017-05-19: qty 10

## 2017-05-19 MED ORDER — HEPARIN SOD (PORK) LOCK FLUSH 100 UNIT/ML IV SOLN
500.0000 [IU] | Freq: Once | INTRAVENOUS | Status: AC | PRN
  Administered 2017-05-19: 500 [IU]
  Filled 2017-05-19: qty 5

## 2017-05-19 NOTE — Patient Instructions (Signed)
Implanted Port Home Guide An implanted port is a type of central line that is placed under the skin. Central lines are used to provide IV access when treatment or nutrition needs to be given through a person's veins. Implanted ports are used for long-term IV access. An implanted port may be placed because:  You need IV medicine that would be irritating to the small veins in your hands or arms.  You need long-term IV medicines, such as antibiotics.  You need IV nutrition for a long period.  You need frequent blood draws for lab tests.  You need dialysis.  Implanted ports are usually placed in the chest area, but they can also be placed in the upper arm, the abdomen, or the leg. An implanted port has two main parts:  Reservoir. The reservoir is round and will appear as a small, raised area under your skin. The reservoir is the part where a needle is inserted to give medicines or draw blood.  Catheter. The catheter is a thin, flexible tube that extends from the reservoir. The catheter is placed into a large vein. Medicine that is inserted into the reservoir goes into the catheter and then into the vein.  How will I care for my incision site? Do not get the incision site wet. Bathe or shower as directed by your health care provider. How is my port accessed? Special steps must be taken to access the port:  Before the port is accessed, a numbing cream can be placed on the skin. This helps numb the skin over the port site.  Your health care provider uses a sterile technique to access the port. ? Your health care provider must put on a mask and sterile gloves. ? The skin over your port is cleaned carefully with an antiseptic and allowed to dry. ? The port is gently pinched between sterile gloves, and a needle is inserted into the port.  Only "non-coring" port needles should be used to access the port. Once the port is accessed, a blood return should be checked. This helps ensure that the port  is in the vein and is not clogged.  If your port needs to remain accessed for a constant infusion, a clear (transparent) bandage will be placed over the needle site. The bandage and needle will need to be changed every week, or as directed by your health care provider.  Keep the bandage covering the needle clean and dry. Do not get it wet. Follow your health care provider's instructions on how to take a shower or bath while the port is accessed.  If your port does not need to stay accessed, no bandage is needed over the port.  What is flushing? Flushing helps keep the port from getting clogged. Follow your health care provider's instructions on how and when to flush the port. Ports are usually flushed with saline solution or a medicine called heparin. The need for flushing will depend on how the port is used.  If the port is used for intermittent medicines or blood draws, the port will need to be flushed: ? After medicines have been given. ? After blood has been drawn. ? As part of routine maintenance.  If a constant infusion is running, the port may not need to be flushed.  How long will my port stay implanted? The port can stay in for as long as your health care provider thinks it is needed. When it is time for the port to come out, surgery will be   done to remove it. The procedure is similar to the one performed when the port was put in. When should I seek immediate medical care? When you have an implanted port, you should seek immediate medical care if:  You notice a bad smell coming from the incision site.  You have swelling, redness, or drainage at the incision site.  You have more swelling or pain at the port site or the surrounding area.  You have a fever that is not controlled with medicine.  This information is not intended to replace advice given to you by your health care provider. Make sure you discuss any questions you have with your health care provider. Document  Released: 05/01/2005 Document Revised: 10/07/2015 Document Reviewed: 01/06/2013 Elsevier Interactive Patient Education  2017 [employer]  

## 2017-05-19 NOTE — Progress Notes (Signed)
Port Site WNL, Redness noted where dressing was located. Pt educated to use benadryl topical cream, pt verbalizes understanding and will call clinic with any concerns. Pt stable at discharge.

## 2017-05-23 ENCOUNTER — Encounter: Payer: Self-pay | Admitting: Hematology

## 2017-05-30 NOTE — Progress Notes (Signed)
[place]  [phone] [phone]  Clinic Follow up Note   Patient Care Team: Curlene Labrum, MD as PCP - General (Family Medicine) 05/31/2017  CHIEF COMPLAINT: Follow up metastatic right colon cancer  SUMMARY OF ONCOLOGIC HISTORY: Oncology History   Cancer Staging Metastatic colon cancer to liver [facility]) Staging form: Colon and Rectum, AJCC 8th Edition - Clinical stage from 06/01/2016: Stage IVA (cTX, cNX, pM1a) - Signed by Truitt Merle, MD on 07/04/2016 - Pathologic stage from 06/14/2016: Stage IVA (pT4b(m), pN2b, pM1a) - Signed by Truitt Merle, MD on 07/04/2016       Metastatic colon cancer to liver ([place])   04/2015 Procedure    Colonoscopy by Dr. Ladona Horns. It showed showed 2 sessile polyps ready between 3-5 mm in size located 20 cm (A, B) from the point of entry, polypectomy was performed. Pedunculated polyp was found in the ascending colon (C), polypectomy was performed, and additional polyp (D) was found 30 cm from the point of entry, removed      04/2015 Pathology Results    tubular adenoma (A and B), and well differentiated adenocarcinoma arising from tubulovillous adenoma (C) and well differentiated adenocarcinoma arising from severe dysplasia to intramucosal carcinoma within tubular adenoma.       04/2015 Initial Diagnosis    Metastatic colon cancer to liver ([place])      05/29/2016 Imaging    CT abdomen and pelvis with contrast showed an apple core like stricture in right colon just above the cecum, measuring 3.2 cm in lengths. This is highly suspicious for malignancy. Small lymph node a noticed he had adjacent mesentery, measuring 8 mm. There is a low-density lesion within the inferior aspect of the right hepatic lobe measuring 1.7 cm, suspicious for metastasis.       06/06/2016 Tumor Marker    CEA 9.99      06/08/2016 PET scan    IMPRESSION: Approximately 3 cm hypermetabolic mass in the ascending colon, consistent with primary colon  carcinoma. This mass results in colonic obstruction and small bowel dilatation. Additional areas of hypermetabolic wall thickening in the cecum may represent other sites of colon carcinoma or colitis. Mild hypermetabolic lymphadenopathy in right pericolonic region, porta hepatis, and aortocaval space, consistent with metastatic disease. Mild hypermetabolic mediastinal lymphadenopathy also seen, and thoracic lymph node metastases cannot be excluded. Solitary hypermetabolic focus in inferior right hepatic lobe, consistent with liver metastasis. Consider abdomen MRI without and with contrast for further evaluation.      06/14/2016 Surgery    Hand assisted right hemicolectomy and small bowel resection for colon cancer, liver biopsy, by Dr. Barry Dienes      06/14/2016 Pathology Results    Right hemicolectomy showed invasive well to moderately differentiated adenocarcinoma, 2 foci measuring 7.5 cm and 4.5 cm, tumor invades through full thickness of colon, to the seroma and involve the Small Bowel, Surgical Margins Were Negative, 24 Out Of 64 Lymph Nodes Were Positive, Extracapsular Extension Identified, Multiple Satellite Tumor Deposits Present, Liver Biopsy Showed Metastatic Adenocarcinoma.        06/14/2016 Miscellaneous    Tumor MMR normal, MSI stable       06/14/2016 Miscellaneous    Foundation one genomic testing showed K-ras G12 D mutation, APC and TP53 mutation. No BRAF and NRAS mutation. MSI-stable, tumor burden low.      07/06/2016 Tumor Marker    CEA 13.69      07/13/2016 - 01/18/2017 Chemotherapy    mFOLFOX, every 2 weeks, started on 07/14/2015,  Avastin added from cycle 3  Oxaliplatin dose to '60mg'$ /m2 due to side effects and some cytopenia on 09/21/16  Changed to FOLFIRINOX starting cycle 7 and Reduced  Due to neuropathy hold Oxaliplatin and add Irinotecan with neulasta on day 3 starting with cycle 7 Add low dose Oxaliplatin with cycle 8.  Due to his worsening neuropathy, and good response to  chemotherapy, I previously stopped oxaliplatin from cycle 11, and continue FOLFIRI and avastin         07/27/2016 Tumor Marker    CEA 15.85      09/04/2016 Imaging    Ct C/A/P W Contrast IMPRESSION: Interval right colectomy. Stable small liver metastasis in the inferior right hepatic lobe. Stable mild porta hepatis and aortocaval lymphadenopathy. Stable mild mediastinal lymphadenopathy. No new or progressive metastatic disease identified within the chest, abdomen, or pelvis.      12/19/2016 PET scan    IMPRESSION: 1. Right hemicolectomy, with resolution of the prior hypermetabolic activity inferiorly in the right hepatic lobe, in several mediastinal lymph nodes, and in lymph nodes in the retroperitoneum and porta hepatis. No residual hypermetabolic or enlarged lymph nodes are identified. 2. Low-grade diffuse skeletal metabolic activity is likely therapy related. 3. Coronary atherosclerosis. 4. 3 by 4 mm right middle lobe pulmonary nodule is stable, not appreciably hypermetabolic, but below sensitive PET-CT size thresholds. This may warrant surveillance.      01/11/2017 Imaging    MR Abdomen W WO Contrast IMPRESSION: 1. No acute findings within the abdomen. Previously noted liver metastasis has resolved in the interval. No new lesions.      02/01/2017 -  Chemotherapy    Maintenance therapy, Xeloda '2000mg'$  (1000 mg/m2)  q12h on day 1-14 every 21 days plus AVASTIN, starting 02/01/2017.  stopped after 12 days due to poor tolerance on 02/14/17  Changed to maintenance 5-FU and avastin every 2 weeks starting on 02/22/17        04/10/2017 Imaging    IMPRESSION: 1. Status post right hemicolectomy without findings for recurrent tumor. 2. No worrisome hepatic lesions. Treated disease with only a small residual low attenuation lesion in the right hepatic lobe. 3. No recurrent mediastinal or abdominal lymphadenopathy.      CURRENT THERAPY: Maintenance 5-Fu and Avastin started  01/04/2017, he tried one cycle Xeloda to replace 5-fu, but could not tolerate. Added leucovorin and decreased 5-Fu 10% starting on 03/15/17.  INTERVAL HISTORY: Mr. Recupero returns for follow up as scheduled and next cycle leucovorin/5FU/avastin; last treated 05/17/17. He is feeling well overall. Has decreased po intake for few days after treatment and occasional nausea controlled with ativan PRN. No vomiting. Has regular BM, no bleeding. Denies fever, chills, cough, dyspnea, or lower extremity swelling. Neuropathy in his feet is stable, did not notice a difference with B complex supplement. He has no specific complaints today.  REVIEW OF SYSTEMS:   Constitutional: Denies fatigue, fevers, chills or abnormal weight loss Eyes: Denies blurriness of vision Ears, nose, mouth, throat, and face: Denies mucositis or sore throat Respiratory: Denies cough, dyspnea or wheezes Cardiovascular: Denies palpitation, chest discomfort or lower extremity swelling Gastrointestinal:  Denies vomiting, constipation, diarrhea, heartburn, or blood in stool (+) intermittent nausea, controlled with ativan PRN Skin: Denies abnormal skin rashes Lymphatics: Denies new lymphadenopathy or easy bruising Neurological:Denies new weaknesses (+) neuropathy to feet, stable  Behavioral/Psych: Mood is stable, no new changes  All other systems were reviewed with the patient and are negative.  MEDICAL HISTORY:  Past Medical History:  Diagnosis Date  .  Anxiety   . Cancer of ascending colon ([place])   . Seasonal allergies     SURGICAL HISTORY: Past Surgical History:  Procedure Laterality Date  . APPENDECTOMY  1992  . CATARACT EXTRACTION W/ INTRAOCULAR LENS IMPLANT Left 04/2016  . COLON SURGERY    . COLONOSCOPY W/ BIOPSIES AND POLYPECTOMY  04/2015  . INGUINAL HERNIA REPAIR Right 1982  . IR RADIOLOGIST EVAL & MGMT  01/16/2017  . KNEE ARTHROSCOPY W/ MENISCECTOMY Right 1998  . LAPAROSCOPIC RIGHT COLECTOMY N/A 06/14/2016   Procedure:  LAPAROSCOPIC HAND ASSISTED HEMICOLECTOMY AND SMALL BOWEL RESECTION.;  Surgeon: [name], MD;  Location: [facility];  Service: General;  Laterality: N/A;  . LIVER BIOPSY Right 06/14/2016   Procedure: LIVER BIOPSY;  Surgeon: [name], MD;  Location: [facility];  Service: General;  Laterality: Right;  Right Inferior Liver  . PORTACATH PLACEMENT N/A 07/06/2016   Procedure: INSERTION PORT-A-CATH;  Surgeon: [name], MD;  Location: [facility];  Service: General;  Laterality: N/A;  . VASECTOMY  2005    I have reviewed the social history and family history with the patient and they are unchanged from previous note.  ALLERGIES:  is allergic to penicillins and xeloda [capecitabine].  MEDICATIONS:  Current Outpatient Medications  Medication Sig Dispense Refill  . LORazepam (ATIVAN) 0.5 MG tablet Take 1 tablet (0.5 mg total) by mouth every 8 (eight) hours as needed (for nausea). 30 tablet 0  . amLODipine (NORVASC) 10 MG tablet Take 1 tablet (10 mg total) by mouth daily. 30 tablet 2  . celecoxib (CELEBREX) 200 MG capsule Take 200 mg by mouth daily.  1  . dexamethasone (DECADRON) 4 MG tablet For 5 days after chemo (begin day 2) 10 tablet 0  . diphenoxylate-atropine (LOMOTIL) 2.5-0.025 MG tablet Take 2 tablets by mouth 4 (four) times daily as needed for diarrhea or loose stools. 30 tablet 0  . FLUoxetine (PROZAC) 40 MG capsule Take 40 mg by mouth daily.    Marland Kitchen lidocaine-prilocaine (EMLA) cream Apply to affected area once 30 g 3  . lisinopril (PRINIVIL) 10 MG tablet Take 1 tablet (10 mg total) by mouth daily. 90 tablet 2  . Multiple Vitamin (MULTIVITAMIN WITH MINERALS) TABS tablet Take 1 tablet by mouth daily.    . ondansetron (ZOFRAN ODT) 8 MG disintegrating tablet Take 1 tablet (8 mg total) by mouth every 8 (eight) hours as needed for nausea or vomiting. 30 tablet 2  . prochlorperazine (COMPAZINE) 10 MG tablet Take 1 tablet (10 mg total) by mouth every 6 (six) hours as needed for nausea or  vomiting. 30 tablet 2  . traMADol (ULTRAM) 50 MG tablet Take 1-2 tablets (50-100 mg total) by mouth every 6 (six) hours as needed for moderate pain or severe pain. (Patient not taking: Reported on 04/12/2017) 30 tablet 0  . urea (CARMOL) 10 % cream Apply topically 3 (three) times daily. (Patient not taking: Reported on 05/03/2017) 71 g 1   No current facility-administered medications for this visit.    Facility-Administered Medications Ordered in Other Visits  Medication Dose Route Frequency Provider Last Rate Last Dose  . 0.9 %  sodium chloride infusion   Intravenous Continuous Truitt Merle, MD 10 mL/hr at 05/31/17 1248    . clindamycin (CLEOCIN) 900 mg in dextrose 5 % 50 mL IVPB  900 mg Intravenous 60 min Pre-Op [name], MD       And  . gentamicin (GARAMYCIN) 450 mg in dextrose 5 % 50 mL IVPB  5 mg/kg Intravenous 60 min Pre-Op [name], MD      . dextrose 5 % solution   Intravenous Once Truitt Merle, MD      . fluorouracil (ADRUCIL) 4,600 mg in sodium chloride 0.9 % 58 mL chemo infusion  2,200 mg/m2 (Treatment Plan Recorded) Intravenous 1 day or 1 dose Truitt Merle, MD      . heparin lock flush 100 unit/mL  500 Units Intracatheter Once PRN Truitt Merle, MD      . sodium chloride flush (NS) 0.9 % injection 10 mL  10 mL Intracatheter PRN Truitt Merle, MD        PHYSICAL EXAMINATION: ECOG PERFORMANCE STATUS: 1 - Symptomatic but completely ambulatory  Vitals:   05/31/17 1134  BP: 119/84  Pulse: 65  Resp: 18  Temp: 97.9 F (36.6 C)  SpO2: 100%   Filed Weights   05/31/17 1134  Weight: 200 lb 12.8 oz (91.1 kg)    GENERAL:alert, no distress and comfortable SKIN: skin color, texture, turgor are normal, no rashes or significant lesions EYES: normal, Conjunctiva are pink and non-injected, sclera clear OROPHARYNX:no exudate, no erythema and lips, buccal mucosa, and tongue normal  NECK: supple, thyroid normal size, non-tender, without nodularity LYMPH:  no palpable cervical, supraclavicular,  axillary, or inguinal lymphadenopathy  LUNGS: clear to auscultation bilaterally with normal breathing effort HEART: regular rate & rhythm and no murmurs and no lower extremity edema ABDOMEN:abdomen soft, non-tender and normal bowel sounds. No hepatomegaly Musculoskeletal:no cyanosis of digits and no clubbing  NEURO: alert & oriented x 3 with fluent speech, no focal motor deficits (+) mildly decreased vibratory sense to feet bilaterally PAC without erythema  LABORATORY DATA:  I have reviewed the data as listed CBC Latest Ref Rng & Units 05/31/2017 05/17/2017 05/03/2017  WBC 4.0 - 10.3 K/uL 4.4 4.8 3.6(L)  Hemoglobin 13.0 - 17.1 g/dL 14.0 13.4 13.7  Hematocrit 38.4 - 49.9 % 41.0 40.5 40.4  Platelets 140 - 400 K/uL 127(L) 119(L) 149     CMP Latest Ref Rng & Units 05/31/2017 05/17/2017 05/03/2017  Glucose 70 - 140 mg/dL 77 86 111  BUN 7 - 26 mg/dL 19 16.8 14.7  Creatinine 0.70 - 1.30 mg/dL 1.14 1.1 1.3  Sodium 136 - 145 mmol/L 139 139 140  Potassium 3.5 - 5.1 mmol/L 4.5 4.9 4.4  Chloride 98 - 109 mmol/L 105 - -  CO2 22 - 29 mmol/L '25 27 25  '$ Calcium 8.4 - 10.4 mg/dL 9.7 9.8 9.3  Total Protein 6.4 - 8.3 g/dL 7.5 7.0 7.3  Total Bilirubin 0.2 - 1.2 mg/dL 0.5 0.57 0.83  Alkaline Phos 40 - 150 U/L 47 44 57  AST 5 - 34 U/L '18 17 23  '$ ALT 0 - 55 U/L '17 16 19   '$ PATHOLOGY   Diagnosis 06/14/2016 1. Colon, segmental resection for tumor, Right Ascending Hemicolectomy and Small Bowel - INVASIVE WELL TO MODERATELY DIFFERENTIATED ADENOCARCINOMA. - TWO TUMOR FOCI MEASURING 7.5 CM AND 4.5 CM IN GREATEST DIMENSION. - TUMOR INVADES THROUGH FULL THICKNESS OF COLON, THROUGH THE SEROSA TO INVOLVE THE SMALL BOWEL. - MARGINS ARE NEGATIVE. - TWENTY FOUR OF SIXTY FOUR LYMPH NODES POSITIVE FOR METASTATIC ADENOCARCINOMA (24/64). - EXTRACAPSULAR EXTENSION IDENTIFIED - MULTIPLE SATELLITE TUMOR DEPOSITS PRESENT. - SEE ONCOLOGY TEMPLATE. 2. Liver, needle/core biopsy, Right Inferior - POSITIVE FOR METASTATIC  ADENOCARCINOMA.      RADIOGRAPHIC STUDIES: I have personally reviewed the radiological images as listed and agreed with the findings in the report. No results found.  ASSESSMENT & PLAN: 57 y.o. Caucasian male, without significant past medical history, presented with rectal bleeding in December 2016, colonoscopy showed 4 polyps, 2 of them showed invasive adenocarcinoma, unfortunately he was not aware of the pathology findings and was not treated. He now presented with fatigue, weight loss, anorexia and abdominal pain  1. Right colon cancer with liver and node metastasis, pT4bN2bM1a, stage IV, MSI-stable, KRAS mutation (+) 2. HTN 3. Anemia of iron deficiency and chemotherapy 4. Anorexia and weight loss 5. Peripheral neuropathy, G1 6. Goal of care discussion  Mr. Weightman appears stable today, he completed another cycle of leucovorin/5FU/avastin on 05/17/17. He is tolerating well overall. VS and weight stable. Labs unremarkable. Will proceed with another cycle leuc/5FU/avastin today and continue q2 weeks. Will obtain restaging PET in approx 1 month, ordered today. Return in 2 weeks for f/u with [name] and next cycle.  PLAN -Labs reviewed, proceed with leucovorin/5FU/avastin today and q2 weeks -Return for f/u with [name] and next cycle in 2 weeks -Restaging PET in approx 1 month, ordered today   Orders Placed This Encounter  Procedures  . NM PET Image Restag (PS) Skull Base To Thigh    Standing Status:   Future    Standing Expiration Date:   05/31/2018    Order Specific Question:   If indicated for the ordered procedure, I authorize the administration of a radiopharmaceutical per Radiology protocol    Answer:   Yes    Order Specific Question:   Preferred imaging location?    Answer:   [facility]    Order Specific Question:   Radiology Contrast Protocol - do NOT remove file path    Answer:   [email or web address]    Order Specific Question:    Reason for Exam additional comments    Answer:   metastatic colon cancer ongoing chemotherapy, restaging   All questions were answered. The patient knows to call the clinic with any problems, questions or concerns. No barriers to learning was detected.   Alla Feeling, NP 05/31/17

## 2017-05-31 ENCOUNTER — Inpatient Hospital Stay: Payer: BLUE CROSS/BLUE SHIELD | Attending: Hematology

## 2017-05-31 ENCOUNTER — Other Ambulatory Visit: Payer: Self-pay | Admitting: Hematology

## 2017-05-31 ENCOUNTER — Inpatient Hospital Stay: Payer: BLUE CROSS/BLUE SHIELD

## 2017-05-31 ENCOUNTER — Encounter: Payer: Self-pay | Admitting: Nurse Practitioner

## 2017-05-31 ENCOUNTER — Inpatient Hospital Stay (HOSPITAL_BASED_OUTPATIENT_CLINIC_OR_DEPARTMENT_OTHER): Payer: BLUE CROSS/BLUE SHIELD | Admitting: Nurse Practitioner

## 2017-05-31 VITALS — BP 124/84 | HR 60 | Resp 18

## 2017-05-31 VITALS — BP 119/84 | HR 65 | Temp 97.9°F | Resp 18 | Ht 71.0 in | Wt 200.8 lb

## 2017-05-31 DIAGNOSIS — D509 Iron deficiency anemia, unspecified: Secondary | ICD-10-CM

## 2017-05-31 DIAGNOSIS — I2510 Atherosclerotic heart disease of native coronary artery without angina pectoris: Secondary | ICD-10-CM | POA: Insufficient documentation

## 2017-05-31 DIAGNOSIS — C787 Secondary malignant neoplasm of liver and intrahepatic bile duct: Secondary | ICD-10-CM

## 2017-05-31 DIAGNOSIS — R630 Anorexia: Secondary | ICD-10-CM | POA: Insufficient documentation

## 2017-05-31 DIAGNOSIS — D696 Thrombocytopenia, unspecified: Secondary | ICD-10-CM | POA: Diagnosis not present

## 2017-05-31 DIAGNOSIS — Z95828 Presence of other vascular implants and grafts: Secondary | ICD-10-CM

## 2017-05-31 DIAGNOSIS — Z9049 Acquired absence of other specified parts of digestive tract: Secondary | ICD-10-CM | POA: Insufficient documentation

## 2017-05-31 DIAGNOSIS — Z79899 Other long term (current) drug therapy: Secondary | ICD-10-CM | POA: Diagnosis not present

## 2017-05-31 DIAGNOSIS — F419 Anxiety disorder, unspecified: Secondary | ICD-10-CM | POA: Diagnosis not present

## 2017-05-31 DIAGNOSIS — C189 Malignant neoplasm of colon, unspecified: Secondary | ICD-10-CM

## 2017-05-31 DIAGNOSIS — I10 Essential (primary) hypertension: Secondary | ICD-10-CM

## 2017-05-31 DIAGNOSIS — R634 Abnormal weight loss: Secondary | ICD-10-CM | POA: Insufficient documentation

## 2017-05-31 DIAGNOSIS — Z5111 Encounter for antineoplastic chemotherapy: Secondary | ICD-10-CM | POA: Insufficient documentation

## 2017-05-31 DIAGNOSIS — C771 Secondary and unspecified malignant neoplasm of intrathoracic lymph nodes: Secondary | ICD-10-CM | POA: Insufficient documentation

## 2017-05-31 DIAGNOSIS — G629 Polyneuropathy, unspecified: Secondary | ICD-10-CM | POA: Insufficient documentation

## 2017-05-31 DIAGNOSIS — C182 Malignant neoplasm of ascending colon: Secondary | ICD-10-CM

## 2017-05-31 DIAGNOSIS — T451X5A Adverse effect of antineoplastic and immunosuppressive drugs, initial encounter: Secondary | ICD-10-CM

## 2017-05-31 DIAGNOSIS — G620 Drug-induced polyneuropathy: Secondary | ICD-10-CM

## 2017-05-31 LAB — CBC WITH DIFFERENTIAL/PLATELET
Basophils Absolute: 0 10*3/uL (ref 0.0–0.1)
Basophils Relative: 1 %
EOS ABS: 0.1 10*3/uL (ref 0.0–0.5)
EOS PCT: 2 %
HCT: 41 % (ref 38.4–49.9)
Hemoglobin: 14 g/dL (ref 13.0–17.1)
LYMPHS ABS: 0.8 10*3/uL — AB (ref 0.9–3.3)
Lymphocytes Relative: 18 %
MCH: 32.6 pg (ref 27.2–33.4)
MCHC: 34.1 g/dL (ref 32.0–36.0)
MCV: 95.3 fL (ref 79.3–98.0)
Monocytes Absolute: 0.5 10*3/uL (ref 0.1–0.9)
Monocytes Relative: 10 %
NEUTROS PCT: 69 %
Neutro Abs: 3.1 10*3/uL (ref 1.5–6.5)
PLATELETS: 127 10*3/uL — AB (ref 140–400)
RBC: 4.3 MIL/uL (ref 4.20–5.82)
RDW: 15.7 % — ABNORMAL HIGH (ref 11.0–15.6)
WBC: 4.4 10*3/uL (ref 4.0–10.3)

## 2017-05-31 LAB — COMPREHENSIVE METABOLIC PANEL
ALK PHOS: 47 U/L (ref 40–150)
ALT: 17 U/L (ref 0–55)
AST: 18 U/L (ref 5–34)
Albumin: 4.2 g/dL (ref 3.5–5.0)
Anion gap: 9 (ref 3–11)
BILIRUBIN TOTAL: 0.5 mg/dL (ref 0.2–1.2)
BUN: 19 mg/dL (ref 7–26)
CALCIUM: 9.7 mg/dL (ref 8.4–10.4)
CHLORIDE: 105 mmol/L (ref 98–109)
CO2: 25 mmol/L (ref 22–29)
CREATININE: 1.14 mg/dL (ref 0.70–1.30)
Glucose, Bld: 77 mg/dL (ref 70–140)
Potassium: 4.5 mmol/L (ref 3.5–5.1)
Sodium: 139 mmol/L (ref 136–145)
Total Protein: 7.5 g/dL (ref 6.4–8.3)

## 2017-05-31 MED ORDER — PALONOSETRON HCL INJECTION 0.25 MG/5ML
0.2500 mg | Freq: Once | INTRAVENOUS | Status: AC
  Administered 2017-05-31: 0.25 mg via INTRAVENOUS

## 2017-05-31 MED ORDER — DEXAMETHASONE SODIUM PHOSPHATE 10 MG/ML IJ SOLN
8.0000 mg | Freq: Once | INTRAMUSCULAR | Status: AC
  Administered 2017-05-31: 8 mg via INTRAVENOUS

## 2017-05-31 MED ORDER — SODIUM CHLORIDE 0.9 % IV SOLN
5.0000 mg/kg | Freq: Once | INTRAVENOUS | Status: AC
  Administered 2017-05-31: 450 mg via INTRAVENOUS
  Filled 2017-05-31: qty 16

## 2017-05-31 MED ORDER — SODIUM CHLORIDE 0.9% FLUSH
10.0000 mL | Freq: Once | INTRAVENOUS | Status: AC
  Administered 2017-05-31: 10 mL
  Filled 2017-05-31: qty 10

## 2017-05-31 MED ORDER — DEXAMETHASONE SODIUM PHOSPHATE 10 MG/ML IJ SOLN
INTRAMUSCULAR | Status: AC
  Filled 2017-05-31: qty 1

## 2017-05-31 MED ORDER — DEXTROSE 5 % IV SOLN: Freq: Once | INTRAVENOUS | Status: DC

## 2017-05-31 MED ORDER — FLUOROURACIL CHEMO INJECTION 5 GM/100ML
2200.0000 mg/m2 | INTRAVENOUS | Status: DC
  Administered 2017-05-31: 4600 mg via INTRAVENOUS
  Filled 2017-05-31: qty 92

## 2017-05-31 MED ORDER — LEUCOVORIN CALCIUM INJECTION 350 MG
400.0000 mg/m2 | Freq: Once | INTRAMUSCULAR | Status: AC
  Administered 2017-05-31: 840 mg via INTRAVENOUS
  Filled 2017-05-31: qty 42

## 2017-05-31 MED ORDER — SODIUM CHLORIDE 0.9 % IV SOLN
INTRAVENOUS | Status: DC
  Administered 2017-05-31: 12:00:00 via INTRAVENOUS

## 2017-05-31 MED ORDER — PALONOSETRON HCL INJECTION 0.25 MG/5ML
INTRAVENOUS | Status: AC
  Filled 2017-05-31: qty 5

## 2017-05-31 NOTE — Patient Instructions (Signed)
[facility] Discharge Instructions for Patients Receiving Chemotherapy  Today you received the following chemotherapy agents Avastin, Leucovorin and Adrucil.  To help prevent nausea and vomiting after your treatment, we encourage you to take your nausea medications as directed   If you develop nausea and vomiting that is not controlled by your nausea medication, call the clinic.   BELOW ARE SYMPTOMS THAT SHOULD BE REPORTED IMMEDIATELY:  *FEVER GREATER THAN 100.5 F  *CHILLS WITH OR WITHOUT FEVER  NAUSEA AND VOMITING THAT IS NOT CONTROLLED WITH YOUR NAUSEA MEDICATION  *UNUSUAL SHORTNESS OF BREATH  *UNUSUAL BRUISING OR BLEEDING  TENDERNESS IN MOUTH AND THROAT WITH OR WITHOUT PRESENCE OF ULCERS  *URINARY PROBLEMS  *BOWEL PROBLEMS  UNUSUAL RASH Items with * indicate a potential emergency and should be followed up as soon as possible.  Feel free to call the clinic should you have any questions or concerns. The clinic phone number is [phone].  Please show the [facility] at check-in to the Emergency Department and triage nurse.

## 2017-06-01 ENCOUNTER — Telehealth: Payer: Self-pay | Admitting: Hematology

## 2017-06-01 NOTE — Telephone Encounter (Signed)
Spoke to patient regarding upcoming January appointment updates per 1/17 sch message

## 2017-06-02 ENCOUNTER — Inpatient Hospital Stay: Payer: BLUE CROSS/BLUE SHIELD

## 2017-06-02 VITALS — BP 126/81 | HR 71 | Temp 97.5°F | Resp 16

## 2017-06-02 DIAGNOSIS — C787 Secondary malignant neoplasm of liver and intrahepatic bile duct: Principal | ICD-10-CM

## 2017-06-02 DIAGNOSIS — C182 Malignant neoplasm of ascending colon: Secondary | ICD-10-CM | POA: Diagnosis not present

## 2017-06-02 DIAGNOSIS — C189 Malignant neoplasm of colon, unspecified: Secondary | ICD-10-CM

## 2017-06-02 MED ORDER — HEPARIN SOD (PORK) LOCK FLUSH 100 UNIT/ML IV SOLN
500.0000 [IU] | Freq: Once | INTRAVENOUS | Status: AC | PRN
  Administered 2017-06-02: 500 [IU]
  Filled 2017-06-02: qty 5

## 2017-06-02 MED ORDER — SODIUM CHLORIDE 0.9% FLUSH
10.0000 mL | INTRAVENOUS | Status: DC | PRN
  Administered 2017-06-02: 10 mL
  Filled 2017-06-02: qty 10

## 2017-06-06 ENCOUNTER — Other Ambulatory Visit: Payer: Self-pay | Admitting: *Deleted

## 2017-06-06 MED ORDER — LORAZEPAM 0.5 MG PO TABS: 0.5000 mg | ORAL_TABLET | Freq: Three times a day (TID) | ORAL | 0 refills | Status: DC | PRN

## 2017-06-13 NOTE — Progress Notes (Signed)
[place]  [phone] [phone]  Clinic Follow Up Note  Patient Care Team: Curlene Labrum, MD as PCP - General (Family Medicine)   Date of Service:  06/14/2017   CHIEF COMPLAINTS:  Follow up metastatic right colon cancer   Oncology History   Cancer Staging Metastatic colon cancer to liver [facility]) Staging form: Colon and Rectum, AJCC 8th Edition - Clinical stage from 06/01/2016: Stage IVA (cTX, cNX, pM1a) - Signed by Truitt Merle, MD on 07/04/2016 - Pathologic stage from 06/14/2016: Stage IVA (pT4b(m), pN2b, pM1a) - Signed by Truitt Merle, MD on 07/04/2016       Metastatic colon cancer to liver ([place])   04/2015 Procedure    Colonoscopy by Dr. Ladona Horns. It showed showed 2 sessile polyps ready between 3-5 mm in size located 20 cm (A, B) from the point of entry, polypectomy was performed. Pedunculated polyp was found in the ascending colon (C), polypectomy was performed, and additional polyp (D) was found 30 cm from the point of entry, removed      04/2015 Pathology Results    tubular adenoma (A and B), and well differentiated adenocarcinoma arising from tubulovillous adenoma (C) and well differentiated adenocarcinoma arising from severe dysplasia to intramucosal carcinoma within tubular adenoma.       04/2015 Initial Diagnosis    Metastatic colon cancer to liver ([place])      05/29/2016 Imaging    CT abdomen and pelvis with contrast showed an apple core like stricture in right colon just above the cecum, measuring 3.2 cm in lengths. This is highly suspicious for malignancy. Small lymph node a noticed he had adjacent mesentery, measuring 8 mm. There is a low-density lesion within the inferior aspect of the right hepatic lobe measuring 1.7 cm, suspicious for metastasis.       06/06/2016 Tumor Marker    CEA 9.99      06/08/2016 PET scan    IMPRESSION: Approximately 3 cm hypermetabolic mass in the ascending colon, consistent with primary colon carcinoma.  This mass results in colonic obstruction and small bowel dilatation. Additional areas of hypermetabolic wall thickening in the cecum may represent other sites of colon carcinoma or colitis. Mild hypermetabolic lymphadenopathy in right pericolonic region, porta hepatis, and aortocaval space, consistent with metastatic disease. Mild hypermetabolic mediastinal lymphadenopathy also seen, and thoracic lymph node metastases cannot be excluded. Solitary hypermetabolic focus in inferior right hepatic lobe, consistent with liver metastasis. Consider abdomen MRI without and with contrast for further evaluation.      06/14/2016 Surgery    Hand assisted right hemicolectomy and small bowel resection for colon cancer, liver biopsy, by Dr. Barry Dienes      06/14/2016 Pathology Results    Right hemicolectomy showed invasive well to moderately differentiated adenocarcinoma, 2 foci measuring 7.5 cm and 4.5 cm, tumor invades through full thickness of colon, to the seroma and involve the Small Bowel, Surgical Margins Were Negative, 24 Out Of 64 Lymph Nodes Were Positive, Extracapsular Extension Identified, Multiple Satellite Tumor Deposits Present, Liver Biopsy Showed Metastatic Adenocarcinoma.        06/14/2016 Miscellaneous    Tumor MMR normal, MSI stable       06/14/2016 Miscellaneous    Foundation one genomic testing showed K-ras G12 D mutation, APC and TP53 mutation. No BRAF and NRAS mutation. MSI-stable, tumor burden low.      07/06/2016 Tumor Marker    CEA 13.69      07/13/2016 - 01/18/2017 Chemotherapy    mFOLFOX, every 2  weeks, started on 07/14/2015, Avastin added from cycle 3  Oxaliplatin dose to 81m/m2 due to side effects and some cytopenia on 09/21/16  Changed to FOLFIRINOX starting cycle 7 and Reduced  Due to neuropathy hold Oxaliplatin and add Irinotecan with neulasta on day 3 starting with cycle 7 Add low dose Oxaliplatin with cycle 8.  Due to his worsening neuropathy, and good response to  chemotherapy, I previously stopped oxaliplatin from cycle 11, and continue FOLFIRI and avastin         07/27/2016 Tumor Marker    CEA 15.85      09/04/2016 Imaging    Ct C/A/P W Contrast IMPRESSION: Interval right colectomy. Stable small liver metastasis in the inferior right hepatic lobe. Stable mild porta hepatis and aortocaval lymphadenopathy. Stable mild mediastinal lymphadenopathy. No new or progressive metastatic disease identified within the chest, abdomen, or pelvis.      12/19/2016 PET scan    IMPRESSION: 1. Right hemicolectomy, with resolution of the prior hypermetabolic activity inferiorly in the right hepatic lobe, in several mediastinal lymph nodes, and in lymph nodes in the retroperitoneum and porta hepatis. No residual hypermetabolic or enlarged lymph nodes are identified. 2. Low-grade diffuse skeletal metabolic activity is likely therapy related. 3. Coronary atherosclerosis. 4. 3 by 4 mm right middle lobe pulmonary nodule is stable, not appreciably hypermetabolic, but below sensitive PET-CT size thresholds. This may warrant surveillance.      01/11/2017 Imaging    MR Abdomen W WO Contrast IMPRESSION: 1. No acute findings within the abdomen. Previously noted liver metastasis has resolved in the interval. No new lesions.      02/01/2017 -  Chemotherapy    Maintenance therapy, Xeloda 20021m(1000 mg/m2)  q12h on day 1-14 every 21 days plus AVASTIN, starting 02/01/2017.  stopped after 12 days due to poor tolerance on 02/14/17  Changed to maintenance 5-FU and avastin every 2 weeks starting on 02/22/17        04/10/2017 Imaging    IMPRESSION: 1. Status post right hemicolectomy without findings for recurrent tumor. 2. No worrisome hepatic lesions. Treated disease with only a small residual low attenuation lesion in the right hepatic lobe. 3. No recurrent mediastinal or abdominal lymphadenopathy.        HISTORY OF PRESENTING ILLNESS (06/01/2016):  Nicholas Mccaughey57.o. male is here because of his recently abdominal CT which is highly suspicious for metastatic colon cancer. He is accompanied by his wife to my clinic today. He was referred by his primary care physician Dr. BuPleas Koch  He had colonoscopy in 2016 for mild rectal bleeding, he describe small amount fresh blood mixed with stool, he has no other constitutional symptoms at that time. He was referred to gastroenterologist Dr. CaLadona Hornsnd underwent a colonoscopy in December 2016. The colonoscopy showed 2 sessile polyps ready between 3-5 mm in size located 20 cm (A, B) from the point of entry, polypectomy was performed. Pedunculated polyp was found in the ascending colon (C), polypectomy was performed, and additional polyp (D) was found 30 cm from the point of entry, removed. The pathology reviewed tubular adenoma (A and B), and well differentiated adenocarcinoma arising from tubulovillous adenoma (C) and well differentiated adenocarcinoma arising from severe dysplasia to intramucosal carcinoma within tubular adenoma. Dr. CaTye Marylandried multiple times to reach patient, but patient sought they were calling him about the bill, and did not return the phone calls. He was not aware the cancer diagnosis until recently.   He started having diarrhea and vomiting  in mid Dec 2017, and felt a "pop" in right side abdomen, he was seen at urgent care, and was treated with antiemetics, and IVF, lab tests were OK. Due to his persistent intermittent diarrhea and epigastric pain since then, he was seen by PCP and he eventually had CT abdomen and pelvis scan which showed a upper core lesion in the ascending colon, and I'll 1.7 cm lesion in the liver, highly suspicious for metastasis. He was referred to [place] for further evaluation.  He has lost 30 lbs in the past one month, has low appetite, eats a small meals 1-2 times a day. He has moderate fatigue, able to tolerate routine activities including his work, but feels exhausted at  the end of study. He has occasional constipation, denies recent rectal bleeding.  CURRENT THERAPY: Maintenance 5-Fu and Avastin started 01/04/2017, he tried one cycle Xeloda to replace 5-fu, but could not tolerate. Added leucovorin and decreased 5-Fu 10% starting on 03/15/17.  INTERIM HISTORY:  [name] returns today for follow up and treatment. He presents to the clinic today noting he is tolerating his treatment and managing 5-fu pump well. He notes the inconvenience of every 2 week regimen and wonders can he spread out treatment or options of clinical trails.   On review of symptoms, pt notes he felt mildly nauseous before treatment today. No vomiting. Neuropathy is stable.      MEDICAL HISTORY:  Past Medical History:  Diagnosis Date  . Anxiety   . Cancer of ascending colon ([place])   . Seasonal allergies     SURGICAL HISTORY: Past Surgical History:  Procedure Laterality Date  . APPENDECTOMY  1992  . CATARACT EXTRACTION W/ INTRAOCULAR LENS IMPLANT Left 04/2016  . COLON SURGERY    . COLONOSCOPY W/ BIOPSIES AND POLYPECTOMY  04/2015  . INGUINAL HERNIA REPAIR Right 1982  . IR RADIOLOGIST EVAL & MGMT  01/16/2017  . KNEE ARTHROSCOPY W/ MENISCECTOMY Right 1998  . LAPAROSCOPIC RIGHT COLECTOMY N/A 06/14/2016   Procedure: LAPAROSCOPIC HAND ASSISTED HEMICOLECTOMY AND SMALL BOWEL RESECTION.;  Surgeon: [name], MD;  Location: [place];  Service: General;  Laterality: N/A;  . LIVER BIOPSY Right 06/14/2016   Procedure: LIVER BIOPSY;  Surgeon: [name], MD;  Location: [place];  Service: General;  Laterality: Right;  Right Inferior Liver  . PORTACATH PLACEMENT N/A 07/06/2016   Procedure: INSERTION PORT-A-CATH;  Surgeon: [name], MD;  Location: [place];  Service: General;  Laterality: N/A;  . VASECTOMY  2005    SOCIAL HISTORY: Social History   Socioeconomic History  . Marital status: Married    Spouse name: Not on file  . Number of children: Not on file  . Years of  education: Not on file  . Highest education level: Not on file  Social Needs  . Financial resource strain: Not on file  . Food insecurity - worry: Not on file  . Food insecurity - inability: Not on file  . Transportation needs - medical: Not on file  . Transportation needs - non-medical: Not on file  Occupational History  . Not on file  Tobacco Use  . Smoking status: Former Smoker    Years: 2.00    Types: Cigarettes    Last attempt to quit: 1990    Years since quitting: 29.1  . Smokeless tobacco: Never Used  Substance and Sexual Activity  . Alcohol use: Yes    Alcohol/week: 2.4 oz    Types: 4 Cans of beer per week  Comment: social, none since colon surgery  . Drug use: No    Comment: 06/15/2016 "nothing since college"  . Sexual activity: Yes  Other Topics Concern  . Not on file  Social History Narrative  . Not on file   He is married. They have 3 boys, 64-12 yo. He works for a [employer], desk job.   FAMILY HISTORY: Family History  Problem Relation Age of Onset  . Cancer Mother        lung cancer  . Stroke Mother   . Hypertension Father   . CAD Father   . Cancer Maternal Grandfather        prostate cancer     ALLERGIES:  is allergic to penicillins and xeloda [capecitabine].  MEDICATIONS:  Current Outpatient Medications  Medication Sig Dispense Refill  . amLODipine (NORVASC) 10 MG tablet Take 1 tablet (10 mg total) by mouth daily. 30 tablet 2  . celecoxib (CELEBREX) 200 MG capsule Take 200 mg by mouth daily.  1  . diphenoxylate-atropine (LOMOTIL) 2.5-0.025 MG tablet Take 2 tablets by mouth 4 (four) times daily as needed for diarrhea or loose stools. 30 tablet 0  . FLUoxetine (PROZAC) 40 MG capsule Take 40 mg by mouth daily.    Marland Kitchen lidocaine-prilocaine (EMLA) cream Apply to affected area once 30 g 3  . lisinopril (PRINIVIL) 10 MG tablet Take 1 tablet (10 mg total) by mouth daily. 90 tablet 2  . LORazepam (ATIVAN) 0.5 MG tablet Take 1 tablet (0.5 mg total)  by mouth every 8 (eight) hours as needed (for nausea). 30 tablet 0  . Multiple Vitamin (MULTIVITAMIN WITH MINERALS) TABS tablet Take 1 tablet by mouth daily.    . ondansetron (ZOFRAN ODT) 8 MG disintegrating tablet Take 1 tablet (8 mg total) by mouth every 8 (eight) hours as needed for nausea or vomiting. 30 tablet 2  . prochlorperazine (COMPAZINE) 10 MG tablet Take 1 tablet (10 mg total) by mouth every 6 (six) hours as needed for nausea or vomiting. 30 tablet 2  . traMADol (ULTRAM) 50 MG tablet Take 1-2 tablets (50-100 mg total) by mouth every 6 (six) hours as needed for moderate pain or severe pain. 30 tablet 0  . urea (CARMOL) 10 % cream Apply topically 3 (three) times daily. 71 g 1  . dexamethasone (DECADRON) 4 MG tablet For 5 days after chemo (begin day 2) (Patient not taking: Reported on 06/14/2017) 10 tablet 0   No current facility-administered medications for this visit.    Facility-Administered Medications Ordered in Other Visits  Medication Dose Route Frequency Provider Last Rate Last Dose  . bevacizumab (AVASTIN) 450 mg in sodium chloride 0.9 % 100 mL chemo infusion  5 mg/kg (Treatment Plan Recorded) Intravenous Once Truitt Merle, MD      . clindamycin (CLEOCIN) 900 mg in dextrose 5 % 50 mL IVPB  900 mg Intravenous 60 min Pre-Op [name], MD       And  . gentamicin (GARAMYCIN) 450 mg in dextrose 5 % 50 mL IVPB  5 mg/kg Intravenous 60 min Pre-Op [name], MD      . dexamethasone (DECADRON) injection 8 mg  8 mg Intravenous Once Truitt Merle, MD      . fluorouracil (ADRUCIL) 4,600 mg in sodium chloride 0.9 % 58 mL chemo infusion  2,200 mg/m2 (Treatment Plan Recorded) Intravenous 1 day or 1 dose Truitt Merle, MD      . heparin lock flush 100 unit/mL  500 Units Intracatheter Once PRN Burr Medico,  Krista Blue, MD      . leucovorin 840 mg in dextrose 5 % 250 mL infusion  400 mg/m2 (Treatment Plan Recorded) Intravenous Once Truitt Merle, MD      . palonosetron Leona Carry) injection 0.25 mg  0.25 mg Intravenous Once  Truitt Merle, MD      . sodium chloride flush (NS) 0.9 % injection 10 mL  10 mL Intracatheter PRN Truitt Merle, MD         REVIEW OF SYSTEMS:  Constitutional: Denies fevers, chills or abnormal night sweats  Eyes: Denies blurriness of vision, double vision or watery eyes Ears, nose, mouth, throat, and face: negative Respiratory:negative  Cardiovascular: Denies palpitation, chest discomfort or lower extremity swelling Gastrointestinal:  Denies heartburn or change in bowel habits (+) mild nausea Skin:normal Lymphatics: Denies new lymphadenopathy or easy bruising Neurological: (+) residual numbness/tingling to hands, stable  MSK: Muscle weakness in hands Behavioral/Psych: Mood is stable, no new changes  All other systems were reviewed with the patient and are negative.  PHYSICAL EXAMINATION:  ECOG PERFORMANCE STATUS: 1 - Symptomatic but completely ambulatory  Vitals:   06/14/17 1119  BP: 118/76  Pulse: 79  Resp: 17  Temp: 98.1 F (36.7 C)  TempSrc: Oral  SpO2: 99%  Weight: 205 lb 8 oz (93.2 kg)  Height: '5\' 11"'$  (1.803 m)     GENERAL:alert, no distress and comfortable. SKIN: skin color, texture, turgor are normal, no rashes or significant lesions EYES: normal, conjunctiva are pink and non-injected, sclera clear OROPHARYNX:no exudate, no erythema and lips, buccal mucosa, and tongue normal   NECK: supple, thyroid normal size, non-tender, without nodularity LYMPH:  no palpable lymphadenopathy in the cervical, axillary or inguinal LUNGS: clear to auscultation and percussion with normal breathing effort HEART: regular rate & rhythm and no murmurs and no lower extremity edema ABDOMEN:abdomen soft, Surgical scar in the midline around the umbilical has healed well. non-tender and normal bowel sounds Musculoskeletal:no cyanosis of digits and no clubbing  PSYCH: alert & oriented x 3 with fluent speech NEURO: no focal motor/sensory deficits (+) mildly decreased bilateral vibration sensation in  feet  LABORATORY DATA:  I have reviewed the data as listed CBC Latest Ref Rng & Units 06/14/2017 05/31/2017 05/17/2017  WBC 4.0 - 10.3 K/uL 5.3 4.4 4.8  Hemoglobin 13.0 - 17.1 g/dL 13.0 14.0 13.4  Hematocrit 38.4 - 49.9 % 38.8 41.0 40.5  Platelets 140 - 400 K/uL 125(L) 127(L) 119(L)   CMP Latest Ref Rng & Units 06/14/2017 05/31/2017 05/17/2017  Glucose 70 - 140 mg/dL 80 77 86  BUN 7 - 26 mg/dL 19 19 16.8  Creatinine 0.70 - 1.30 mg/dL 1.13 1.14 1.1  Sodium 136 - 145 mmol/L 138 139 139  Potassium 3.5 - 5.1 mmol/L 4.6 4.5 4.9  Chloride 98 - 109 mmol/L 105 105 -  CO2 22 - 29 mmol/L '25 25 27  '$ Calcium 8.4 - 10.4 mg/dL 9.0 9.7 9.8  Total Protein 6.4 - 8.3 g/dL 6.9 7.5 7.0  Total Bilirubin 0.2 - 1.2 mg/dL 0.6 0.5 0.57  Alkaline Phos 40 - 150 U/L 50 47 44  AST 5 - 34 U/L '18 18 17  '$ ALT 0 - 55 U/L '14 17 16   '$ PATHOLOGY   Diagnosis 06/14/2016 1. Colon, segmental resection for tumor, Right Ascending Hemicolectomy and Small Bowel - INVASIVE WELL TO MODERATELY DIFFERENTIATED ADENOCARCINOMA. - TWO TUMOR FOCI MEASURING 7.5 CM AND 4.5 CM IN GREATEST DIMENSION. - TUMOR INVADES THROUGH FULL THICKNESS OF COLON, THROUGH THE SEROSA TO INVOLVE  THE SMALL BOWEL. - MARGINS ARE NEGATIVE. - TWENTY FOUR OF SIXTY FOUR LYMPH NODES POSITIVE FOR METASTATIC ADENOCARCINOMA (24/64). - EXTRACAPSULAR EXTENSION IDENTIFIED - MULTIPLE SATELLITE TUMOR DEPOSITS PRESENT. - SEE ONCOLOGY TEMPLATE. 2. Liver, needle/core biopsy, Right Inferior - POSITIVE FOR METASTATIC ADENOCARCINOMA.    RADIOGRAPHIC STUDIES: I have personally reviewed his outside CT scan from 05/29/2016 and agreed with the findings in the report. No results found. MR Abdomen W WO Contrast 01/11/2017 IMPRESSION: 1. No acute findings within the abdomen. Previously noted liver metastasis has resolved in the interval. No new lesions.  CT CAP 09/04/16 IMPRESSION: Interval right colectomy. Stable small liver metastasis in the inferior right hepatic  lobe. Stable mild porta hepatis and aortocaval lymphadenopathy. Stable mild mediastinal lymphadenopathy. No new or progressive metastatic disease identified within the chest, abdomen, or pelvis.  ASSESSMENT & PLAN:  57 y.o. Caucasian male, without significant past medical history, presented with rectal bleeding in December 2016, colonoscopy showed 4 polyps, 2 of them showed invasive adenocarcinoma, unfortunately he was not aware of the pathology findings and was not treated. He now presented with fatigue, weight loss, anorexia and abdominal pain  1. Right colon cancer with liver and node metastasis, pT4bN2bM1a, stage IV, MSI-stable, KRAS mutation (+) -I previously reviewed her PET scan findings, which showed solitary liver metastasis, abdominal and possible thoracic node metastasis  -His liver biopsy confirmed metastasis -Due to the bowel obstruction, he underwent upfront hemicolectomy. I previously reviewed his surgical pathology findings, which showed 2 primary right colon cancer, very locally advanced with 24 lymph nodes positive, surgical margins were negative. -I previously discussed his Foundation one genomic testing results, which showed care arrest mutation, and as high stable, low tumor burden. So he would not benefit from EGFR inhibitor, or immunotherapy alone. -The was started on palliative first-line chemotherapy FOLFOX. Avastin was added from cycle 3 -He has solitary liver metastasis, but multiple mild hypermetabolic adenopathy in mediastinum and abdomen. We previously discussed the possible local therapy options, such as liver metastectomy or ablation if he has great response to chemo and his node metastasis resolves  -restaging CT from 09/04/2016 reviewed with pt, stable disease. Will continue chemo -he developed side effects from FOLFOX, and a worsening thrombocytopenia, despite reduced dose of oxaliplatin  -Due to his overall limited response to FOLFOX, and his wish to pursue  surgery, I previously recommend him to change chemo to FOLFIRINOX with low dose  oxaliplatin, hopefully he will response better.  -Due to his severe nausea and vomiting, anorexia, mild diarrhea, I'll reduce his irinotecan dose from cycle 10  - PET scan from 12/19/2016 reviewed in person. He had near complete metabolic response from chemotherapy, post his liver metastasis and hypermetabolic lymph nodes are negative on PET scan with decreased size.  -Due to his worsening neuropathy, and good response to chemotherapy, I previously stopped oxaliplatin from cycle 11, and continue FOLFIRI and avastin   - I previously presented his case to the GI Tumor Board and discuss liver targeted therapy vs resection. Given lymph node metastasis, I favored liver ablation by interventional radiology.  -Reviewed his restaging abdominal MRI findings, which showed no visible lesions in the liver. He has had a complete radiographic response -He was seen by interventional radiologist Dr. Earleen Newport, who does not think he need liver ablation since the solid liver met has resolved after chemo. I agree with his plan.  -He was recently seen by surgeon Dr. Barry Dienes, who recommend maintenance chemotherapy for now, no indication for resection at this point  given -Given his excellent response to chemotherapy, and the poor tolerance to intravenous chemotherapy, I recommend him to change treatments to maintenance therapy -He started Xeloda and Avastin maintenance therapy 02/01/17, due to very poor tolerance he stopped Xeloda 02/14/17 -I suggest he returns to 5-Fu with avastin for maintenance therapy every 2 weeks since he tolerated this much better. I recommend he continue for at least 3 more months. I also discussed the option of weekly 5-Fu bolus with leucovorin infusion. He agreed to proceed with 5-Fu pump and avastin (02/22/17). -He has improving mucositis but developed significant skin toxicity to his feet.  -I recommended adding leucovorin  and decreasing 5FU dose 10% to decrease further skin toxicity starting 03/15/17. He is tolerating this well  -Patient would like to come off chemo if possible.  I suggest another 3-4 months maintenance therapy and repeat a scan before discussing stopping chemo. He agreed.   -The patient favors to take chemo break if the next PET scan is completely negative.  He understands his cancer is probably not cured, and possible disease progression after stopping chemo. -We again discussed continuing current maintenance therapy regimen. He is fine to have chemo break when needed.   -Labs reviewed, plt at 125K, lymphocyte count at 0.8, urine protein negative, Overall adequate to proceed with treatment today.  -He is tolerating treatment well. Will see him on every other cycle.  -repeat CT CAP on 2/26 or 2/28 -F/u in 4 weeks   2. Hypertension -His blood pressure has been high lately, I have previously given him clonidine 0.1 mg in the infusion room before Avastin -We previously discussed Avastin can cause hypertension -I have started him on amlodipine, and increase dose to 10 mg daily -He does not want to follow-up with his primary care physician. -Previously, his blood pressure was noted to be elevated, especially diastolic. I previously added on lisinopril 10 mg daily for him -Patient blood pressure noted to be 128/79 on 01/18/2017. -Continue Norvasc and Lisinopril for now while on Avastin -Will continue to monitor -BP controlled with Lisinopril and amlodipine, refilled today   3. Anemia of iron deficiency and chemo  -He has mild anemia, likely related to his colon cancer bleeding. -I previously recommend him to take oral iron supplement over-the-counter, potential side effects of constipation and gastric or discomfort or discussed with him. He stopped taking this before surgery. -I previously advised him to begin taking oral iron supplements again. - Iron levels previously on 08/24/16 at 46. -Iron levels  previously on 01/04/17 at 92. -03/15/17 iron study normal, Hg 12.1 -Hg stable at 12.2 today (03/29/17) -resolved and stable   4. Anorexia and weight loss -Secondary to underlying malignancy -I previously encouraged him to try nutritional supplement, such as boost or initial and try to eat more -The patient has lost some weight recently -weight gain lately.   5. Peripheral neuropathy, G1 -Secondary to oxaliplatin, dose reduced and ultimately discontinued -Slightly worse with FOLFIRI, held Irinotecan beginning 01/04/17 -Improved some lately, his hand function is normal, no balance issue, discussed with patient this may take some time to resolve.  - I strongly encouraged him to take vitamin B-complex supplements -Will continue to monitor.  6. Goal of care discussion  -We previously discussed the incurable nature of his cancer, and the overall poor prognosis, especially if he does not have good response to chemotherapy or progress on chemo -The patient understands the goal of care is palliative.  -He is full code now  7. 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   Plan  -Labs reviewed and adequate to proceed with treatment 5-FU/LV and avastin today  -CT CAP with contrast on 2/26 or 2/28  -Lab, flush and chemo 5-FU/LV and avastin in 2 and 4 weeks -F/u with Lacie in 2 weeks and with me in 4 weeks   No orders of the defined types were placed in this encounter.   All questions were answered.   The patient knows to call the clinic with any problems, questions or concerns.  I spent 20 minutes counseling the patient face to face. The total time spent in the appointment was 25 minutes and more than 50% was on counseling.  This document serves as a record of services personally performed by Truitt Merle, MD. It was created on her behalf by Joslyn Devon, a trained medical scribe. The creation of this record is based on the scribe's personal  observations and the provider's statements to them.    I have reviewed the above documentation for accuracy and completeness, and I agree with the above.    Truitt Merle  06/14/2017

## 2017-06-14 ENCOUNTER — Encounter: Payer: Self-pay | Admitting: Hematology

## 2017-06-14 ENCOUNTER — Inpatient Hospital Stay: Payer: BLUE CROSS/BLUE SHIELD

## 2017-06-14 ENCOUNTER — Inpatient Hospital Stay (HOSPITAL_BASED_OUTPATIENT_CLINIC_OR_DEPARTMENT_OTHER): Payer: BLUE CROSS/BLUE SHIELD | Admitting: Hematology

## 2017-06-14 VITALS — BP 118/76 | HR 79 | Temp 98.1°F | Resp 17 | Ht 71.0 in | Wt 205.5 lb

## 2017-06-14 VITALS — BP 125/86 | HR 64 | Resp 17

## 2017-06-14 DIAGNOSIS — C189 Malignant neoplasm of colon, unspecified: Secondary | ICD-10-CM

## 2017-06-14 DIAGNOSIS — D509 Iron deficiency anemia, unspecified: Secondary | ICD-10-CM

## 2017-06-14 DIAGNOSIS — R630 Anorexia: Secondary | ICD-10-CM | POA: Diagnosis not present

## 2017-06-14 DIAGNOSIS — C182 Malignant neoplasm of ascending colon: Secondary | ICD-10-CM | POA: Diagnosis not present

## 2017-06-14 DIAGNOSIS — D696 Thrombocytopenia, unspecified: Secondary | ICD-10-CM

## 2017-06-14 DIAGNOSIS — C771 Secondary and unspecified malignant neoplasm of intrathoracic lymph nodes: Secondary | ICD-10-CM | POA: Diagnosis not present

## 2017-06-14 DIAGNOSIS — G629 Polyneuropathy, unspecified: Secondary | ICD-10-CM

## 2017-06-14 DIAGNOSIS — D500 Iron deficiency anemia secondary to blood loss (chronic): Secondary | ICD-10-CM

## 2017-06-14 DIAGNOSIS — Z79899 Other long term (current) drug therapy: Secondary | ICD-10-CM

## 2017-06-14 DIAGNOSIS — Z95828 Presence of other vascular implants and grafts: Secondary | ICD-10-CM

## 2017-06-14 DIAGNOSIS — C787 Secondary malignant neoplasm of liver and intrahepatic bile duct: Secondary | ICD-10-CM

## 2017-06-14 DIAGNOSIS — R634 Abnormal weight loss: Secondary | ICD-10-CM

## 2017-06-14 LAB — COMPREHENSIVE METABOLIC PANEL
ALBUMIN: 3.8 g/dL (ref 3.5–5.0)
ALT: 14 U/L (ref 0–55)
AST: 18 U/L (ref 5–34)
Alkaline Phosphatase: 50 U/L (ref 40–150)
Anion gap: 8 (ref 3–11)
BUN: 19 mg/dL (ref 7–26)
CHLORIDE: 105 mmol/L (ref 98–109)
CO2: 25 mmol/L (ref 22–29)
CREATININE: 1.13 mg/dL (ref 0.70–1.30)
Calcium: 9 mg/dL (ref 8.4–10.4)
GFR calc Af Amer: >60 mL/min (ref >60)
Glucose, Bld: 80 mg/dL (ref 70–140)
Potassium: 4.6 mmol/L (ref 3.5–5.1)
Sodium: 138 mmol/L (ref 136–145)
Total Bilirubin: 0.6 mg/dL (ref 0.2–1.2)
Total Protein: 6.9 g/dL (ref 6.4–8.3)

## 2017-06-14 LAB — CBC WITH DIFFERENTIAL/PLATELET
BASOS ABS: 0 10*3/uL (ref 0.0–0.1)
BASOS PCT: 0 %
EOS ABS: 0.1 10*3/uL (ref 0.0–0.5)
Eosinophils Relative: 3 %
HCT: 38.8 % (ref 38.4–49.9)
Hemoglobin: 13 g/dL (ref 13.0–17.1)
Lymphocytes Relative: 15 %
Lymphs Abs: 0.8 10*3/uL — ABNORMAL LOW (ref 0.9–3.3)
MCH: 32.3 pg (ref 27.2–33.4)
MCHC: 33.5 g/dL (ref 32.0–36.0)
MCV: 96.5 fL (ref 79.3–98.0)
MONO ABS: 0.7 10*3/uL (ref 0.1–0.9)
Monocytes Relative: 12 %
Neutro Abs: 3.7 10*3/uL (ref 1.5–6.5)
Neutrophils Relative %: 70 %
PLATELETS: 125 10*3/uL — AB (ref 140–400)
RBC: 4.02 MIL/uL — ABNORMAL LOW (ref 4.20–5.82)
RDW: 16.5 % — AB (ref 11.0–14.6)
WBC: 5.3 10*3/uL (ref 4.0–10.3)

## 2017-06-14 LAB — FERRITIN: FERRITIN: 61 ng/mL (ref 22–316)

## 2017-06-14 LAB — IRON AND TIBC
Iron: 134 ug/dL (ref 42–163)
SATURATION RATIOS: 38 % — AB (ref 42–163)
TIBC: 348 ug/dL (ref 202–409)
UIBC: 214 ug/dL

## 2017-06-14 LAB — UA PROTEIN, DIPSTICK - CHCC: Protein, ur: NEGATIVE mg/dL

## 2017-06-14 LAB — CEA (IN HOUSE-CHCC): CEA (CHCC-In House): 4.08 ng/mL (ref 0.00–5.00)

## 2017-06-14 MED ORDER — DEXTROSE 5 % IV SOLN
400.0000 mg/m2 | Freq: Once | INTRAVENOUS | Status: AC
  Administered 2017-06-14: 840 mg via INTRAVENOUS
  Filled 2017-06-14: qty 42

## 2017-06-14 MED ORDER — SODIUM CHLORIDE 0.9 % IV SOLN
5.0000 mg/kg | Freq: Once | INTRAVENOUS | Status: AC
  Administered 2017-06-14: 450 mg via INTRAVENOUS
  Filled 2017-06-14: qty 16

## 2017-06-14 MED ORDER — SODIUM CHLORIDE 0.9 % IV SOLN
INTRAVENOUS | Status: DC
  Administered 2017-06-14: 12:00:00 via INTRAVENOUS

## 2017-06-14 MED ORDER — DEXAMETHASONE SODIUM PHOSPHATE 10 MG/ML IJ SOLN
INTRAMUSCULAR | Status: AC
  Filled 2017-06-14: qty 1

## 2017-06-14 MED ORDER — SODIUM CHLORIDE 0.9 % IV SOLN
2200.0000 mg/m2 | INTRAVENOUS | Status: DC
  Administered 2017-06-14: 4600 mg via INTRAVENOUS
  Filled 2017-06-14: qty 92

## 2017-06-14 MED ORDER — PALONOSETRON HCL INJECTION 0.25 MG/5ML
0.2500 mg | Freq: Once | INTRAVENOUS | Status: AC
  Administered 2017-06-14: 0.25 mg via INTRAVENOUS

## 2017-06-14 MED ORDER — SODIUM CHLORIDE 0.9% FLUSH
10.0000 mL | Freq: Once | INTRAVENOUS | Status: AC
  Administered 2017-06-14: 10 mL
  Filled 2017-06-14: qty 10

## 2017-06-14 MED ORDER — DEXAMETHASONE SODIUM PHOSPHATE 10 MG/ML IJ SOLN
8.0000 mg | Freq: Once | INTRAMUSCULAR | Status: AC
  Administered 2017-06-14: 8 mg via INTRAVENOUS

## 2017-06-14 MED ORDER — PALONOSETRON HCL INJECTION 0.25 MG/5ML
INTRAVENOUS | Status: AC
Start: 2017-06-14 — End: 2017-06-14
  Filled 2017-06-14: qty 5

## 2017-06-14 NOTE — Patient Instructions (Signed)
[facility] Discharge Instructions for Patients Receiving Chemotherapy  Today you received the following chemotherapy agents Avastin, Leucovorin and Adrucil.  To help prevent nausea and vomiting after your treatment, we encourage you to take your nausea medications as directed   If you develop nausea and vomiting that is not controlled by your nausea medication, call the clinic.   BELOW ARE SYMPTOMS THAT SHOULD BE REPORTED IMMEDIATELY:  *FEVER GREATER THAN 100.5 F  *CHILLS WITH OR WITHOUT FEVER  NAUSEA AND VOMITING THAT IS NOT CONTROLLED WITH YOUR NAUSEA MEDICATION  *UNUSUAL SHORTNESS OF BREATH  *UNUSUAL BRUISING OR BLEEDING  TENDERNESS IN MOUTH AND THROAT WITH OR WITHOUT PRESENCE OF ULCERS  *URINARY PROBLEMS  *BOWEL PROBLEMS  UNUSUAL RASH Items with * indicate a potential emergency and should be followed up as soon as possible.  Feel free to call the clinic should you have any questions or concerns. The clinic phone number is [phone].  Please show the [facility] at check-in to the Emergency Department and triage nurse.

## 2017-06-16 ENCOUNTER — Inpatient Hospital Stay: Payer: BLUE CROSS/BLUE SHIELD | Attending: Hematology

## 2017-06-16 VITALS — BP 139/95 | HR 82 | Temp 97.9°F | Resp 18

## 2017-06-16 DIAGNOSIS — D696 Thrombocytopenia, unspecified: Secondary | ICD-10-CM | POA: Insufficient documentation

## 2017-06-16 DIAGNOSIS — C771 Secondary and unspecified malignant neoplasm of intrathoracic lymph nodes: Secondary | ICD-10-CM | POA: Diagnosis not present

## 2017-06-16 DIAGNOSIS — E860 Dehydration: Secondary | ICD-10-CM | POA: Insufficient documentation

## 2017-06-16 DIAGNOSIS — R911 Solitary pulmonary nodule: Secondary | ICD-10-CM | POA: Insufficient documentation

## 2017-06-16 DIAGNOSIS — Z9049 Acquired absence of other specified parts of digestive tract: Secondary | ICD-10-CM | POA: Diagnosis not present

## 2017-06-16 DIAGNOSIS — F419 Anxiety disorder, unspecified: Secondary | ICD-10-CM | POA: Diagnosis not present

## 2017-06-16 DIAGNOSIS — C787 Secondary malignant neoplasm of liver and intrahepatic bile duct: Secondary | ICD-10-CM | POA: Diagnosis not present

## 2017-06-16 DIAGNOSIS — R630 Anorexia: Secondary | ICD-10-CM | POA: Insufficient documentation

## 2017-06-16 DIAGNOSIS — G629 Polyneuropathy, unspecified: Secondary | ICD-10-CM | POA: Insufficient documentation

## 2017-06-16 DIAGNOSIS — C189 Malignant neoplasm of colon, unspecified: Secondary | ICD-10-CM

## 2017-06-16 DIAGNOSIS — D500 Iron deficiency anemia secondary to blood loss (chronic): Secondary | ICD-10-CM | POA: Diagnosis not present

## 2017-06-16 DIAGNOSIS — F329 Major depressive disorder, single episode, unspecified: Secondary | ICD-10-CM | POA: Diagnosis not present

## 2017-06-16 DIAGNOSIS — Z5111 Encounter for antineoplastic chemotherapy: Secondary | ICD-10-CM | POA: Insufficient documentation

## 2017-06-16 DIAGNOSIS — R634 Abnormal weight loss: Secondary | ICD-10-CM | POA: Insufficient documentation

## 2017-06-16 DIAGNOSIS — G4700 Insomnia, unspecified: Secondary | ICD-10-CM | POA: Insufficient documentation

## 2017-06-16 DIAGNOSIS — C182 Malignant neoplasm of ascending colon: Secondary | ICD-10-CM | POA: Diagnosis present

## 2017-06-16 DIAGNOSIS — Z87891 Personal history of nicotine dependence: Secondary | ICD-10-CM | POA: Diagnosis not present

## 2017-06-16 DIAGNOSIS — I10 Essential (primary) hypertension: Secondary | ICD-10-CM | POA: Diagnosis not present

## 2017-06-16 DIAGNOSIS — Z79899 Other long term (current) drug therapy: Secondary | ICD-10-CM | POA: Insufficient documentation

## 2017-06-16 DIAGNOSIS — D509 Iron deficiency anemia, unspecified: Secondary | ICD-10-CM | POA: Diagnosis not present

## 2017-06-16 DIAGNOSIS — R45851 Suicidal ideations: Secondary | ICD-10-CM | POA: Diagnosis not present

## 2017-06-16 DIAGNOSIS — R590 Localized enlarged lymph nodes: Secondary | ICD-10-CM | POA: Diagnosis not present

## 2017-06-16 DIAGNOSIS — H269 Unspecified cataract: Secondary | ICD-10-CM | POA: Insufficient documentation

## 2017-06-16 MED ORDER — HEPARIN SOD (PORK) LOCK FLUSH 100 UNIT/ML IV SOLN
500.0000 [IU] | Freq: Once | INTRAVENOUS | Status: AC | PRN
  Administered 2017-06-16: 500 [IU]
  Filled 2017-06-16: qty 5

## 2017-06-16 MED ORDER — PROCHLORPERAZINE MALEATE 10 MG PO TABS: 10.0000 mg | ORAL_TABLET | Freq: Four times a day (QID) | ORAL | 3 refills | Status: DC | PRN

## 2017-06-16 MED ORDER — PROCHLORPERAZINE MALEATE 10 MG PO TABS: 10.0000 mg | ORAL_TABLET | Freq: Four times a day (QID) | ORAL | 2 refills | Status: DC | PRN

## 2017-06-16 MED ORDER — SODIUM CHLORIDE 0.9% FLUSH
10.0000 mL | INTRAVENOUS | Status: DC | PRN
  Administered 2017-06-16: 10 mL
  Filled 2017-06-16: qty 10

## 2017-06-18 ENCOUNTER — Other Ambulatory Visit: Payer: Self-pay | Admitting: *Deleted

## 2017-06-18 ENCOUNTER — Encounter: Payer: Self-pay | Admitting: Hematology

## 2017-06-18 ENCOUNTER — Telehealth: Payer: Self-pay | Admitting: *Deleted

## 2017-06-18 ENCOUNTER — Inpatient Hospital Stay: Payer: BLUE CROSS/BLUE SHIELD

## 2017-06-18 ENCOUNTER — Inpatient Hospital Stay (HOSPITAL_BASED_OUTPATIENT_CLINIC_OR_DEPARTMENT_OTHER): Payer: BLUE CROSS/BLUE SHIELD | Admitting: Medical

## 2017-06-18 VITALS — BP 120/93 | HR 87 | Temp 98.0°F | Resp 18 | Ht 71.0 in | Wt 194.7 lb

## 2017-06-18 DIAGNOSIS — C182 Malignant neoplasm of ascending colon: Secondary | ICD-10-CM

## 2017-06-18 DIAGNOSIS — C189 Malignant neoplasm of colon, unspecified: Secondary | ICD-10-CM

## 2017-06-18 DIAGNOSIS — Z95828 Presence of other vascular implants and grafts: Secondary | ICD-10-CM

## 2017-06-18 DIAGNOSIS — C787 Secondary malignant neoplasm of liver and intrahepatic bile duct: Secondary | ICD-10-CM

## 2017-06-18 DIAGNOSIS — Z79899 Other long term (current) drug therapy: Secondary | ICD-10-CM

## 2017-06-18 DIAGNOSIS — E860 Dehydration: Secondary | ICD-10-CM

## 2017-06-18 LAB — CMP (CANCER CENTER ONLY)
ALK PHOS: 57 U/L (ref 40–150)
ALT: 22 U/L (ref 0–55)
ANION GAP: 10 (ref 3–11)
AST: 24 U/L (ref 5–34)
Albumin: 4.5 g/dL (ref 3.5–5.0)
BILIRUBIN TOTAL: 1.5 mg/dL — AB (ref 0.2–1.2)
BUN: 24 mg/dL (ref 7–26)
CALCIUM: 9.7 mg/dL (ref 8.4–10.4)
CO2: 26 mmol/L (ref 22–29)
Chloride: 102 mmol/L (ref 98–109)
Creatinine: 1.49 mg/dL — ABNORMAL HIGH (ref 0.70–1.30)
GFR, EST AFRICAN AMERICAN: 60 mL/min — AB (ref >60)
GFR, EST NON AFRICAN AMERICAN: 52 mL/min — AB (ref >60)
Glucose, Bld: 94 mg/dL (ref 70–140)
Potassium: 4.5 mmol/L (ref 3.5–5.1)
Sodium: 138 mmol/L (ref 136–145)
TOTAL PROTEIN: 7.8 g/dL (ref 6.4–8.3)

## 2017-06-18 LAB — CBC WITH DIFFERENTIAL (CANCER CENTER ONLY)
BASOS ABS: 0 10*3/uL (ref 0.0–0.1)
BASOS PCT: 0 %
Eosinophils Absolute: 0.1 10*3/uL (ref 0.0–0.5)
Eosinophils Relative: 2 %
HEMATOCRIT: 44.1 % (ref 38.4–49.9)
HEMOGLOBIN: 15 g/dL (ref 13.0–17.1)
Lymphocytes Relative: 23 %
Lymphs Abs: 1.1 10*3/uL (ref 0.9–3.3)
MCH: 32.5 pg (ref 27.2–33.4)
MCHC: 34 g/dL (ref 32.0–36.0)
MCV: 95.7 fL (ref 79.3–98.0)
Monocytes Absolute: 0.5 10*3/uL (ref 0.1–0.9)
Monocytes Relative: 11 %
NEUTROS ABS: 3 10*3/uL (ref 1.5–6.5)
NEUTROS PCT: 64 %
Platelet Count: 146 10*3/uL (ref 140–400)
RBC: 4.61 MIL/uL (ref 4.20–5.82)
RDW: 16.3 % — ABNORMAL HIGH (ref 11.0–14.6)
WBC: 4.6 10*3/uL (ref 4.0–10.3)

## 2017-06-18 MED ORDER — HEPARIN SOD (PORK) LOCK FLUSH 100 UNIT/ML IV SOLN
500.0000 [IU] | Freq: Once | INTRAVENOUS | Status: AC
  Administered 2017-06-18: 500 [IU]
  Filled 2017-06-18: qty 5

## 2017-06-18 MED ORDER — SODIUM CHLORIDE 0.9% FLUSH
10.0000 mL | Freq: Once | INTRAVENOUS | Status: AC
  Administered 2017-06-18: 10 mL
  Filled 2017-06-18: qty 10

## 2017-06-18 MED ORDER — SODIUM CHLORIDE 0.9 % IV SOLN
Freq: Once | INTRAVENOUS | Status: AC
  Administered 2017-06-18: 12:00:00 via INTRAVENOUS

## 2017-06-18 NOTE — Telephone Encounter (Signed)
Reviewed MyChart message from wife today.  Lucianne Lei, [facility] notified.  Spoke with pt and instructed pt to come in now for labs and visit with [facility], and for possible IVF after visit.  Pt voiced understanding, and stated he is en route.

## 2017-06-19 NOTE — Progress Notes (Signed)
[facility] Progress Note   Nicholas Lowery [id number] [birth year] 57 y.o.  Nicholas Lowery is managed by Nicholas Lowery  Actively treated with chemotherapy: yes  Current Therapy: Fluorouracil, leucovorin and Avastin  Last Treated: 06/14/2017  Assessment: Plan:    Dehydration - Plan: 0.9 %  sodium chloride infusion  Port catheter in place - Plan: heparin lock flush 100 unit/mL, sodium chloride flush (NS) 0.9 % injection 10 mL  Metastatic colon cancer to liver ([place]) - Plan: heparin lock flush 100 unit/mL, sodium chloride flush (NS) 0.9 % injection 10 mL   Dehydration: CBC and a chemistry panel were collected today and reviewed.  The results showed creatinine slightly elevated at 1.49 and a bilirubin slightly elevated at 1.5.  Patient was given 1 L of normal saline.  He is scheduled to have restaging CT scans around the end of February.  He is to follow-up with Nicholas Lowery on 06/28/2017.  Metastatic colon cancer with metastatic disease to the liver: Nicholas Lowery is status post cycle 21-day 1 of fluorouracil, leucovorin, and Avastin last dosed on 06/14/2017.  The patient is to follow-up with Nicholas Lowery on 06/28/2017 with plans for restaging CT scans around the end of February.  I will speak with Nicholas Lowery and ask if she would like to add a head CT to the patient's restaging study based on his report of mild hand twitching for the past couple of weeks.  Please see After Visit Summary for patient specific instructions.  Future Appointments  Date Time Provider [place]  06/28/2017  8:45 AM [facility] LAB 1 [facility] None  06/28/2017  9:00 AM [facility] E14 [facility] None  06/28/2017  9:30 AM Nicholas Feeling, NP [facility] None  06/28/2017 10:30 AM [facility] B5 [facility] None  06/30/2017 10:30 AM [facility] INJ NURSE [facility] None  07/12/2017  9:15 AM [facility] LAB 4 [facility] None  07/12/2017  9:30 AM [facility] PROCEDURE 1 [facility] None    07/12/2017 10:00 AM Nicholas Merle, MD [facility] None  07/12/2017 11:00 AM [facility] F19 [facility] None  07/14/2017  1:00 PM [facility] INJ NURSE [facility] None    No orders of the defined types were placed in this encounter.      Subjective:   Patient ID:  Nicholas Lowery is a 57 y.o. (DOB 02-27-1961) male.  Chief Complaint:  Chief Complaint  Patient presents with  . Emesis    HPI Nicholas Lowery is a 57 year old male with a diagnosis of a metastatic colon cancer with metastatic disease to the liver. He is status post cycle 21-day 1 of fluorouracil, leucovorin, and Avastin last dosed on 06/14/2017.  He is to follow-up with Nicholas Lowery on 06/28/2017 with plans for restaging CT scans around the end of February.  He acknowledges mild anorexia following chemotherapy.  He had episodes of nausea and vomiting over the weekend with at least 3 episodes on Saturday and 5 episodes on Sunday.  He had 3 episodes today.  He notes mild dizziness today.  He is having chills but no fevers.  He also reports having ongoing night sweats which he has had for a couple of months.  He had a headache last Thursday.  It is resolved spontaneously.  He continues to have normal bowel movement with no change in the color of his stools.  His urine is concentrated.  He reports noting that his hands twitch at times.  This is been going on for "a couple of weeks".  He denies any visual changes.  He  has a history of cataracts and note halos around bright lights.  This is not new.   Medications: I have reviewed the patient's current medications.  Allergies:  Allergies  Allergen Reactions  . Penicillins Other (See Comments)    UNSPECIFIED REACTION   Has patient had a PCN reaction causing immediate rash, facial/tongue/throat swelling, SOB or lightheadedness with hypotension: unknown Has patient had a PCN reaction causing severe rash involving mucus membranes or skin necrosis: unknown Has patient had a PCN  reaction that required hospitalization unknown Has patient had a PCN reaction occurring within the last 10 years: No If all of the above answers are "NO", then may proceed with Cephalosporin use.   Nicholas Lowery [Capecitabine] Other (See Comments)    Foot pain, couldn't walk, bleeding gums, vomiting    Past Medical History:  Diagnosis Date  . Anxiety   . Cancer of ascending colon ([place])   . Seasonal allergies     Past Surgical History:  Procedure Laterality Date  . APPENDECTOMY  1992  . CATARACT EXTRACTION W/ INTRAOCULAR LENS IMPLANT Left 04/2016  . COLON SURGERY    . COLONOSCOPY W/ BIOPSIES AND POLYPECTOMY  04/2015  . INGUINAL HERNIA REPAIR Right 1982  . IR RADIOLOGIST EVAL & MGMT  01/16/2017  . KNEE ARTHROSCOPY W/ MENISCECTOMY Right 1998  . LAPAROSCOPIC RIGHT COLECTOMY N/A 06/14/2016   Procedure: LAPAROSCOPIC HAND ASSISTED HEMICOLECTOMY AND SMALL BOWEL RESECTION.;  Surgeon: Nicholas Klein, MD;  Location: [facility];  Service: General;  Laterality: N/A;  . LIVER BIOPSY Right 06/14/2016   Procedure: LIVER BIOPSY;  Surgeon: Nicholas Klein, MD;  Location: [facility];  Service: General;  Laterality: Right;  Right Inferior Liver  . PORTACATH PLACEMENT N/A 07/06/2016   Procedure: INSERTION PORT-A-CATH;  Surgeon: Nicholas Klein, MD;  Location: [facility];  Service: General;  Laterality: N/A;  . VASECTOMY  2005    Family History  Problem Relation Age of Onset  . Cancer Mother        lung cancer  . Stroke Mother   . Hypertension Father   . CAD Father   . Cancer Maternal Grandfather        prostate cancer     Social History   Socioeconomic History  . Marital status: Married    Spouse name: Not on file  . Number of children: Not on file  . Years of education: Not on file  . Highest education level: Not on file  Social Needs  . Financial resource strain: Not on file  . Food insecurity - worry: Not on file  . Food insecurity - inability: Not on file  . Transportation needs - medical:  Not on file  . Transportation needs - non-medical: Not on file  Occupational History  . Not on file  Tobacco Use  . Smoking status: Former Smoker    Years: 2.00    Types: Cigarettes    Last attempt to quit: 1990    Years since quitting: 29.1  . Smokeless tobacco: Never Used  Substance and Sexual Activity  . Alcohol use: Yes    Alcohol/week: 2.4 oz    Types: 4 Cans of beer per week    Comment: social, none since colon surgery  . Drug use: No    Comment: 06/15/2016 "nothing since college"  . Sexual activity: Yes  Other Topics Concern  . Not on file  Social History Narrative  . Not on file    Past Medical History, Surgical history, Social history, and Family  history were reviewed and updated as appropriate.   Please see review of systems for further details on the patient's review from today.   Review of Systems:  Review of Systems  Constitutional: Positive for appetite change, chills, diaphoresis and fatigue. Negative for fever.  HENT: Negative for trouble swallowing.   Eyes: Negative for visual disturbance.  Respiratory: Negative for cough, choking, chest tightness, shortness of breath and wheezing.   Cardiovascular: Negative for chest pain and palpitations.  Gastrointestinal: Positive for diarrhea, nausea and vomiting. Negative for abdominal pain and constipation.  Genitourinary: Negative for decreased urine volume, difficulty urinating and dysuria.  Skin: Negative for color change.  Neurological: Positive for dizziness and headaches.    Objective:   Physical Exam:  BP (!) 120/93 (BP Location: Left Arm, Patient Position: Sitting)   Pulse 87   Temp 98 F (36.7 C) (Oral)   Resp 18   Ht 5\' 11"  (1.803 m)   Wt 194 lb 11.2 oz (88.3 kg)   SpO2 99%   BMI 27.16 kg/m  ECOG: 0  Physical Exam  Constitutional: No distress.  HENT:  Head: Normocephalic and atraumatic.  Mouth/Throat: No oropharyngeal exudate.  Mucous membranes appear dry  Eyes: Right eye exhibits no  discharge. Left eye exhibits no discharge. No scleral icterus.  Cardiovascular: Normal rate, regular rhythm and normal heart sounds. Exam reveals no gallop and no friction rub.  No murmur heard. Pulmonary/Chest: Effort normal and breath sounds normal. No respiratory distress. He has no wheezes. He has no rales.  Abdominal: Soft. Bowel sounds are normal. He exhibits no distension and no mass. There is no tenderness. There is no rebound and no guarding.  Musculoskeletal: He exhibits no edema.  Neurological: He is alert. Coordination normal.  Skin: Skin is warm and dry. No rash noted. He is not diaphoretic. No erythema.  Increased tenting is noted over the dorsal surfaces of the hands bilaterally.  Psychiatric: He has a normal mood and affect. His behavior is normal. Judgment and thought content normal.    Lab Review:     Component Value Date/Time   NA 138 06/18/2017 1030   NA 139 05/17/2017 0931   K 4.5 06/18/2017 1030   K 4.9 05/17/2017 0931   CL 102 06/18/2017 1030   CO2 26 06/18/2017 1030   CO2 27 05/17/2017 0931   GLUCOSE 94 06/18/2017 1030   GLUCOSE 86 05/17/2017 0931   BUN 24 06/18/2017 1030   BUN 16.8 05/17/2017 0931   CREATININE 1.13 06/14/2017 1019   CREATININE 1.1 05/17/2017 0931   CALCIUM 9.7 06/18/2017 1030   CALCIUM 9.8 05/17/2017 0931   PROT 7.8 06/18/2017 1030   PROT 7.0 05/17/2017 0931   ALBUMIN 4.5 06/18/2017 1030   ALBUMIN 4.1 05/17/2017 0931   AST 24 06/18/2017 1030   AST 17 05/17/2017 0931   ALT 22 06/18/2017 1030   ALT 16 05/17/2017 0931   ALKPHOS 57 06/18/2017 1030   ALKPHOS 44 05/17/2017 0931   BILITOT 1.5 (H) 06/18/2017 1030   BILITOT 0.57 05/17/2017 0931   GFRNONAA 52 (L) 06/18/2017 1030   GFRAA 60 (L) 06/18/2017 1030       Component Value Date/Time   WBC 4.6 06/18/2017 1030   WBC 5.3 06/14/2017 1019   RBC 4.61 06/18/2017 1030   HGB 13.0 06/14/2017 1019   HGB 13.4 05/17/2017 0931   HCT 44.1 06/18/2017 1030   HCT 40.5 05/17/2017 0931   PLT  146 06/18/2017 1030   PLT 119 (L) 05/17/2017 6073  MCV 95.7 06/18/2017 1030   MCV 96.9 05/17/2017 0931   MCH 32.5 06/18/2017 1030   MCHC 34.0 06/18/2017 1030   RDW 16.3 (H) 06/18/2017 1030   RDW 15.3 (H) 05/17/2017 0931   LYMPHSABS 1.1 06/18/2017 1030   LYMPHSABS 0.7 (L) 05/17/2017 0931   MONOABS 0.5 06/18/2017 1030   MONOABS 0.5 05/17/2017 0931   EOSABS 0.1 06/18/2017 1030   EOSABS 0.1 05/17/2017 0931   BASOSABS 0.0 06/18/2017 1030   BASOSABS 0.0 05/17/2017 0931   -------------------------------  Imaging from last 24 hours (if applicable):  Radiology interpretation: No results found.

## 2017-06-26 ENCOUNTER — Other Ambulatory Visit: Payer: Self-pay | Admitting: Hematology

## 2017-06-26 DIAGNOSIS — R030 Elevated blood-pressure reading, without diagnosis of hypertension: Secondary | ICD-10-CM

## 2017-06-27 ENCOUNTER — Other Ambulatory Visit: Payer: Self-pay | Admitting: *Deleted

## 2017-06-27 DIAGNOSIS — R030 Elevated blood-pressure reading, without diagnosis of hypertension: Secondary | ICD-10-CM

## 2017-06-27 MED ORDER — AMLODIPINE BESYLATE 10 MG PO TABS: 10.0000 mg | ORAL_TABLET | Freq: Every day | ORAL | 2 refills | Status: DC

## 2017-06-28 ENCOUNTER — Inpatient Hospital Stay: Payer: BLUE CROSS/BLUE SHIELD

## 2017-06-28 ENCOUNTER — Encounter: Payer: Self-pay | Admitting: Nurse Practitioner

## 2017-06-28 ENCOUNTER — Inpatient Hospital Stay (HOSPITAL_BASED_OUTPATIENT_CLINIC_OR_DEPARTMENT_OTHER): Payer: BLUE CROSS/BLUE SHIELD | Admitting: Nurse Practitioner

## 2017-06-28 VITALS — BP 135/96 | HR 80 | Temp 97.8°F | Resp 18 | Ht 71.0 in | Wt 202.5 lb

## 2017-06-28 VITALS — BP 123/95 | HR 61 | Resp 18

## 2017-06-28 DIAGNOSIS — Z79899 Other long term (current) drug therapy: Secondary | ICD-10-CM

## 2017-06-28 DIAGNOSIS — C787 Secondary malignant neoplasm of liver and intrahepatic bile duct: Principal | ICD-10-CM

## 2017-06-28 DIAGNOSIS — D500 Iron deficiency anemia secondary to blood loss (chronic): Secondary | ICD-10-CM

## 2017-06-28 DIAGNOSIS — I10 Essential (primary) hypertension: Secondary | ICD-10-CM | POA: Diagnosis not present

## 2017-06-28 DIAGNOSIS — C189 Malignant neoplasm of colon, unspecified: Secondary | ICD-10-CM

## 2017-06-28 DIAGNOSIS — D696 Thrombocytopenia, unspecified: Secondary | ICD-10-CM

## 2017-06-28 DIAGNOSIS — G629 Polyneuropathy, unspecified: Secondary | ICD-10-CM

## 2017-06-28 DIAGNOSIS — E860 Dehydration: Secondary | ICD-10-CM

## 2017-06-28 DIAGNOSIS — T451X5A Adverse effect of antineoplastic and immunosuppressive drugs, initial encounter: Secondary | ICD-10-CM

## 2017-06-28 DIAGNOSIS — R634 Abnormal weight loss: Secondary | ICD-10-CM

## 2017-06-28 DIAGNOSIS — C182 Malignant neoplasm of ascending colon: Secondary | ICD-10-CM | POA: Diagnosis not present

## 2017-06-28 DIAGNOSIS — G620 Drug-induced polyneuropathy: Secondary | ICD-10-CM

## 2017-06-28 DIAGNOSIS — Z95828 Presence of other vascular implants and grafts: Secondary | ICD-10-CM

## 2017-06-28 DIAGNOSIS — R630 Anorexia: Secondary | ICD-10-CM

## 2017-06-28 LAB — CBC WITH DIFFERENTIAL/PLATELET
Basophils Absolute: 0 10*3/uL (ref 0.0–0.1)
Basophils Relative: 1 %
EOS ABS: 0.1 10*3/uL (ref 0.0–0.5)
EOS PCT: 3 %
HCT: 38.4 % (ref 38.4–49.9)
HEMOGLOBIN: 12.8 g/dL — AB (ref 13.0–17.1)
LYMPHS ABS: 0.9 10*3/uL (ref 0.9–3.3)
Lymphocytes Relative: 21 %
MCH: 32.6 pg (ref 27.2–33.4)
MCHC: 33.3 g/dL (ref 32.0–36.0)
MCV: 97.7 fL (ref 79.3–98.0)
MONOS PCT: 9 %
Monocytes Absolute: 0.4 10*3/uL (ref 0.1–0.9)
NEUTROS PCT: 66 %
Neutro Abs: 2.9 10*3/uL (ref 1.5–6.5)
PLATELETS: 133 10*3/uL — AB (ref 140–400)
RBC: 3.93 MIL/uL — ABNORMAL LOW (ref 4.20–5.82)
RDW: 16.5 % — ABNORMAL HIGH (ref 11.0–14.6)
WBC: 4.3 10*3/uL (ref 4.0–10.3)

## 2017-06-28 LAB — COMPREHENSIVE METABOLIC PANEL
ALK PHOS: 47 U/L (ref 40–150)
ALT: 13 U/L (ref 0–55)
AST: 16 U/L (ref 5–34)
Albumin: 3.9 g/dL (ref 3.5–5.0)
Anion gap: 9 (ref 3–11)
BUN: 22 mg/dL (ref 7–26)
CALCIUM: 9.3 mg/dL (ref 8.4–10.4)
CHLORIDE: 108 mmol/L (ref 98–109)
CO2: 24 mmol/L (ref 22–29)
CREATININE: 1.07 mg/dL (ref 0.70–1.30)
Glucose, Bld: 107 mg/dL (ref 70–140)
Potassium: 4.6 mmol/L (ref 3.5–5.1)
Sodium: 141 mmol/L (ref 136–145)
Total Bilirubin: 0.5 mg/dL (ref 0.2–1.2)
Total Protein: 6.8 g/dL (ref 6.4–8.3)

## 2017-06-28 MED ORDER — LEUCOVORIN CALCIUM INJECTION 350 MG
400.0000 mg/m2 | Freq: Once | INTRAVENOUS | Status: AC
  Administered 2017-06-28: 840 mg via INTRAVENOUS
  Filled 2017-06-28: qty 42

## 2017-06-28 MED ORDER — HEPARIN SOD (PORK) LOCK FLUSH 100 UNIT/ML IV SOLN
500.0000 [IU] | Freq: Once | INTRAVENOUS | Status: DC | PRN
  Filled 2017-06-28: qty 5

## 2017-06-28 MED ORDER — PALONOSETRON HCL INJECTION 0.25 MG/5ML
INTRAVENOUS | Status: AC
  Filled 2017-06-28: qty 5

## 2017-06-28 MED ORDER — PALONOSETRON HCL INJECTION 0.25 MG/5ML
0.2500 mg | Freq: Once | INTRAVENOUS | Status: AC
  Administered 2017-06-28: 0.25 mg via INTRAVENOUS

## 2017-06-28 MED ORDER — SODIUM CHLORIDE 0.9 % IV SOLN
Freq: Once | INTRAVENOUS | Status: AC
  Administered 2017-06-28: 11:00:00 via INTRAVENOUS

## 2017-06-28 MED ORDER — DEXTROSE 5 % IV SOLN: Freq: Once | INTRAVENOUS | Status: DC

## 2017-06-28 MED ORDER — DEXAMETHASONE SODIUM PHOSPHATE 10 MG/ML IJ SOLN
INTRAMUSCULAR | Status: AC
  Filled 2017-06-28: qty 1

## 2017-06-28 MED ORDER — DEXAMETHASONE SODIUM PHOSPHATE 10 MG/ML IJ SOLN
8.0000 mg | Freq: Once | INTRAMUSCULAR | Status: AC
  Administered 2017-06-28: 8 mg via INTRAVENOUS

## 2017-06-28 MED ORDER — SODIUM CHLORIDE 0.9% FLUSH
10.0000 mL | INTRAVENOUS | Status: DC | PRN
  Filled 2017-06-28: qty 10

## 2017-06-28 MED ORDER — SODIUM CHLORIDE 0.9 % IV SOLN
5.0000 mg/kg | Freq: Once | INTRAVENOUS | Status: AC
  Administered 2017-06-28: 450 mg via INTRAVENOUS
  Filled 2017-06-28: qty 16

## 2017-06-28 MED ORDER — SODIUM CHLORIDE 0.9 % IV SOLN
2200.0000 mg/m2 | INTRAVENOUS | Status: DC
  Administered 2017-06-28: 4600 mg via INTRAVENOUS
  Filled 2017-06-28: qty 92

## 2017-06-28 MED ORDER — SODIUM CHLORIDE 0.9% FLUSH
10.0000 mL | Freq: Once | INTRAVENOUS | Status: AC
  Administered 2017-06-28: 10 mL
  Filled 2017-06-28: qty 10

## 2017-06-28 NOTE — Progress Notes (Signed)
[place]  [phone] [phone]  Clinic Follow up Note   Patient Care Team: Curlene Labrum, MD as PCP - General (Family Medicine) 06/28/2017  CHIEF COMPLAINT: F/u metastatic right colon cancer   SUMMARY OF ONCOLOGIC HISTORY: Oncology History   Cancer Staging Metastatic colon cancer to liver [facility]) Staging form: Colon and Rectum, AJCC 8th Edition - Clinical stage from 06/01/2016: Stage IVA (cTX, cNX, pM1a) - Signed by Truitt Merle, MD on 07/04/2016 - Pathologic stage from 06/14/2016: Stage IVA (pT4b(m), pN2b, pM1a) - Signed by Truitt Merle, MD on 07/04/2016       Metastatic colon cancer to liver ([place])   04/2015 Procedure    Colonoscopy by Dr. Ladona Horns. It showed showed 2 sessile polyps ready between 3-5 mm in size located 20 cm (A, B) from the point of entry, polypectomy was performed. Pedunculated polyp was found in the ascending colon (C), polypectomy was performed, and additional polyp (D) was found 30 cm from the point of entry, removed      04/2015 Pathology Results    tubular adenoma (A and B), and well differentiated adenocarcinoma arising from tubulovillous adenoma (C) and well differentiated adenocarcinoma arising from severe dysplasia to intramucosal carcinoma within tubular adenoma.       04/2015 Initial Diagnosis    Metastatic colon cancer to liver ([place])      05/29/2016 Imaging    CT abdomen and pelvis with contrast showed an apple core like stricture in right colon just above the cecum, measuring 3.2 cm in lengths. This is highly suspicious for malignancy. Small lymph node a noticed he had adjacent mesentery, measuring 8 mm. There is a low-density lesion within the inferior aspect of the right hepatic lobe measuring 1.7 cm, suspicious for metastasis.       06/06/2016 Tumor Marker    CEA 9.99      06/08/2016 PET scan    IMPRESSION: Approximately 3 cm hypermetabolic mass in the ascending colon, consistent with primary colon carcinoma.  This mass results in colonic obstruction and small bowel dilatation. Additional areas of hypermetabolic wall thickening in the cecum may represent other sites of colon carcinoma or colitis. Mild hypermetabolic lymphadenopathy in right pericolonic region, porta hepatis, and aortocaval space, consistent with metastatic disease. Mild hypermetabolic mediastinal lymphadenopathy also seen, and thoracic lymph node metastases cannot be excluded. Solitary hypermetabolic focus in inferior right hepatic lobe, consistent with liver metastasis. Consider abdomen MRI without and with contrast for further evaluation.      06/14/2016 Surgery    Hand assisted right hemicolectomy and small bowel resection for colon cancer, liver biopsy, by Dr. Barry Dienes      06/14/2016 Pathology Results    Right hemicolectomy showed invasive well to moderately differentiated adenocarcinoma, 2 foci measuring 7.5 cm and 4.5 cm, tumor invades through full thickness of colon, to the seroma and involve the Small Bowel, Surgical Margins Were Negative, 24 Out Of 64 Lymph Nodes Were Positive, Extracapsular Extension Identified, Multiple Satellite Tumor Deposits Present, Liver Biopsy Showed Metastatic Adenocarcinoma.        06/14/2016 Miscellaneous    Tumor MMR normal, MSI stable       06/14/2016 Miscellaneous    Foundation one genomic testing showed K-ras G12 D mutation, APC and TP53 mutation. No BRAF and NRAS mutation. MSI-stable, tumor burden low.      07/06/2016 Tumor Marker    CEA 13.69      07/13/2016 - 01/18/2017 Chemotherapy    mFOLFOX, every 2 weeks, started on 07/14/2015,  Avastin added from cycle 3  Oxaliplatin dose to 65m/m2 due to side effects and some cytopenia on 09/21/16  Changed to FOLFIRINOX starting cycle 7 and Reduced  Due to neuropathy hold Oxaliplatin and add Irinotecan with neulasta on day 3 starting with cycle 7 Add low dose Oxaliplatin with cycle 8.  Due to his worsening neuropathy, and good response to  chemotherapy, I previously stopped oxaliplatin from cycle 11, and continue FOLFIRI and avastin         07/27/2016 Tumor Marker    CEA 15.85      09/04/2016 Imaging    Ct C/A/P W Contrast IMPRESSION: Interval right colectomy. Stable small liver metastasis in the inferior right hepatic lobe. Stable mild porta hepatis and aortocaval lymphadenopathy. Stable mild mediastinal lymphadenopathy. No new or progressive metastatic disease identified within the chest, abdomen, or pelvis.      12/19/2016 PET scan    IMPRESSION: 1. Right hemicolectomy, with resolution of the prior hypermetabolic activity inferiorly in the right hepatic lobe, in several mediastinal lymph nodes, and in lymph nodes in the retroperitoneum and porta hepatis. No residual hypermetabolic or enlarged lymph nodes are identified. 2. Low-grade diffuse skeletal metabolic activity is likely therapy related. 3. Coronary atherosclerosis. 4. 3 by 4 mm right middle lobe pulmonary nodule is stable, not appreciably hypermetabolic, but below sensitive PET-CT size thresholds. This may warrant surveillance.      01/11/2017 Imaging    MR Abdomen W WO Contrast IMPRESSION: 1. No acute findings within the abdomen. Previously noted liver metastasis has resolved in the interval. No new lesions.      02/01/2017 -  Chemotherapy    Maintenance therapy, Xeloda 20057m(1000 mg/m2)  q12h on day 1-14 every 21 days plus AVASTIN, starting 02/01/2017.  stopped after 12 days due to poor tolerance on 02/14/17  Changed to maintenance 5-FU and avastin every 2 weeks starting on 02/22/17        04/10/2017 Imaging    IMPRESSION: 1. Status post right hemicolectomy without findings for recurrent tumor. 2. No worrisome hepatic lesions. Treated disease with only a small residual low attenuation lesion in the right hepatic lobe. 3. No recurrent mediastinal or abdominal lymphadenopathy.      CURRENT THERAPY: Maintenance 5-Fu and Avastin started  01/04/2017, he tried one cycle Xeloda to replace 5-fu, but could not tolerate. Added leucovorin and decreased 5-FU 10% starting on 03/15/17.  INTERVAL HISTORY: Mr. CaCuthrelleturns for follow up as scheduled prior to next cycle 5FU/leucovorin and avastin. Last treated on 06/14/17. He vomited 3 times on the day of his pump d/c and continued to have nausea and vomiting. He ran out of ativan. [facility]17 for dehydration. He had mild tremor at that time. Symptoms improved. He continued to have mild nausea and vomiting intermittently throughout the week; controlled with zofran and ativan; he does not take compazine. This past week he has felt much better, his appetite returned and is eating and drinking well. Has gained weight intentionally. Denies fatigue, has been walking outside some days for exercise. His skin is constantly red on neck, chest, arms, legs, and hands but wears sun protection. Denies fever, chills, cough, dyspnea, constipation, diarrhea, or blood in stool. Has occasional small amount of blood when blowing nose. Neuropathy to hands and feet is stable with constant numbness to fingertips and toes. Denies recent tremor. No other concerns today.  REVIEW OF SYSTEMS:   Constitutional: Denies fevers, chills or abnormal weight loss (+) good appetite this week  Eyes: Denies blurriness of vision Ears, nose, mouth, throat, and face: Denies mucositis or sore throat Respiratory: Denies cough, dyspnea or wheezes Cardiovascular: Denies palpitation, chest discomfort or lower extremity swelling Gastrointestinal:  Denies constipation, diarrhea, abdominal pain, heartburn or change in bowel habits (+) intermittent nausea and vomiting x4 days after last chemo with last cycle, improved this week  Skin: Denies abnormal skin rashes (+) skin redness  Lymphatics: Denies new lymphadenopathy or easy bruising Neurological:Denies tingling or new weaknesses. Denies tremor (+) numbness to fingertips  improving, stable to toes Behavioral/Psych: Mood is stable, no new changes  All other systems were reviewed with the patient and are negative.  MEDICAL HISTORY:  Past Medical History:  Diagnosis Date  . Anxiety   . Cancer of ascending colon ([place])   . Seasonal allergies     SURGICAL HISTORY: Past Surgical History:  Procedure Laterality Date  . APPENDECTOMY  1992  . CATARACT EXTRACTION W/ INTRAOCULAR LENS IMPLANT Left 04/2016  . COLON SURGERY    . COLONOSCOPY W/ BIOPSIES AND POLYPECTOMY  04/2015  . INGUINAL HERNIA REPAIR Right 1982  . IR RADIOLOGIST EVAL & MGMT  01/16/2017  . KNEE ARTHROSCOPY W/ MENISCECTOMY Right 1998  . LAPAROSCOPIC RIGHT COLECTOMY N/A 06/14/2016   Procedure: LAPAROSCOPIC HAND ASSISTED HEMICOLECTOMY AND SMALL BOWEL RESECTION.;  Surgeon: [name], MD;  Location: [place];  Service: General;  Laterality: N/A;  . LIVER BIOPSY Right 06/14/2016   Procedure: LIVER BIOPSY;  Surgeon: [name], MD;  Location: [place];  Service: General;  Laterality: Right;  Right Inferior Liver  . PORTACATH PLACEMENT N/A 07/06/2016   Procedure: INSERTION PORT-A-CATH;  Surgeon: [name], MD;  Location: [place];  Service: General;  Laterality: N/A;  . VASECTOMY  2005    I have reviewed the social history and family history with the patient and they are unchanged from previous note.  ALLERGIES:  is allergic to penicillins and xeloda [capecitabine].  MEDICATIONS:  Current Outpatient Medications  Medication Sig Dispense Refill  . amLODipine (NORVASC) 10 MG tablet Take 1 tablet (10 mg total) by mouth daily. 30 tablet 2  . celecoxib (CELEBREX) 200 MG capsule Take 200 mg by mouth daily.  1  . diphenoxylate-atropine (LOMOTIL) 2.5-0.025 MG tablet Take 2 tablets by mouth 4 (four) times daily as needed for diarrhea or loose stools. 30 tablet 0  . FLUoxetine (PROZAC) 40 MG capsule Take 40 mg by mouth daily.    Marland Kitchen lidocaine-prilocaine (EMLA) cream Apply to affected area once 30  g 3  . lisinopril (PRINIVIL) 10 MG tablet Take 1 tablet (10 mg total) by mouth daily. 90 tablet 2  . LORazepam (ATIVAN) 0.5 MG tablet Take 1 tablet (0.5 mg total) by mouth every 8 (eight) hours as needed (for nausea). 30 tablet 0  . Multiple Vitamin (MULTIVITAMIN WITH MINERALS) TABS tablet Take 1 tablet by mouth daily.    . ondansetron (ZOFRAN ODT) 8 MG disintegrating tablet Take 1 tablet (8 mg total) by mouth every 8 (eight) hours as needed for nausea or vomiting. 30 tablet 2  . traMADol (ULTRAM) 50 MG tablet Take by mouth 2 (two) times daily.    . urea (CARMOL) 10 % cream Apply topically 3 (three) times daily. 71 g 1  . dexamethasone (DECADRON) 4 MG tablet For 5 days after chemo (begin day 2) 10 tablet 0  . prochlorperazine (COMPAZINE) 10 MG tablet Take 1 tablet (10 mg total) by mouth every 6 (six) hours as needed for nausea or vomiting.  30 tablet 3   No current facility-administered medications for this visit.    Facility-Administered Medications Ordered in Other Visits  Medication Dose Route Frequency Provider Last Rate Last Dose  . clindamycin (CLEOCIN) 900 mg in dextrose 5 % 50 mL IVPB  900 mg Intravenous 60 min Pre-Op [name], MD       And  . gentamicin (GARAMYCIN) 450 mg in dextrose 5 % 50 mL IVPB  5 mg/kg Intravenous 60 min Pre-Op [name], MD      . fluorouracil (ADRUCIL) 4,600 mg in sodium chloride 0.9 % 58 mL chemo infusion  2,200 mg/m2 (Treatment Plan Recorded) Intravenous 1 day or 1 dose Truitt Merle, MD   4,600 mg at 06/28/17 1253  . heparin lock flush 100 unit/mL  500 Units Intracatheter Once PRN Truitt Merle, MD      . heparin lock flush 100 unit/mL  500 Units Intracatheter Once PRN Truitt Merle, MD      . sodium chloride flush (NS) 0.9 % injection 10 mL  10 mL Intracatheter PRN Truitt Merle, MD      . sodium chloride flush (NS) 0.9 % injection 10 mL  10 mL Intracatheter PRN Truitt Merle, MD        PHYSICAL EXAMINATION: ECOG PERFORMANCE STATUS: 1 - Symptomatic but completely  ambulatory  Vitals:   06/28/17 0952  BP: (!) 135/96  Pulse: 80  Resp: 18  Temp: 97.8 F (36.6 C)  SpO2: 100%   Filed Weights   06/28/17 0952  Weight: 202 lb 8 oz (91.9 kg)    GENERAL:alert, no distress and comfortable SKIN: skin color, texture, turgor are normal, no rashes or significant lesions EYES: normal, Conjunctiva are pink and non-injected, sclera clear OROPHARYNX:no exudate, no erythema and lips, buccal mucosa, and tongue normal  NECK: supple, thyroid normal size, non-tender, without nodularity LYMPH:  no palpable cervical, supraclavicular, axillary, or inguinal lymphadenopathy  LUNGS: clear to auscultation and percussiobilaterally with normal breathing effort HEART: regular rate & rhythm and no murmurs and no lower extremity edema ABDOMEN:abdomen soft, non-tender and normal bowel sounds. No hepatomegaly  Musculoskeletal:no cyanosis of digits and no clubbing  NEURO: alert & oriented x 3 with fluent speech, no focal motor deficits (+) mildly decreased vibratory sense to feet bilaterally PAC without erythema   LABORATORY DATA:  I have reviewed the data as listed CBC Latest Ref Rng & Units 06/28/2017 06/18/2017 06/14/2017  WBC 4.0 - 10.3 K/uL 4.3 4.6 5.3  Hemoglobin 13.0 - 17.1 g/dL 12.8(L) - 13.0  Hematocrit 38.4 - 49.9 % 38.4 44.1 38.8  Platelets 140 - 400 K/uL 133(L) 146 125(L)     CMP Latest Ref Rng & Units 06/28/2017 06/18/2017 06/14/2017  Glucose 70 - 140 mg/dL 107 94 80  BUN 7 - 26 mg/dL _0 Creatinine 0.70 - 1.30 mg/dL 1.07 1.49(H) 1.13  Sodium 136 - 145 mmol/L 141 138 138  Potassium 3.5 - 5.1 mmol/L 4.6 4.5 4.6  Chloride 98 - 109 mmol/L 108 102 105  CO2 22 - 29 mmol/L _1 Calcium 8.4 - 10.4 mg/dL 9.3 9.7 9.0  Total Protein 6.4 - 8.3 g/dL 6.8 7.8 6.9  Total Bilirubin 0.2 - 1.2 mg/dL 0.5 1.5(H) 0.6  Alkaline Phos 40 - 150 U/L 47 57 50  AST 5 - 34 U/L _2 ALT 0 - 55 U/L _3 PATHOLOGY   Diagnosis 06/14/2016 1. Colon, segmental  resection for tumor, Right Ascending Hemicolectomy and  Small Bowel - INVASIVE WELL TO MODERATELY DIFFERENTIATED ADENOCARCINOMA. - TWO TUMOR FOCI MEASURING 7.5 CM AND 4.5 CM IN GREATEST DIMENSION. - TUMOR INVADES THROUGH FULL THICKNESS OF COLON, THROUGH THE SEROSA TO INVOLVE THE SMALL BOWEL. - MARGINS ARE NEGATIVE. - TWENTY FOUR OF SIXTY FOUR LYMPH NODES POSITIVE FOR METASTATIC ADENOCARCINOMA (24/64). - EXTRACAPSULAR EXTENSION IDENTIFIED - MULTIPLE SATELLITE TUMOR DEPOSITS PRESENT. - SEE ONCOLOGY TEMPLATE. 2. Liver, needle/core biopsy, Right Inferior - POSITIVE FOR METASTATIC ADENOCARCINOMA.      RADIOGRAPHIC STUDIES: I have personally reviewed the radiological images as listed and agreed with the findings in the report. No results found.   ASSESSMENT & PLAN: 57 y.o. Caucasian male, without significant past medical history, presented with rectal bleeding in December 2016, colonoscopy showed 4 polyps, 2 of them showed invasive adenocarcinoma, unfortunately he was not aware of the pathology findings and was not treated. He now presented with fatigue, weight loss, anorexia and abdominal pain  1. Right colon cancer with liver and node metastasis, [id number], stage IV, MSI-stable, KRAS mutation (+) -Mr. Herne appears stable today. He completed another cycle maintenance leucovorin/5FU and avastin on 06/14/17. He did not tolerate well, had increased emesis and weight loss for 1 week. Symptoms improved after IV hydration and restarting po ativan PRN for n/v. He has recovered well today, has gained weight and symptoms resolved. Labs reviewed, CBC with mild anemia and thrombocytopenia, CMP normal; labs adequate for treatment today. Will obtain restaging PET 07/09/17 before next cycle. F/u in 2 weeks with PET results and next cycle.  2. HTN -On amlodipine and lisinopril, took both today; rechecked in infusion 123/95. We reviewed BP can be elevated on avastin. Will continue to monitor.  -Urine  protein has been negative lately, not done today.   3. Anemia of iron deficiency and chemo -Initially diagnosed due to tumor bleeding; has been on oral iron intermittently; he currently take a multivitamin. Hgb stable 12.8  4. Anorexia and weight loss -His weight fluctuates; recently lost weight after last cycle due to n/v and decreased appetite; he gained weight back today. I encouraged him to eat well and maintain his weight, to drink nutrition supplements when he does not eat as much.  -I encouraged him to take anti-emetics prior to meals to help promote oral intake.  5. 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 Neuropathy is improving in his hands, stable in feet; no functional limitations. Will monitor   6. Goals of care discussion -He understands the goal of treatment is palliative -Full code   7. Thrombocytopenia -secondary to chemotherapy -Mild and stable; will monitor   PLAN -labs reviewed, proceed with next cycle leucovorin/5FU and avastin today -restaging PET before next cycle, 2/25 -Return in 2 weeks for PET results, f/u, and next cycle   All questions were answered. The patient knows to call the clinic with any problems, questions or concerns. No barriers to learning was detected.    Alla Feeling, NP 06/28/17

## 2017-06-28 NOTE — Patient Instructions (Signed)
[facility] Discharge Instructions for Patients Receiving Chemotherapy  Today you received the following chemotherapy agents Leucovorin.Avastin , and adrucil  To help prevent nausea and vomiting after your treatment, we encourage you to take your nausea medication as directed   If you develop nausea and vomiting that is not controlled by your nausea medication, call the clinic.   BELOW ARE SYMPTOMS THAT SHOULD BE REPORTED IMMEDIATELY:  *FEVER GREATER THAN 100.5 F  *CHILLS WITH OR WITHOUT FEVER  NAUSEA AND VOMITING THAT IS NOT CONTROLLED WITH YOUR NAUSEA MEDICATION  *UNUSUAL SHORTNESS OF BREATH  *UNUSUAL BRUISING OR BLEEDING  TENDERNESS IN MOUTH AND THROAT WITH OR WITHOUT PRESENCE OF ULCERS  *URINARY PROBLEMS  *BOWEL PROBLEMS  UNUSUAL RASH Items with * indicate a potential emergency and should be followed up as soon as possible.  Feel free to call the clinic should you have any questions or concerns. The clinic phone number is [phone].  Please show the [place] at check-in to the Emergency Department and triage nurse.

## 2017-06-29 ENCOUNTER — Telehealth: Payer: Self-pay | Admitting: Nurse Practitioner

## 2017-06-29 NOTE — Telephone Encounter (Signed)
Scheduled appt per 2/14 los - patient to get an updated schedule next visit.   

## 2017-06-30 ENCOUNTER — Inpatient Hospital Stay: Payer: BLUE CROSS/BLUE SHIELD

## 2017-06-30 VITALS — BP 131/89 | HR 77 | Temp 97.7°F | Resp 16

## 2017-06-30 DIAGNOSIS — C189 Malignant neoplasm of colon, unspecified: Secondary | ICD-10-CM

## 2017-06-30 DIAGNOSIS — C787 Secondary malignant neoplasm of liver and intrahepatic bile duct: Principal | ICD-10-CM

## 2017-06-30 DIAGNOSIS — C182 Malignant neoplasm of ascending colon: Secondary | ICD-10-CM | POA: Diagnosis not present

## 2017-06-30 MED ORDER — SODIUM CHLORIDE 0.9% FLUSH
10.0000 mL | INTRAVENOUS | Status: DC | PRN
  Administered 2017-06-30: 10 mL
  Filled 2017-06-30: qty 10

## 2017-06-30 MED ORDER — HEPARIN SOD (PORK) LOCK FLUSH 100 UNIT/ML IV SOLN
500.0000 [IU] | Freq: Once | INTRAVENOUS | Status: AC | PRN
  Administered 2017-06-30: 500 [IU]
  Filled 2017-06-30: qty 5

## 2017-06-30 NOTE — Patient Instructions (Signed)

## 2017-07-09 ENCOUNTER — Other Ambulatory Visit (HOSPITAL_COMMUNITY): Payer: BLUE CROSS/BLUE SHIELD

## 2017-07-09 ENCOUNTER — Ambulatory Visit (HOSPITAL_COMMUNITY)
Admission: RE | Admit: 2017-07-09 | Discharge: 2017-07-09 | Disposition: A | Discharge status: Home / Self Care | Payer: BLUE CROSS/BLUE SHIELD | Source: Ambulatory Visit | Attending: Nurse Practitioner | Admitting: Nurse Practitioner

## 2017-07-09 DIAGNOSIS — I2510 Atherosclerotic heart disease of native coronary artery without angina pectoris: Secondary | ICD-10-CM | POA: Insufficient documentation

## 2017-07-09 DIAGNOSIS — R911 Solitary pulmonary nodule: Secondary | ICD-10-CM | POA: Insufficient documentation

## 2017-07-09 DIAGNOSIS — Z9889 Other specified postprocedural states: Secondary | ICD-10-CM | POA: Insufficient documentation

## 2017-07-09 DIAGNOSIS — I77819 Aortic ectasia, unspecified site: Secondary | ICD-10-CM | POA: Insufficient documentation

## 2017-07-09 DIAGNOSIS — C189 Malignant neoplasm of colon, unspecified: Secondary | ICD-10-CM | POA: Insufficient documentation

## 2017-07-09 DIAGNOSIS — C787 Secondary malignant neoplasm of liver and intrahepatic bile duct: Secondary | ICD-10-CM | POA: Insufficient documentation

## 2017-07-09 LAB — GLUCOSE, CAPILLARY: GLUCOSE-CAPILLARY: 98 mg/dL (ref 65–99)

## 2017-07-09 MED ORDER — FLUDEOXYGLUCOSE F - 18 (FDG) INJECTION
9.6000 | Freq: Once | INTRAVENOUS | Status: AC | PRN
  Administered 2017-07-09: 9.6 via INTRAVENOUS

## 2017-07-11 NOTE — Progress Notes (Signed)
[place]  [phone] [phone]  Clinic Follow Up Note  Patient Care Team: Curlene Labrum, MD as PCP - General (Family Medicine)   Date of Service:  07/12/2017   CHIEF COMPLAINTS:  Follow up metastatic right colon cancer   Oncology History   Cancer Staging Metastatic colon cancer to liver [facility]) Staging form: Colon and Rectum, AJCC 8th Edition - Clinical stage from 06/01/2016: Stage IVA (cTX, cNX, pM1a) - Signed by Truitt Merle, MD on 07/04/2016 - Pathologic stage from 06/14/2016: Stage IVA (pT4b(m), pN2b, pM1a) - Signed by Truitt Merle, MD on 07/04/2016       Metastatic colon cancer to liver ([place])   04/2015 Procedure    Colonoscopy by Dr. Ladona Horns. It showed showed 2 sessile polyps ready between 3-5 mm in size located 20 cm (A, B) from the point of entry, polypectomy was performed. Pedunculated polyp was found in the ascending colon (C), polypectomy was performed, and additional polyp (D) was found 30 cm from the point of entry, removed      04/2015 Pathology Results    tubular adenoma (A and B), and well differentiated adenocarcinoma arising from tubulovillous adenoma (C) and well differentiated adenocarcinoma arising from severe dysplasia to intramucosal carcinoma within tubular adenoma.       04/2015 Initial Diagnosis    Metastatic colon cancer to liver ([place])      05/29/2016 Imaging    CT abdomen and pelvis with contrast showed an apple core like stricture in right colon just above the cecum, measuring 3.2 cm in lengths. This is highly suspicious for malignancy. Small lymph node a noticed he had adjacent mesentery, measuring 8 mm. There is a low-density lesion within the inferior aspect of the right hepatic lobe measuring 1.7 cm, suspicious for metastasis.       06/06/2016 Tumor Marker    CEA 9.99      06/08/2016 PET scan    IMPRESSION: Approximately 3 cm hypermetabolic mass in the ascending colon, consistent with primary colon carcinoma.  This mass results in colonic obstruction and small bowel dilatation. Additional areas of hypermetabolic wall thickening in the cecum may represent other sites of colon carcinoma or colitis. Mild hypermetabolic lymphadenopathy in right pericolonic region, porta hepatis, and aortocaval space, consistent with metastatic disease. Mild hypermetabolic mediastinal lymphadenopathy also seen, and thoracic lymph node metastases cannot be excluded. Solitary hypermetabolic focus in inferior right hepatic lobe, consistent with liver metastasis. Consider abdomen MRI without and with contrast for further evaluation.      06/14/2016 Surgery    Hand assisted right hemicolectomy and small bowel resection for colon cancer, liver biopsy, by Dr. Barry Dienes      06/14/2016 Pathology Results    Right hemicolectomy showed invasive well to moderately differentiated adenocarcinoma, 2 foci measuring 7.5 cm and 4.5 cm, tumor invades through full thickness of colon, to the seroma and involve the Small Bowel, Surgical Margins Were Negative, 24 Out Of 64 Lymph Nodes Were Positive, Extracapsular Extension Identified, Multiple Satellite Tumor Deposits Present, Liver Biopsy Showed Metastatic Adenocarcinoma.        06/14/2016 Miscellaneous    Tumor MMR normal, MSI stable       06/14/2016 Miscellaneous    Foundation one genomic testing showed K-ras G12 D mutation, APC and TP53 mutation. No BRAF and NRAS mutation. MSI-stable, tumor burden low.      07/06/2016 Tumor Marker    CEA 13.69      07/13/2016 - 01/18/2017 Chemotherapy    mFOLFOX, every 2  weeks, started on 07/14/2015, Avastin added from cycle 3  Oxaliplatin dose to 70m/m2 due to side effects and some cytopenia on 09/21/16  Changed to FOLFIRINOX starting cycle 7 and Reduced  Due to neuropathy hold Oxaliplatin and add Irinotecan with neulasta on day 3 starting with cycle 7 Add low dose Oxaliplatin with cycle 8.  Due to his worsening neuropathy, and good response to  chemotherapy, I previously stopped oxaliplatin from cycle 11, and continue FOLFIRI and avastin         07/27/2016 Tumor Marker    CEA 15.85      09/04/2016 Imaging    Ct C/A/P W Contrast IMPRESSION: Interval right colectomy. Stable small liver metastasis in the inferior right hepatic lobe. Stable mild porta hepatis and aortocaval lymphadenopathy. Stable mild mediastinal lymphadenopathy. No new or progressive metastatic disease identified within the chest, abdomen, or pelvis.      12/19/2016 PET scan    IMPRESSION: 1. Right hemicolectomy, with resolution of the prior hypermetabolic activity inferiorly in the right hepatic lobe, in several mediastinal lymph nodes, and in lymph nodes in the retroperitoneum and porta hepatis. No residual hypermetabolic or enlarged lymph nodes are identified. 2. Low-grade diffuse skeletal metabolic activity is likely therapy related. 3. Coronary atherosclerosis. 4. 3 by 4 mm right middle lobe pulmonary nodule is stable, not appreciably hypermetabolic, but below sensitive PET-CT size thresholds. This may warrant surveillance.      01/11/2017 Imaging    MR Abdomen W WO Contrast IMPRESSION: 1. No acute findings within the abdomen. Previously noted liver metastasis has resolved in the interval. No new lesions.      02/01/2017 -  Chemotherapy    Maintenance therapy, Xeloda 20051m(1000 mg/m2)  q12h on day 1-14 every 21 days plus AVASTIN, starting 02/01/2017.  stopped after 12 days due to poor tolerance on 02/14/17  Changed to maintenance 5-FU and avastin every 2 weeks starting on 02/22/17        04/10/2017 Imaging    IMPRESSION: 1. Status post right hemicolectomy without findings for recurrent tumor. 2. No worrisome hepatic lesions. Treated disease with only a small residual low attenuation lesion in the right hepatic lobe. 3. No recurrent mediastinal or abdominal lymphadenopathy.       07/09/2017 PET scan    PET 07/09/17 IMPRESSION: 1. Status  post right hemicolectomy, without findings of hypermetabolic recurrent or metastatic disease. 2. A right middle lobe pulmonary nodule is unchanged. However, there is a right lower lobe 5 mm pulmonary nodule which is felt to be new and enlarged compared to prior exams. Suspicious for an isolated pulmonary metastasis. Consider CT follow-up at 3-6 months. 3. Age advanced coronary artery atherosclerosis. Recommend assessment of coronary risk factors and consideration of medical therapy. 4. Borderline ascending aortic dilatation, 4.0 cm         HISTORY OF PRESENTING ILLNESS (06/01/2016):  Nicholas Bode89.o. male is here because of his recently abdominal CT which is highly suspicious for metastatic colon cancer. He is accompanied by his wife to my clinic today. He was referred by his primary care physician Dr. BuPleas Koch  He had colonoscopy in 2016 for mild rectal bleeding, he describe small amount fresh blood mixed with stool, he has no other constitutional symptoms at that time. He was referred to gastroenterologist Dr. CaLadona Hornsnd underwent a colonoscopy in December 2016. The colonoscopy showed 2 sessile polyps ready between 3-5 mm in size located 20 cm (A, B) from the point of entry, polypectomy was performed. Pedunculated polyp  was found in the ascending colon (C), polypectomy was performed, and additional polyp (D) was found 30 cm from the point of entry, removed. The pathology reviewed tubular adenoma (A and B), and well differentiated adenocarcinoma arising from tubulovillous adenoma (C) and well differentiated adenocarcinoma arising from severe dysplasia to intramucosal carcinoma within tubular adenoma. Dr. Tye Maryland tried multiple times to reach patient, but patient sought they were calling him about the bill, and did not return the phone calls. He was not aware the cancer diagnosis until recently.   He started having diarrhea and vomiting in mid Dec 2017, and felt a "pop" in right side  abdomen, he was seen at urgent care, and was treated with antiemetics, and IVF, lab tests were OK. Due to his persistent intermittent diarrhea and epigastric pain since then, he was seen by PCP and he eventually had CT abdomen and pelvis scan which showed a upper core lesion in the ascending colon, and I'll 1.7 cm lesion in the liver, highly suspicious for metastasis. He was referred to [place] for further evaluation.  He has lost 30 lbs in the past one month, has low appetite, eats a small meals 1-2 times a day. He has moderate fatigue, able to tolerate routine activities including his work, but feels exhausted at the end of study. He has occasional constipation, denies recent rectal bleeding.  CURRENT THERAPY: Maintenance 5-Fu and Avastin started 01/04/2017, he tried one cycle Xeloda to replace 5-fu, but could not tolerate. Added leucovorin and decreased 5-Fu 10% starting on 03/15/17.  INTERIM HISTORY:  Nicholas Lowery returns today for follow up and treatment. Since his last visit he presented to the symptoms management clinic on 06/18/17 for dehydration caused by significant emesis. He was given IV Fluids and improved well.   He presents to the clinic today accompanied by his wife. He notes he has been getting very irritable and short tempered lately. He is not sure if this is just his mood of his situation or effects of chemo treatment. He admits his mood has been depressed but he has not been suicidal. He has been snapping at work and was emotional in clinic today and notes he has been crying more lately. He has been taking Prozac for several years. He is willing to speak with someone about his depression.   He notes his last chemo cycle went well. He notes he has not been drinking enough fluids as of recent. His wife notes she has to encouraged him to eat and sleep. He notes his concenr for his kidney function given his recent dehydration. Today's LFTs were normal.   On review of symptoms, pt notes anxiety and  anger lately. Admits to depression and suicidal thoughts. He has low appetite and not drinking much.     MEDICAL HISTORY:  Past Medical History:  Diagnosis Date  . Anxiety   . Cancer of ascending colon ([place])   . Seasonal allergies     SURGICAL HISTORY: Past Surgical History:  Procedure Laterality Date  . APPENDECTOMY  1992  . CATARACT EXTRACTION W/ INTRAOCULAR LENS IMPLANT Left 04/2016  . COLON SURGERY    . COLONOSCOPY W/ BIOPSIES AND POLYPECTOMY  04/2015  . INGUINAL HERNIA REPAIR Right 1982  . IR RADIOLOGIST EVAL & MGMT  01/16/2017  . KNEE ARTHROSCOPY W/ MENISCECTOMY Right 1998  . LAPAROSCOPIC RIGHT COLECTOMY N/A 06/14/2016   Procedure: LAPAROSCOPIC HAND ASSISTED HEMICOLECTOMY AND SMALL BOWEL RESECTION.;  Surgeon: [name], MD;  Location: [place];  Service: General;  Laterality: N/A;  .  LIVER BIOPSY Right 06/14/2016   Procedure: LIVER BIOPSY;  Surgeon: [name], MD;  Location: [place];  Service: General;  Laterality: Right;  Right Inferior Liver  . PORTACATH PLACEMENT N/A 07/06/2016   Procedure: INSERTION PORT-A-CATH;  Surgeon: [name], MD;  Location: [place];  Service: General;  Laterality: N/A;  . VASECTOMY  2005    SOCIAL HISTORY: Social History   Socioeconomic History  . Marital status: Married    Spouse name: Not on file  . Number of children: Not on file  . Years of education: Not on file  . Highest education level: Not on file  Social Needs  . Financial resource strain: Not on file  . Food insecurity - worry: Not on file  . Food insecurity - inability: Not on file  . Transportation needs - medical: Not on file  . Transportation needs - non-medical: Not on file  Occupational History  . Not on file  Tobacco Use  . Smoking status: Former Smoker    Years: 2.00    Types: Cigarettes    Last attempt to quit: 1990    Years since quitting: 29.1  . Smokeless tobacco: Never Used  Substance and Sexual Activity  . Alcohol use: Yes     Alcohol/week: 2.4 oz    Types: 4 Cans of beer per week    Comment: social, none since colon surgery  . Drug use: No    Comment: 06/15/2016 "nothing since college"  . Sexual activity: Yes  Other Topics Concern  . Not on file  Social History Narrative  . Not on file   He is married. They have 3 boys, 59-12 yo. He works for a [employer], desk job.   FAMILY HISTORY: Family History  Problem Relation Age of Onset  . Cancer Mother        lung cancer  . Stroke Mother   . Hypertension Father   . CAD Father   . Cancer Maternal Grandfather        prostate cancer     ALLERGIES:  is allergic to penicillins and xeloda [capecitabine].  MEDICATIONS:  Current Outpatient Medications  Medication Sig Dispense Refill  . amLODipine (NORVASC) 10 MG tablet Take 1 tablet (10 mg total) by mouth daily. 30 tablet 2  . celecoxib (CELEBREX) 200 MG capsule Take 200 mg by mouth daily.  1  . dexamethasone (DECADRON) 4 MG tablet For 5 days after chemo (begin day 2) 10 tablet 0  . diphenoxylate-atropine (LOMOTIL) 2.5-0.025 MG tablet Take 2 tablets by mouth 4 (four) times daily as needed for diarrhea or loose stools. 30 tablet 0  . FLUoxetine (PROZAC) 40 MG capsule Take 40 mg by mouth daily.    Marland Kitchen lidocaine-prilocaine (EMLA) cream Apply to affected area once 30 g 3  . lisinopril (PRINIVIL) 10 MG tablet Take 1 tablet (10 mg total) by mouth daily. 90 tablet 2  . LORazepam (ATIVAN) 0.5 MG tablet Take 1 tablet (0.5 mg total) by mouth every 8 (eight) hours as needed (for nausea). 30 tablet 0  . Multiple Vitamin (MULTIVITAMIN WITH MINERALS) TABS tablet Take 1 tablet by mouth daily.    . ondansetron (ZOFRAN ODT) 8 MG disintegrating tablet Take 1 tablet (8 mg total) by mouth every 8 (eight) hours as needed for nausea or vomiting. 30 tablet 2  . prochlorperazine (COMPAZINE) 10 MG tablet Take 1 tablet (10 mg total) by mouth every 6 (six) hours as needed for nausea or vomiting. 30 tablet 3  .  traMADol (ULTRAM) 50  MG tablet Take by mouth 2 (two) times daily.    . urea (CARMOL) 10 % cream Apply topically 3 (three) times daily. 71 g 1   No current facility-administered medications for this visit.    Facility-Administered Medications Ordered in Other Visits  Medication Dose Route Frequency Provider Last Rate Last Dose  . bevacizumab (AVASTIN) 450 mg in sodium chloride 0.9 % 100 mL chemo infusion  5 mg/kg (Treatment Plan Recorded) Intravenous Once Truitt Merle, MD      . clindamycin (CLEOCIN) 900 mg in dextrose 5 % 50 mL IVPB  900 mg Intravenous 60 min Pre-Op [name], MD       And  . gentamicin (GARAMYCIN) 450 mg in dextrose 5 % 50 mL IVPB  5 mg/kg Intravenous 60 min Pre-Op [name], MD      . fluorouracil (ADRUCIL) 4,600 mg in sodium chloride 0.9 % 58 mL chemo infusion  2,200 mg/m2 (Treatment Plan Recorded) Intravenous 1 day or 1 dose Truitt Merle, MD      . heparin lock flush 100 unit/mL  500 Units Intracatheter Once PRN Truitt Merle, MD      . leucovorin 840 mg in dextrose 5 % 250 mL infusion  400 mg/m2 (Treatment Plan Recorded) Intravenous Once Truitt Merle, MD 584 mL/hr at 07/12/17 1146 840 mg at 07/12/17 1146  . sodium chloride flush (NS) 0.9 % injection 10 mL  10 mL Intracatheter PRN Truitt Merle, MD         REVIEW OF SYSTEMS:  Constitutional: Denies fevers, chills or abnormal night sweats (+) low appetite and water intake (+) trouble sleeping Eyes: Denies blurriness of vision, double vision or watery eyes Ears, nose, mouth, throat, and face: negative Respiratory:negative  Cardiovascular: Denies palpitation, chest discomfort or lower extremity swelling Gastrointestinal:  Denies heartburn or change in bowel habits   Lymphatics: Denies new lymphadenopathy or easy bruising Neurological: negative MSK: Muscle weakness in hands Behavioral/Psych: Mood is stable, no new changes (+) Depression, anxiety, moody swings.  All other systems were reviewed with the patient and are negative.  PHYSICAL  EXAMINATION:  ECOG PERFORMANCE STATUS: 1 - Symptomatic but completely ambulatory  Vitals:   07/12/17 0958  BP: (!) 137/97  Pulse: 68  Resp: 20  Temp: 97.8 F (36.6 C)  TempSrc: Oral  SpO2: 98%  Weight: 196 lb 8 oz (89.1 kg)  Height: '5\' 11"'$  (1.803 m)    GENERAL:alert, no distress and comfortable. SKIN: skin color, texture, turgor are normal, no rashes or significant lesions EYES: normal, conjunctiva are pink and non-injected, sclera clear OROPHARYNX:no exudate, no erythema and lips, buccal mucosa, and tongue normal   NECK: supple, thyroid normal size, non-tender, without nodularity LYMPH:  no palpable lymphadenopathy in the cervical, axillary or inguinal LUNGS: clear to auscultation and percussion with normal breathing effort HEART: regular rate & rhythm and no murmurs and no lower extremity edema ABDOMEN:abdomen soft, Surgical scar in the midline around the umbilical has healed well. non-tender and normal bowel sounds Musculoskeletal:no cyanosis of digits and no clubbing  PSYCH: alert & oriented x 3 with fluent speech NEURO: no focal motor/sensory deficits   LABORATORY DATA:  I have reviewed the data as listed CBC Latest Ref Rng & Units 07/12/2017 06/28/2017 06/18/2017  WBC 4.0 - 10.3 K/uL 3.9(L) 4.3 4.6  Hemoglobin 13.0 - 17.1 g/dL 13.1 12.8(L) -  Hematocrit 38.4 - 49.9 % 39.3 38.4 44.1  Platelets 140 - 400 K/uL 133(L) 133(L) 146   CMP Latest Ref  Rng & Units 07/12/2017 06/28/2017 06/18/2017  Glucose 70 - 140 mg/dL 74 107 94  BUN 7 - 26 mg/dL '16 22 24  '$ Creatinine 0.70 - 1.30 mg/dL 1.08 1.07 1.49(H)  Sodium 136 - 145 mmol/L 139 141 138  Potassium 3.5 - 5.1 mmol/L 4.6 4.6 4.5  Chloride 98 - 109 mmol/L 104 108 102  CO2 22 - 29 mmol/L '25 24 26  '$ Calcium 8.4 - 10.4 mg/dL 9.8 9.3 9.7  Total Protein 6.4 - 8.3 g/dL 7.1 6.8 7.8  Total Bilirubin 0.2 - 1.2 mg/dL 0.7 0.5 1.5(H)  Alkaline Phos 40 - 150 U/L 46 47 57  AST 5 - 34 U/L '19 16 24  '$ ALT 0 - 55 U/L '17 13 22   '$ PATHOLOGY    Diagnosis 06/14/2016 1. Colon, segmental resection for tumor, Right Ascending Hemicolectomy and Small Bowel - INVASIVE WELL TO MODERATELY DIFFERENTIATED ADENOCARCINOMA. - TWO TUMOR FOCI MEASURING 7.5 CM AND 4.5 CM IN GREATEST DIMENSION. - TUMOR INVADES THROUGH FULL THICKNESS OF COLON, THROUGH THE SEROSA TO INVOLVE THE SMALL BOWEL. - MARGINS ARE NEGATIVE. - TWENTY FOUR OF SIXTY FOUR LYMPH NODES POSITIVE FOR METASTATIC ADENOCARCINOMA (24/64). - EXTRACAPSULAR EXTENSION IDENTIFIED - MULTIPLE SATELLITE TUMOR DEPOSITS PRESENT. - SEE ONCOLOGY TEMPLATE. 2. Liver, needle/core biopsy, Right Inferior - POSITIVE FOR METASTATIC ADENOCARCINOMA.    RADIOGRAPHIC STUDIES: I have personally reviewed his outside CT scan from 05/29/2016 and agreed with the findings in the report. Nm Pet Image Restag (ps) Skull Base To Thigh  Result Date: 07/09/2017 CLINICAL DATA:  Subsequent treatment strategy for metastatic colon cancer to the liver. Ongoing chemotherapy. Restaging. EXAM: NUCLEAR MEDICINE PET SKULL BASE TO THIGH TECHNIQUE: 9.6 mCi F-18 FDG was injected intravenously. Full-ring PET imaging was performed from the skull base to thigh after the radiotracer. CT data was obtained and used for attenuation correction and anatomic localization. FASTING BLOOD GLUCOSE:  Value: 98 mg/dl COMPARISON:  12/19/2016 PET.  Diagnostic CTs 04/10/2017. FINDINGS: NECK: No areas of abnormal hypermetabolism.  No cervical adenopathy. CHEST: No pulmonary parenchymal or thoracic nodal hypermetabolism identified. Left Port-A-Cath terminates at the mid SVC. Mild cardiomegaly with LAD coronary artery atherosclerosis. Ascending aorta upper normal at 4.0 cm. 4 mm subpleural right middle lobe pulmonary nodule is similar, including on image 49/8. A right lower lobe 5 mm pulmonary nodule on image 45/8 is not readily apparent on the prior PET and favored to measure 4 mm on the prior diagnostic CT. Also felt to be new since 09/04/2016 diagnostic CT.  Below PET resolution. ABDOMEN/PELVIS: No abdominopelvic nodal or parenchymal hypermetabolism. Surgical changes of right hemicolectomy. SKELETON: Right gluteal muscular activity is mild and may be posttraumatic. No suspicious marrow hypermetabolism or focal osseous lesion. IMPRESSION: 1. Status post right hemicolectomy, without findings of hypermetabolic recurrent or metastatic disease. 2. A right middle lobe pulmonary nodule is unchanged. However, there is a right lower lobe 5 mm pulmonary nodule which is felt to be new and enlarged compared to prior exams. Suspicious for an isolated pulmonary metastasis. Consider CT follow-up at 3-6 months. 3. Age advanced coronary artery atherosclerosis. Recommend assessment of coronary risk factors and consideration of medical therapy. 4. Borderline ascending aortic dilatation, 4.0 cm. Electronically Signed   By: Abigail Miyamoto M.D.   On: 07/09/2017 09:27   MR Abdomen W WO Contrast 01/11/2017 IMPRESSION: 1. No acute findings within the abdomen. Previously noted liver metastasis has resolved in the interval. No new lesions.  CT CAP 09/04/16 IMPRESSION: Interval right colectomy. Stable small liver metastasis in the  inferior right hepatic lobe. Stable mild porta hepatis and aortocaval lymphadenopathy. Stable mild mediastinal lymphadenopathy. No new or progressive metastatic disease identified within the chest, abdomen, or pelvis.  ASSESSMENT & PLAN:  57 y.o. Caucasian male, without significant past medical history, presented with rectal bleeding in December 2016, colonoscopy showed 4 polyps, 2 of them showed invasive adenocarcinoma, unfortunately he was not aware of the pathology findings and was not treated. He now presented with fatigue, weight loss, anorexia and abdominal pain  1. Right colon cancer with liver and node metastasis, pT4bN2bM1a, stage IV, MSI-stable, KRAS mutation (+) -I previously reviewed her PET scan findings, which showed solitary liver  metastasis, abdominal and possible thoracic node metastasis  -His liver biopsy confirmed metastasis -Due to the bowel obstruction, he underwent upfront hemicolectomy. I previously reviewed his surgical pathology findings, which showed 2 primary right colon cancer, very locally advanced with 24 lymph nodes positive, surgical margins were negative. -I previously discussed his [facility] one genomic testing results, which showed care arrest mutation, and as high stable, low tumor burden. So he would not benefit from EGFR inhibitor, or immunotherapy alone. -The was started on palliative first-line chemotherapy FOLFOX on 07/13/16. Avastin was added from cycle 3 -He has solitary liver metastasis, but multiple mild hypermetabolic adenopathy in mediastinum and abdomen. We previously discussed the possible local therapy options, such as liver metastectomy or ablation if he has great response to chemo and his node metastasis resolves  -restaging CT from 09/04/2016 reviewed with pt, stable disease. Will continue chemo -he developed side effects from FOLFOX, and a worsening thrombocytopenia, despite reduced dose of oxaliplatin  -Due to his overall limited response to FOLFOX, and his wish to pursue surgery, I previously recommend him to change chemo to FOLFIRINOX starting with cycle 7 with low dose oxaliplatin, hopefully he will response better. -Due to his severe nausea and vomiting, anorexia, mild diarrhea, reduced his irinotecan dose from cycle 10  - PET scan from 12/19/2016 reviewed in person. He had near complete metabolic response from chemotherapy, post his liver metastasis and hypermetabolic lymph nodes are negative on PET scan with decreased size.  -Due to his worsening neuropathy, and good response to chemotherapy, I previously stopped oxaliplatin from cycle 11, and continue FOLFIRI and avastin   -Reviewed his restaging abdominal MRI findings, which showed no visible lesions in the liver. He has had a complete  radiographic response -He was seen by interventional radiologist Dr. Earleen Newport, who does not think he needed a liver ablation since the solid liver met has resolved after chemo. I agree with his plan.  -He was recently seen by surgeon Dr. Barry Dienes, who recommend maintenance chemotherapy for now, no indication for resection at this point given -Given his excellent response to chemotherapy, and the poor tolerance to intravenous chemotherapy, I recommend him to change treatments tomaintenance chemotherapy therapy -He started Xeloda and Avastin maintenance therapy 02/01/17, due to very poor tolerance he stopped Xeloda 02/14/17 -he has been on  5-Fu pump and avastin starting 02/22/17.  -Patient would like to come off chemo if possible. I suggest another 3-4 months maintenance therapy and repeat a scan before discussing stopping chemo. He agreed.   -The patient favors to take chemo break if the next PET scan is completely negative. He understands his cancer is probably not cured, and possible disease progression after stopping chemo. -We discussed his PET from 07/09/17 which shows a stable and a new small nodule in the lungs, indeterminate cause at this time. Otherwise no findings of  hypermetabolic recurrent or metastatic disease. Will repeat CT Chest in 2 months to monitor his lung nodules. I encouraged him to continue with his current maintenance chemo regimen. Per Pt request I will give him a chemo break after today's cycle of chemo.  -Labs reviewed, plt at 133K, lymphocyte count at 0.8, Hg WNL, WBC at 3.9 and urine protein negative and LFTs WNL. Overall adequate to proceed with treatment today.  -F/u in 4 weeks   2. Depression, Anxiety  -Pt has long-standing history of depression, has been on Prozac for decades, it was very well controlled in the past.   -He has become more depressed lately, due to the stress from cancer diagnosis, especially stage IV, with prolonged chemotherapy, stress from work and family  responsibility, etc.  He has developed multiple symptoms, including depressed mood, insomnia, personality change, etc.  He also has suicidal ideas. -He has good family and social support.   -We had a long discussion about depression management.  -I discussed the role of antidepressants, he has been on Prozac 40 mg for several years. I suggest changing to Effexor. He will wean off Prozac in 2 weeks. Start Effexor '75mg'$  on week two of taper, will titrate dose if needed  -I also suggest he start attending GI Cancer support group, I had GI Navigator Dawn speak with him more about this and other resources. Dawn recommend him to see psychologist Earl Lites, his wife will call for an urgent appointment  -I suggest he try benadryl or melatonin at night to help him sleep.  -I encouraged him to slow down at work and take time for himself.   3. Hypertension -We previously discussed Avastin can cause hypertension -I previously started him on amlodipine, and increase dose to 10 mg daily -He does not want to follow-up with his primary care physician. -Previously, his blood pressure was noted to be elevated, especially diastolic. I previously added on lisinopril 10 mg daily for him -Continue Norvasc and Lisinopril for now while on Avastin -Will continue to monitor -BP better controlled with Lisinopril and amlodipine -His blood pressure was high in the infusion room, will give him 0.1 mg clonidine  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 -I previously recommend him to take oral iron supplement over-the-counter, potential side effects of constipation and gastric or discomfort or discussed with him. He stopped taking this before surgery. -I previously advised him to begin taking oral iron supplements again post surgery -Hg normal at 13.1 and iron study has been normal lately. Will continue to monitor.    5. Anorexia and weight loss  -Secondary  to underlying malignancy -I previously encouraged him to try nutritional supplement, such as boost or initial and try to eat more -weight continues to fluctuate, will monitor.  -He was encouraged to continue eating well and take supplemental drinks as needed to better maintain weight.  -I discussed how his depression can effects his appetite and to make sure he is eating and drinking well.   6. Peripheral neuropathy, G1  -Secondary to oxaliplatin, dose reduced and ultimately discontinued -Slightly worsened when he was on FOLFIRI, held Irinotecan beginning 01/04/17 -Improved some, his hand function normalized, no balance issue, discussed with patient this may take some time to resolve.  - I strongly encouraged him to take vitamin B-complex supplements -Not mentioned in today's visit, Will continue to monitor.  7. Goal of care discussion  -We previously discussed the  incurable nature of his cancer, and the overall poor prognosis, especially if he does not have good response to chemotherapy or progress on chemo -The patient understands the goal of care is palliative.  -He is full code now     8. Thrombocytopenia  -secondary to chemo, overall mild  -will continue to closely monitor.  -PLT 133K today (07/12/17)   Plan  -Prescribed Effexor '75mg'$  today which he will start in one week, he will wean off Prozac in 2 weeks  -Set up counseling with psychologist Earl Lites in a few days  -Cancel appointments on 3/14 and 3/16 -Lab, flush, f/u and chemo 5-fu/LV and avastin in 4 and 6 weeks   No orders of the defined types were placed in this encounter.   All questions were answered.   The patient knows to call the clinic with any problems, questions or concerns.  I spent 35 minutes counseling the patient face to face. The total time spent in the appointment was 50 minutes and more than 50% was on counseling.  This document serves as a record of services personally performed by Truitt Merle, MD.  It was created on her behalf by Joslyn Devon, a trained medical scribe. The creation of this record is based on the scribe's personal observations and the provider's statements to them.    I have reviewed the above documentation for accuracy and completeness, and I agree with the above.   Truitt Merle  07/12/2017

## 2017-07-12 ENCOUNTER — Inpatient Hospital Stay: Payer: BLUE CROSS/BLUE SHIELD

## 2017-07-12 ENCOUNTER — Inpatient Hospital Stay (HOSPITAL_BASED_OUTPATIENT_CLINIC_OR_DEPARTMENT_OTHER): Payer: BLUE CROSS/BLUE SHIELD | Admitting: Hematology

## 2017-07-12 ENCOUNTER — Encounter: Payer: Self-pay | Admitting: Hematology

## 2017-07-12 VITALS — BP 137/97 | HR 68 | Temp 97.8°F | Resp 20 | Ht 71.0 in | Wt 196.5 lb

## 2017-07-12 VITALS — BP 132/84

## 2017-07-12 DIAGNOSIS — D509 Iron deficiency anemia, unspecified: Secondary | ICD-10-CM

## 2017-07-12 DIAGNOSIS — C771 Secondary and unspecified malignant neoplasm of intrathoracic lymph nodes: Secondary | ICD-10-CM | POA: Diagnosis not present

## 2017-07-12 DIAGNOSIS — D500 Iron deficiency anemia secondary to blood loss (chronic): Secondary | ICD-10-CM | POA: Diagnosis not present

## 2017-07-12 DIAGNOSIS — G629 Polyneuropathy, unspecified: Secondary | ICD-10-CM | POA: Diagnosis not present

## 2017-07-12 DIAGNOSIS — R590 Localized enlarged lymph nodes: Secondary | ICD-10-CM | POA: Diagnosis not present

## 2017-07-12 DIAGNOSIS — C182 Malignant neoplasm of ascending colon: Secondary | ICD-10-CM | POA: Diagnosis not present

## 2017-07-12 DIAGNOSIS — R911 Solitary pulmonary nodule: Secondary | ICD-10-CM

## 2017-07-12 DIAGNOSIS — C189 Malignant neoplasm of colon, unspecified: Secondary | ICD-10-CM

## 2017-07-12 DIAGNOSIS — D696 Thrombocytopenia, unspecified: Secondary | ICD-10-CM | POA: Diagnosis not present

## 2017-07-12 DIAGNOSIS — R630 Anorexia: Secondary | ICD-10-CM

## 2017-07-12 DIAGNOSIS — C787 Secondary malignant neoplasm of liver and intrahepatic bile duct: Principal | ICD-10-CM

## 2017-07-12 DIAGNOSIS — I10 Essential (primary) hypertension: Secondary | ICD-10-CM

## 2017-07-12 DIAGNOSIS — E860 Dehydration: Secondary | ICD-10-CM | POA: Diagnosis not present

## 2017-07-12 DIAGNOSIS — Z95828 Presence of other vascular implants and grafts: Secondary | ICD-10-CM

## 2017-07-12 DIAGNOSIS — R634 Abnormal weight loss: Secondary | ICD-10-CM

## 2017-07-12 DIAGNOSIS — Z79899 Other long term (current) drug therapy: Secondary | ICD-10-CM

## 2017-07-12 LAB — CBC WITH DIFFERENTIAL/PLATELET
Basophils Absolute: 0 10*3/uL (ref 0.0–0.1)
Basophils Relative: 1 %
EOS ABS: 0.1 10*3/uL (ref 0.0–0.5)
EOS PCT: 3 %
HCT: 39.3 % (ref 38.4–49.9)
HEMOGLOBIN: 13.1 g/dL (ref 13.0–17.1)
LYMPHS ABS: 0.7 10*3/uL — AB (ref 0.9–3.3)
LYMPHS PCT: 18 %
MCH: 32.8 pg (ref 27.2–33.4)
MCHC: 33.3 g/dL (ref 32.0–36.0)
MCV: 98.3 fL — ABNORMAL HIGH (ref 79.3–98.0)
MONOS PCT: 14 %
Monocytes Absolute: 0.5 10*3/uL (ref 0.1–0.9)
NEUTROS PCT: 64 %
Neutro Abs: 2.5 10*3/uL (ref 1.5–6.5)
Platelets: 133 10*3/uL — ABNORMAL LOW (ref 140–400)
RBC: 4 MIL/uL — AB (ref 4.20–5.82)
RDW: 16.5 % — ABNORMAL HIGH (ref 11.0–14.6)
WBC: 3.9 10*3/uL — AB (ref 4.0–10.3)

## 2017-07-12 LAB — IRON AND TIBC
Iron: 111 ug/dL (ref 42–163)
Saturation Ratios: 33 % — ABNORMAL LOW (ref 42–163)
TIBC: 336 ug/dL (ref 202–409)
UIBC: 226 ug/dL

## 2017-07-12 LAB — CEA (IN HOUSE-CHCC): CEA (CHCC-IN HOUSE): 3.26 ng/mL (ref 0.00–5.00)

## 2017-07-12 LAB — COMPREHENSIVE METABOLIC PANEL
ALT: 17 U/L (ref 0–55)
AST: 19 U/L (ref 5–34)
Albumin: 4.2 g/dL (ref 3.5–5.0)
Alkaline Phosphatase: 46 U/L (ref 40–150)
Anion gap: 10 (ref 3–11)
BUN: 16 mg/dL (ref 7–26)
CHLORIDE: 104 mmol/L (ref 98–109)
CO2: 25 mmol/L (ref 22–29)
CREATININE: 1.08 mg/dL (ref 0.70–1.30)
Calcium: 9.8 mg/dL (ref 8.4–10.4)
GFR calc Af Amer: >60 mL/min (ref >60)
GFR calc non Af Amer: >60 mL/min (ref >60)
Glucose, Bld: 74 mg/dL (ref 70–140)
Potassium: 4.6 mmol/L (ref 3.5–5.1)
SODIUM: 139 mmol/L (ref 136–145)
Total Bilirubin: 0.7 mg/dL (ref 0.2–1.2)
Total Protein: 7.1 g/dL (ref 6.4–8.3)

## 2017-07-12 LAB — TOTAL PROTEIN, URINE DIPSTICK: Protein, ur: NEGATIVE mg/dL

## 2017-07-12 LAB — FERRITIN: FERRITIN: 118 ng/mL (ref 22–316)

## 2017-07-12 MED ORDER — CLONIDINE HCL 0.1 MG PO TABS
ORAL_TABLET | ORAL | Status: AC
  Filled 2017-07-12: qty 1

## 2017-07-12 MED ORDER — FLUOROURACIL CHEMO INJECTION 5 GM/100ML
2200.0000 mg/m2 | INTRAVENOUS | Status: DC
  Administered 2017-07-12: 4600 mg via INTRAVENOUS
  Filled 2017-07-12: qty 92

## 2017-07-12 MED ORDER — BEVACIZUMAB CHEMO INJECTION 400 MG/16ML
5.0000 mg/kg | Freq: Once | INTRAVENOUS | Status: AC
  Administered 2017-07-12: 450 mg via INTRAVENOUS
  Filled 2017-07-12: qty 16

## 2017-07-12 MED ORDER — SODIUM CHLORIDE 0.9% FLUSH
10.0000 mL | Freq: Once | INTRAVENOUS | Status: AC
  Administered 2017-07-12: 10 mL
  Filled 2017-07-12: qty 10

## 2017-07-12 MED ORDER — VENLAFAXINE HCL ER 75 MG PO CP24: 75.0000 mg | ORAL_CAPSULE | Freq: Every day | ORAL | 1 refills | Status: DC

## 2017-07-12 MED ORDER — DEXAMETHASONE SODIUM PHOSPHATE 10 MG/ML IJ SOLN
INTRAMUSCULAR | Status: AC
  Filled 2017-07-12: qty 1

## 2017-07-12 MED ORDER — ACETAMINOPHEN 500 MG PO TABS
500.0000 mg | ORAL_TABLET | Freq: Once | ORAL | Status: AC
  Administered 2017-07-12: 500 mg via ORAL

## 2017-07-12 MED ORDER — CLONIDINE HCL 0.1 MG PO TABS
0.1000 mg | ORAL_TABLET | Freq: Once | ORAL | Status: AC
  Administered 2017-07-12: 0.1 mg via ORAL

## 2017-07-12 MED ORDER — LEUCOVORIN CALCIUM INJECTION 350 MG
400.0000 mg/m2 | Freq: Once | INTRAMUSCULAR | Status: AC
  Administered 2017-07-12: 840 mg via INTRAVENOUS
  Filled 2017-07-12: qty 42

## 2017-07-12 MED ORDER — DEXAMETHASONE SODIUM PHOSPHATE 10 MG/ML IJ SOLN
8.0000 mg | Freq: Once | INTRAMUSCULAR | Status: AC
  Administered 2017-07-12: 8 mg via INTRAVENOUS

## 2017-07-12 MED ORDER — PALONOSETRON HCL INJECTION 0.25 MG/5ML
INTRAVENOUS | Status: AC
  Filled 2017-07-12: qty 5

## 2017-07-12 MED ORDER — PALONOSETRON HCL INJECTION 0.25 MG/5ML
0.2500 mg | Freq: Once | INTRAVENOUS | Status: AC
  Administered 2017-07-12: 0.25 mg via INTRAVENOUS

## 2017-07-12 MED ORDER — SODIUM CHLORIDE 0.9 % IV SOLN
Freq: Once | INTRAVENOUS | Status: AC
  Administered 2017-07-12: 11:00:00 via INTRAVENOUS

## 2017-07-12 MED ORDER — ACETAMINOPHEN 500 MG PO TABS
ORAL_TABLET | ORAL | Status: AC
  Filled 2017-07-12: qty 1

## 2017-07-12 NOTE — Patient Instructions (Signed)
[facility] Discharge Instructions for Patients Receiving Chemotherapy  Today you received the following chemotherapy agents:  Leucovorin, Avastin, and 5FU.  To help prevent nausea and vomiting after your treatment, we encourage you to take your nausea medication as directed.   If you develop nausea and vomiting that is not controlled by your nausea medication, call the clinic.   BELOW ARE SYMPTOMS THAT SHOULD BE REPORTED IMMEDIATELY:  *FEVER GREATER THAN 100.5 F  *CHILLS WITH OR WITHOUT FEVER  NAUSEA AND VOMITING THAT IS NOT CONTROLLED WITH YOUR NAUSEA MEDICATION  *UNUSUAL SHORTNESS OF BREATH  *UNUSUAL BRUISING OR BLEEDING  TENDERNESS IN MOUTH AND THROAT WITH OR WITHOUT PRESENCE OF ULCERS  *URINARY PROBLEMS  *BOWEL PROBLEMS  UNUSUAL RASH Items with * indicate a potential emergency and should be followed up as soon as possible.  Feel free to call the clinic should you have any questions or concerns. The clinic phone number is [phone].  Please show the CHEMO ALERT CARD at check-in to the Emergency Department and triage nurse.   

## 2017-07-12 NOTE — Progress Notes (Signed)
  Oncology Nurse Navigator Documentation  Navigator Location: [facility] (07/12/17 1114)   )Navigator Encounter Type: Follow-up Appt (07/12/17 1114)    [name] requested that I meet with patient and wife during follow-up visit.  Patient tearful stating "I have compartmentalized my cancer for the last year and it hit me that this will be long term." Patient reports being anxious, demonstrated catastrophic thinking and endorsed using ineffective coping strategies (drinking) to cope with the negative emotions. Patient did report having "dark thoughts" but was able to verbalize a safety plan. 1. If he feels hopeless and has thoughts of harming himself he will tell his wife immediately 2. He will call 911 if he cannot keep himself safe. Patient is familiar with Vivia Budge [facility] - (psychologist ) and verbalized that he would reach out to Mr. Nicole Kindred today to request an urgent appointment. I provided wife, Tye [place], with Marjory Lies Stewart's direct phone number to facilitate the appointment. Our social work department is available to speak with the patient and I also encouraged patient to attend our GI Support group. Patient also verbalized that he would call with questions or concerns moving forward.                      Barriers/Navigation Needs: Family concerns (07/12/17 1114)   Interventions: Referrals;Psycho-social support;Other (07/12/17 1114) Referrals: Other(Psychologist - Vivia Budge) (07/12/17 1114)      Support Groups/Services: GI Support Group (07/12/17 1114)   Acuity: Level 2 (07/12/17 1114)   Acuity Level 2: Ongoing guidance and education throughout treatment as needed (07/12/17 1114)     Time Spent with Patient: 30 (07/12/17 1114)

## 2017-07-13 ENCOUNTER — Telehealth: Payer: Self-pay | Admitting: Hematology

## 2017-07-13 NOTE — Telephone Encounter (Signed)
Letter/Calendar mailed to patient with updated appointments per 2/28 sch msg °

## 2017-07-14 ENCOUNTER — Inpatient Hospital Stay: Payer: BLUE CROSS/BLUE SHIELD | Attending: Hematology

## 2017-07-14 VITALS — BP 132/91 | HR 79 | Temp 98.4°F | Resp 18

## 2017-07-14 DIAGNOSIS — C771 Secondary and unspecified malignant neoplasm of intrathoracic lymph nodes: Secondary | ICD-10-CM | POA: Insufficient documentation

## 2017-07-14 DIAGNOSIS — F419 Anxiety disorder, unspecified: Secondary | ICD-10-CM | POA: Insufficient documentation

## 2017-07-14 DIAGNOSIS — D509 Iron deficiency anemia, unspecified: Secondary | ICD-10-CM | POA: Diagnosis not present

## 2017-07-14 DIAGNOSIS — G629 Polyneuropathy, unspecified: Secondary | ICD-10-CM | POA: Insufficient documentation

## 2017-07-14 DIAGNOSIS — E860 Dehydration: Secondary | ICD-10-CM | POA: Diagnosis not present

## 2017-07-14 DIAGNOSIS — Z9049 Acquired absence of other specified parts of digestive tract: Secondary | ICD-10-CM | POA: Diagnosis not present

## 2017-07-14 DIAGNOSIS — R45851 Suicidal ideations: Secondary | ICD-10-CM | POA: Diagnosis not present

## 2017-07-14 DIAGNOSIS — H269 Unspecified cataract: Secondary | ICD-10-CM | POA: Diagnosis not present

## 2017-07-14 DIAGNOSIS — Z79899 Other long term (current) drug therapy: Secondary | ICD-10-CM | POA: Insufficient documentation

## 2017-07-14 DIAGNOSIS — R911 Solitary pulmonary nodule: Secondary | ICD-10-CM | POA: Diagnosis not present

## 2017-07-14 DIAGNOSIS — R634 Abnormal weight loss: Secondary | ICD-10-CM | POA: Insufficient documentation

## 2017-07-14 DIAGNOSIS — D500 Iron deficiency anemia secondary to blood loss (chronic): Secondary | ICD-10-CM | POA: Insufficient documentation

## 2017-07-14 DIAGNOSIS — G4700 Insomnia, unspecified: Secondary | ICD-10-CM | POA: Diagnosis not present

## 2017-07-14 DIAGNOSIS — C182 Malignant neoplasm of ascending colon: Secondary | ICD-10-CM | POA: Diagnosis present

## 2017-07-14 DIAGNOSIS — C787 Secondary malignant neoplasm of liver and intrahepatic bile duct: Secondary | ICD-10-CM | POA: Diagnosis not present

## 2017-07-14 DIAGNOSIS — R590 Localized enlarged lymph nodes: Secondary | ICD-10-CM | POA: Diagnosis not present

## 2017-07-14 DIAGNOSIS — R630 Anorexia: Secondary | ICD-10-CM | POA: Diagnosis not present

## 2017-07-14 DIAGNOSIS — C189 Malignant neoplasm of colon, unspecified: Secondary | ICD-10-CM

## 2017-07-14 DIAGNOSIS — Z5111 Encounter for antineoplastic chemotherapy: Secondary | ICD-10-CM | POA: Insufficient documentation

## 2017-07-14 DIAGNOSIS — Z87891 Personal history of nicotine dependence: Secondary | ICD-10-CM | POA: Diagnosis not present

## 2017-07-14 DIAGNOSIS — D696 Thrombocytopenia, unspecified: Secondary | ICD-10-CM | POA: Diagnosis not present

## 2017-07-14 DIAGNOSIS — I10 Essential (primary) hypertension: Secondary | ICD-10-CM | POA: Insufficient documentation

## 2017-07-14 MED ORDER — HEPARIN SOD (PORK) LOCK FLUSH 100 UNIT/ML IV SOLN
500.0000 [IU] | Freq: Once | INTRAVENOUS | Status: AC | PRN
  Administered 2017-07-14: 500 [IU]
  Filled 2017-07-14: qty 5

## 2017-07-14 MED ORDER — SODIUM CHLORIDE 0.9% FLUSH
10.0000 mL | INTRAVENOUS | Status: DC | PRN
  Administered 2017-07-14: 10 mL
  Filled 2017-07-14: qty 10

## 2017-07-24 ENCOUNTER — Other Ambulatory Visit: Payer: Self-pay | Admitting: Medical

## 2017-07-24 ENCOUNTER — Inpatient Hospital Stay: Payer: BLUE CROSS/BLUE SHIELD

## 2017-07-24 ENCOUNTER — Encounter: Payer: Self-pay | Admitting: Hematology

## 2017-07-24 ENCOUNTER — Other Ambulatory Visit: Payer: Self-pay | Admitting: *Deleted

## 2017-07-24 ENCOUNTER — Inpatient Hospital Stay (HOSPITAL_BASED_OUTPATIENT_CLINIC_OR_DEPARTMENT_OTHER): Payer: BLUE CROSS/BLUE SHIELD | Admitting: Medical

## 2017-07-24 ENCOUNTER — Telehealth: Payer: Self-pay | Admitting: *Deleted

## 2017-07-24 VITALS — BP 100/61 | HR 112 | Temp 97.6°F | Resp 18 | Ht 71.0 in

## 2017-07-24 DIAGNOSIS — R509 Fever, unspecified: Secondary | ICD-10-CM

## 2017-07-24 DIAGNOSIS — C182 Malignant neoplasm of ascending colon: Secondary | ICD-10-CM | POA: Diagnosis not present

## 2017-07-24 DIAGNOSIS — E860 Dehydration: Secondary | ICD-10-CM

## 2017-07-24 DIAGNOSIS — C787 Secondary malignant neoplasm of liver and intrahepatic bile duct: Secondary | ICD-10-CM

## 2017-07-24 DIAGNOSIS — R197 Diarrhea, unspecified: Secondary | ICD-10-CM

## 2017-07-24 DIAGNOSIS — M791 Myalgia, unspecified site: Secondary | ICD-10-CM

## 2017-07-24 DIAGNOSIS — R112 Nausea with vomiting, unspecified: Secondary | ICD-10-CM

## 2017-07-24 DIAGNOSIS — R05 Cough: Secondary | ICD-10-CM

## 2017-07-24 DIAGNOSIS — C189 Malignant neoplasm of colon, unspecified: Secondary | ICD-10-CM

## 2017-07-24 DIAGNOSIS — Z95828 Presence of other vascular implants and grafts: Secondary | ICD-10-CM

## 2017-07-24 DIAGNOSIS — R059 Cough, unspecified: Secondary | ICD-10-CM

## 2017-07-24 LAB — CMP (CANCER CENTER ONLY)
ALBUMIN: 4.1 g/dL (ref 3.5–5.0)
ALT: 18 U/L (ref 0–55)
ANION GAP: 13 — AB (ref 3–11)
AST: 18 U/L (ref 5–34)
Alkaline Phosphatase: 42 U/L (ref 40–150)
BUN: 25 mg/dL (ref 7–26)
CHLORIDE: 110 mmol/L — AB (ref 98–109)
CO2: 15 mmol/L — AB (ref 22–29)
Calcium: 9.8 mg/dL (ref 8.4–10.4)
Creatinine: 1.79 mg/dL — ABNORMAL HIGH (ref 0.70–1.30)
GFR, Est AFR Am: 48 mL/min — ABNORMAL LOW (ref >60)
GFR, Estimated: 41 mL/min — ABNORMAL LOW (ref >60)
Glucose, Bld: 133 mg/dL (ref 70–140)
POTASSIUM: 4.5 mmol/L (ref 3.5–5.1)
SODIUM: 138 mmol/L (ref 136–145)
Total Bilirubin: 1.1 mg/dL (ref 0.2–1.2)
Total Protein: 7.4 g/dL (ref 6.4–8.3)

## 2017-07-24 LAB — CBC WITH DIFFERENTIAL (CANCER CENTER ONLY)
Basophils Absolute: 0 10*3/uL (ref 0.0–0.1)
Basophils Relative: 0 %
EOS PCT: 0 %
Eosinophils Absolute: 0 10*3/uL (ref 0.0–0.5)
HCT: 45.4 % (ref 38.4–49.9)
HEMOGLOBIN: 15.3 g/dL (ref 13.0–17.1)
LYMPHS ABS: 0.1 10*3/uL — AB (ref 0.9–3.3)
Lymphocytes Relative: 1 %
MCH: 32.7 pg (ref 27.2–33.4)
MCHC: 33.7 g/dL (ref 32.0–36.0)
MCV: 97.2 fL (ref 79.3–98.0)
Monocytes Absolute: 0.6 10*3/uL (ref 0.1–0.9)
Monocytes Relative: 8 %
Neutro Abs: 6.8 10*3/uL — ABNORMAL HIGH (ref 1.5–6.5)
Neutrophils Relative %: 91 %
PLATELETS: 176 10*3/uL (ref 140–400)
RBC: 4.67 MIL/uL (ref 4.20–5.82)
RDW: 16.7 % — AB (ref 11.0–14.6)
WBC: 7.5 10*3/uL (ref 4.0–10.3)

## 2017-07-24 LAB — INFLUENZA PANEL BY PCR (TYPE A & B)
INFLAPCR: NEGATIVE
INFLBPCR: NEGATIVE

## 2017-07-24 LAB — MAGNESIUM: Magnesium: 1.8 mg/dL (ref 1.5–2.5)

## 2017-07-24 MED ORDER — ACETAMINOPHEN 325 MG PO TABS
650.0000 mg | ORAL_TABLET | Freq: Once | ORAL | Status: AC
  Administered 2017-07-24: 650 mg via ORAL

## 2017-07-24 MED ORDER — ONDANSETRON HCL 4 MG/2ML IJ SOLN
8.0000 mg | Freq: Once | INTRAMUSCULAR | Status: AC
  Administered 2017-07-24: 8 mg via INTRAVENOUS

## 2017-07-24 MED ORDER — SODIUM CHLORIDE 0.9 % IV SOLN
Freq: Once | INTRAVENOUS | Status: AC
  Administered 2017-07-24: 15:00:00 via INTRAVENOUS

## 2017-07-24 MED ORDER — DEXAMETHASONE SODIUM PHOSPHATE 10 MG/ML IJ SOLN
INTRAMUSCULAR | Status: AC
  Filled 2017-07-24: qty 1

## 2017-07-24 MED ORDER — SODIUM CHLORIDE 0.9 % IV SOLN: 10.0000 mg | Freq: Once | INTRAVENOUS | Status: DC

## 2017-07-24 MED ORDER — DEXAMETHASONE SODIUM PHOSPHATE 10 MG/ML IJ SOLN: 10.0000 mg | Freq: Once | INTRAMUSCULAR | Status: DC

## 2017-07-24 MED ORDER — ACETAMINOPHEN 325 MG PO TABS
ORAL_TABLET | ORAL | Status: AC
Start: 2017-07-24 — End: 2017-07-24
  Filled 2017-07-24: qty 2

## 2017-07-24 MED ORDER — DIPHENOXYLATE-ATROPINE 2.5-0.025 MG PO TABS: 2.0000 | ORAL_TABLET | Freq: Once | ORAL | Status: DC

## 2017-07-24 MED ORDER — DIPHENOXYLATE-ATROPINE 2.5-0.025 MG PO TABS
2.0000 | ORAL_TABLET | Freq: Once | ORAL | Status: AC
  Administered 2017-07-24: 2 via ORAL

## 2017-07-24 MED ORDER — HEPARIN SOD (PORK) LOCK FLUSH 100 UNIT/ML IV SOLN
500.0000 [IU] | Freq: Once | INTRAVENOUS | Status: AC
Start: 2017-07-24 — End: 2017-07-24
  Administered 2017-07-24: 500 [IU]
  Filled 2017-07-24: qty 5

## 2017-07-24 MED ORDER — ONDANSETRON HCL 4 MG/2ML IJ SOLN
INTRAMUSCULAR | Status: AC
  Filled 2017-07-24: qty 4

## 2017-07-24 MED ORDER — DIPHENOXYLATE-ATROPINE 2.5-0.025 MG PO TABS
ORAL_TABLET | ORAL | Status: AC
  Filled 2017-07-24: qty 2

## 2017-07-24 MED ORDER — SODIUM CHLORIDE 0.9 % IV SOLN
10.0000 mg | Freq: Once | INTRAVENOUS | Status: DC
  Filled 2017-07-24: qty 1

## 2017-07-24 MED ORDER — DEXAMETHASONE SODIUM PHOSPHATE 10 MG/ML IJ SOLN
10.0000 mg | Freq: Once | INTRAMUSCULAR | Status: AC
  Administered 2017-07-24: 10 mg via INTRAVENOUS

## 2017-07-24 MED ORDER — SODIUM CHLORIDE 0.9% FLUSH
10.0000 mL | Freq: Once | INTRAVENOUS | Status: AC
  Administered 2017-07-24: 10 mL
  Filled 2017-07-24: qty 10

## 2017-07-24 MED ORDER — SODIUM CHLORIDE 0.9 % IV SOLN: Freq: Once | INTRAVENOUS | Status: DC

## 2017-07-24 NOTE — Telephone Encounter (Signed)
Received call from wife requesting a call back from nurse.   Spoke with pt at home, and was informed that pt has had diarrhea and vomiting since 330 am.  Stated she did not take anything for the symptoms as pt could not keep anything down.   Instructed pt to come in NOW for lab and to see [place], [place]  In [facility].  Pt voiced understanding and en route.

## 2017-07-24 NOTE — Progress Notes (Signed)
Nasak swab done for Flu A&B, after zofran and decadron. tolerated well.  No vomiting or diarrhea. Resting quietly. Able to take 2 lomotil orally w/o problems. Taking sips of water.  Sipping on gingerale. No nausea/vomiting/diarrhea. VSS  C/o headache. Given tylenol 650 mg prior to discharge.  Discharge home with wife. Educated on [employer], fluids and rest. He and wife voiced understanding.

## 2017-07-24 NOTE — Patient Instructions (Signed)
Dehydration, Adult Dehydration is a condition in which there is not enough fluid or water in the body. This happens when you lose more fluids than you take in. Important organs, such as the kidneys, brain, and heart, cannot function without a proper amount of fluids. Any loss of fluids from the body can lead to dehydration. Dehydration can range from mild to severe. This condition should be treated right away to prevent it from becoming severe. What are the causes? This condition may be caused by:  Vomiting.  Diarrhea.  Excessive sweating, such as from heat exposure or exercise.  Not drinking enough fluid, especially: ? When ill. ? While doing activity that requires a lot of energy.  Excessive urination.  Fever.  Infection.  Certain medicines, such as medicines that cause the body to lose excess fluid (diuretics).  Inability to access safe drinking water.  Reduced physical ability to get adequate water and food.  What increases the risk? This condition is more likely to develop in people:  Who have a poorly controlled long-term (chronic) illness, such as diabetes, heart disease, or kidney disease.  Who are age 65 or older.  Who are disabled.  Who live in a place with high altitude.  Who play endurance sports.  What are the signs or symptoms? Symptoms of mild dehydration may include:  Thirst.  Dry lips.  Slightly dry mouth.  Dry, warm skin.  Dizziness. Symptoms of moderate dehydration may include:  Very dry mouth.  Muscle cramps.  Dark urine. Urine may be the color of tea.  Decreased urine production.  Decreased tear production.  Heartbeat that is irregular or faster than normal (palpitations).  Headache.  Light-headedness, especially when you stand up from a sitting position.  Fainting (syncope). Symptoms of severe dehydration may include:  Changes in skin, such as: ? Cold and clammy skin. ? Blotchy (mottled) or pale skin. ? Skin that does  not quickly return to normal after being lightly pinched and released (poor skin turgor).  Changes in body fluids, such as: ? Extreme thirst. ? No tear production. ? Inability to sweat when body temperature is high, such as in hot weather. ? Very little urine production.  Changes in vital signs, such as: ? Weak pulse. ? Pulse that is more than 100 beats a minute when sitting still. ? Rapid breathing. ? Low blood pressure.  Other changes, such as: ? Sunken eyes. ? Cold hands and feet. ? Confusion. ? Lack of energy (lethargy). ? Difficulty waking up from sleep. ? Short-term weight loss. ? Unconsciousness. How is this diagnosed? This condition is diagnosed based on your symptoms and a physical exam. Blood and urine tests may be done to help confirm the diagnosis. How is this treated? Treatment for this condition depends on the severity. Mild or moderate dehydration can often be treated at home. Treatment should be started right away. Do not wait until dehydration becomes severe. Severe dehydration is an emergency and it needs to be treated in a hospital. Treatment for mild dehydration may include:  Drinking more fluids.  Replacing salts and minerals in your blood (electrolytes) that you may have lost. Treatment for moderate dehydration may include:  Drinking an oral rehydration solution (ORS). This is a drink that helps you replace fluids and electrolytes (rehydrate). It can be found at pharmacies and retail stores. Treatment for severe dehydration may include:  Receiving fluids through an IV tube.  Receiving an electrolyte solution through a feeding tube that is passed through your nose   and into your stomach (nasogastric tube, or NG tube).  Correcting any abnormalities in electrolytes.  Treating the underlying cause of dehydration. Follow these instructions at home:  If directed by your health care provider, drink an ORS: ? Make an ORS by following instructions on the  package. ? Start by drinking small amounts, about  cup (120 mL) every 5-10 minutes. ? Slowly increase how much you drink until you have taken the amount recommended by your health care provider.  Drink enough clear fluid to keep your urine clear or pale yellow. If you were told to drink an ORS, finish the ORS first, then start slowly drinking other clear fluids. Drink fluids such as: ? Water. Do not drink only water. Doing that can lead to having too little salt (sodium) in the body (hyponatremia). ? Ice chips. ? Fruit juice that you have added water to (diluted fruit juice). ? Low-calorie sports drinks.  Avoid: ? Alcohol. ? Drinks that contain a lot of sugar. These include high-calorie sports drinks, fruit juice that is not diluted, and soda. ? Caffeine. ? Foods that are greasy or contain a lot of fat or sugar.  Take over-the-counter and prescription medicines only as told by your health care provider.  Do not take sodium tablets. This can lead to having too much sodium in the body (hypernatremia).  Eat foods that contain a healthy balance of electrolytes, such as bananas, oranges, potatoes, tomatoes, and spinach.  Keep all follow-up visits as told by your health care provider. This is important. Contact a health care provider if:  You have abdominal pain that: ? Gets worse. ? Stays in one area (localizes).  You have a rash.  You have a stiff neck.  You are more irritable than usual.  You are sleepier or more difficult to wake up than usual.  You feel weak or dizzy.  You feel very thirsty.  You have urinated only a small amount of very dark urine over 6-8 hours. Get help right away if:  You have symptoms of severe dehydration.  You cannot drink fluids without vomiting.  Your symptoms get worse with treatment.  You have a fever.  You have a severe headache.  You have vomiting or diarrhea that: ? Gets worse. ? Does not go away.  You have blood or green matter  (bile) in your vomit.  You have blood in your stool. This may cause stool to look black and tarry.  You have not urinated in 6-8 hours.  You faint.  Your heart rate while sitting still is over 100 beats a minute.  You have trouble breathing. This information is not intended to replace advice given to you by your health care provider. Make sure you discuss any questions you have with your health care provider. Document Released: 05/01/2005 Document Revised: 11/26/2015 Document Reviewed: 06/25/2015 Elsevier Interactive Patient Education  2018 [employer]  

## 2017-07-24 NOTE — Progress Notes (Signed)
[facility] Progress Note   [name] [id number] December 24, 1960 57 y.o.  Nicholas Lowery is managed by Dr. Truitt Merle  Actively treated with chemotherapy: yes  Current Therapy: 5-FU and Avastin  Last Treated: 07/12/2017  Assessment: Plan:    Dehydration - Plan: 0.9 %  sodium chloride infusion, dexamethasone (DECADRON) injection 10 mg, acetaminophen (TYLENOL) tablet 650 mg  Non-intractable vomiting with nausea, unspecified vomiting type - Plan: ondansetron (ZOFRAN) injection 8 mg, dexamethasone (DECADRON) injection 10 mg, acetaminophen (TYLENOL) tablet 650 mg, DISCONTINUED: ondansetron (ZOFRAN) 8 mg in sodium chloride 0.9 % 50 mL IVPB, DISCONTINUED: dexamethasone (DECADRON) 10 mg in sodium chloride 0.9 % 50 mL IVPB  Diarrhea, unspecified type - Plan: diphenoxylate-atropine (LOMOTIL) 2.5-0.025 MG per tablet 2 tablet  Cough  Fever, unspecified fever cause - Plan: Influenza panel by PCR (type A & B)  Myalgia - Plan: Influenza panel by PCR (type A & B), acetaminophen (TYLENOL) tablet 650 mg  Port catheter in place - Plan: heparin lock flush 100 unit/mL, sodium chloride flush (NS) 0.9 % injection 10 mL  Metastatic colon cancer to liver (HCC) - Plan: heparin lock flush 100 unit/mL, sodium chloride flush (NS) 0.9 % injection 10 mL   Dehydration: The patient was given 1 L of normal saline IV x1 today.  Nausea and vomiting: Patient was given Zofran 8 mg IV and Decadron 10 mg IV.  He was told to use sublingual Ativan when he returns home tonight.  Diarrhea: The patient was given Lomotil 2.5-0.025 mg, 2 tablets p.o. X1.  He has Lomotil and Imodium at home.  Cough, fever, headache and myalgias: The patient was given Tylenol 650 mg p.o. x1.  A flu screen was ordered.  The results are pending.  Metastatic colon cancer: Patient is status post his most recent cycle of 5-FU and Avastin.  He will see [name] on 08/09/2017 for his next follow-up visit.  Please see  After Visit Summary for patient specific instructions.  Future Appointments  Date Time Provider [place]  08/09/2017 10:30 AM [facility] LAB 3 [facility] None  08/09/2017 10:45 AM [facility] INJ NURSE [facility] None  08/09/2017 11:15 AM Truitt Merle, MD [facility] None  08/09/2017 12:00 PM [facility] H31 [facility] None  08/11/2017  1:30 PM [facility] INJ NURSE [facility] None  08/23/2017  9:45 AM [facility] LAB 4 [facility] None  08/23/2017 10:00 AM [facility] FLUSH NURSE [facility] None  08/23/2017 10:30 AM Alla Feeling, NP [facility] None  08/23/2017 11:15 AM [facility] F19 [facility] None  08/25/2017 12:30 PM [facility] INJ NURSE [facility] None    Orders Placed This Encounter  Procedures  . Influenza panel by PCR (type A & B)       Subjective:   Patient ID:  Nicholas Lowery is a 57 y.o. (DOB [birth year]) male.  Chief Complaint:  Chief Complaint  Patient presents with  . Emesis    HPI [name] is a 57 year old male with a diagnosis of a metastatic colon cancer with metastatic disease to the liver. He was last seen by Dr. Truitt Merle on 07/12/2017.  His last PET scan was completed on 07/09/2017 and was stable but did show a new small nodule in the lung which was indeterminate.  Otherwise there were no findings of hypermetabolic recurrent or metastatic disease.  Dr. Truitt Merle recommended that the patient continue on his 5-FU pump and Avastin.  Patient developed diarrhea and vomiting around 330 this morning.  His vomiting is stopped.  His diarrhea continues.  He has not  taken anything for this as he has been vomiting.  He also has had a fever.  His  temperature is 100.4 now.  No one else at his home has been sick.  He is not eaten anywhere different.  He did have a flu shot this year.  Medications: I have reviewed the patient's current medications.  Allergies:  Allergies  Allergen Reactions  . Penicillins Other (See Comments)    UNSPECIFIED  REACTION   Has patient had a PCN reaction causing immediate rash, facial/tongue/throat swelling, SOB or lightheadedness with hypotension: unknown Has patient had a PCN reaction causing severe rash involving mucus membranes or skin necrosis: unknown Has patient had a PCN reaction that required hospitalization unknown Has patient had a PCN reaction occurring within the last 10 years: No If all of the above answers are "NO", then may proceed with Cephalosporin use.   [name] [Capecitabine] Other (See Comments)    Foot pain, couldn't walk, bleeding gums, vomiting    Past Medical History:  Diagnosis Date  . Anxiety   . Cancer of ascending colon ([place])   . Seasonal allergies     Past Surgical History:  Procedure Laterality Date  . APPENDECTOMY  1992  . CATARACT EXTRACTION W/ INTRAOCULAR LENS IMPLANT Left 04/2016  . COLON SURGERY    . COLONOSCOPY W/ BIOPSIES AND POLYPECTOMY  04/2015  . INGUINAL HERNIA REPAIR Right 1982  . IR RADIOLOGIST EVAL & MGMT  01/16/2017  . KNEE ARTHROSCOPY W/ MENISCECTOMY Right 1998  . LAPAROSCOPIC RIGHT COLECTOMY N/A 06/14/2016   Procedure: LAPAROSCOPIC HAND ASSISTED HEMICOLECTOMY AND SMALL BOWEL RESECTION.;  Surgeon: [name], MD;  Location: [facility];  Service: General;  Laterality: N/A;  . LIVER BIOPSY Right 06/14/2016   Procedure: LIVER BIOPSY;  Surgeon: [name], MD;  Location: [facility];  Service: General;  Laterality: Right;  Right Inferior Liver  . PORTACATH PLACEMENT N/A 07/06/2016   Procedure: INSERTION PORT-A-CATH;  Surgeon: [name], MD;  Location: [facility];  Service: General;  Laterality: N/A;  . VASECTOMY  2005    Family History  Problem Relation Age of Onset  . Cancer Mother        lung cancer  . Stroke Mother   . Hypertension Father   . CAD Father   . Cancer Maternal Grandfather        prostate cancer     Social History   Socioeconomic History  . Marital status: Married    Spouse name: Not on file  . Number of  children: Not on file  . Years of education: Not on file  . Highest education level: Not on file  Social Needs  . Financial resource strain: Not on file  . Food insecurity - worry: Not on file  . Food insecurity - inability: Not on file  . Transportation needs - medical: Not on file  . Transportation needs - non-medical: Not on file  Occupational History  . Not on file  Tobacco Use  . Smoking status: Former Smoker    Years: 2.00    Types: Cigarettes    Last attempt to quit: 1990    Years since quitting: 29.2  . Smokeless tobacco: Never Used  Substance and Sexual Activity  . Alcohol use: Yes    Alcohol/week: 2.4 oz    Types: 4 Cans of beer per week    Comment: social, none since colon surgery  . Drug use: No    Comment: 06/15/2016 "nothing since college"  . Sexual  activity: Yes  Other Topics Concern  . Not on file  Social History Narrative  . Not on file    Past Medical History, Surgical history, Social history, and Family history were reviewed and updated as appropriate.   Please see review of systems for further details on the patient's review from today.   Review of Systems:  Review of Systems  Constitutional: Positive for appetite change, chills, fatigue and fever. Negative for diaphoresis.  HENT: Positive for rhinorrhea. Negative for sneezing and trouble swallowing.   Respiratory: Positive for cough. Negative for choking, chest tightness, shortness of breath and wheezing.   Cardiovascular: Negative for chest pain and palpitations.  Gastrointestinal: Positive for diarrhea, nausea and vomiting. Negative for constipation.  Neurological: Positive for headaches.    Objective:   Physical Exam:  BP 100/61   Pulse (!) 112   Temp 97.6 F (36.4 C)   Resp 18   Ht 5\' 11"  (1.803 m)   SpO2 98%   BMI 27.41 kg/m  ECOG: 0  Physical Exam  Constitutional: He appears distressed.  HENT:  Head: Normocephalic and atraumatic.  Mouth/Throat: Mucous membranes are not pale, dry  and not cyanotic. No oral lesions. No oropharyngeal exudate.  Neck: Normal range of motion. Neck supple.  Cardiovascular: Normal rate, regular rhythm and normal heart sounds. Exam reveals no gallop and no friction rub.  No murmur heard. Pulmonary/Chest: Effort normal and breath sounds normal. No respiratory distress. He has no wheezes. He has no rales.  Abdominal: Soft. He exhibits no distension. Bowel sounds are increased. There is no tenderness. There is no rebound and no guarding.  Lymphadenopathy:    He has no cervical adenopathy.  Neurological: He is alert.  Skin: Skin is warm and dry. No rash noted. He is not diaphoretic. No erythema.    Lab Review:     Component Value Date/Time   NA 138 07/24/2017 1418   NA 139 05/17/2017 0931   K 4.5 07/24/2017 1418   K 4.9 05/17/2017 0931   CL 110 (H) 07/24/2017 1418   CO2 15 (L) 07/24/2017 1418   CO2 27 05/17/2017 0931   GLUCOSE 133 07/24/2017 1418   GLUCOSE 86 05/17/2017 0931   BUN 25 07/24/2017 1418   BUN 16.8 05/17/2017 0931   CREATININE 1.79 (H) 07/24/2017 1418   CREATININE 1.1 05/17/2017 0931   CALCIUM 9.8 07/24/2017 1418   CALCIUM 9.8 05/17/2017 0931   PROT 7.4 07/24/2017 1418   PROT 7.0 05/17/2017 0931   ALBUMIN 4.1 07/24/2017 1418   ALBUMIN 4.1 05/17/2017 0931   AST 18 07/24/2017 1418   AST 17 05/17/2017 0931   ALT 18 07/24/2017 1418   ALT 16 05/17/2017 0931   ALKPHOS 42 07/24/2017 1418   ALKPHOS 44 05/17/2017 0931   BILITOT 1.1 07/24/2017 1418   BILITOT 0.57 05/17/2017 0931   GFRNONAA 41 (L) 07/24/2017 1418   GFRAA 48 (L) 07/24/2017 1418       Component Value Date/Time   WBC 7.5 07/24/2017 1418   WBC 3.9 (L) 07/12/2017 0915   RBC 4.67 07/24/2017 1418   HGB 13.1 07/12/2017 0915   HGB 13.4 05/17/2017 0931   HCT 45.4 07/24/2017 1418   HCT 40.5 05/17/2017 0931   PLT 176 07/24/2017 1418   PLT 119 (L) 05/17/2017 0931   MCV 97.2 07/24/2017 1418   MCV 96.9 05/17/2017 0931   MCH 32.7 07/24/2017 1418   MCHC 33.7  07/24/2017 1418   RDW 16.7 (H) 07/24/2017 1418   RDW  15.3 (H) 05/17/2017 0931   LYMPHSABS 0.1 (L) 07/24/2017 1418   LYMPHSABS 0.7 (L) 05/17/2017 0931   MONOABS 0.6 07/24/2017 1418   MONOABS 0.5 05/17/2017 0931   EOSABS 0.0 07/24/2017 1418   EOSABS 0.1 05/17/2017 0931   BASOSABS 0.0 07/24/2017 1418   BASOSABS 0.0 05/17/2017 0931   -------------------------------  Imaging from last 24 hours (if applicable):  Radiology interpretation: Nm Pet Image Restag (ps) Skull Base To Thigh  Result Date: 07/09/2017 CLINICAL DATA:  Subsequent treatment strategy for metastatic colon cancer to the liver. Ongoing chemotherapy. Restaging. EXAM: NUCLEAR MEDICINE PET SKULL BASE TO THIGH TECHNIQUE: 9.6 mCi F-18 FDG was injected intravenously. Full-ring PET imaging was performed from the skull base to thigh after the radiotracer. CT data was obtained and used for attenuation correction and anatomic localization. FASTING BLOOD GLUCOSE:  Value: 98 mg/dl COMPARISON:  12/19/2016 PET.  Diagnostic CTs 04/10/2017. FINDINGS: NECK: No areas of abnormal hypermetabolism.  No cervical adenopathy. CHEST: No pulmonary parenchymal or thoracic nodal hypermetabolism identified. Left Port-A-Cath terminates at the mid SVC. Mild cardiomegaly with LAD coronary artery atherosclerosis. Ascending aorta upper normal at 4.0 cm. 4 mm subpleural right middle lobe pulmonary nodule is similar, including on image 49/8. A right lower lobe 5 mm pulmonary nodule on image 45/8 is not readily apparent on the prior PET and favored to measure 4 mm on the prior diagnostic CT. Also felt to be new since 09/04/2016 diagnostic CT. Below PET resolution. ABDOMEN/PELVIS: No abdominopelvic nodal or parenchymal hypermetabolism. Surgical changes of right hemicolectomy. SKELETON: Right gluteal muscular activity is mild and may be posttraumatic. No suspicious marrow hypermetabolism or focal osseous lesion. IMPRESSION: 1. Status post right hemicolectomy, without findings  of hypermetabolic recurrent or metastatic disease. 2. A right middle lobe pulmonary nodule is unchanged. However, there is a right lower lobe 5 mm pulmonary nodule which is felt to be new and enlarged compared to prior exams. Suspicious for an isolated pulmonary metastasis. Consider CT follow-up at 3-6 months. 3. Age advanced coronary artery atherosclerosis. Recommend assessment of coronary risk factors and consideration of medical therapy. 4. Borderline ascending aortic dilatation, 4.0 cm. Electronically Signed   By: Abigail Miyamoto M.D.   On: 07/09/2017 09:27

## 2017-07-26 ENCOUNTER — Other Ambulatory Visit: Payer: BLUE CROSS/BLUE SHIELD

## 2017-07-26 ENCOUNTER — Ambulatory Visit: Payer: BLUE CROSS/BLUE SHIELD | Admitting: Hematology

## 2017-07-26 ENCOUNTER — Ambulatory Visit: Payer: BLUE CROSS/BLUE SHIELD

## 2017-08-07 NOTE — Progress Notes (Signed)
[place]  [phone] Fax:([phone]) [phone]  Clinic Follow Up Note  Patient Care Team: Curlene Labrum, MD as PCP - General (Family Medicine)   Date of Service:  08/09/2017   CHIEF COMPLAINTS:  Follow up metastatic right colon cancer   Oncology History   Cancer Staging Metastatic colon cancer to liver [facility]) Staging form: Colon and Rectum, AJCC 8th Edition - Clinical stage from 06/01/2016: Stage IVA (cTX, cNX, pM1a) - Signed by Truitt Merle, MD on 07/04/2016 - Pathologic stage from 06/14/2016: Stage IVA (pT4b(m), pN2b, pM1a) - Signed by Truitt Merle, MD on 07/04/2016       Metastatic colon cancer to liver ([place])   04/2015 Procedure    Colonoscopy by Dr. Ladona Horns. It showed showed 2 sessile polyps ready between 3-5 mm in size located 20 cm (A, B) from the point of entry, polypectomy was performed. Pedunculated polyp was found in the ascending colon (C), polypectomy was performed, and additional polyp (D) was found 30 cm from the point of entry, removed      04/2015 Pathology Results    tubular adenoma (A and B), and well differentiated adenocarcinoma arising from tubulovillous adenoma (C) and well differentiated adenocarcinoma arising from severe dysplasia to intramucosal carcinoma within tubular adenoma.       04/2015 Initial Diagnosis    Metastatic colon cancer to liver ([place])      05/29/2016 Imaging    CT abdomen and pelvis with contrast showed an apple core like stricture in right colon just above the cecum, measuring 3.2 cm in lengths. This is highly suspicious for malignancy. Small lymph node a noticed he had adjacent mesentery, measuring 8 mm. There is a low-density lesion within the inferior aspect of the right hepatic lobe measuring 1.7 cm, suspicious for metastasis.       06/06/2016 Tumor Marker    CEA 9.99      06/08/2016 PET scan    IMPRESSION: Approximately 3 cm hypermetabolic mass in the ascending colon, consistent with primary colon carcinoma.  This mass results in colonic obstruction and small bowel dilatation. Additional areas of hypermetabolic wall thickening in the cecum may represent other sites of colon carcinoma or colitis. Mild hypermetabolic lymphadenopathy in right pericolonic region, porta hepatis, and aortocaval space, consistent with metastatic disease. Mild hypermetabolic mediastinal lymphadenopathy also seen, and thoracic lymph node metastases cannot be excluded. Solitary hypermetabolic focus in inferior right hepatic lobe, consistent with liver metastasis. Consider abdomen MRI without and with contrast for further evaluation.      06/14/2016 Surgery    Hand assisted right hemicolectomy and small bowel resection for colon cancer, liver biopsy, by Dr. Barry Dienes      06/14/2016 Pathology Results    Right hemicolectomy showed invasive well to moderately differentiated adenocarcinoma, 2 foci measuring 7.5 cm and 4.5 cm, tumor invades through full thickness of colon, to the seroma and involve the Small Bowel, Surgical Margins Were Negative, 24 Out Of 64 Lymph Nodes Were Positive, Extracapsular Extension Identified, Multiple Satellite Tumor Deposits Present, Liver Biopsy Showed Metastatic Adenocarcinoma.        06/14/2016 Miscellaneous    Tumor MMR normal, MSI stable       06/14/2016 Miscellaneous    Foundation one genomic testing showed K-ras G12 D mutation, APC and TP53 mutation. No BRAF and NRAS mutation. MSI-stable, tumor burden low.      07/06/2016 Tumor Marker    CEA 13.69      07/13/2016 - 01/18/2017 Chemotherapy    mFOLFOX, every 2  weeks, started on 07/14/2015, Avastin added from cycle 3  Oxaliplatin dose to 70m/m2 due to side effects and some cytopenia on 09/21/16  Changed to FOLFIRINOX starting cycle 7 and Reduced  Due to neuropathy hold Oxaliplatin and add Irinotecan with neulasta on day 3 starting with cycle 7 Add low dose Oxaliplatin with cycle 8.  Due to his worsening neuropathy, and good response to  chemotherapy, I previously stopped oxaliplatin from cycle 11, and continue FOLFIRI and avastin         07/27/2016 Tumor Marker    CEA 15.85      09/04/2016 Imaging    Ct C/A/P W Contrast IMPRESSION: Interval right colectomy. Stable small liver metastasis in the inferior right hepatic lobe. Stable mild porta hepatis and aortocaval lymphadenopathy. Stable mild mediastinal lymphadenopathy. No new or progressive metastatic disease identified within the chest, abdomen, or pelvis.      12/19/2016 PET scan    IMPRESSION: 1. Right hemicolectomy, with resolution of the prior hypermetabolic activity inferiorly in the right hepatic lobe, in several mediastinal lymph nodes, and in lymph nodes in the retroperitoneum and porta hepatis. No residual hypermetabolic or enlarged lymph nodes are identified. 2. Low-grade diffuse skeletal metabolic activity is likely therapy related. 3. Coronary atherosclerosis. 4. 3 by 4 mm right middle lobe pulmonary nodule is stable, not appreciably hypermetabolic, but below sensitive PET-CT size thresholds. This may warrant surveillance.      01/11/2017 Imaging    MR Abdomen W WO Contrast IMPRESSION: 1. No acute findings within the abdomen. Previously noted liver metastasis has resolved in the interval. No new lesions.      02/01/2017 -  Chemotherapy    Maintenance therapy, Xeloda 20051m(1000 mg/m2)  q12h on day 1-14 every 21 days plus AVASTIN, starting 02/01/2017.  stopped after 12 days due to poor tolerance on 02/14/17  Changed to maintenance 5-FU and avastin every 2 weeks starting on 02/22/17        04/10/2017 Imaging    IMPRESSION: 1. Status post right hemicolectomy without findings for recurrent tumor. 2. No worrisome hepatic lesions. Treated disease with only a small residual low attenuation lesion in the right hepatic lobe. 3. No recurrent mediastinal or abdominal lymphadenopathy.       07/09/2017 PET scan    PET 07/09/17 IMPRESSION: 1. Status  post right hemicolectomy, without findings of hypermetabolic recurrent or metastatic disease. 2. A right middle lobe pulmonary nodule is unchanged. However, there is a right lower lobe 5 mm pulmonary nodule which is felt to be new and enlarged compared to prior exams. Suspicious for an isolated pulmonary metastasis. Consider CT follow-up at 3-6 months. 3. Age advanced coronary artery atherosclerosis. Recommend assessment of coronary risk factors and consideration of medical therapy. 4. Borderline ascending aortic dilatation, 4.0 cm         HISTORY OF PRESENTING ILLNESS (06/01/2016):  Nicholas Bode89.o. male is here because of his recently abdominal CT which is highly suspicious for metastatic colon cancer. He is accompanied by his wife to my clinic today. He was referred by his primary care physician Dr. BuPleas Koch  He had colonoscopy in 2016 for mild rectal bleeding, he describe small amount fresh blood mixed with stool, he has no other constitutional symptoms at that time. He was referred to gastroenterologist Dr. CaLadona Hornsnd underwent a colonoscopy in December 2016. The colonoscopy showed 2 sessile polyps ready between 3-5 mm in size located 20 cm (A, B) from the point of entry, polypectomy was performed. Pedunculated polyp  was found in the ascending colon (C), polypectomy was performed, and additional polyp (D) was found 30 cm from the point of entry, removed. The pathology reviewed tubular adenoma (A and B), and well differentiated adenocarcinoma arising from tubulovillous adenoma (C) and well differentiated adenocarcinoma arising from severe dysplasia to intramucosal carcinoma within tubular adenoma. Dr. Tye Maryland tried multiple times to reach patient, but patient sought they were calling him about the bill, and did not return the phone calls. He was not aware the cancer diagnosis until recently.   He started having diarrhea and vomiting in mid Dec 2017, and felt a "pop" in right side  abdomen, he was seen at urgent care, and was treated with antiemetics, and IVF, lab tests were OK. Due to his persistent intermittent diarrhea and epigastric pain since then, he was seen by PCP and he eventually had CT abdomen and pelvis scan which showed a upper core lesion in the ascending colon, and I'll 1.7 cm lesion in the liver, highly suspicious for metastasis. He was referred to [place] for further evaluation.  He has lost 30 lbs in the past one month, has low appetite, eats a small meals 1-2 times a day. He has moderate fatigue, able to tolerate routine activities including his work, but feels exhausted at the end of study. He has occasional constipation, denies recent rectal bleeding.  CURRENT THERAPY:  Maintenance 5-Fu and Avastin started 01/04/2017, he tried one cycle Xeloda to replace 5-fu, but could not tolerate. Added leucovorin and decreased 5-Fu 10% starting on 03/15/17.  INTERIM HISTORY:  [name] returns today for follow up. Since his last visit he presented to symptom management clinic on 07/24/17 for non-intractable vomiting, diarrhea, cough, myalgia and fever. He was given IV Fluids, zofran, lomotil and Tylenol. Flu screening was done and came back negative. He notes since then his nasal passage will drip clear randomly. This has been going on for 3 weeks now. He has allergy medication he can try.   Today pt notes he feel better having 2 weeks off. He notes he was able to see the counselor to talk about his mood. He thinks his Effexor is helping him as well. He denies issues with medication. He notes he wants his next cycle to be pushed back a week because he would like to go to a [employer]. Pt also notes having a vacation in 10/2017.    On review of symptoms, pt notes ongoing nasal dripping.    MEDICAL HISTORY:  Past Medical History:  Diagnosis Date  . Anxiety   . Cancer of ascending colon ([place])   . Seasonal allergies     SURGICAL HISTORY: Past Surgical History:    Procedure Laterality Date  . APPENDECTOMY  1992  . CATARACT EXTRACTION W/ INTRAOCULAR LENS IMPLANT Left 04/2016  . COLON SURGERY    . COLONOSCOPY W/ BIOPSIES AND POLYPECTOMY  04/2015  . INGUINAL HERNIA REPAIR Right 1982  . IR RADIOLOGIST EVAL & MGMT  01/16/2017  . KNEE ARTHROSCOPY W/ MENISCECTOMY Right 1998  . LAPAROSCOPIC RIGHT COLECTOMY N/A 06/14/2016   Procedure: LAPAROSCOPIC HAND ASSISTED HEMICOLECTOMY AND SMALL BOWEL RESECTION.;  Surgeon: [name], MD;  Location: [place];  Service: General;  Laterality: N/A;  . LIVER BIOPSY Right 06/14/2016   Procedure: LIVER BIOPSY;  Surgeon: [name], MD;  Location: [place];  Service: General;  Laterality: Right;  Right Inferior Liver  . PORTACATH PLACEMENT N/A 07/06/2016   Procedure: INSERTION PORT-A-CATH;  Surgeon: [name], MD;  Location: [facility]  [place];  Service: General;  Laterality: N/A;  . VASECTOMY  2005    SOCIAL HISTORY: Social History   Socioeconomic History  . Marital status: Married    Spouse name: Not on file  . Number of children: Not on file  . Years of education: Not on file  . Highest education level: Not on file  Occupational History  . Not on file  Social Needs  . Financial resource strain: Not on file  . Food insecurity:    Worry: Not on file    Inability: Not on file  . Transportation needs:    Medical: Not on file    Non-medical: Not on file  Tobacco Use  . Smoking status: Former Smoker    Years: 2.00    Types: Cigarettes    Last attempt to quit: 1990    Years since quitting: 29.2  . Smokeless tobacco: Never Used  Substance and Sexual Activity  . Alcohol use: Yes    Alcohol/week: 2.4 oz    Types: 4 Cans of beer per week    Comment: social, none since colon surgery  . Drug use: No    Comment: 06/15/2016 "nothing since college"  . Sexual activity: Yes  Lifestyle  . Physical activity:    Days per week: Not on file    Minutes per session: Not on file  . Stress: Not on file  Relationships   . Social connections:    Talks on phone: Not on file    Gets together: Not on file    Attends religious service: Not on file    Active member of club or organization: Not on file    Attends meetings of clubs or organizations: Not on file    Relationship status: Not on file  . Intimate partner violence:    Fear of current or ex partner: Not on file    Emotionally abused: Not on file    Physically abused: Not on file    Forced sexual activity: Not on file  Other Topics Concern  . Not on file  Social History Narrative  . Not on file   He is married. They have 3 boys, 62-12 yo. He works for a [employer], desk job.   FAMILY HISTORY: Family History  Problem Relation Age of Onset  . Cancer Mother        lung cancer  . Stroke Mother   . Hypertension Father   . CAD Father   . Cancer Maternal Grandfather        prostate cancer     ALLERGIES:  is allergic to penicillins and xeloda [capecitabine].  MEDICATIONS:  Current Outpatient Medications  Medication Sig Dispense Refill  . amLODipine (NORVASC) 10 MG tablet Take 1 tablet (10 mg total) by mouth daily. 30 tablet 2  . celecoxib (CELEBREX) 200 MG capsule Take 200 mg by mouth daily.  1  . lisinopril (PRINIVIL) 10 MG tablet Take 1 tablet (10 mg total) by mouth daily. 90 tablet 2  . Multiple Vitamin (MULTIVITAMIN WITH MINERALS) TABS tablet Take 1 tablet by mouth daily.    . traMADol (ULTRAM) 50 MG tablet Take by mouth 2 (two) times daily.    Marland Kitchen venlafaxine XR (EFFEXOR-XR) 75 MG 24 hr capsule Take 1 capsule (75 mg total) by mouth daily with breakfast. 30 capsule 1  . dexamethasone (DECADRON) 4 MG tablet For 5 days after chemo (begin day 2) (Patient not taking: Reported on 08/09/2017) 10 tablet 0  . diphenoxylate-atropine (LOMOTIL)  2.5-0.025 MG tablet Take 2 tablets by mouth 4 (four) times daily as needed for diarrhea or loose stools. (Patient not taking: Reported on 08/09/2017) 30 tablet 0  . lidocaine-prilocaine (EMLA) cream  Apply to affected area once 30 g 3  . LORazepam (ATIVAN) 0.5 MG tablet Take 1 tablet (0.5 mg total) by mouth every 8 (eight) hours as needed (for nausea). 30 tablet 0  . ondansetron (ZOFRAN ODT) 8 MG disintegrating tablet Take 1 tablet (8 mg total) by mouth every 8 (eight) hours as needed for nausea or vomiting. 30 tablet 2  . prochlorperazine (COMPAZINE) 10 MG tablet Take 1 tablet (10 mg total) by mouth every 6 (six) hours as needed for nausea or vomiting. 30 tablet 3  . traMADol (ULTRAM) 50 MG tablet Take 1 tablet (50 mg total) by mouth every 6 (six) hours as needed. 30 tablet 0  . urea (CARMOL) 10 % cream Apply topically 3 (three) times daily. (Patient not taking: Reported on 07/24/2017) 71 g 1   No current facility-administered medications for this visit.    Facility-Administered Medications Ordered in Other Visits  Medication Dose Route Frequency Provider Last Rate Last Dose  . clindamycin (CLEOCIN) 900 mg in dextrose 5 % 50 mL IVPB  900 mg Intravenous 60 min Pre-Op [name], MD       And  . gentamicin (GARAMYCIN) 450 mg in dextrose 5 % 50 mL IVPB  5 mg/kg Intravenous 60 min Pre-Op [name], MD      . dexamethasone (DECADRON) injection 10 mg  10 mg Intravenous Once Harle Stanford., PA-C      . diphenoxylate-atropine (LOMOTIL) 2.5-0.025 MG per tablet 2 tablet  2 tablet Oral Once Harle Stanford., PA-C      . fluorouracil (ADRUCIL) 4,600 mg in sodium chloride 0.9 % 58 mL chemo infusion  2,200 mg/m2 (Treatment Plan Recorded) Intravenous 1 day or 1 dose Truitt Merle, MD      . heparin lock flush 100 unit/mL  500 Units Intracatheter Once PRN Truitt Merle, MD      . heparin lock flush 100 unit/mL  500 Units Intracatheter Once PRN Truitt Merle, MD      . sodium chloride flush (NS) 0.9 % injection 10 mL  10 mL Intracatheter PRN Truitt Merle, MD      . sodium chloride flush (NS) 0.9 % injection 10 mL  10 mL Intracatheter PRN Truitt Merle, MD         REVIEW OF SYSTEMS:  Constitutional: Denies fevers,  chills or abnormal night sweats (+)improved energy  Eyes: Denies blurriness of vision, double vision or watery eyes Ears, nose, mouth, throat, and face: (+) ongoing nasal dripping Respiratory:negative  Cardiovascular: Denies palpitation, chest discomfort or lower extremity swelling Gastrointestinal:  Denies heartburn or change in bowel habits   Lymphatics: Denies new lymphadenopathy or easy bruising Neurological: negative MSK: Muscle weakness in hands Behavioral/Psych: Mood is stable, no new changes (+) Depression, anxiety, moody swings, improved  All other systems were reviewed with the patient and are negative.  PHYSICAL EXAMINATION:  ECOG PERFORMANCE STATUS: 1 - Symptomatic but completely ambulatory  Vitals:   08/09/17 1133  BP: 118/89  Pulse: 68  Resp: 20  Temp: 98.6 F (37 C)  TempSrc: Oral  SpO2: 100%  Weight: 201 lb 9.6 oz (91.4 kg)  Height: _0  (1.803 m)     GENERAL:alert, no distress and comfortable. SKIN: skin color, texture, turgor are normal, no rashes or significant lesions EYES: normal, conjunctiva are  pink and non-injected, sclera clear OROPHARYNX:no exudate, no erythema and lips, buccal mucosa, and tongue normal   NECK: supple, thyroid normal size, non-tender, without nodularity LYMPH:  no palpable lymphadenopathy in the cervical, axillary or inguinal LUNGS: clear to auscultation and percussion with normal breathing effort HEART: regular rate & rhythm and no murmurs and no lower extremity edema ABDOMEN:abdomen soft, Surgical scar in the midline around the umbilical has healed well. non-tender and normal bowel sounds Musculoskeletal:no cyanosis of digits and no clubbing  PSYCH: alert & oriented x 3 with fluent speech NEURO: no focal motor/sensory deficits   LABORATORY DATA:  I have reviewed the data as listed CBC Latest Ref Rng & Units 08/09/2017 07/24/2017 07/12/2017  WBC 4.0 - 10.3 K/uL 4.7 7.5 3.9(L)  Hemoglobin 13.0 - 17.1 g/dL - - 13.1  Hematocrit  38.4 - 49.9 % 38.4 45.4 39.3  Platelets 140 - 400 K/uL 143 176 133(L)   CMP Latest Ref Rng & Units 08/09/2017 07/24/2017 07/12/2017  Glucose 70 - 140 mg/dL 131 133 74  BUN 7 - 26 mg/dL _0 Creatinine 0.70 - 1.30 mg/dL 1.05 1.79(H) 1.08  Sodium 136 - 145 mmol/L 139 138 139  Potassium 3.5 - 5.1 mmol/L 4.2 4.5 4.6  Chloride 98 - 109 mmol/L 106 110(H) 104  CO2 22 - 29 mmol/L 24 15(L) 25  Calcium 8.4 - 10.4 mg/dL 9.4 9.8 9.8  Total Protein 6.4 - 8.3 g/dL 6.7 7.4 7.1  Total Bilirubin 0.2 - 1.2 mg/dL 0.7 1.1 0.7  Alkaline Phos 40 - 150 U/L 48 42 46  AST 5 - 34 U/L _1 ALT 0 - 55 U/L _2 PATHOLOGY   Diagnosis 06/14/2016 1. Colon, segmental resection for tumor, Right Ascending Hemicolectomy and Small Bowel - INVASIVE WELL TO MODERATELY DIFFERENTIATED ADENOCARCINOMA. - TWO TUMOR FOCI MEASURING 7.5 CM AND 4.5 CM IN GREATEST DIMENSION. - TUMOR INVADES THROUGH FULL THICKNESS OF COLON, THROUGH THE SEROSA TO INVOLVE THE SMALL BOWEL. - MARGINS ARE NEGATIVE. - TWENTY FOUR OF SIXTY FOUR LYMPH NODES POSITIVE FOR METASTATIC ADENOCARCINOMA (24/64). - EXTRACAPSULAR EXTENSION IDENTIFIED - MULTIPLE SATELLITE TUMOR DEPOSITS PRESENT. - SEE ONCOLOGY TEMPLATE. 2. Liver, needle/core biopsy, Right Inferior - POSITIVE FOR METASTATIC ADENOCARCINOMA.    RADIOGRAPHIC STUDIES: I have personally reviewed his outside CT scan from 05/29/2016 and agreed with the findings in the report. No results found. MR Abdomen W WO Contrast 01/11/2017 IMPRESSION: 1. No acute findings within the abdomen. Previously noted liver metastasis has resolved in the interval. No new lesions.  CT CAP 09/04/16 IMPRESSION: Interval right colectomy. Stable small liver metastasis in the inferior right hepatic lobe. Stable mild porta hepatis and aortocaval lymphadenopathy. Stable mild mediastinal lymphadenopathy. No new or progressive metastatic disease identified within the chest, abdomen, or pelvis.  ASSESSMENT &  PLAN:  57 y.o. Caucasian male, without significant past medical history, presented with rectal bleeding in December 2016, colonoscopy showed 4 polyps, 2 of them showed invasive adenocarcinoma, unfortunately he was not aware of the pathology findings and was not treated. He now presented with fatigue, weight loss, anorexia and abdominal pain  1. Right colon cancer with liver and node metastasis, pT4bN2bM1a, stage IV, MSI-stable, KRAS G12D mutation (+) -I previously reviewed her PET scan findings, which showed solitary liver metastasis, abdominal and possible thoracic node metastasis  -His liver biopsy confirmed metastasis -Due to the bowel obstruction, he underwent upfront hemicolectomy. I previously reviewed his surgical pathology findings, which showed 2 primary right colon  cancer, very locally advanced with 24 lymph nodes positive, surgical margins were negative. -I previously discussed his Foundation one genomic testing results, which showed care arrest mutation, and as high stable, low tumor burden. So he would not benefit from EGFR inhibitor, or immunotherapy alone. -The was started on palliative first-line chemotherapy FOLFOX on 07/13/16. Avastin was added from cycle 3 -He has solitary liver metastasis, but multiple mild hypermetabolic adenopathy in mediastinum and abdomen. We previously discussed the possible local therapy options, such as liver metastectomy or ablation if he has great response to chemo and his node metastasis resolves  -restaging CT from 09/04/2016 reviewed with pt, stable disease. Will continue chemo -he developed side effects from FOLFOX, and a worsening thrombocytopenia, despite reduced dose of oxaliplatin  -Due to his overall limited response to FOLFOX, and his wish to pursue surgery, I previously recommend him to change chemo to FOLFIRINOX starting with cycle 7 with low dose oxaliplatin, hopefully he will response better. -Due to his severe nausea and vomiting, anorexia, mild  diarrhea, reduced his irinotecan dose from cycle 10  - PET scan from 12/19/2016 reviewed in person. He had near complete metabolic response from chemotherapy, post his liver metastasis and hypermetabolic lymph nodes are negative on PET scan with decreased size.  -Due to his worsening neuropathy, and good response to chemotherapy, I previously stopped oxaliplatin from cycle 11, and continue FOLFIRI and avastin   -Reviewed his restaging abdominal MRI findings, which showed no visible lesions in the liver. He has had a complete radiographic response -He was seen by interventional radiologist Dr. Earleen Newport, who does not think he needed a liver ablation since the solid liver met has resolved after chemo. I agree with his plan.  -He was recently seen by surgeon Dr. Barry Dienes, who recommend maintenance chemotherapy for now, no indication for resection at this point given -Given his excellent response to chemotherapy, and the poor tolerance to intravenous chemotherapy, I recommended him to change treatments to maintenance chemotherapy therapy -He started Xeloda and Avastin maintenance therapy 02/01/17, due to very poor tolerance he stopped Xeloda 02/14/17 -he has been on  5-Fu pump and avastin starting 02/22/17.  -Patient would like to come off chemo if possible. I suggest another 3-4 months maintenance therapy and repeat a scan before discussing stopping chemo. He agreed.   -The patient favors to take chemo break if the next PET scan is completely negative. He understands his cancer is probably not cured, and possible disease progression after stopping chemo. -We discussed his PET from 07/09/17 which shows a stable and a new small nodule in the lungs, indeterminate cause at this time. Otherwise no findings of hypermetabolic recurrent or metastatic disease. Will repeat CT Chest in 2-3 months to monitor his lung nodules. I encouraged him to continue with his current maintenance chemo regimen. Per Pt request I will give him a  month of chemo break after 07/12/17 cycle of chemo.  -I discussed continuing maintenance chemo until next scan in the end of 09/2017. If scan results are good, may stop his maintenance chemo. He understands and agrees.  -Labs adequate to proceed with treatment today.   2. Depression, Anxiety  -Pt has long-standing history of depression, has been on Prozac for decades, it was very well controlled in the past.   -He has become more depressed lately, due to the stress from cancer diagnosis, especially stage IV, with prolonged chemotherapy, stress from work and family responsibility, etc.  He has developed multiple symptoms, including depressed mood, insomnia,  personality change, etc.  He also has suicidal ideas. -He has good family and social support.   -We had a long discussion about depression management.  -I previously discussed the role of antidepressants, he has been on Prozac 40 mg for several years. I suggest changing to Effexor. He has weaned off Prozac in 2 weeks. Started Effexor 34m on week two of taper, will titrate dose if needed. -I also suggested he start attending GI Cancer support group, I had GI Navigator Dawn speak with him more about this and other resources. Dawn recommend him to see psychologist AEarl Lites he has met her twice  -His mood has improved after meeting with counselor. Effexor is working for him. Will continue    3. Hypertension  -We previously discussed Avastin can cause hypertension -I previously started him on amlodipine, and increase dose to 10 mg daily -He does not want to follow-up with his primary care physician. -Previously, his blood pressure was noted to be elevated, especially diastolic. I previously added on lisinopril 10 mg daily for him -Continue Norvasc and Lisinopril for now while on Avastin -Will continue to monitor -BP better controlled with Lisinopril and amlodipine -His blood pressure was high in the infusion room, and gave him 0.1 mg clonidine  on 07/12/17 -BP normal today at 118/89, better controlled.  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 -I previously recommend him to take oral iron supplement over-the-counter, potential side effects of constipation and gastric or discomfort or discussed with him. He stopped taking this before surgery. -I previously advised him to begin taking oral iron supplements again post surgery -Hg normal and stable at 13.1 and iron study has been normal lately. Will continue to monitor.    5. Anorexia and weight loss secondary to chemo  -Secondary to underlying malignancy -I previously encouraged him to try nutritional supplement, such as boost or initial and try to eat more -He was encouraged to continue eating well and take supplemental drinks as needed to better maintain weight.  -I previously discussed how his depression can effect his appetite and to make sure he is eating and drinking well.  -weight has been increasing lately, will monitor.   6. Peripheral neuropathy, G1  -Secondary to oxaliplatin, dose reduced and ultimately discontinued -Slightly worsened when he was on FOLFIRI, held Irinotecan beginning 01/04/17 -Improved some, his hand function normalized, no balance issue, discussed with patient this may take some time to resolve.  - I strongly encouraged him to take vitamin B-complex supplements -Not mentioned in today's visit, Likely stable or improving. Will continue to monitor.   7. Goal of care discussion  -We previously discussed the incurable nature of his cancer, and the overall poor prognosis, especially if he does not have good response to chemotherapy or progress on chemo -The patient understands the goal of care is palliative.  -He is full code now     Plan  -Labs reviewed and adequate to proceed with chemo today  -Refill Tramadol today  -Postpone his lab, flush f/u and chemo from 4/11/ to 4/18 per pt's request   -Lab, flush, and chemo 5-fu and avastin on 5/2 and 5/16  -he will see Lacie on 4/18 and see me on 5/16, I will schedue restaging CT chest and abdomen/pelvis with contrast when I see him back    No orders of the defined types were placed in this encounter.   All questions were answered.  The patient knows to call the clinic with any problems, questions or concerns.  I spent 20 minutes counseling the patient face to face. The total time spent in the appointment was 25 minutes and more than 50% was on counseling.  This document serves as a record of services personally performed by Truitt Merle, MD. It was created on her behalf by Joslyn Devon, a trained medical scribe. The creation of this record is based on the scribe's personal observations and the provider's statements to them.   I have reviewed the above documentation for accuracy and completeness, and I agree with the above.   Truitt Merle  08/09/2017

## 2017-08-08 MED ORDER — ATROPINE SULFATE 1 MG/ML IJ SOLN
INTRAMUSCULAR | Status: AC
  Filled 2017-08-08: qty 1

## 2017-08-09 ENCOUNTER — Inpatient Hospital Stay: Payer: BLUE CROSS/BLUE SHIELD

## 2017-08-09 ENCOUNTER — Inpatient Hospital Stay (HOSPITAL_BASED_OUTPATIENT_CLINIC_OR_DEPARTMENT_OTHER): Payer: BLUE CROSS/BLUE SHIELD | Admitting: Hematology

## 2017-08-09 ENCOUNTER — Encounter: Payer: Self-pay | Admitting: Hematology

## 2017-08-09 VITALS — BP 118/89 | HR 68 | Temp 98.6°F | Resp 20 | Ht 71.0 in | Wt 201.6 lb

## 2017-08-09 DIAGNOSIS — E860 Dehydration: Secondary | ICD-10-CM

## 2017-08-09 DIAGNOSIS — C182 Malignant neoplasm of ascending colon: Secondary | ICD-10-CM | POA: Diagnosis not present

## 2017-08-09 DIAGNOSIS — D509 Iron deficiency anemia, unspecified: Secondary | ICD-10-CM

## 2017-08-09 DIAGNOSIS — C787 Secondary malignant neoplasm of liver and intrahepatic bile duct: Secondary | ICD-10-CM

## 2017-08-09 DIAGNOSIS — R590 Localized enlarged lymph nodes: Secondary | ICD-10-CM

## 2017-08-09 DIAGNOSIS — R630 Anorexia: Secondary | ICD-10-CM | POA: Diagnosis not present

## 2017-08-09 DIAGNOSIS — C189 Malignant neoplasm of colon, unspecified: Secondary | ICD-10-CM

## 2017-08-09 DIAGNOSIS — R634 Abnormal weight loss: Secondary | ICD-10-CM

## 2017-08-09 DIAGNOSIS — I10 Essential (primary) hypertension: Secondary | ICD-10-CM

## 2017-08-09 DIAGNOSIS — D500 Iron deficiency anemia secondary to blood loss (chronic): Secondary | ICD-10-CM

## 2017-08-09 DIAGNOSIS — C771 Secondary and unspecified malignant neoplasm of intrathoracic lymph nodes: Secondary | ICD-10-CM | POA: Diagnosis not present

## 2017-08-09 DIAGNOSIS — D696 Thrombocytopenia, unspecified: Secondary | ICD-10-CM

## 2017-08-09 DIAGNOSIS — G629 Polyneuropathy, unspecified: Secondary | ICD-10-CM

## 2017-08-09 DIAGNOSIS — R911 Solitary pulmonary nodule: Secondary | ICD-10-CM

## 2017-08-09 DIAGNOSIS — Z79899 Other long term (current) drug therapy: Secondary | ICD-10-CM

## 2017-08-09 LAB — CMP (CANCER CENTER ONLY)
ALBUMIN: 3.7 g/dL (ref 3.5–5.0)
ALT: 18 U/L (ref 0–55)
AST: 17 U/L (ref 5–34)
Alkaline Phosphatase: 48 U/L (ref 40–150)
Anion gap: 9 (ref 3–11)
BUN: 17 mg/dL (ref 7–26)
CHLORIDE: 106 mmol/L (ref 98–109)
CO2: 24 mmol/L (ref 22–29)
CREATININE: 1.05 mg/dL (ref 0.70–1.30)
Calcium: 9.4 mg/dL (ref 8.4–10.4)
GFR, Est AFR Am: >60 mL/min (ref >60)
GLUCOSE: 131 mg/dL (ref 70–140)
Potassium: 4.2 mmol/L (ref 3.5–5.1)
Sodium: 139 mmol/L (ref 136–145)
Total Bilirubin: 0.7 mg/dL (ref 0.2–1.2)
Total Protein: 6.7 g/dL (ref 6.4–8.3)

## 2017-08-09 LAB — CBC WITH DIFFERENTIAL (CANCER CENTER ONLY)
Basophils Absolute: 0.1 10*3/uL (ref 0.0–0.1)
Basophils Relative: 1 %
EOS ABS: 0.1 10*3/uL (ref 0.0–0.5)
Eosinophils Relative: 1 %
HEMATOCRIT: 38.4 % (ref 38.4–49.9)
HEMOGLOBIN: 13.1 g/dL (ref 13.0–17.1)
LYMPHS ABS: 0.8 10*3/uL — AB (ref 0.9–3.3)
LYMPHS PCT: 17 %
MCH: 32.8 pg (ref 27.2–33.4)
MCHC: 34 g/dL (ref 32.0–36.0)
MCV: 96.5 fL (ref 79.3–98.0)
Monocytes Absolute: 0.4 10*3/uL (ref 0.1–0.9)
Monocytes Relative: 9 %
NEUTROS ABS: 3.4 10*3/uL (ref 1.5–6.5)
Neutrophils Relative %: 72 %
Platelet Count: 143 10*3/uL (ref 140–400)
RBC: 3.98 MIL/uL — AB (ref 4.20–5.82)
RDW: 15.2 % — ABNORMAL HIGH (ref 11.0–14.6)
WBC: 4.7 10*3/uL (ref 4.0–10.3)

## 2017-08-09 LAB — IRON AND TIBC
Iron: 88 ug/dL (ref 42–163)
Saturation Ratios: 26 % — ABNORMAL LOW (ref 42–163)
TIBC: 331 ug/dL (ref 202–409)
UIBC: 244 ug/dL

## 2017-08-09 LAB — CEA (IN HOUSE-CHCC): CEA (CHCC-IN HOUSE): 3.81 ng/mL (ref 0.00–5.00)

## 2017-08-09 LAB — FERRITIN: Ferritin: 89 ng/mL (ref 22–316)

## 2017-08-09 MED ORDER — PALONOSETRON HCL INJECTION 0.25 MG/5ML
0.2500 mg | Freq: Once | INTRAVENOUS | Status: AC
  Administered 2017-08-09: 0.25 mg via INTRAVENOUS

## 2017-08-09 MED ORDER — SODIUM CHLORIDE 0.9 % IV SOLN
Freq: Once | INTRAVENOUS | Status: AC
  Administered 2017-08-09: 12:00:00 via INTRAVENOUS

## 2017-08-09 MED ORDER — SODIUM CHLORIDE 0.9% FLUSH
10.0000 mL | INTRAVENOUS | Status: DC | PRN
  Filled 2017-08-09: qty 10

## 2017-08-09 MED ORDER — HEPARIN SOD (PORK) LOCK FLUSH 100 UNIT/ML IV SOLN
500.0000 [IU] | Freq: Once | INTRAVENOUS | Status: DC | PRN
  Filled 2017-08-09: qty 5

## 2017-08-09 MED ORDER — SODIUM CHLORIDE 0.9 % IV SOLN
5.0000 mg/kg | Freq: Once | INTRAVENOUS | Status: AC
  Administered 2017-08-09: 450 mg via INTRAVENOUS
  Filled 2017-08-09: qty 16

## 2017-08-09 MED ORDER — SODIUM CHLORIDE 0.9 % IV SOLN
2200.0000 mg/m2 | INTRAVENOUS | Status: DC
  Administered 2017-08-09: 4600 mg via INTRAVENOUS
  Filled 2017-08-09: qty 92

## 2017-08-09 MED ORDER — DEXAMETHASONE SODIUM PHOSPHATE 10 MG/ML IJ SOLN
INTRAMUSCULAR | Status: AC
  Filled 2017-08-09: qty 1

## 2017-08-09 MED ORDER — TRAMADOL HCL 50 MG PO TABS: 50.0000 mg | ORAL_TABLET | Freq: Four times a day (QID) | ORAL | 0 refills | Status: DC | PRN

## 2017-08-09 MED ORDER — DEXTROSE 5 % IV SOLN
400.0000 mg/m2 | Freq: Once | INTRAVENOUS | Status: AC
  Administered 2017-08-09: 840 mg via INTRAVENOUS
  Filled 2017-08-09: qty 42

## 2017-08-09 MED ORDER — PALONOSETRON HCL INJECTION 0.25 MG/5ML
INTRAVENOUS | Status: AC
  Filled 2017-08-09: qty 5

## 2017-08-09 MED ORDER — DEXTROSE 5 % IV SOLN: Freq: Once | INTRAVENOUS | Status: DC

## 2017-08-09 MED ORDER — DEXAMETHASONE SODIUM PHOSPHATE 10 MG/ML IJ SOLN
8.0000 mg | Freq: Once | INTRAMUSCULAR | Status: AC
  Administered 2017-08-09: 8 mg via INTRAVENOUS

## 2017-08-09 NOTE — Patient Instructions (Addendum)
[facility] Discharge Instructions for Patients Receiving Chemotherapy  Today you received the following chemotherapy agents Avastin, Leucovorin and Adrucil  To help prevent nausea and vomiting after your treatment, we encourage you to take your nausea medication as directed. No Zofran for 3 days. Take Compazine instead.    If you develop nausea and vomiting that is not controlled by your nausea medication, call the clinic.   BELOW ARE SYMPTOMS THAT SHOULD BE REPORTED IMMEDIATELY:  *FEVER GREATER THAN 100.5 F  *CHILLS WITH OR WITHOUT FEVER  NAUSEA AND VOMITING THAT IS NOT CONTROLLED WITH YOUR NAUSEA MEDICATION  *UNUSUAL SHORTNESS OF BREATH  *UNUSUAL BRUISING OR BLEEDING  TENDERNESS IN MOUTH AND THROAT WITH OR WITHOUT PRESENCE OF ULCERS  *URINARY PROBLEMS  *BOWEL PROBLEMS  UNUSUAL RASH Items with * indicate a potential emergency and should be followed up as soon as possible.  Feel free to call the clinic should you have any questions or concerns. The clinic phone number is [phone].  Please show the [facility] at check-in to the Emergency Department and triage nurse.

## 2017-08-10 ENCOUNTER — Telehealth: Payer: Self-pay | Admitting: Hematology

## 2017-08-10 NOTE — Telephone Encounter (Signed)
Spoke with patient regarding change in pump stop appointment per 3/28 sch msg

## 2017-08-10 NOTE — Telephone Encounter (Signed)
Appointments moved per 3/28 los patient to pick up new schedule on 3/30 appt

## 2017-08-11 ENCOUNTER — Inpatient Hospital Stay: Payer: BLUE CROSS/BLUE SHIELD

## 2017-08-11 VITALS — BP 129/85 | HR 90 | Temp 98.0°F | Resp 18

## 2017-08-11 DIAGNOSIS — C189 Malignant neoplasm of colon, unspecified: Secondary | ICD-10-CM

## 2017-08-11 DIAGNOSIS — C182 Malignant neoplasm of ascending colon: Secondary | ICD-10-CM | POA: Diagnosis not present

## 2017-08-11 DIAGNOSIS — C787 Secondary malignant neoplasm of liver and intrahepatic bile duct: Principal | ICD-10-CM

## 2017-08-11 MED ORDER — HEPARIN SOD (PORK) LOCK FLUSH 100 UNIT/ML IV SOLN
500.0000 [IU] | Freq: Once | INTRAVENOUS | Status: AC | PRN
  Administered 2017-08-11: 500 [IU]
  Filled 2017-08-11: qty 5

## 2017-08-11 MED ORDER — SODIUM CHLORIDE 0.9% FLUSH
10.0000 mL | INTRAVENOUS | Status: DC | PRN
  Administered 2017-08-11: 10 mL
  Filled 2017-08-11: qty 10

## 2017-08-11 NOTE — Patient Instructions (Signed)

## 2017-08-23 ENCOUNTER — Other Ambulatory Visit: Payer: BLUE CROSS/BLUE SHIELD

## 2017-08-23 ENCOUNTER — Ambulatory Visit: Payer: BLUE CROSS/BLUE SHIELD

## 2017-08-23 ENCOUNTER — Ambulatory Visit: Payer: BLUE CROSS/BLUE SHIELD | Admitting: Nurse Practitioner

## 2017-08-25 ENCOUNTER — Other Ambulatory Visit: Payer: Self-pay | Admitting: Hematology

## 2017-08-30 ENCOUNTER — Inpatient Hospital Stay: Payer: BLUE CROSS/BLUE SHIELD

## 2017-08-30 ENCOUNTER — Encounter: Payer: Self-pay | Admitting: Nurse Practitioner

## 2017-08-30 ENCOUNTER — Inpatient Hospital Stay (HOSPITAL_BASED_OUTPATIENT_CLINIC_OR_DEPARTMENT_OTHER): Payer: BLUE CROSS/BLUE SHIELD | Admitting: Nurse Practitioner

## 2017-08-30 ENCOUNTER — Inpatient Hospital Stay: Payer: BLUE CROSS/BLUE SHIELD | Attending: Hematology

## 2017-08-30 VITALS — BP 114/85 | HR 66 | Temp 98.0°F | Resp 18 | Ht 71.0 in | Wt 205.6 lb

## 2017-08-30 DIAGNOSIS — C787 Secondary malignant neoplasm of liver and intrahepatic bile duct: Secondary | ICD-10-CM

## 2017-08-30 DIAGNOSIS — C189 Malignant neoplasm of colon, unspecified: Secondary | ICD-10-CM

## 2017-08-30 DIAGNOSIS — G629 Polyneuropathy, unspecified: Secondary | ICD-10-CM | POA: Insufficient documentation

## 2017-08-30 DIAGNOSIS — D509 Iron deficiency anemia, unspecified: Secondary | ICD-10-CM

## 2017-08-30 DIAGNOSIS — G4700 Insomnia, unspecified: Secondary | ICD-10-CM | POA: Diagnosis not present

## 2017-08-30 DIAGNOSIS — Z9049 Acquired absence of other specified parts of digestive tract: Secondary | ICD-10-CM | POA: Insufficient documentation

## 2017-08-30 DIAGNOSIS — R45851 Suicidal ideations: Secondary | ICD-10-CM | POA: Diagnosis not present

## 2017-08-30 DIAGNOSIS — R911 Solitary pulmonary nodule: Secondary | ICD-10-CM | POA: Insufficient documentation

## 2017-08-30 DIAGNOSIS — D696 Thrombocytopenia, unspecified: Secondary | ICD-10-CM | POA: Diagnosis not present

## 2017-08-30 DIAGNOSIS — T451X5A Adverse effect of antineoplastic and immunosuppressive drugs, initial encounter: Secondary | ICD-10-CM

## 2017-08-30 DIAGNOSIS — I10 Essential (primary) hypertension: Secondary | ICD-10-CM

## 2017-08-30 DIAGNOSIS — G620 Drug-induced polyneuropathy: Secondary | ICD-10-CM

## 2017-08-30 DIAGNOSIS — E860 Dehydration: Secondary | ICD-10-CM | POA: Diagnosis not present

## 2017-08-30 DIAGNOSIS — F419 Anxiety disorder, unspecified: Secondary | ICD-10-CM | POA: Insufficient documentation

## 2017-08-30 DIAGNOSIS — R590 Localized enlarged lymph nodes: Secondary | ICD-10-CM | POA: Diagnosis not present

## 2017-08-30 DIAGNOSIS — Z87891 Personal history of nicotine dependence: Secondary | ICD-10-CM | POA: Diagnosis not present

## 2017-08-30 DIAGNOSIS — R630 Anorexia: Secondary | ICD-10-CM | POA: Insufficient documentation

## 2017-08-30 DIAGNOSIS — C182 Malignant neoplasm of ascending colon: Secondary | ICD-10-CM | POA: Diagnosis present

## 2017-08-30 DIAGNOSIS — C771 Secondary and unspecified malignant neoplasm of intrathoracic lymph nodes: Secondary | ICD-10-CM | POA: Diagnosis not present

## 2017-08-30 DIAGNOSIS — Z79899 Other long term (current) drug therapy: Secondary | ICD-10-CM | POA: Diagnosis not present

## 2017-08-30 DIAGNOSIS — D500 Iron deficiency anemia secondary to blood loss (chronic): Secondary | ICD-10-CM | POA: Diagnosis not present

## 2017-08-30 DIAGNOSIS — Z5111 Encounter for antineoplastic chemotherapy: Secondary | ICD-10-CM | POA: Diagnosis not present

## 2017-08-30 DIAGNOSIS — R634 Abnormal weight loss: Secondary | ICD-10-CM

## 2017-08-30 DIAGNOSIS — R110 Nausea: Secondary | ICD-10-CM

## 2017-08-30 DIAGNOSIS — H269 Unspecified cataract: Secondary | ICD-10-CM | POA: Insufficient documentation

## 2017-08-30 LAB — COMPREHENSIVE METABOLIC PANEL
ALK PHOS: 50 U/L (ref 40–150)
ALT: 16 U/L (ref 0–55)
ANION GAP: 7 (ref 3–11)
AST: 21 U/L (ref 5–34)
Albumin: 4 g/dL (ref 3.5–5.0)
BUN: 10 mg/dL (ref 7–26)
CALCIUM: 9.7 mg/dL (ref 8.4–10.4)
CO2: 26 mmol/L (ref 22–29)
Chloride: 108 mmol/L (ref 98–109)
Creatinine, Ser: 0.98 mg/dL (ref 0.70–1.30)
GFR calc non Af Amer: >60 mL/min (ref >60)
Glucose, Bld: 77 mg/dL (ref 70–140)
Potassium: 4.3 mmol/L (ref 3.5–5.1)
SODIUM: 141 mmol/L (ref 136–145)
TOTAL PROTEIN: 7.2 g/dL (ref 6.4–8.3)
Total Bilirubin: 0.5 mg/dL (ref 0.2–1.2)

## 2017-08-30 LAB — CBC WITH DIFFERENTIAL/PLATELET
BASOS ABS: 0 10*3/uL (ref 0.0–0.1)
BASOS PCT: 1 %
EOS PCT: 3 %
Eosinophils Absolute: 0.1 10*3/uL (ref 0.0–0.5)
HCT: 40.1 % (ref 38.4–49.9)
Hemoglobin: 13.6 g/dL (ref 13.0–17.1)
Lymphocytes Relative: 24 %
Lymphs Abs: 0.8 10*3/uL — ABNORMAL LOW (ref 0.9–3.3)
MCH: 33.3 pg (ref 27.2–33.4)
MCHC: 33.9 g/dL (ref 32.0–36.0)
MCV: 98.2 fL — ABNORMAL HIGH (ref 79.3–98.0)
MONO ABS: 0.4 10*3/uL (ref 0.1–0.9)
Monocytes Relative: 12 %
NEUTROS ABS: 1.9 10*3/uL (ref 1.5–6.5)
Neutrophils Relative %: 60 %
PLATELETS: 156 10*3/uL (ref 140–400)
RBC: 4.08 MIL/uL — ABNORMAL LOW (ref 4.20–5.82)
RDW: 15.5 % — AB (ref 11.0–14.6)
WBC: 3.2 10*3/uL — ABNORMAL LOW (ref 4.0–10.3)

## 2017-08-30 LAB — TOTAL PROTEIN, URINE DIPSTICK: Protein, ur: NEGATIVE mg/dL

## 2017-08-30 MED ORDER — DEXAMETHASONE SODIUM PHOSPHATE 10 MG/ML IJ SOLN
8.0000 mg | Freq: Once | INTRAMUSCULAR | Status: AC
  Administered 2017-08-30: 8 mg via INTRAVENOUS

## 2017-08-30 MED ORDER — LEUCOVORIN CALCIUM INJECTION 350 MG
400.0000 mg/m2 | Freq: Once | INTRAMUSCULAR | Status: AC
  Administered 2017-08-30: 840 mg via INTRAVENOUS
  Filled 2017-08-30: qty 42

## 2017-08-30 MED ORDER — SODIUM CHLORIDE 0.9 % IV SOLN
2200.0000 mg/m2 | INTRAVENOUS | Status: DC
  Administered 2017-08-30: 4600 mg via INTRAVENOUS
  Filled 2017-08-30: qty 92

## 2017-08-30 MED ORDER — PALONOSETRON HCL INJECTION 0.25 MG/5ML
INTRAVENOUS | Status: AC
  Filled 2017-08-30: qty 5

## 2017-08-30 MED ORDER — DEXAMETHASONE SODIUM PHOSPHATE 10 MG/ML IJ SOLN
INTRAMUSCULAR | Status: AC
Start: 2017-08-30 — End: 2017-08-30
  Filled 2017-08-30: qty 1

## 2017-08-30 MED ORDER — SODIUM CHLORIDE 0.9 % IJ SOLN
10.0000 mL | Freq: Once | INTRAMUSCULAR | Status: AC
  Administered 2017-08-30: 10 mL
  Filled 2017-08-30: qty 10

## 2017-08-30 MED ORDER — BEVACIZUMAB CHEMO INJECTION 400 MG/16ML
5.0000 mg/kg | Freq: Once | INTRAVENOUS | Status: AC
  Administered 2017-08-30: 450 mg via INTRAVENOUS
  Filled 2017-08-30: qty 2

## 2017-08-30 MED ORDER — SODIUM CHLORIDE 0.9 % IV SOLN
Freq: Once | INTRAVENOUS | Status: AC
  Administered 2017-08-30: 14:00:00 via INTRAVENOUS

## 2017-08-30 MED ORDER — DEXAMETHASONE SODIUM PHOSPHATE 10 MG/ML IJ SOLN
INTRAMUSCULAR | Status: AC
  Filled 2017-08-30: qty 1

## 2017-08-30 MED ORDER — PALONOSETRON HCL INJECTION 0.25 MG/5ML
0.2500 mg | Freq: Once | INTRAVENOUS | Status: AC
  Administered 2017-08-30: 0.25 mg via INTRAVENOUS

## 2017-08-30 NOTE — Progress Notes (Signed)
[facility]  [phone] [phone]  Clinic Follow up Note   Patient Care Team: Curlene Labrum, MD as PCP - General (Family Medicine) 08/30/2017  SUMMARY OF ONCOLOGIC HISTORY: Oncology History   Cancer Staging Metastatic colon cancer to liver [facility]) Staging form: Colon and Rectum, AJCC 8th Edition - Clinical stage from 06/01/2016: Stage IVA (cTX, cNX, pM1a) - Signed by Truitt Merle, MD on 07/04/2016 - Pathologic stage from 06/14/2016: Stage IVA (pT4b(m), pN2b, pM1a) - Signed by Truitt Merle, MD on 07/04/2016       Metastatic colon cancer to liver ([place])   04/2015 Procedure    Colonoscopy by Dr. Ladona Horns. It showed showed 2 sessile polyps ready between 3-5 mm in size located 20 cm (A, B) from the point of entry, polypectomy was performed. Pedunculated polyp was found in the ascending colon (C), polypectomy was performed, and additional polyp (D) was found 30 cm from the point of entry, removed      04/2015 Pathology Results    tubular adenoma (A and B), and well differentiated adenocarcinoma arising from tubulovillous adenoma (C) and well differentiated adenocarcinoma arising from severe dysplasia to intramucosal carcinoma within tubular adenoma.       04/2015 Initial Diagnosis    Metastatic colon cancer to liver ([place])      05/29/2016 Imaging    CT abdomen and pelvis with contrast showed an apple core like stricture in right colon just above the cecum, measuring 3.2 cm in lengths. This is highly suspicious for malignancy. Small lymph node a noticed he had adjacent mesentery, measuring 8 mm. There is a low-density lesion within the inferior aspect of the right hepatic lobe measuring 1.7 cm, suspicious for metastasis.       06/06/2016 Tumor Marker    CEA 9.99      06/08/2016 PET scan    IMPRESSION: Approximately 3 cm hypermetabolic mass in the ascending colon, consistent with primary colon carcinoma. This mass results in colonic obstruction and small  bowel dilatation. Additional areas of hypermetabolic wall thickening in the cecum may represent other sites of colon carcinoma or colitis. Mild hypermetabolic lymphadenopathy in right pericolonic region, porta hepatis, and aortocaval space, consistent with metastatic disease. Mild hypermetabolic mediastinal lymphadenopathy also seen, and thoracic lymph node metastases cannot be excluded. Solitary hypermetabolic focus in inferior right hepatic lobe, consistent with liver metastasis. Consider abdomen MRI without and with contrast for further evaluation.      06/14/2016 Surgery    Hand assisted right hemicolectomy and small bowel resection for colon cancer, liver biopsy, by Dr. Barry Dienes      06/14/2016 Pathology Results    Right hemicolectomy showed invasive well to moderately differentiated adenocarcinoma, 2 foci measuring 7.5 cm and 4.5 cm, tumor invades through full thickness of colon, to the seroma and involve the Small Bowel, Surgical Margins Were Negative, 24 Out Of 64 Lymph Nodes Were Positive, Extracapsular Extension Identified, Multiple Satellite Tumor Deposits Present, Liver Biopsy Showed Metastatic Adenocarcinoma.        06/14/2016 Miscellaneous    Tumor MMR normal, MSI stable       06/14/2016 Miscellaneous    Foundation one genomic testing showed K-ras G12 D mutation, APC and TP53 mutation. No BRAF and NRAS mutation. MSI-stable, tumor burden low.      07/06/2016 Tumor Marker    CEA 13.69      07/13/2016 - 01/18/2017 Chemotherapy    mFOLFOX, every 2 weeks, started on 07/14/2015, Avastin added from cycle 3  Oxaliplatin dose to  $'60mg'f$ /m2 due to side effects and some cytopenia on 09/21/16  Changed to FOLFIRINOX starting cycle 7 and Reduced  Due to neuropathy hold Oxaliplatin and add Irinotecan with neulasta on day 3 starting with cycle 7 Add low dose Oxaliplatin with cycle 8.  Due to his worsening neuropathy, and good response to chemotherapy, I previously stopped oxaliplatin from cycle 11,  and continue FOLFIRI and avastin         07/27/2016 Tumor Marker    CEA 15.85      09/04/2016 Imaging    Ct C/A/P W Contrast IMPRESSION: Interval right colectomy. Stable small liver metastasis in the inferior right hepatic lobe. Stable mild porta hepatis and aortocaval lymphadenopathy. Stable mild mediastinal lymphadenopathy. No new or progressive metastatic disease identified within the chest, abdomen, or pelvis.      12/19/2016 PET scan    IMPRESSION: 1. Right hemicolectomy, with resolution of the prior hypermetabolic activity inferiorly in the right hepatic lobe, in several mediastinal lymph nodes, and in lymph nodes in the retroperitoneum and porta hepatis. No residual hypermetabolic or enlarged lymph nodes are identified. 2. Low-grade diffuse skeletal metabolic activity is likely therapy related. 3. Coronary atherosclerosis. 4. 3 by 4 mm right middle lobe pulmonary nodule is stable, not appreciably hypermetabolic, but below sensitive PET-CT size thresholds. This may warrant surveillance.      01/11/2017 Imaging    MR Abdomen W WO Contrast IMPRESSION: 1. No acute findings within the abdomen. Previously noted liver metastasis has resolved in the interval. No new lesions.      02/01/2017 -  Chemotherapy    Maintenance therapy, Xeloda '2000mg'$  (1000 mg/m2)  q12h on day 1-14 every 21 days plus AVASTIN, starting 02/01/2017.  stopped after 12 days due to poor tolerance on 02/14/17  Changed to maintenance 5-FU and avastin every 2 weeks starting on 02/22/17        04/10/2017 Imaging    IMPRESSION: 1. Status post right hemicolectomy without findings for recurrent tumor. 2. No worrisome hepatic lesions. Treated disease with only a small residual low attenuation lesion in the right hepatic lobe. 3. No recurrent mediastinal or abdominal lymphadenopathy.       07/09/2017 PET scan    PET 07/09/17 IMPRESSION: 1. Status post right hemicolectomy, without findings of hypermetabolic  recurrent or metastatic disease. 2. A right middle lobe pulmonary nodule is unchanged. However, there is a right lower lobe 5 mm pulmonary nodule which is felt to be new and enlarged compared to prior exams. Suspicious for an isolated pulmonary metastasis. Consider CT follow-up at 3-6 months. 3. Age advanced coronary artery atherosclerosis. Recommend assessment of coronary risk factors and consideration of medical therapy. 4. Borderline ascending aortic dilatation, 4.0 cm       CURRENT THERAPY:  Maintenance 5-Fu and Avastin started 01/04/2017, he tried one cycle Xeloda to replace 5-fu, but could not tolerate. Added leucovorin and decreased 5-Fu 10% starting on 03/15/17.   INTERVAL HISTORY: Nicholas Lowery returns for follow up as scheduled prior to next cycle avastin/leucovorin/5FU. He is doing well. Continues to have mild stable fatigue but able to carry on activities as normal. He enjoyed a concert last week. Mood is somewhat improved. Good appetite. Has nausea with infrequent vomiting for 5 days after chemo, limiting po intake during that time. No constipation, diarrhea, or hematochezia. Neuropathy is stable; nails are brittle.   REVIEW OF SYSTEMS:   Constitutional: Denies fevers, chills or abnormal weight loss (+) stable fatigue, mild  Eyes: Denies blurriness of vision Ears, nose, mouth,  throat, and face: Denies mucositis or sore throat Respiratory: Denies cough, dyspnea or wheezes Cardiovascular: Denies palpitation, chest discomfort or lower extremity swelling Gastrointestinal:  Denies vomiting, constipation, diarrhea, hematochezia, heartburn or change in bowel habits (+) nausea x5 days after chemo Skin: Denies abnormal skin rashes Lymphatics: Denies new lymphadenopathy or easy bruising Neurological:Denies numbness, tingling or new weaknesses (+) neuropathy, stable  Behavioral/Psych: Mood is stable (+) improved on effexor  All other systems were reviewed with the patient and are  negative.  MEDICAL HISTORY:  Past Medical History:  Diagnosis Date  . Anxiety   . Cancer of ascending colon ([place])   . Seasonal allergies     SURGICAL HISTORY: Past Surgical History:  Procedure Laterality Date  . APPENDECTOMY  1992  . CATARACT EXTRACTION W/ INTRAOCULAR LENS IMPLANT Left 04/2016  . COLON SURGERY    . COLONOSCOPY W/ BIOPSIES AND POLYPECTOMY  04/2015  . INGUINAL HERNIA REPAIR Right 1982  . IR RADIOLOGIST EVAL & MGMT  01/16/2017  . KNEE ARTHROSCOPY W/ MENISCECTOMY Right 1998  . LAPAROSCOPIC RIGHT COLECTOMY N/A 06/14/2016   Procedure: LAPAROSCOPIC HAND ASSISTED HEMICOLECTOMY AND SMALL BOWEL RESECTION.;  Surgeon: [name], MD;  Location: [facility];  Service: General;  Laterality: N/A;  . LIVER BIOPSY Right 06/14/2016   Procedure: LIVER BIOPSY;  Surgeon: [name], MD;  Location: [facility];  Service: General;  Laterality: Right;  Right Inferior Liver  . PORTACATH PLACEMENT N/A 07/06/2016   Procedure: INSERTION PORT-A-CATH;  Surgeon: [name], MD;  Location: [facility];  Service: General;  Laterality: N/A;  . VASECTOMY  2005    I have reviewed the social history and family history with the patient and they are unchanged from previous note.  ALLERGIES:  is allergic to penicillins and xeloda [capecitabine].  MEDICATIONS:  Current Outpatient Medications  Medication Sig Dispense Refill  . amLODipine (NORVASC) 10 MG tablet Take 1 tablet (10 mg total) by mouth daily. 30 tablet 2  . celecoxib (CELEBREX) 200 MG capsule Take 200 mg by mouth daily.  1  . lidocaine-prilocaine (EMLA) cream Apply to affected area once 30 g 3  . lisinopril (PRINIVIL) 10 MG tablet Take 1 tablet (10 mg total) by mouth daily. 90 tablet 2  . LORazepam (ATIVAN) 0.5 MG tablet Take 1 tablet (0.5 mg total) by mouth every 8 (eight) hours as needed (for nausea). 30 tablet 0  . Multiple Vitamin (MULTIVITAMIN WITH MINERALS) TABS tablet Take 1 tablet by mouth daily.    . ondansetron (ZOFRAN ODT) 8  MG disintegrating tablet Take 1 tablet (8 mg total) by mouth every 8 (eight) hours as needed for nausea or vomiting. 30 tablet 2  . prochlorperazine (COMPAZINE) 10 MG tablet Take 1 tablet (10 mg total) by mouth every 6 (six) hours as needed for nausea or vomiting. 30 tablet 3  . traMADol (ULTRAM) 50 MG tablet Take by mouth 2 (two) times daily.    . traMADol (ULTRAM) 50 MG tablet Take 1 tablet (50 mg total) by mouth every 6 (six) hours as needed. 30 tablet 0  . venlafaxine XR (EFFEXOR-XR) 75 MG 24 hr capsule Take 1 capsule (75 mg total) by mouth daily with breakfast. 30 capsule 1  . dexamethasone (DECADRON) 4 MG tablet For 5 days after chemo (begin day 2) (Patient not taking: Reported on 08/09/2017) 10 tablet 0  . diphenoxylate-atropine (LOMOTIL) 2.5-0.025 MG tablet Take 2 tablets by mouth 4 (four) times daily as needed for diarrhea or loose stools. (Patient not taking: Reported  on 08/09/2017) 30 tablet 0  . urea (CARMOL) 10 % cream Apply topically 3 (three) times daily. (Patient not taking: Reported on 07/24/2017) 71 g 1   No current facility-administered medications for this visit.    Facility-Administered Medications Ordered in Other Visits  Medication Dose Route Frequency Provider Last Rate Last Dose  . clindamycin (CLEOCIN) 900 mg in dextrose 5 % 50 mL IVPB  900 mg Intravenous 60 min Pre-Op [name], MD       And  . gentamicin (GARAMYCIN) 450 mg in dextrose 5 % 50 mL IVPB  5 mg/kg Intravenous 60 min Pre-Op [name], MD      . dexamethasone (DECADRON) injection 10 mg  10 mg Intravenous Once Harle Stanford., PA-C      . diphenoxylate-atropine (LOMOTIL) 2.5-0.025 MG per tablet 2 tablet  2 tablet Oral Once Harle Stanford., PA-C      . fluorouracil (ADRUCIL) 4,600 mg in sodium chloride 0.9 % 58 mL chemo infusion  2,200 mg/m2 (Treatment Plan Recorded) Intravenous 1 day or 1 dose Truitt Merle, MD   4,600 mg at 08/30/17 1514  . heparin lock flush 100 unit/mL  500 Units Intracatheter Once PRN Truitt Merle, MD      . sodium chloride flush (NS) 0.9 % injection 10 mL  10 mL Intracatheter PRN Truitt Merle, MD        PHYSICAL EXAMINATION: ECOG PERFORMANCE STATUS: 1 - Symptomatic but completely ambulatory  Vitals:   08/30/17 1249  BP: 114/85  Pulse: 66  Resp: 18  Temp: 98 F (36.7 C)  SpO2: 100%   Filed Weights   08/30/17 1249  Weight: 205 lb 9.6 oz (93.3 kg)    GENERAL:alert, no distress and comfortable SKIN: skin color, texture, turgor are normal, no rashes or significant lesions EYES: normal, Conjunctiva are pink and non-injected, sclera clear OROPHARYNX:no exudate, no erythema and lips, buccal mucosa, and tongue normal  LYMPH:  no palpable cervical, supraclavicular, axillary lymphadenopathy LUNGS: clear to auscultation with normal breathing effort HEART: regular rate & rhythm and no murmurs and no lower extremity edema ABDOMEN:abdomen soft, non-tender and normal bowel sounds Musculoskeletal:no cyanosis of digits and no clubbing  NEURO: alert & oriented x 3 with fluent speech, no focal motor/sensory deficits PAC without erythema   LABORATORY DATA:  I have reviewed the data as listed CBC Latest Ref Rng & Units 08/30/2017 08/09/2017 07/24/2017  WBC 4.0 - 10.3 K/uL 3.2(L) 4.7 7.5  Hemoglobin 13.0 - 17.1 g/dL 13.6 - -  Hematocrit 38.4 - 49.9 % 40.1 38.4 45.4  Platelets 140 - 400 K/uL 156 143 176     CMP Latest Ref Rng & Units 08/30/2017 08/09/2017 07/24/2017  Glucose 70 - 140 mg/dL 77 131 133  BUN 7 - 26 mg/dL '10 17 25  '$ Creatinine 0.70 - 1.30 mg/dL 0.98 1.05 1.79(H)  Sodium 136 - 145 mmol/L 141 139 138  Potassium 3.5 - 5.1 mmol/L 4.3 4.2 4.5  Chloride 98 - 109 mmol/L 108 106 110(H)  CO2 22 - 29 mmol/L 26 24 15(L)  Calcium 8.4 - 10.4 mg/dL 9.7 9.4 9.8  Total Protein 6.4 - 8.3 g/dL 7.2 6.7 7.4  Total Bilirubin 0.2 - 1.2 mg/dL 0.5 0.7 1.1  Alkaline Phos 40 - 150 U/L 50 48 42  AST 5 - 34 U/L '21 17 18  '$ ALT 0 - 55 U/L '16 18 18   '$ PATHOLOGY   Diagnosis 06/14/2016 1. Colon,  segmental resection for tumor, Right Ascending Hemicolectomy and Small  Bowel - INVASIVE WELL TO MODERATELY DIFFERENTIATED ADENOCARCINOMA. - TWO TUMOR FOCI MEASURING 7.5 CM AND 4.5 CM IN GREATEST DIMENSION. - TUMOR INVADES THROUGH FULL THICKNESS OF COLON, THROUGH THE SEROSA TO INVOLVE THE SMALL BOWEL. - MARGINS ARE NEGATIVE. - TWENTY FOUR OF SIXTY FOUR LYMPH NODES POSITIVE FOR METASTATIC ADENOCARCINOMA (24/64). - EXTRACAPSULAR EXTENSION IDENTIFIED - MULTIPLE SATELLITE TUMOR DEPOSITS PRESENT. - SEE ONCOLOGY TEMPLATE. 2. Liver, needle/core biopsy, Right Inferior - POSITIVE FOR METASTATIC ADENOCARCINOMA.       RADIOGRAPHIC STUDIES: I have personally reviewed the radiological images as listed and agreed with the findings in the report. No results found.   ASSESSMENT & PLAN: 57 y.o. Caucasian male, without significant past medical history, presented with rectal bleeding in December 2016, colonoscopy showed 4 polyps, 2 of them showed invasive adenocarcinoma, unfortunately he was not aware of the pathology findings and was not treated. He now presented with fatigue, weight loss, anorexia and abdominal pain  1. Right colon cancer with liver and node metastasis, [id number], stage IV, MSI-stable, KRAS G12D mutation (+) -Mr. Gaskill appears stable. He completed another cycle of avastin/leucovorin/5FU on 08/09/17. Current cycle was delayed per patient's request. He is tolerating treatment moderately well with stable fatigue and neuropathy. He has moderate nausea x5 days after chemo, po intake is limited. I suggest adding emend back to his regimen, will see if his insurance will approve and hopefully add with next cycle. He is otherwise doing well. Labs reviewed, adequate for treatment. Proceed with next cycle, continue q2 weeks. F/u in 4 weeks. Will obtain restaging CT in 1 month.   2. Depression, anxiety -somewhat improved on effexor-xr  3. Chemotherapy-induced nausea  -He has moderate nausea  for approx 5 days after chemo, gets aloxi and decadron pre-meds. Has required occasional symptom management visits for n/v. I suggest to add emend back to his regimen. Will seek insurance approval and hope to add from next cycle.   4. Hypertension -BP well controlled lately. Urine protein negative  5. Anemia of iron deficiency, and chemo  Hgb stable lately; iron studies adequate for now but trending down; will follow closely.   6. Anorexia and weight loss secondary to chemo -weight is stable lately  7. Peripheral neuropathy, G1 -Secondary to chemotherapy -Stable without peripheral sensory deficit; he notes improvement with extra week off chemo. Will monitor closely.   8. Goal of care discussion- full code   PLAN: -labs reviewed, proceed with next cycle avastin/5FU/leucovorin -return in 2 weeks for next cycle, f/u in 4 weeks -restaging CT end of 09/2017  -I signed work form verifying he received recent physical exam and blood work   All questions were answered. The patient knows to call the clinic with any problems, questions or concerns. No barriers to learning was detected.     Alla Feeling, NP 08/30/17

## 2017-08-31 ENCOUNTER — Telehealth: Payer: Self-pay | Admitting: Hematology

## 2017-08-31 NOTE — Telephone Encounter (Signed)
Appointments scheduled per 4/18 los

## 2017-09-01 ENCOUNTER — Inpatient Hospital Stay: Payer: BLUE CROSS/BLUE SHIELD

## 2017-09-01 VITALS — BP 133/79 | HR 65 | Temp 98.7°F | Resp 18

## 2017-09-01 DIAGNOSIS — C189 Malignant neoplasm of colon, unspecified: Secondary | ICD-10-CM

## 2017-09-01 DIAGNOSIS — C182 Malignant neoplasm of ascending colon: Secondary | ICD-10-CM | POA: Diagnosis not present

## 2017-09-01 DIAGNOSIS — C787 Secondary malignant neoplasm of liver and intrahepatic bile duct: Principal | ICD-10-CM

## 2017-09-01 MED ORDER — HEPARIN SOD (PORK) LOCK FLUSH 100 UNIT/ML IV SOLN
500.0000 [IU] | Freq: Once | INTRAVENOUS | Status: AC | PRN
  Administered 2017-09-01: 500 [IU]
  Filled 2017-09-01: qty 5

## 2017-09-01 MED ORDER — SODIUM CHLORIDE 0.9% FLUSH
10.0000 mL | INTRAVENOUS | Status: DC | PRN
  Administered 2017-09-01: 10 mL
  Filled 2017-09-01: qty 10

## 2017-09-04 ENCOUNTER — Other Ambulatory Visit: Payer: Self-pay | Admitting: Hematology

## 2017-09-13 ENCOUNTER — Inpatient Hospital Stay: Payer: BLUE CROSS/BLUE SHIELD

## 2017-09-13 ENCOUNTER — Inpatient Hospital Stay: Payer: BLUE CROSS/BLUE SHIELD | Attending: Hematology

## 2017-09-13 VITALS — BP 119/81 | HR 54 | Temp 98.0°F | Resp 18 | Wt 203.0 lb

## 2017-09-13 DIAGNOSIS — I10 Essential (primary) hypertension: Secondary | ICD-10-CM | POA: Insufficient documentation

## 2017-09-13 DIAGNOSIS — R634 Abnormal weight loss: Secondary | ICD-10-CM | POA: Diagnosis not present

## 2017-09-13 DIAGNOSIS — C7800 Secondary malignant neoplasm of unspecified lung: Secondary | ICD-10-CM | POA: Diagnosis not present

## 2017-09-13 DIAGNOSIS — Z87891 Personal history of nicotine dependence: Secondary | ICD-10-CM | POA: Diagnosis not present

## 2017-09-13 DIAGNOSIS — E860 Dehydration: Secondary | ICD-10-CM | POA: Insufficient documentation

## 2017-09-13 DIAGNOSIS — R911 Solitary pulmonary nodule: Secondary | ICD-10-CM | POA: Diagnosis not present

## 2017-09-13 DIAGNOSIS — H269 Unspecified cataract: Secondary | ICD-10-CM | POA: Diagnosis not present

## 2017-09-13 DIAGNOSIS — C771 Secondary and unspecified malignant neoplasm of intrathoracic lymph nodes: Secondary | ICD-10-CM | POA: Insufficient documentation

## 2017-09-13 DIAGNOSIS — F419 Anxiety disorder, unspecified: Secondary | ICD-10-CM | POA: Insufficient documentation

## 2017-09-13 DIAGNOSIS — R45851 Suicidal ideations: Secondary | ICD-10-CM | POA: Diagnosis not present

## 2017-09-13 DIAGNOSIS — Z95828 Presence of other vascular implants and grafts: Secondary | ICD-10-CM

## 2017-09-13 DIAGNOSIS — D500 Iron deficiency anemia secondary to blood loss (chronic): Secondary | ICD-10-CM | POA: Diagnosis not present

## 2017-09-13 DIAGNOSIS — D696 Thrombocytopenia, unspecified: Secondary | ICD-10-CM | POA: Diagnosis not present

## 2017-09-13 DIAGNOSIS — Z79899 Other long term (current) drug therapy: Secondary | ICD-10-CM | POA: Insufficient documentation

## 2017-09-13 DIAGNOSIS — R630 Anorexia: Secondary | ICD-10-CM | POA: Diagnosis not present

## 2017-09-13 DIAGNOSIS — C189 Malignant neoplasm of colon, unspecified: Secondary | ICD-10-CM

## 2017-09-13 DIAGNOSIS — C182 Malignant neoplasm of ascending colon: Secondary | ICD-10-CM | POA: Diagnosis present

## 2017-09-13 DIAGNOSIS — Z5111 Encounter for antineoplastic chemotherapy: Secondary | ICD-10-CM | POA: Insufficient documentation

## 2017-09-13 DIAGNOSIS — C787 Secondary malignant neoplasm of liver and intrahepatic bile duct: Principal | ICD-10-CM

## 2017-09-13 DIAGNOSIS — Z9049 Acquired absence of other specified parts of digestive tract: Secondary | ICD-10-CM | POA: Diagnosis not present

## 2017-09-13 DIAGNOSIS — G4700 Insomnia, unspecified: Secondary | ICD-10-CM | POA: Diagnosis not present

## 2017-09-13 DIAGNOSIS — G629 Polyneuropathy, unspecified: Secondary | ICD-10-CM | POA: Diagnosis not present

## 2017-09-13 DIAGNOSIS — R590 Localized enlarged lymph nodes: Secondary | ICD-10-CM | POA: Diagnosis not present

## 2017-09-13 LAB — COMPREHENSIVE METABOLIC PANEL
ALBUMIN: 4.2 g/dL (ref 3.5–5.0)
ALT: 16 U/L (ref 0–55)
ANION GAP: 7 (ref 3–11)
AST: 20 U/L (ref 5–34)
Alkaline Phosphatase: 49 U/L (ref 40–150)
BUN: 20 mg/dL (ref 7–26)
CHLORIDE: 106 mmol/L (ref 98–109)
CO2: 24 mmol/L (ref 22–29)
Calcium: 9.4 mg/dL (ref 8.4–10.4)
Creatinine, Ser: 1.17 mg/dL (ref 0.70–1.30)
GFR calc non Af Amer: >60 mL/min (ref >60)
GLUCOSE: 86 mg/dL (ref 70–140)
POTASSIUM: 4.7 mmol/L (ref 3.5–5.1)
SODIUM: 137 mmol/L (ref 136–145)
Total Bilirubin: 0.5 mg/dL (ref 0.2–1.2)
Total Protein: 7 g/dL (ref 6.4–8.3)

## 2017-09-13 LAB — IRON AND TIBC
Iron: 101 ug/dL (ref 42–163)
SATURATION RATIOS: 29 % — AB (ref 42–163)
TIBC: 351 ug/dL (ref 202–409)
UIBC: 250 ug/dL

## 2017-09-13 LAB — CEA (IN HOUSE-CHCC): CEA (CHCC-IN HOUSE): 4.59 ng/mL (ref 0.00–5.00)

## 2017-09-13 LAB — CBC WITH DIFFERENTIAL/PLATELET
Basophils Absolute: 0 10*3/uL (ref 0.0–0.1)
Basophils Relative: 1 %
Eosinophils Absolute: 0.1 10*3/uL (ref 0.0–0.5)
Eosinophils Relative: 3 %
HEMATOCRIT: 38.2 % — AB (ref 38.4–49.9)
HEMOGLOBIN: 13 g/dL (ref 13.0–17.1)
LYMPHS ABS: 0.9 10*3/uL (ref 0.9–3.3)
LYMPHS PCT: 21 %
MCH: 33.1 pg (ref 27.2–33.4)
MCHC: 34.1 g/dL (ref 32.0–36.0)
MCV: 97.2 fL (ref 79.3–98.0)
MONO ABS: 0.5 10*3/uL (ref 0.1–0.9)
MONOS PCT: 11 %
NEUTROS ABS: 2.7 10*3/uL (ref 1.5–6.5)
NEUTROS PCT: 64 %
Platelets: 139 10*3/uL — ABNORMAL LOW (ref 140–400)
RBC: 3.93 MIL/uL — ABNORMAL LOW (ref 4.20–5.82)
RDW: 15 % — AB (ref 11.0–14.6)
WBC: 4.3 10*3/uL (ref 4.0–10.3)

## 2017-09-13 LAB — FERRITIN: FERRITIN: 63 ng/mL (ref 22–316)

## 2017-09-13 MED ORDER — SODIUM CHLORIDE 0.9 % IV SOLN
2200.0000 mg/m2 | INTRAVENOUS | Status: DC
  Administered 2017-09-13: 4600 mg via INTRAVENOUS
  Filled 2017-09-13: qty 92

## 2017-09-13 MED ORDER — SODIUM CHLORIDE 0.9% FLUSH
10.0000 mL | Freq: Once | INTRAVENOUS | Status: AC
  Administered 2017-09-13: 10 mL
  Filled 2017-09-13: qty 10

## 2017-09-13 MED ORDER — SODIUM CHLORIDE 0.9 % IV SOLN
Freq: Once | INTRAVENOUS | Status: AC
  Administered 2017-09-13: 13:00:00 via INTRAVENOUS

## 2017-09-13 MED ORDER — PALONOSETRON HCL INJECTION 0.25 MG/5ML
INTRAVENOUS | Status: AC
  Filled 2017-09-13: qty 5

## 2017-09-13 MED ORDER — LEUCOVORIN CALCIUM INJECTION 350 MG
400.0000 mg/m2 | Freq: Once | INTRAMUSCULAR | Status: AC
  Administered 2017-09-13: 840 mg via INTRAVENOUS
  Filled 2017-09-13: qty 42

## 2017-09-13 MED ORDER — PALONOSETRON HCL INJECTION 0.25 MG/5ML
0.2500 mg | Freq: Once | INTRAVENOUS | Status: AC
  Administered 2017-09-13: 0.25 mg via INTRAVENOUS

## 2017-09-13 MED ORDER — SODIUM CHLORIDE 0.9 % IV SOLN
Freq: Once | INTRAVENOUS | Status: AC
  Administered 2017-09-13: 13:00:00 via INTRAVENOUS
  Filled 2017-09-13: qty 5

## 2017-09-13 MED ORDER — ALTEPLASE 2 MG IJ SOLR
2.0000 mg | Freq: Once | INTRAMUSCULAR | Status: DC
  Filled 2017-09-13: qty 2

## 2017-09-13 MED ORDER — BEVACIZUMAB CHEMO INJECTION 400 MG/16ML
5.0000 mg/kg | Freq: Once | INTRAVENOUS | Status: AC
  Administered 2017-09-13: 450 mg via INTRAVENOUS
  Filled 2017-09-13: qty 16

## 2017-09-13 NOTE — Patient Instructions (Signed)
[facility] Discharge Instructions for Patients Receiving Chemotherapy  Today you received the following chemotherapy agents Avastin, Leucovorin and Adrucil  To help prevent nausea and vomiting after your treatment, we encourage you to take your nausea medication as directed. No Zofran for 3 days. Take Compazine instead.    If you develop nausea and vomiting that is not controlled by your nausea medication, call the clinic.   BELOW ARE SYMPTOMS THAT SHOULD BE REPORTED IMMEDIATELY:  *FEVER GREATER THAN 100.5 F  *CHILLS WITH OR WITHOUT FEVER  NAUSEA AND VOMITING THAT IS NOT CONTROLLED WITH YOUR NAUSEA MEDICATION  *UNUSUAL SHORTNESS OF BREATH  *UNUSUAL BRUISING OR BLEEDING  TENDERNESS IN MOUTH AND THROAT WITH OR WITHOUT PRESENCE OF ULCERS  *URINARY PROBLEMS  *BOWEL PROBLEMS  UNUSUAL RASH Items with * indicate a potential emergency and should be followed up as soon as possible.  Feel free to call the clinic should you have any questions or concerns. The clinic phone number is [phone].  Please show the [facility] at check-in to the Emergency Department and triage nurse.

## 2017-09-15 ENCOUNTER — Inpatient Hospital Stay: Payer: BLUE CROSS/BLUE SHIELD

## 2017-09-15 VITALS — BP 111/83 | HR 78 | Temp 98.4°F | Resp 18

## 2017-09-15 DIAGNOSIS — C787 Secondary malignant neoplasm of liver and intrahepatic bile duct: Principal | ICD-10-CM

## 2017-09-15 DIAGNOSIS — C189 Malignant neoplasm of colon, unspecified: Secondary | ICD-10-CM

## 2017-09-15 DIAGNOSIS — C182 Malignant neoplasm of ascending colon: Secondary | ICD-10-CM | POA: Diagnosis not present

## 2017-09-15 MED ORDER — SODIUM CHLORIDE 0.9% FLUSH
10.0000 mL | INTRAVENOUS | Status: DC | PRN
  Administered 2017-09-15: 10 mL
  Filled 2017-09-15: qty 10

## 2017-09-15 MED ORDER — HEPARIN SOD (PORK) LOCK FLUSH 100 UNIT/ML IV SOLN
500.0000 [IU] | Freq: Once | INTRAVENOUS | Status: AC | PRN
  Administered 2017-09-15: 500 [IU]
  Filled 2017-09-15: qty 5

## 2017-09-15 NOTE — Patient Instructions (Signed)
Implanted Port Home Guide An implanted port is a type of central line that is placed under the skin. Central lines are used to provide IV access when treatment or nutrition needs to be given through a person's veins. Implanted ports are used for long-term IV access. An implanted port may be placed because:  You need IV medicine that would be irritating to the small veins in your hands or arms.  You need long-term IV medicines, such as antibiotics.  You need IV nutrition for a long period.  You need frequent blood draws for lab tests.  You need dialysis.  Implanted ports are usually placed in the chest area, but they can also be placed in the upper arm, the abdomen, or the leg. An implanted port has two main parts:  Reservoir. The reservoir is round and will appear as a small, raised area under your skin. The reservoir is the part where a needle is inserted to give medicines or draw blood.  Catheter. The catheter is a thin, flexible tube that extends from the reservoir. The catheter is placed into a large vein. Medicine that is inserted into the reservoir goes into the catheter and then into the vein.  How will I care for my incision site? Do not get the incision site wet. Bathe or shower as directed by your health care provider. How is my port accessed? Special steps must be taken to access the port:  Before the port is accessed, a numbing cream can be placed on the skin. This helps numb the skin over the port site.  Your health care provider uses a sterile technique to access the port. ? Your health care provider must put on a mask and sterile gloves. ? The skin over your port is cleaned carefully with an antiseptic and allowed to dry. ? The port is gently pinched between sterile gloves, and a needle is inserted into the port.  Only "non-coring" port needles should be used to access the port. Once the port is accessed, a blood return should be checked. This helps ensure that the port  is in the vein and is not clogged.  If your port needs to remain accessed for a constant infusion, a clear (transparent) bandage will be placed over the needle site. The bandage and needle will need to be changed every week, or as directed by your health care provider.  Keep the bandage covering the needle clean and dry. Do not get it wet. Follow your health care provider's instructions on how to take a shower or bath while the port is accessed.  If your port does not need to stay accessed, no bandage is needed over the port.  What is flushing? Flushing helps keep the port from getting clogged. Follow your health care provider's instructions on how and when to flush the port. Ports are usually flushed with saline solution or a medicine called heparin. The need for flushing will depend on how the port is used.  If the port is used for intermittent medicines or blood draws, the port will need to be flushed: ? After medicines have been given. ? After blood has been drawn. ? As part of routine maintenance.  If a constant infusion is running, the port may not need to be flushed.  How long will my port stay implanted? The port can stay in for as long as your health care provider thinks it is needed. When it is time for the port to come out, surgery will be   done to remove it. The procedure is similar to the one performed when the port was put in. When should I seek immediate medical care? When you have an implanted port, you should seek immediate medical care if:  You notice a bad smell coming from the incision site.  You have swelling, redness, or drainage at the incision site.  You have more swelling or pain at the port site or the surrounding area.  You have a fever that is not controlled with medicine.  This information is not intended to replace advice given to you by your health care provider. Make sure you discuss any questions you have with your health care provider. Document  Released: 05/01/2005 Document Revised: 10/07/2015 Document Reviewed: 01/06/2013 Elsevier Interactive Patient Education  2017 [employer]  

## 2017-09-19 ENCOUNTER — Other Ambulatory Visit: Payer: Self-pay | Admitting: Hematology

## 2017-09-19 DIAGNOSIS — R030 Elevated blood-pressure reading, without diagnosis of hypertension: Secondary | ICD-10-CM

## 2017-09-26 NOTE — Progress Notes (Signed)
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 Cathey. It showed showed 2 sessile polyps ready between 3-5 mm in size located 20 cm (A, B) from the point of entry, polypectomy was performed. Pedunculated polyp was found in the ascending colon (C), polypectomy was performed, and additional polyp (D) was found 30 cm from the point of entry, removed      04/2015 Pathology Results    tubular adenoma (A and B), and well differentiated adenocarcinoma arising from tubulovillous adenoma (C) and well differentiated adenocarcinoma arising from severe dysplasia to intramucosal carcinoma within tubular adenoma.       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 This is highly suspicious for malignancy. Small lymph node a noticed he had adjacent mesentery, measuring 8 mm. There is a low-density lesion within the inferior aspect of the right hepatic lobe measuring 1.7 cm, suspicious for metastasis.       06/06/2016 Tumor Marker    CEA 9.99      06/08/2016 PET scan    IMPRESSION: Approximately 3 cm hypermetabolic mass in the ascending colon, consistent with primary colon carcinoma.  This mass results in colonic obstruction and small bowel dilatation. Additional areas of hypermetabolic wall thickening in the cecum may represent other sites of colon carcinoma or colitis. Mild hypermetabolic lymphadenopathy in right pericolonic region, porta hepatis, and aortocaval space, consistent with metastatic disease. Mild hypermetabolic mediastinal lymphadenopathy also seen, and thoracic lymph node metastases cannot be excluded. Solitary hypermetabolic focus in inferior right hepatic lobe, consistent with liver metastasis. Consider abdomen MRI without and with contrast for further evaluation.      06/14/2016 Surgery    Hand assisted right hemicolectomy and small bowel resection for colon cancer, liver biopsy, by Dr. Byerly      06/14/2016 Pathology Results    Right hemicolectomy showed invasive well to moderately differentiated adenocarcinoma, 2 foci measuring 7.5 cm and 4.5 cm, tumor invades through full thickness of colon, to the seroma and involve the Small Bowel, Surgical Margins Were Negative, 24 Out Of 64 Lymph Nodes Were Positive, Extracapsular Extension Identified, Multiple Satellite Tumor Deposits Present, Liver Biopsy Showed Metastatic Adenocarcinoma.        06/14/2016 Miscellaneous    Tumor MMR normal, MSI stable       06/14/2016 Miscellaneous    Foundation one genomic testing showed K-ras G12 D mutation, APC and TP53 mutation. No BRAF and NRAS mutation. MSI-stable, tumor burden low.      07/06/2016 Tumor Marker    CEA 13.69      07/13/2016 - 01/18/2017 Chemotherapy    mFOLFOX, every 2   weeks, started on 07/14/2015, Avastin added from cycle 3  Oxaliplatin dose to 66m/m2 due to side effects and some cytopenia on 09/21/16  Changed to FOLFIRINOX starting cycle 7 and Reduced  Due to neuropathy hold Oxaliplatin and add Irinotecan with neulasta on day 3 starting with cycle 7 Add low dose Oxaliplatin with cycle 8.  Due to his worsening neuropathy, and good response to  chemotherapy, I previously stopped oxaliplatin from cycle 11, and continue FOLFIRI and avastin         07/27/2016 Tumor Marker    CEA 15.85      09/04/2016 Imaging    Ct C/A/P W Contrast IMPRESSION: Interval right colectomy. Stable small liver metastasis in the inferior right hepatic lobe. Stable mild porta hepatis and aortocaval lymphadenopathy. Stable mild mediastinal lymphadenopathy. No new or progressive metastatic disease identified within the chest, abdomen, or pelvis.      12/19/2016 PET scan    IMPRESSION: 1. Right hemicolectomy, with resolution of the prior hypermetabolic activity inferiorly in the right hepatic lobe, in several mediastinal lymph nodes, and in lymph nodes in the retroperitoneum and porta hepatis. No residual hypermetabolic or enlarged lymph nodes are identified. 2. Low-grade diffuse skeletal metabolic activity is likely therapy related. 3. Coronary atherosclerosis. 4. 3 by 4 mm right middle lobe pulmonary nodule is stable, not appreciably hypermetabolic, but below sensitive PET-CT size thresholds. This may warrant surveillance.      01/11/2017 Imaging    MR Abdomen W WO Contrast IMPRESSION: 1. No acute findings within the abdomen. Previously noted liver metastasis has resolved in the interval. No new lesions.      02/01/2017 -  Chemotherapy    Maintenance therapy, Xeloda 20026m(1000 mg/m2)  q12h on day 1-14 every 21 days plus AVASTIN, starting 02/01/2017.  stopped after 12 days due to poor tolerance on 02/14/17  Changed to maintenance 5-FU and avastin every 2 weeks starting on 02/22/17        04/10/2017 Imaging    IMPRESSION: 1. Status post right hemicolectomy without findings for recurrent tumor. 2. No worrisome hepatic lesions. Treated disease with only a small residual low attenuation lesion in the right hepatic lobe. 3. No recurrent mediastinal or abdominal lymphadenopathy.       07/09/2017 PET scan    PET 07/09/17 IMPRESSION: 1. Status  post right hemicolectomy, without findings of hypermetabolic recurrent or metastatic disease. 2. A right middle lobe pulmonary nodule is unchanged. However, there is a right lower lobe 5 mm pulmonary nodule which is felt to be new and enlarged compared to prior exams. Suspicious for an isolated pulmonary metastasis. Consider CT follow-up at 3-6 months. 3. Age advanced coronary artery atherosclerosis. Recommend assessment of coronary risk factors and consideration of medical therapy. 4. Borderline ascending aortic dilatation, 4.0 cm         HISTORY OF PRESENTING ILLNESS (06/01/2016):  Nicholas Bode89.o. male is here because of his recently abdominal CT which is highly suspicious for metastatic colon cancer. He is accompanied by his wife to my clinic today. He was referred by his primary care physician Dr. BuPleas Koch  He had colonoscopy in 2016 for mild rectal bleeding, he describe small amount fresh blood mixed with stool, he has no other constitutional symptoms at that time. He was referred to gastroenterologist Dr. CaLadona Hornsnd underwent a colonoscopy in December 2016. The colonoscopy showed 2 sessile polyps ready between 3-5 mm in size located 20 cm (A, B) from the point of entry, polypectomy was performed. Pedunculated polyp  was found in the ascending colon (C), polypectomy was performed, and additional polyp (D) was found 30 cm from the point of entry, removed. The pathology reviewed tubular adenoma (A and B), and well differentiated adenocarcinoma arising from tubulovillous adenoma (C) and well differentiated adenocarcinoma arising from severe dysplasia to intramucosal carcinoma within tubular adenoma. Dr. Tye [place] tried multiple times to reach patient, but patient sought they were calling him about the bill, and did not return the phone calls. He was not aware the cancer diagnosis until recently.   He started having diarrhea and vomiting in mid Dec 2017, and felt a "pop" in right side  abdomen, he was seen at urgent care, and was treated with antiemetics, and IVF, lab tests were OK. Due to his persistent intermittent diarrhea and epigastric pain since then, he was seen by PCP and he eventually had CT abdomen and pelvis scan which showed a upper core lesion in the ascending colon, and I'll 1.7 cm lesion in the liver, highly suspicious for metastasis. He was referred to [place] for further evaluation.  He has lost 30 lbs in the past one month, has low appetite, eats a small meals 1-2 times a day. He has moderate fatigue, able to tolerate routine activities including his work, but feels exhausted at the end of study. He has occasional constipation, denies recent rectal bleeding.  CURRENT THERAPY:  Maintenance 5-Fu and Avastin started 01/04/2017, he tried one cycle Xeloda to replace 5-fu, but could not tolerate. Added leucovorin and decreased 5-Fu 10% starting on 03/15/17.  INTERIM HISTORY:  Nicholas Lowery returns today for follow up and he presents by himself. He is doing well overall. He notes that family has been well overall and although he notes additional added family stress. He was given IV zofran on his last treatment, however he noted that he still had nausea following his recent treatment. He is taking compazine following his treatments intermittently as needed. He doesn't take dexamethasone following treatments. He denies having PO steroids at home.   Labs today, 09/27/2017 show CBC with RBC at 4.11, MCV at 98.5, RDW at 14.9, PLT at 119K, and 0.7. CMP PENDING.   On review of systems, he denies lack of appetite and any other symptoms. Pertinent positives are listed and detailed within the above HPI.    MEDICAL HISTORY:  Past Medical History:  Diagnosis Date  . Anxiety   . Cancer of ascending colon ([place])   . Seasonal allergies     SURGICAL HISTORY: Past Surgical History:  Procedure Laterality Date  . APPENDECTOMY  1992  . CATARACT EXTRACTION W/ INTRAOCULAR LENS IMPLANT Left 04/2016    . COLON SURGERY    . COLONOSCOPY W/ BIOPSIES AND POLYPECTOMY  04/2015  . INGUINAL HERNIA REPAIR Right 1982  . IR RADIOLOGIST EVAL & MGMT  01/16/2017  . KNEE ARTHROSCOPY W/ MENISCECTOMY Right 1998  . LAPAROSCOPIC RIGHT COLECTOMY N/A 06/14/2016   Procedure: LAPAROSCOPIC HAND ASSISTED HEMICOLECTOMY AND SMALL BOWEL RESECTION.;  Surgeon: [name], MD;  Location: [facility];  Service: General;  Laterality: N/A;  . LIVER BIOPSY Right 06/14/2016   Procedure: LIVER BIOPSY;  Surgeon: [name], MD;  Location: [place] [facility];  Service: General;  Laterality: Right;  Right Inferior Liver  . PORTACATH PLACEMENT N/A 07/06/2016   Procedure: INSERTION PORT-A-CATH;  Surgeon: [name], MD;  Location: [facility];  Service: General;  Laterality: N/A;  . VASECTOMY  2005    SOCIAL HISTORY: Social History   Socioeconomic History  . Marital status:  Married    Spouse name: Not on file  . Number of children: Not on file  . Years of education: Not on file  . Highest education level: Not on file  Occupational History  . Not on file  Social Needs  . Financial resource strain: Not on file  . Food insecurity:    Worry: Not on file    Inability: Not on file  . Transportation needs:    Medical: Not on file    Non-medical: Not on file  Tobacco Use  . Smoking status: Former Smoker    Years: 2.00    Types: Cigarettes    Last attempt to quit: 1990    Years since quitting: 29.3  . Smokeless tobacco: Never Used  Substance and Sexual Activity  . Alcohol use: Yes    Alcohol/week: 2.4 oz    Types: 4 Cans of beer per week    Comment: social, none since colon surgery  . Drug use: No    Comment: 06/15/2016 "nothing since college"  . Sexual activity: Yes  Lifestyle  . Physical activity:    Days per week: Not on file    Minutes per session: Not on file  . Stress: Not on file  Relationships  . Social connections:    Talks on phone: Not on file    Gets together: Not on file    Attends religious  service: Not on file    Active member of club or organization: Not on file    Attends meetings of clubs or organizations: Not on file    Relationship status: Not on file  . Intimate partner violence:    Fear of current or ex partner: Not on file    Emotionally abused: Not on file    Physically abused: Not on file    Forced sexual activity: Not on file  Other Topics Concern  . Not on file  Social History Narrative  . Not on file   He is married. They have 3 boys, 18-12 yo. 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   FAMILY HISTORY: Family History  Problem Relation Age of Onset  . Cancer Mother        lung cancer  . Stroke Mother   . Hypertension Father   . CAD Father   . Cancer Maternal Grandfather        prostate cancer     ALLERGIES:  is allergic to penicillins and xeloda [capecitabine].  MEDICATIONS:  Current Outpatient Medications  Medication Sig Dispense Refill  . amLODipine (NORVASC) 10 MG tablet Take 1 tablet (10 mg total) by mouth daily. 30 tablet 2  . celecoxib (CELEBREX) 200 MG capsule Take 1 capsule (200 mg total) by mouth daily. 60 capsule 1  . dexamethasone (DECADRON) 4 MG tablet Up to 5 days after chemo (begin day 2) 20 tablet 0  . diphenoxylate-atropine (LOMOTIL) 2.5-0.025 MG tablet Take 2 tablets by mouth 4 (four) times daily as needed for diarrhea or loose stools. 30 tablet 0  . lidocaine-prilocaine (EMLA) cream Apply to affected area once 30 g 3  . lisinopril (PRINIVIL) 10 MG tablet Take 1 tablet (10 mg total) by mouth daily. 90 tablet 2  . LORazepam (ATIVAN) 0.5 MG tablet Take 1 tablet (0.5 mg total) by mouth every 8 (eight) hours as needed (for nausea). 30 tablet 0  . Multiple Vitamin (MULTIVITAMIN WITH MINERALS) TABS tablet Take 1 tablet by mouth daily.    . ondansetron (ZOFRAN ODT) 8 MG disintegrating tablet   Take 1 tablet (8 mg total) by mouth every 8 (eight) hours as needed for nausea or vomiting. 30 tablet 2  . prochlorperazine (COMPAZINE) 10 MG  tablet Take 1 tablet (10 mg total) by mouth every 6 (six) hours as needed for nausea or vomiting. 30 tablet 3  . traMADol (ULTRAM) 50 MG tablet Take by mouth 2 (two) times daily.    . traMADol (ULTRAM) 50 MG tablet Take 1 tablet (50 mg total) by mouth every 6 (six) hours as needed. 30 tablet 0  . urea (CARMOL) 10 % cream Apply topically 3 (three) times daily. 71 g 1  . venlafaxine XR (EFFEXOR-XR) 75 MG 24 hr capsule Take 1 capsule (75 mg total) by mouth daily with breakfast. 30 capsule 1   No current facility-administered medications for this visit.    Facility-Administered Medications Ordered in Other Visits  Medication Dose Route Frequency Provider Last Rate Last Dose  . clindamycin (CLEOCIN) 900 mg in dextrose 5 % 50 mL IVPB  900 mg Intravenous 60 min Pre-Op Byerly, Faera, MD       And  . gentamicin (GARAMYCIN) 450 mg in dextrose 5 % 50 mL IVPB  5 mg/kg Intravenous 60 min Pre-Op Byerly, Faera, MD      . dexamethasone (DECADRON) injection 10 mg  10 mg Intravenous Once Tanner, Van E., PA-C      . diphenoxylate-atropine (LOMOTIL) 2.5-0.025 MG per tablet 2 tablet  2 tablet Oral Once Tanner, Van E., PA-C      . fluorouracil (ADRUCIL) 4,600 mg in sodium chloride 0.9 % 58 mL chemo infusion  2,200 mg/m2 (Treatment Plan Recorded) Intravenous 1 day or 1 dose , , MD   4,600 mg at 09/27/17 1422  . heparin lock flush 100 unit/mL  500 Units Intracatheter Once PRN , , MD      . sodium chloride flush (NS) 0.9 % injection 10 mL  10 mL Intracatheter PRN , , MD         REVIEW OF SYSTEMS:  Constitutional: Denies fevers, chills or abnormal night sweats (+)improved energy  Eyes: Denies blurriness of vision, double vision or watery eyes Ears, nose, mouth, throat, and face: (+) ongoing nasal dripping Respiratory:negative  Cardiovascular: Denies palpitation, chest discomfort or lower extremity swelling Gastrointestinal:  Denies heartburn or change in bowel habits   Lymphatics: Denies new  lymphadenopathy or easy bruising Neurological: negative MSK: Muscle weakness in hands Behavioral/Psych: Mood is stable, no new changes (+) Depression, anxiety, moody swings, improved  All other systems were reviewed with the patient and are negative.  PHYSICAL EXAMINATION:  ECOG PERFORMANCE STATUS: 1 - Symptomatic but completely ambulatory  Vitals:   09/27/17 1124  BP: (!) 118/95  Pulse: 73  Resp: 18  Temp: 98.7 F (37.1 C)  TempSrc: Oral  SpO2: 100%  Weight: 198 lb 14.4 oz (90.2 kg)  Height: 5' 11" (1.803 m)     GENERAL:alert, no distress and comfortable. SKIN: skin color, texture, turgor are normal, no rashes or significant lesions EYES: normal, conjunctiva are pink and non-injected, sclera clear OROPHARYNX:no exudate, no erythema and lips, buccal mucosa, and tongue normal   NECK: supple, thyroid normal size, non-tender, without nodularity LYMPH:  no palpable lymphadenopathy in the cervical, axillary or inguinal LUNGS: clear to auscultation and percussion with normal breathing effort HEART: regular rate & rhythm and no murmurs and no lower extremity edema ABDOMEN:abdomen soft, Surgical scar in the midline around the umbilical has healed well. non-tender and normal bowel sounds Musculoskeletal:no cyanosis of   digits and no clubbing  PSYCH: alert & oriented x 3 with fluent speech NEURO: no focal motor/sensory deficits   LABORATORY DATA:  I have reviewed the data as listed CBC Latest Ref Rng & Units 09/27/2017 09/13/2017 08/30/2017  WBC 4.0 - 10.3 K/uL 4.3 4.3 3.2(L)  Hemoglobin 13.0 - 17.1 g/dL 13.7 13.0 13.6  Hematocrit 38.4 - 49.9 % 40.5 38.2(L) 40.1  Platelets 140 - 400 K/uL 119(L) 139(L) 156   CMP Latest Ref Rng & Units 09/27/2017 09/13/2017 08/30/2017  Glucose 70 - 140 mg/dL 128 86 77  BUN 7 - 26 mg/dL 11 20 10  Creatinine 0.70 - 1.30 mg/dL 1.16 1.17 0.98  Sodium 136 - 145 mmol/L 138 137 141  Potassium 3.5 - 5.1 mmol/L 4.1 4.7 4.3  Chloride 98 - 109 mmol/L 104 106 108    CO2 22 - 29 mmol/L 25 24 26  Calcium 8.4 - 10.4 mg/dL 9.1 9.4 9.7  Total Protein 6.4 - 8.3 g/dL 7.1 7.0 7.2  Total Bilirubin 0.2 - 1.2 mg/dL 0.6 0.5 0.5  Alkaline Phos 40 - 150 U/L 52 49 50  AST 5 - 34 U/L 25 20 21  ALT 0 - 55 U/L 20 16 16   PATHOLOGY   Diagnosis 06/14/2016 1. Colon, segmental resection for tumor, Right Ascending Hemicolectomy and Small Bowel - INVASIVE WELL TO MODERATELY DIFFERENTIATED ADENOCARCINOMA. - TWO TUMOR FOCI MEASURING 7.5 CM AND 4.5 CM IN GREATEST DIMENSION. - TUMOR INVADES THROUGH FULL THICKNESS OF COLON, THROUGH THE SEROSA TO INVOLVE THE SMALL BOWEL. - MARGINS ARE NEGATIVE. - TWENTY FOUR OF SIXTY FOUR LYMPH NODES POSITIVE FOR METASTATIC ADENOCARCINOMA (24/64). - EXTRACAPSULAR EXTENSION IDENTIFIED - MULTIPLE SATELLITE TUMOR DEPOSITS PRESENT. - SEE ONCOLOGY TEMPLATE. 2. Liver, needle/core biopsy, Right Inferior - POSITIVE FOR METASTATIC ADENOCARCINOMA.    RADIOGRAPHIC STUDIES: I have personally reviewed his outside CT scan from 05/29/2016 and agreed with the findings in the report. No results found. MR Abdomen W WO Contrast 01/11/2017 IMPRESSION: 1. No acute findings within the abdomen. Previously noted liver metastasis has resolved in the interval. No new lesions.  CT CAP 09/04/16 IMPRESSION: Interval right colectomy. Stable small liver metastasis in the inferior right hepatic lobe. Stable mild porta hepatis and aortocaval lymphadenopathy. Stable mild mediastinal lymphadenopathy. No new or progressive metastatic disease identified within the chest, abdomen, or pelvis.  ASSESSMENT & PLAN:  56 y.o. Caucasian male, without significant past medical history, presented with rectal bleeding in December 2016, colonoscopy showed 4 polyps, 2 of them showed invasive adenocarcinoma, unfortunately he was not aware of the pathology findings and was not treated. He now presented with fatigue, weight loss, anorexia and abdominal pain  1. Right colon cancer  with liver and node metastasis, pT4bN2bM1a, stage IV, MSI-stable, KRAS G12D mutation (+) -I previously reviewed her PET scan findings, which showed solitary liver metastasis, abdominal and possible thoracic node metastasis  -His liver biopsy confirmed metastasis -Due to the bowel obstruction, he underwent upfront hemicolectomy. I previously reviewed his surgical pathology findings, which showed 2 primary right colon cancer, very locally advanced with 24 lymph nodes positive, surgical margins were negative. -I previously discussed his Foundation one genomic testing results, which showed care arrest mutation, and as high stable, low tumor burden. So he would not benefit from EGFR inhibitor, or immunotherapy alone. -The was started on palliative first-line chemotherapy FOLFOX on 07/13/16. Avastin was added from cycle 3 -He has solitary liver metastasis, but multiple mild hypermetabolic adenopathy in mediastinum and abdomen. We previously discussed the possible   local therapy options, such as liver metastectomy or ablation if he has great response to chemo and his node metastasis resolves  -restaging CT from 09/04/2016 reviewed with pt, stable disease. Will continue chemo -he developed side effects from FOLFOX, and a worsening thrombocytopenia, despite reduced dose of oxaliplatin  -Due to his overall limited response to FOLFOX, and his wish to pursue surgery, I previously recommend him to change chemo to FOLFIRINOX starting with cycle 7 with low dose oxaliplatin, hopefully he will response better. -Due to his severe nausea and vomiting, anorexia, mild diarrhea, reduced his irinotecan dose from cycle 10  - PET scan from 12/19/2016 reviewed in person. He had near complete metabolic response from chemotherapy, post his liver metastasis and hypermetabolic lymph nodes are negative on PET scan with decreased size.  -Due to his worsening neuropathy, and good response to chemotherapy, I previously stopped oxaliplatin from  cycle 11, and continue FOLFIRI and avastin   -Reviewed his restaging abdominal MRI findings, which showed no visible lesions in the liver. He has had a complete radiographic response -He was seen by interventional radiologist Dr. Earleen Newport, who does not think he needed a liver ablation since the solid liver met has resolved after chemo. I agree with his plan.  -He was recently seen by surgeon Dr. Barry Dienes, who recommend maintenance chemotherapy for now, no indication for resection at this point given -Given his excellent response to chemotherapy, and the poor tolerance to intravenous chemotherapy, I recommended him to change treatments to maintenance chemotherapy therapy -He started Xeloda and Avastin maintenance therapy 02/01/17, due to very poor tolerance he stopped Xeloda 02/14/17 -he has been on  5-Fu pump and avastin starting 02/22/17.  -Patient would like to come off chemo if possible. I suggest another 3-4 months maintenance therapy and repeat a scan before discussing stopping chemo. He agreed.   -The patient favors to take chemo break if the next PET scan is completely negative. He understands his cancer is probably not cured, and possible disease progression after stopping chemo. -We discussed his PET from 07/09/17 which shows a stable and a new small nodule in the lungs, indeterminate cause at this time. Otherwise no findings of hypermetabolic recurrent or metastatic disease. Will repeat CT Chest in 2-3 months to monitor his lung nodules. I encouraged him to continue with his current maintenance chemo regimen. Per Pt request I will give him a month of chemo break after 07/12/17 cycle of chemo.  -I previously discussed continuing maintenance chemo until next scan in the end of 09/2017. If scan results are good, may stop his maintenance chemo. He understands and agrees.  -Labs today, 09/27/2017 show CBC with RBC at 4.11, MCV at 98.5, RDW at 14.9, PLT at 119K, and 0.7. CMP WNL  -Labs adequate to proceed with  treatment today.  -IV Emend was added for last cycle, but he had slightly prolonged mild nausea afterwards.  We will hold IV Emend today  -Will order scans within the next 2 weeks. I discussed that if the scans looked good then we will make a decision regarding his chemotherapy following.  -Will prescribe 4 mg dexamethasone daily for 3-5 days following chemotherapy for nausea.  -CT CAP with contrast in 10-12 days   2. Depression, Anxiety  -Pt has long-standing history of depression, has been on Prozac for decades, it was very well controlled in the past.   -He has become more depressed lately, due to the stress from cancer diagnosis, especially stage IV, with prolonged chemotherapy, stress  from work and family responsibility, etc.  He has developed multiple symptoms, including depressed mood, insomnia, personality change, etc.  He also has suicidal ideas. -He has good family and social support.   -We had a long discussion about depression management.  -I previously discussed the role of antidepressants, he has been on Prozac 40 mg for several years. I suggest changing to Effexor. He has weaned off Prozac in 2 weeks. Started Effexor 75mg on week two of taper, will titrate dose if needed. -I also suggested he start attending GI Cancer support group, I had GI Navigator Dawn speak with him more about this and other resources. Dawn recommend him to see psychologist Aron Stewart, he has met her twice  -His mood has improved after meeting with counselor. Effexor is working for him. Will continue  -He followed up with the counselor twice thus far.    3. Hypertension  -We previously discussed Avastin can cause hypertension -I previously started him on amlodipine, and increase dose to 10 mg daily -He does not want to follow-up with his primary care physician. -Previously, his blood pressure was noted to be elevated, especially diastolic. I previously added on lisinopril 10 mg daily for him -Continue  Norvasc and Lisinopril for now while on Avastin -Will continue to monitor -BP better controlled with Lisinopril and amlodipine -His blood pressure was high in the infusion room, and gave him 0.1 mg clonidine on 07/12/17 -BP normal today at 118/89, better controlled.  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 -I previously recommend him to take oral iron supplement over-the-counter, potential side effects of constipation and gastric or discomfort or discussed with him. He stopped taking this before surgery. -I previously advised him to begin taking oral iron supplements again post surgery -Hg normal and stable at 13.1 and iron study has been normal lately. Will continue to monitor.    5. Anorexia and weight loss secondary to chemo  -Secondary to underlying malignancy -I previously encouraged him to try nutritional supplement, such as boost or initial and try to eat more -He was encouraged to continue eating well and take supplemental drinks as needed to better maintain weight.  -I previously discussed how his depression can effect his appetite and to make sure he is eating and drinking well.  -weight has been increasing lately, will monitor.   6. Peripheral neuropathy, G1  -Secondary to oxaliplatin, dose reduced and ultimately discontinued -Slightly worsened when he was on FOLFIRI, held Irinotecan beginning 01/04/17 -Improved some, his hand function normalized, no balance issue, discussed with patient this may take some time to resolve.  - I strongly encouraged him to take vitamin B-complex supplements -Not mentioned in today's visit, Likely stable or improving. Will continue to monitor.   7. Goal of care discussion  -We previously discussed the incurable nature of his cancer, and the overall poor prognosis, especially if he does not have good response to chemotherapy or progress on chemo -The patient understands the goal of  care is palliative.  -He is full code now     Plan  -Labs reviewed an adequate to proceed with chemo today, will hold iv Emend -Will refill celebrex today -Will prescribe 4 mg dexamethasone daily for 3-5 days following chemotherapy for nausea.  -CT CAP with contrast in 10-12 days, before next visit    No orders of the defined types were placed in this encounter.   All questions were answered.     The patient knows to call the clinic with any problems, questions or concerns.  I spent 15 minutes counseling the patient face to face. The total time spent in the appointment was 20 minutes and more than 50% was on counseling.  This document serves as a record of services personally performed by Truitt Merle, MD. It was created on her behalf by Steva Colder, a trained medical scribe. The creation of this record is based on the scribe's personal observations and the provider's statements to them.   I have reviewed the above documentation for accuracy and completeness, and I agree with the above.   Truitt Merle  09/27/2017

## 2017-09-27 ENCOUNTER — Inpatient Hospital Stay: Payer: BLUE CROSS/BLUE SHIELD

## 2017-09-27 ENCOUNTER — Inpatient Hospital Stay (HOSPITAL_BASED_OUTPATIENT_CLINIC_OR_DEPARTMENT_OTHER): Payer: BLUE CROSS/BLUE SHIELD | Admitting: Hematology

## 2017-09-27 ENCOUNTER — Encounter: Payer: Self-pay | Admitting: Hematology

## 2017-09-27 VITALS — BP 118/95 | HR 73 | Temp 98.7°F | Resp 18 | Ht 71.0 in | Wt 198.9 lb

## 2017-09-27 DIAGNOSIS — Z95828 Presence of other vascular implants and grafts: Secondary | ICD-10-CM

## 2017-09-27 DIAGNOSIS — R911 Solitary pulmonary nodule: Secondary | ICD-10-CM

## 2017-09-27 DIAGNOSIS — Z79899 Other long term (current) drug therapy: Secondary | ICD-10-CM

## 2017-09-27 DIAGNOSIS — C787 Secondary malignant neoplasm of liver and intrahepatic bile duct: Principal | ICD-10-CM

## 2017-09-27 DIAGNOSIS — R634 Abnormal weight loss: Secondary | ICD-10-CM

## 2017-09-27 DIAGNOSIS — D696 Thrombocytopenia, unspecified: Secondary | ICD-10-CM

## 2017-09-27 DIAGNOSIS — C189 Malignant neoplasm of colon, unspecified: Secondary | ICD-10-CM

## 2017-09-27 DIAGNOSIS — G629 Polyneuropathy, unspecified: Secondary | ICD-10-CM

## 2017-09-27 DIAGNOSIS — E860 Dehydration: Secondary | ICD-10-CM | POA: Diagnosis not present

## 2017-09-27 DIAGNOSIS — C771 Secondary and unspecified malignant neoplasm of intrathoracic lymph nodes: Secondary | ICD-10-CM | POA: Diagnosis not present

## 2017-09-27 DIAGNOSIS — D500 Iron deficiency anemia secondary to blood loss (chronic): Secondary | ICD-10-CM | POA: Diagnosis not present

## 2017-09-27 DIAGNOSIS — I10 Essential (primary) hypertension: Secondary | ICD-10-CM

## 2017-09-27 DIAGNOSIS — R630 Anorexia: Secondary | ICD-10-CM

## 2017-09-27 DIAGNOSIS — C182 Malignant neoplasm of ascending colon: Secondary | ICD-10-CM

## 2017-09-27 LAB — COMPREHENSIVE METABOLIC PANEL
ALBUMIN: 4.1 g/dL (ref 3.5–5.0)
ALT: 20 U/L (ref 0–55)
ANION GAP: 9 (ref 3–11)
AST: 25 U/L (ref 5–34)
Alkaline Phosphatase: 52 U/L (ref 40–150)
BUN: 11 mg/dL (ref 7–26)
CALCIUM: 9.1 mg/dL (ref 8.4–10.4)
CO2: 25 mmol/L (ref 22–29)
Chloride: 104 mmol/L (ref 98–109)
Creatinine, Ser: 1.16 mg/dL (ref 0.70–1.30)
GFR calc Af Amer: >60 mL/min (ref >60)
GFR calc non Af Amer: >60 mL/min (ref >60)
Glucose, Bld: 128 mg/dL (ref 70–140)
POTASSIUM: 4.1 mmol/L (ref 3.5–5.1)
SODIUM: 138 mmol/L (ref 136–145)
TOTAL PROTEIN: 7.1 g/dL (ref 6.4–8.3)
Total Bilirubin: 0.6 mg/dL (ref 0.2–1.2)

## 2017-09-27 LAB — CBC WITH DIFFERENTIAL/PLATELET
BASOS ABS: 0 10*3/uL (ref 0.0–0.1)
BASOS PCT: 1 %
Eosinophils Absolute: 0.1 10*3/uL (ref 0.0–0.5)
Eosinophils Relative: 2 %
HEMATOCRIT: 40.5 % (ref 38.4–49.9)
HEMOGLOBIN: 13.7 g/dL (ref 13.0–17.1)
Lymphocytes Relative: 16 %
Lymphs Abs: 0.7 10*3/uL — ABNORMAL LOW (ref 0.9–3.3)
MCH: 33.3 pg (ref 27.2–33.4)
MCHC: 33.8 g/dL (ref 32.0–36.0)
MCV: 98.5 fL — ABNORMAL HIGH (ref 79.3–98.0)
MONO ABS: 0.3 10*3/uL (ref 0.1–0.9)
Monocytes Relative: 7 %
NEUTROS ABS: 3.2 10*3/uL (ref 1.5–6.5)
NEUTROS PCT: 74 %
Platelets: 119 10*3/uL — ABNORMAL LOW (ref 140–400)
RBC: 4.11 MIL/uL — ABNORMAL LOW (ref 4.20–5.82)
RDW: 14.9 % — AB (ref 11.0–14.6)
WBC: 4.3 10*3/uL (ref 4.0–10.3)

## 2017-09-27 LAB — TOTAL PROTEIN, URINE DIPSTICK

## 2017-09-27 MED ORDER — CELECOXIB 200 MG PO CAPS
200.0000 mg | ORAL_CAPSULE | Freq: Every day | ORAL | 1 refills | Status: DC
Start: 2017-09-27 — End: 2018-02-21

## 2017-09-27 MED ORDER — PALONOSETRON HCL INJECTION 0.25 MG/5ML
0.2500 mg | Freq: Once | INTRAVENOUS | Status: AC
  Administered 2017-09-27: 0.25 mg via INTRAVENOUS

## 2017-09-27 MED ORDER — SODIUM CHLORIDE 0.9 % IV SOLN
Freq: Once | INTRAVENOUS | Status: AC
  Administered 2017-09-27: 12:00:00 via INTRAVENOUS

## 2017-09-27 MED ORDER — SODIUM CHLORIDE 0.9 % IV SOLN
5.0000 mg/kg | Freq: Once | INTRAVENOUS | Status: AC
  Administered 2017-09-27: 450 mg via INTRAVENOUS
  Filled 2017-09-27: qty 16

## 2017-09-27 MED ORDER — SODIUM CHLORIDE 0.9% FLUSH
10.0000 mL | Freq: Once | INTRAVENOUS | Status: AC
  Administered 2017-09-27: 10 mL
  Filled 2017-09-27: qty 10

## 2017-09-27 MED ORDER — LEUCOVORIN CALCIUM INJECTION 350 MG
400.0000 mg/m2 | Freq: Once | INTRAVENOUS | Status: AC
  Administered 2017-09-27: 840 mg via INTRAVENOUS
  Filled 2017-09-27: qty 42

## 2017-09-27 MED ORDER — DEXAMETHASONE SODIUM PHOSPHATE 10 MG/ML IJ SOLN
10.0000 mg | Freq: Once | INTRAMUSCULAR | Status: AC
  Administered 2017-09-27: 10 mg via INTRAVENOUS

## 2017-09-27 MED ORDER — DEXAMETHASONE SODIUM PHOSPHATE 10 MG/ML IJ SOLN
INTRAMUSCULAR | Status: AC
  Filled 2017-09-27: qty 1

## 2017-09-27 MED ORDER — SODIUM CHLORIDE 0.9 % IV SOLN: 10.0000 mg | Freq: Once | INTRAVENOUS | Status: DC

## 2017-09-27 MED ORDER — DEXAMETHASONE 4 MG PO TABS: ORAL_TABLET | ORAL | 0 refills | Status: DC

## 2017-09-27 MED ORDER — PALONOSETRON HCL INJECTION 0.25 MG/5ML
INTRAVENOUS | Status: AC
  Filled 2017-09-27: qty 5

## 2017-09-27 MED ORDER — SODIUM CHLORIDE 0.9 % IV SOLN
2200.0000 mg/m2 | INTRAVENOUS | Status: DC
  Administered 2017-09-27: 4600 mg via INTRAVENOUS
  Filled 2017-09-27: qty 92

## 2017-09-27 MED ORDER — DEXTROSE 5 % IV SOLN
Freq: Once | INTRAVENOUS | Status: AC
  Administered 2017-09-27: 13:00:00 via INTRAVENOUS

## 2017-09-27 NOTE — Patient Instructions (Signed)
[facility] Discharge Instructions for Patients Receiving Chemotherapy  Today you received the following chemotherapy agents Avastin, Leucovorin and Adrucil  To help prevent nausea and vomiting after your treatment, we encourage you to take your nausea medication as directed. No Zofran for 3 days. Take Compazine instead.    If you develop nausea and vomiting that is not controlled by your nausea medication, call the clinic.   BELOW ARE SYMPTOMS THAT SHOULD BE REPORTED IMMEDIATELY:  *FEVER GREATER THAN 100.5 F  *CHILLS WITH OR WITHOUT FEVER  NAUSEA AND VOMITING THAT IS NOT CONTROLLED WITH YOUR NAUSEA MEDICATION  *UNUSUAL SHORTNESS OF BREATH  *UNUSUAL BRUISING OR BLEEDING  TENDERNESS IN MOUTH AND THROAT WITH OR WITHOUT PRESENCE OF ULCERS  *URINARY PROBLEMS  *BOWEL PROBLEMS  UNUSUAL RASH Items with * indicate a potential emergency and should be followed up as soon as possible.  Feel free to call the clinic should you have any questions or concerns. The clinic phone number is [phone].  Please show the [facility] at check-in to the Emergency Department and triage nurse.

## 2017-09-29 ENCOUNTER — Inpatient Hospital Stay: Payer: BLUE CROSS/BLUE SHIELD

## 2017-09-29 VITALS — BP 119/90 | HR 90 | Temp 98.5°F | Resp 17

## 2017-09-29 DIAGNOSIS — C189 Malignant neoplasm of colon, unspecified: Secondary | ICD-10-CM

## 2017-09-29 DIAGNOSIS — C182 Malignant neoplasm of ascending colon: Secondary | ICD-10-CM | POA: Diagnosis not present

## 2017-09-29 DIAGNOSIS — C787 Secondary malignant neoplasm of liver and intrahepatic bile duct: Principal | ICD-10-CM

## 2017-09-29 MED ORDER — SODIUM CHLORIDE 0.9% FLUSH
10.0000 mL | INTRAVENOUS | Status: DC | PRN
  Administered 2017-09-29: 10 mL
  Filled 2017-09-29: qty 10

## 2017-09-29 MED ORDER — HEPARIN SOD (PORK) LOCK FLUSH 100 UNIT/ML IV SOLN
500.0000 [IU] | Freq: Once | INTRAVENOUS | Status: AC | PRN
  Administered 2017-09-29: 500 [IU]
  Filled 2017-09-29: qty 5

## 2017-09-29 NOTE — Patient Instructions (Signed)

## 2017-10-01 ENCOUNTER — Telehealth: Payer: Self-pay | Admitting: Hematology

## 2017-10-01 NOTE — Telephone Encounter (Signed)
CT CAP with contrast in 10-12 days 5/17 los

## 2017-10-02 ENCOUNTER — Telehealth: Payer: Self-pay | Admitting: Emergency Medicine

## 2017-10-02 NOTE — Telephone Encounter (Addendum)
CT CAP scheduled. Pt instructions given.   ----- Message from Alla Feeling, NP sent at 10/02/2017 10:58 AM EDT ----- Please help to schedule CT CAP before next f/u on 5/30. Scan is authorized.   Thanks, Regan Rakers

## 2017-10-10 ENCOUNTER — Encounter (HOSPITAL_COMMUNITY): Payer: Self-pay | Admitting: Radiology

## 2017-10-10 ENCOUNTER — Ambulatory Visit (HOSPITAL_COMMUNITY)
Admission: RE | Admit: 2017-10-10 | Discharge: 2017-10-10 | Disposition: A | Discharge status: Home / Self Care | Payer: BLUE CROSS/BLUE SHIELD | Source: Ambulatory Visit | Attending: Hematology | Admitting: Hematology

## 2017-10-10 DIAGNOSIS — R918 Other nonspecific abnormal finding of lung field: Secondary | ICD-10-CM | POA: Diagnosis not present

## 2017-10-10 DIAGNOSIS — I77810 Thoracic aortic ectasia: Secondary | ICD-10-CM | POA: Insufficient documentation

## 2017-10-10 DIAGNOSIS — C189 Malignant neoplasm of colon, unspecified: Secondary | ICD-10-CM | POA: Insufficient documentation

## 2017-10-10 DIAGNOSIS — C787 Secondary malignant neoplasm of liver and intrahepatic bile duct: Secondary | ICD-10-CM | POA: Insufficient documentation

## 2017-10-10 DIAGNOSIS — R161 Splenomegaly, not elsewhere classified: Secondary | ICD-10-CM | POA: Diagnosis not present

## 2017-10-10 MED ORDER — IOPAMIDOL (ISOVUE-300) INJECTION 61%
100.0000 mL | Freq: Once | INTRAVENOUS | Status: AC | PRN
  Administered 2017-10-10: 100 mL via INTRAVENOUS

## 2017-10-10 MED ORDER — IOPAMIDOL (ISOVUE-300) INJECTION 61%
INTRAVENOUS | Status: AC
  Filled 2017-10-10: qty 100

## 2017-10-11 ENCOUNTER — Inpatient Hospital Stay: Payer: BLUE CROSS/BLUE SHIELD

## 2017-10-11 ENCOUNTER — Inpatient Hospital Stay (HOSPITAL_BASED_OUTPATIENT_CLINIC_OR_DEPARTMENT_OTHER): Payer: BLUE CROSS/BLUE SHIELD | Admitting: Nurse Practitioner

## 2017-10-11 ENCOUNTER — Encounter: Payer: Self-pay | Admitting: Nurse Practitioner

## 2017-10-11 VITALS — BP 137/92 | HR 68 | Temp 98.2°F | Resp 18 | Ht 71.0 in | Wt 194.6 lb

## 2017-10-11 DIAGNOSIS — R630 Anorexia: Secondary | ICD-10-CM | POA: Diagnosis not present

## 2017-10-11 DIAGNOSIS — C787 Secondary malignant neoplasm of liver and intrahepatic bile duct: Principal | ICD-10-CM

## 2017-10-11 DIAGNOSIS — G629 Polyneuropathy, unspecified: Secondary | ICD-10-CM | POA: Diagnosis not present

## 2017-10-11 DIAGNOSIS — C7800 Secondary malignant neoplasm of unspecified lung: Secondary | ICD-10-CM | POA: Diagnosis not present

## 2017-10-11 DIAGNOSIS — D500 Iron deficiency anemia secondary to blood loss (chronic): Secondary | ICD-10-CM

## 2017-10-11 DIAGNOSIS — C182 Malignant neoplasm of ascending colon: Secondary | ICD-10-CM | POA: Diagnosis not present

## 2017-10-11 DIAGNOSIS — D696 Thrombocytopenia, unspecified: Secondary | ICD-10-CM

## 2017-10-11 DIAGNOSIS — G620 Drug-induced polyneuropathy: Secondary | ICD-10-CM

## 2017-10-11 DIAGNOSIS — C189 Malignant neoplasm of colon, unspecified: Secondary | ICD-10-CM

## 2017-10-11 DIAGNOSIS — I10 Essential (primary) hypertension: Secondary | ICD-10-CM | POA: Diagnosis not present

## 2017-10-11 DIAGNOSIS — Z79899 Other long term (current) drug therapy: Secondary | ICD-10-CM

## 2017-10-11 DIAGNOSIS — C771 Secondary and unspecified malignant neoplasm of intrathoracic lymph nodes: Secondary | ICD-10-CM | POA: Diagnosis not present

## 2017-10-11 DIAGNOSIS — E860 Dehydration: Secondary | ICD-10-CM

## 2017-10-11 DIAGNOSIS — R590 Localized enlarged lymph nodes: Secondary | ICD-10-CM | POA: Diagnosis not present

## 2017-10-11 DIAGNOSIS — T451X5A Adverse effect of antineoplastic and immunosuppressive drugs, initial encounter: Secondary | ICD-10-CM

## 2017-10-11 DIAGNOSIS — Z95828 Presence of other vascular implants and grafts: Secondary | ICD-10-CM

## 2017-10-11 DIAGNOSIS — R634 Abnormal weight loss: Secondary | ICD-10-CM

## 2017-10-11 LAB — CBC WITH DIFFERENTIAL/PLATELET
BASOS ABS: 0 10*3/uL (ref 0.0–0.1)
Basophils Relative: 1 %
EOS PCT: 2 %
Eosinophils Absolute: 0.1 10*3/uL (ref 0.0–0.5)
HCT: 39.8 % (ref 38.4–49.9)
HEMOGLOBIN: 13.6 g/dL (ref 13.0–17.1)
LYMPHS PCT: 17 %
Lymphs Abs: 0.7 10*3/uL — ABNORMAL LOW (ref 0.9–3.3)
MCH: 33.4 pg (ref 27.2–33.4)
MCHC: 34.2 g/dL (ref 32.0–36.0)
MCV: 97.7 fL (ref 79.3–98.0)
Monocytes Absolute: 0.5 10*3/uL (ref 0.1–0.9)
Monocytes Relative: 12 %
NEUTROS ABS: 2.9 10*3/uL (ref 1.5–6.5)
NEUTROS PCT: 68 %
PLATELETS: 151 10*3/uL (ref 140–400)
RBC: 4.08 MIL/uL — AB (ref 4.20–5.82)
RDW: 15.5 % — ABNORMAL HIGH (ref 11.0–14.6)
WBC: 4.2 10*3/uL (ref 4.0–10.3)

## 2017-10-11 LAB — COMPREHENSIVE METABOLIC PANEL
ALT: 18 U/L (ref 0–55)
ANION GAP: 9 (ref 3–11)
AST: 20 U/L (ref 5–34)
Albumin: 4.2 g/dL (ref 3.5–5.0)
Alkaline Phosphatase: 50 U/L (ref 40–150)
BILIRUBIN TOTAL: 0.8 mg/dL (ref 0.2–1.2)
BUN: 11 mg/dL (ref 7–26)
CHLORIDE: 104 mmol/L (ref 98–109)
CO2: 26 mmol/L (ref 22–29)
Calcium: 9.1 mg/dL (ref 8.4–10.4)
Creatinine, Ser: 1.03 mg/dL (ref 0.70–1.30)
Glucose, Bld: 84 mg/dL (ref 70–140)
POTASSIUM: 4.5 mmol/L (ref 3.5–5.1)
Sodium: 139 mmol/L (ref 136–145)
TOTAL PROTEIN: 7.1 g/dL (ref 6.4–8.3)

## 2017-10-11 LAB — IRON AND TIBC
IRON: 98 ug/dL (ref 42–163)
SATURATION RATIOS: 30 % — AB (ref 42–163)
TIBC: 328 ug/dL (ref 202–409)
UIBC: 229 ug/dL

## 2017-10-11 LAB — FERRITIN: FERRITIN: 101 ng/mL (ref 22–316)

## 2017-10-11 LAB — CEA (IN HOUSE-CHCC): CEA (CHCC-In House): 4.95 ng/mL (ref 0.00–5.00)

## 2017-10-11 MED ORDER — PALONOSETRON HCL INJECTION 0.25 MG/5ML
0.2500 mg | Freq: Once | INTRAVENOUS | Status: AC
  Administered 2017-10-11: 0.25 mg via INTRAVENOUS

## 2017-10-11 MED ORDER — PALONOSETRON HCL INJECTION 0.25 MG/5ML
INTRAVENOUS | Status: AC
  Filled 2017-10-11: qty 5

## 2017-10-11 MED ORDER — IRINOTECAN HCL CHEMO INJECTION 100 MG/5ML
160.0000 mg/m2 | Freq: Once | INTRAVENOUS | Status: AC
  Administered 2017-10-11: 340 mg via INTRAVENOUS
  Filled 2017-10-11: qty 17

## 2017-10-11 MED ORDER — LEUCOVORIN CALCIUM INJECTION 350 MG
400.0000 mg/m2 | Freq: Once | INTRAVENOUS | Status: AC
  Administered 2017-10-11: 840 mg via INTRAVENOUS
  Filled 2017-10-11: qty 42

## 2017-10-11 MED ORDER — SODIUM CHLORIDE 0.9 % IV SOLN
5.0000 mg/kg | Freq: Once | INTRAVENOUS | Status: AC
  Administered 2017-10-11: 450 mg via INTRAVENOUS
  Filled 2017-10-11: qty 2

## 2017-10-11 MED ORDER — SODIUM CHLORIDE 0.9% FLUSH
10.0000 mL | Freq: Once | INTRAVENOUS | Status: AC
  Administered 2017-10-11: 10 mL
  Filled 2017-10-11: qty 10

## 2017-10-11 MED ORDER — SODIUM CHLORIDE 0.9 % IV SOLN: 10.0000 mg | Freq: Once | INTRAVENOUS | Status: DC

## 2017-10-11 MED ORDER — SODIUM CHLORIDE 0.9 % IV SOLN
Freq: Once | INTRAVENOUS | Status: AC
  Administered 2017-10-11: 14:00:00 via INTRAVENOUS

## 2017-10-11 MED ORDER — ATROPINE SULFATE 1 MG/ML IJ SOLN
INTRAMUSCULAR | Status: AC
  Filled 2017-10-11: qty 1

## 2017-10-11 MED ORDER — ATROPINE SULFATE 1 MG/ML IJ SOLN
0.5000 mg | Freq: Once | INTRAMUSCULAR | Status: AC
  Administered 2017-10-11: 0.5 mg via INTRAVENOUS

## 2017-10-11 MED ORDER — FLUOROURACIL CHEMO INJECTION 5 GM/100ML
2200.0000 mg/m2 | INTRAVENOUS | Status: DC
  Administered 2017-10-11: 4600 mg via INTRAVENOUS
  Filled 2017-10-11: qty 92

## 2017-10-11 MED ORDER — SODIUM CHLORIDE 0.9 % IV SOLN
Freq: Once | INTRAVENOUS | Status: AC
  Administered 2017-10-11: 14:00:00 via INTRAVENOUS
  Filled 2017-10-11: qty 5

## 2017-10-11 MED ORDER — DEXAMETHASONE SODIUM PHOSPHATE 10 MG/ML IJ SOLN
INTRAMUSCULAR | Status: AC
  Filled 2017-10-11: qty 1

## 2017-10-11 NOTE — Progress Notes (Addendum)
[place]  [phone] [phone]  Clinic Follow up Note   Patient Care Team: Curlene Labrum, MD as PCP - General (Family Medicine) 10/11/2017  SUMMARY OF ONCOLOGIC HISTORY: Oncology History   Cancer Staging Metastatic colon cancer to liver [facility]) Staging form: Colon and Rectum, AJCC 8th Edition - Clinical stage from 06/01/2016: Stage IVA (cTX, cNX, pM1a) - Signed by Truitt Merle, MD on 07/04/2016 - Pathologic stage from 06/14/2016: Stage IVA (pT4b(m), pN2b, pM1a) - Signed by Truitt Merle, MD on 07/04/2016       Metastatic colon cancer to liver ([place])   04/2015 Procedure    Colonoscopy by Dr. Ladona Horns. It showed showed 2 sessile polyps ready between 3-5 mm in size located 20 cm (A, B) from the point of entry, polypectomy was performed. Pedunculated polyp was found in the ascending colon (C), polypectomy was performed, and additional polyp (D) was found 30 cm from the point of entry, removed      04/2015 Pathology Results    tubular adenoma (A and B), and well differentiated adenocarcinoma arising from tubulovillous adenoma (C) and well differentiated adenocarcinoma arising from severe dysplasia to intramucosal carcinoma within tubular adenoma.       04/2015 Initial Diagnosis    Metastatic colon cancer to liver ([place])      05/29/2016 Imaging    CT abdomen and pelvis with contrast showed an apple core like stricture in right colon just above the cecum, measuring 3.2 cm in lengths. This is highly suspicious for malignancy. Small lymph node a noticed he had adjacent mesentery, measuring 8 mm. There is a low-density lesion within the inferior aspect of the right hepatic lobe measuring 1.7 cm, suspicious for metastasis.       06/06/2016 Tumor Marker    CEA 9.99      06/08/2016 PET scan    IMPRESSION: Approximately 3 cm hypermetabolic mass in the ascending colon, consistent with primary colon carcinoma. This mass results in colonic obstruction and small  bowel dilatation. Additional areas of hypermetabolic wall thickening in the cecum may represent other sites of colon carcinoma or colitis. Mild hypermetabolic lymphadenopathy in right pericolonic region, porta hepatis, and aortocaval space, consistent with metastatic disease. Mild hypermetabolic mediastinal lymphadenopathy also seen, and thoracic lymph node metastases cannot be excluded. Solitary hypermetabolic focus in inferior right hepatic lobe, consistent with liver metastasis. Consider abdomen MRI without and with contrast for further evaluation.      06/14/2016 Surgery    Hand assisted right hemicolectomy and small bowel resection for colon cancer, liver biopsy, by Dr. Barry Dienes      06/14/2016 Pathology Results    Right hemicolectomy showed invasive well to moderately differentiated adenocarcinoma, 2 foci measuring 7.5 cm and 4.5 cm, tumor invades through full thickness of colon, to the seroma and involve the Small Bowel, Surgical Margins Were Negative, 24 Out Of 64 Lymph Nodes Were Positive, Extracapsular Extension Identified, Multiple Satellite Tumor Deposits Present, Liver Biopsy Showed Metastatic Adenocarcinoma.        06/14/2016 Miscellaneous    Tumor MMR normal, MSI stable       06/14/2016 Miscellaneous    Foundation one genomic testing showed K-ras G12 D mutation, APC and TP53 mutation. No BRAF and NRAS mutation. MSI-stable, tumor burden low.      07/06/2016 Tumor Marker    CEA 13.69      07/13/2016 - 01/18/2017 Chemotherapy    mFOLFOX, every 2 weeks, started on 07/14/2015, Avastin added from cycle 3  Oxaliplatin dose to  $'60mg'W$ /m2 due to side effects and some cytopenia on 09/21/16  Changed to FOLFIRINOX starting cycle 7 and Reduced  Due to neuropathy hold Oxaliplatin and add Irinotecan with neulasta on day 3 starting with cycle 7 Add low dose Oxaliplatin with cycle 8.  Due to his worsening neuropathy, and good response to chemotherapy, I previously stopped oxaliplatin from cycle 11,  and continue FOLFIRI and avastin         07/27/2016 Tumor Marker    CEA 15.85      09/04/2016 Imaging    Ct C/A/P W Contrast IMPRESSION: Interval right colectomy. Stable small liver metastasis in the inferior right hepatic lobe. Stable mild porta hepatis and aortocaval lymphadenopathy. Stable mild mediastinal lymphadenopathy. No new or progressive metastatic disease identified within the chest, abdomen, or pelvis.      12/19/2016 PET scan    IMPRESSION: 1. Right hemicolectomy, with resolution of the prior hypermetabolic activity inferiorly in the right hepatic lobe, in several mediastinal lymph nodes, and in lymph nodes in the retroperitoneum and porta hepatis. No residual hypermetabolic or enlarged lymph nodes are identified. 2. Low-grade diffuse skeletal metabolic activity is likely therapy related. 3. Coronary atherosclerosis. 4. 3 by 4 mm right middle lobe pulmonary nodule is stable, not appreciably hypermetabolic, but below sensitive PET-CT size thresholds. This may warrant surveillance.      01/11/2017 Imaging    MR Abdomen W WO Contrast IMPRESSION: 1. No acute findings within the abdomen. Previously noted liver metastasis has resolved in the interval. No new lesions.      02/01/2017 -  Chemotherapy    Maintenance therapy, Xeloda '2000mg'$  (1000 mg/m2)  q12h on day 1-14 every 21 days plus AVASTIN, starting 02/01/2017.  stopped after 12 days due to poor tolerance on 02/14/17  Changed to maintenance 5-FU and avastin every 2 weeks starting on 02/22/17        04/10/2017 Imaging    IMPRESSION: 1. Status post right hemicolectomy without findings for recurrent tumor. 2. No worrisome hepatic lesions. Treated disease with only a small residual low attenuation lesion in the right hepatic lobe. 3. No recurrent mediastinal or abdominal lymphadenopathy.       07/09/2017 PET scan    PET 07/09/17 IMPRESSION: 1. Status post right hemicolectomy, without findings of hypermetabolic  recurrent or metastatic disease. 2. A right middle lobe pulmonary nodule is unchanged. However, there is a right lower lobe 5 mm pulmonary nodule which is felt to be new and enlarged compared to prior exams. Suspicious for an isolated pulmonary metastasis. Consider CT follow-up at 3-6 months. 3. Age advanced coronary artery atherosclerosis. Recommend assessment of coronary risk factors and consideration of medical therapy. 4. Borderline ascending aortic dilatation, 4.0 cm        10/10/2017 Imaging    IMPRESSION: 1. Several (at least 10) subcentimeter pulmonary nodules scattered in both lungs, predominantly in the lower lobes, all new/increased, most compatible with enlarging pulmonary metastases, largest 8 mm in the right lower lobe. 2. No additional findings of new or progressive metastatic disease. No recurrent adenopathy. Stable small low-attenuation lesion in the inferior right liver lobe compatible with treated metastasis. No new liver metastases. 3. Stable ectatic 4.0 cm ascending thoracic aorta. Recommend annual imaging followup by CTA or MRA.  4. Stable mild splenomegaly.      CURRENT THERAPY:  Maintenance 5-Fu and Avastin started 01/04/2017, he tried one cycle Xeloda to replace 5-fu, but could not tolerate. Added leucovorin and decreased 5-Fu 10% starting on 03/15/17.   INTERVAL HISTORY: Mr.  Frankie returns for follow-up as scheduled prior to next cycle 5-FU/leucovorin/Avastin.  Feels well, no fatigue.  He has a good appetite but decreased taste. Has lost some weight. No nausea, vomiting, constipation or diarrhea. Neuropathy is stable. No fever, chills, cough, chest pain, sputum, or dyspnea.   REVIEW OF SYSTEMS:   Constitutional: Denies fevers, chills (+) weight loss (+) good appetite (+) decreased taste  Eyes: Denies blurriness of vision Ears, nose, mouth, throat, and face: Denies mucositis or sore throat Respiratory: Denies cough, dyspnea or wheezes Cardiovascular: Denies  palpitation, chest discomfort or lower extremity swelling Gastrointestinal:  Denies nausea, vomiting, constipation, diarrhea, heartburn or change in bowel habits Skin: Denies abnormal skin rashes Lymphatics: Denies new lymphadenopathy or easy bruising Neurological:Denies new weaknesses (+) neuropathy to hands, feet is stable  Behavioral/Psych: Mood is stable, no new changes (+) h/o depression, anxiety, followed by SW  All other systems were reviewed with the patient and are negative.  MEDICAL HISTORY:  Past Medical History:  Diagnosis Date  . Anxiety   . Cancer of ascending colon ([place])   . Seasonal allergies     SURGICAL HISTORY: Past Surgical History:  Procedure Laterality Date  . APPENDECTOMY  1992  . CATARACT EXTRACTION W/ INTRAOCULAR LENS IMPLANT Left 04/2016  . COLON SURGERY    . COLONOSCOPY W/ BIOPSIES AND POLYPECTOMY  04/2015  . INGUINAL HERNIA REPAIR Right 1982  . IR RADIOLOGIST EVAL & MGMT  01/16/2017  . KNEE ARTHROSCOPY W/ MENISCECTOMY Right 1998  . LAPAROSCOPIC RIGHT COLECTOMY N/A 06/14/2016   Procedure: LAPAROSCOPIC HAND ASSISTED HEMICOLECTOMY AND SMALL BOWEL RESECTION.;  Surgeon: [name], MD;  Location: [place];  Service: General;  Laterality: N/A;  . LIVER BIOPSY Right 06/14/2016   Procedure: LIVER BIOPSY;  Surgeon: [name], MD;  Location: [place];  Service: General;  Laterality: Right;  Right Inferior Liver  . PORTACATH PLACEMENT N/A 07/06/2016   Procedure: INSERTION PORT-A-CATH;  Surgeon: [name], MD;  Location: [place];  Service: General;  Laterality: N/A;  . VASECTOMY  2005    I have reviewed the social history and family history with the patient and they are unchanged from previous note.  ALLERGIES:  is allergic to penicillins and xeloda [capecitabine].  MEDICATIONS:  Current Outpatient Medications  Medication Sig Dispense Refill  . amLODipine (NORVASC) 10 MG tablet Take 1 tablet (10 mg total) by mouth daily. 30 tablet 2  .  celecoxib (CELEBREX) 200 MG capsule Take 1 capsule (200 mg total) by mouth daily. 60 capsule 1  . dexamethasone (DECADRON) 4 MG tablet Up to 5 days after chemo (begin day 2) 20 tablet 0  . diphenoxylate-atropine (LOMOTIL) 2.5-0.025 MG tablet Take 2 tablets by mouth 4 (four) times daily as needed for diarrhea or loose stools. 30 tablet 0  . lidocaine-prilocaine (EMLA) cream Apply to affected area once 30 g 3  . lisinopril (PRINIVIL) 10 MG tablet Take 1 tablet (10 mg total) by mouth daily. 90 tablet 2  . LORazepam (ATIVAN) 0.5 MG tablet Take 1 tablet (0.5 mg total) by mouth every 8 (eight) hours as needed (for nausea). 30 tablet 0  . Multiple Vitamin (MULTIVITAMIN WITH MINERALS) TABS tablet Take 1 tablet by mouth daily.    . ondansetron (ZOFRAN ODT) 8 MG disintegrating tablet Take 1 tablet (8 mg total) by mouth every 8 (eight) hours as needed for nausea or vomiting. 30 tablet 2  . prochlorperazine (COMPAZINE) 10 MG tablet Take 1 tablet (10 mg total) by mouth every 6 (  six) hours as needed for nausea or vomiting. 30 tablet 3  . traMADol (ULTRAM) 50 MG tablet Take by mouth 2 (two) times daily.    . traMADol (ULTRAM) 50 MG tablet Take 1 tablet (50 mg total) by mouth every 6 (six) hours as needed. 30 tablet 0  . urea (CARMOL) 10 % cream Apply topically 3 (three) times daily. 71 g 1  . venlafaxine XR (EFFEXOR-XR) 75 MG 24 hr capsule Take 1 capsule (75 mg total) by mouth daily with breakfast. 30 capsule 1   No current facility-administered medications for this visit.    Facility-Administered Medications Ordered in Other Visits  Medication Dose Route Frequency Provider Last Rate Last Dose  . clindamycin (CLEOCIN) 900 mg in dextrose 5 % 50 mL IVPB  900 mg Intravenous 60 min Pre-Op [name], MD       And  . gentamicin (GARAMYCIN) 450 mg in dextrose 5 % 50 mL IVPB  5 mg/kg Intravenous 60 min Pre-Op [name], MD      . dexamethasone (DECADRON) injection 10 mg  10 mg Intravenous Once Harle Stanford.,  PA-C      . diphenoxylate-atropine (LOMOTIL) 2.5-0.025 MG per tablet 2 tablet  2 tablet Oral Once Harle Stanford., PA-C      . heparin lock flush 100 unit/mL  500 Units Intracatheter Once PRN Truitt Merle, MD      . sodium chloride flush (NS) 0.9 % injection 10 mL  10 mL Intracatheter PRN Truitt Merle, MD        PHYSICAL EXAMINATION: ECOG PERFORMANCE STATUS: 1 - Symptomatic but completely ambulatory  Vitals:   10/11/17 1120  BP: (!) 137/92  Pulse: 68  Resp: 18  Temp: 98.2 F (36.8 C)  SpO2: 100%   Filed Weights   10/11/17 1120  Weight: 194 lb 9.6 oz (88.3 kg)    GENERAL: alert, no distress and comfortable SKIN: no rashes or significant lesions. Skin is generally red  EYES: normal, Conjunctiva are pink and non-injected, sclera clear OROPHARYNX:no thrush or ulcers  LYMPH:  no palpable cervical, supraclavicular, axillary, or inguinal lymphadenopathy LUNGS: clear to auscultation with normal breathing effort HEART: regular rate & rhythm and no murmurs and no lower extremity edema ABDOMEN:abdomen soft, non-tender and normal bowel sounds Musculoskeletal:no cyanosis of digits and no clubbing  NEURO: alert & oriented x 3 with fluent speech, no focal motor/sensory deficits PAC without erythema   LABORATORY DATA:  I have reviewed the data as listed CBC Latest Ref Rng & Units 10/11/2017 09/27/2017 09/13/2017  WBC 4.0 - 10.3 K/uL 4.2 4.3 4.3  Hemoglobin 13.0 - 17.1 g/dL 13.6 13.7 13.0  Hematocrit 38.4 - 49.9 % 39.8 40.5 38.2(L)  Platelets 140 - 400 K/uL 151 119(L) 139(L)     CMP Latest Ref Rng & Units 10/11/2017 09/27/2017 09/13/2017  Glucose 70 - 140 mg/dL 84 128 86  BUN 7 - 26 mg/dL '11 11 20  '$ Creatinine 0.70 - 1.30 mg/dL 1.03 1.16 1.17  Sodium 136 - 145 mmol/L 139 138 137  Potassium 3.5 - 5.1 mmol/L 4.5 4.1 4.7  Chloride 98 - 109 mmol/L 104 104 106  CO2 22 - 29 mmol/L '26 25 24  '$ Calcium 8.4 - 10.4 mg/dL 9.1 9.1 9.4  Total Protein 6.4 - 8.3 g/dL 7.1 7.1 7.0  Total Bilirubin 0.2 - 1.2 mg/dL  0.8 0.6 0.5  Alkaline Phos 40 - 150 U/L 50 52 49  AST 5 - 34 U/L '20 25 20  '$ ALT 0 -  55 U/L '18 20 16   '$ PATHOLOGY   Diagnosis 06/14/2016 1. Colon, segmental resection for tumor, Right Ascending Hemicolectomy and Small Bowel - INVASIVE WELL TO MODERATELY DIFFERENTIATED ADENOCARCINOMA. - TWO TUMOR FOCI MEASURING 7.5 CM AND 4.5 CM IN GREATEST DIMENSION. - TUMOR INVADES THROUGH FULL THICKNESS OF COLON, THROUGH THE SEROSA TO INVOLVE THE SMALL BOWEL. - MARGINS ARE NEGATIVE. - TWENTY FOUR OF SIXTY FOUR LYMPH NODES POSITIVE FOR METASTATIC ADENOCARCINOMA (24/64). - EXTRACAPSULAR EXTENSION IDENTIFIED - MULTIPLE SATELLITE TUMOR DEPOSITS PRESENT. - SEE ONCOLOGY TEMPLATE. 2. Liver, needle/core biopsy, Right Inferior - POSITIVE FOR METASTATIC ADENOCARCINOMA.      RADIOGRAPHIC STUDIES: I have personally reviewed the radiological images as listed and agreed with the findings in the report. Ct Chest W Contrast  Result Date: 10/11/2017 CLINICAL DATA:  Stage IV right colon cancer. Ongoing chemotherapy. Restaging. EXAM: CT CHEST, ABDOMEN, AND PELVIS WITH CONTRAST TECHNIQUE: Multidetector CT imaging of the chest, abdomen and pelvis was performed following the standard protocol during bolus administration of intravenous contrast. CONTRAST:  168m ISOVUE-300 IOPAMIDOL (ISOVUE-300) INJECTION 61% COMPARISON:  07/09/2017 PET-CT. 04/10/2017 CT chest, abdomen and pelvis. FINDINGS: CT CHEST FINDINGS Cardiovascular: Normal heart size. Stable trace pericardial effusion/thickening. Left anterior descending coronary atherosclerosis. Left subclavian MediPort terminates in the lower third of the SVC. Stable ectatic 4.0 cm ascending thoracic aorta. Normal caliber pulmonary arteries. No central pulmonary emboli. Mediastinum/Nodes: No discrete thyroid nodules. Unremarkable esophagus. No pathologically enlarged axillary, mediastinal or hilar lymph nodes. Lungs/Pleura: No pneumothorax. No pleural effusion. There are several (at  least 10) solid pulmonary nodules scattered in both lungs, predominantly in the lower lobes, which are new/increased since 07/09/2017 PET-CT and 04/10/2017 chest CT. Representative 8 mm right lower lobe nodule (series 4/image 97), increased from 5 mm on 07/09/2017 and 3 mm on 04/10/2017. Right upper lobe 4 mm nodule (series 4/image 76) is increased from 2 mm on 07/09/2017 and is new since 04/10/2017. Posterior left lower lobe 5 mm nodule (series 4/image 85), increased from 3 mm on 07/09/2017 and 2 mm on 04/10/2017. No acute consolidative airspace disease or lung masses. Musculoskeletal: No aggressive appearing focal osseous lesions. Moderate thoracic spondylosis. CT ABDOMEN PELVIS FINDINGS Hepatobiliary: Normal liver size. Hypodense inferior right liver lobe 1.0 cm lesion (series 2/image 69), not appreciably changed since 04/10/2017 using similar measurement technique. No new liver lesions. Normal gallbladder with no radiopaque cholelithiasis. No biliary ductal dilatation. Pancreas: Normal, with no mass or duct dilation. Spleen: Mild splenomegaly (14.6 cm craniocaudal splenic length), stable. No splenic mass. Adrenals/Urinary Tract: Normal adrenals. Normal kidneys with no hydronephrosis and no renal mass. Normal bladder. Stomach/Bowel: Normal non-distended stomach. Stable postsurgical changes from right hemicolectomy with intact appearing ileocolic anastomosis in upper right abdomen. No small bowel dilatation or wall thickening. Oral contrast progresses to the distal large-bowel. No anastomotic wall thickening or mass. No wall thickening or significant pericolonic fat stranding in the remnant large-bowel. Vascular/Lymphatic: Normal caliber abdominal aorta. Patent portal, splenic, hepatic and renal veins. No pathologically enlarged lymph nodes in the abdomen or pelvis. Reproductive: Stable top-normal prostate with nonspecific internal prostatic calcifications. Other: No pneumoperitoneum, ascites or focal fluid  collection. Musculoskeletal: No aggressive appearing focal osseous lesions. Moderate lumbar spondylosis. IMPRESSION: 1. Several (at least 10) subcentimeter pulmonary nodules scattered in both lungs, predominantly in the lower lobes, all new/increased, most compatible with enlarging pulmonary metastases, largest 8 mm in the right lower lobe. 2. No additional findings of new or progressive metastatic disease. No recurrent adenopathy. Stable small low-attenuation lesion in the inferior  right liver lobe compatible with treated metastasis. No new liver metastases. 3. Stable ectatic 4.0 cm ascending thoracic aorta. Recommend annual imaging followup by CTA or MRA. This recommendation follows 2010 ACCF/AHA/AATS/ACR/ASA/SCA/SCAI/SIR/STS/SVM Guidelines for the Diagnosis and Management of Patients with Thoracic Aortic Disease. Circulation. 2010; 121: X528-U132. 4. Stable mild splenomegaly. Electronically Signed   By: Ilona Sorrel M.D.   On: 10/11/2017 09:36   Ct Abdomen Pelvis W Contrast  Result Date: 10/11/2017 CLINICAL DATA:  Stage IV right colon cancer. Ongoing chemotherapy. Restaging. EXAM: CT CHEST, ABDOMEN, AND PELVIS WITH CONTRAST TECHNIQUE: Multidetector CT imaging of the chest, abdomen and pelvis was performed following the standard protocol during bolus administration of intravenous contrast. CONTRAST:  159m ISOVUE-300 IOPAMIDOL (ISOVUE-300) INJECTION 61% COMPARISON:  07/09/2017 PET-CT. 04/10/2017 CT chest, abdomen and pelvis. FINDINGS: CT CHEST FINDINGS Cardiovascular: Normal heart size. Stable trace pericardial effusion/thickening. Left anterior descending coronary atherosclerosis. Left subclavian MediPort terminates in the lower third of the SVC. Stable ectatic 4.0 cm ascending thoracic aorta. Normal caliber pulmonary arteries. No central pulmonary emboli. Mediastinum/Nodes: No discrete thyroid nodules. Unremarkable esophagus. No pathologically enlarged axillary, mediastinal or hilar lymph nodes.  Lungs/Pleura: No pneumothorax. No pleural effusion. There are several (at least 10) solid pulmonary nodules scattered in both lungs, predominantly in the lower lobes, which are new/increased since 07/09/2017 PET-CT and 04/10/2017 chest CT. Representative 8 mm right lower lobe nodule (series 4/image 97), increased from 5 mm on 07/09/2017 and 3 mm on 04/10/2017. Right upper lobe 4 mm nodule (series 4/image 76) is increased from 2 mm on 07/09/2017 and is new since 04/10/2017. Posterior left lower lobe 5 mm nodule (series 4/image 85), increased from 3 mm on 07/09/2017 and 2 mm on 04/10/2017. No acute consolidative airspace disease or lung masses. Musculoskeletal: No aggressive appearing focal osseous lesions. Moderate thoracic spondylosis. CT ABDOMEN PELVIS FINDINGS Hepatobiliary: Normal liver size. Hypodense inferior right liver lobe 1.0 cm lesion (series 2/image 69), not appreciably changed since 04/10/2017 using similar measurement technique. No new liver lesions. Normal gallbladder with no radiopaque cholelithiasis. No biliary ductal dilatation. Pancreas: Normal, with no mass or duct dilation. Spleen: Mild splenomegaly (14.6 cm craniocaudal splenic length), stable. No splenic mass. Adrenals/Urinary Tract: Normal adrenals. Normal kidneys with no hydronephrosis and no renal mass. Normal bladder. Stomach/Bowel: Normal non-distended stomach. Stable postsurgical changes from right hemicolectomy with intact appearing ileocolic anastomosis in upper right abdomen. No small bowel dilatation or wall thickening. Oral contrast progresses to the distal large-bowel. No anastomotic wall thickening or mass. No wall thickening or significant pericolonic fat stranding in the remnant large-bowel. Vascular/Lymphatic: Normal caliber abdominal aorta. Patent portal, splenic, hepatic and renal veins. No pathologically enlarged lymph nodes in the abdomen or pelvis. Reproductive: Stable top-normal prostate with nonspecific internal prostatic  calcifications. Other: No pneumoperitoneum, ascites or focal fluid collection. Musculoskeletal: No aggressive appearing focal osseous lesions. Moderate lumbar spondylosis. IMPRESSION: 1. Several (at least 10) subcentimeter pulmonary nodules scattered in both lungs, predominantly in the lower lobes, all new/increased, most compatible with enlarging pulmonary metastases, largest 8 mm in the right lower lobe. 2. No additional findings of new or progressive metastatic disease. No recurrent adenopathy. Stable small low-attenuation lesion in the inferior right liver lobe compatible with treated metastasis. No new liver metastases. 3. Stable ectatic 4.0 cm ascending thoracic aorta. Recommend annual imaging followup by CTA or MRA. This recommendation follows 2010 ACCF/AHA/AATS/ACR/ASA/SCA/SCAI/SIR/STS/SVM Guidelines for the Diagnosis and Management of Patients with Thoracic Aortic Disease. Circulation. 2010; 121: [id number] 4. Stable mild splenomegaly. Electronically Signed   By:  Ilona Sorrel M.D.   On: 10/11/2017 09:36     ASSESSMENT & PLAN: 57 y.o. Caucasian male, without significant past medical history, presented with rectal bleeding in December 2016, colonoscopy showed 4 polyps, 2 of them showed invasive adenocarcinoma, unfortunately he was not aware of the pathology findings and was not treated. He now presented with fatigue, weight loss, anorexia and abdominal pain  1. Right colon cancer with liver and node metastasis, pT4bN2bM1a, stage IV, MSI-stable, KRAS G12D mutation (+) 2. Depression, anxiety 3. HTN 4. Anemia of iron deficiency and related to chemotherapy  5. Anorexia and weight loss, secondary to chemo  6. Peripheral neuropathy, G1 7. Goals of care discussions   Mr. Owczarzak appears stable. He completed another cycle of 5FU/leucovorin/avastin on 09/27/17. He tolerated last cycle well overall. Neuropathy is stable. Weight is slightly decreased, he has little interest in eating. I discussed appetite  stimulant, he declines for now.  We reviewed restaging CT CAP on 10/10/2017, which shows several (at least 10 (sub-centimeters pulmonary nodules scattered in both lungs all new/increased from previous. No new liver lesions or metastasis in the abdomen or pelvis. Patient was seen with [name] who reviewed the images with the patient and his spouse who was on the phone. Nodules are small, below PET threshold, too small to biopsy. He does not have infectious symptoms, not likely to be inflammatory findings. [name] feels this is likely metastatic colon cancer.  Mr. Mitcheltree is visibly upset, but understanding. He agrees to meet with Edwyna Shell, LCSW today if available.   Previously had good response to FOLFIRI, last dose 12/2016 when he was changed to maintenance regimen 5FU/leucovorin/avastin due to increasing side effects from intensive chemo and good response on imaging, he did not progress on FOLFIRI. [name] recommends to restart irinotecan. Insurance approval is still valid. Labs reviewed, adequate for chemotherapy today. CEA remains in normal range, iron studies adequate. Proceed with FOLFIRI/avastin today, continue q2 weeks. Plan to restage in 2-3 months. We reviewed side effects such as increased fatigue, nausea, and diarrhea on irinotecan, he has supportive meds at home ready to use.    PLAN: -Labs and imaging reviewed, restart FOLFIRI/avastin today, continue q2 weeks -F/u in 2 weeks for next cycle -He will return 3 weeks later for therapy due to family vacation -Request f/u with social work today   All questions were answered. The patient knows to call the clinic with any problems, questions or concerns. No barriers to learning was detected.     Alla Feeling, NP 10/11/17   Addendum  I have seen the patient, examined him. I agree with the assessment and and plan and have edited the notes.   I reviewed Chris's restaging CT scan images with him in person, and his wife on the phone.  He has developed multiple small lung nodules, largest 8m, highly suspicious for lung metastasis, no other new findings. He is asymptomatic, clinically doing very well. He was quite overwhelmed by the cancer progression, and broke into tears several times. We offered emotional support and will ask SW AWebb Silversmithto talk to him today. He seems better afterwards, and denies any additional needs.  I recommend him to change maintenance 5-FU to FOLFIRI and.  We previously held irinotecan due to complete radiographic response, and mild side effects.  He is agreeable with the plan.  We will start today.  Visual side effects, especially diarrhea management were discussed with him again today. Will continue every 2 weeks.  YKrista Blue  Burr Medico  10/11/2017

## 2017-10-11 NOTE — Patient Instructions (Signed)
[facility] Discharge Instructions for Patients Receiving Chemotherapy  Today you received the following chemotherapy agents Avastin, Leucovorin, Irinotecan and Adrucil  To help prevent nausea and vomiting after your treatment, we encourage you to take your nausea medication as directed. No Zofran for 3 days. Take Compazine instead.    If you develop nausea and vomiting that is not controlled by your nausea medication, call the clinic.   BELOW ARE SYMPTOMS THAT SHOULD BE REPORTED IMMEDIATELY:  *FEVER GREATER THAN 100.5 F  *CHILLS WITH OR WITHOUT FEVER  NAUSEA AND VOMITING THAT IS NOT CONTROLLED WITH YOUR NAUSEA MEDICATION  *UNUSUAL SHORTNESS OF BREATH  *UNUSUAL BRUISING OR BLEEDING  TENDERNESS IN MOUTH AND THROAT WITH OR WITHOUT PRESENCE OF ULCERS  *URINARY PROBLEMS  *BOWEL PROBLEMS  UNUSUAL RASH Items with * indicate a potential emergency and should be followed up as soon as possible.  Feel free to call the clinic should you have any questions or concerns. The clinic phone number is [phone].  Please show the [facility] at check-in to the Emergency Department and triage nurse.   Irinotecan injection What is this medicine? IRINOTECAN (ir in oh TEE kan ) is a chemotherapy drug. It is used to treat colon and rectal cancer. This medicine may be used for other purposes; ask your health care provider or pharmacist if you have questions. COMMON BRAND NAME(S): Camptosar What should I tell my health care provider before I take this medicine? They need to know if you have any of these conditions: -blood disorders -dehydration -diarrhea -infection (especially a virus infection such as chickenpox, cold sores, or herpes) -liver disease -low blood counts, like low white cell, platelet, or red cell counts -recent or ongoing radiation therapy -an unusual or allergic reaction to irinotecan, sorbitol, other chemotherapy, other medicines, foods, dyes, or  preservatives -pregnant or trying to get pregnant -breast-feeding How should I use this medicine? This drug is given as an infusion into a vein. It is administered in a hospital or clinic by a specially trained health care professional. Talk to your pediatrician regarding the use of this medicine in children. Special care may be needed. Overdosage: If you think you have taken too much of this medicine contact a poison control center or emergency room at once. NOTE: This medicine is only for you. Do not share this medicine with others. What if I miss a dose? It is important not to miss your dose. Call your doctor or health care professional if you are unable to keep an appointment. What may interact with this medicine? Do not take this medicine with any of the following medications: -atazanavir -certain medicines for fungal infections like itraconazole and ketoconazole -St. John's Wort This medicine may also interact with the following medications: -dexamethasone -diuretics -laxatives -medicines for seizures like carbamazepine, mephobarbital, phenobarbital, phenytoin, primidone -medicines to increase blood counts like filgrastim, pegfilgrastim, sargramostim -prochlorperazine -vaccines This list may not describe all possible interactions. Give your health care provider a list of all the medicines, herbs, non-prescription drugs, or dietary supplements you use. Also tell them if you smoke, drink alcohol, or use illegal drugs. Some items may interact with your medicine. What should I watch for while using this medicine? Your condition will be monitored carefully while you are receiving this medicine. You will need important blood work done while you are taking this medicine. This drug may make you feel generally unwell. This is not uncommon, as chemotherapy can affect healthy cells as well as cancer  cells. Report any side effects. Continue your course of treatment even though you feel ill unless  your doctor tells you to stop. In some cases, you may be given additional medicines to help with side effects. Follow all directions for their use. You may get drowsy or dizzy. Do not drive, use machinery, or do anything that needs mental alertness until you know how this medicine affects you. Do not stand or sit up quickly, especially if you are an older patient. This reduces the risk of dizzy or fainting spells. Call your doctor or health care professional for advice if you get a fever, chills or sore throat, or other symptoms of a cold or flu. Do not treat yourself. This drug decreases your body's ability to fight infections. Try to avoid being around people who are sick. This medicine may increase your risk to bruise or bleed. Call your doctor or health care professional if you notice any unusual bleeding. Be careful brushing and flossing your teeth or using a toothpick because you may get an infection or bleed more easily. If you have any dental work done, tell your dentist you are receiving this medicine. Avoid taking products that contain aspirin, acetaminophen, ibuprofen, naproxen, or ketoprofen unless instructed by your doctor. These medicines may hide a fever. Do not become pregnant while taking this medicine. Women should inform their doctor if they wish to become pregnant or think they might be pregnant. There is a potential for serious side effects to an unborn child. Talk to your health care professional or pharmacist for more information. Do not breast-feed an infant while taking this medicine. What side effects may I notice from receiving this medicine? Side effects that you should report to your doctor or health care professional as soon as possible: -allergic reactions like skin rash, itching or hives, swelling of the face, lips, or tongue -low blood counts - this medicine may decrease the number of white blood cells, red blood cells and platelets. You may be at increased risk for  infections and bleeding. -signs of infection - fever or chills, cough, sore throat, pain or difficulty passing urine -signs of decreased platelets or bleeding - bruising, pinpoint red spots on the skin, black, tarry stools, blood in the urine -signs of decreased red blood cells - unusually weak or tired, fainting spells, lightheadedness -breathing problems -chest pain -diarrhea -feeling faint or lightheaded, falls -flushing, runny nose, sweating during infusion -mouth sores or pain -pain, swelling, redness or irritation where injected -pain, swelling, warmth in the leg -pain, tingling, numbness in the hands or feet -problems with balance, talking, walking -stomach cramps, pain -trouble passing urine or change in the amount of urine -vomiting as to be unable to hold down drinks or food -yellowing of the eyes or skin Side effects that usually do not require medical attention (report to your doctor or health care professional if they continue or are bothersome): -constipation -hair loss -headache -loss of appetite -nausea, vomiting -stomach upset This list may not describe all possible side effects. Call your doctor for medical advice about side effects. You may report side effects to FDA at 1-800-FDA-1088. Where should I keep my medicine? This drug is given in a hospital or clinic and will not be stored at home. NOTE: This sheet is a summary. It may not cover all possible information. If you have questions about this medicine, talk to your doctor, pharmacist, or health care provider.  2018 Elsevier/Gold Standard (2012-10-28 16:29:32)

## 2017-10-11 NOTE — Progress Notes (Signed)
[facility] CSW Progress Note  Request received from Dr Burr Medico to meet w patient and assess for coping skills related to recent change in treatment plan/chemo protocol.  Spoke w patient in infusion room.  Admits to being somewhat overwhelmed and discouraged by recent scan results which revealed advancing disease.  Patient displays appropriate coping skills and reaction to this new information.  States that he has support from his faith community and is able to make meaning from this experience.  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  Does see counselor for additional support.  Today's reaction to CT scan results seems both appropriate and manageable by patient given the resources available for him in the community.  Denies suicidality although "dont all cancer patients think about that once in a while?"  CSW provided support and validation of normal reaction to new evidence of disease.  Encouraged participation in additional support resources as desired (reviewed both GI and Living w Cancer support groups).  PAtient will have difficulty accessing groups due to distance he commutes daily for work.  Will provide resources specific to supporting adolescent children w parent diagnosed w cancer.  Gave my card and encouraged him to call as needed for support/resources.  Edwyna Shell, LCSW Clinical Social Worker Phone:  [phone]

## 2017-10-12 ENCOUNTER — Telehealth: Payer: Self-pay | Admitting: Hematology

## 2017-10-12 NOTE — Telephone Encounter (Signed)
IB message to [facility] regarding f/u appointment for 6/13/  Also added to list for Ginette Otto for infusion? Per 5/30 los

## 2017-10-12 NOTE — Telephone Encounter (Signed)
Appointments scheduled pateint to pick up new schedule at next appointment per 5/30 los

## 2017-10-13 ENCOUNTER — Inpatient Hospital Stay: Payer: BLUE CROSS/BLUE SHIELD | Attending: Hematology

## 2017-10-13 ENCOUNTER — Encounter: Payer: Self-pay | Admitting: Nurse Practitioner

## 2017-10-13 VITALS — BP 121/94 | HR 67 | Temp 98.2°F | Resp 18

## 2017-10-13 DIAGNOSIS — D696 Thrombocytopenia, unspecified: Secondary | ICD-10-CM | POA: Insufficient documentation

## 2017-10-13 DIAGNOSIS — G629 Polyneuropathy, unspecified: Secondary | ICD-10-CM | POA: Diagnosis not present

## 2017-10-13 DIAGNOSIS — C787 Secondary malignant neoplasm of liver and intrahepatic bile duct: Secondary | ICD-10-CM | POA: Diagnosis not present

## 2017-10-13 DIAGNOSIS — Z5111 Encounter for antineoplastic chemotherapy: Secondary | ICD-10-CM | POA: Insufficient documentation

## 2017-10-13 DIAGNOSIS — C7800 Secondary malignant neoplasm of unspecified lung: Secondary | ICD-10-CM | POA: Insufficient documentation

## 2017-10-13 DIAGNOSIS — C189 Malignant neoplasm of colon, unspecified: Secondary | ICD-10-CM

## 2017-10-13 DIAGNOSIS — Z79899 Other long term (current) drug therapy: Secondary | ICD-10-CM | POA: Diagnosis not present

## 2017-10-13 DIAGNOSIS — D500 Iron deficiency anemia secondary to blood loss (chronic): Secondary | ICD-10-CM | POA: Diagnosis not present

## 2017-10-13 DIAGNOSIS — C771 Secondary and unspecified malignant neoplasm of intrathoracic lymph nodes: Secondary | ICD-10-CM | POA: Insufficient documentation

## 2017-10-13 DIAGNOSIS — C182 Malignant neoplasm of ascending colon: Secondary | ICD-10-CM | POA: Insufficient documentation

## 2017-10-13 MED ORDER — SODIUM CHLORIDE 0.9% FLUSH
10.0000 mL | INTRAVENOUS | Status: DC | PRN
  Administered 2017-10-13: 10 mL
  Filled 2017-10-13: qty 10

## 2017-10-13 MED ORDER — HEPARIN SOD (PORK) LOCK FLUSH 100 UNIT/ML IV SOLN
500.0000 [IU] | Freq: Once | INTRAVENOUS | Status: AC | PRN
  Administered 2017-10-13: 500 [IU]
  Filled 2017-10-13: qty 5

## 2017-10-17 ENCOUNTER — Telehealth: Payer: Self-pay | Admitting: Hematology

## 2017-10-17 NOTE — Telephone Encounter (Signed)
Left message for patient regarding appointment added to his current schedule per 5/30 los

## 2017-10-25 ENCOUNTER — Inpatient Hospital Stay: Payer: BLUE CROSS/BLUE SHIELD

## 2017-10-25 ENCOUNTER — Encounter: Payer: Self-pay | Admitting: Nurse Practitioner

## 2017-10-25 ENCOUNTER — Telehealth: Payer: Self-pay | Admitting: Nurse Practitioner

## 2017-10-25 ENCOUNTER — Inpatient Hospital Stay (HOSPITAL_BASED_OUTPATIENT_CLINIC_OR_DEPARTMENT_OTHER): Payer: BLUE CROSS/BLUE SHIELD | Admitting: Nurse Practitioner

## 2017-10-25 VITALS — BP 129/97 | HR 66 | Temp 98.0°F | Resp 18 | Ht 71.0 in | Wt 191.5 lb

## 2017-10-25 DIAGNOSIS — G629 Polyneuropathy, unspecified: Secondary | ICD-10-CM

## 2017-10-25 DIAGNOSIS — D500 Iron deficiency anemia secondary to blood loss (chronic): Secondary | ICD-10-CM

## 2017-10-25 DIAGNOSIS — C7800 Secondary malignant neoplasm of unspecified lung: Secondary | ICD-10-CM

## 2017-10-25 DIAGNOSIS — R110 Nausea: Secondary | ICD-10-CM

## 2017-10-25 DIAGNOSIS — C189 Malignant neoplasm of colon, unspecified: Secondary | ICD-10-CM

## 2017-10-25 DIAGNOSIS — I10 Essential (primary) hypertension: Secondary | ICD-10-CM

## 2017-10-25 DIAGNOSIS — C787 Secondary malignant neoplasm of liver and intrahepatic bile duct: Secondary | ICD-10-CM

## 2017-10-25 DIAGNOSIS — T451X5A Adverse effect of antineoplastic and immunosuppressive drugs, initial encounter: Secondary | ICD-10-CM

## 2017-10-25 DIAGNOSIS — G620 Drug-induced polyneuropathy: Secondary | ICD-10-CM

## 2017-10-25 DIAGNOSIS — Z79899 Other long term (current) drug therapy: Secondary | ICD-10-CM

## 2017-10-25 DIAGNOSIS — C182 Malignant neoplasm of ascending colon: Secondary | ICD-10-CM | POA: Diagnosis not present

## 2017-10-25 DIAGNOSIS — C771 Secondary and unspecified malignant neoplasm of intrathoracic lymph nodes: Secondary | ICD-10-CM | POA: Diagnosis not present

## 2017-10-25 DIAGNOSIS — Z95828 Presence of other vascular implants and grafts: Secondary | ICD-10-CM

## 2017-10-25 DIAGNOSIS — D696 Thrombocytopenia, unspecified: Secondary | ICD-10-CM | POA: Diagnosis not present

## 2017-10-25 LAB — COMPREHENSIVE METABOLIC PANEL
ALBUMIN: 4 g/dL (ref 3.5–5.0)
ALT: 18 U/L (ref 0–55)
AST: 18 U/L (ref 5–34)
Alkaline Phosphatase: 50 U/L (ref 40–150)
Anion gap: 7 (ref 3–11)
BUN: 11 mg/dL (ref 7–26)
CHLORIDE: 106 mmol/L (ref 98–109)
CO2: 27 mmol/L (ref 22–29)
Calcium: 9.4 mg/dL (ref 8.4–10.4)
Creatinine, Ser: 0.94 mg/dL (ref 0.70–1.30)
GFR calc Af Amer: >60 mL/min (ref >60)
GLUCOSE: 82 mg/dL (ref 70–140)
POTASSIUM: 4.6 mmol/L (ref 3.5–5.1)
Sodium: 140 mmol/L (ref 136–145)
Total Bilirubin: 0.4 mg/dL (ref 0.2–1.2)
Total Protein: 6.9 g/dL (ref 6.4–8.3)

## 2017-10-25 LAB — CBC WITH DIFFERENTIAL/PLATELET
BASOS ABS: 0 10*3/uL (ref 0.0–0.1)
BASOS PCT: 1 %
EOS PCT: 2 %
Eosinophils Absolute: 0.1 10*3/uL (ref 0.0–0.5)
HCT: 39.8 % (ref 38.4–49.9)
Hemoglobin: 13.5 g/dL (ref 13.0–17.1)
Lymphocytes Relative: 25 %
Lymphs Abs: 0.9 10*3/uL (ref 0.9–3.3)
MCH: 33.5 pg — ABNORMAL HIGH (ref 27.2–33.4)
MCHC: 33.9 g/dL (ref 32.0–36.0)
MCV: 98.8 fL — ABNORMAL HIGH (ref 79.3–98.0)
MONO ABS: 0.4 10*3/uL (ref 0.1–0.9)
Monocytes Relative: 12 %
Neutro Abs: 2.1 10*3/uL (ref 1.5–6.5)
Neutrophils Relative %: 60 %
PLATELETS: 142 10*3/uL (ref 140–400)
RBC: 4.03 MIL/uL — ABNORMAL LOW (ref 4.20–5.82)
RDW: 15.6 % — AB (ref 11.0–14.6)
WBC: 3.4 10*3/uL — AB (ref 4.0–10.3)

## 2017-10-25 LAB — CEA (IN HOUSE-CHCC): CEA (CHCC-In House): 5.26 ng/mL — ABNORMAL HIGH (ref 0.00–5.00)

## 2017-10-25 LAB — TOTAL PROTEIN, URINE DIPSTICK: PROTEIN: NEGATIVE mg/dL

## 2017-10-25 MED ORDER — DEXTROSE 5 % IV SOLN: Freq: Once | INTRAVENOUS | Status: DC

## 2017-10-25 MED ORDER — HEPARIN SOD (PORK) LOCK FLUSH 100 UNIT/ML IV SOLN
500.0000 [IU] | Freq: Once | INTRAVENOUS | Status: DC | PRN
  Filled 2017-10-25: qty 5

## 2017-10-25 MED ORDER — PALONOSETRON HCL INJECTION 0.25 MG/5ML
0.2500 mg | Freq: Once | INTRAVENOUS | Status: AC
  Administered 2017-10-25: 0.25 mg via INTRAVENOUS

## 2017-10-25 MED ORDER — SODIUM CHLORIDE 0.9 % IV SOLN
Freq: Once | INTRAVENOUS | Status: AC
  Administered 2017-10-25: 12:00:00 via INTRAVENOUS
  Filled 2017-10-25: qty 5

## 2017-10-25 MED ORDER — PALONOSETRON HCL INJECTION 0.25 MG/5ML
INTRAVENOUS | Status: AC
  Filled 2017-10-25: qty 5

## 2017-10-25 MED ORDER — DIPHENOXYLATE-ATROPINE 2.5-0.025 MG PO TABS
ORAL_TABLET | ORAL | Status: AC
  Filled 2017-10-25: qty 2

## 2017-10-25 MED ORDER — ATROPINE SULFATE 1 MG/ML IJ SOLN
INTRAMUSCULAR | Status: AC
  Filled 2017-10-25: qty 1

## 2017-10-25 MED ORDER — SODIUM CHLORIDE 0.9% FLUSH
10.0000 mL | INTRAVENOUS | Status: DC | PRN
  Filled 2017-10-25: qty 10

## 2017-10-25 MED ORDER — SODIUM CHLORIDE 0.9 % IV SOLN
2200.0000 mg/m2 | INTRAVENOUS | Status: DC
  Administered 2017-10-25: 4600 mg via INTRAVENOUS
  Filled 2017-10-25: qty 92

## 2017-10-25 MED ORDER — SODIUM CHLORIDE 0.9 % IV SOLN
5.0000 mg/kg | Freq: Once | INTRAVENOUS | Status: AC
  Administered 2017-10-25: 450 mg via INTRAVENOUS
  Filled 2017-10-25: qty 16

## 2017-10-25 MED ORDER — DEXTROSE 5 % IV SOLN
400.0000 mg/m2 | Freq: Once | INTRAVENOUS | Status: AC
  Administered 2017-10-25: 840 mg via INTRAVENOUS
  Filled 2017-10-25: qty 42

## 2017-10-25 MED ORDER — SODIUM CHLORIDE 0.9 % IV SOLN
Freq: Once | INTRAVENOUS | Status: AC
Start: 2017-10-25 — End: 2017-10-25
  Administered 2017-10-25: 12:00:00 via INTRAVENOUS

## 2017-10-25 MED ORDER — SODIUM CHLORIDE 0.9% FLUSH
10.0000 mL | Freq: Once | INTRAVENOUS | Status: AC
  Administered 2017-10-25: 10 mL
  Filled 2017-10-25: qty 10

## 2017-10-25 MED ORDER — IRINOTECAN HCL CHEMO INJECTION 100 MG/5ML
160.0000 mg/m2 | Freq: Once | INTRAVENOUS | Status: AC
  Administered 2017-10-25: 340 mg via INTRAVENOUS
  Filled 2017-10-25: qty 17

## 2017-10-25 MED ORDER — ATROPINE SULFATE 1 MG/ML IJ SOLN
0.5000 mg | Freq: Once | INTRAMUSCULAR | Status: AC
  Administered 2017-10-25: 0.5 mg via INTRAVENOUS

## 2017-10-25 NOTE — Patient Instructions (Signed)
[facility] Discharge Instructions for Patients Receiving Chemotherapy  Today you received the following chemotherapy agents: Avastin, Leucovorin, Irinotecan, 5FU   To help prevent nausea and vomiting after your treatment, we encourage you to take your nausea medication as directed.    If you develop nausea and vomiting that is not controlled by your nausea medication, call the clinic.   BELOW ARE SYMPTOMS THAT SHOULD BE REPORTED IMMEDIATELY:  *FEVER GREATER THAN 100.5 F  *CHILLS WITH OR WITHOUT FEVER  NAUSEA AND VOMITING THAT IS NOT CONTROLLED WITH YOUR NAUSEA MEDICATION  *UNUSUAL SHORTNESS OF BREATH  *UNUSUAL BRUISING OR BLEEDING  TENDERNESS IN MOUTH AND THROAT WITH OR WITHOUT PRESENCE OF ULCERS  *URINARY PROBLEMS  *BOWEL PROBLEMS  UNUSUAL RASH Items with * indicate a potential emergency and should be followed up as soon as possible.  Feel free to call the clinic should you have any questions or concerns. The clinic phone number is [phone].  Please show the CHEMO ALERT CARD at check-in to the Emergency Department and triage nurse.   

## 2017-10-25 NOTE — Telephone Encounter (Signed)
No LOS 6/13

## 2017-10-25 NOTE — Progress Notes (Signed)
[place]  [phone] [phone]  Clinic Follow up Note   Patient Care Team: Curlene Labrum, MD as PCP - General (Family Medicine) 10/25/2017  SUMMARY OF ONCOLOGIC HISTORY: Oncology History   Cancer Staging Metastatic colon cancer to liver [facility]) Staging form: Colon and Rectum, AJCC 8th Edition - Clinical stage from 06/01/2016: Stage IVA (cTX, cNX, pM1a) - Signed by Truitt Merle, MD on 07/04/2016 - Pathologic stage from 06/14/2016: Stage IVA (pT4b(m), pN2b, pM1a) - Signed by Truitt Merle, MD on 07/04/2016       Metastatic colon cancer to liver ([place])   04/2015 Procedure    Colonoscopy by Dr. Ladona Horns. It showed showed 2 sessile polyps ready between 3-5 mm in size located 20 cm (A, B) from the point of entry, polypectomy was performed. Pedunculated polyp was found in the ascending colon (C), polypectomy was performed, and additional polyp (D) was found 30 cm from the point of entry, removed      04/2015 Pathology Results    tubular adenoma (A and B), and well differentiated adenocarcinoma arising from tubulovillous adenoma (C) and well differentiated adenocarcinoma arising from severe dysplasia to intramucosal carcinoma within tubular adenoma.       04/2015 Initial Diagnosis    Metastatic colon cancer to liver ([place])      05/29/2016 Imaging    CT abdomen and pelvis with contrast showed an apple core like stricture in right colon just above the cecum, measuring 3.2 cm in lengths. This is highly suspicious for malignancy. Small lymph node a noticed he had adjacent mesentery, measuring 8 mm. There is a low-density lesion within the inferior aspect of the right hepatic lobe measuring 1.7 cm, suspicious for metastasis.       06/06/2016 Tumor Marker    CEA 9.99      06/08/2016 PET scan    IMPRESSION: Approximately 3 cm hypermetabolic mass in the ascending colon, consistent with primary colon carcinoma. This mass results in colonic obstruction and small  bowel dilatation. Additional areas of hypermetabolic wall thickening in the cecum may represent other sites of colon carcinoma or colitis. Mild hypermetabolic lymphadenopathy in right pericolonic region, porta hepatis, and aortocaval space, consistent with metastatic disease. Mild hypermetabolic mediastinal lymphadenopathy also seen, and thoracic lymph node metastases cannot be excluded. Solitary hypermetabolic focus in inferior right hepatic lobe, consistent with liver metastasis. Consider abdomen MRI without and with contrast for further evaluation.      06/14/2016 Surgery    Hand assisted right hemicolectomy and small bowel resection for colon cancer, liver biopsy, by Dr. Barry Dienes      06/14/2016 Pathology Results    Right hemicolectomy showed invasive well to moderately differentiated adenocarcinoma, 2 foci measuring 7.5 cm and 4.5 cm, tumor invades through full thickness of colon, to the seroma and involve the Small Bowel, Surgical Margins Were Negative, 24 Out Of 64 Lymph Nodes Were Positive, Extracapsular Extension Identified, Multiple Satellite Tumor Deposits Present, Liver Biopsy Showed Metastatic Adenocarcinoma.        06/14/2016 Miscellaneous    Tumor MMR normal, MSI stable       06/14/2016 Miscellaneous    Foundation one genomic testing showed K-ras G12 D mutation, APC and TP53 mutation. No BRAF and NRAS mutation. MSI-stable, tumor burden low.      07/06/2016 Tumor Marker    CEA 13.69      07/13/2016 - 01/18/2017 Chemotherapy    mFOLFOX, every 2 weeks, started on 07/14/2015, Avastin added from cycle 3  Oxaliplatin dose to  $'60mg'S$ /m2 due to side effects and some cytopenia on 09/21/16  Changed to FOLFIRINOX starting cycle 7 and Reduced  Due to neuropathy hold Oxaliplatin and add Irinotecan with neulasta on day 3 starting with cycle 7 Add low dose Oxaliplatin with cycle 8.  Due to his worsening neuropathy, and good response to chemotherapy, I previously stopped oxaliplatin from cycle 11,  and continue FOLFIRI and avastin         07/27/2016 Tumor Marker    CEA 15.85      09/04/2016 Imaging    Ct C/A/P W Contrast IMPRESSION: Interval right colectomy. Stable small liver metastasis in the inferior right hepatic lobe. Stable mild porta hepatis and aortocaval lymphadenopathy. Stable mild mediastinal lymphadenopathy. No new or progressive metastatic disease identified within the chest, abdomen, or pelvis.      12/19/2016 PET scan    IMPRESSION: 1. Right hemicolectomy, with resolution of the prior hypermetabolic activity inferiorly in the right hepatic lobe, in several mediastinal lymph nodes, and in lymph nodes in the retroperitoneum and porta hepatis. No residual hypermetabolic or enlarged lymph nodes are identified. 2. Low-grade diffuse skeletal metabolic activity is likely therapy related. 3. Coronary atherosclerosis. 4. 3 by 4 mm right middle lobe pulmonary nodule is stable, not appreciably hypermetabolic, but below sensitive PET-CT size thresholds. This may warrant surveillance.      01/11/2017 Imaging    MR Abdomen W WO Contrast IMPRESSION: 1. No acute findings within the abdomen. Previously noted liver metastasis has resolved in the interval. No new lesions.      02/01/2017 -  Chemotherapy    Maintenance therapy, Xeloda '2000mg'$  (1000 mg/m2)  q12h on day 1-14 every 21 days plus AVASTIN, starting 02/01/2017.  stopped after 12 days due to poor tolerance on 02/14/17  Changed to maintenance 5-FU and avastin every 2 weeks starting on 02/22/17        04/10/2017 Imaging    IMPRESSION: 1. Status post right hemicolectomy without findings for recurrent tumor. 2. No worrisome hepatic lesions. Treated disease with only a small residual low attenuation lesion in the right hepatic lobe. 3. No recurrent mediastinal or abdominal lymphadenopathy.       07/09/2017 PET scan    PET 07/09/17 IMPRESSION: 1. Status post right hemicolectomy, without findings of hypermetabolic  recurrent or metastatic disease. 2. A right middle lobe pulmonary nodule is unchanged. However, there is a right lower lobe 5 mm pulmonary nodule which is felt to be new and enlarged compared to prior exams. Suspicious for an isolated pulmonary metastasis. Consider CT follow-up at 3-6 months. 3. Age advanced coronary artery atherosclerosis. Recommend assessment of coronary risk factors and consideration of medical therapy. 4. Borderline ascending aortic dilatation, 4.0 cm        10/10/2017 Imaging    IMPRESSION: 1. Several (at least 10) subcentimeter pulmonary nodules scattered in both lungs, predominantly in the lower lobes, all new/increased, most compatible with enlarging pulmonary metastases, largest 8 mm in the right lower lobe. 2. No additional findings of new or progressive metastatic disease. No recurrent adenopathy. Stable small low-attenuation lesion in the inferior right liver lobe compatible with treated metastasis. No new liver metastases. 3. Stable ectatic 4.0 cm ascending thoracic aorta. Recommend annual imaging followup by CTA or MRA.  4. Stable mild splenomegaly.      CURRENT THERAPY: Maintenance 5-Fu and Avastin started 01/04/2017, he tried one cycle Xeloda to replace 5-fu, but could not tolerate. Added leucovorin and decreased 5-Fu 10% starting on 03/15/17.   INTERVAL HISTORY: Mr. Steppe  returns with his wife Nicholas Lowery for cycle 2 FOLFIRI/avastin as scheduled. He completed cycle 1 on 10/11/17. He tolerated moderately well with mild nausea without emesis. He reports feeling "queasy" consistently for the first week after chemo with low po intake. Nausea improves but does not resolve. He tried decadron for a couple days last cycle but can't confirm it was helpful. He does loose some weight but gains most of it it back when his appetite returns on the second week. Had some abdominal cramping but denies diarrhea. He is more aware of neuropathy and "pressure" in his feet since  restarting irinotecan. No sensitivity. Has to wear shoes for stability. Still able to button clothes, hold things, turn pages, etc without difficulty. Takes B complex but don't feel it is extremely helpful.   REVIEW OF SYSTEMS:   Constitutional: Denies fevers, chills (+) weight loss (+) low appetite and po intake 1 week after chemo Eyes: Denies blurriness of vision Ears, nose, mouth, throat, and face: Denies mucositis or sore throat Respiratory: Denies cough, dyspnea or wheezes Cardiovascular: Denies palpitation, chest discomfort or lower extremity swelling Gastrointestinal:  Denies vomiting, constipation, diarrhea, heartburn or change in bowel habits (+) mild nausea intermittently between chemo cycles, does not completely resolve (+) abdominal cramping  Skin: Denies abnormal skin rashes Lymphatics: Denies new lymphadenopathy or easy bruising Neurological:Denies new weaknesses (+) neuropathy "pressure" in feet >hands  Behavioral/Psych: Mood is stable, no new changes (+) depressed  All other systems were reviewed with the patient and are negative.  MEDICAL HISTORY:  Past Medical History:  Diagnosis Date  . Anxiety   . Cancer of ascending colon ([place])   . Seasonal allergies     SURGICAL HISTORY: Past Surgical History:  Procedure Laterality Date  . APPENDECTOMY  1992  . CATARACT EXTRACTION W/ INTRAOCULAR LENS IMPLANT Left 04/2016  . COLON SURGERY    . COLONOSCOPY W/ BIOPSIES AND POLYPECTOMY  04/2015  . INGUINAL HERNIA REPAIR Right 1982  . IR RADIOLOGIST EVAL & MGMT  01/16/2017  . KNEE ARTHROSCOPY W/ MENISCECTOMY Right 1998  . LAPAROSCOPIC RIGHT COLECTOMY N/A 06/14/2016   Procedure: LAPAROSCOPIC HAND ASSISTED HEMICOLECTOMY AND SMALL BOWEL RESECTION.;  Surgeon: [name], MD;  Location: [place];  Service: General;  Laterality: N/A;  . LIVER BIOPSY Right 06/14/2016   Procedure: LIVER BIOPSY;  Surgeon: [name], MD;  Location: [place];  Service: General;  Laterality: Right;  Right Inferior  Liver  . PORTACATH PLACEMENT N/A 07/06/2016   Procedure: INSERTION PORT-A-CATH;  Surgeon: [name], MD;  Location: [place];  Service: General;  Laterality: N/A;  . VASECTOMY  2005    I have reviewed the social history and family history with the patient and they are unchanged from previous note.  ALLERGIES:  is allergic to penicillins and xeloda [capecitabine].  MEDICATIONS:  Current Outpatient Medications  Medication Sig Dispense Refill  . amLODipine (NORVASC) 10 MG tablet Take 1 tablet (10 mg total) by mouth daily. 30 tablet 2  . celecoxib (CELEBREX) 200 MG capsule Take 1 capsule (200 mg total) by mouth daily. 60 capsule 1  . dexamethasone (DECADRON) 4 MG tablet Up to 5 days after chemo (begin day 2) 20 tablet 0  . diphenoxylate-atropine (LOMOTIL) 2.5-0.025 MG tablet Take 2 tablets by mouth 4 (four) times daily as needed for diarrhea or loose stools. 30 tablet 0  . lidocaine-prilocaine (EMLA) cream Apply to affected area once 30 g 3  . lisinopril (PRINIVIL) 10 MG tablet Take 1 tablet (10 mg  total) by mouth daily. 90 tablet 2  . LORazepam (ATIVAN) 0.5 MG tablet Take 1 tablet (0.5 mg total) by mouth every 8 (eight) hours as needed (for nausea). 30 tablet 0  . Multiple Vitamin (MULTIVITAMIN WITH MINERALS) TABS tablet Take 1 tablet by mouth daily.    . ondansetron (ZOFRAN ODT) 8 MG disintegrating tablet Take 1 tablet (8 mg total) by mouth every 8 (eight) hours as needed for nausea or vomiting. 30 tablet 2  . prochlorperazine (COMPAZINE) 10 MG tablet Take 1 tablet (10 mg total) by mouth every 6 (six) hours as needed for nausea or vomiting. 30 tablet 3  . traMADol (ULTRAM) 50 MG tablet Take by mouth 2 (two) times daily.    . traMADol (ULTRAM) 50 MG tablet Take 1 tablet (50 mg total) by mouth every 6 (six) hours as needed. 30 tablet 0  . urea (CARMOL) 10 % cream Apply topically 3 (three) times daily. 71 g 1  . venlafaxine XR (EFFEXOR-XR) 75 MG 24 hr capsule Take 1 capsule  (75 mg total) by mouth daily with breakfast. 30 capsule 1   No current facility-administered medications for this visit.    Facility-Administered Medications Ordered in Other Visits  Medication Dose Route Frequency Provider Last Rate Last Dose  . clindamycin (CLEOCIN) 900 mg in dextrose 5 % 50 mL IVPB  900 mg Intravenous 60 min Pre-Op [name], MD       And  . gentamicin (GARAMYCIN) 450 mg in dextrose 5 % 50 mL IVPB  5 mg/kg Intravenous 60 min Pre-Op [name], MD      . dexamethasone (DECADRON) injection 10 mg  10 mg Intravenous Once Harle Stanford., PA-C      . diphenoxylate-atropine (LOMOTIL) 2.5-0.025 MG per tablet 2 tablet  2 tablet Oral Once Harle Stanford., PA-C      . heparin lock flush 100 unit/mL  500 Units Intracatheter Once PRN Truitt Merle, MD      . sodium chloride flush (NS) 0.9 % injection 10 mL  10 mL Intracatheter PRN Truitt Merle, MD        PHYSICAL EXAMINATION: ECOG PERFORMANCE STATUS: 1 - Symptomatic but completely ambulatory  Vitals:   10/25/17 1050  BP: (!) 129/97  Pulse: 66  Resp: 18  Temp: 98 F (36.7 C)  SpO2: 100%   Filed Weights   10/25/17 1050  Weight: 191 lb 8 oz (86.9 kg)    GENERAL:alert, no distress and comfortable SKIN: no rashes or significant lesions.  EYES: normal, Conjunctiva are pink and non-injected, sclera clear OROPHARYNX:no thrush or ulcers  LYMPH:  no palpable cervical or supraclavicular lymphadenopathy  LUNGS: clear to auscultation with normal breathing effort HEART: regular rate & rhythm and no murmurs and no lower extremity edema ABDOMEN:abdomen soft, non-tender and normal bowel sounds Musculoskeletal:no cyanosis of digits and no clubbing  NEURO: alert & oriented x 3 with fluent speech PAC without erythema   LABORATORY DATA:  I have reviewed the data as listed CBC Latest Ref Rng & Units 10/25/2017 10/11/2017 09/27/2017  WBC 4.0 - 10.3 K/uL 3.4(L) 4.2 4.3  Hemoglobin 13.0 - 17.1 g/dL 13.5 13.6 13.7  Hematocrit 38.4 - 49.9 %  39.8 39.8 40.5  Platelets 140 - 400 K/uL 142 151 119(L)     CMP Latest Ref Rng & Units 10/25/2017 10/11/2017 09/27/2017  Glucose 70 - 140 mg/dL 82 84 128  BUN 7 - 26 mg/dL '11 11 11  '$ Creatinine 0.70 - 1.30 mg/dL 0.94 1.03 1.16  Sodium 136 - 145 mmol/L 140 139 138  Potassium 3.5 - 5.1 mmol/L 4.6 4.5 4.1  Chloride 98 - 109 mmol/L 106 104 104  CO2 22 - 29 mmol/L '27 26 25  '$ Calcium 8.4 - 10.4 mg/dL 9.4 9.1 9.1  Total Protein 6.4 - 8.3 g/dL 6.9 7.1 7.1  Total Bilirubin 0.2 - 1.2 mg/dL 0.4 0.8 0.6  Alkaline Phos 40 - 150 U/L 50 50 52  AST 5 - 34 U/L '18 20 25  '$ ALT 0 - 55 U/L '18 18 20   '$ PATHOLOGY   Diagnosis 06/14/2016 1. Colon, segmental resection for tumor, Right Ascending Hemicolectomy and Small Bowel - INVASIVE WELL TO MODERATELY DIFFERENTIATED ADENOCARCINOMA. - TWO TUMOR FOCI MEASURING 7.5 CM AND 4.5 CM IN GREATEST DIMENSION. - TUMOR INVADES THROUGH FULL THICKNESS OF COLON, THROUGH THE SEROSA TO INVOLVE THE SMALL BOWEL. - MARGINS ARE NEGATIVE. - TWENTY FOUR OF SIXTY FOUR LYMPH NODES POSITIVE FOR METASTATIC ADENOCARCINOMA (24/64). - EXTRACAPSULAR EXTENSION IDENTIFIED - MULTIPLE SATELLITE TUMOR DEPOSITS PRESENT. - SEE ONCOLOGY TEMPLATE. 2. Liver, needle/core biopsy, Right Inferior - POSITIVE FOR METASTATIC ADENOCARCINOMA.        RADIOGRAPHIC STUDIES: I have personally reviewed the radiological images as listed and agreed with the findings in the report. No results found.   ASSESSMENT & PLAN: 57 y.o.Caucasian male, without significant past medical history, presented with rectal bleeding in December 2016, colonoscopy showed 4 polyps, 2 of them showed invasive adenocarcinoma, unfortunately he was not aware of the pathology findings and was not treated. He now presented with fatigue, weight loss, anorexia and abdominal pain  1. Right colon cancer with liver and node metastasis, [id number], stage IV, MSI-stable, KRAS G12D mutation (+) -Nicholas Lowery appears stable. He completed  first cycle since restarting FOLFIRI and continues avastin. He tolerated moderately well with recurrent nausea and anorexia. He lost weight during the first week after chemotherapy, his appetite returns the week before treatment.  -Labs reviewed, CBC and CMP stable and adequate to proceed with next cycle FOLFIRI/avastin. CEA up slightly to 5.26.  -Proceed with next cycle FOLFIRI/avastin today, return in 3 weeks after family trip for third cycle.  2. Depression, anxiety -mood is appropriate for situation. He has spoken to [employer], LCSW. Has strong family support. Mood is better on effexor.   3. HTN -likely secondary to avastin. On lisinopril and amlodipine. No proteinuria.   4. Anemia of iron deficiency and related to chemotherapy  -Hgb and iron studies are adequate lately.   5. Anorexia and weight loss, secondary to chemo  -worse since restarting irinotecan. He gets decadron, emend, and aloxi premeds.  -He often skips lunch. I encouraged him to eat regular frequent meals and not skip. I encouraged him to add carnation drink as a supplement rather than a meal replacement. He declined marinol, will not use remeron due to being on other current antidepressants.  -He will take decadron consistently daily x5 days after chemo for nausea and appetite  -I encouraged him to take anti-emetics 30-60 mins before mealtime to promote po intake.   6. Peripheral neuropathy, G1 -More noticeable since restarting irinotecan, but overall improved from when he was on oxaliplatin. He continues to function normally. Will monitor closely.  -he does not find B complex to be helpful, I suggested cryotherapy during chemotherapy infusions   7. Goals of care discussions  -he understands the goal of therapy is palliative, to control disease, prolong his life, and improve quality of life.   PLAN: -Labs reviewed, proceed with  next cycle (2nd) FOLFIRI and continue avastin  -Lab, f/u, FOLFIRI/avastin in 3 weeks  (delayed due to family trip) -Decadron 1 tab daily x5 days after chemo, begin on day 2, for decreased appetite and nausea  -Take anti-emetics 30-60 mins before mealtime to promote po intake -Increase carnation breakfast/nutrition supplement during first week when appetite is low -Cryotherapy during infusion for neuropathy   All questions were answered. The patient knows to call the clinic with any problems, questions or concerns. No barriers to learning was detected. I spent 20 minutes counseling the patient face to face. The total time spent in the appointment was 25 minutes and more than 50% was on counseling and review of test results     Alla Feeling, NP 10/25/17

## 2017-10-26 ENCOUNTER — Ambulatory Visit: Payer: BLUE CROSS/BLUE SHIELD | Admitting: Nurse Practitioner

## 2017-10-27 ENCOUNTER — Inpatient Hospital Stay: Payer: BLUE CROSS/BLUE SHIELD

## 2017-10-27 VITALS — BP 130/89 | HR 86 | Temp 98.0°F | Resp 18

## 2017-10-27 DIAGNOSIS — C182 Malignant neoplasm of ascending colon: Secondary | ICD-10-CM | POA: Diagnosis not present

## 2017-10-27 DIAGNOSIS — C189 Malignant neoplasm of colon, unspecified: Secondary | ICD-10-CM

## 2017-10-27 DIAGNOSIS — C787 Secondary malignant neoplasm of liver and intrahepatic bile duct: Principal | ICD-10-CM

## 2017-10-27 MED ORDER — HEPARIN SOD (PORK) LOCK FLUSH 100 UNIT/ML IV SOLN
500.0000 [IU] | Freq: Once | INTRAVENOUS | Status: AC | PRN
Start: 2017-10-27 — End: 2017-10-27
  Administered 2017-10-27: 500 [IU]
  Filled 2017-10-27: qty 5

## 2017-10-27 MED ORDER — SODIUM CHLORIDE 0.9% FLUSH
10.0000 mL | INTRAVENOUS | Status: DC | PRN
  Administered 2017-10-27: 10 mL
  Filled 2017-10-27: qty 10

## 2017-11-01 ENCOUNTER — Encounter: Payer: Self-pay | Admitting: Nurse Practitioner

## 2017-11-13 ENCOUNTER — Other Ambulatory Visit: Payer: Self-pay | Admitting: Hematology

## 2017-11-13 DIAGNOSIS — C787 Secondary malignant neoplasm of liver and intrahepatic bile duct: Principal | ICD-10-CM

## 2017-11-13 DIAGNOSIS — C189 Malignant neoplasm of colon, unspecified: Secondary | ICD-10-CM

## 2017-11-13 NOTE — Progress Notes (Signed)
[place]  [phone] [phone]  Clinic Follow Up Note  Patient Care Team: Curlene Labrum, MD as PCP - General (Family Medicine)   Date of Service:  11/14/2017   CHIEF COMPLAINTS:  Follow up metastatic right colon cancer   Oncology History   Cancer Staging Metastatic colon cancer to liver [facility]) Staging form: Colon and Rectum, AJCC 8th Edition - Clinical stage from 06/01/2016: Stage IVA (cTX, cNX, pM1a) - Signed by Truitt Merle, MD on 07/04/2016 - Pathologic stage from 06/14/2016: Stage IVA (pT4b(m), pN2b, pM1a) - Signed by Truitt Merle, MD on 07/04/2016       Metastatic colon cancer to liver ([place])   04/2015 Procedure    Colonoscopy by Dr. Ladona Horns. It showed showed 2 sessile polyps ready between 3-5 mm in size located 20 cm (A, B) from the point of entry, polypectomy was performed. Pedunculated polyp was found in the ascending colon (C), polypectomy was performed, and additional polyp (D) was found 30 cm from the point of entry, removed      04/2015 Pathology Results    tubular adenoma (A and B), and well differentiated adenocarcinoma arising from tubulovillous adenoma (C) and well differentiated adenocarcinoma arising from severe dysplasia to intramucosal carcinoma within tubular adenoma.       04/2015 Initial Diagnosis    Metastatic colon cancer to liver ([place])      05/29/2016 Imaging    CT abdomen and pelvis with contrast showed an apple core like stricture in right colon just above the cecum, measuring 3.2 cm in lengths. This is highly suspicious for malignancy. Small lymph node a noticed he had adjacent mesentery, measuring 8 mm. There is a low-density lesion within the inferior aspect of the right hepatic lobe measuring 1.7 cm, suspicious for metastasis.       06/06/2016 Tumor Marker    CEA 9.99      06/08/2016 PET scan    IMPRESSION: Approximately 3 cm hypermetabolic mass in the ascending colon, consistent with primary colon carcinoma.  This mass results in colonic obstruction and small bowel dilatation. Additional areas of hypermetabolic wall thickening in the cecum may represent other sites of colon carcinoma or colitis. Mild hypermetabolic lymphadenopathy in right pericolonic region, porta hepatis, and aortocaval space, consistent with metastatic disease. Mild hypermetabolic mediastinal lymphadenopathy also seen, and thoracic lymph node metastases cannot be excluded. Solitary hypermetabolic focus in inferior right hepatic lobe, consistent with liver metastasis. Consider abdomen MRI without and with contrast for further evaluation.      06/14/2016 Surgery    Hand assisted right hemicolectomy and small bowel resection for colon cancer, liver biopsy, by Dr. Barry Dienes      06/14/2016 Pathology Results    Right hemicolectomy showed invasive well to moderately differentiated adenocarcinoma, 2 foci measuring 7.5 cm and 4.5 cm, tumor invades through full thickness of colon, to the seroma and involve the Small Bowel, Surgical Margins Were Negative, 24 Out Of 64 Lymph Nodes Were Positive, Extracapsular Extension Identified, Multiple Satellite Tumor Deposits Present, Liver Biopsy Showed Metastatic Adenocarcinoma.        06/14/2016 Miscellaneous    Tumor MMR normal, MSI stable       06/14/2016 Miscellaneous    Foundation one genomic testing showed K-ras G12 D mutation, APC and TP53 mutation. No BRAF and NRAS mutation. MSI-stable, tumor burden low.      07/06/2016 Tumor Marker    CEA 13.69      07/13/2016 - 01/18/2017 Chemotherapy    mFOLFOX, every 2  weeks, started on 07/14/2015, Avastin added from cycle 3  Oxaliplatin dose to 54m/m2 due to side effects and some cytopenia on 09/21/16  Changed to FOLFIRINOX starting cycle 7 and Reduced  Due to neuropathy hold Oxaliplatin and add Irinotecan with neulasta on day 3 starting with cycle 7 Add low dose Oxaliplatin with cycle 8.  Due to his worsening neuropathy, and good response to  chemotherapy, I previously stopped oxaliplatin from cycle 11, and continue FOLFIRI and avastin         07/27/2016 Tumor Marker    CEA 15.85      09/04/2016 Imaging    Ct C/A/P W Contrast IMPRESSION: Interval right colectomy. Stable small liver metastasis in the inferior right hepatic lobe. Stable mild porta hepatis and aortocaval lymphadenopathy. Stable mild mediastinal lymphadenopathy. No new or progressive metastatic disease identified within the chest, abdomen, or pelvis.      12/19/2016 PET scan    IMPRESSION: 1. Right hemicolectomy, with resolution of the prior hypermetabolic activity inferiorly in the right hepatic lobe, in several mediastinal lymph nodes, and in lymph nodes in the retroperitoneum and porta hepatis. No residual hypermetabolic or enlarged lymph nodes are identified. 2. Low-grade diffuse skeletal metabolic activity is likely therapy related. 3. Coronary atherosclerosis. 4. 3 by 4 mm right middle lobe pulmonary nodule is stable, not appreciably hypermetabolic, but below sensitive PET-CT size thresholds. This may warrant surveillance.      01/11/2017 Imaging    MR Abdomen W WO Contrast IMPRESSION: 1. No acute findings within the abdomen. Previously noted liver metastasis has resolved in the interval. No new lesions.      02/01/2017 -  Chemotherapy    Maintenance therapy, Xeloda 20059m(1000 mg/m2)  q12h on day 1-14 every 21 days plus AVASTIN, starting 02/01/2017.  stopped after 12 days due to poor tolerance on 02/14/17  Changed to maintenance 5-FU and avastin every 2 weeks starting on 02/22/17        04/10/2017 Imaging    IMPRESSION: 1. Status post right hemicolectomy without findings for recurrent tumor. 2. No worrisome hepatic lesions. Treated disease with only a small residual low attenuation lesion in the right hepatic lobe. 3. No recurrent mediastinal or abdominal lymphadenopathy.       07/09/2017 PET scan    PET 07/09/17 IMPRESSION: 1. Status  post right hemicolectomy, without findings of hypermetabolic recurrent or metastatic disease. 2. A right middle lobe pulmonary nodule is unchanged. However, there is a right lower lobe 5 mm pulmonary nodule which is felt to be new and enlarged compared to prior exams. Suspicious for an isolated pulmonary metastasis. Consider CT follow-up at 3-6 months. 3. Age advanced coronary artery atherosclerosis. Recommend assessment of coronary risk factors and consideration of medical therapy. 4. Borderline ascending aortic dilatation, 4.0 cm        10/10/2017 Imaging    IMPRESSION: 1. Several (at least 10) subcentimeter pulmonary nodules scattered in both lungs, predominantly in the lower lobes, all new/increased, most compatible with enlarging pulmonary metastases, largest 8 mm in the right lower lobe. 2. No additional findings of new or progressive metastatic disease. No recurrent adenopathy. Stable small low-attenuation lesion in the inferior right liver lobe compatible with treated metastasis. No new liver metastases. 3. Stable ectatic 4.0 cm ascending thoracic aorta. Recommend annual imaging followup by CTA or MRA.  4. Stable mild splenomegaly.        HISTORY OF PRESENTING ILLNESS (06/01/2016):  ChLawarence Meek51.o. male is here because of his recently abdominal CT which  is highly suspicious for metastatic colon cancer. He is accompanied by his wife to my clinic today. He was referred by his primary care physician Dr. Pleas Koch.   He had colonoscopy in 2016 for mild rectal bleeding, he describe small amount fresh blood mixed with stool, he has no other constitutional symptoms at that time. He was referred to gastroenterologist Dr. Ladona Horns and underwent a colonoscopy in December 2016. The colonoscopy showed 2 sessile polyps ready between 3-5 mm in size located 20 cm (A, B) from the point of entry, polypectomy was performed. Pedunculated polyp was found in the ascending colon (C),  polypectomy was performed, and additional polyp (D) was found 30 cm from the point of entry, removed. The pathology reviewed tubular adenoma (A and B), and well differentiated adenocarcinoma arising from tubulovillous adenoma (C) and well differentiated adenocarcinoma arising from severe dysplasia to intramucosal carcinoma within tubular adenoma. Dr. Tye Maryland tried multiple times to reach patient, but patient sought they were calling him about the bill, and did not return the phone calls. He was not aware the cancer diagnosis until recently.   He started having diarrhea and vomiting in mid Dec 2017, and felt a "pop" in right side abdomen, he was seen at urgent care, and was treated with antiemetics, and IVF, lab tests were OK. Due to his persistent intermittent diarrhea and epigastric pain since then, he was seen by PCP and he eventually had CT abdomen and pelvis scan which showed a upper core lesion in the ascending colon, and I'll 1.7 cm lesion in the liver, highly suspicious for metastasis. He was referred to [place] for further evaluation.  He has lost 30 lbs in the past one month, has low appetite, eats a small meals 1-2 times a day. He has moderate fatigue, able to tolerate routine activities including his work, but feels exhausted at the end of study. He has occasional constipation, denies recent rectal bleeding.  CURRENT THERAPY:  FOLFIRI and Avastin every 2 weeks   INTERIM HISTORY:  Shriyan Arakawa is a 57 y.o. male who returns today for follow up and he presents with his wife. He feels well today.  He recently came back form vacation, and he notes that his neuropathy became worse during his trip. He also complains of having bad back posture. He doesn't want physical therapy for that.  He wants to try biking before trying PT. He reports occasional nausea and upset stomach. No heartburn. He likes spicy food and is trying to decrease his intake to cope with chemo. He has an episode of diarrhea  yesterday, which he relates to food he ate. He is intolerant to cold. No fever. He feels depressed, anxious, and angry about his condition. HE tried to talk to people, but it's not helping.  He reports left trapezoid muscle pain for a month that is causing upper back pain. There is no limitations to the range of motion, but motion of left upper limb can be painful. He has taken tylenol to relief the pain. He is right handed. He reports diving recently.   He doesn't measure his BP at home but is taking the medication.    MEDICAL HISTORY:  Past Medical History:  Diagnosis Date  . Anxiety   . Cancer of ascending colon ([place])   . Seasonal allergies     SURGICAL HISTORY: Past Surgical History:  Procedure Laterality Date  . APPENDECTOMY  1992  . CATARACT EXTRACTION W/ INTRAOCULAR LENS IMPLANT Left 04/2016  . COLON SURGERY    .  COLONOSCOPY W/ BIOPSIES AND POLYPECTOMY  04/2015  . INGUINAL HERNIA REPAIR Right 1982  . IR RADIOLOGIST EVAL & MGMT  01/16/2017  . KNEE ARTHROSCOPY W/ MENISCECTOMY Right 1998  . LAPAROSCOPIC RIGHT COLECTOMY N/A 06/14/2016   Procedure: LAPAROSCOPIC HAND ASSISTED HEMICOLECTOMY AND SMALL BOWEL RESECTION.;  Surgeon: [name], MD;  Location: [place];  Service: General;  Laterality: N/A;  . LIVER BIOPSY Right 06/14/2016   Procedure: LIVER BIOPSY;  Surgeon: [name], MD;  Location: [place];  Service: General;  Laterality: Right;  Right Inferior Liver  . PORTACATH PLACEMENT N/A 07/06/2016   Procedure: INSERTION PORT-A-CATH;  Surgeon: [name], MD;  Location: [place];  Service: General;  Laterality: N/A;  . VASECTOMY  2005    SOCIAL HISTORY: Social History   Socioeconomic History  . Marital status: Married    Spouse name: Not on file  . Number of children: Not on file  . Years of education: Not on file  . Highest education level: Not on file  Occupational History  . Not on file  Social Needs  . Financial resource strain: Not on file  . Food  insecurity:    Worry: Not on file    Inability: Not on file  . Transportation needs:    Medical: Not on file    Non-medical: Not on file  Tobacco Use  . Smoking status: Former Smoker    Years: 2.00    Types: Cigarettes    Last attempt to quit: 1990    Years since quitting: 29.5  . Smokeless tobacco: Never Used  Substance and Sexual Activity  . Alcohol use: Yes    Alcohol/week: 2.4 oz    Types: 4 Cans of beer per week    Comment: social, none since colon surgery  . Drug use: No    Comment: 06/15/2016 "nothing since college"  . Sexual activity: Yes  Lifestyle  . Physical activity:    Days per week: Not on file    Minutes per session: Not on file  . Stress: Not on file  Relationships  . Social connections:    Talks on phone: Not on file    Gets together: Not on file    Attends religious service: Not on file    Active member of club or organization: Not on file    Attends meetings of clubs or organizations: Not on file    Relationship status: Not on file  . Intimate partner violence:    Fear of current or ex partner: Not on file    Emotionally abused: Not on file    Physically abused: Not on file    Forced sexual activity: Not on file  Other Topics Concern  . Not on file  Social History Narrative  . Not on file   He is married. They have 3 boys, 76-12 yo. He works for a [employer], desk job.   FAMILY HISTORY: Family History  Problem Relation Age of Onset  . Cancer Mother        lung cancer  . Stroke Mother   . Hypertension Father   . CAD Father   . Cancer Maternal Grandfather        prostate cancer     ALLERGIES:  is allergic to penicillins and xeloda [capecitabine].  MEDICATIONS:  Current Outpatient Medications  Medication Sig Dispense Refill  . amLODipine (NORVASC) 10 MG tablet Take 1 tablet (10 mg total) by mouth daily. 30 tablet 2  . celecoxib (CELEBREX) 200 MG  capsule Take 1 capsule (200 mg total) by mouth daily. 60 capsule 1  .  diphenoxylate-atropine (LOMOTIL) 2.5-0.025 MG tablet Take 2 tablets by mouth 4 (four) times daily as needed for diarrhea or loose stools. 30 tablet 0  . lidocaine-prilocaine (EMLA) cream Apply to affected area once 30 g 3  . LORazepam (ATIVAN) 0.5 MG tablet Take 1 tablet (0.5 mg total) by mouth every 8 (eight) hours as needed (for nausea). 30 tablet 0  . Multiple Vitamin (MULTIVITAMIN WITH MINERALS) TABS tablet Take 1 tablet by mouth daily.    . ondansetron (ZOFRAN ODT) 8 MG disintegrating tablet Take 1 tablet (8 mg total) by mouth every 8 (eight) hours as needed for nausea or vomiting. 30 tablet 2  . prochlorperazine (COMPAZINE) 10 MG tablet Take 1 tablet (10 mg total) by mouth every 6 (six) hours as needed for nausea or vomiting. 30 tablet 3  . traMADol (ULTRAM) 50 MG tablet Take 1 tablet (50 mg total) by mouth every 6 (six) hours as needed. 30 tablet 0  . urea (CARMOL) 10 % cream Apply topically 3 (three) times daily. 71 g 1  . dexamethasone (DECADRON) 4 MG tablet UP TO FIVE DAYS AFTER CHEMO (BEGIN DAY TWO) 20 tablet 0  . lisinopril (PRINIVIL,ZESTRIL) 10 MG tablet Take 1 tablet (10 mg total) by mouth daily. 90 tablet 2  . traMADol (ULTRAM) 50 MG tablet Take by mouth 2 (two) times daily.    Marland Kitchen venlafaxine XR (EFFEXOR-XR) 75 MG 24 hr capsule Take 1 capsule (75 mg total) by mouth daily with breakfast. 30 capsule 1   No current facility-administered medications for this visit.    Facility-Administered Medications Ordered in Other Visits  Medication Dose Route Frequency Provider Last Rate Last Dose  . clindamycin (CLEOCIN) 900 mg in dextrose 5 % 50 mL IVPB  900 mg Intravenous 60 min Pre-Op [name], MD       And  . gentamicin (GARAMYCIN) 450 mg in dextrose 5 % 50 mL IVPB  5 mg/kg Intravenous 60 min Pre-Op [name], MD      . dexamethasone (DECADRON) injection 10 mg  10 mg Intravenous Once Harle Stanford., PA-C      . diphenoxylate-atropine (LOMOTIL) 2.5-0.025 MG per tablet 2 tablet  2  tablet Oral Once Harle Stanford., PA-C      . heparin lock flush 100 unit/mL  500 Units Intracatheter Once PRN Truitt Merle, MD      . sodium chloride flush (NS) 0.9 % injection 10 mL  10 mL Intracatheter PRN Truitt Merle, MD         REVIEW OF SYSTEMS:  Constitutional: Denies fevers, chills or abnormal night sweats Eyes: Denies blurriness of vision, double vision or watery eyes Ears, nose, mouth, throat, and face:  Respiratory:negative  Cardiovascular: Denies palpitation, chest discomfort or lower extremity swelling Gastrointestinal:  Denies change in bowel habits   (+) nausea Lymphatics: Denies new lymphadenopathy or easy bruising Neurological: (+) neuropathy in all 4 extremities MSK: (+) upper back and left shoulder and limb pain Behavioral/Psych: (+) depression and anxiety All other systems were reviewed with the patient and are negative.  PHYSICAL EXAMINATION:  ECOG PERFORMANCE STATUS: 1 - Symptomatic but completely ambulatory  Vitals:   11/14/17 1027  BP: (!) 150/92  Pulse: 73  Resp: 18  Temp: 98 F (36.7 C)  TempSrc: Oral  SpO2: 100%  Weight: 192 lb 8 oz (87.3 kg)  Height: _0  (1.803 m)     GENERAL:alert, no  distress and comfortable. SKIN: skin color, texture, turgor are normal, no rashes or significant lesions EYES: normal, conjunctiva are pink and non-injected, sclera clear OROPHARYNX:no exudate, no erythema and lips, buccal mucosa, and tongue normal   NECK: supple, thyroid normal size, non-tender, without nodularity LYMPH:  no palpable lymphadenopathy in the cervical, axillary or inguinal LUNGS: clear to auscultation and percussion with normal breathing effort HEART: regular rate & rhythm and no murmurs and no lower extremity edema ABDOMEN:abdomen soft, Surgical scar in the midline around the umbilical has healed well. non-tender and normal bowel sounds Musculoskeletal:no cyanosis of digits and no clubbing  PSYCH: alert & oriented x 3 with fluent speech NEURO: no  focal motor/sensory deficits   LABORATORY DATA:  I have reviewed the data as listed CBC Latest Ref Rng & Units 11/14/2017 10/25/2017 10/11/2017  WBC 4.0 - 10.3 K/uL 2.8(L) 3.4(L) 4.2  Hemoglobin 13.0 - 17.1 g/dL 12.9(L) 13.5 13.6  Hematocrit 38.4 - 49.9 % 38.7 39.8 39.8  Platelets 140 - 400 K/uL 175 142 151   CMP Latest Ref Rng & Units 10/25/2017 10/11/2017 09/27/2017  Glucose 70 - 140 mg/dL 82 84 128  BUN 7 - 26 mg/dL _0 Creatinine 0.70 - 1.30 mg/dL 0.94 1.03 1.16  Sodium 136 - 145 mmol/L 140 139 138  Potassium 3.5 - 5.1 mmol/L 4.6 4.5 4.1  Chloride 98 - 109 mmol/L 106 104 104  CO2 22 - 29 mmol/L _1 Calcium 8.4 - 10.4 mg/dL 9.4 9.1 9.1  Total Protein 6.4 - 8.3 g/dL 6.9 7.1 7.1  Total Bilirubin 0.2 - 1.2 mg/dL 0.4 0.8 0.6  Alkaline Phos 40 - 150 U/L 50 50 52  AST 5 - 34 U/L _2 ALT 0 - 55 U/L _3 TUMOR MARKER Results for Nicholas Lowery, Nicholas Lowery (MRN [id number]) as of 11/13/2017 11:59  Ref. Range 07/12/2017 09:14 08/09/2017 10:47 09/13/2017 11:08 10/11/2017 10:40 10/25/2017 10:41  CEA ([facility]) Latest Ref Range: 0.00 - 5.00 ng/mL 3.26 3.81 4.59 4.95 5.26 (H)     PATHOLOGY   Diagnosis 06/14/2016 1. Colon, segmental resection for tumor, Right Ascending Hemicolectomy and Small Bowel - INVASIVE WELL TO MODERATELY DIFFERENTIATED ADENOCARCINOMA. - TWO TUMOR FOCI MEASURING 7.5 CM AND 4.5 CM IN GREATEST DIMENSION. - TUMOR INVADES THROUGH FULL THICKNESS OF COLON, THROUGH THE SEROSA TO INVOLVE THE SMALL BOWEL. - MARGINS ARE NEGATIVE. - TWENTY FOUR OF SIXTY FOUR LYMPH NODES POSITIVE FOR METASTATIC ADENOCARCINOMA (24/64). - EXTRACAPSULAR EXTENSION IDENTIFIED - MULTIPLE SATELLITE TUMOR DEPOSITS PRESENT. - SEE ONCOLOGY TEMPLATE. 2. Liver, needle/core biopsy, Right Inferior - POSITIVE FOR METASTATIC ADENOCARCINOMA.    RADIOGRAPHIC STUDIES: I have personally reviewed his outside CT scan from 05/29/2016 and agreed with the findings in the report. No results found.   MR  Abdomen W WO Contrast 01/11/2017 IMPRESSION: 1. No acute findings within the abdomen. Previously noted liver metastasis has resolved in the interval. No new lesions.  CT CAP 09/04/16 IMPRESSION: Interval right colectomy. Stable small liver metastasis in the inferior right hepatic lobe. Stable mild porta hepatis and aortocaval lymphadenopathy. Stable mild mediastinal lymphadenopathy. No new or progressive metastatic disease identified within the chest, abdomen, or pelvis.  ASSESSMENT & PLAN:  Ubaldo Daywalt is a 57 y.o. Caucasian male, without significant past medical history, presented with rectal bleeding in December 2016, colonoscopy showed 4 polyps, 2 of them showed invasive adenocarcinoma, unfortunately he was not aware of the pathology findings and was not treated. He now presented with  fatigue, weight loss, anorexia and abdominal pain  1. Right colon cancer with liver, node and probable lung metastasis, [id number], stage IV, MSI-stable, KRAS G12D mutation (+) -I previously reviewed her PET scan findings, which showed solitary liver metastasis, abdominal and possible thoracic node metastasis  -His liver biopsy confirmed metastasis -Due to the bowel obstruction, he underwent upfront hemicolectomy. I previously reviewed his surgical pathology findings, which showed 2 primary right colon cancer, very locally advanced with 24 lymph nodes positive, surgical margins were negative. -I previously discussed his [facility] one genomic testing results, which showed care arrest mutation, and as high stable, low tumor burden. So he would not benefit from EGFR inhibitor, or immunotherapy alone. -The was started on palliative first-line chemotherapy FOLFOX on 07/13/16. Avastin was added from cycle 3 -He has solitary liver metastasis, but multiple mild hypermetabolic adenopathy in mediastinum and abdomen. We previously discussed the possible local therapy options, such as liver metastectomy or  ablation if he has great response to chemo and his node metastasis resolves  -restaging CT from 09/04/2016 reviewed with pt, stable disease. Will continue chemo -he developed side effects from FOLFOX, and a worsening thrombocytopenia, despite reduced dose of oxaliplatin  -Due to his overall limited response to FOLFOX, and his wish to pursue surgery, I previously recommend him to change chemo to FOLFIRINOX starting with cycle 7 with low dose oxaliplatin, hopefully he will response better. -Due to his severe nausea and vomiting, anorexia, mild diarrhea, reduced his irinotecan dose from cycle 10  - PET scan from 12/19/2016 reviewed in person. He had near complete metabolic response from chemotherapy, post his liver metastasis and hypermetabolic lymph nodes are negative on PET scan with decreased size.  -Due to his worsening neuropathy, and good response to chemotherapy, I previously stopped oxaliplatin from cycle 11, and continue FOLFIRI and avastin   -Reviewed his restaging abdominal MRI findings, which showed no visible lesions in the liver. He has had a complete radiographic response -He was seen by interventional radiologist Dr. Earleen Newport, who does not think he needed a liver ablation since the solid liver met has resolved after chemo. I agree with his plan.  -He was recently seen by surgeon Dr. Barry Dienes, who recommend maintenance chemotherapy for now, no indication for resection at this point given -Given his excellent response to chemotherapy, and the poor tolerance to intravenous chemotherapy, I recommended him to change treatments to maintenance chemotherapy therapy -He started Xeloda and Avastin maintenance therapy 02/01/17, due to very poor tolerance he stopped Xeloda 02/14/17 -he has been on  5-Fu pump and avastin starting 02/22/17.  -Patient would like to come off chemo if possible. I suggest another 3-4 months maintenance therapy and repeat a scan before discussing stopping chemo. He agreed.   -The  patient favors to take chemo break if the next PET scan is completely negative. He understands his cancer is probably not cured, and possible disease progression after stopping chemo. -We discussed his PET from 07/09/17 which shows a stable and a new small nodule in the lungs, indeterminate cause at this time. I encouraged him to continue with his current maintenance chemo regimen. Per Pt request I will give him a month of chemo break after 07/12/17 cycle of chemo.  -His restaging CT on 10/11/2017 unfortunately showed several subcentimeter pulmonary nodules scattered in both lungs, new or increased from prior scan, most compatible with worsening pulmonary metastasis. No other new lesions -I have changed his chemo back to FOLFIRI and Avastin, he is tolerating moderately well -Lab  reviewed, adequate to proceed with cycle 3 chemo, plan to repeat staging CT scan after 5-6 cycles   2. Depression, Anxiety  -Pt has long-standing history of depression, has been on Prozac for decades, it was very well controlled in the past.   -He has become more depressed lately, due to the stress from cancer diagnosis, especially stage IV, with prolonged chemotherapy, stress from work and family responsibility, etc.  He has developed multiple symptoms, including depressed mood, insomnia, personality change, etc.  He also has suicidal ideas. -He has good family and social support.   -We had a long discussion about depression management.  -I previously discussed the role of antidepressants, he has been on Prozac 40 mg for several years. I suggest changing to Effexor. He has weaned off Prozac in 2 weeks. Started Effexor 16m on week two of taper, will titrate dose if needed. -I also suggested he start attending GI Cancer support group, I had GI Navigator Dawn speak with him more about this and other resources. Dawn recommend him to see psychologist AEarl Lites he has met her twice, but does not feel it is very helpful. -His mood  has improved, but he still has episodes of crying, and angry.  He is on Effexor 75 mg daily, I recommend him to increase to 150 mg daily, he agrees.   3. Hypertension  -We previously discussed Avastin can cause hypertension -I previously started him on amlodipine, and increase dose to 10 mg daily -He does not want to follow-up with his primary care physician. -Previously, his blood pressure was noted to be elevated, especially diastolic. I previously added on lisinopril 10 mg daily for him -Continue Norvasc and Lisinopril for now while on Avastin -Will continue to monitor -BP better controlled with Lisinopril and amlodipine -His blood pressure was high in the infusion room, and gave him 0.1 mg clonidine on 07/12/17 -Overall blood pressure has improved  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 -I previously recommend him to take oral iron supplement over-the-counter, potential side effects of constipation and gastric or discomfort or discussed with him. He stopped taking this before surgery. -I previously advised him to begin taking oral iron supplements again post surgery -Hg normal and stable at 13.1 and iron study has been normal lately. Will continue to monitor.    5. Anorexia and weight loss secondary to chemo  -Secondary to underlying malignancy -I previously encouraged him to try nutritional supplement, such as boost or initial and try to eat more -He was encouraged to continue eating well and take supplemental drinks as needed to better maintain weight.  -I previously discussed how his depression can effect his appetite and to make sure he is eating and drinking well.  -weight has been increasing lately, will monitor.   6. Peripheral neuropathy, G1  -Secondary to oxaliplatin, dose reduced and ultimately discontinued -Slightly worsened when he was on FOLFIRI, held Irinotecan beginning 01/04/17 -Improved some, his hand  function normalized, no balance issue, discussed with patient this may take some time to resolve.  - I strongly encouraged him to take vitamin B-complex supplements -Not mentioned in today's visit, Likely stable or improving. Will continue to monitor.   7. Goal of care discussion  -We previously discussed the incurable nature of his cancer, and the overall poor prognosis, especially if he does not have good response to chemotherapy or progress on chemo -The patient understands the goal of care  is palliative.  -He is full code now     Plan  - I refilled dexamethasone, lisinopril, and venlafaxine. -He will increase venlafaxine from 62m to 1523mdaily -Lab reviewed, adequate for treatment, will proceed to cycle 3 FOLFIRI and Avastin today -Continue chemo every 2 weeks, I will see him back in 4 weeks.  Will order restaging scan on next office visit.   No orders of the defined types were placed in this encounter.   All questions were answered.   The patient knows to call the clinic with any problems, questions or concerns.  I spent 25 minutes counseling the patient face to face. The total time spent in the appointment was 30 minutes and more than 50% was on counseling.  I,Dierdre Searlesweik am acting as scribe for Dr. YaTruitt Merle I have reviewed the above documentation for accuracy and completeness, and I agree with the above.    YaTruitt Merle7/07/2017

## 2017-11-14 ENCOUNTER — Inpatient Hospital Stay: Payer: BLUE CROSS/BLUE SHIELD | Attending: Hematology

## 2017-11-14 ENCOUNTER — Telehealth: Payer: Self-pay | Admitting: Hematology

## 2017-11-14 ENCOUNTER — Encounter: Payer: Self-pay | Admitting: Hematology

## 2017-11-14 ENCOUNTER — Inpatient Hospital Stay: Payer: BLUE CROSS/BLUE SHIELD

## 2017-11-14 ENCOUNTER — Inpatient Hospital Stay (HOSPITAL_BASED_OUTPATIENT_CLINIC_OR_DEPARTMENT_OTHER): Payer: BLUE CROSS/BLUE SHIELD | Admitting: Hematology

## 2017-11-14 VITALS — BP 150/92 | HR 73 | Temp 98.0°F | Resp 18 | Ht 71.0 in | Wt 192.5 lb

## 2017-11-14 DIAGNOSIS — C771 Secondary and unspecified malignant neoplasm of intrathoracic lymph nodes: Secondary | ICD-10-CM | POA: Diagnosis not present

## 2017-11-14 DIAGNOSIS — C787 Secondary malignant neoplasm of liver and intrahepatic bile duct: Secondary | ICD-10-CM | POA: Diagnosis not present

## 2017-11-14 DIAGNOSIS — C7800 Secondary malignant neoplasm of unspecified lung: Secondary | ICD-10-CM | POA: Diagnosis not present

## 2017-11-14 DIAGNOSIS — D696 Thrombocytopenia, unspecified: Secondary | ICD-10-CM

## 2017-11-14 DIAGNOSIS — R630 Anorexia: Secondary | ICD-10-CM | POA: Insufficient documentation

## 2017-11-14 DIAGNOSIS — C182 Malignant neoplasm of ascending colon: Secondary | ICD-10-CM | POA: Insufficient documentation

## 2017-11-14 DIAGNOSIS — D500 Iron deficiency anemia secondary to blood loss (chronic): Secondary | ICD-10-CM | POA: Diagnosis not present

## 2017-11-14 DIAGNOSIS — I10 Essential (primary) hypertension: Secondary | ICD-10-CM

## 2017-11-14 DIAGNOSIS — G629 Polyneuropathy, unspecified: Secondary | ICD-10-CM

## 2017-11-14 DIAGNOSIS — Z79899 Other long term (current) drug therapy: Secondary | ICD-10-CM | POA: Insufficient documentation

## 2017-11-14 DIAGNOSIS — Z5111 Encounter for antineoplastic chemotherapy: Secondary | ICD-10-CM | POA: Insufficient documentation

## 2017-11-14 DIAGNOSIS — C189 Malignant neoplasm of colon, unspecified: Secondary | ICD-10-CM

## 2017-11-14 LAB — COMPREHENSIVE METABOLIC PANEL
ALBUMIN: 3.8 g/dL (ref 3.5–5.0)
ALK PHOS: 60 U/L (ref 38–126)
ALT: 21 U/L (ref 0–44)
AST: 22 U/L (ref 15–41)
Anion gap: 6 (ref 5–15)
BILIRUBIN TOTAL: 0.4 mg/dL (ref 0.3–1.2)
BUN: 12 mg/dL (ref 6–20)
CALCIUM: 9.2 mg/dL (ref 8.9–10.3)
CO2: 27 mmol/L (ref 22–32)
Chloride: 108 mmol/L (ref 98–111)
Creatinine, Ser: 0.87 mg/dL (ref 0.61–1.24)
GFR calc Af Amer: >60 mL/min (ref >60)
GFR calc non Af Amer: >60 mL/min (ref >60)
GLUCOSE: 80 mg/dL (ref 70–99)
POTASSIUM: 4.8 mmol/L (ref 3.5–5.1)
Sodium: 141 mmol/L (ref 135–145)
TOTAL PROTEIN: 6.7 g/dL (ref 6.5–8.1)

## 2017-11-14 LAB — CBC WITH DIFFERENTIAL/PLATELET
BASOS ABS: 0 10*3/uL (ref 0.0–0.1)
Basophils Relative: 1 %
Eosinophils Absolute: 0.1 10*3/uL (ref 0.0–0.5)
Eosinophils Relative: 3 %
HCT: 38.7 % (ref 38.4–49.9)
Hemoglobin: 12.9 g/dL — ABNORMAL LOW (ref 13.0–17.1)
LYMPHS PCT: 23 %
Lymphs Abs: 0.7 10*3/uL — ABNORMAL LOW (ref 0.9–3.3)
MCH: 32.9 pg (ref 27.2–33.4)
MCHC: 33.3 g/dL (ref 32.0–36.0)
MCV: 98.7 fL — AB (ref 79.3–98.0)
Monocytes Absolute: 0.4 10*3/uL (ref 0.1–0.9)
Monocytes Relative: 15 %
Neutro Abs: 1.6 10*3/uL (ref 1.5–6.5)
Neutrophils Relative %: 58 %
PLATELETS: 175 10*3/uL (ref 140–400)
RBC: 3.92 MIL/uL — ABNORMAL LOW (ref 4.20–5.82)
RDW: 16.2 % — ABNORMAL HIGH (ref 11.0–14.6)
WBC: 2.8 10*3/uL — ABNORMAL LOW (ref 4.0–10.3)

## 2017-11-14 LAB — IRON AND TIBC
Iron: 70 ug/dL (ref 42–163)
Saturation Ratios: 22 % — ABNORMAL LOW (ref 42–163)
TIBC: 324 ug/dL (ref 202–409)
UIBC: 254 ug/dL

## 2017-11-14 LAB — FERRITIN: Ferritin: 92 ng/mL (ref 24–336)

## 2017-11-14 LAB — TOTAL PROTEIN, URINE DIPSTICK: PROTEIN: NEGATIVE mg/dL

## 2017-11-14 LAB — CEA (IN HOUSE-CHCC): CEA (CHCC-In House): 4.82 ng/mL (ref 0.00–5.00)

## 2017-11-14 MED ORDER — SODIUM CHLORIDE 0.9 % IV SOLN
2200.0000 mg/m2 | INTRAVENOUS | Status: DC
  Administered 2017-11-14: 4600 mg via INTRAVENOUS
  Filled 2017-11-14: qty 92

## 2017-11-14 MED ORDER — SODIUM CHLORIDE 0.9 % IV SOLN
Freq: Once | INTRAVENOUS | Status: AC
  Administered 2017-11-14: 11:00:00 via INTRAVENOUS

## 2017-11-14 MED ORDER — LORAZEPAM 2 MG/ML IJ SOLN
0.5000 mg | Freq: Once | INTRAMUSCULAR | Status: AC
  Administered 2017-11-14: 0.5 mg via INTRAVENOUS

## 2017-11-14 MED ORDER — PALONOSETRON HCL INJECTION 0.25 MG/5ML
0.2500 mg | Freq: Once | INTRAVENOUS | Status: AC
  Administered 2017-11-14: 0.25 mg via INTRAVENOUS

## 2017-11-14 MED ORDER — LEUCOVORIN CALCIUM INJECTION 350 MG
400.0000 mg/m2 | Freq: Once | INTRAVENOUS | Status: AC
  Administered 2017-11-14: 840 mg via INTRAVENOUS
  Filled 2017-11-14: qty 42

## 2017-11-14 MED ORDER — SODIUM CHLORIDE 0.9 % IV SOLN
5.0000 mg/kg | Freq: Once | INTRAVENOUS | Status: AC
  Administered 2017-11-14: 450 mg via INTRAVENOUS
  Filled 2017-11-14: qty 16

## 2017-11-14 MED ORDER — FOSAPREPITANT DIMEGLUMINE INJECTION 150 MG
Freq: Once | INTRAVENOUS | Status: AC
  Administered 2017-11-14: 12:00:00 via INTRAVENOUS
  Filled 2017-11-14: qty 5

## 2017-11-14 MED ORDER — IRINOTECAN HCL CHEMO INJECTION 100 MG/5ML
160.0000 mg/m2 | Freq: Once | INTRAVENOUS | Status: AC
  Administered 2017-11-14: 340 mg via INTRAVENOUS
  Filled 2017-11-14: qty 15

## 2017-11-14 MED ORDER — ATROPINE SULFATE 1 MG/ML IJ SOLN
0.5000 mg | Freq: Once | INTRAMUSCULAR | Status: AC
  Administered 2017-11-14: 0.5 mg via INTRAVENOUS

## 2017-11-14 MED ORDER — PALONOSETRON HCL INJECTION 0.25 MG/5ML
INTRAVENOUS | Status: AC
  Filled 2017-11-14: qty 5

## 2017-11-14 MED ORDER — ATROPINE SULFATE 1 MG/ML IJ SOLN
INTRAMUSCULAR | Status: AC
  Filled 2017-11-14: qty 1

## 2017-11-14 MED ORDER — LORAZEPAM 2 MG/ML IJ SOLN
INTRAMUSCULAR | Status: AC
  Filled 2017-11-14: qty 1

## 2017-11-14 NOTE — Telephone Encounter (Signed)
No LOS 7/3

## 2017-11-14 NOTE — Patient Instructions (Signed)
[facility] Discharge Instructions for Patients Receiving Chemotherapy  Today you received the following chemotherapy agents: Avastin, Irinotecan, Leucovorin, 5FU  To help prevent nausea and vomiting after your treatment, we encourage you to take your nausea medication as directed.   If you develop nausea and vomiting that is not controlled by your nausea medication, call the clinic.   BELOW ARE SYMPTOMS THAT SHOULD BE REPORTED IMMEDIATELY:  *FEVER GREATER THAN 100.5 F  *CHILLS WITH OR WITHOUT FEVER  NAUSEA AND VOMITING THAT IS NOT CONTROLLED WITH YOUR NAUSEA MEDICATION  *UNUSUAL SHORTNESS OF BREATH  *UNUSUAL BRUISING OR BLEEDING  TENDERNESS IN MOUTH AND THROAT WITH OR WITHOUT PRESENCE OF ULCERS  *URINARY PROBLEMS  *BOWEL PROBLEMS  UNUSUAL RASH Items with * indicate a potential emergency and should be followed up as soon as possible.  Feel free to call the clinic should you have any questions or concerns. The clinic phone number is [phone].  Please show the CHEMO ALERT CARD at check-in to the Emergency Department and triage nurse.   

## 2017-11-15 ENCOUNTER — Encounter: Payer: Self-pay | Admitting: Hematology

## 2017-11-16 ENCOUNTER — Inpatient Hospital Stay: Payer: BLUE CROSS/BLUE SHIELD

## 2017-11-16 VITALS — BP 138/89 | HR 78 | Temp 98.6°F | Resp 17

## 2017-11-16 DIAGNOSIS — C182 Malignant neoplasm of ascending colon: Secondary | ICD-10-CM | POA: Diagnosis not present

## 2017-11-16 DIAGNOSIS — C189 Malignant neoplasm of colon, unspecified: Secondary | ICD-10-CM

## 2017-11-16 DIAGNOSIS — C787 Secondary malignant neoplasm of liver and intrahepatic bile duct: Principal | ICD-10-CM

## 2017-11-16 MED ORDER — HEPARIN SOD (PORK) LOCK FLUSH 100 UNIT/ML IV SOLN
500.0000 [IU] | Freq: Once | INTRAVENOUS | Status: AC | PRN
  Administered 2017-11-16: 500 [IU]
  Filled 2017-11-16: qty 5

## 2017-11-16 MED ORDER — SODIUM CHLORIDE 0.9% FLUSH
10.0000 mL | INTRAVENOUS | Status: DC | PRN
  Administered 2017-11-16: 10 mL
  Filled 2017-11-16: qty 10

## 2017-11-16 NOTE — Patient Instructions (Signed)
Implanted Port Home Guide An implanted port is a type of central line that is placed under the skin. Central lines are used to provide IV access when treatment or nutrition needs to be given through a person's veins. Implanted ports are used for long-term IV access. An implanted port may be placed because:  You need IV medicine that would be irritating to the small veins in your hands or arms.  You need long-term IV medicines, such as antibiotics.  You need IV nutrition for a long period.  You need frequent blood draws for lab tests.  You need dialysis.  Implanted ports are usually placed in the chest area, but they can also be placed in the upper arm, the abdomen, or the leg. An implanted port has two main parts:  Reservoir. The reservoir is round and will appear as a small, raised area under your skin. The reservoir is the part where a needle is inserted to give medicines or draw blood.  Catheter. The catheter is a thin, flexible tube that extends from the reservoir. The catheter is placed into a large vein. Medicine that is inserted into the reservoir goes into the catheter and then into the vein.  How will I care for my incision site? Do not get the incision site wet. Bathe or shower as directed by your health care provider. How is my port accessed? Special steps must be taken to access the port:  Before the port is accessed, a numbing cream can be placed on the skin. This helps numb the skin over the port site.  Your health care provider uses a sterile technique to access the port. ? Your health care provider must put on a mask and sterile gloves. ? The skin over your port is cleaned carefully with an antiseptic and allowed to dry. ? The port is gently pinched between sterile gloves, and a needle is inserted into the port.  Only "non-coring" port needles should be used to access the port. Once the port is accessed, a blood return should be checked. This helps ensure that the port  is in the vein and is not clogged.  If your port needs to remain accessed for a constant infusion, a clear (transparent) bandage will be placed over the needle site. The bandage and needle will need to be changed every week, or as directed by your health care provider.  Keep the bandage covering the needle clean and dry. Do not get it wet. Follow your health care provider's instructions on how to take a shower or bath while the port is accessed.  If your port does not need to stay accessed, no bandage is needed over the port.  What is flushing? Flushing helps keep the port from getting clogged. Follow your health care provider's instructions on how and when to flush the port. Ports are usually flushed with saline solution or a medicine called heparin. The need for flushing will depend on how the port is used.  If the port is used for intermittent medicines or blood draws, the port will need to be flushed: ? After medicines have been given. ? After blood has been drawn. ? As part of routine maintenance.  If a constant infusion is running, the port may not need to be flushed.  How long will my port stay implanted? The port can stay in for as long as your health care provider thinks it is needed. When it is time for the port to come out, surgery will be   done to remove it. The procedure is similar to the one performed when the port was put in. When should I seek immediate medical care? When you have an implanted port, you should seek immediate medical care if:  You notice a bad smell coming from the incision site.  You have swelling, redness, or drainage at the incision site.  You have more swelling or pain at the port site or the surrounding area.  You have a fever that is not controlled with medicine.  This information is not intended to replace advice given to you by your health care provider. Make sure you discuss any questions you have with your health care provider. Document  Released: 05/01/2005 Document Revised: 10/07/2015 Document Reviewed: 01/06/2013 Elsevier Interactive Patient Education  2017 [employer]  

## 2017-11-21 ENCOUNTER — Other Ambulatory Visit: Payer: Self-pay

## 2017-11-21 MED ORDER — VENLAFAXINE HCL ER 150 MG PO CP24: 150.0000 mg | ORAL_CAPSULE | Freq: Every day | ORAL | 1 refills | Status: DC

## 2017-11-28 ENCOUNTER — Other Ambulatory Visit: Payer: BLUE CROSS/BLUE SHIELD

## 2017-11-28 ENCOUNTER — Ambulatory Visit: Payer: BLUE CROSS/BLUE SHIELD

## 2017-11-29 ENCOUNTER — Inpatient Hospital Stay: Payer: BLUE CROSS/BLUE SHIELD

## 2017-11-29 VITALS — BP 143/98 | HR 77 | Temp 98.2°F | Resp 20

## 2017-11-29 DIAGNOSIS — C182 Malignant neoplasm of ascending colon: Secondary | ICD-10-CM | POA: Diagnosis not present

## 2017-11-29 DIAGNOSIS — C787 Secondary malignant neoplasm of liver and intrahepatic bile duct: Principal | ICD-10-CM

## 2017-11-29 DIAGNOSIS — C189 Malignant neoplasm of colon, unspecified: Secondary | ICD-10-CM

## 2017-11-29 DIAGNOSIS — Z95828 Presence of other vascular implants and grafts: Secondary | ICD-10-CM

## 2017-11-29 LAB — CBC WITH DIFFERENTIAL/PLATELET
Basophils Absolute: 0 10*3/uL (ref 0.0–0.1)
Basophils Relative: 1 %
EOS PCT: 1 %
Eosinophils Absolute: 0.1 10*3/uL (ref 0.0–0.5)
HCT: 38.5 % (ref 38.4–49.9)
Hemoglobin: 13.1 g/dL (ref 13.0–17.1)
LYMPHS ABS: 0.6 10*3/uL — AB (ref 0.9–3.3)
LYMPHS PCT: 10 %
MCH: 33.5 pg — AB (ref 27.2–33.4)
MCHC: 34.1 g/dL (ref 32.0–36.0)
MCV: 98.1 fL — AB (ref 79.3–98.0)
MONO ABS: 0.6 10*3/uL (ref 0.1–0.9)
Monocytes Relative: 10 %
Neutro Abs: 4.6 10*3/uL (ref 1.5–6.5)
Neutrophils Relative %: 78 %
PLATELETS: 181 10*3/uL (ref 140–400)
RBC: 3.92 MIL/uL — AB (ref 4.20–5.82)
RDW: 15.7 % — AB (ref 11.0–14.6)
WBC: 5.9 10*3/uL (ref 4.0–10.3)

## 2017-11-29 LAB — COMPREHENSIVE METABOLIC PANEL
ALT: 24 U/L (ref 0–44)
AST: 19 U/L (ref 15–41)
Albumin: 3.9 g/dL (ref 3.5–5.0)
Alkaline Phosphatase: 61 U/L (ref 38–126)
Anion gap: 8 (ref 5–15)
BILIRUBIN TOTAL: 0.6 mg/dL (ref 0.3–1.2)
BUN: 15 mg/dL (ref 6–20)
CHLORIDE: 105 mmol/L (ref 98–111)
CO2: 26 mmol/L (ref 22–32)
CREATININE: 0.94 mg/dL (ref 0.61–1.24)
Calcium: 9.3 mg/dL (ref 8.9–10.3)
Glucose, Bld: 101 mg/dL — ABNORMAL HIGH (ref 70–99)
Potassium: 4.6 mmol/L (ref 3.5–5.1)
Sodium: 139 mmol/L (ref 135–145)
Total Protein: 6.9 g/dL (ref 6.5–8.1)

## 2017-11-29 LAB — TOTAL PROTEIN, URINE DIPSTICK: Protein, ur: NEGATIVE mg/dL

## 2017-11-29 MED ORDER — SODIUM CHLORIDE 0.9 % IV SOLN
Freq: Once | INTRAVENOUS | Status: AC
  Administered 2017-11-29: 13:00:00 via INTRAVENOUS
  Filled 2017-11-29: qty 5

## 2017-11-29 MED ORDER — PALONOSETRON HCL INJECTION 0.25 MG/5ML
0.2500 mg | Freq: Once | INTRAVENOUS | Status: AC
  Administered 2017-11-29: 0.25 mg via INTRAVENOUS

## 2017-11-29 MED ORDER — HEPARIN SOD (PORK) LOCK FLUSH 100 UNIT/ML IV SOLN
500.0000 [IU] | Freq: Once | INTRAVENOUS | Status: DC | PRN
  Filled 2017-11-29: qty 5

## 2017-11-29 MED ORDER — IRINOTECAN HCL CHEMO INJECTION 100 MG/5ML
160.0000 mg/m2 | Freq: Once | INTRAVENOUS | Status: AC
  Administered 2017-11-29: 340 mg via INTRAVENOUS
  Filled 2017-11-29: qty 15

## 2017-11-29 MED ORDER — PALONOSETRON HCL INJECTION 0.25 MG/5ML
INTRAVENOUS | Status: AC
  Filled 2017-11-29: qty 5

## 2017-11-29 MED ORDER — SODIUM CHLORIDE 0.9% FLUSH
10.0000 mL | INTRAVENOUS | Status: DC | PRN
  Filled 2017-11-29: qty 10

## 2017-11-29 MED ORDER — FLUOROURACIL CHEMO INJECTION 5 GM/100ML
2200.0000 mg/m2 | INTRAVENOUS | Status: DC
  Administered 2017-11-29: 4600 mg via INTRAVENOUS
  Filled 2017-11-29: qty 92

## 2017-11-29 MED ORDER — SODIUM CHLORIDE 0.9 % IV SOLN
5.0000 mg/kg | Freq: Once | INTRAVENOUS | Status: AC
  Administered 2017-11-29: 450 mg via INTRAVENOUS
  Filled 2017-11-29: qty 2

## 2017-11-29 MED ORDER — SODIUM CHLORIDE 0.9% FLUSH
10.0000 mL | Freq: Once | INTRAVENOUS | Status: AC
  Administered 2017-11-29: 10 mL
  Filled 2017-11-29: qty 10

## 2017-11-29 MED ORDER — ATROPINE SULFATE 1 MG/ML IJ SOLN
INTRAMUSCULAR | Status: AC
  Filled 2017-11-29: qty 1

## 2017-11-29 MED ORDER — ATROPINE SULFATE 1 MG/ML IJ SOLN
0.5000 mg | Freq: Once | INTRAMUSCULAR | Status: AC
  Administered 2017-11-29: 0.5 mg via INTRAVENOUS

## 2017-11-29 MED ORDER — LEUCOVORIN CALCIUM INJECTION 350 MG
400.0000 mg/m2 | Freq: Once | INTRAVENOUS | Status: AC
  Administered 2017-11-29: 840 mg via INTRAVENOUS
  Filled 2017-11-29: qty 42

## 2017-11-29 NOTE — Patient Instructions (Signed)
[facility] Discharge Instructions for Patients Receiving Chemotherapy  Today you received the following chemotherapy agents: Avastin, Irinotecan, Leucovorin, 5FU  To help prevent nausea and vomiting after your treatment, we encourage you to take your nausea medication as directed.   If you develop nausea and vomiting that is not controlled by your nausea medication, call the clinic.   BELOW ARE SYMPTOMS THAT SHOULD BE REPORTED IMMEDIATELY:  *FEVER GREATER THAN 100.5 F  *CHILLS WITH OR WITHOUT FEVER  NAUSEA AND VOMITING THAT IS NOT CONTROLLED WITH YOUR NAUSEA MEDICATION  *UNUSUAL SHORTNESS OF BREATH  *UNUSUAL BRUISING OR BLEEDING  TENDERNESS IN MOUTH AND THROAT WITH OR WITHOUT PRESENCE OF ULCERS  *URINARY PROBLEMS  *BOWEL PROBLEMS  UNUSUAL RASH Items with * indicate a potential emergency and should be followed up as soon as possible.  Feel free to call the clinic should you have any questions or concerns. The clinic phone number is [phone].  Please show the CHEMO ALERT CARD at check-in to the Emergency Department and triage nurse.   

## 2017-12-01 ENCOUNTER — Inpatient Hospital Stay: Payer: BLUE CROSS/BLUE SHIELD

## 2017-12-01 VITALS — BP 143/97 | HR 70 | Temp 98.2°F | Resp 16

## 2017-12-01 DIAGNOSIS — C787 Secondary malignant neoplasm of liver and intrahepatic bile duct: Principal | ICD-10-CM

## 2017-12-01 DIAGNOSIS — C189 Malignant neoplasm of colon, unspecified: Secondary | ICD-10-CM

## 2017-12-01 DIAGNOSIS — C182 Malignant neoplasm of ascending colon: Secondary | ICD-10-CM | POA: Diagnosis not present

## 2017-12-01 MED ORDER — SODIUM CHLORIDE 0.9% FLUSH
10.0000 mL | INTRAVENOUS | Status: DC | PRN
  Administered 2017-12-01: 10 mL
  Filled 2017-12-01: qty 10

## 2017-12-01 MED ORDER — HEPARIN SOD (PORK) LOCK FLUSH 100 UNIT/ML IV SOLN
500.0000 [IU] | Freq: Once | INTRAVENOUS | Status: AC | PRN
  Administered 2017-12-01: 500 [IU]
  Filled 2017-12-01: qty 5

## 2017-12-01 NOTE — Patient Instructions (Signed)
Implanted Port Home Guide An implanted port is a type of central line that is placed under the skin. Central lines are used to provide IV access when treatment or nutrition needs to be given through a person's veins. Implanted ports are used for long-term IV access. An implanted port may be placed because:  You need IV medicine that would be irritating to the small veins in your hands or arms.  You need long-term IV medicines, such as antibiotics.  You need IV nutrition for a long period.  You need frequent blood draws for lab tests.  You need dialysis.  Implanted ports are usually placed in the chest area, but they can also be placed in the upper arm, the abdomen, or the leg. An implanted port has two main parts:  Reservoir. The reservoir is round and will appear as a small, raised area under your skin. The reservoir is the part where a needle is inserted to give medicines or draw blood.  Catheter. The catheter is a thin, flexible tube that extends from the reservoir. The catheter is placed into a large vein. Medicine that is inserted into the reservoir goes into the catheter and then into the vein.  How will I care for my incision site? Do not get the incision site wet. Bathe or shower as directed by your health care provider. How is my port accessed? Special steps must be taken to access the port:  Before the port is accessed, a numbing cream can be placed on the skin. This helps numb the skin over the port site.  Your health care provider uses a sterile technique to access the port. ? Your health care provider must put on a mask and sterile gloves. ? The skin over your port is cleaned carefully with an antiseptic and allowed to dry. ? The port is gently pinched between sterile gloves, and a needle is inserted into the port.  Only "non-coring" port needles should be used to access the port. Once the port is accessed, a blood return should be checked. This helps ensure that the port  is in the vein and is not clogged.  If your port needs to remain accessed for a constant infusion, a clear (transparent) bandage will be placed over the needle site. The bandage and needle will need to be changed every week, or as directed by your health care provider.  Keep the bandage covering the needle clean and dry. Do not get it wet. Follow your health care provider's instructions on how to take a shower or bath while the port is accessed.  If your port does not need to stay accessed, no bandage is needed over the port.  What is flushing? Flushing helps keep the port from getting clogged. Follow your health care provider's instructions on how and when to flush the port. Ports are usually flushed with saline solution or a medicine called heparin. The need for flushing will depend on how the port is used.  If the port is used for intermittent medicines or blood draws, the port will need to be flushed: ? After medicines have been given. ? After blood has been drawn. ? As part of routine maintenance.  If a constant infusion is running, the port may not need to be flushed.  How long will my port stay implanted? The port can stay in for as long as your health care provider thinks it is needed. When it is time for the port to come out, surgery will be   done to remove it. The procedure is similar to the one performed when the port was put in. When should I seek immediate medical care? When you have an implanted port, you should seek immediate medical care if:  You notice a bad smell coming from the incision site.  You have swelling, redness, or drainage at the incision site.  You have more swelling or pain at the port site or the surrounding area.  You have a fever that is not controlled with medicine.  This information is not intended to replace advice given to you by your health care provider. Make sure you discuss any questions you have with your health care provider. Document  Released: 05/01/2005 Document Revised: 10/07/2015 Document Reviewed: 01/06/2013 Elsevier Interactive Patient Education  2017 [employer]  

## 2017-12-12 ENCOUNTER — Ambulatory Visit: Payer: BLUE CROSS/BLUE SHIELD

## 2017-12-12 ENCOUNTER — Other Ambulatory Visit: Payer: BLUE CROSS/BLUE SHIELD

## 2017-12-13 ENCOUNTER — Inpatient Hospital Stay: Payer: BLUE CROSS/BLUE SHIELD | Attending: Hematology

## 2017-12-13 ENCOUNTER — Other Ambulatory Visit: Payer: Self-pay | Admitting: Hematology

## 2017-12-13 ENCOUNTER — Inpatient Hospital Stay: Payer: BLUE CROSS/BLUE SHIELD

## 2017-12-13 ENCOUNTER — Inpatient Hospital Stay (HOSPITAL_BASED_OUTPATIENT_CLINIC_OR_DEPARTMENT_OTHER): Payer: BLUE CROSS/BLUE SHIELD | Admitting: Hematology

## 2017-12-13 ENCOUNTER — Encounter: Payer: Self-pay | Admitting: Hematology

## 2017-12-13 ENCOUNTER — Encounter: Payer: Self-pay | Admitting: *Deleted

## 2017-12-13 VITALS — BP 129/90 | HR 77 | Temp 97.7°F | Resp 16 | Wt 188.0 lb

## 2017-12-13 DIAGNOSIS — Z79899 Other long term (current) drug therapy: Secondary | ICD-10-CM

## 2017-12-13 DIAGNOSIS — C787 Secondary malignant neoplasm of liver and intrahepatic bile duct: Principal | ICD-10-CM

## 2017-12-13 DIAGNOSIS — G620 Drug-induced polyneuropathy: Secondary | ICD-10-CM | POA: Insufficient documentation

## 2017-12-13 DIAGNOSIS — D696 Thrombocytopenia, unspecified: Secondary | ICD-10-CM | POA: Diagnosis not present

## 2017-12-13 DIAGNOSIS — C7800 Secondary malignant neoplasm of unspecified lung: Secondary | ICD-10-CM | POA: Insufficient documentation

## 2017-12-13 DIAGNOSIS — T451X5A Adverse effect of antineoplastic and immunosuppressive drugs, initial encounter: Secondary | ICD-10-CM

## 2017-12-13 DIAGNOSIS — I10 Essential (primary) hypertension: Secondary | ICD-10-CM | POA: Diagnosis not present

## 2017-12-13 DIAGNOSIS — G629 Polyneuropathy, unspecified: Secondary | ICD-10-CM | POA: Insufficient documentation

## 2017-12-13 DIAGNOSIS — C182 Malignant neoplasm of ascending colon: Secondary | ICD-10-CM

## 2017-12-13 DIAGNOSIS — C771 Secondary and unspecified malignant neoplasm of intrathoracic lymph nodes: Secondary | ICD-10-CM

## 2017-12-13 DIAGNOSIS — R630 Anorexia: Secondary | ICD-10-CM | POA: Diagnosis not present

## 2017-12-13 DIAGNOSIS — C189 Malignant neoplasm of colon, unspecified: Secondary | ICD-10-CM

## 2017-12-13 DIAGNOSIS — D500 Iron deficiency anemia secondary to blood loss (chronic): Secondary | ICD-10-CM

## 2017-12-13 DIAGNOSIS — Z5111 Encounter for antineoplastic chemotherapy: Secondary | ICD-10-CM | POA: Insufficient documentation

## 2017-12-13 LAB — COMPREHENSIVE METABOLIC PANEL
ALBUMIN: 3.9 g/dL (ref 3.5–5.0)
ALT: 22 U/L (ref 0–44)
ANION GAP: 9 (ref 5–15)
AST: 19 U/L (ref 15–41)
Alkaline Phosphatase: 64 U/L (ref 38–126)
BUN: 13 mg/dL (ref 6–20)
CO2: 28 mmol/L (ref 22–32)
Calcium: 9.5 mg/dL (ref 8.9–10.3)
Chloride: 104 mmol/L (ref 98–111)
Creatinine, Ser: 0.89 mg/dL (ref 0.61–1.24)
GFR calc Af Amer: >60 mL/min (ref >60)
GFR calc non Af Amer: >60 mL/min (ref >60)
GLUCOSE: 96 mg/dL (ref 70–99)
POTASSIUM: 4.1 mmol/L (ref 3.5–5.1)
SODIUM: 141 mmol/L (ref 135–145)
Total Bilirubin: 0.4 mg/dL (ref 0.3–1.2)
Total Protein: 7.1 g/dL (ref 6.5–8.1)

## 2017-12-13 LAB — CBC WITH DIFFERENTIAL/PLATELET
BASOS ABS: 0 10*3/uL (ref 0.0–0.1)
Basophils Relative: 1 %
Eosinophils Absolute: 0.1 10*3/uL (ref 0.0–0.5)
Eosinophils Relative: 2 %
HEMATOCRIT: 38.9 % (ref 38.4–49.9)
Hemoglobin: 13 g/dL (ref 13.0–17.1)
LYMPHS ABS: 0.8 10*3/uL — AB (ref 0.9–3.3)
LYMPHS PCT: 17 %
MCH: 33.2 pg (ref 27.2–33.4)
MCHC: 33.4 g/dL (ref 32.0–36.0)
MCV: 99.5 fL — AB (ref 79.3–98.0)
MONO ABS: 0.8 10*3/uL (ref 0.1–0.9)
MONOS PCT: 17 %
NEUTROS ABS: 3 10*3/uL (ref 1.5–6.5)
Neutrophils Relative %: 63 %
Platelets: 145 10*3/uL (ref 140–400)
RBC: 3.91 MIL/uL — ABNORMAL LOW (ref 4.20–5.82)
RDW: 16 % — AB (ref 11.0–14.6)
WBC: 4.7 10*3/uL (ref 4.0–10.3)

## 2017-12-13 LAB — IRON AND TIBC
Iron: 113 ug/dL (ref 42–163)
SATURATION RATIOS: 32 % — AB (ref 42–163)
TIBC: 351 ug/dL (ref 202–409)
UIBC: 238 ug/dL

## 2017-12-13 LAB — CEA (IN HOUSE-CHCC): CEA (CHCC-In House): 6.93 ng/mL — ABNORMAL HIGH (ref 0.00–5.00)

## 2017-12-13 LAB — FERRITIN: Ferritin: 173 ng/mL (ref 24–336)

## 2017-12-13 MED ORDER — LORAZEPAM 0.5 MG PO TABS: 0.5000 mg | ORAL_TABLET | Freq: Three times a day (TID) | ORAL | 0 refills | Status: DC | PRN

## 2017-12-13 MED ORDER — ATROPINE SULFATE 1 MG/ML IJ SOLN
INTRAMUSCULAR | Status: AC
  Filled 2017-12-13: qty 1

## 2017-12-13 MED ORDER — ATROPINE SULFATE 1 MG/ML IJ SOLN
0.5000 mg | Freq: Once | INTRAMUSCULAR | Status: AC
  Administered 2017-12-13: 0.5 mg via INTRAVENOUS

## 2017-12-13 MED ORDER — SODIUM CHLORIDE 0.9 % IV SOLN
Freq: Once | INTRAVENOUS | Status: AC
  Administered 2017-12-13: 14:00:00 via INTRAVENOUS
  Filled 2017-12-13: qty 250

## 2017-12-13 MED ORDER — SODIUM CHLORIDE 0.9 % IV SOLN
Freq: Once | INTRAVENOUS | Status: AC
  Administered 2017-12-13: 14:00:00 via INTRAVENOUS
  Filled 2017-12-13: qty 5

## 2017-12-13 MED ORDER — DEXTROSE 5 % IV SOLN
Freq: Once | INTRAVENOUS | Status: AC
  Administered 2017-12-13: 14:00:00 via INTRAVENOUS
  Filled 2017-12-13: qty 250

## 2017-12-13 MED ORDER — IRINOTECAN HCL CHEMO INJECTION 100 MG/5ML
160.0000 mg/m2 | Freq: Once | INTRAVENOUS | Status: AC
  Administered 2017-12-13: 340 mg via INTRAVENOUS
  Filled 2017-12-13: qty 15

## 2017-12-13 MED ORDER — SODIUM CHLORIDE 0.9% FLUSH
10.0000 mL | Freq: Once | INTRAVENOUS | Status: DC
  Filled 2017-12-13: qty 10

## 2017-12-13 MED ORDER — SODIUM CHLORIDE 0.9 % IV SOLN
Freq: Once | INTRAVENOUS | Status: DC
  Filled 2017-12-13: qty 250

## 2017-12-13 MED ORDER — PALONOSETRON HCL INJECTION 0.25 MG/5ML
INTRAVENOUS | Status: AC
  Filled 2017-12-13: qty 5

## 2017-12-13 MED ORDER — FLUOROURACIL CHEMO INJECTION 5 GM/100ML
2200.0000 mg/m2 | INTRAVENOUS | Status: DC
  Administered 2017-12-13: 4600 mg via INTRAVENOUS
  Filled 2017-12-13: qty 92

## 2017-12-13 MED ORDER — SODIUM CHLORIDE 0.9 % IV SOLN
5.0000 mg/kg | Freq: Once | INTRAVENOUS | Status: AC
  Administered 2017-12-13: 450 mg via INTRAVENOUS
  Filled 2017-12-13: qty 16

## 2017-12-13 MED ORDER — PALONOSETRON HCL INJECTION 0.25 MG/5ML
0.2500 mg | Freq: Once | INTRAVENOUS | Status: AC
  Administered 2017-12-13: 0.25 mg via INTRAVENOUS

## 2017-12-13 MED ORDER — LEUCOVORIN CALCIUM INJECTION 350 MG
400.0000 mg/m2 | Freq: Once | INTRAVENOUS | Status: AC
  Administered 2017-12-13: 840 mg via INTRAVENOUS
  Filled 2017-12-13: qty 42

## 2017-12-13 NOTE — Progress Notes (Signed)
[place]  [phone] [phone]  Clinic Follow Up Note  Patient Care Team: Curlene Labrum, MD as PCP - General (Family Medicine)   Date of Service:  12/13/2017   CHIEF COMPLAINTS:  Follow up metastatic right colon cancer   Oncology History   Cancer Staging Metastatic colon cancer to liver [facility]) Staging form: Colon and Rectum, AJCC 8th Edition - Clinical stage from 06/01/2016: Stage IVA (cTX, cNX, pM1a) - Signed by Truitt Merle, MD on 07/04/2016 - Pathologic stage from 06/14/2016: Stage IVA (pT4b(m), pN2b, pM1a) - Signed by Truitt Merle, MD on 07/04/2016       Metastatic colon cancer to liver ([place])   04/2015 Procedure    Colonoscopy by Dr. Ladona Horns. It showed showed 2 sessile polyps ready between 3-5 mm in size located 20 cm (A, B) from the point of entry, polypectomy was performed. Pedunculated polyp was found in the ascending colon (C), polypectomy was performed, and additional polyp (D) was found 30 cm from the point of entry, removed      04/2015 Pathology Results    tubular adenoma (A and B), and well differentiated adenocarcinoma arising from tubulovillous adenoma (C) and well differentiated adenocarcinoma arising from severe dysplasia to intramucosal carcinoma within tubular adenoma.       04/2015 Initial Diagnosis    Metastatic colon cancer to liver ([place])      05/29/2016 Imaging    CT abdomen and pelvis with contrast showed an apple core like stricture in right colon just above the cecum, measuring 3.2 cm in lengths. This is highly suspicious for malignancy. Small lymph node a noticed he had adjacent mesentery, measuring 8 mm. There is a low-density lesion within the inferior aspect of the right hepatic lobe measuring 1.7 cm, suspicious for metastasis.       06/06/2016 Tumor Marker    CEA 9.99      06/08/2016 PET scan    IMPRESSION: Approximately 3 cm hypermetabolic mass in the ascending colon, consistent with primary colon carcinoma.  This mass results in colonic obstruction and small bowel dilatation. Additional areas of hypermetabolic wall thickening in the cecum may represent other sites of colon carcinoma or colitis. Mild hypermetabolic lymphadenopathy in right pericolonic region, porta hepatis, and aortocaval space, consistent with metastatic disease. Mild hypermetabolic mediastinal lymphadenopathy also seen, and thoracic lymph node metastases cannot be excluded. Solitary hypermetabolic focus in inferior right hepatic lobe, consistent with liver metastasis. Consider abdomen MRI without and with contrast for further evaluation.      06/14/2016 Surgery    Hand assisted right hemicolectomy and small bowel resection for colon cancer, liver biopsy, by Dr. Barry Dienes      06/14/2016 Pathology Results    Right hemicolectomy showed invasive well to moderately differentiated adenocarcinoma, 2 foci measuring 7.5 cm and 4.5 cm, tumor invades through full thickness of colon, to the seroma and involve the Small Bowel, Surgical Margins Were Negative, 24 Out Of 64 Lymph Nodes Were Positive, Extracapsular Extension Identified, Multiple Satellite Tumor Deposits Present, Liver Biopsy Showed Metastatic Adenocarcinoma.        06/14/2016 Miscellaneous    Tumor MMR normal, MSI stable       06/14/2016 Miscellaneous    Foundation one genomic testing showed K-ras G12 D mutation, APC and TP53 mutation. No BRAF and NRAS mutation. MSI-stable, tumor burden low.      07/06/2016 Tumor Marker    CEA 13.69      07/13/2016 - 01/18/2017 Chemotherapy    mFOLFOX, every 2  weeks, started on 07/14/2015, Avastin added from cycle 3  Oxaliplatin dose to 54m/m2 due to side effects and some cytopenia on 09/21/16  Changed to FOLFIRINOX starting cycle 7 and Reduced  Due to neuropathy hold Oxaliplatin and add Irinotecan with neulasta on day 3 starting with cycle 7 Add low dose Oxaliplatin with cycle 8.  Due to his worsening neuropathy, and good response to  chemotherapy, I previously stopped oxaliplatin from cycle 11, and continue FOLFIRI and avastin         07/27/2016 Tumor Marker    CEA 15.85      09/04/2016 Imaging    Ct C/A/P W Contrast IMPRESSION: Interval right colectomy. Stable small liver metastasis in the inferior right hepatic lobe. Stable mild porta hepatis and aortocaval lymphadenopathy. Stable mild mediastinal lymphadenopathy. No new or progressive metastatic disease identified within the chest, abdomen, or pelvis.      12/19/2016 PET scan    IMPRESSION: 1. Right hemicolectomy, with resolution of the prior hypermetabolic activity inferiorly in the right hepatic lobe, in several mediastinal lymph nodes, and in lymph nodes in the retroperitoneum and porta hepatis. No residual hypermetabolic or enlarged lymph nodes are identified. 2. Low-grade diffuse skeletal metabolic activity is likely therapy related. 3. Coronary atherosclerosis. 4. 3 by 4 mm right middle lobe pulmonary nodule is stable, not appreciably hypermetabolic, but below sensitive PET-CT size thresholds. This may warrant surveillance.      01/11/2017 Imaging    MR Abdomen W WO Contrast IMPRESSION: 1. No acute findings within the abdomen. Previously noted liver metastasis has resolved in the interval. No new lesions.      02/01/2017 -  Chemotherapy    Maintenance therapy, Xeloda 20059m(1000 mg/m2)  q12h on day 1-14 every 21 days plus AVASTIN, starting 02/01/2017.  stopped after 12 days due to poor tolerance on 02/14/17  Changed to maintenance 5-FU and avastin every 2 weeks starting on 02/22/17        04/10/2017 Imaging    IMPRESSION: 1. Status post right hemicolectomy without findings for recurrent tumor. 2. No worrisome hepatic lesions. Treated disease with only a small residual low attenuation lesion in the right hepatic lobe. 3. No recurrent mediastinal or abdominal lymphadenopathy.       07/09/2017 PET scan    PET 07/09/17 IMPRESSION: 1. Status  post right hemicolectomy, without findings of hypermetabolic recurrent or metastatic disease. 2. A right middle lobe pulmonary nodule is unchanged. However, there is a right lower lobe 5 mm pulmonary nodule which is felt to be new and enlarged compared to prior exams. Suspicious for an isolated pulmonary metastasis. Consider CT follow-up at 3-6 months. 3. Age advanced coronary artery atherosclerosis. Recommend assessment of coronary risk factors and consideration of medical therapy. 4. Borderline ascending aortic dilatation, 4.0 cm        10/10/2017 Imaging    IMPRESSION: 1. Several (at least 10) subcentimeter pulmonary nodules scattered in both lungs, predominantly in the lower lobes, all new/increased, most compatible with enlarging pulmonary metastases, largest 8 mm in the right lower lobe. 2. No additional findings of new or progressive metastatic disease. No recurrent adenopathy. Stable small low-attenuation lesion in the inferior right liver lobe compatible with treated metastasis. No new liver metastases. 3. Stable ectatic 4.0 cm ascending thoracic aorta. Recommend annual imaging followup by CTA or MRA.  4. Stable mild splenomegaly.        HISTORY OF PRESENTING ILLNESS (06/01/2016):  ChLawarence Meek51.o. male is here because of his recently abdominal CT which  is highly suspicious for metastatic colon cancer. He is accompanied by his wife to my clinic today. He was referred by his primary care physician Dr. Pleas Koch.   He had colonoscopy in 2016 for mild rectal bleeding, he describe small amount fresh blood mixed with stool, he has no other constitutional symptoms at that time. He was referred to gastroenterologist Dr. Ladona Horns and underwent a colonoscopy in December 2016. The colonoscopy showed 2 sessile polyps ready between 3-5 mm in size located 20 cm (A, B) from the point of entry, polypectomy was performed. Pedunculated polyp was found in the ascending colon (C),  polypectomy was performed, and additional polyp (D) was found 30 cm from the point of entry, removed. The pathology reviewed tubular adenoma (A and B), and well differentiated adenocarcinoma arising from tubulovillous adenoma (C) and well differentiated adenocarcinoma arising from severe dysplasia to intramucosal carcinoma within tubular adenoma. Dr. Tye Maryland tried multiple times to reach patient, but patient sought they were calling him about the bill, and did not return the phone calls. He was not aware the cancer diagnosis until recently.   He started having diarrhea and vomiting in mid Dec 2017, and felt a "pop" in right side abdomen, he was seen at urgent care, and was treated with antiemetics, and IVF, lab tests were OK. Due to his persistent intermittent diarrhea and epigastric pain since then, he was seen by PCP and he eventually had CT abdomen and pelvis scan which showed a upper core lesion in the ascending colon, and I'll 1.7 cm lesion in the liver, highly suspicious for metastasis. He was referred to [place] for further evaluation.  He has lost 30 lbs in the past one month, has low appetite, eats a small meals 1-2 times a day. He has moderate fatigue, able to tolerate routine activities including his work, but feels exhausted at the end of study. He has occasional constipation, denies recent rectal bleeding.  CURRENT THERAPY:  FOLFIRI and Avastin every 2 weeks   INTERIM HISTORY:  Jesus Nevills is a 57 y.o. male who returns today for follow up and he presents with his wife.  He has been doing well overall.  He is little concerned that he is still slowly losing weight, he has lost 6 pounds in the past 3 months.  He does lose appetite after chemo, but he usually eats very well after he recovers.  He otherwise has been tolerating chemotherapy well, he does notice slightly worsening numbness on his toes, he tripped a few times, no fall.  Minimal numbness on his fingers.  No pain or other new  complaints.  He has been working full-time, tolerating well.  He saw his psychologist this morning for depression counseling, and he will be referred to see a psychiatrist soon.  He still gets angry easily, denies suicidal.  He was very pleasant when I saw him with his wife.  MEDICAL HISTORY:  Past Medical History:  Diagnosis Date  . Anxiety   . Cancer of ascending colon ([place])   . Seasonal allergies     SURGICAL HISTORY: Past Surgical History:  Procedure Laterality Date  . APPENDECTOMY  1992  . CATARACT EXTRACTION W/ INTRAOCULAR LENS IMPLANT Left 04/2016  . COLON SURGERY    . COLONOSCOPY W/ BIOPSIES AND POLYPECTOMY  04/2015  . INGUINAL HERNIA REPAIR Right 1982  . IR RADIOLOGIST EVAL & MGMT  01/16/2017  . KNEE ARTHROSCOPY W/ MENISCECTOMY Right 1998  . LAPAROSCOPIC RIGHT COLECTOMY N/A 06/14/2016   Procedure: LAPAROSCOPIC HAND ASSISTED  HEMICOLECTOMY AND SMALL BOWEL RESECTION.;  Surgeon: [name], MD;  Location: [place];  Service: General;  Laterality: N/A;  . LIVER BIOPSY Right 06/14/2016   Procedure: LIVER BIOPSY;  Surgeon: [name], MD;  Location: [place];  Service: General;  Laterality: Right;  Right Inferior Liver  . PORTACATH PLACEMENT N/A 07/06/2016   Procedure: INSERTION PORT-A-CATH;  Surgeon: [name], MD;  Location: [place];  Service: General;  Laterality: N/A;  . VASECTOMY  2005    SOCIAL HISTORY: Social History   Socioeconomic History  . Marital status: Married    Spouse name: Not on file  . Number of children: Not on file  . Years of education: Not on file  . Highest education level: Not on file  Occupational History  . Not on file  Social Needs  . Financial resource strain: Not on file  . Food insecurity:    Worry: Not on file    Inability: Not on file  . Transportation needs:    Medical: Not on file    Non-medical: Not on file  Tobacco Use  . Smoking status: Former Smoker    Years: 2.00    Types: Cigarettes    Last attempt to quit:  1990    Years since quitting: 29.6  . Smokeless tobacco: Never Used  Substance and Sexual Activity  . Alcohol use: Yes    Alcohol/week: 2.4 oz    Types: 4 Cans of beer per week    Comment: social, none since colon surgery  . Drug use: No    Comment: 06/15/2016 "nothing since college"  . Sexual activity: Yes  Lifestyle  . Physical activity:    Days per week: Not on file    Minutes per session: Not on file  . Stress: Not on file  Relationships  . Social connections:    Talks on phone: Not on file    Gets together: Not on file    Attends religious service: Not on file    Active member of club or organization: Not on file    Attends meetings of clubs or organizations: Not on file    Relationship status: Not on file  . Intimate partner violence:    Fear of current or ex partner: Not on file    Emotionally abused: Not on file    Physically abused: Not on file    Forced sexual activity: Not on file  Other Topics Concern  . Not on file  Social History Narrative  . Not on file   He is married. They have 3 boys, 59-12 yo. He works for a [employer], desk job.   FAMILY HISTORY: Family History  Problem Relation Age of Onset  . Cancer Mother        lung cancer  . Stroke Mother   . Hypertension Father   . CAD Father   . Cancer Maternal Grandfather        prostate cancer     ALLERGIES:  is allergic to penicillins and xeloda [capecitabine].  MEDICATIONS:  Current Outpatient Medications  Medication Sig Dispense Refill  . amLODipine (NORVASC) 10 MG tablet Take 1 tablet (10 mg total) by mouth daily. 30 tablet 2  . celecoxib (CELEBREX) 200 MG capsule Take 1 capsule (200 mg total) by mouth daily. 60 capsule 1  . dexamethasone (DECADRON) 4 MG tablet UP TO FIVE DAYS AFTER CHEMO (BEGIN DAY TWO) 20 tablet 0  . diphenoxylate-atropine (LOMOTIL) 2.5-0.025 MG tablet Take 2 tablets by mouth  4 (four) times daily as needed for diarrhea or loose stools. 30 tablet 0  .  lidocaine-prilocaine (EMLA) cream Apply to affected area once 30 g 3  . lisinopril (PRINIVIL,ZESTRIL) 10 MG tablet Take 1 tablet (10 mg total) by mouth daily. 90 tablet 2  . LORazepam (ATIVAN) 0.5 MG tablet Take 1 tablet (0.5 mg total) by mouth every 8 (eight) hours as needed (for nausea). 30 tablet 0  . Multiple Vitamin (MULTIVITAMIN WITH MINERALS) TABS tablet Take 1 tablet by mouth daily.    . ondansetron (ZOFRAN ODT) 8 MG disintegrating tablet Take 1 tablet (8 mg total) by mouth every 8 (eight) hours as needed for nausea or vomiting. 30 tablet 2  . prochlorperazine (COMPAZINE) 10 MG tablet Take 1 tablet (10 mg total) by mouth every 6 (six) hours as needed for nausea or vomiting. 30 tablet 3  . traMADol (ULTRAM) 50 MG tablet Take by mouth 2 (two) times daily.    . traMADol (ULTRAM) 50 MG tablet Take 1 tablet (50 mg total) by mouth every 6 (six) hours as needed. 30 tablet 0  . urea (CARMOL) 10 % cream Apply topically 3 (three) times daily. 71 g 1  . venlafaxine XR (EFFEXOR XR) 150 MG 24 hr capsule Take 1 capsule (150 mg total) by mouth daily with breakfast. 90 capsule 1  . venlafaxine XR (EFFEXOR-XR) 75 MG 24 hr capsule Take 1 capsule (75 mg total) by mouth daily with breakfast. 30 capsule 1   No current facility-administered medications for this visit.    Facility-Administered Medications Ordered in Other Visits  Medication Dose Route Frequency Provider Last Rate Last Dose  . 0.9 %  sodium chloride infusion   Intravenous Once Truitt Merle, MD      . atropine injection 0.5 mg  0.5 mg Intravenous Once Truitt Merle, MD      . bevacizumab (AVASTIN) 450 mg in sodium chloride 0.9 % 100 mL chemo infusion  5 mg/kg (Treatment Plan Recorded) Intravenous Once Truitt Merle, MD      . clindamycin (CLEOCIN) 900 mg in dextrose 5 % 50 mL IVPB  900 mg Intravenous 60 min Pre-Op [name], MD       And  . gentamicin (GARAMYCIN) 450 mg in dextrose 5 % 50 mL IVPB  5 mg/kg Intravenous 60 min Pre-Op [name], MD       . dexamethasone (DECADRON) injection 10 mg  10 mg Intravenous Once Harle Stanford., PA-C      . diphenoxylate-atropine (LOMOTIL) 2.5-0.025 MG per tablet 2 tablet  2 tablet Oral Once Harle Stanford., PA-C      . fluorouracil (ADRUCIL) 4,600 mg in sodium chloride 0.9 % 58 mL chemo infusion  2,200 mg/m2 (Treatment Plan Recorded) Intravenous 1 day or 1 dose Truitt Merle, MD      . fosaprepitant (EMEND) 150 mg, dexamethasone (DECADRON) 12 mg in sodium chloride 0.9 % 145 mL IVPB   Intravenous Once Truitt Merle, MD 454 mL/hr at 12/13/17 1338    . heparin lock flush 100 unit/mL  500 Units Intracatheter Once PRN Truitt Merle, MD      . irinotecan (CAMPTOSAR) 340 mg in dextrose 5 % 500 mL chemo infusion  160 mg/m2 (Treatment Plan Recorded) Intravenous Once Truitt Merle, MD      . leucovorin 840 mg in dextrose 5 % 250 mL infusion  400 mg/m2 (Treatment Plan Recorded) Intravenous Once Truitt Merle, MD      . sodium chloride flush (NS) 0.9 % injection  10 mL  10 mL Intracatheter PRN Truitt Merle, MD      . 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  PHYSICAL EXAMINATION:  ECOG PERFORMANCE STATUS: 1 - Symptomatic but completely ambulatory Blood pressure 140/100, heart rate 77, respirate 16, pulse ox 100%, weight 188 lb GENERAL:alert, no distress and  comfortable. SKIN: skin color, texture, turgor are normal, no rashes or significant lesions EYES: normal, conjunctiva are pink and non-injected, sclera clear OROPHARYNX:no exudate, no erythema and lips, buccal mucosa, and tongue normal   NECK: supple, thyroid normal size, non-tender, without nodularity LYMPH:  no palpable lymphadenopathy in the cervical, axillary or inguinal LUNGS: clear to auscultation and percussion with normal breathing effort HEART: regular rate & rhythm and no murmurs and no lower extremity edema ABDOMEN:abdomen soft, Surgical scar in the midline around the umbilical has healed well. non-tender and normal bowel sounds Musculoskeletal:no cyanosis of digits and no clubbing  PSYCH: alert & oriented x 3 with fluent speech NEURO: no focal motor/sensory deficits   LABORATORY DATA:  I have reviewed the data as listed CBC Latest Ref Rng & Units 12/13/2017 11/29/2017 11/14/2017  WBC 4.0 - 10.3 K/uL 4.7 5.9 2.8(L)  Hemoglobin 13.0 - 17.1 g/dL 13.0 13.1 12.9(L)  Hematocrit 38.4 - 49.9 % 38.9 38.5 38.7  Platelets 140 - 400 K/uL 145 181 175   CMP Latest Ref Rng & Units 12/13/2017 11/29/2017 11/14/2017  Glucose 70 - 99 mg/dL 96 101(H) 80  BUN 6 - 20 mg/dL _0 Creatinine 0.61 - 1.24 mg/dL 0.89 0.94 0.87  Sodium 135 - 145 mmol/L 141 139 141  Potassium 3.5 - 5.1 mmol/L 4.1 4.6 4.8  Chloride 98 - 111 mmol/L 104 105 108  CO2 22 - 32 mmol/L _1 Calcium 8.9 - 10.3 mg/dL 9.5 9.3 9.2  Total Protein 6.5 - 8.1 g/dL 7.1 6.9 6.7  Total Bilirubin 0.3 - 1.2 mg/dL 0.4 0.6 0.4  Alkaline Phos 38 - 126 U/L 64 61 60  AST 15 - 41 U/L _2 ALT 0 - 44 U/L _3 TUMOR MARKER Results for CREEDON, DANIELSKI (MRN [id number]) as of 11/13/2017 11:59  Ref. Range 07/12/2017 09:14 08/09/2017 10:47 09/13/2017 11:08 10/11/2017 10:40 10/25/2017 10:41  CEA ([facility]) Latest Ref Range: 0.00 - 5.00 ng/mL 3.26 3.81 4.59 4.95 5.26 (H)     PATHOLOGY   Diagnosis 06/14/2016 1. Colon, segmental  resection for tumor, Right Ascending Hemicolectomy and Small Bowel - INVASIVE WELL TO MODERATELY DIFFERENTIATED ADENOCARCINOMA. - TWO TUMOR FOCI MEASURING 7.5 CM AND 4.5 CM IN GREATEST DIMENSION. - TUMOR INVADES THROUGH FULL THICKNESS OF COLON, THROUGH THE SEROSA TO INVOLVE THE SMALL BOWEL. - MARGINS ARE NEGATIVE. - TWENTY FOUR OF SIXTY FOUR LYMPH NODES POSITIVE FOR METASTATIC ADENOCARCINOMA (24/64). - EXTRACAPSULAR EXTENSION IDENTIFIED - MULTIPLE SATELLITE TUMOR DEPOSITS PRESENT. - SEE  ONCOLOGY TEMPLATE. 2. Liver, needle/core biopsy, Right Inferior - POSITIVE FOR METASTATIC ADENOCARCINOMA.    RADIOGRAPHIC STUDIES: I have personally reviewed his outside CT scan from 05/29/2016 and agreed with the findings in the report. No results found.   MR Abdomen W WO Contrast 01/11/2017 IMPRESSION: 1. No acute findings within the abdomen. Previously noted liver metastasis has resolved in the interval. No new lesions.  CT CAP 09/04/16 IMPRESSION: Interval right colectomy. Stable small liver metastasis in the inferior right hepatic lobe. Stable mild porta hepatis and aortocaval lymphadenopathy. Stable mild mediastinal lymphadenopathy. No new or progressive metastatic disease identified within the chest, abdomen, or pelvis.  ASSESSMENT & PLAN:  Harjit Leider is a 57 y.o. Caucasian male, without significant past medical history, presented with rectal bleeding in December 2016, colonoscopy showed 4 polyps, 2 of them showed invasive adenocarcinoma, unfortunately he was not aware of the pathology findings and was not treated. He now presented with fatigue, weight loss, anorexia and abdominal pain  1. Right colon cancer with liver, node and probable lung metastasis, [id number], stage IV, MSI-stable, KRAS G12D mutation (+) -I previously reviewed her PET scan findings, which showed solitary liver metastasis, abdominal and possible thoracic node metastasis  -His liver biopsy confirmed  metastasis -Due to the bowel obstruction, he underwent upfront hemicolectomy. I previously reviewed his surgical pathology findings, which showed 2 primary right colon cancer, very locally advanced with 24 lymph nodes positive, surgical margins were negative. -I previously discussed his [facility] one genomic testing results, which showed care arrest mutation, and as high stable, low tumor burden. So he would not benefit from EGFR inhibitor, or immunotherapy alone. -The was started on palliative first-line chemotherapy FOLFOX on 07/13/16. Avastin was added from cycle 3 -He has solitary liver metastasis, but multiple mild hypermetabolic adenopathy in mediastinum and abdomen. We previously discussed the possible local therapy options, such as liver metastectomy or ablation if he has great response to chemo and his node metastasis resolves  -restaging CT from 09/04/2016 reviewed with pt, stable disease. Will continue chemo -he developed side effects from FOLFOX, and a worsening thrombocytopenia, despite reduced dose of oxaliplatin  -Due to his overall limited response to FOLFOX, and his wish to pursue surgery, I previously recommend him to change chemo to FOLFIRINOX starting with cycle 7 with low dose oxaliplatin, hopefully he will response better. -Due to his severe nausea and vomiting, anorexia, mild diarrhea, reduced his irinotecan dose from cycle 10  - PET scan from 12/19/2016 reviewed in person. He had near complete metabolic response from chemotherapy, post his liver metastasis and hypermetabolic lymph nodes are negative on PET scan with decreased size.  -Due to his worsening neuropathy, and good response to chemotherapy, I previously stopped oxaliplatin from cycle 11, and continue FOLFIRI and avastin   -Reviewed his restaging abdominal MRI findings, which showed no visible lesions in the liver. He has had a complete radiographic response -He was seen by interventional radiologist Dr. Earleen Newport, who does not  think he needed a liver ablation since the solid liver met has resolved after chemo. I agree with his plan.  -He was recently seen by surgeon Dr. Barry Dienes, who recommend maintenance chemotherapy for now, no indication for resection at this point given -Given his excellent response to chemotherapy, and the poor tolerance to intravenous chemotherapy, I recommended him to change treatments to maintenance chemotherapy therapy -He started Xeloda and Avastin maintenance therapy 02/01/17, due to very poor tolerance he stopped Xeloda 02/14/17 -he has been on  5-Fu pump and  avastin starting 02/22/17.  --We discussed his PET from 07/09/17 which shows a stable and a new small nodule in the lungs, indeterminate cause at this time. I encouraged him to continue with his current maintenance chemo regimen. Per Pt request I will give him a month of chemo break after 07/12/17 cycle of chemo.  -His restaging CT on 10/11/2017 unfortunately showed several subcentimeter pulmonary nodules scattered in both lungs, new or increased from prior scan, most compatible with worsening pulmonary metastasis. No other new lesions -I have changed his chemo back to FOLFIRI and Avastin, he is tolerating moderately well -He is overall tolerating chemotherapy well, he has slightly worsening peripheral neuropathy on his feet, grade 1, will continue monitoring. -Lab reviewed, adequate to proceed with cycle 5 chemo, plan to repeat staging CT scan after 6 cycles (next cycle)   2. Depression, Anxiety  -Pt has long-standing history of depression, has been on Prozac for decades, it was very well controlled in the past.   -He has become more depressed lately, due to the stress from cancer diagnosis, especially stage IV, with prolonged chemotherapy, stress from work and family responsibility, etc.  He has developed multiple symptoms, including depressed mood, insomnia, personality change, etc.  He also has suicidal ideas. -He has good family and social  support.   -We had a long discussion about depression management.  -I previously discussed the role of antidepressants, he has been on Prozac 40 mg for several years. I suggest changing to Effexor. He has weaned off Prozac in 2 weeks. Started Effexor 88m on week two of taper, will titrate dose if needed. -I also suggested he start attending GI Cancer support group, I had GI Navigator Dawn speak with him more about this and other resources. Dawn recommend him to see psychologist AEarl Lites he has met her twice, but does not feel it is very helpful. -His mood has improved, but he still has episodes of crying, and angry.  He is on Effexor 150 mg daily, will continue. -He is being referred to see a psychiatrist in the near future  3. Hypertension  -We previously discussed Avastin can cause hypertension -I previously started him on amlodipine, and increase dose to 10 mg daily -He does not want to follow-up with his primary care physician. -Previously, his blood pressure was noted to be elevated, especially diastolic. I previously added on lisinopril 10 mg daily for him -Continue Norvasc and Lisinopril for now while on Avastin -Will continue to monitor -BP better controlled with Lisinopril and amlodipine -His blood pressure was high in the infusion room, and gave him 0.1 mg clonidine on 07/12/17 -His blood pressure  is high again today, I recommend him to monitor his blood pressure at home, and increase lisinopril from 10 mg to 20 mg  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 -I previously recommend him to take oral iron supplement over-the-counter, potential side effects of constipation and gastric or discomfort or discussed with him. He stopped taking this before surgery. -I previously advised him to begin taking oral iron supplements again post surgery -Hg normal and stable at 13.1 and iron study has been normal lately. Will  continue to monitor.    5. Anorexia and weight loss secondary to chemo  -Secondary to underlying malignancy -I previously encouraged him to try nutritional supplement, such as boost or initial and try to eat more -He was encouraged to continue eating well and take  supplemental drinks as needed to better maintain weight.  -I previously discussed how his depression can effect his appetite and to make sure he is eating and drinking well.  -He has been seen by dietitian  -I again encouraged him to take a nutritional supplement  6. Peripheral neuropathy, G1  -Secondary to oxaliplatin, dose reduced and ultimately discontinued -Slightly worsened when he was on FOLFIRI, held Irinotecan beginning 01/04/17 -Improved some, his hand function normalized, no balance issue, discussed with patient this may take some time to resolve.  - I strongly encouraged him to take vitamin B-complex supplements -Overall slightly worse numbness on his toes, will continue to monitor.   7. Goal of care discussion  -We previously discussed the incurable nature of his cancer, and the overall poor prognosis, especially if he does not have good response to chemotherapy or progress on chemo -The patient understands the goal of care is palliative.  -He is full code now     Plan  -Lab reviewed, adequate for treatment, will proceed to cycle 5 FOLFIRI and Avastin today -Continue chemo every 2 weeks, I will see him back in 4 weeks.  Repeating CT scan of chest, abdomen and pelvis with contrast a few days before his office visit. -I will refill his ativan  -Increase lisinopril from 10 mg to 20 mg daily  No orders of the defined types were placed in this encounter.   All questions were answered.   The patient knows to call the clinic with any problems, questions or concerns.  I spent 20 minutes counseling the patient face to face. The total time spent in the appointment was 79mnutes and more than 50% was on  counseling.   YTruitt Merle 12/13/2017

## 2017-12-13 NOTE — Progress Notes (Signed)
5FU pump titrated to 3.19mL/hour to run over 45 hours per [name].

## 2017-12-13 NOTE — Progress Notes (Signed)
[name] assessed patient in treatment area d/t hypertension and c/o numbness and tingling in bilateral feet. Patient denies headache, chest pain, and dizziness. Okay to proceed with treatment today.  Verbal order given by [name] to titrate 5FU pump to complete at 1pm on 12/15/2017.

## 2017-12-13 NOTE — Patient Instructions (Signed)
Cancer Center Discharge Instructions for Patients Receiving Chemotherapy  Today you received the following chemotherapy agents: Avastin, Irinotecan, Leucovorin, 5FU  To help prevent nausea and vomiting after your treatment, we encourage you to take your nausea medication as directed.   If you develop nausea and vomiting that is not controlled by your nausea medication, call the clinic.   BELOW ARE SYMPTOMS THAT SHOULD BE REPORTED IMMEDIATELY:  *FEVER GREATER THAN 100.5 F  *CHILLS WITH OR WITHOUT FEVER  NAUSEA AND VOMITING THAT IS NOT CONTROLLED WITH YOUR NAUSEA MEDICATION  *UNUSUAL SHORTNESS OF BREATH  *UNUSUAL BRUISING OR BLEEDING  TENDERNESS IN MOUTH AND THROAT WITH OR WITHOUT PRESENCE OF ULCERS  *URINARY PROBLEMS  *BOWEL PROBLEMS  UNUSUAL RASH Items with * indicate a potential emergency and should be followed up as soon as possible.  Feel free to call the clinic should you have any questions or concerns. The clinic phone number is [phone].  Please show the CHEMO ALERT CARD at check-in to the Emergency Department and triage nurse.   

## 2017-12-13 NOTE — Progress Notes (Signed)
[facility] CLINICAL SOCIAL WORK PSYCHOSOCIAL ASSESSMENT   Practical Problems: no     Employment:  currently employed  Source of Income: Employment   Family Problems: yes  yes Concerns caring for family needs: yes    Emotional Problems: yes  Concerns of Adjustment to Diagnosis/Treatment: yes   Current Symptoms of Anxiety: yes   Current Symptoms of Depression: yes  Safety/Risk Concerns: yes   Mental Status Exam Orientation: person, place, and time Affect: emotionally labile Thought: normal      Spiritual/Religious: yes  Living Situation: with family  Functional Status: Independent                                                                                               Strengths and Barriers To Treatment:   Patient Coping Strengths:   Supportive Relationships, Family, Friends, Social worker and Spirituality                                                                                                Identified Problems/Needs and Barriers to Care:  Financial, Adjustment to Illness, Emotional/Behavioral/Cognitive, Coping/Communication, Body Image/Sexual Concerns and Family and social conflict/isolation  Counseling and Social Work Interventions and Recommendations:   Brief Counseling/Psychotherapy, Support Group/Group Counseling, Referral to community mental health provider and Referral to Psychiatrist   Impressions/Plan: Holiday representative met with patient and spouse in [place] office.  Patient's spouse requested counseling session today.  Patient's spouse reported (by phone) she is concerned regarding patient's emotional health- having outbursts and signs of depression.  Nicholas Lowery shared feelings of loss of control, anger, and hopelessness.  He reported stressors including family conflict, stressful work environment, financial stress, and difficulty coping with metastatic cancer diagnosis.  Patient reported drinking alcohol daily (did not specify amount).  He shared he enjoys mountain biking as a source of joy/destressor, but is not always able to mountain bike due to chemotherapy side effects.    Patient exhibited passive suicidal ideation, stated "it'd be easier if I were dead" [referring to family inheriting life insurance policy] and " I don't want to deal with this forever [referring to being on palliative cancer treatment].  Patient reports no active thoughts or plan- agreed to notify spouse and medical oncologist if he experiences active thoughts.  CSW recommended referral to [facility] for medication management and Nicholas Lowery, psychologist at [facility], for mental health counseling.  In addition, CSW encouraged patient to attend a support group [Living with Cancer group or GI group]. Patient agreed and CSW made referrals. CSW scheduled to meet patient 12/27/17 at 1000am in [place] office for follow up session.  CSW will continue to support patient in adjustment to cancer diagnosis.   Ander Purpura  Wallis Bamberg, LCSW

## 2017-12-14 ENCOUNTER — Telehealth: Payer: Self-pay | Admitting: Hematology

## 2017-12-14 NOTE — Telephone Encounter (Signed)
Appointments scheduled/ patient added to infusion log for 8/29 treatment/ pt declined AVS/Calendar (due to My Chart) PER 8/1 [facility]

## 2017-12-15 ENCOUNTER — Inpatient Hospital Stay: Payer: BLUE CROSS/BLUE SHIELD

## 2017-12-15 VITALS — BP 142/93 | HR 79 | Temp 97.6°F | Resp 20

## 2017-12-15 DIAGNOSIS — C182 Malignant neoplasm of ascending colon: Secondary | ICD-10-CM | POA: Diagnosis not present

## 2017-12-15 DIAGNOSIS — C189 Malignant neoplasm of colon, unspecified: Secondary | ICD-10-CM

## 2017-12-15 DIAGNOSIS — C787 Secondary malignant neoplasm of liver and intrahepatic bile duct: Principal | ICD-10-CM

## 2017-12-15 MED ORDER — HEPARIN SOD (PORK) LOCK FLUSH 100 UNIT/ML IV SOLN
500.0000 [IU] | Freq: Once | INTRAVENOUS | Status: AC | PRN
  Administered 2017-12-15: 500 [IU]
  Filled 2017-12-15: qty 5

## 2017-12-15 MED ORDER — SODIUM CHLORIDE 0.9% FLUSH
10.0000 mL | INTRAVENOUS | Status: DC | PRN
Start: 2017-12-15 — End: 2017-12-15
  Administered 2017-12-15: 10 mL
  Filled 2017-12-15: qty 10

## 2017-12-15 NOTE — Patient Instructions (Signed)
Implanted Port Home Guide An implanted port is a type of central line that is placed under the skin. Central lines are used to provide IV access when treatment or nutrition needs to be given through a person's veins. Implanted ports are used for long-term IV access. An implanted port may be placed because:  You need IV medicine that would be irritating to the small veins in your hands or arms.  You need long-term IV medicines, such as antibiotics.  You need IV nutrition for a long period.  You need frequent blood draws for lab tests.  You need dialysis.  Implanted ports are usually placed in the chest area, but they can also be placed in the upper arm, the abdomen, or the leg. An implanted port has two main parts:  Reservoir. The reservoir is round and will appear as a small, raised area under your skin. The reservoir is the part where a needle is inserted to give medicines or draw blood.  Catheter. The catheter is a thin, flexible tube that extends from the reservoir. The catheter is placed into a large vein. Medicine that is inserted into the reservoir goes into the catheter and then into the vein.  How will I care for my incision site? Do not get the incision site wet. Bathe or shower as directed by your health care provider. How is my port accessed? Special steps must be taken to access the port:  Before the port is accessed, a numbing cream can be placed on the skin. This helps numb the skin over the port site.  Your health care provider uses a sterile technique to access the port. ? Your health care provider must put on a mask and sterile gloves. ? The skin over your port is cleaned carefully with an antiseptic and allowed to dry. ? The port is gently pinched between sterile gloves, and a needle is inserted into the port.  Only "non-coring" port needles should be used to access the port. Once the port is accessed, a blood return should be checked. This helps ensure that the port  is in the vein and is not clogged.  If your port needs to remain accessed for a constant infusion, a clear (transparent) bandage will be placed over the needle site. The bandage and needle will need to be changed every week, or as directed by your health care provider.  Keep the bandage covering the needle clean and dry. Do not get it wet. Follow your health care provider's instructions on how to take a shower or bath while the port is accessed.  If your port does not need to stay accessed, no bandage is needed over the port.  What is flushing? Flushing helps keep the port from getting clogged. Follow your health care provider's instructions on how and when to flush the port. Ports are usually flushed with saline solution or a medicine called heparin. The need for flushing will depend on how the port is used.  If the port is used for intermittent medicines or blood draws, the port will need to be flushed: ? After medicines have been given. ? After blood has been drawn. ? As part of routine maintenance.  If a constant infusion is running, the port may not need to be flushed.  How long will my port stay implanted? The port can stay in for as long as your health care provider thinks it is needed. When it is time for the port to come out, surgery will be   done to remove it. The procedure is similar to the one performed when the port was put in. When should I seek immediate medical care? When you have an implanted port, you should seek immediate medical care if:  You notice a bad smell coming from the incision site.  You have swelling, redness, or drainage at the incision site.  You have more swelling or pain at the port site or the surrounding area.  You have a fever that is not controlled with medicine.  This information is not intended to replace advice given to you by your health care provider. Make sure you discuss any questions you have with your health care provider. Document  Released: 05/01/2005 Document Revised: 10/07/2015 Document Reviewed: 01/06/2013 Elsevier Interactive Patient Education  2017 [employer]  

## 2017-12-18 ENCOUNTER — Inpatient Hospital Stay: Payer: BLUE CROSS/BLUE SHIELD

## 2017-12-21 ENCOUNTER — Encounter: Payer: Self-pay | Admitting: *Deleted

## 2017-12-21 NOTE — Progress Notes (Signed)
CSW received confirmation from [facility] behavioral health, patient scheduled to see Dr. Apolonio Schneiders, psychologist, 01/16/18 at 3pm. Patient was notified [facility] psych contacted patient to schedule psychiatry appointment- patient wished to review psychiatrist bios before scheduling an appointment.  Maryjean Morn, MSW, LCSW, OSW-C Clinical Social Worker [facility] [phone]

## 2017-12-26 ENCOUNTER — Ambulatory Visit: Payer: BLUE CROSS/BLUE SHIELD

## 2017-12-26 ENCOUNTER — Other Ambulatory Visit: Payer: BLUE CROSS/BLUE SHIELD

## 2017-12-27 ENCOUNTER — Inpatient Hospital Stay: Payer: BLUE CROSS/BLUE SHIELD

## 2017-12-27 ENCOUNTER — Encounter: Payer: Self-pay | Admitting: *Deleted

## 2017-12-27 ENCOUNTER — Other Ambulatory Visit: Payer: Self-pay | Admitting: Hematology

## 2017-12-27 VITALS — BP 142/102 | HR 84 | Temp 97.9°F | Resp 20 | Wt 194.2 lb

## 2017-12-27 DIAGNOSIS — C787 Secondary malignant neoplasm of liver and intrahepatic bile duct: Principal | ICD-10-CM

## 2017-12-27 DIAGNOSIS — C189 Malignant neoplasm of colon, unspecified: Secondary | ICD-10-CM

## 2017-12-27 DIAGNOSIS — Z95828 Presence of other vascular implants and grafts: Secondary | ICD-10-CM

## 2017-12-27 DIAGNOSIS — C182 Malignant neoplasm of ascending colon: Secondary | ICD-10-CM | POA: Diagnosis not present

## 2017-12-27 LAB — COMPREHENSIVE METABOLIC PANEL
ALBUMIN: 3.8 g/dL (ref 3.5–5.0)
ALK PHOS: 57 U/L (ref 38–126)
ALT: 25 U/L (ref 0–44)
ANION GAP: 9 (ref 5–15)
AST: 21 U/L (ref 15–41)
BILIRUBIN TOTAL: 0.3 mg/dL (ref 0.3–1.2)
BUN: 13 mg/dL (ref 6–20)
CALCIUM: 9.1 mg/dL (ref 8.9–10.3)
CO2: 24 mmol/L (ref 22–32)
Chloride: 105 mmol/L (ref 98–111)
Creatinine, Ser: 0.84 mg/dL (ref 0.61–1.24)
GFR calc Af Amer: >60 mL/min (ref >60)
GLUCOSE: 84 mg/dL (ref 70–99)
Potassium: 4.3 mmol/L (ref 3.5–5.1)
Sodium: 138 mmol/L (ref 135–145)
TOTAL PROTEIN: 7 g/dL (ref 6.5–8.1)

## 2017-12-27 LAB — CBC WITH DIFFERENTIAL/PLATELET
BASOS ABS: 0 10*3/uL (ref 0.0–0.1)
BASOS PCT: 1 %
EOS PCT: 2 %
Eosinophils Absolute: 0.1 10*3/uL (ref 0.0–0.5)
HCT: 36.8 % — ABNORMAL LOW (ref 38.4–49.9)
Hemoglobin: 12.8 g/dL — ABNORMAL LOW (ref 13.0–17.1)
Lymphocytes Relative: 16 %
Lymphs Abs: 0.8 10*3/uL — ABNORMAL LOW (ref 0.9–3.3)
MCH: 34.5 pg — ABNORMAL HIGH (ref 27.2–33.4)
MCHC: 34.8 g/dL (ref 32.0–36.0)
MCV: 99.1 fL — ABNORMAL HIGH (ref 79.3–98.0)
MONO ABS: 0.6 10*3/uL (ref 0.1–0.9)
Monocytes Relative: 11 %
NEUTROS ABS: 3.6 10*3/uL (ref 1.5–6.5)
Neutrophils Relative %: 70 %
PLATELETS: 167 10*3/uL (ref 140–400)
RBC: 3.72 MIL/uL — AB (ref 4.20–5.82)
RDW: 16.1 % — AB (ref 11.0–14.6)
WBC: 5.1 10*3/uL (ref 4.0–10.3)

## 2017-12-27 LAB — TOTAL PROTEIN, URINE DIPSTICK: Protein, ur: NEGATIVE mg/dL

## 2017-12-27 MED ORDER — LEUCOVORIN CALCIUM INJECTION 350 MG
400.0000 mg/m2 | Freq: Once | INTRAMUSCULAR | Status: AC
  Administered 2017-12-27: 840 mg via INTRAVENOUS
  Filled 2017-12-27: qty 42

## 2017-12-27 MED ORDER — SODIUM CHLORIDE 0.9% FLUSH
10.0000 mL | Freq: Once | INTRAVENOUS | Status: AC
  Administered 2017-12-27: 10 mL
  Filled 2017-12-27: qty 10

## 2017-12-27 MED ORDER — SODIUM CHLORIDE 0.9 % IV SOLN
5.0000 mg/kg | Freq: Once | INTRAVENOUS | Status: AC
  Administered 2017-12-27: 450 mg via INTRAVENOUS
  Filled 2017-12-27: qty 16

## 2017-12-27 MED ORDER — PALONOSETRON HCL INJECTION 0.25 MG/5ML
0.2500 mg | Freq: Once | INTRAVENOUS | Status: AC
  Administered 2017-12-27: 0.25 mg via INTRAVENOUS

## 2017-12-27 MED ORDER — DEXTROSE 5 % IV SOLN
160.0000 mg/m2 | Freq: Once | INTRAVENOUS | Status: AC
  Administered 2017-12-27: 340 mg via INTRAVENOUS
  Filled 2017-12-27: qty 15

## 2017-12-27 MED ORDER — SODIUM CHLORIDE 0.9 % IV SOLN
Freq: Once | INTRAVENOUS | Status: AC
  Administered 2017-12-27: 14:00:00 via INTRAVENOUS
  Filled 2017-12-27: qty 250

## 2017-12-27 MED ORDER — SODIUM CHLORIDE 0.9 % IV SOLN
Freq: Once | INTRAVENOUS | Status: AC
  Administered 2017-12-27: 13:00:00 via INTRAVENOUS
  Filled 2017-12-27: qty 5

## 2017-12-27 MED ORDER — ATROPINE SULFATE 1 MG/ML IJ SOLN
0.5000 mg | Freq: Once | INTRAMUSCULAR | Status: AC
  Administered 2017-12-27: 0.5 mg via INTRAVENOUS

## 2017-12-27 MED ORDER — FLUOROURACIL CHEMO INJECTION 5 GM/100ML
2200.0000 mg/m2 | INTRAVENOUS | Status: DC
  Administered 2017-12-27: 4600 mg via INTRAVENOUS
  Filled 2017-12-27: qty 92

## 2017-12-27 MED ORDER — SODIUM CHLORIDE 0.9 % IV SOLN
Freq: Once | INTRAVENOUS | Status: AC
  Administered 2017-12-27: 12:00:00 via INTRAVENOUS
  Filled 2017-12-27: qty 250

## 2017-12-27 NOTE — Patient Instructions (Signed)
[place] Discharge Instructions for Patients Receiving Chemotherapy  Today you received the following chemotherapy agents Adrucil, Leucovorin, Avastin, Irinotecan  To help prevent nausea and vomiting after your treatment, we encourage you to take your nausea medication as directed   If you develop nausea and vomiting that is not controlled by your nausea medication, call the clinic.   BELOW ARE SYMPTOMS THAT SHOULD BE REPORTED IMMEDIATELY:  *FEVER GREATER THAN 100.5 F  *CHILLS WITH OR WITHOUT FEVER  NAUSEA AND VOMITING THAT IS NOT CONTROLLED WITH YOUR NAUSEA MEDICATION  *UNUSUAL SHORTNESS OF BREATH  *UNUSUAL BRUISING OR BLEEDING  TENDERNESS IN MOUTH AND THROAT WITH OR WITHOUT PRESENCE OF ULCERS  *URINARY PROBLEMS  *BOWEL PROBLEMS  UNUSUAL RASH Items with * indicate a potential emergency and should be followed up as soon as possible.  Feel free to call the clinic should you have any questions or concerns. The clinic phone number is [phone].  Please show the [place] at check-in to the Emergency Department and triage nurse.

## 2017-12-27 NOTE — Progress Notes (Signed)
[place] Work  Clinical Social Work met with patient for counseling session follow up.  Patient discussed family relationships, work stressors, and balancing diagnosis.  CSW encouraged patient to think about goals he'd like to work on with Dr. Cheryln Manly at upcoming appointment.  CSW will follow up with him after appointment.   Gwinda Maine, LCSW  Clinical Social Worker [facility]

## 2017-12-29 ENCOUNTER — Inpatient Hospital Stay: Payer: BLUE CROSS/BLUE SHIELD

## 2017-12-29 VITALS — BP 133/97 | HR 90 | Temp 97.8°F | Resp 18

## 2017-12-29 DIAGNOSIS — C182 Malignant neoplasm of ascending colon: Secondary | ICD-10-CM | POA: Diagnosis not present

## 2017-12-29 DIAGNOSIS — C189 Malignant neoplasm of colon, unspecified: Secondary | ICD-10-CM

## 2017-12-29 DIAGNOSIS — Z95828 Presence of other vascular implants and grafts: Secondary | ICD-10-CM

## 2017-12-29 DIAGNOSIS — C787 Secondary malignant neoplasm of liver and intrahepatic bile duct: Secondary | ICD-10-CM

## 2017-12-29 MED ORDER — HEPARIN SOD (PORK) LOCK FLUSH 100 UNIT/ML IV SOLN
500.0000 [IU] | Freq: Once | INTRAVENOUS | Status: AC
  Administered 2017-12-29: 500 [IU]
  Filled 2017-12-29: qty 5

## 2017-12-29 MED ORDER — SODIUM CHLORIDE 0.9% FLUSH
10.0000 mL | Freq: Once | INTRAVENOUS | Status: AC
  Administered 2017-12-29: 10 mL
  Filled 2017-12-29: qty 10

## 2017-12-29 NOTE — Patient Instructions (Signed)

## 2018-01-07 ENCOUNTER — Encounter: Payer: Self-pay | Admitting: Hematology

## 2018-01-07 MED ORDER — PALONOSETRON HCL INJECTION 0.25 MG/5ML
INTRAVENOUS | Status: AC
  Filled 2018-01-07: qty 5

## 2018-01-08 ENCOUNTER — Ambulatory Visit (HOSPITAL_COMMUNITY)
Admission: RE | Admit: 2018-01-08 | Discharge: 2018-01-08 | Disposition: A | Discharge status: Home / Self Care | Payer: BLUE CROSS/BLUE SHIELD | Source: Ambulatory Visit | Attending: Hematology | Admitting: Hematology

## 2018-01-08 ENCOUNTER — Encounter (HOSPITAL_COMMUNITY): Payer: Self-pay

## 2018-01-08 DIAGNOSIS — C7800 Secondary malignant neoplasm of unspecified lung: Secondary | ICD-10-CM | POA: Diagnosis not present

## 2018-01-08 DIAGNOSIS — I712 Thoracic aortic aneurysm, without rupture: Secondary | ICD-10-CM | POA: Diagnosis not present

## 2018-01-08 DIAGNOSIS — C189 Malignant neoplasm of colon, unspecified: Secondary | ICD-10-CM | POA: Diagnosis present

## 2018-01-08 DIAGNOSIS — C787 Secondary malignant neoplasm of liver and intrahepatic bile duct: Secondary | ICD-10-CM | POA: Diagnosis not present

## 2018-01-08 MED ORDER — IOHEXOL 300 MG/ML  SOLN
100.0000 mL | Freq: Once | INTRAMUSCULAR | Status: AC | PRN
  Administered 2018-01-08: 100 mL via INTRAVENOUS

## 2018-01-08 NOTE — Progress Notes (Signed)
[place]  [phone] [phone]  Clinic Follow Up Note  Patient Care Team: Curlene Labrum, MD as PCP - General (Family Medicine)   Date of Service:  01/10/2018   CHIEF COMPLAINTS:  Follow up metastatic right colon cancer   Oncology History   Cancer Staging Metastatic colon cancer to liver [facility]) Staging form: Colon and Rectum, AJCC 8th Edition - Clinical stage from 06/01/2016: Stage IVA (cTX, cNX, pM1a) - Signed by Truitt Merle, MD on 07/04/2016 - Pathologic stage from 06/14/2016: Stage IVA (pT4b(m), pN2b, pM1a) - Signed by Truitt Merle, MD on 07/04/2016       Metastatic colon cancer to liver ([place])   04/2015 Procedure    Colonoscopy by Dr. Ladona Horns. It showed showed 2 sessile polyps ready between 3-5 mm in size located 20 cm (A, B) from the point of entry, polypectomy was performed. Pedunculated polyp was found in the ascending colon (C), polypectomy was performed, and additional polyp (D) was found 30 cm from the point of entry, removed    04/2015 Pathology Results    tubular adenoma (A and B), and well differentiated adenocarcinoma arising from tubulovillous adenoma (C) and well differentiated adenocarcinoma arising from severe dysplasia to intramucosal carcinoma within tubular adenoma.     04/2015 Initial Diagnosis    Metastatic colon cancer to liver ([place])    05/29/2016 Imaging    CT abdomen and pelvis with contrast showed an apple core like stricture in right colon just above the cecum, measuring 3.2 cm in lengths. This is highly suspicious for malignancy. Small lymph node a noticed he had adjacent mesentery, measuring 8 mm. There is a low-density lesion within the inferior aspect of the right hepatic lobe measuring 1.7 cm, suspicious for metastasis.     06/06/2016 Tumor Marker    CEA 9.99    06/08/2016 PET scan    IMPRESSION: Approximately 3 cm hypermetabolic mass in the ascending colon, consistent with primary colon carcinoma. This mass  results in colonic obstruction and small bowel dilatation. Additional areas of hypermetabolic wall thickening in the cecum may represent other sites of colon carcinoma or colitis. Mild hypermetabolic lymphadenopathy in right pericolonic region, porta hepatis, and aortocaval space, consistent with metastatic disease. Mild hypermetabolic mediastinal lymphadenopathy also seen, and thoracic lymph node metastases cannot be excluded. Solitary hypermetabolic focus in inferior right hepatic lobe, consistent with liver metastasis. Consider abdomen MRI without and with contrast for further evaluation.    06/14/2016 Surgery    Hand assisted right hemicolectomy and small bowel resection for colon cancer, liver biopsy, by Dr. Barry Dienes    06/14/2016 Pathology Results    Right hemicolectomy showed invasive well to moderately differentiated adenocarcinoma, 2 foci measuring 7.5 cm and 4.5 cm, tumor invades through full thickness of colon, to the seroma and involve the Small Bowel, Surgical Margins Were Negative, 24 Out Of 64 Lymph Nodes Were Positive, Extracapsular Extension Identified, Multiple Satellite Tumor Deposits Present, Liver Biopsy Showed Metastatic Adenocarcinoma.      06/14/2016 Miscellaneous    Tumor MMR normal, MSI stable     06/14/2016 Miscellaneous    Foundation one genomic testing showed K-ras G12 D mutation, APC and TP53 mutation. No BRAF and NRAS mutation. MSI-stable, tumor burden low.    07/06/2016 Tumor Marker    CEA 13.69    07/13/2016 - 01/18/2017 Chemotherapy    mFOLFOX, every 2 weeks, started on 07/14/2015, Avastin added from cycle 3  Oxaliplatin dose to '60mg'$ /m2 due to side effects and some cytopenia on  09/21/16  Changed to FOLFIRINOX starting cycle 7 and Reduced  Due to neuropathy hold Oxaliplatin and add Irinotecan with neulasta on day 3 starting with cycle 7 Add low dose Oxaliplatin with cycle 8.  Due to his worsening neuropathy, and good response to chemotherapy, I previously stopped  oxaliplatin from cycle 11, and continue FOLFIRI and avastin       07/27/2016 Tumor Marker    CEA 15.85    09/04/2016 Imaging    Ct C/A/P W Contrast IMPRESSION: Interval right colectomy. Stable small liver metastasis in the inferior right hepatic lobe. Stable mild porta hepatis and aortocaval lymphadenopathy. Stable mild mediastinal lymphadenopathy. No new or progressive metastatic disease identified within the chest, abdomen, or pelvis.    12/19/2016 PET scan    IMPRESSION: 1. Right hemicolectomy, with resolution of the prior hypermetabolic activity inferiorly in the right hepatic lobe, in several mediastinal lymph nodes, and in lymph nodes in the retroperitoneum and porta hepatis. No residual hypermetabolic or enlarged lymph nodes are identified. 2. Low-grade diffuse skeletal metabolic activity is likely therapy related. 3. Coronary atherosclerosis. 4. 3 by 4 mm right middle lobe pulmonary nodule is stable, not appreciably hypermetabolic, but below sensitive PET-CT size thresholds. This may warrant surveillance.    01/11/2017 Imaging    MR Abdomen W WO Contrast IMPRESSION: 1. No acute findings within the abdomen. Previously noted liver metastasis has resolved in the interval. No new lesions.    02/01/2017 -  Chemotherapy    Maintenance therapy, Xeloda '2000mg'$  (1000 mg/m2)  q12h on day 1-14 every 21 days plus AVASTIN, starting 02/01/2017.  stopped after 12 days due to poor tolerance on 02/14/17  Changed to maintenance 5-FU and avastin every 2 weeks starting on 02/22/17      04/10/2017 Imaging    IMPRESSION: 1. Status post right hemicolectomy without findings for recurrent tumor. 2. No worrisome hepatic lesions. Treated disease with only a small residual low attenuation lesion in the right hepatic lobe. 3. No recurrent mediastinal or abdominal lymphadenopathy.     07/09/2017 PET scan    PET 07/09/17 IMPRESSION: 1. Status post right hemicolectomy, without findings  of hypermetabolic recurrent or metastatic disease. 2. A right middle lobe pulmonary nodule is unchanged. However, there is a right lower lobe 5 mm pulmonary nodule which is felt to be new and enlarged compared to prior exams. Suspicious for an isolated pulmonary metastasis. Consider CT follow-up at 3-6 months. 3. Age advanced coronary artery atherosclerosis. Recommend assessment of coronary risk factors and consideration of medical therapy. 4. Borderline ascending aortic dilatation, 4.0 cm      10/10/2017 Imaging    IMPRESSION: 1. Several (at least 10) subcentimeter pulmonary nodules scattered in both lungs, predominantly in the lower lobes, all new/increased, most compatible with enlarging pulmonary metastases, largest 8 mm in the right lower lobe. 2. No additional findings of new or progressive metastatic disease. No recurrent adenopathy. Stable small low-attenuation lesion in the inferior right liver lobe compatible with treated metastasis. No new liver metastases. 3. Stable ectatic 4.0 cm ascending thoracic aorta. Recommend annual imaging followup by CTA or MRA.  4. Stable mild splenomegaly.     01/08/2018 Imaging    01/08/2018 CT CAP IMPRESSION: 1. Progressive hepatic and pulmonary metastatic disease. 2. Ascending Aortic aneurysm NOS (ICD10-I71.9).    01/08/2018 Progression    01/08/2018 CT CAP IMPRESSION: 1. Progressive hepatic and pulmonary metastatic disease. 2. Ascending Aortic aneurysm NOS (ICD10-I71.9).     HISTORY OF PRESENTING ILLNESS (06/01/2016):  Nicholas Lowery 57 y.o.  male is here because of his recently abdominal CT which is highly suspicious for metastatic colon cancer. He is accompanied by his wife to my clinic today. He was referred by his primary care physician Dr. Pleas Koch.   He had colonoscopy in 2016 for mild rectal bleeding, he describe small amount fresh blood mixed with stool, he has no other constitutional symptoms at that time. He was referred to  gastroenterologist Dr. Ladona Horns and underwent a colonoscopy in December 2016. The colonoscopy showed 2 sessile polyps ready between 3-5 mm in size located 20 cm (A, B) from the point of entry, polypectomy was performed. Pedunculated polyp was found in the ascending colon (C), polypectomy was performed, and additional polyp (D) was found 30 cm from the point of entry, removed. The pathology reviewed tubular adenoma (A and B), and well differentiated adenocarcinoma arising from tubulovillous adenoma (C) and well differentiated adenocarcinoma arising from severe dysplasia to intramucosal carcinoma within tubular adenoma. Dr. Tye Maryland tried multiple times to reach patient, but patient sought they were calling him about the bill, and did not return the phone calls. He was not aware the cancer diagnosis until recently.   He started having diarrhea and vomiting in mid Dec 2017, and felt a "pop" in right side abdomen, he was seen at urgent care, and was treated with antiemetics, and IVF, lab tests were OK. Due to his persistent intermittent diarrhea and epigastric pain since then, he was seen by PCP and he eventually had CT abdomen and pelvis scan which showed a upper core lesion in the ascending colon, and I'll 1.7 cm lesion in the liver, highly suspicious for metastasis. He was referred to [place] for further evaluation.  He has lost 30 lbs in the past one month, has low appetite, eats a small meals 1-2 times a day. He has moderate fatigue, able to tolerate routine activities including his work, but feels exhausted at the end of study. He has occasional constipation, denies recent rectal bleeding.  CURRENT THERAPY:  FOLFIRI and Avastin every 2 weeks, change FOLFOX and Avastin on 01/10/2018 due to disease progression   INTERIM HISTORY:  Nicholas Lowery is a 57 y.o. male who returns today for follow up. He is here with his wife. His BP is high at 145/99, he denies HA or CP. His diastolic BP reading at home are usually in  the 90s. He is amlodipine and lisinopril. He is vert stressed and emotional about his last CT scan result. He also states that he is drinking more alcohol and thinks that his drinking might contribute to his BP increasing.    MEDICAL HISTORY:  Past Medical History:  Diagnosis Date  . Anxiety   . Cancer of ascending colon ([place])   . Seasonal allergies     SURGICAL HISTORY: Past Surgical History:  Procedure Laterality Date  . APPENDECTOMY  1992  . CATARACT EXTRACTION W/ INTRAOCULAR LENS IMPLANT Left 04/2016  . COLON SURGERY    . COLONOSCOPY W/ BIOPSIES AND POLYPECTOMY  04/2015  . INGUINAL HERNIA REPAIR Right 1982  . IR RADIOLOGIST EVAL & MGMT  01/16/2017  . KNEE ARTHROSCOPY W/ MENISCECTOMY Right 1998  . LAPAROSCOPIC RIGHT COLECTOMY N/A 06/14/2016   Procedure: LAPAROSCOPIC HAND ASSISTED HEMICOLECTOMY AND SMALL BOWEL RESECTION.;  Surgeon: [name], MD;  Location: [facility];  Service: General;  Laterality: N/A;  . LIVER BIOPSY Right 06/14/2016   Procedure: LIVER BIOPSY;  Surgeon: [name], MD;  Location: [facility];  Service: General;  Laterality: Right;  Right Inferior  Liver  . PORTACATH PLACEMENT N/A 07/06/2016   Procedure: INSERTION PORT-A-CATH;  Surgeon: [name], MD;  Location: [facility];  Service: General;  Laterality: N/A;  . VASECTOMY  2005    SOCIAL HISTORY: Social History   Socioeconomic History  . Marital status: Married    Spouse name: Not on file  . Number of children: Not on file  . Years of education: Not on file  . Highest education level: Not on file  Occupational History  . Not on file  Social Needs  . Financial resource strain: Not on file  . Food insecurity:    Worry: Not on file    Inability: Not on file  . Transportation needs:    Medical: Not on file    Non-medical: Not on file  Tobacco Use  . Smoking status: Former Smoker    Years: 2.00    Types: Cigarettes    Last attempt to quit: 1990    Years since quitting: 29.6  . Smokeless  tobacco: Never Used  Substance and Sexual Activity  . Alcohol use: Yes    Alcohol/week: 4.0 standard drinks    Types: 4 Cans of beer per week    Comment: social, none since colon surgery  . Drug use: No    Comment: 06/15/2016 "nothing since college"  . Sexual activity: Yes  Lifestyle  . Physical activity:    Days per week: Not on file    Minutes per session: Not on file  . Stress: Not on file  Relationships  . Social connections:    Talks on phone: Not on file    Gets together: Not on file    Attends religious service: Not on file    Active member of club or organization: Not on file    Attends meetings of clubs or organizations: Not on file    Relationship status: Not on file  . Intimate partner violence:    Fear of current or ex partner: Not on file    Emotionally abused: Not on file    Physically abused: Not on file    Forced sexual activity: Not on file  Other Topics Concern  . Not on file  Social History Narrative  . Not on file   He is married. They have 3 boys, 74-12 yo. He works for a [employer], desk job.   FAMILY HISTORY: Family History  Problem Relation Age of Onset  . Cancer Mother        lung cancer  . Stroke Mother   . Hypertension Father   . CAD Father   . Cancer Maternal Grandfather        prostate cancer     ALLERGIES:  is allergic to penicillins and xeloda [capecitabine].  MEDICATIONS:  Current Outpatient Medications  Medication Sig Dispense Refill  . amLODipine (NORVASC) 10 MG tablet Take 1 tablet (10 mg total) by mouth daily. 30 tablet 2  . celecoxib (CELEBREX) 200 MG capsule Take 1 capsule (200 mg total) by mouth daily. 60 capsule 1  . dexamethasone (DECADRON) 4 MG tablet UP TO FIVE DAYS AFTER CHEMO (BEGIN DAY TWO) 20 tablet 0  . lidocaine-prilocaine (EMLA) cream Apply to affected area once 30 g 3  . lisinopril (PRINIVIL,ZESTRIL) 10 MG tablet Take 1 tablet (10 mg total) by mouth daily. 90 tablet 2  . LORazepam (ATIVAN) 0.5 MG tablet  Take 1 tablet (0.5 mg total) by mouth every 8 (eight) hours as needed (for nausea). 30 tablet 0  .  Multiple Vitamin (MULTIVITAMIN WITH MINERALS) TABS tablet Take 1 tablet by mouth daily.    . ondansetron (ZOFRAN ODT) 8 MG disintegrating tablet Take 1 tablet (8 mg total) by mouth every 8 (eight) hours as needed for nausea or vomiting. 30 tablet 2  . prochlorperazine (COMPAZINE) 10 MG tablet Take 1 tablet (10 mg total) by mouth every 6 (six) hours as needed for nausea or vomiting. 30 tablet 3  . traMADol (ULTRAM) 50 MG tablet Take by mouth 2 (two) times daily.    . traMADol (ULTRAM) 50 MG tablet Take 1 tablet (50 mg total) by mouth every 6 (six) hours as needed. 30 tablet 0  . urea (CARMOL) 10 % cream Apply topically 3 (three) times daily. 71 g 1  . venlafaxine XR (EFFEXOR XR) 150 MG 24 hr capsule Take 1 capsule (150 mg total) by mouth daily with breakfast. 90 capsule 1  . venlafaxine XR (EFFEXOR-XR) 75 MG 24 hr capsule Take 1 capsule (75 mg total) by mouth daily with breakfast. 30 capsule 1  . diphenoxylate-atropine (LOMOTIL) 2.5-0.025 MG tablet Take 2 tablets by mouth 4 (four) times daily as needed for diarrhea or loose stools. (Patient not taking: Reported on 01/10/2018) 30 tablet 0   No current facility-administered medications for this visit.    Facility-Administered Medications Ordered in Other Visits  Medication Dose Route Frequency Provider Last Rate Last Dose  . clindamycin (CLEOCIN) 900 mg in dextrose 5 % 50 mL IVPB  900 mg Intravenous 60 min Pre-Op [name], MD       And  . gentamicin (GARAMYCIN) 450 mg in dextrose 5 % 50 mL IVPB  5 mg/kg Intravenous 60 min Pre-Op [name], MD      . dexamethasone (DECADRON) injection 10 mg  10 mg Intravenous Once Harle Stanford., PA-C      . diphenoxylate-atropine (LOMOTIL) 2.5-0.025 MG per tablet 2 tablet  2 tablet Oral Once Harle Stanford., PA-C      . heparin lock flush 100 unit/mL  500 Units Intracatheter Once PRN Truitt Merle, MD      . sodium  chloride flush (NS) 0.9 % injection 10 mL  10 mL Intracatheter PRN Truitt Merle, MD      . 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  PHYSICAL EXAMINATION:  ECOG PERFORMANCE STATUS: 1 - Symptomatic but completely ambulatory Vitals:   01/10/18 0832 01/10/18 0847  BP: (!) 145/101 (!) 145/99  Pulse: 99 100  Resp: 18   Temp: (!) 97.4 F (36.3 C)   SpO2: 100%     GENERAL:alert, no distress and comfortable. SKIN: skin color, texture, turgor are normal, no rashes or significant lesions EYES: normal, conjunctiva are pink and non-injected, sclera clear OROPHARYNX:no exudate, no erythema and lips, buccal mucosa, and tongue normal  NECK: supple, thyroid normal size, non-tender, without nodularity LYMPH:  no palpable lymphadenopathy  in the cervical, axillary or inguinal LUNGS: clear to auscultation and percussion with normal breathing effort HEART: regular rate & rhythm and no murmurs and no lower extremity edema ABDOMEN:abdomen soft, Surgical scar in the midline around the umbilical has healed well. non-tender and normal bowel sounds Musculoskeletal:no cyanosis of digits and no clubbing  PSYCH: alert & oriented x 3 with fluent speech (+) stressed out and emotional NEURO: no focal motor/sensory deficits   LABORATORY DATA:  I  have reviewed the data as listed CBC Latest Ref Rng & Units 01/10/2018 12/27/2017 12/13/2017  WBC 4.0 - 10.3 K/uL 4.0 5.1 4.7  Hemoglobin 13.0 - 17.1 g/dL 13.1 12.8(L) 13.0  Hematocrit 38.4 - 49.9 % 38.6 36.8(L) 38.9  Platelets 140 - 400 K/uL 159 167 145   CMP Latest Ref Rng & Units 01/10/2018 12/27/2017 12/13/2017  Glucose 70 - 99 mg/dL 105(H) 84 96  BUN 6 - 20 mg/dL '9 13 13  '$ Creatinine 0.61 - 1.24 mg/dL 1.01 0.84 0.89  Sodium 135 - 145 mmol/L 141 138 141  Potassium 3.5 - 5.1 mmol/L 4.3 4.3 4.1  Chloride 98 - 111 mmol/L 105 105 104  CO2 22 - 32 mmol/L '22 24 28  '$ Calcium 8.9 - 10.3 mg/dL 9.5 9.1 9.5  Total Protein 6.5 - 8.1 g/dL 7.1 7.0 7.1  Total Bilirubin 0.3 - 1.2 mg/dL 0.3 0.3 0.4  Alkaline Phos 38 - 126 U/L 65 57 64  AST 15 - 41 U/L '20 21 19  '$ ALT 0 - 44 U/L '22 25 22   '$ TUMOR MARKER Results for GURNIE, DURIS (MRN [id number]) as of 01/08/2018 11:33  Ref. Range 09/13/2017 11:08 10/11/2017 10:40 10/25/2017 10:41 11/14/2017 09:30 12/13/2017 11:48  CEA ([facility]) Latest Ref Range: 0.00 - 5.00 ng/mL 4.59 4.95 5.26 (H) 4.82 6.93 (H)    PATHOLOGY   Diagnosis 06/14/2016 1. Colon, segmental resection for tumor, Right Ascending Hemicolectomy and Small Bowel - INVASIVE WELL TO MODERATELY DIFFERENTIATED ADENOCARCINOMA. - TWO TUMOR FOCI MEASURING 7.5 CM AND 4.5 CM IN GREATEST DIMENSION. - TUMOR INVADES THROUGH FULL THICKNESS OF COLON, THROUGH THE SEROSA TO INVOLVE THE SMALL BOWEL. - MARGINS ARE NEGATIVE. - TWENTY FOUR OF SIXTY FOUR LYMPH NODES POSITIVE FOR METASTATIC ADENOCARCINOMA (24/64). - EXTRACAPSULAR EXTENSION IDENTIFIED - MULTIPLE SATELLITE TUMOR DEPOSITS PRESENT. - SEE ONCOLOGY TEMPLATE. 2. Liver, needle/core biopsy, Right Inferior - POSITIVE FOR METASTATIC ADENOCARCINOMA.    RADIOGRAPHIC STUDIES: I have personally reviewed his outside CT scan from 05/29/2016 and agreed with the findings in the report.  01/08/2018 CT CAP IMPRESSION: 1. Progressive hepatic and pulmonary  metastatic disease. 2. Ascending Aortic aneurysm NOS (ICD10-I71.9).   MR Abdomen W WO Contrast 01/11/2017 IMPRESSION: 1. No acute findings within the abdomen. Previously noted liver metastasis has resolved in the interval. No new lesions.  CT CAP 09/04/16 IMPRESSION: Interval right colectomy. Stable small liver metastasis in the inferior right hepatic lobe. Stable mild porta hepatis and aortocaval lymphadenopathy. Stable mild mediastinal lymphadenopathy. No new or progressive metastatic disease identified within the chest, abdomen, or pelvis.  ASSESSMENT & PLAN:   Nicholas Lowery is a 57 y.o. Caucasian male, without significant past medical history, presented with rectal bleeding in December 2016, colonoscopy showed 4 polyps, 2 of them showed invasive adenocarcinoma, unfortunately he was not aware of the pathology findings and was not treated. He now presented with fatigue, weight loss, anorexia and abdominal pain  1. Right colon cancer with liver, node and probable lung metastasis, [id number], stage IV, MSI-stable, KRAS G12D mutation (+) -I previously  reviewed her PET scan findings, which showed solitary liver metastasis, abdominal and possible thoracic node metastasis  -His liver biopsy confirmed metastasis -Due to the bowel obstruction, he underwent upfront hemicolectomy. I previously reviewed his surgical pathology findings, which showed 2 primary right colon cancer, very locally advanced with 24 lymph nodes positive, surgical margins were negative. -I previously discussed his [facility] one genomic testing results, which showed care arrest mutation, and as high stable, low tumor burden. So he would not benefit from EGFR inhibitor, or immunotherapy alone. -The was started on palliative first-line chemotherapy FOLFOX on 07/13/16. Avastin was added from cycle 3 -He has solitary liver metastasis, but multiple mild hypermetabolic adenopathy in mediastinum and abdomen. We previously  discussed the possible local therapy options, such as liver metastectomy or ablation if he has great response to chemo and his node metastasis resolves  -restaging CT from 09/04/2016 reviewed with pt, stable disease. Will continue chemo -he developed side effects from FOLFOX, and a worsening thrombocytopenia, despite reduced dose of oxaliplatin  -Due to his overall limited response to FOLFOX, and his wish to pursue surgery, I previously recommend him to change chemo to FOLFIRINOX starting with cycle 7 with low dose oxaliplatin, hopefully he will response better. -Due to his severe nausea and vomiting, anorexia, mild diarrhea, reduced his irinotecan dose from cycle 10  - PET scan from 12/19/2016 reviewed in person. He had near complete metabolic response from chemotherapy, post his liver metastasis and hypermetabolic lymph nodes are negative on PET scan with decreased size.  -Due to his worsening neuropathy, and good response to chemotherapy, I previously stopped oxaliplatin from cycle 11, and continue FOLFIRI and avastin   -Reviewed his restaging abdominal MRI findings, which showed no visible lesions in the liver. He has had a complete radiographic response -He was seen by interventional radiologist Dr. Earleen Newport, who does not think he needed a liver ablation since the solid liver met has resolved after chemo. I agree with his plan.  -He was recently seen by surgeon Dr. Barry Dienes, who recommend maintenance chemotherapy for now, no indication for resection at this point given -Given his excellent response to chemotherapy, and the poor tolerance to intravenous chemotherapy, I recommended him to change treatments to maintenance chemotherapy therapy -He started Xeloda and Avastin maintenance therapy 02/01/17, due to very poor tolerance he stopped Xeloda 02/14/17 -he has been on  5-Fu pump and avastin starting 02/22/17.  --We discussed his PET from 07/09/17 which shows a stable and a new small nodule in the lungs,  indeterminate cause at this time. I encouraged him to continue with his current maintenance chemo regimen. Per Pt request I will give him a month of chemo break after 07/12/17 cycle of chemo.  -His restaging CT on January 08, 2018 unfortunately showed enlarged liver metastasis from 1.0 cm to 1.6 cm, and several subcentimeter pulmonary nodules scattered in both lungs, new or increased from prior scan, most compatible with worsening pulmonary metastasis. No other new lesions.  Clinically doing well. -I will stop irinotecan due to the disease progression and switched to FOLFOX.  He previously had FOLFOX, which was stopped due to neuropathy and overall good response.  His neuropathy is minimal at this point, we again reviewed the potential side effect from oxaliplatin, he voiced good understanding and agrees to proceed. -We discussed the overall prognosis at this point is poor due to the disease progression, and he has had multiple line treatment.  He does have a few lines of other FDA approved treatment  options, such as loss of and regorafenib, however overall response rates are low. -I also discussed different clinical trials at different locations.  He is young, asymptomatic, with good performance status and organ functions, will be a good candidate for clinical trial. He is interested. I will contact the institutions and inform the patient with the suitable place.  -Lab reviewed, Hg 13.1, WBC 4K, Platelet 159K, will switch chemo to FOLFOX.  Due to the potential allergy reaction, I will add Solu-Medrol as premedication, and extend the oxaliplatin infusion to 3 hours. -Follow-up in 2 weeks before cycle chemo.   2. Depression, Anxiety  -Pt has long-standing history of depression, has been on Prozac for decades, it was very well controlled in the past.   -He has become more depressed lately, due to the stress from cancer diagnosis, especially stage IV, with prolonged chemotherapy, stress from work and family  responsibility, etc.  He has developed multiple symptoms, including depressed mood, insomnia, personality change, etc.  He also has suicidal ideas. -He has good family and social support.   -We had a long discussion about depression management.  -I previously discussed the role of antidepressants, he has been on Prozac 40 mg for several years. I suggest changing to Effexor. He has weaned off Prozac in 2 weeks. Started Effexor '75mg'$  on week two of taper, will titrate dose if needed. -I also suggested he start attending GI Cancer support group, I had GI Navigator Dawn speak with him more about this and other resources. Dawn recommend him to see psychologist Earl Lites, he has met her twice, but does not feel it is very helpful. -His mood has improved, but he still has episodes of crying, and angry.  He is on Effexor 150 mg daily, will continue. -He broke into tears when we discussed his CT scan findings and disease progression.  He has good social support from his family.  Emotional support provided.  I called the social worker Ander Purpura to meet him today -He copes by drinking more alcohol.  I discouraged him to drink alcohol -He will see his psychiatrist soon.  3. Hypertension  -We previously discussed Avastin can cause hypertension -I previously started him on amlodipine, and increase dose to 10 mg daily -He does not want to follow-up with his primary care physician. -Previously, his blood pressure was noted to be elevated, especially diastolic. I previously added on lisinopril 10 mg daily for him -Continue Norvasc and Lisinopril for now while on Avastin -Will continue to monitor -BP better controlled with Lisinopril and amlodipine - I previously recommended him to monitor his blood pressure at home, and increase lisinopril from 10 mg to 20 mg -His BP today is high at 145/99. I advised him to take increase lisinopril from 10 mg to 20 mg daily.   4. Anemia of iron deficiency and chemo  -He has  mild anemia, likely related to his colon cancer bleeding. -I previously recommend him to take oral iron supplement over-the-counter, potential side effects of constipation and gastric or discomfort or discussed with him. He stopped taking this before surgery. -I previously advised him to begin taking oral iron supplements again post surgery -Hg normal and stable at 13.1 and iron study has been normal lately. Will continue to monitor.   5. Anorexia and weight loss secondary to chemo  -Secondary to underlying malignancy -I previously encouraged him to try nutritional supplement, such as boost or initial and try to eat more -He was encouraged to continue eating well and  take supplemental drinks as needed to better maintain weight.  -I previously discussed how his depression can effect his appetite and to make sure he is eating and drinking well.  -He has been seen by dietitian  -I again encouraged him to take a nutritional supplement  6. Peripheral neuropathy, G1  -Secondary to oxaliplatin, dose reduced and ultimately discontinued -Slightly worsened when he was on FOLFIRI, held Irinotecan beginning 01/04/17 -Improved some, his hand function normalized, no balance issue, discussed with patient this may take some time to resolve.  - I strongly encouraged him to take vitamin B-complex supplements -Overall slightly worse numbness on his toes, will continue to monitor.   7. Goal of care discussion  -We previously discussed the incurable nature of his cancer, and the overall poor prognosis, especially if he does not have good response to chemotherapy or progress on chemo -The patient understands the goal of care is palliative.  -He is full code now     Plan  -Scan reviewed, unfortunately he has disease progression.   -Emotional support and social support for his depression and stress -I will stop irinotecan and start FOLFOX.  -Increase lisinopril from 10 mg to 20 mg daily -Lab, follow-up and  next cycle chemo in 2 weeks  Orders Placed This Encounter  Procedures  . [facility] (IN HOUSE-[facility])    All questions were answered.   The patient knows to call the clinic with any problems, questions or concerns.  I spent 30 minutes counseling the patient face to face. The total time spent in the appointment was 40 minutes and more than 50% was on counseling.   Truitt Merle  01/10/2018

## 2018-01-10 ENCOUNTER — Encounter: Payer: Self-pay | Admitting: Hematology

## 2018-01-10 ENCOUNTER — Inpatient Hospital Stay (HOSPITAL_BASED_OUTPATIENT_CLINIC_OR_DEPARTMENT_OTHER): Payer: BLUE CROSS/BLUE SHIELD | Admitting: Hematology

## 2018-01-10 ENCOUNTER — Inpatient Hospital Stay: Payer: BLUE CROSS/BLUE SHIELD

## 2018-01-10 ENCOUNTER — Telehealth: Payer: Self-pay | Admitting: Hematology

## 2018-01-10 VITALS — BP 145/99 | HR 100 | Temp 97.4°F | Resp 18 | Ht 71.0 in | Wt 199.7 lb

## 2018-01-10 VITALS — BP 135/105 | HR 87 | Temp 98.8°F | Resp 18

## 2018-01-10 DIAGNOSIS — C787 Secondary malignant neoplasm of liver and intrahepatic bile duct: Principal | ICD-10-CM

## 2018-01-10 DIAGNOSIS — C189 Malignant neoplasm of colon, unspecified: Secondary | ICD-10-CM

## 2018-01-10 DIAGNOSIS — Z79899 Other long term (current) drug therapy: Secondary | ICD-10-CM

## 2018-01-10 DIAGNOSIS — C7800 Secondary malignant neoplasm of unspecified lung: Secondary | ICD-10-CM

## 2018-01-10 DIAGNOSIS — R630 Anorexia: Secondary | ICD-10-CM

## 2018-01-10 DIAGNOSIS — G629 Polyneuropathy, unspecified: Secondary | ICD-10-CM

## 2018-01-10 DIAGNOSIS — D500 Iron deficiency anemia secondary to blood loss (chronic): Secondary | ICD-10-CM

## 2018-01-10 DIAGNOSIS — C182 Malignant neoplasm of ascending colon: Secondary | ICD-10-CM

## 2018-01-10 DIAGNOSIS — T451X5A Adverse effect of antineoplastic and immunosuppressive drugs, initial encounter: Secondary | ICD-10-CM

## 2018-01-10 DIAGNOSIS — C771 Secondary and unspecified malignant neoplasm of intrathoracic lymph nodes: Secondary | ICD-10-CM | POA: Diagnosis not present

## 2018-01-10 DIAGNOSIS — Z95828 Presence of other vascular implants and grafts: Secondary | ICD-10-CM

## 2018-01-10 DIAGNOSIS — G620 Drug-induced polyneuropathy: Secondary | ICD-10-CM

## 2018-01-10 DIAGNOSIS — I10 Essential (primary) hypertension: Secondary | ICD-10-CM

## 2018-01-10 LAB — COMPREHENSIVE METABOLIC PANEL
ALT: 22 U/L (ref 0–44)
ANION GAP: 14 (ref 5–15)
AST: 20 U/L (ref 15–41)
Albumin: 3.7 g/dL (ref 3.5–5.0)
Alkaline Phosphatase: 65 U/L (ref 38–126)
BUN: 9 mg/dL (ref 6–20)
CO2: 22 mmol/L (ref 22–32)
Calcium: 9.5 mg/dL (ref 8.9–10.3)
Chloride: 105 mmol/L (ref 98–111)
Creatinine, Ser: 1.01 mg/dL (ref 0.61–1.24)
Glucose, Bld: 105 mg/dL — ABNORMAL HIGH (ref 70–99)
POTASSIUM: 4.3 mmol/L (ref 3.5–5.1)
SODIUM: 141 mmol/L (ref 135–145)
Total Bilirubin: 0.3 mg/dL (ref 0.3–1.2)
Total Protein: 7.1 g/dL (ref 6.5–8.1)

## 2018-01-10 LAB — CBC WITH DIFFERENTIAL/PLATELET
Basophils Absolute: 0 10*3/uL (ref 0.0–0.1)
Basophils Relative: 1 %
EOS ABS: 0.1 10*3/uL (ref 0.0–0.5)
EOS PCT: 3 %
HCT: 38.6 % (ref 38.4–49.9)
Hemoglobin: 13.1 g/dL (ref 13.0–17.1)
LYMPHS ABS: 0.8 10*3/uL — AB (ref 0.9–3.3)
Lymphocytes Relative: 20 %
MCH: 34.1 pg — AB (ref 27.2–33.4)
MCHC: 34 g/dL (ref 32.0–36.0)
MCV: 100.4 fL — AB (ref 79.3–98.0)
MONO ABS: 0.5 10*3/uL (ref 0.1–0.9)
Monocytes Relative: 12 %
Neutro Abs: 2.6 10*3/uL (ref 1.5–6.5)
Neutrophils Relative %: 64 %
PLATELETS: 159 10*3/uL (ref 140–400)
RBC: 3.85 MIL/uL — ABNORMAL LOW (ref 4.20–5.82)
RDW: 16.1 % — AB (ref 11.0–14.6)
WBC: 4 10*3/uL (ref 4.0–10.3)

## 2018-01-10 LAB — IRON AND TIBC
IRON: 89 ug/dL (ref 42–163)
SATURATION RATIOS: 26 % — AB (ref 42–163)
TIBC: 348 ug/dL (ref 202–409)
UIBC: 259 ug/dL

## 2018-01-10 LAB — TOTAL PROTEIN, URINE DIPSTICK: PROTEIN: NEGATIVE mg/dL

## 2018-01-10 LAB — FERRITIN: FERRITIN: 179 ng/mL (ref 24–336)

## 2018-01-10 LAB — CEA (IN HOUSE-CHCC): CEA (CHCC-IN HOUSE): 6.99 ng/mL — AB (ref 0.00–5.00)

## 2018-01-10 MED ORDER — LEUCOVORIN CALCIUM INJECTION 350 MG
400.0000 mg/m2 | Freq: Once | INTRAVENOUS | Status: AC
  Administered 2018-01-10: 840 mg via INTRAVENOUS
  Filled 2018-01-10: qty 42

## 2018-01-10 MED ORDER — SODIUM CHLORIDE 0.9 % IV SOLN
Freq: Once | INTRAVENOUS | Status: AC
  Administered 2018-01-10: 11:00:00 via INTRAVENOUS
  Filled 2018-01-10: qty 5

## 2018-01-10 MED ORDER — DEXTROSE 5 % IV SOLN
Freq: Once | INTRAVENOUS | Status: AC
  Administered 2018-01-10: 13:00:00 via INTRAVENOUS
  Filled 2018-01-10: qty 250

## 2018-01-10 MED ORDER — CLONIDINE HCL 0.1 MG PO TABS
ORAL_TABLET | ORAL | Status: AC
  Filled 2018-01-10: qty 1

## 2018-01-10 MED ORDER — CLONIDINE HCL 0.1 MG PO TABS
0.1000 mg | ORAL_TABLET | Freq: Once | ORAL | Status: AC
  Administered 2018-01-10: 0.1 mg via ORAL

## 2018-01-10 MED ORDER — DEXTROSE 5 % IV SOLN
INTRAVENOUS | Status: DC
  Administered 2018-01-10: 13:00:00 via INTRAVENOUS
  Filled 2018-01-10: qty 250

## 2018-01-10 MED ORDER — SODIUM CHLORIDE 0.9 % IV SOLN
5.0000 mg/kg | Freq: Once | INTRAVENOUS | Status: DC
  Filled 2018-01-10: qty 18

## 2018-01-10 MED ORDER — SODIUM CHLORIDE 0.9 % IV SOLN
Freq: Once | INTRAVENOUS | Status: AC
  Administered 2018-01-10: 10:00:00 via INTRAVENOUS
  Filled 2018-01-10: qty 250

## 2018-01-10 MED ORDER — SODIUM CHLORIDE 0.9% FLUSH
10.0000 mL | Freq: Once | INTRAVENOUS | Status: AC
  Administered 2018-01-10: 10 mL
  Filled 2018-01-10: qty 10

## 2018-01-10 MED ORDER — PALONOSETRON HCL INJECTION 0.25 MG/5ML
0.2500 mg | Freq: Once | INTRAVENOUS | Status: AC
  Administered 2018-01-10: 0.25 mg via INTRAVENOUS

## 2018-01-10 MED ORDER — ATROPINE SULFATE 1 MG/ML IJ SOLN
INTRAMUSCULAR | Status: AC
  Filled 2018-01-10: qty 1

## 2018-01-10 MED ORDER — METHYLPREDNISOLONE SODIUM SUCC 125 MG IJ SOLR
125.0000 mg | Freq: Once | INTRAMUSCULAR | Status: AC
  Administered 2018-01-10: 125 mg via INTRAVENOUS

## 2018-01-10 MED ORDER — ATROPINE SULFATE 1 MG/ML IJ SOLN: 0.5000 mg | Freq: Once | INTRAMUSCULAR | Status: DC

## 2018-01-10 MED ORDER — OXALIPLATIN CHEMO INJECTION 100 MG/20ML
60.0000 mg/m2 | Freq: Once | INTRAVENOUS | Status: AC
  Administered 2018-01-10: 125 mg via INTRAVENOUS
  Filled 2018-01-10: qty 20

## 2018-01-10 MED ORDER — SODIUM CHLORIDE 0.9 % IV SOLN
Freq: Once | INTRAVENOUS | Status: AC
  Administered 2018-01-10: 16:00:00 via INTRAVENOUS
  Filled 2018-01-10: qty 250

## 2018-01-10 MED ORDER — METHYLPREDNISOLONE SODIUM SUCC 125 MG IJ SOLR
INTRAMUSCULAR | Status: AC
  Filled 2018-01-10: qty 2

## 2018-01-10 MED ORDER — SODIUM CHLORIDE 0.9 % IV SOLN
2200.0000 mg/m2 | INTRAVENOUS | Status: DC
  Administered 2018-01-10: 4600 mg via INTRAVENOUS
  Filled 2018-01-10: qty 92

## 2018-01-10 MED ORDER — PALONOSETRON HCL INJECTION 0.25 MG/5ML
INTRAVENOUS | Status: AC
  Filled 2018-01-10: qty 5

## 2018-01-10 NOTE — Patient Instructions (Signed)
[facility] Discharge Instructions for Patients Receiving Chemotherapy  Today you received the following chemotherapy agents Adrucil, Leucovorin, Avastin, Oxaliplatin.  To help prevent nausea and vomiting after your treatment, we encourage you to take your nausea medication as directed   If you develop nausea and vomiting that is not controlled by your nausea medication, call the clinic.   BELOW ARE SYMPTOMS THAT SHOULD BE REPORTED IMMEDIATELY:  *FEVER GREATER THAN 100.5 F  *CHILLS WITH OR WITHOUT FEVER  NAUSEA AND VOMITING THAT IS NOT CONTROLLED WITH YOUR NAUSEA MEDICATION  *UNUSUAL SHORTNESS OF BREATH  *UNUSUAL BRUISING OR BLEEDING  TENDERNESS IN MOUTH AND THROAT WITH OR WITHOUT PRESENCE OF ULCERS  *URINARY PROBLEMS  *BOWEL PROBLEMS  UNUSUAL RASH Items with * indicate a potential emergency and should be followed up as soon as possible.  Feel free to call the clinic should you have any questions or concerns. The clinic phone number is [phone].  Please show the [place] at check-in to the Emergency Department and triage nurse.

## 2018-01-10 NOTE — Telephone Encounter (Signed)
No LOS 8/29

## 2018-01-10 NOTE — Progress Notes (Signed)
Spoke w/ [name], methylprednisolone 125mg  will be given as pre-med to decrease risk of infusion reaction with restarting oxaliplatin. Dexamethasone will be removed from Em[facility] bag as anti-emetic to avoid excess steroid exposure.   Run oxaliplatin over 3 hours this infusion and if no reaction, could decrease back to 2 hours for future doses.   Demetrius Charity, PharmD Oncology Pharmacist Pharmacy Phone: [phone] 01/10/2018

## 2018-01-11 ENCOUNTER — Encounter: Payer: Self-pay | Admitting: Hematology

## 2018-01-12 ENCOUNTER — Inpatient Hospital Stay: Payer: BLUE CROSS/BLUE SHIELD

## 2018-01-12 DIAGNOSIS — C189 Malignant neoplasm of colon, unspecified: Secondary | ICD-10-CM

## 2018-01-12 DIAGNOSIS — C182 Malignant neoplasm of ascending colon: Secondary | ICD-10-CM | POA: Diagnosis not present

## 2018-01-12 DIAGNOSIS — C787 Secondary malignant neoplasm of liver and intrahepatic bile duct: Principal | ICD-10-CM

## 2018-01-12 MED ORDER — SODIUM CHLORIDE 0.9% FLUSH
10.0000 mL | INTRAVENOUS | Status: DC | PRN
  Administered 2018-01-12: 10 mL
  Filled 2018-01-12: qty 10

## 2018-01-12 MED ORDER — HEPARIN SOD (PORK) LOCK FLUSH 100 UNIT/ML IV SOLN
500.0000 [IU] | Freq: Once | INTRAVENOUS | Status: AC | PRN
  Administered 2018-01-12: 500 [IU]
  Filled 2018-01-12: qty 5

## 2018-01-15 ENCOUNTER — Other Ambulatory Visit: Payer: Self-pay | Admitting: Hematology

## 2018-01-15 DIAGNOSIS — R030 Elevated blood-pressure reading, without diagnosis of hypertension: Secondary | ICD-10-CM

## 2018-01-16 ENCOUNTER — Ambulatory Visit (INDEPENDENT_AMBULATORY_CARE_PROVIDER_SITE_OTHER): Payer: BLUE CROSS/BLUE SHIELD | Admitting: Psychology

## 2018-01-16 DIAGNOSIS — F4323 Adjustment disorder with mixed anxiety and depressed mood: Secondary | ICD-10-CM | POA: Diagnosis not present

## 2018-01-24 ENCOUNTER — Inpatient Hospital Stay: Payer: BLUE CROSS/BLUE SHIELD

## 2018-01-24 ENCOUNTER — Inpatient Hospital Stay: Payer: BLUE CROSS/BLUE SHIELD | Attending: Hematology

## 2018-01-24 ENCOUNTER — Ambulatory Visit: Payer: BLUE CROSS/BLUE SHIELD | Admitting: Hematology

## 2018-01-24 ENCOUNTER — Other Ambulatory Visit: Payer: BLUE CROSS/BLUE SHIELD

## 2018-01-24 ENCOUNTER — Inpatient Hospital Stay (HOSPITAL_BASED_OUTPATIENT_CLINIC_OR_DEPARTMENT_OTHER): Payer: BLUE CROSS/BLUE SHIELD | Admitting: Nurse Practitioner

## 2018-01-24 ENCOUNTER — Encounter: Payer: Self-pay | Admitting: Nurse Practitioner

## 2018-01-24 VITALS — BP 125/92

## 2018-01-24 VITALS — BP 123/86 | HR 88 | Temp 97.6°F | Resp 18 | Ht 71.0 in | Wt 205.6 lb

## 2018-01-24 DIAGNOSIS — C189 Malignant neoplasm of colon, unspecified: Secondary | ICD-10-CM

## 2018-01-24 DIAGNOSIS — D696 Thrombocytopenia, unspecified: Secondary | ICD-10-CM

## 2018-01-24 DIAGNOSIS — C771 Secondary and unspecified malignant neoplasm of intrathoracic lymph nodes: Secondary | ICD-10-CM | POA: Insufficient documentation

## 2018-01-24 DIAGNOSIS — K56609 Unspecified intestinal obstruction, unspecified as to partial versus complete obstruction: Secondary | ICD-10-CM | POA: Insufficient documentation

## 2018-01-24 DIAGNOSIS — C7800 Secondary malignant neoplasm of unspecified lung: Secondary | ICD-10-CM | POA: Insufficient documentation

## 2018-01-24 DIAGNOSIS — I10 Essential (primary) hypertension: Secondary | ICD-10-CM | POA: Diagnosis not present

## 2018-01-24 DIAGNOSIS — C182 Malignant neoplasm of ascending colon: Secondary | ICD-10-CM | POA: Insufficient documentation

## 2018-01-24 DIAGNOSIS — G629 Polyneuropathy, unspecified: Secondary | ICD-10-CM

## 2018-01-24 DIAGNOSIS — D500 Iron deficiency anemia secondary to blood loss (chronic): Secondary | ICD-10-CM

## 2018-01-24 DIAGNOSIS — C787 Secondary malignant neoplasm of liver and intrahepatic bile duct: Principal | ICD-10-CM

## 2018-01-24 DIAGNOSIS — F419 Anxiety disorder, unspecified: Secondary | ICD-10-CM | POA: Diagnosis not present

## 2018-01-24 DIAGNOSIS — Z79899 Other long term (current) drug therapy: Secondary | ICD-10-CM | POA: Insufficient documentation

## 2018-01-24 DIAGNOSIS — Z9049 Acquired absence of other specified parts of digestive tract: Secondary | ICD-10-CM | POA: Diagnosis not present

## 2018-01-24 DIAGNOSIS — Z5111 Encounter for antineoplastic chemotherapy: Secondary | ICD-10-CM | POA: Insufficient documentation

## 2018-01-24 DIAGNOSIS — R630 Anorexia: Secondary | ICD-10-CM | POA: Diagnosis not present

## 2018-01-24 DIAGNOSIS — G620 Drug-induced polyneuropathy: Secondary | ICD-10-CM

## 2018-01-24 DIAGNOSIS — Z95828 Presence of other vascular implants and grafts: Secondary | ICD-10-CM

## 2018-01-24 DIAGNOSIS — T451X5A Adverse effect of antineoplastic and immunosuppressive drugs, initial encounter: Secondary | ICD-10-CM

## 2018-01-24 DIAGNOSIS — I2510 Atherosclerotic heart disease of native coronary artery without angina pectoris: Secondary | ICD-10-CM | POA: Diagnosis not present

## 2018-01-24 DIAGNOSIS — F329 Major depressive disorder, single episode, unspecified: Secondary | ICD-10-CM | POA: Diagnosis not present

## 2018-01-24 LAB — CBC WITH DIFFERENTIAL/PLATELET
Basophils Absolute: 0 10*3/uL (ref 0.0–0.1)
Basophils Relative: 0 %
EOS ABS: 0.1 10*3/uL (ref 0.0–0.5)
Eosinophils Relative: 1 %
HCT: 34.8 % — ABNORMAL LOW (ref 38.4–49.9)
HEMOGLOBIN: 11.6 g/dL — AB (ref 13.0–17.1)
LYMPHS ABS: 1 10*3/uL (ref 0.9–3.3)
LYMPHS PCT: 20 %
MCH: 34 pg — AB (ref 27.2–33.4)
MCHC: 33.3 g/dL (ref 32.0–36.0)
MCV: 102.1 fL — ABNORMAL HIGH (ref 79.3–98.0)
Monocytes Absolute: 0.6 10*3/uL (ref 0.1–0.9)
Monocytes Relative: 13 %
NEUTROS ABS: 3.3 10*3/uL (ref 1.5–6.5)
Neutrophils Relative %: 66 %
Platelets: 146 10*3/uL (ref 140–400)
RBC: 3.41 MIL/uL — AB (ref 4.20–5.82)
RDW: 16.1 % — ABNORMAL HIGH (ref 11.0–14.6)
WBC: 5 10*3/uL (ref 4.0–10.3)

## 2018-01-24 LAB — COMPREHENSIVE METABOLIC PANEL
ALK PHOS: 59 U/L (ref 38–126)
ALT: 17 U/L (ref 0–44)
ANION GAP: 11 (ref 5–15)
AST: 20 U/L (ref 15–41)
Albumin: 3.5 g/dL (ref 3.5–5.0)
BUN: 15 mg/dL (ref 6–20)
CALCIUM: 8.6 mg/dL — AB (ref 8.9–10.3)
CO2: 21 mmol/L — ABNORMAL LOW (ref 22–32)
Chloride: 105 mmol/L (ref 98–111)
Creatinine, Ser: 1.02 mg/dL (ref 0.61–1.24)
Glucose, Bld: 163 mg/dL — ABNORMAL HIGH (ref 70–99)
Potassium: 3.8 mmol/L (ref 3.5–5.1)
Sodium: 137 mmol/L (ref 135–145)
Total Bilirubin: 0.4 mg/dL (ref 0.3–1.2)
Total Protein: 6.4 g/dL — ABNORMAL LOW (ref 6.5–8.1)

## 2018-01-24 LAB — TOTAL PROTEIN, URINE DIPSTICK: Protein, ur: NEGATIVE mg/dL

## 2018-01-24 MED ORDER — SODIUM CHLORIDE 0.9 % IV SOLN
Freq: Once | INTRAVENOUS | Status: AC
  Administered 2018-01-24: 13:00:00 via INTRAVENOUS
  Filled 2018-01-24: qty 250

## 2018-01-24 MED ORDER — METHYLPREDNISOLONE SODIUM SUCC 125 MG IJ SOLR
125.0000 mg | Freq: Once | INTRAMUSCULAR | Status: AC
  Administered 2018-01-24: 125 mg via INTRAVENOUS

## 2018-01-24 MED ORDER — PALONOSETRON HCL INJECTION 0.25 MG/5ML
0.2500 mg | Freq: Once | INTRAVENOUS | Status: AC
  Administered 2018-01-24: 0.25 mg via INTRAVENOUS

## 2018-01-24 MED ORDER — METHYLPREDNISOLONE SODIUM SUCC 125 MG IJ SOLR
INTRAMUSCULAR | Status: AC
  Filled 2018-01-24: qty 2

## 2018-01-24 MED ORDER — SODIUM CHLORIDE 0.9 % IV SOLN
2200.0000 mg/m2 | INTRAVENOUS | Status: DC
  Administered 2018-01-24: 4600 mg via INTRAVENOUS
  Filled 2018-01-24: qty 92

## 2018-01-24 MED ORDER — SODIUM CHLORIDE 0.9% FLUSH
10.0000 mL | INTRAVENOUS | Status: DC | PRN
  Filled 2018-01-24: qty 10

## 2018-01-24 MED ORDER — SODIUM CHLORIDE 0.9 % IV SOLN
5.0000 mg/kg | Freq: Once | INTRAVENOUS | Status: AC
  Administered 2018-01-24: 450 mg via INTRAVENOUS
  Filled 2018-01-24: qty 16

## 2018-01-24 MED ORDER — HEPARIN SOD (PORK) LOCK FLUSH 100 UNIT/ML IV SOLN
500.0000 [IU] | Freq: Once | INTRAVENOUS | Status: DC | PRN
  Filled 2018-01-24: qty 5

## 2018-01-24 MED ORDER — DEXAMETHASONE 4 MG PO TABS: ORAL_TABLET | ORAL | 0 refills | Status: DC

## 2018-01-24 MED ORDER — DEXTROSE 5 % IV SOLN
Freq: Once | INTRAVENOUS | Status: AC
  Administered 2018-01-24: 14:00:00 via INTRAVENOUS
  Filled 2018-01-24: qty 250

## 2018-01-24 MED ORDER — SODIUM CHLORIDE 0.9 % IV SOLN
Freq: Once | INTRAVENOUS | Status: AC
  Administered 2018-01-24: 13:00:00 via INTRAVENOUS
  Filled 2018-01-24: qty 5

## 2018-01-24 MED ORDER — OXALIPLATIN CHEMO INJECTION 100 MG/20ML
60.0000 mg/m2 | Freq: Once | INTRAVENOUS | Status: AC
  Administered 2018-01-24: 125 mg via INTRAVENOUS
  Filled 2018-01-24: qty 25

## 2018-01-24 MED ORDER — PALONOSETRON HCL INJECTION 0.25 MG/5ML
INTRAVENOUS | Status: AC
  Filled 2018-01-24: qty 5

## 2018-01-24 MED ORDER — LEUCOVORIN CALCIUM INJECTION 350 MG
400.0000 mg/m2 | Freq: Once | INTRAVENOUS | Status: AC
  Administered 2018-01-24: 840 mg via INTRAVENOUS
  Filled 2018-01-24: qty 42

## 2018-01-24 MED ORDER — SODIUM CHLORIDE 0.9% FLUSH
10.0000 mL | Freq: Once | INTRAVENOUS | Status: AC
  Administered 2018-01-24: 10 mL
  Filled 2018-01-24: qty 10

## 2018-01-24 NOTE — Progress Notes (Signed)
[facility]  [phone]) [phone] [phone]) [phone]  Clinic Follow up Note   Patient Care Team: Curlene Labrum, MD as PCP - General (Family Medicine) 01/24/2018  SUMMARY OF ONCOLOGIC HISTORY: Oncology History   Cancer Staging Metastatic colon cancer to liver [facility]) Staging form: Colon and Rectum, AJCC 8th Edition - Clinical stage from 06/01/2016: Stage IVA (cTX, cNX, pM1a) - Signed by Truitt Merle, MD on 07/04/2016 - Pathologic stage from 06/14/2016: Stage IVA (pT4b(m), pN2b, pM1a) - Signed by Truitt Merle, MD on 07/04/2016       Metastatic colon cancer to liver ([place])   04/2015 Procedure    Colonoscopy by Dr. Ladona Horns. It showed showed 2 sessile polyps ready between 3-5 mm in size located 20 cm (A, B) from the point of entry, polypectomy was performed. Pedunculated polyp was found in the ascending colon (C), polypectomy was performed, and additional polyp (D) was found 30 cm from the point of entry, removed    04/2015 Pathology Results    tubular adenoma (A and B), and well differentiated adenocarcinoma arising from tubulovillous adenoma (C) and well differentiated adenocarcinoma arising from severe dysplasia to intramucosal carcinoma within tubular adenoma.     04/2015 Initial Diagnosis    Metastatic colon cancer to liver ([place])    05/29/2016 Imaging    CT abdomen and pelvis with contrast showed an apple core like stricture in right colon just above the cecum, measuring 3.2 cm in lengths. This is highly suspicious for malignancy. Small lymph node a noticed he had adjacent mesentery, measuring 8 mm. There is a low-density lesion within the inferior aspect of the right hepatic lobe measuring 1.7 cm, suspicious for metastasis.     06/06/2016 Tumor Marker    CEA 9.99    06/08/2016 PET scan    IMPRESSION: Approximately 3 cm hypermetabolic mass in the ascending colon, consistent with primary colon carcinoma. This mass results in colonic obstruction and small bowel dilatation.  Additional areas of hypermetabolic wall thickening in the cecum may represent other sites of colon carcinoma or colitis. Mild hypermetabolic lymphadenopathy in right pericolonic region, porta hepatis, and aortocaval space, consistent with metastatic disease. Mild hypermetabolic mediastinal lymphadenopathy also seen, and thoracic lymph node metastases cannot be excluded. Solitary hypermetabolic focus in inferior right hepatic lobe, consistent with liver metastasis. Consider abdomen MRI without and with contrast for further evaluation.    06/14/2016 Surgery    Hand assisted right hemicolectomy and small bowel resection for colon cancer, liver biopsy, by Dr. Barry Dienes    06/14/2016 Pathology Results    Right hemicolectomy showed invasive well to moderately differentiated adenocarcinoma, 2 foci measuring 7.5 cm and 4.5 cm, tumor invades through full thickness of colon, to the seroma and involve the Small Bowel, Surgical Margins Were Negative, 24 Out Of 64 Lymph Nodes Were Positive, Extracapsular Extension Identified, Multiple Satellite Tumor Deposits Present, Liver Biopsy Showed Metastatic Adenocarcinoma.      06/14/2016 Miscellaneous    Tumor MMR normal, MSI stable     06/14/2016 Miscellaneous    Foundation one genomic testing showed K-ras G12 D mutation, APC and TP53 mutation. No BRAF and NRAS mutation. MSI-stable, tumor burden low.    07/06/2016 Tumor Marker    CEA 13.69    07/13/2016 - 01/18/2017 Chemotherapy    mFOLFOX, every 2 weeks, started on 07/14/2015, Avastin added from cycle 3  Oxaliplatin dose to 18m/m2 due to side effects and some cytopenia on 09/21/16  Changed to FOLFIRINOX starting cycle 7 and Reduced  Due to  neuropathy hold Oxaliplatin and add Irinotecan with neulasta on day 3 starting with cycle 7 Add low dose Oxaliplatin with cycle 8.  Due to his worsening neuropathy, and good response to chemotherapy, I previously stopped oxaliplatin from cycle 11, and continue FOLFIRI and avastin        07/27/2016 Tumor Marker    CEA 15.85    09/04/2016 Imaging    Ct C/A/P W Contrast IMPRESSION: Interval right colectomy. Stable small liver metastasis in the inferior right hepatic lobe. Stable mild porta hepatis and aortocaval lymphadenopathy. Stable mild mediastinal lymphadenopathy. No new or progressive metastatic disease identified within the chest, abdomen, or pelvis.    12/19/2016 PET scan    IMPRESSION: 1. Right hemicolectomy, with resolution of the prior hypermetabolic activity inferiorly in the right hepatic lobe, in several mediastinal lymph nodes, and in lymph nodes in the retroperitoneum and porta hepatis. No residual hypermetabolic or enlarged lymph nodes are identified. 2. Low-grade diffuse skeletal metabolic activity is likely therapy related. 3. Coronary atherosclerosis. 4. 3 by 4 mm right middle lobe pulmonary nodule is stable, not appreciably hypermetabolic, but below sensitive PET-CT size thresholds. This may warrant surveillance.    01/11/2017 Imaging    MR Abdomen W WO Contrast IMPRESSION: 1. No acute findings within the abdomen. Previously noted liver metastasis has resolved in the interval. No new lesions.    02/01/2017 -  Chemotherapy    Maintenance therapy, Xeloda 2034m (1000 mg/m2)  q12h on day 1-14 every 21 days plus AVASTIN, starting 02/01/2017.  stopped after 12 days due to poor tolerance on 02/14/17  Changed to maintenance 5-FU and avastin every 2 weeks starting on 02/22/17      04/10/2017 Imaging    IMPRESSION: 1. Status post right hemicolectomy without findings for recurrent tumor. 2. No worrisome hepatic lesions. Treated disease with only a small residual low attenuation lesion in the right hepatic lobe. 3. No recurrent mediastinal or abdominal lymphadenopathy.     07/09/2017 PET scan    PET 07/09/17 IMPRESSION: 1. Status post right hemicolectomy, without findings of hypermetabolic recurrent or metastatic disease. 2. A right middle lobe  pulmonary nodule is unchanged. However, there is a right lower lobe 5 mm pulmonary nodule which is felt to be new and enlarged compared to prior exams. Suspicious for an isolated pulmonary metastasis. Consider CT follow-up at 3-6 months. 3. Age advanced coronary artery atherosclerosis. Recommend assessment of coronary risk factors and consideration of medical therapy. 4. Borderline ascending aortic dilatation, 4.0 cm      10/10/2017 Imaging    IMPRESSION: 1. Several (at least 10) subcentimeter pulmonary nodules scattered in both lungs, predominantly in the lower lobes, all new/increased, most compatible with enlarging pulmonary metastases, largest 8 mm in the right lower lobe. 2. No additional findings of new or progressive metastatic disease. No recurrent adenopathy. Stable small low-attenuation lesion in the inferior right liver lobe compatible with treated metastasis. No new liver metastases. 3. Stable ectatic 4.0 cm ascending thoracic aorta. Recommend annual imaging followup by CTA or MRA.  4. Stable mild splenomegaly.     01/08/2018 Imaging    01/08/2018 CT CAP IMPRESSION: 1. Progressive hepatic and pulmonary metastatic disease. 2. Ascending Aortic aneurysm NOS (ICD10-I71.9).    01/08/2018 Progression    01/08/2018 CT CAP IMPRESSION: 1. Progressive hepatic and pulmonary metastatic disease. 2. Ascending Aortic aneurysm NOS (ICD10-I71.9).   CURRENT THERAPY:  FOLFIRI and Avastin every 2 weeks, change FOLFOX and Avastin on 01/10/2018 due to disease progression   INTERVAL HISTORY: Mr. CBasquez  returns for follow up and next cycle FOLFOX as scheduled. She completed first cycle on 8/29. He notes it took 8 days for appetite, general mood, and cold sensitivity to recover. He feels better with short course of steroids after chemo. He continues to work. He has a general feeling of "GI upset" on this regimen. He ate steak and potatoes last night, coupled with alcohol and viagra, and vomited  once afterwards. He denies n/c/d. He feels depressed and angry, has started seeing Dr. Cheryln Manly for mental health. He is drinking 4-6 alcoholic drinks per day. He denies SI. He ran out of effexor for a couple days and could sense negative effects but stable once he resumed. Neuropathy is more noticeable, especially in first week after oxaliplatin, without functional limitations. He denies mucositis, fever, chills, cough, chest pain, or dyspnea.    MEDICAL HISTORY:  Past Medical History:  Diagnosis Date  . Anxiety   . Cancer of ascending colon ([place])   . Seasonal allergies     SURGICAL HISTORY: Past Surgical History:  Procedure Laterality Date  . APPENDECTOMY  1992  . CATARACT EXTRACTION W/ INTRAOCULAR LENS IMPLANT Left 04/2016  . COLON SURGERY    . COLONOSCOPY W/ BIOPSIES AND POLYPECTOMY  04/2015  . INGUINAL HERNIA REPAIR Right 1982  . IR RADIOLOGIST EVAL & MGMT  01/16/2017  . KNEE ARTHROSCOPY W/ MENISCECTOMY Right 1998  . LAPAROSCOPIC RIGHT COLECTOMY N/A 06/14/2016   Procedure: LAPAROSCOPIC HAND ASSISTED HEMICOLECTOMY AND SMALL BOWEL RESECTION.;  Surgeon: [name], MD;  Location: [facility];  Service: General;  Laterality: N/A;  . LIVER BIOPSY Right 06/14/2016   Procedure: LIVER BIOPSY;  Surgeon: [name], MD;  Location: [facility];  Service: General;  Laterality: Right;  Right Inferior Liver  . PORTACATH PLACEMENT N/A 07/06/2016   Procedure: INSERTION PORT-A-CATH;  Surgeon: [name], MD;  Location: [facility];  Service: General;  Laterality: N/A;  . VASECTOMY  2005    I have reviewed the social history and family history with the patient and they are unchanged from previous note.  ALLERGIES:  is allergic to penicillins and xeloda [capecitabine].  MEDICATIONS:  Current Outpatient Medications  Medication Sig Dispense Refill  . amLODipine (NORVASC) 10 MG tablet Take 1 tablet (10 mg total) by mouth daily. 30 tablet 2  . celecoxib (CELEBREX) 200 MG capsule Take 1  capsule (200 mg total) by mouth daily. 60 capsule 1  . dexamethasone (DECADRON) 4 MG tablet UP TO THREE DAYS AFTER CHEMO (BEGIN DAY TWO) 15 tablet 0  . diphenoxylate-atropine (LOMOTIL) 2.5-0.025 MG tablet Take 2 tablets by mouth 4 (four) times daily as needed for diarrhea or loose stools. 30 tablet 0  . lidocaine-prilocaine (EMLA) cream Apply to affected area once 30 g 3  . lisinopril (PRINIVIL,ZESTRIL) 10 MG tablet Take 1 tablet (10 mg total) by mouth daily. 90 tablet 2  . LORazepam (ATIVAN) 0.5 MG tablet Take 1 tablet (0.5 mg total) by mouth every 8 (eight) hours as needed (for nausea). 30 tablet 0  . Multiple Vitamin (MULTIVITAMIN WITH MINERALS) TABS tablet Take 1 tablet by mouth daily.    . ondansetron (ZOFRAN ODT) 8 MG disintegrating tablet Take 1 tablet (8 mg total) by mouth every 8 (eight) hours as needed for nausea or vomiting. 30 tablet 2  . prochlorperazine (COMPAZINE) 10 MG tablet Take 1 tablet (10 mg total) by mouth every 6 (six) hours as needed for nausea or vomiting. 30 tablet 3  . traMADol (ULTRAM) 50 MG tablet  Take 1 tablet (50 mg total) by mouth every 6 (six) hours as needed. 30 tablet 0  . venlafaxine XR (EFFEXOR XR) 150 MG 24 hr capsule Take 1 capsule (150 mg total) by mouth daily with breakfast. 90 capsule 1  . urea (CARMOL) 10 % cream Apply topically 3 (three) times daily. 71 g 1   No current facility-administered medications for this visit.    Facility-Administered Medications Ordered in Other Visits  Medication Dose Route Frequency Provider Last Rate Last Dose  . clindamycin (CLEOCIN) 900 mg in dextrose 5 % 50 mL IVPB  900 mg Intravenous 60 min Pre-Op [name], MD       And  . gentamicin (GARAMYCIN) 450 mg in dextrose 5 % 50 mL IVPB  5 mg/kg Intravenous 60 min Pre-Op [name], MD      . dexamethasone (DECADRON) injection 10 mg  10 mg Intravenous Once Harle Stanford., PA-C      . diphenoxylate-atropine (LOMOTIL) 2.5-0.025 MG per tablet 2 tablet  2 tablet Oral Once  Harle Stanford., PA-C      . fluorouracil (ADRUCIL) 4,600 mg in sodium chloride 0.9 % 58 mL chemo infusion  2,200 mg/m2 (Treatment Plan Recorded) Intravenous 1 day or 1 dose Truitt Merle, MD      . heparin lock flush 100 unit/mL  500 Units Intracatheter Once PRN Truitt Merle, MD      . heparin lock flush 100 unit/mL  500 Units Intracatheter Once PRN Truitt Merle, MD      . sodium chloride flush (NS) 0.9 % injection 10 mL  10 mL Intracatheter PRN Truitt Merle, MD      . sodium chloride flush (NS) 0.9 % injection 10 mL  10 mL Intracatheter Once Truitt Merle, MD      . sodium chloride flush (NS) 0.9 % injection 10 mL  10 mL Intracatheter PRN Truitt Merle, MD        PHYSICAL EXAMINATION: ECOG PERFORMANCE STATUS: 1 - Symptomatic but completely ambulatory  Vitals:   01/24/18 1156  BP: 123/86  Pulse: 88  Resp: 18  Temp: 97.6 F (36.4 C)  SpO2: 99%   Filed Weights   01/24/18 1156  Weight: 205 lb 9.6 oz (93.3 kg)    GENERAL:alert, no distress and comfortable SKIN: no rashes or significant lesions EYES: sclera clear OROPHARYNX:no thrush or ulcers  LYMPH:  no palpable cervical or supraclavicular lymphadenopathy  LUNGS: clear to auscultation with normal breathing effort HEART: regular rate & rhythm, no lower extremity edema ABDOMEN:abdomen soft, non-tender and normal bowel sounds Musculoskeletal:no cyanosis of digits and no clubbing  NEURO: alert & oriented x 3 with fluent speech, no focal motor deficits PAC without erythema   LABORATORY DATA:  I have reviewed the data as listed CBC Latest Ref Rng & Units 01/24/2018 01/10/2018 12/27/2017  WBC 4.0 - 10.3 K/uL 5.0 4.0 5.1  Hemoglobin 13.0 - 17.1 g/dL 11.6(L) 13.1 12.8(L)  Hematocrit 38.4 - 49.9 % 34.8(L) 38.6 36.8(L)  Platelets 140 - 400 K/uL 146 159 167     CMP Latest Ref Rng & Units 01/24/2018 01/10/2018 12/27/2017  Glucose 70 - 99 mg/dL 163(H) 105(H) 84  BUN 6 - 20 mg/dL _0 Creatinine 0.61 - 1.24 mg/dL 1.02 1.01 0.84  Sodium 135 - 145 mmol/L  137 141 138  Potassium 3.5 - 5.1 mmol/L 3.8 4.3 4.3  Chloride 98 - 111 mmol/L 105 105 105  CO2 22 - 32 mmol/L 21(L) 22 24  Calcium 8.9 -  10.3 mg/dL 8.6(L) 9.5 9.1  Total Protein 6.5 - 8.1 g/dL 6.4(L) 7.1 7.0  Total Bilirubin 0.3 - 1.2 mg/dL 0.4 0.3 0.3  Alkaline Phos 38 - 126 U/L 59 65 57  AST 15 - 41 U/L _0 ALT 0 - 44 U/L _1 TUMOR MARKER Results for EVREN, SHANKLAND (MRN [id number]) as of 01/08/2018 11:33  Ref. Range 09/13/2017 11:08 10/11/2017 10:40 10/25/2017 10:41 11/14/2017 09:30 12/13/2017 11:48  CEA ([facility]) Latest Ref Range: 0.00 - 5.00 ng/mL 4.59 4.95 5.26 (H) 4.82 6.93 (H)   CEA  01/10/18: 6.99   PATHOLOGY   Diagnosis 06/14/2016 1. Colon, segmental resection for tumor, Right Ascending Hemicolectomy and Small Bowel - INVASIVE WELL TO MODERATELY DIFFERENTIATED ADENOCARCINOMA. - TWO TUMOR FOCI MEASURING 7.5 CM AND 4.5 CM IN GREATEST DIMENSION. - TUMOR INVADES THROUGH FULL THICKNESS OF COLON, THROUGH THE SEROSA TO INVOLVE THE SMALL BOWEL. - MARGINS ARE NEGATIVE. - TWENTY FOUR OF SIXTY FOUR LYMPH NODES POSITIVE FOR METASTATIC ADENOCARCINOMA (24/64). - EXTRACAPSULAR EXTENSION IDENTIFIED - MULTIPLE SATELLITE TUMOR DEPOSITS PRESENT. - SEE ONCOLOGY TEMPLATE. 2. Liver, needle/core biopsy, Right Inferior - POSITIVE FOR METASTATIC ADENOCARCINOMA.       RADIOGRAPHIC STUDIES: I have personally reviewed the radiological images as listed and agreed with the findings in the report. No results found.   ASSESSMENT & PLAN: Izmael Duross is a 57 y.o. Caucasian male, without significant past medical history, presented with rectal bleeding in December 2016, colonoscopy showed 4 polyps, 2 of them showed invasive adenocarcinoma, unfortunately he was not aware of the pathology findings and was not treated. He now presented with fatigue, weight loss, anorexia and abdominal pain  1. Right colon cancer with liver, node and probable lung metastasis, [id number],  stage IV, MSI-stable, KRAS G12D mutation (+) 2. Depression, anxiety - now seeing Dr. Cheryln Manly, on effexor xr 150 mg  3. HTN 4. Anemia of iron deficiency and chemo  5. Peripheral neuropathy, G1 6. Goal of care discussion   Mr. Puglia appears stable. He completed first cycle FOLFOX since resuming this regimen on 8/29. He tolerated moderately well with mild fatigue, GI upset and neuropathy, recovering after 8 days. CBC and CMP reviewed, adequate to continue treatment. He will proceed with 2nd cycle FOLFOX/avastin today at current doses. He benefits from short course of steroids after chemo, to help his fatigue and overall condition, I refilled decadron. BP is much improved on lisinopril and amlodipine.   He is currently under the care of Dr. Cheryln Manly, which I support for ongoing mental health care. He ran out of effexor for 2 days, and felt the negative effects. I urged him to not miss a dose or discontinue abruptly, he agrees. I encouraged him to avoid alcohol during chemo, and limit in general as a coping strategy for his depression. He is working through Radiographer, therapeutic with Dr. Cheryln Manly.   He will proceed with treatment today. He is scheduled to return in 2 weeks for f/u and cycle 3. That date falls on his wife's birthday, and he may cancel treatment. I offered to push to the next week, he will think about it.   PLAN: -Labs reviewed, proceed with 2nd cycle FOLFOX/avastin, continue q2 weeks -Continue MH care with Dr. Cheryln Manly -Refilled decadron, to take 1 tab daily for 3 days after chemo, begin on day 2 -return in 2 weeks for f/u and cycle 3  All questions were answered. The patient knows to call the clinic with any problems, questions or  concerns. No barriers to learning was detected. I spent 20 minutes counseling the patient face to face. The total time spent in the appointment was 25 minutes and more than 50% was on counseling and review of test results     Alla Feeling, NP 01/24/18

## 2018-01-24 NOTE — Patient Instructions (Signed)
[facility] Discharge Instructions for Patients Receiving Chemotherapy  Today you received the following chemotherapy agents Adrucil, Leucovorin, Avastin, Oxaliplatin.  To help prevent nausea and vomiting after your treatment, we encourage you to take your nausea medication as directed   If you develop nausea and vomiting that is not controlled by your nausea medication, call the clinic.   BELOW ARE SYMPTOMS THAT SHOULD BE REPORTED IMMEDIATELY:  *FEVER GREATER THAN 100.5 F  *CHILLS WITH OR WITHOUT FEVER  NAUSEA AND VOMITING THAT IS NOT CONTROLLED WITH YOUR NAUSEA MEDICATION  *UNUSUAL SHORTNESS OF BREATH  *UNUSUAL BRUISING OR BLEEDING  TENDERNESS IN MOUTH AND THROAT WITH OR WITHOUT PRESENCE OF ULCERS  *URINARY PROBLEMS  *BOWEL PROBLEMS  UNUSUAL RASH Items with * indicate a potential emergency and should be followed up as soon as possible.  Feel free to call the clinic should you have any questions or concerns. The clinic phone number is [phone].  Please show the [facility] at check-in to the Emergency Department and triage nurse.

## 2018-01-24 NOTE — Progress Notes (Signed)
Per Dr Burr Medico ok to take 5FU pump off Saturday 01/26/2018 at 1300  to infuse over 44 hours. Pump rate re-adjusted to a rate of 3.4 mL/hr.

## 2018-01-24 NOTE — Progress Notes (Signed)
Spoke w/ [name] - no issues reported with oxaliplatin infusion on 8/29 (run over 3 hours), so will run over 2 hours from now on as long as pt tolerates it.   Methylprednisolone will continue to be used in place of dexamethasone for pre-meds to decrease risk of infusion reactions.  Demetrius Charity, PharmD Oncology Pharmacist Pharmacy Phone: [phone] 01/24/2018

## 2018-01-26 ENCOUNTER — Inpatient Hospital Stay: Payer: BLUE CROSS/BLUE SHIELD

## 2018-01-26 VITALS — BP 137/93 | HR 64 | Temp 97.6°F | Resp 17

## 2018-01-26 DIAGNOSIS — C189 Malignant neoplasm of colon, unspecified: Secondary | ICD-10-CM

## 2018-01-26 DIAGNOSIS — C787 Secondary malignant neoplasm of liver and intrahepatic bile duct: Principal | ICD-10-CM

## 2018-01-26 DIAGNOSIS — C182 Malignant neoplasm of ascending colon: Secondary | ICD-10-CM | POA: Diagnosis not present

## 2018-01-26 MED ORDER — HEPARIN SOD (PORK) LOCK FLUSH 100 UNIT/ML IV SOLN
500.0000 [IU] | Freq: Once | INTRAVENOUS | Status: AC | PRN
  Administered 2018-01-26: 500 [IU]
  Filled 2018-01-26: qty 5

## 2018-01-26 MED ORDER — SODIUM CHLORIDE 0.9% FLUSH
10.0000 mL | INTRAVENOUS | Status: DC | PRN
  Administered 2018-01-26: 10 mL
  Filled 2018-01-26: qty 10

## 2018-01-27 ENCOUNTER — Encounter: Payer: Self-pay | Admitting: Hematology

## 2018-01-29 ENCOUNTER — Telehealth: Payer: Self-pay | Admitting: Hematology

## 2018-01-29 NOTE — Telephone Encounter (Signed)
Faxed last office note to [facility] [phone]

## 2018-01-31 ENCOUNTER — Other Ambulatory Visit: Payer: Self-pay

## 2018-01-31 DIAGNOSIS — C189 Malignant neoplasm of colon, unspecified: Secondary | ICD-10-CM

## 2018-01-31 DIAGNOSIS — C787 Secondary malignant neoplasm of liver and intrahepatic bile duct: Principal | ICD-10-CM

## 2018-01-31 MED ORDER — LISINOPRIL 10 MG PO TABS: 20.0000 mg | ORAL_TABLET | Freq: Every day | ORAL | 2 refills | Status: DC

## 2018-02-07 ENCOUNTER — Encounter: Payer: Self-pay | Admitting: Nurse Practitioner

## 2018-02-07 ENCOUNTER — Inpatient Hospital Stay: Payer: BLUE CROSS/BLUE SHIELD

## 2018-02-07 ENCOUNTER — Inpatient Hospital Stay (HOSPITAL_BASED_OUTPATIENT_CLINIC_OR_DEPARTMENT_OTHER): Payer: BLUE CROSS/BLUE SHIELD | Admitting: Nurse Practitioner

## 2018-02-07 VITALS — BP 137/96 | HR 77 | Temp 98.3°F | Resp 20

## 2018-02-07 VITALS — BP 121/94 | HR 96 | Temp 97.7°F | Resp 18 | Ht 71.0 in | Wt 207.8 lb

## 2018-02-07 DIAGNOSIS — C771 Secondary and unspecified malignant neoplasm of intrathoracic lymph nodes: Secondary | ICD-10-CM | POA: Diagnosis not present

## 2018-02-07 DIAGNOSIS — R630 Anorexia: Secondary | ICD-10-CM

## 2018-02-07 DIAGNOSIS — C189 Malignant neoplasm of colon, unspecified: Secondary | ICD-10-CM

## 2018-02-07 DIAGNOSIS — C787 Secondary malignant neoplasm of liver and intrahepatic bile duct: Secondary | ICD-10-CM | POA: Diagnosis not present

## 2018-02-07 DIAGNOSIS — F419 Anxiety disorder, unspecified: Secondary | ICD-10-CM

## 2018-02-07 DIAGNOSIS — C182 Malignant neoplasm of ascending colon: Secondary | ICD-10-CM

## 2018-02-07 DIAGNOSIS — D500 Iron deficiency anemia secondary to blood loss (chronic): Secondary | ICD-10-CM

## 2018-02-07 DIAGNOSIS — D696 Thrombocytopenia, unspecified: Secondary | ICD-10-CM

## 2018-02-07 DIAGNOSIS — C7800 Secondary malignant neoplasm of unspecified lung: Secondary | ICD-10-CM | POA: Diagnosis not present

## 2018-02-07 DIAGNOSIS — I10 Essential (primary) hypertension: Secondary | ICD-10-CM

## 2018-02-07 DIAGNOSIS — Z79899 Other long term (current) drug therapy: Secondary | ICD-10-CM

## 2018-02-07 DIAGNOSIS — Z95828 Presence of other vascular implants and grafts: Secondary | ICD-10-CM

## 2018-02-07 DIAGNOSIS — G629 Polyneuropathy, unspecified: Secondary | ICD-10-CM

## 2018-02-07 LAB — COMPREHENSIVE METABOLIC PANEL
ALT: 24 U/L (ref 0–44)
AST: 20 U/L (ref 15–41)
Albumin: 3.7 g/dL (ref 3.5–5.0)
Alkaline Phosphatase: 55 U/L (ref 38–126)
Anion gap: 13 (ref 5–15)
BILIRUBIN TOTAL: 0.3 mg/dL (ref 0.3–1.2)
BUN: 13 mg/dL (ref 6–20)
CO2: 21 mmol/L — ABNORMAL LOW (ref 22–32)
CREATININE: 1.02 mg/dL (ref 0.61–1.24)
Calcium: 9.3 mg/dL (ref 8.9–10.3)
Chloride: 107 mmol/L (ref 98–111)
GFR calc Af Amer: >60 mL/min (ref >60)
Glucose, Bld: 105 mg/dL — ABNORMAL HIGH (ref 70–99)
POTASSIUM: 4.4 mmol/L (ref 3.5–5.1)
Sodium: 141 mmol/L (ref 135–145)
TOTAL PROTEIN: 7.5 g/dL (ref 6.5–8.1)

## 2018-02-07 LAB — CBC WITH DIFFERENTIAL/PLATELET
BASOS ABS: 0 10*3/uL (ref 0.0–0.1)
Basophils Relative: 1 %
EOS ABS: 0.1 10*3/uL (ref 0.0–0.5)
Eosinophils Relative: 2 %
HCT: 37 % — ABNORMAL LOW (ref 38.4–49.9)
Hemoglobin: 13.1 g/dL (ref 13.0–17.1)
LYMPHS PCT: 19 %
Lymphs Abs: 0.8 10*3/uL — ABNORMAL LOW (ref 0.9–3.3)
MCH: 35.3 pg — ABNORMAL HIGH (ref 27.2–33.4)
MCHC: 35.3 g/dL (ref 32.0–36.0)
MCV: 99.9 fL — ABNORMAL HIGH (ref 79.3–98.0)
Monocytes Absolute: 0.5 10*3/uL (ref 0.1–0.9)
Monocytes Relative: 12 %
Neutro Abs: 2.7 10*3/uL (ref 1.5–6.5)
Neutrophils Relative %: 66 %
PLATELETS: 152 10*3/uL (ref 140–400)
RBC: 3.71 MIL/uL — AB (ref 4.20–5.82)
RDW: 15.8 % — ABNORMAL HIGH (ref 11.0–14.6)
WBC: 4 10*3/uL (ref 4.0–10.3)

## 2018-02-07 LAB — IRON AND TIBC
IRON: 71 ug/dL (ref 42–163)
Saturation Ratios: 20 % — ABNORMAL LOW (ref 42–163)
TIBC: 359 ug/dL (ref 202–409)
UIBC: 288 ug/dL

## 2018-02-07 LAB — CEA (IN HOUSE-CHCC): CEA (CHCC-IN HOUSE): 8.89 ng/mL — AB (ref 0.00–5.00)

## 2018-02-07 LAB — TOTAL PROTEIN, URINE DIPSTICK: PROTEIN: NEGATIVE mg/dL

## 2018-02-07 MED ORDER — LEUCOVORIN CALCIUM INJECTION 350 MG
400.0000 mg/m2 | Freq: Once | INTRAVENOUS | Status: AC
  Administered 2018-02-07: 840 mg via INTRAVENOUS
  Filled 2018-02-07: qty 42

## 2018-02-07 MED ORDER — SODIUM CHLORIDE 0.9 % IV SOLN
2200.0000 mg/m2 | INTRAVENOUS | Status: DC
  Administered 2018-02-07: 4600 mg via INTRAVENOUS
  Filled 2018-02-07: qty 92

## 2018-02-07 MED ORDER — DEXTROSE 5 % IV SOLN
INTRAVENOUS | Status: DC
  Administered 2018-02-07: 12:00:00 via INTRAVENOUS
  Filled 2018-02-07: qty 250

## 2018-02-07 MED ORDER — SODIUM CHLORIDE 0.9% FLUSH
10.0000 mL | Freq: Once | INTRAVENOUS | Status: AC
  Administered 2018-02-07: 10 mL
  Filled 2018-02-07: qty 10

## 2018-02-07 MED ORDER — OXALIPLATIN CHEMO INJECTION 100 MG/20ML
60.0000 mg/m2 | Freq: Once | INTRAVENOUS | Status: AC
  Administered 2018-02-07: 125 mg via INTRAVENOUS
  Filled 2018-02-07: qty 5

## 2018-02-07 MED ORDER — SODIUM CHLORIDE 0.9 % IV SOLN
Freq: Once | INTRAVENOUS | Status: AC
  Administered 2018-02-07: 11:00:00 via INTRAVENOUS
  Filled 2018-02-07: qty 5

## 2018-02-07 MED ORDER — ATROPINE SULFATE 1 MG/ML IJ SOLN: 0.5000 mg | Freq: Once | INTRAMUSCULAR | Status: DC

## 2018-02-07 MED ORDER — METHYLPREDNISOLONE SODIUM SUCC 125 MG IJ SOLR
INTRAMUSCULAR | Status: AC
  Filled 2018-02-07: qty 2

## 2018-02-07 MED ORDER — METHYLPREDNISOLONE SODIUM SUCC 125 MG IJ SOLR
125.0000 mg | Freq: Once | INTRAMUSCULAR | Status: AC
  Administered 2018-02-07: 125 mg via INTRAVENOUS

## 2018-02-07 MED ORDER — PALONOSETRON HCL INJECTION 0.25 MG/5ML
INTRAVENOUS | Status: AC
  Filled 2018-02-07: qty 5

## 2018-02-07 MED ORDER — SODIUM CHLORIDE 0.9 % IV SOLN
5.0000 mg/kg | Freq: Once | INTRAVENOUS | Status: AC
  Administered 2018-02-07: 450 mg via INTRAVENOUS
  Filled 2018-02-07: qty 4

## 2018-02-07 MED ORDER — PALONOSETRON HCL INJECTION 0.25 MG/5ML
0.2500 mg | Freq: Once | INTRAVENOUS | Status: AC
  Administered 2018-02-07: 0.25 mg via INTRAVENOUS

## 2018-02-07 MED ORDER — SODIUM CHLORIDE 0.9 % IV SOLN
Freq: Once | INTRAVENOUS | Status: AC
  Administered 2018-02-07: 10:00:00 via INTRAVENOUS
  Filled 2018-02-07: qty 250

## 2018-02-07 MED ORDER — DEXTROSE 5 % IV SOLN
Freq: Once | INTRAVENOUS | Status: AC
  Administered 2018-02-07: 12:00:00 via INTRAVENOUS
  Filled 2018-02-07: qty 250

## 2018-02-07 NOTE — Progress Notes (Signed)
[place]  [phone] [phone]  Clinic Follow up Note   Patient Care Team: Curlene Labrum, MD as PCP - General (Family Medicine) 02/07/2018  SUMMARY OF ONCOLOGIC HISTORY: Oncology History   Cancer Staging Metastatic colon cancer to liver [facility]) Staging form: Colon and Rectum, AJCC 8th Edition - Clinical stage from 06/01/2016: Stage IVA (cTX, cNX, pM1a) - Signed by Truitt Merle, MD on 07/04/2016 - Pathologic stage from 06/14/2016: Stage IVA (pT4b(m), pN2b, pM1a) - Signed by Truitt Merle, MD on 07/04/2016       Metastatic colon cancer to liver ([place])   04/2015 Procedure    Colonoscopy by Dr. Ladona Horns. It showed showed 2 sessile polyps ready between 3-5 mm in size located 20 cm (A, B) from the point of entry, polypectomy was performed. Pedunculated polyp was found in the ascending colon (C), polypectomy was performed, and additional polyp (D) was found 30 cm from the point of entry, removed    04/2015 Pathology Results    tubular adenoma (A and B), and well differentiated adenocarcinoma arising from tubulovillous adenoma (C) and well differentiated adenocarcinoma arising from severe dysplasia to intramucosal carcinoma within tubular adenoma.     04/2015 Initial Diagnosis    Metastatic colon cancer to liver ([place])    05/29/2016 Imaging    CT abdomen and pelvis with contrast showed an apple core like stricture in right colon just above the cecum, measuring 3.2 cm in lengths. This is highly suspicious for malignancy. Small lymph node a noticed he had adjacent mesentery, measuring 8 mm. There is a low-density lesion within the inferior aspect of the right hepatic lobe measuring 1.7 cm, suspicious for metastasis.     06/06/2016 Tumor Marker    CEA 9.99    06/08/2016 PET scan    IMPRESSION: Approximately 3 cm hypermetabolic mass in the ascending colon, consistent with primary colon carcinoma. This mass results in colonic obstruction and small bowel dilatation.  Additional areas of hypermetabolic wall thickening in the cecum may represent other sites of colon carcinoma or colitis. Mild hypermetabolic lymphadenopathy in right pericolonic region, porta hepatis, and aortocaval space, consistent with metastatic disease. Mild hypermetabolic mediastinal lymphadenopathy also seen, and thoracic lymph node metastases cannot be excluded. Solitary hypermetabolic focus in inferior right hepatic lobe, consistent with liver metastasis. Consider abdomen MRI without and with contrast for further evaluation.    06/14/2016 Surgery    Hand assisted right hemicolectomy and small bowel resection for colon cancer, liver biopsy, by Dr. Barry Dienes    06/14/2016 Pathology Results    Right hemicolectomy showed invasive well to moderately differentiated adenocarcinoma, 2 foci measuring 7.5 cm and 4.5 cm, tumor invades through full thickness of colon, to the seroma and involve the Small Bowel, Surgical Margins Were Negative, 24 Out Of 64 Lymph Nodes Were Positive, Extracapsular Extension Identified, Multiple Satellite Tumor Deposits Present, Liver Biopsy Showed Metastatic Adenocarcinoma.      06/14/2016 Miscellaneous    Tumor MMR normal, MSI stable     06/14/2016 Miscellaneous    Foundation one genomic testing showed K-ras G12 D mutation, APC and TP53 mutation. No BRAF and NRAS mutation. MSI-stable, tumor burden low.    07/06/2016 Tumor Marker    CEA 13.69    07/13/2016 - 01/18/2017 Chemotherapy    mFOLFOX, every 2 weeks, started on 07/14/2015, Avastin added from cycle 3  Oxaliplatin dose to 76m/m2 due to side effects and some cytopenia on 09/21/16  Changed to FOLFIRINOX starting cycle 7 and Reduced  Due to  neuropathy hold Oxaliplatin and add Irinotecan with neulasta on day 3 starting with cycle 7 Add low dose Oxaliplatin with cycle 8.  Due to his worsening neuropathy, and good response to chemotherapy, I previously stopped oxaliplatin from cycle 11, and continue FOLFIRI and avastin        07/27/2016 Tumor Marker    CEA 15.85    09/04/2016 Imaging    Ct C/A/P W Contrast IMPRESSION: Interval right colectomy. Stable small liver metastasis in the inferior right hepatic lobe. Stable mild porta hepatis and aortocaval lymphadenopathy. Stable mild mediastinal lymphadenopathy. No new or progressive metastatic disease identified within the chest, abdomen, or pelvis.    12/19/2016 PET scan    IMPRESSION: 1. Right hemicolectomy, with resolution of the prior hypermetabolic activity inferiorly in the right hepatic lobe, in several mediastinal lymph nodes, and in lymph nodes in the retroperitoneum and porta hepatis. No residual hypermetabolic or enlarged lymph nodes are identified. 2. Low-grade diffuse skeletal metabolic activity is likely therapy related. 3. Coronary atherosclerosis. 4. 3 by 4 mm right middle lobe pulmonary nodule is stable, not appreciably hypermetabolic, but below sensitive PET-CT size thresholds. This may warrant surveillance.    01/11/2017 Imaging    MR Abdomen W WO Contrast IMPRESSION: 1. No acute findings within the abdomen. Previously noted liver metastasis has resolved in the interval. No new lesions.    02/01/2017 -  Chemotherapy    Maintenance therapy, Xeloda 2087m (1000 mg/m2)  q12h on day 1-14 every 21 days plus AVASTIN, starting 02/01/2017.  stopped after 12 days due to poor tolerance on 02/14/17  Changed to maintenance 5-FU and avastin every 2 weeks starting on 02/22/17      04/10/2017 Imaging    IMPRESSION: 1. Status post right hemicolectomy without findings for recurrent tumor. 2. No worrisome hepatic lesions. Treated disease with only a small residual low attenuation lesion in the right hepatic lobe. 3. No recurrent mediastinal or abdominal lymphadenopathy.     07/09/2017 PET scan    PET 07/09/17 IMPRESSION: 1. Status post right hemicolectomy, without findings of hypermetabolic recurrent or metastatic disease. 2. A right middle lobe  pulmonary nodule is unchanged. However, there is a right lower lobe 5 mm pulmonary nodule which is felt to be new and enlarged compared to prior exams. Suspicious for an isolated pulmonary metastasis. Consider CT follow-up at 3-6 months. 3. Age advanced coronary artery atherosclerosis. Recommend assessment of coronary risk factors and consideration of medical therapy. 4. Borderline ascending aortic dilatation, 4.0 cm      10/10/2017 Imaging    IMPRESSION: 1. Several (at least 10) subcentimeter pulmonary nodules scattered in both lungs, predominantly in the lower lobes, all new/increased, most compatible with enlarging pulmonary metastases, largest 8 mm in the right lower lobe. 2. No additional findings of new or progressive metastatic disease. No recurrent adenopathy. Stable small low-attenuation lesion in the inferior right liver lobe compatible with treated metastasis. No new liver metastases. 3. Stable ectatic 4.0 cm ascending thoracic aorta. Recommend annual imaging followup by CTA or MRA.  4. Stable mild splenomegaly.     01/08/2018 Imaging    01/08/2018 CT CAP IMPRESSION: 1. Progressive hepatic and pulmonary metastatic disease. 2. Ascending Aortic aneurysm NOS (ICD10-I71.9).    01/08/2018 Progression    01/08/2018 CT CAP IMPRESSION: 1. Progressive hepatic and pulmonary metastatic disease. 2. Ascending Aortic aneurysm NOS (ICD10-I71.9).   CURRENT THERAPY: FOLFIRI and Avastin every 2 weeks, change FOLFOX and Avastin on 01/10/2018 due to disease progression  INTERVAL HISTORY: Mr. CHershbergerreturns for  follow up and cycle 3 since restarting FOLFOX/avastin. He completed cycle 2 on 9/12. He takes 4 mg steroids for 2-3 days after. He has fatigue for 4-5 days following treatment. Appetite returns around day 5 then he eats very well. Continues to use alcohol as coping strategy. Sees Dr. Cheryln Manly monthly for mental health. Cold sensitivity lasts 1 day. Neuropathy in fingers and toes is  constant but does not limit function. He has lined up appointments at [facility] and [facility] and is looking into seeing oncologist at [facility] in [place] to explore other treatment options. He denies fever, chills, cough, chest pain, dyspnea, edema, mucositis, or abdominal pain.    MEDICAL HISTORY:  Past Medical History:  Diagnosis Date  . Anxiety   . Cancer of ascending colon ([place])   . Seasonal allergies     SURGICAL HISTORY: Past Surgical History:  Procedure Laterality Date  . APPENDECTOMY  1992  . CATARACT EXTRACTION W/ INTRAOCULAR LENS IMPLANT Left 04/2016  . COLON SURGERY    . COLONOSCOPY W/ BIOPSIES AND POLYPECTOMY  04/2015  . INGUINAL HERNIA REPAIR Right 1982  . IR RADIOLOGIST EVAL & MGMT  01/16/2017  . KNEE ARTHROSCOPY W/ MENISCECTOMY Right 1998  . LAPAROSCOPIC RIGHT COLECTOMY N/A 06/14/2016   Procedure: LAPAROSCOPIC HAND ASSISTED HEMICOLECTOMY AND SMALL BOWEL RESECTION.;  Surgeon: [name], MD;  Location: [place];  Service: General;  Laterality: N/A;  . LIVER BIOPSY Right 06/14/2016   Procedure: LIVER BIOPSY;  Surgeon: [name], MD;  Location: [place];  Service: General;  Laterality: Right;  Right Inferior Liver  . PORTACATH PLACEMENT N/A 07/06/2016   Procedure: INSERTION PORT-A-CATH;  Surgeon: [name], MD;  Location: [place];  Service: General;  Laterality: N/A;  . VASECTOMY  2005    I have reviewed the social history and family history with the patient and they are unchanged from previous note.  ALLERGIES:  is allergic to penicillins and xeloda [capecitabine].  MEDICATIONS:  Current Outpatient Medications  Medication Sig Dispense Refill  . amLODipine (NORVASC) 10 MG tablet Take 1 tablet (10 mg total) by mouth daily. 30 tablet 2  . celecoxib (CELEBREX) 200 MG capsule Take 1 capsule (200 mg total) by mouth daily. 60 capsule 1  . dexamethasone (DECADRON) 4 MG tablet UP TO THREE DAYS AFTER CHEMO (BEGIN DAY TWO) 15 tablet 0  .  diphenoxylate-atropine (LOMOTIL) 2.5-0.025 MG tablet Take 2 tablets by mouth 4 (four) times daily as needed for diarrhea or loose stools. 30 tablet 0  . lidocaine-prilocaine (EMLA) cream Apply to affected area once 30 g 3  . lisinopril (PRINIVIL,ZESTRIL) 10 MG tablet Take 2 tablets (20 mg total) by mouth daily. 180 tablet 2  . LORazepam (ATIVAN) 0.5 MG tablet Take 1 tablet (0.5 mg total) by mouth every 8 (eight) hours as needed (for nausea). 30 tablet 0  . Multiple Vitamin (MULTIVITAMIN WITH MINERALS) TABS tablet Take 1 tablet by mouth daily.    . ondansetron (ZOFRAN ODT) 8 MG disintegrating tablet Take 1 tablet (8 mg total) by mouth every 8 (eight) hours as needed for nausea or vomiting. 30 tablet 2  . prochlorperazine (COMPAZINE) 10 MG tablet Take 1 tablet (10 mg total) by mouth every 6 (six) hours as needed for nausea or vomiting. 30 tablet 3  . traMADol (ULTRAM) 50 MG tablet Take 1 tablet (50 mg total) by mouth every 6 (six) hours as needed. 30 tablet 0  . urea (CARMOL) 10 % cream Apply topically 3 (three) times daily. [place]  g 1  . venlafaxine XR (EFFEXOR XR) 150 MG 24 hr capsule Take 1 capsule (150 mg total) by mouth daily with breakfast. 90 capsule 1   No current facility-administered medications for this visit.    Facility-Administered Medications Ordered in Other Visits  Medication Dose Route Frequency Provider Last Rate Last Dose  . clindamycin (CLEOCIN) 900 mg in dextrose 5 % 50 mL IVPB  900 mg Intravenous 60 min Pre-Op [name], MD       And  . gentamicin (GARAMYCIN) 450 mg in dextrose 5 % 50 mL IVPB  5 mg/kg Intravenous 60 min Pre-Op [name], MD      . dexamethasone (DECADRON) injection 10 mg  10 mg Intravenous Once Harle Stanford., PA-C      . diphenoxylate-atropine (LOMOTIL) 2.5-0.025 MG per tablet 2 tablet  2 tablet Oral Once Harle Stanford., PA-C      . heparin lock flush 100 unit/mL  500 Units Intracatheter Once PRN Truitt Merle, MD      . sodium chloride flush (NS) 0.9 %  injection 10 mL  10 mL Intracatheter PRN Truitt Merle, MD      . sodium chloride flush (NS) 0.9 % injection 10 mL  10 mL Intracatheter Once Truitt Merle, MD        PHYSICAL EXAMINATION: ECOG PERFORMANCE STATUS: 1 - Symptomatic but completely ambulatory  Vitals:   02/07/18 0922  BP: (!) 121/94  Pulse: 96  Resp: 18  Temp: 97.7 F (36.5 C)  SpO2: 98%    Manual repeat 130/90   Filed Weights   02/07/18 0922  Weight: 207 lb 12.8 oz (94.3 kg)    GENERAL:alert, no distress and comfortable SKIN: no rashes or significant lesions EYES: sclera clear OROPHARYNX:no thrush or ulcers   LYMPH:  no palpable cervical or supraclavicular lymphadenopathy  LUNGS: clear to auscultation with normal breathing effort HEART: regular rate & rhythm, no lower extremity edema ABDOMEN:abdomen soft, non-tender and normal bowel sounds Musculoskeletal:no cyanosis of digits and no clubbing  NEURO: alert & oriented x 3 with fluent speech, no focal motor deficits PAC without erythema   LABORATORY DATA:  I have reviewed the data as listed CBC Latest Ref Rng & Units 02/07/2018 01/24/2018 01/10/2018  WBC 4.0 - 10.3 K/uL 4.0 5.0 4.0  Hemoglobin 13.0 - 17.1 g/dL 13.1 11.6(L) 13.1  Hematocrit 38.4 - 49.9 % 37.0(L) 34.8(L) 38.6  Platelets 140 - 400 K/uL 152 146 159     CMP Latest Ref Rng & Units 02/07/2018 01/24/2018 01/10/2018  Glucose 70 - 99 mg/dL 105(H) 163(H) 105(H)  BUN 6 - 20 mg/dL _0 Creatinine 0.61 - 1.24 mg/dL 1.02 1.02 1.01  Sodium 135 - 145 mmol/L 141 137 141  Potassium 3.5 - 5.1 mmol/L 4.4 3.8 4.3  Chloride 98 - 111 mmol/L 107 105 105  CO2 22 - 32 mmol/L 21(L) 21(L) 22  Calcium 8.9 - 10.3 mg/dL 9.3 8.6(L) 9.5  Total Protein 6.5 - 8.1 g/dL 7.5 6.4(L) 7.1  Total Bilirubin 0.3 - 1.2 mg/dL 0.3 0.4 0.3  Alkaline Phos 38 - 126 U/L 55 59 65  AST 15 - 41 U/L _1 ALT 0 - 44 U/L _2 TUMOR MARKER Results for LYNK, MARTI (MRN [id number]) as of 01/08/2018 11:33  Ref. Range 09/13/2017  11:08 10/11/2017 10:40 10/25/2017 10:41 11/14/2017 09:30 12/13/2017 11:48  CEA ([facility]) Latest Ref Range: 0.00 - 5.00 ng/mL 4.59 4.95 5.26 (H) 4.82 6.93 (H)  CEA  01/10/18: 6.99  02/07/18: 8.89  PATHOLOGY   Diagnosis 06/14/2016 1. Colon, segmental resection for tumor, Right Ascending Hemicolectomy and Small Bowel - INVASIVE WELL TO MODERATELY DIFFERENTIATED ADENOCARCINOMA. - TWO TUMOR FOCI MEASURING 7.5 CM AND 4.5 CM IN GREATEST DIMENSION. - TUMOR INVADES THROUGH FULL THICKNESS OF COLON, THROUGH THE SEROSA TO INVOLVE THE SMALL BOWEL. - MARGINS ARE NEGATIVE. - TWENTY FOUR OF SIXTY FOUR LYMPH NODES POSITIVE FOR METASTATIC ADENOCARCINOMA (24/64). - EXTRACAPSULAR EXTENSION IDENTIFIED - MULTIPLE SATELLITE TUMOR DEPOSITS PRESENT. - SEE ONCOLOGY TEMPLATE. 2. Liver, needle/core biopsy, Right Inferior - POSITIVE FOR METASTATIC ADENOCARCINOMA.        RADIOGRAPHIC STUDIES: I have personally reviewed the radiological images as listed and agreed with the findings in the report. No results found.   ASSESSMENT & PLAN: Bacilio Abascal a 57 y.o.Caucasian male, without significant past medical history, presented with rectal bleeding in December 2016, colonoscopy showed 4 polyps, 2 of them showed invasive adenocarcinoma, unfortunately he was not aware of the pathology findings and was not treated. He now presented with fatigue, weight loss, anorexia and abdominal pain  1. Right colon cancer with liver, node and probable lung metastasis, [id number], stage IV, MSI-stable, KRAS G12D mutation (+) 2. Depression, anxiety - now seeing Dr. Cheryln Manly, on effexor xr 150 mg  3. HTN 4. Anemia of iron deficiency and chemo  5. Peripheral neuropathy, G1 6. Goal of care discussion   Mr. Holzhauer appears stable. He completed 2 cycles FOLFOX/avastin, he has moderate fatigue and low appetite lasting 4-5 days. He has recovered well. He will continue 4 mg dexamethasone daily x2-3 days after chemo.  Mild neuropathy is stable.   Labs reviewed, CBC and CMP stable, iron studies adequate. CEA slightly elevated. Labs overall adequate to proceed with cycle 3. He will return for follow up in 2 weeks with cycle 4.   PLAN: -Labs reviewed, proceed with next cycle FOLFOX/avastin  -Return in 2 weeks for f/u and next cycle (4th since restarting FOLFOX)  All questions were answered. The patient knows to call the clinic with any problems, questions or concerns. No barriers to learning was detected.     Alla Feeling, NP 02/07/18

## 2018-02-07 NOTE — Progress Notes (Signed)
Per Lacie NP ok to treat with Avastin with BP 130/90. Pt. denies chest pain, dizziness, and no shortness of breath noted.

## 2018-02-07 NOTE — Patient Instructions (Signed)
[facility] Discharge Instructions for Patients Receiving Chemotherapy  Today you received the following chemotherapy agents: Avastin, Oxaliplatin, Leucovorin, 5FU  To help prevent nausea and vomiting after your treatment, we encourage you to take your nausea medication as directed.   If you develop nausea and vomiting that is not controlled by your nausea medication, call the clinic.   BELOW ARE SYMPTOMS THAT SHOULD BE REPORTED IMMEDIATELY:  *FEVER GREATER THAN 100.5 F  *CHILLS WITH OR WITHOUT FEVER  NAUSEA AND VOMITING THAT IS NOT CONTROLLED WITH YOUR NAUSEA MEDICATION  *UNUSUAL SHORTNESS OF BREATH  *UNUSUAL BRUISING OR BLEEDING  TENDERNESS IN MOUTH AND THROAT WITH OR WITHOUT PRESENCE OF ULCERS  *URINARY PROBLEMS  *BOWEL PROBLEMS  UNUSUAL RASH Items with * indicate a potential emergency and should be followed up as soon as possible.  Feel free to call the clinic should you have any questions or concerns. The clinic phone number is [phone].  Please show the CHEMO ALERT CARD at check-in to the Emergency Department and triage nurse.       

## 2018-02-08 ENCOUNTER — Telehealth: Payer: Self-pay | Admitting: Hematology

## 2018-02-08 LAB — FERRITIN: FERRITIN: 136 ng/mL (ref 24–336)

## 2018-02-08 NOTE — Telephone Encounter (Signed)
Appts scheduled pt notified per 9/26 los

## 2018-02-09 ENCOUNTER — Inpatient Hospital Stay: Payer: BLUE CROSS/BLUE SHIELD

## 2018-02-09 VITALS — BP 129/86 | HR 71 | Temp 98.1°F | Resp 18

## 2018-02-09 DIAGNOSIS — C189 Malignant neoplasm of colon, unspecified: Secondary | ICD-10-CM

## 2018-02-09 DIAGNOSIS — C182 Malignant neoplasm of ascending colon: Secondary | ICD-10-CM | POA: Diagnosis not present

## 2018-02-09 DIAGNOSIS — C787 Secondary malignant neoplasm of liver and intrahepatic bile duct: Principal | ICD-10-CM

## 2018-02-09 MED ORDER — SODIUM CHLORIDE 0.9% FLUSH
10.0000 mL | INTRAVENOUS | Status: DC | PRN
  Administered 2018-02-09: 10 mL
  Filled 2018-02-09: qty 10

## 2018-02-09 MED ORDER — HEPARIN SOD (PORK) LOCK FLUSH 100 UNIT/ML IV SOLN
500.0000 [IU] | Freq: Once | INTRAVENOUS | Status: AC | PRN
  Administered 2018-02-09: 500 [IU]
  Filled 2018-02-09: qty 5

## 2018-02-09 NOTE — Patient Instructions (Signed)
Implanted Port Home Guide An implanted port is a type of central line that is placed under the skin. Central lines are used to provide IV access when treatment or nutrition needs to be given through a person's veins. Implanted ports are used for long-term IV access. An implanted port may be placed because:  You need IV medicine that would be irritating to the small veins in your hands or arms.  You need long-term IV medicines, such as antibiotics.  You need IV nutrition for a long period.  You need frequent blood draws for lab tests.  You need dialysis.  Implanted ports are usually placed in the chest area, but they can also be placed in the upper arm, the abdomen, or the leg. An implanted port has two main parts:  Reservoir. The reservoir is round and will appear as a small, raised area under your skin. The reservoir is the part where a needle is inserted to give medicines or draw blood.  Catheter. The catheter is a thin, flexible tube that extends from the reservoir. The catheter is placed into a large vein. Medicine that is inserted into the reservoir goes into the catheter and then into the vein.  How will I care for my incision site? Do not get the incision site wet. Bathe or shower as directed by your health care provider. How is my port accessed? Special steps must be taken to access the port:  Before the port is accessed, a numbing cream can be placed on the skin. This helps numb the skin over the port site.  Your health care provider uses a sterile technique to access the port. ? Your health care provider must put on a mask and sterile gloves. ? The skin over your port is cleaned carefully with an antiseptic and allowed to dry. ? The port is gently pinched between sterile gloves, and a needle is inserted into the port.  Only "non-coring" port needles should be used to access the port. Once the port is accessed, a blood return should be checked. This helps ensure that the port  is in the vein and is not clogged.  If your port needs to remain accessed for a constant infusion, a clear (transparent) bandage will be placed over the needle site. The bandage and needle will need to be changed every week, or as directed by your health care provider.  Keep the bandage covering the needle clean and dry. Do not get it wet. Follow your health care provider's instructions on how to take a shower or bath while the port is accessed.  If your port does not need to stay accessed, no bandage is needed over the port.  What is flushing? Flushing helps keep the port from getting clogged. Follow your health care provider's instructions on how and when to flush the port. Ports are usually flushed with saline solution or a medicine called heparin. The need for flushing will depend on how the port is used.  If the port is used for intermittent medicines or blood draws, the port will need to be flushed: ? After medicines have been given. ? After blood has been drawn. ? As part of routine maintenance.  If a constant infusion is running, the port may not need to be flushed.  How long will my port stay implanted? The port can stay in for as long as your health care provider thinks it is needed. When it is time for the port to come out, surgery will be   done to remove it. The procedure is similar to the one performed when the port was put in. When should I seek immediate medical care? When you have an implanted port, you should seek immediate medical care if:  You notice a bad smell coming from the incision site.  You have swelling, redness, or drainage at the incision site.  You have more swelling or pain at the port site or the surrounding area.  You have a fever that is not controlled with medicine.  This information is not intended to replace advice given to you by your health care provider. Make sure you discuss any questions you have with your health care provider. Document  Released: 05/01/2005 Document Revised: 10/07/2015 Document Reviewed: 01/06/2013 Elsevier Interactive Patient Education  2017 [employer]  

## 2018-02-15 ENCOUNTER — Ambulatory Visit (INDEPENDENT_AMBULATORY_CARE_PROVIDER_SITE_OTHER): Payer: BLUE CROSS/BLUE SHIELD | Admitting: Psychology

## 2018-02-15 DIAGNOSIS — F4323 Adjustment disorder with mixed anxiety and depressed mood: Secondary | ICD-10-CM | POA: Diagnosis not present

## 2018-02-20 NOTE — Progress Notes (Signed)
[place]  [phone] [phone]  Clinic Follow Up Note  Patient Care Team: Curlene Labrum, MD as PCP - General (Family Medicine)   Date of Service:  02/21/2018   CHIEF COMPLAINTS:  Follow up metastatic right colon cancer   Oncology History   Cancer Staging Metastatic colon cancer to liver [facility]) Staging form: Colon and Rectum, AJCC 8th Edition - Clinical stage from 06/01/2016: Stage IVA (cTX, cNX, pM1a) - Signed by Truitt Merle, MD on 07/04/2016 - Pathologic stage from 06/14/2016: Stage IVA (pT4b(m), pN2b, pM1a) - Signed by Truitt Merle, MD on 07/04/2016       Metastatic colon cancer to liver ([place])   04/2015 Procedure    Colonoscopy by Dr. Ladona Horns. It showed showed 2 sessile polyps ready between 3-5 mm in size located 20 cm (A, B) from the point of entry, polypectomy was performed. Pedunculated polyp was found in the ascending colon (C), polypectomy was performed, and additional polyp (D) was found 30 cm from the point of entry, removed    04/2015 Pathology Results    tubular adenoma (A and B), and well differentiated adenocarcinoma arising from tubulovillous adenoma (C) and well differentiated adenocarcinoma arising from severe dysplasia to intramucosal carcinoma within tubular adenoma.     04/2015 Initial Diagnosis    Metastatic colon cancer to liver ([place])    05/29/2016 Imaging    CT abdomen and pelvis with contrast showed an apple core like stricture in right colon just above the cecum, measuring 3.2 cm in lengths. This is highly suspicious for malignancy. Small lymph node a noticed he had adjacent mesentery, measuring 8 mm. There is a low-density lesion within the inferior aspect of the right hepatic lobe measuring 1.7 cm, suspicious for metastasis.     06/06/2016 Tumor Marker    CEA 9.99    06/08/2016 PET scan    IMPRESSION: Approximately 3 cm hypermetabolic mass in the ascending colon, consistent with primary colon carcinoma. This mass  results in colonic obstruction and small bowel dilatation. Additional areas of hypermetabolic wall thickening in the cecum may represent other sites of colon carcinoma or colitis. Mild hypermetabolic lymphadenopathy in right pericolonic region, porta hepatis, and aortocaval space, consistent with metastatic disease. Mild hypermetabolic mediastinal lymphadenopathy also seen, and thoracic lymph node metastases cannot be excluded. Solitary hypermetabolic focus in inferior right hepatic lobe, consistent with liver metastasis. Consider abdomen MRI without and with contrast for further evaluation.    06/14/2016 Surgery    Hand assisted right hemicolectomy and small bowel resection for colon cancer, liver biopsy, by Dr. Barry Dienes    06/14/2016 Pathology Results    Right hemicolectomy showed invasive well to moderately differentiated adenocarcinoma, 2 foci measuring 7.5 cm and 4.5 cm, tumor invades through full thickness of colon, to the seroma and involve the Small Bowel, Surgical Margins Were Negative, 24 Out Of 64 Lymph Nodes Were Positive, Extracapsular Extension Identified, Multiple Satellite Tumor Deposits Present, Liver Biopsy Showed Metastatic Adenocarcinoma.      06/14/2016 Miscellaneous    Tumor MMR normal, MSI stable     06/14/2016 Miscellaneous    Foundation one genomic testing showed K-ras G12 D mutation, APC and TP53 mutation. No BRAF and NRAS mutation. MSI-stable, tumor burden low.    07/06/2016 Tumor Marker    CEA 13.69    07/13/2016 - 01/18/2017 Chemotherapy    mFOLFOX, every 2 weeks, started on 07/14/2015, Avastin added from cycle 3  Oxaliplatin dose to '60mg'$ /m2 due to side effects and some cytopenia on  09/21/16  Changed to FOLFIRINOX starting cycle 7 and Reduced  Due to neuropathy hold Oxaliplatin and add Irinotecan with neulasta on day 3 starting with cycle 7 Add low dose Oxaliplatin with cycle 8.  Due to his worsening neuropathy, and good response to chemotherapy, I previously stopped  oxaliplatin from cycle 11, and continue FOLFIRI and avastin       07/27/2016 Tumor Marker    CEA 15.85    09/04/2016 Imaging    Ct C/A/P W Contrast IMPRESSION: Interval right colectomy. Stable small liver metastasis in the inferior right hepatic lobe. Stable mild porta hepatis and aortocaval lymphadenopathy. Stable mild mediastinal lymphadenopathy. No new or progressive metastatic disease identified within the chest, abdomen, or pelvis.    12/19/2016 PET scan    IMPRESSION: 1. Right hemicolectomy, with resolution of the prior hypermetabolic activity inferiorly in the right hepatic lobe, in several mediastinal lymph nodes, and in lymph nodes in the retroperitoneum and porta hepatis. No residual hypermetabolic or enlarged lymph nodes are identified. 2. Low-grade diffuse skeletal metabolic activity is likely therapy related. 3. Coronary atherosclerosis. 4. 3 by 4 mm right middle lobe pulmonary nodule is stable, not appreciably hypermetabolic, but below sensitive PET-CT size thresholds. This may warrant surveillance.    01/11/2017 Imaging    MR Abdomen W WO Contrast IMPRESSION: 1. No acute findings within the abdomen. Previously noted liver metastasis has resolved in the interval. No new lesions.    02/01/2017 - 09/2017 Chemotherapy    Maintenance therapy, Xeloda '2000mg'$  (1000 mg/m2)  q12h on day 1-14 every 21 days plus AVASTIN, starting 02/01/2017.  stopped after 12 days due to poor tolerance on 02/14/17  Changed to maintenance 5-FU and avastin every 2 weeks starting on 02/22/17, stopped in 09/2017.       04/10/2017 Imaging    IMPRESSION: 1. Status post right hemicolectomy without findings for recurrent tumor. 2. No worrisome hepatic lesions. Treated disease with only a small residual low attenuation lesion in the right hepatic lobe. 3. No recurrent mediastinal or abdominal lymphadenopathy.     07/09/2017 PET scan    PET 07/09/17 IMPRESSION: 1. Status post right hemicolectomy,  without findings of hypermetabolic recurrent or metastatic disease. 2. A right middle lobe pulmonary nodule is unchanged. However, there is a right lower lobe 5 mm pulmonary nodule which is felt to be new and enlarged compared to prior exams. Suspicious for an isolated pulmonary metastasis. Consider CT follow-up at 3-6 months. 3. Age advanced coronary artery atherosclerosis. Recommend assessment of coronary risk factors and consideration of medical therapy. 4. Borderline ascending aortic dilatation, 4.0 cm      10/10/2017 Imaging    IMPRESSION: 1. Several (at least 10) subcentimeter pulmonary nodules scattered in both lungs, predominantly in the lower lobes, all new/increased, most compatible with enlarging pulmonary metastases, largest 8 mm in the right lower lobe. 2. No additional findings of new or progressive metastatic disease. No recurrent adenopathy. Stable small low-attenuation lesion in the inferior right liver lobe compatible with treated metastasis. No new liver metastases. 3. Stable ectatic 4.0 cm ascending thoracic aorta. Recommend annual imaging followup by CTA or MRA.  4. Stable mild splenomegaly.     10/11/2017 - 12/2017 Chemotherapy    FOLFIRI and Avastin every 2 weeks starting 10/11/17, irnotecan stopped in 12/2017 due to disease progression      01/08/2018 Imaging    01/08/2018 CT CAP IMPRESSION: 1. Progressive hepatic and pulmonary metastatic disease. 2. Ascending Aortic aneurysm NOS (ICD10-I71.9).    01/08/2018 Progression  01/08/2018 CT CAP IMPRESSION: 1. Progressive hepatic and pulmonary metastatic disease. 2. Ascending Aortic aneurysm NOS (ICD10-I71.9).    01/10/2018 -  Chemotherapy    FOLFOX and Avastin every 2 weeks starting on 01/10/2018.      HISTORY OF PRESENTING ILLNESS (06/01/2016):  Nicholas Lowery 57 y.o. male is here because of his recently abdominal CT which is highly suspicious for metastatic colon cancer. He is accompanied by his wife to  my clinic today. He was referred by his primary care physician Dr. Pleas Koch.   He had colonoscopy in 2016 for mild rectal bleeding, he describe small amount fresh blood mixed with stool, he has no other constitutional symptoms at that time. He was referred to gastroenterologist Dr. Ladona Horns and underwent a colonoscopy in December 2016. The colonoscopy showed 2 sessile polyps ready between 3-5 mm in size located 20 cm (A, B) from the point of entry, polypectomy was performed. Pedunculated polyp was found in the ascending colon (C), polypectomy was performed, and additional polyp (D) was found 30 cm from the point of entry, removed. The pathology reviewed tubular adenoma (A and B), and well differentiated adenocarcinoma arising from tubulovillous adenoma (C) and well differentiated adenocarcinoma arising from severe dysplasia to intramucosal carcinoma within tubular adenoma. Dr. Tye Maryland tried multiple times to reach patient, but patient sought they were calling him about the bill, and did not return the phone calls. He was not aware the cancer diagnosis until recently.   He started having diarrhea and vomiting in mid Dec 2017, and felt a "pop" in right side abdomen, he was seen at urgent care, and was treated with antiemetics, and IVF, lab tests were OK. Due to his persistent intermittent diarrhea and epigastric pain since then, he was seen by PCP and he eventually had CT abdomen and pelvis scan which showed a upper core lesion in the ascending colon, and I'll 1.7 cm lesion in the liver, highly suspicious for metastasis. He was referred to [place] for further evaluation.  He has lost 30 lbs in the past one month, has low appetite, eats a small meals 1-2 times a day. He has moderate fatigue, able to tolerate routine activities including his work, but feels exhausted at the end of study. He has occasional constipation, denies recent rectal bleeding.  CURRENT THERAPY:  Second line FOLFOX and Avastin started on 01/10/2018    INTERIM HISTORY:  Nicholas Lowery is a 57 y.o. male who returns today for follow up and treatment. He presents to the clinic today alone. He is doing well. He went to [facility] to further discuss treatment options and clinical trials. He complains of intermittent abdominal pain that feels like "a sour stomach" that is worse when he wakes up or eats spicy food. He also noticed a left axillary mass, that is mildly tender to touch.     MEDICAL HISTORY:  Past Medical History:  Diagnosis Date  . Anxiety   . Cancer of ascending colon ([place])   . Seasonal allergies     SURGICAL HISTORY: Past Surgical History:  Procedure Laterality Date  . APPENDECTOMY  1992  . CATARACT EXTRACTION W/ INTRAOCULAR LENS IMPLANT Left 04/2016  . COLON SURGERY    . COLONOSCOPY W/ BIOPSIES AND POLYPECTOMY  04/2015  . INGUINAL HERNIA REPAIR Right 1982  . IR RADIOLOGIST EVAL & MGMT  01/16/2017  . KNEE ARTHROSCOPY W/ MENISCECTOMY Right 1998  . LAPAROSCOPIC RIGHT COLECTOMY N/A 06/14/2016   Procedure: LAPAROSCOPIC HAND ASSISTED HEMICOLECTOMY AND SMALL BOWEL RESECTION.;  Surgeon: Dorris Fetch  Barry Dienes, MD;  Location: [place];  Service: General;  Laterality: N/A;  . LIVER BIOPSY Right 06/14/2016   Procedure: LIVER BIOPSY;  Surgeon: [name], MD;  Location: [place];  Service: General;  Laterality: Right;  Right Inferior Liver  . PORTACATH PLACEMENT N/A 07/06/2016   Procedure: INSERTION PORT-A-CATH;  Surgeon: [name], MD;  Location: [place];  Service: General;  Laterality: N/A;  . VASECTOMY  2005    SOCIAL HISTORY: Social History   Socioeconomic History  . Marital status: Married    Spouse name: Not on file  . Number of children: Not on file  . Years of education: Not on file  . Highest education level: Not on file  Occupational History  . Not on file  Social Needs  . Financial resource strain: Not on file  . Food insecurity:    Worry: Not on file    Inability: Not on file  . Transportation  needs:    Medical: Not on file    Non-medical: Not on file  Tobacco Use  . Smoking status: Former Smoker    Years: 2.00    Types: Cigarettes    Last attempt to quit: 1990    Years since quitting: 29.7  . Smokeless tobacco: Never Used  Substance and Sexual Activity  . Alcohol use: Yes    Alcohol/week: 4.0 standard drinks    Types: 4 Cans of beer per week    Comment: social, none since colon surgery  . Drug use: No    Comment: 06/15/2016 "nothing since college"  . Sexual activity: Yes  Lifestyle  . Physical activity:    Days per week: Not on file    Minutes per session: Not on file  . Stress: Not on file  Relationships  . Social connections:    Talks on phone: Not on file    Gets together: Not on file    Attends religious service: Not on file    Active member of club or organization: Not on file    Attends meetings of clubs or organizations: Not on file    Relationship status: Not on file  . Intimate partner violence:    Fear of current or ex partner: Not on file    Emotionally abused: Not on file    Physically abused: Not on file    Forced sexual activity: Not on file  Other Topics Concern  . Not on file  Social History Narrative  . Not on file   He is married. They have 3 boys, 9-12 yo. He works for a [employer], desk job.   FAMILY HISTORY: Family History  Problem Relation Age of Onset  . Cancer Mother        lung cancer  . Stroke Mother   . Hypertension Father   . CAD Father   . Cancer Maternal Grandfather        prostate cancer     ALLERGIES:  is allergic to penicillins and xeloda [capecitabine].  MEDICATIONS:  Current Outpatient Medications  Medication Sig Dispense Refill  . amLODipine (NORVASC) 10 MG tablet Take 1 tablet (10 mg total) by mouth daily. 30 tablet 2  . celecoxib (CELEBREX) 200 MG capsule Take 1 capsule (200 mg total) by mouth daily. 60 capsule 1  . dexamethasone (DECADRON) 4 MG tablet UP TO THREE DAYS AFTER CHEMO (BEGIN DAY TWO)  15 tablet 0  . diphenoxylate-atropine (LOMOTIL) 2.5-0.025 MG tablet Take 2 tablets by mouth 4 (four) times daily as needed  for diarrhea or loose stools. 30 tablet 0  . lidocaine-prilocaine (EMLA) cream Apply to affected area once 30 g 3  . lisinopril (PRINIVIL,ZESTRIL) 10 MG tablet Take 2 tablets (20 mg total) by mouth daily. 180 tablet 2  . LORazepam (ATIVAN) 0.5 MG tablet Take 1 tablet (0.5 mg total) by mouth every 8 (eight) hours as needed (for nausea). 30 tablet 0  . Multiple Vitamin (MULTIVITAMIN WITH MINERALS) TABS tablet Take 1 tablet by mouth daily.    . ondansetron (ZOFRAN ODT) 8 MG disintegrating tablet Take 1 tablet (8 mg total) by mouth every 8 (eight) hours as needed for nausea or vomiting. 30 tablet 2  . prochlorperazine (COMPAZINE) 10 MG tablet Take 1 tablet (10 mg total) by mouth every 6 (six) hours as needed for nausea or vomiting. 30 tablet 3  . traMADol (ULTRAM) 50 MG tablet Take 1 tablet (50 mg total) by mouth every 6 (six) hours as needed. 30 tablet 0  . urea (CARMOL) 10 % cream Apply topically 3 (three) times daily. 71 g 1  . venlafaxine XR (EFFEXOR XR) 150 MG 24 hr capsule Take 1 capsule (150 mg total) by mouth daily with breakfast. 90 capsule 1  . omeprazole (PRILOSEC) 20 MG capsule Take 1 capsule (20 mg total) by mouth 2 (two) times daily before a meal. 60 capsule 2   Current Facility-Administered Medications  Medication Dose Route Frequency Provider Last Rate Last Dose  . Influenza vac split quadrivalent PF (FLUARIX) injection 0.5 mL  0.5 mL Intramuscular Once Truitt Merle, MD       Facility-Administered Medications Ordered in Other Visits  Medication Dose Route Frequency Provider Last Rate Last Dose  . clindamycin (CLEOCIN) 900 mg in dextrose 5 % 50 mL IVPB  900 mg Intravenous 60 min Pre-Op [name], MD       And  . gentamicin (GARAMYCIN) 450 mg in dextrose 5 % 50 mL IVPB  5 mg/kg Intravenous 60 min Pre-Op [name], MD      . dexamethasone (DECADRON) injection  10 mg  10 mg Intravenous Once Harle Stanford., PA-C      . diphenoxylate-atropine (LOMOTIL) 2.5-0.025 MG per tablet 2 tablet  2 tablet Oral Once Harle Stanford., PA-C      . heparin lock flush 100 unit/mL  500 Units Intracatheter Once PRN Truitt Merle, MD      . sodium chloride flush (NS) 0.9 % injection 10 mL  10 mL Intracatheter PRN Truitt Merle, MD      . sodium chloride flush (NS) 0.9 % injection 10 mL  10 mL Intracatheter Once Truitt Merle, MD         REVIEW OF SYSTEMS:  Constitutional: Denies fevers, chills or abnormal night sweats Eyes: Denies blurriness of vision, double vision or watery eyes Ears, nose, mouth, throat, and face:  Respiratory:negative  Cardiovascular: Denies palpitation, chest discomfort or lower extremity swelling Gastrointestinal:  Denies change in bowel habits   (+) intermittent abdominal pain Lymphatics: Denies new lymphadenopathy or easy bruising Neurological: No new or worsening neuropathy  MSK: No new joint pain or myalgia Behavioral/Psych: (+) depression and anxiety All other systems were reviewed with the patient and are negative.  PHYSICAL EXAMINATION:  ECOG PERFORMANCE STATUS: 1 - Symptomatic but completely ambulatory Vitals:   02/21/18 0843  BP: (!) 129/94  Pulse: 94  Resp: 17  Temp: 98.1 F (36.7 C)  SpO2: 100%    GENERAL:alert, no distress and comfortable. SKIN: skin color, texture, turgor are normal,  no rashes or significant lesions (+) left axillary mass, likely a boil, mildly tender to touch EYES: normal, conjunctiva are pink and non-injected, sclera clear OROPHARYNX:no exudate, no erythema and lips, buccal mucosa, and tongue normal  NECK: supple, thyroid normal size, non-tender, without nodularity LYMPH:  no palpable lymphadenopathy in the cervical, axillary or inguinal LUNGS: clear to auscultation and percussion with normal breathing effort HEART: regular rate & rhythm and no murmurs and no lower extremity edema ABDOMEN:abdomen soft, Surgical  scar in the midline around the umbilical has healed well. non-tender and normal bowel sounds Musculoskeletal:no cyanosis of digits and no clubbing  PSYCH: alert & oriented x 3 with fluent speech (+) stressed out and emotional NEURO: no focal motor/sensory deficits   LABORATORY DATA:  I have reviewed the data as listed CBC Latest Ref Rng & Units 02/21/2018 02/07/2018 01/24/2018  WBC 4.0 - 10.5 K/uL 4.3 4.0 5.0  Hemoglobin 13.0 - 17.0 g/dL 13.0 13.1 11.6(L)  Hematocrit 39.0 - 52.0 % 38.4(L) 37.0(L) 34.8(L)  Platelets 150 - 400 K/uL 122(L) 152 146   CMP Latest Ref Rng & Units 02/21/2018 02/07/2018 01/24/2018  Glucose 70 - 99 mg/dL 100(H) 105(H) 163(H)  BUN 6 - 20 mg/dL '10 13 15  '$ Creatinine 0.61 - 1.24 mg/dL 1.13 1.02 1.02  Sodium 135 - 145 mmol/L 139 141 137  Potassium 3.5 - 5.1 mmol/L 4.6 4.4 3.8  Chloride 98 - 111 mmol/L 103 107 105  CO2 22 - 32 mmol/L 23 21(L) 21(L)  Calcium 8.9 - 10.3 mg/dL 9.1 9.3 8.6(L)  Total Protein 6.5 - 8.1 g/dL 7.1 7.5 6.4(L)  Total Bilirubin 0.3 - 1.2 mg/dL 0.5 0.3 0.4  Alkaline Phos 38 - 126 U/L 61 55 59  AST 15 - 41 U/L '20 20 20  '$ ALT 0 - 44 U/L '18 24 17   '$ TUMOR MARKER Results for Nicholas Lowery, Nicholas Lowery (MRN [id number]) as of 02/15/2018 16:32  Ref. Range 11/14/2017 09:30 12/13/2017 11:48 01/10/2018 07:59 02/07/2018 08:53  CEA ([facility]) Latest Ref Range: 0.00 - 5.00 ng/mL 4.82 6.93 (H) 6.99 (H) 8.89 (H)    PATHOLOGY   Diagnosis 06/14/2016 1. Colon, segmental resection for tumor, Right Ascending Hemicolectomy and Small Bowel - INVASIVE WELL TO MODERATELY DIFFERENTIATED ADENOCARCINOMA. - TWO TUMOR FOCI MEASURING 7.5 CM AND 4.5 CM IN GREATEST DIMENSION. - TUMOR INVADES THROUGH FULL THICKNESS OF COLON, THROUGH THE SEROSA TO INVOLVE THE SMALL BOWEL. - MARGINS ARE NEGATIVE. - TWENTY FOUR OF SIXTY FOUR LYMPH NODES POSITIVE FOR METASTATIC ADENOCARCINOMA (24/64). - EXTRACAPSULAR EXTENSION IDENTIFIED - MULTIPLE SATELLITE TUMOR DEPOSITS PRESENT. - SEE ONCOLOGY  TEMPLATE. 2. Liver, needle/core biopsy, Right Inferior - POSITIVE FOR METASTATIC ADENOCARCINOMA.    RADIOGRAPHIC STUDIES: I have personally reviewed his outside CT scan from 05/29/2016 and agreed with the findings in the report.  01/08/2018 CT CAP IMPRESSION: 1. Progressive hepatic and pulmonary metastatic disease. 2. Ascending Aortic aneurysm NOS (ICD10-I71.9).   MR Abdomen W WO Contrast 01/11/2017 IMPRESSION: 1. No acute findings within the abdomen. Previously noted liver metastasis has resolved in the interval. No new lesions.  CT CAP 09/04/16 IMPRESSION: Interval right colectomy. Stable small liver metastasis in the inferior right hepatic lobe. Stable mild porta hepatis and aortocaval lymphadenopathy. Stable mild mediastinal lymphadenopathy. No new or progressive metastatic disease identified within the chest, abdomen, or pelvis.  ASSESSMENT & PLAN:   Nicholas Lowery is a 57 y.o. Caucasian male, without significant past medical history, presented with rectal bleeding in December 2016, colonoscopy showed 4 polyps, 2 of them showed  invasive adenocarcinoma, unfortunately he was not aware of the pathology findings and was not treated. He now presented with fatigue, weight loss, anorexia and abdominal pain  1. Right colon cancer with liver, node and probable lung metastasis, [id number], stage IV, MSI-stable, KRAS G12D mutation (+) -I previously reviewed her PET scan findings, which showed solitary liver metastasis, abdominal and possible thoracic node metastasis  -His liver biopsy confirmed metastasis -Due to the bowel obstruction, he underwent upfront hemicolectomy. I previously reviewed his surgical pathology findings, which showed 2 primary right colon cancer, very locally advanced with 24 lymph nodes positive, surgical margins were negative. -I previously discussed his [facility] one genomic testing results, which showed care arrest mutation, and as high stable, low tumor  burden. So he would not benefit from EGFR inhibitor, or immunotherapy alone. -The was started on palliative first-line chemotherapy FOLFOX on 07/13/16. Avastin was added from cycle 3, which was changed to FOLFIRINOX starting with cycle 7 with low dose oxaliplatin. He had near complete metabolic response from chemotherapy, post his liver metastasis and hypermetabolic lymph nodes are negative on PET scan with decreased size.  Liver targeted therapy was discussed, but not offered due to the no evidence disease on the MRI. -His chemo was subsequently changed to Xeloda and Avastin maintenance therapy 02/01/17, due to very poor tolerance he stopped Xeloda 02/14/17 -he has been on  5-Fu pump and avastin starting 02/22/17.  -We previously discussed his PET from 07/09/17 which shows a stable and a new small nodule in the lungs, indeterminate cause at this time. I encouraged him to continue with his current maintenance chemo regimen. Per Pt request I will give him a month of chemo break after 07/12/17 cycle of chemo.  -He was started on FOLFIRI on 10/11/17 and continued avastin every 2 weeks, due to disease progression in lungs.  -His restaging CT on January 08, 2018 unfortunately showed enlarged liver metastasis from 1.0 cm to 1.6 cm, and several subcentimeter pulmonary nodules scattered in both lungs, new or increased from prior scan, most compatible with worsening pulmonary metastasis. No other new lesions.  Clinically doing well. -FOLFIRI was changed to FOLFOX with dose reduction, he is tolerating well overall  -We previously discussed the overall prognosis at this point is poor due to the disease progression, and he has had multiple line treatment.  He does have a few lines of other FDA approved treatment options, such as loss of and regorafenib, however overall response rates are low. -I also discussed different clinical trials at different locations.  He is young, asymptomatic, with good performance status and organ  functions, will be a good candidate for clinical trial. He is interested. I will contact the institutions and inform the patient with the suitable place. He recently went to [facility] and saw Dr. Jimmy Footman but there is no suitable clinical trial right now at [facility].  -I previously switched chemo to FOLFOX.  Due to the potential allergy reaction, I will add Solu-Medrol as premedication, and extend the oxaliplatin infusion to 3 hours. -Lab reviewed, Hg 13.0, WBC 4.3K, Platelet 122K, will proceed cycle 4 FOLFOX today and continue every 2 weeks -His recent tumor marker slightly increased, concerning for disease progression, I will scan him after cycle 5 of FOLFOX -We again discussed clinical trial options, [facility] may have a suitable clinical trial opening in the next months, I will touch base with them if patient needs a clinical trial. He is willing to go.  -Follow-up in one month with restaging  scan before  -Continue chemo every 2 weeks    2. Depression, Anxiety  -Pt has long-standing history of depression, has been on Prozac for decades, it was very well controlled in the past.   -He has become more depressed lately, due to the stress from cancer diagnosis, especially stage IV, with prolonged chemotherapy, stress from work and family responsibility, etc.  He has developed multiple symptoms, including depressed mood, insomnia, personality change, etc.  He also has suicidal ideas. -He has good family and social support.   -We previously had a long discussion about depression management.  -I previously discussed the role of antidepressants, he has been on Prozac 40 mg for several years. I suggest changing to Effexor. He has weaned off Prozac in 2 weeks. Started Effexor '75mg'$  on week two of taper, will titrate dose if needed. -I also suggested he start attending GI Cancer support group, I had GI Navigator Dawn speak with him more about this and other resources. Dawn recommend him to see psychologist Earl Lites, he has met her twice, but does not feel it is very helpful. -His mood has improved, but he still has episodes of crying, and anger. He is on Effexor 150 mg daily, will continue. -He broke into tears when we discussed his previous CT scan findings and disease progression. He has good social support from his family.  Emotional support provided. I previously contacted social worker Lauren to meet with him.  -He copes by drinking more alcohol. I have discouraged him to drink alcohol -He states that he sleeps to cope with stress and bothering thoughts.   3. Hypertension  -We previously discussed Avastin can cause hypertension -I previously started him on amlodipine, and increase dose to 10 mg daily -He does not want to follow-up with his primary care physician. -Previously, his blood pressure was noted to be elevated, especially diastolic. I previously added on lisinopril 10 mg daily for him -Continue Norvasc and Lisinopril for now while on Avastin -Will continue to monitor -BP better controlled with Lisinopril and amlodipine - I previously recommended him to monitor his blood pressure at home, and increase lisinopril from 10 mg to 20 mg. - Continue lisinopril and Amlodapine.   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 -I previously recommend him to take oral iron supplement over-the-counter, potential side effects of constipation and gastric or discomfort or discussed with him. He stopped taking this before surgery. -I previously advised him to begin taking oral iron supplements again post surgery -Hg normal and stable at 13.0 and iron study has been normal lately. Will continue to monitor.   5. Anorexia and weight loss secondary to chemo  -Secondary to underlying malignancy -I previously encouraged him to try nutritional supplement, such as boost or initial and try to eat more -He was encouraged to continue eating well and  take supplemental drinks as needed to better maintain weight.  -I previously discussed how his depression can effect his appetite and to make sure he is eating and drinking well.  -He has been seen by dietitian  -I again encouraged him to take a nutritional supplement  6. Peripheral neuropathy, G1  -Secondary to oxaliplatin, dose reduced and ultimately discontinued -Slightly worsened when he was on FOLFIRI, held Irinotecan beginning 01/04/17 -I previously discussed with patient this may take some time to resolve.  -I strongly encouraged him to take vitamin B-complex supplements -Hand function normalized but overall slightly  worse numbness on his toes, will continue to monitor.   7. Goal of care discussion  -We previously discussed the incurable nature of his cancer, and the overall poor prognosis, especially if he does not have good response to chemotherapy or progress on chemo -The patient understands the goal of care is palliative.  -He is full code now     Plan  -Lab reviewed, adequate for treatment, will proceed to cycle 4 FOLFOX today, and continue every 2 weeks  -follow-up in one month with restaging CT scan   Orders Placed This Encounter  Procedures  . CT Abdomen Pelvis W Contrast    Standing Status:   Future    Standing Expiration Date:   02/21/2019    Order Specific Question:   If indicated for the ordered procedure, I authorize the administration of contrast media per Radiology protocol    Answer:   Yes    Order Specific Question:   Preferred imaging location?    Answer:   [facility]    Order Specific Question:   Is Oral Contrast requested for this exam?    Answer:   Yes, Per Radiology protocol    Order Specific Question:   Radiology Contrast Protocol - do NOT remove file path    Answer:   \\[email or web address]  . CT Chest W Contrast    Standing Status:   Future    Standing Expiration Date:   02/21/2019    Order Specific Question:   If  indicated for the ordered procedure, I authorize the administration of contrast media per Radiology protocol    Answer:   Yes    Order Specific Question:   Preferred imaging location?    Answer:   [facility]    Order Specific Question:   Radiology Contrast Protocol - do NOT remove file path    Answer:   \\[email or web address]    All questions were answered.   The patient knows to call the clinic with any problems, questions or concerns.  I spent 25 minutes counseling the patient face to face. The total time spent in the appointment was 30 minutes and more than 50% was on counseling.  Dierdre Searles Dweik am acting as scribe for Dr. Truitt Merle.  I have reviewed the above documentation for accuracy and completeness, and I agree with the above.    Truitt Merle  02/21/2018

## 2018-02-21 ENCOUNTER — Other Ambulatory Visit: Payer: BLUE CROSS/BLUE SHIELD

## 2018-02-21 ENCOUNTER — Inpatient Hospital Stay (HOSPITAL_BASED_OUTPATIENT_CLINIC_OR_DEPARTMENT_OTHER): Payer: BLUE CROSS/BLUE SHIELD | Admitting: Hematology

## 2018-02-21 ENCOUNTER — Inpatient Hospital Stay: Payer: BLUE CROSS/BLUE SHIELD

## 2018-02-21 ENCOUNTER — Ambulatory Visit: Payer: BLUE CROSS/BLUE SHIELD | Admitting: Hematology

## 2018-02-21 ENCOUNTER — Encounter: Payer: Self-pay | Admitting: Hematology

## 2018-02-21 ENCOUNTER — Inpatient Hospital Stay: Payer: BLUE CROSS/BLUE SHIELD | Attending: Hematology

## 2018-02-21 ENCOUNTER — Telehealth: Payer: Self-pay | Admitting: Hematology

## 2018-02-21 VITALS — BP 129/94 | HR 94 | Temp 98.1°F | Resp 17 | Ht 71.0 in | Wt 206.1 lb

## 2018-02-21 VITALS — BP 142/99

## 2018-02-21 DIAGNOSIS — C182 Malignant neoplasm of ascending colon: Secondary | ICD-10-CM | POA: Diagnosis not present

## 2018-02-21 DIAGNOSIS — C189 Malignant neoplasm of colon, unspecified: Secondary | ICD-10-CM

## 2018-02-21 DIAGNOSIS — D500 Iron deficiency anemia secondary to blood loss (chronic): Secondary | ICD-10-CM

## 2018-02-21 DIAGNOSIS — C7800 Secondary malignant neoplasm of unspecified lung: Secondary | ICD-10-CM | POA: Diagnosis not present

## 2018-02-21 DIAGNOSIS — I10 Essential (primary) hypertension: Secondary | ICD-10-CM | POA: Insufficient documentation

## 2018-02-21 DIAGNOSIS — Z23 Encounter for immunization: Secondary | ICD-10-CM

## 2018-02-21 DIAGNOSIS — I2510 Atherosclerotic heart disease of native coronary artery without angina pectoris: Secondary | ICD-10-CM | POA: Diagnosis not present

## 2018-02-21 DIAGNOSIS — C787 Secondary malignant neoplasm of liver and intrahepatic bile duct: Principal | ICD-10-CM

## 2018-02-21 DIAGNOSIS — G620 Drug-induced polyneuropathy: Secondary | ICD-10-CM

## 2018-02-21 DIAGNOSIS — G629 Polyneuropathy, unspecified: Secondary | ICD-10-CM | POA: Diagnosis not present

## 2018-02-21 DIAGNOSIS — D509 Iron deficiency anemia, unspecified: Secondary | ICD-10-CM | POA: Insufficient documentation

## 2018-02-21 DIAGNOSIS — Z87891 Personal history of nicotine dependence: Secondary | ICD-10-CM | POA: Diagnosis not present

## 2018-02-21 DIAGNOSIS — F419 Anxiety disorder, unspecified: Secondary | ICD-10-CM | POA: Insufficient documentation

## 2018-02-21 DIAGNOSIS — Z79899 Other long term (current) drug therapy: Secondary | ICD-10-CM | POA: Insufficient documentation

## 2018-02-21 DIAGNOSIS — Z95828 Presence of other vascular implants and grafts: Secondary | ICD-10-CM

## 2018-02-21 DIAGNOSIS — Z5111 Encounter for antineoplastic chemotherapy: Secondary | ICD-10-CM | POA: Diagnosis not present

## 2018-02-21 DIAGNOSIS — Z9049 Acquired absence of other specified parts of digestive tract: Secondary | ICD-10-CM | POA: Diagnosis not present

## 2018-02-21 DIAGNOSIS — G4700 Insomnia, unspecified: Secondary | ICD-10-CM | POA: Insufficient documentation

## 2018-02-21 DIAGNOSIS — F329 Major depressive disorder, single episode, unspecified: Secondary | ICD-10-CM | POA: Insufficient documentation

## 2018-02-21 DIAGNOSIS — T451X5A Adverse effect of antineoplastic and immunosuppressive drugs, initial encounter: Secondary | ICD-10-CM

## 2018-02-21 LAB — CBC WITH DIFFERENTIAL/PLATELET
Abs Immature Granulocytes: 0.01 10*3/uL (ref 0.00–0.07)
BASOS PCT: 1 %
Basophils Absolute: 0 10*3/uL (ref 0.0–0.1)
Eosinophils Absolute: 0.1 10*3/uL (ref 0.0–0.5)
Eosinophils Relative: 2 %
HCT: 38.4 % — ABNORMAL LOW (ref 39.0–52.0)
HEMOGLOBIN: 13 g/dL (ref 13.0–17.0)
Immature Granulocytes: 0 %
Lymphocytes Relative: 17 %
Lymphs Abs: 0.7 10*3/uL (ref 0.7–4.0)
MCH: 33.5 pg (ref 26.0–34.0)
MCHC: 33.9 g/dL (ref 30.0–36.0)
MCV: 99 fL (ref 80.0–100.0)
MONO ABS: 0.8 10*3/uL (ref 0.1–1.0)
Monocytes Relative: 18 %
NEUTROS ABS: 2.6 10*3/uL (ref 1.7–7.7)
Neutrophils Relative %: 62 %
PLATELETS: 122 10*3/uL — AB (ref 150–400)
RBC: 3.88 MIL/uL — AB (ref 4.22–5.81)
RDW: 15.4 % (ref 11.5–15.5)
WBC: 4.3 10*3/uL (ref 4.0–10.5)
nRBC: 0 % (ref 0.0–0.2)

## 2018-02-21 LAB — TOTAL PROTEIN, URINE DIPSTICK: PROTEIN: NEGATIVE mg/dL

## 2018-02-21 LAB — COMPREHENSIVE METABOLIC PANEL
ALT: 18 U/L (ref 0–44)
AST: 20 U/L (ref 15–41)
Albumin: 3.7 g/dL (ref 3.5–5.0)
Alkaline Phosphatase: 61 U/L (ref 38–126)
Anion gap: 13 (ref 5–15)
BUN: 10 mg/dL (ref 6–20)
CHLORIDE: 103 mmol/L (ref 98–111)
CO2: 23 mmol/L (ref 22–32)
Calcium: 9.1 mg/dL (ref 8.9–10.3)
Creatinine, Ser: 1.13 mg/dL (ref 0.61–1.24)
GFR calc Af Amer: >60 mL/min (ref >60)
GFR calc non Af Amer: >60 mL/min (ref >60)
Glucose, Bld: 100 mg/dL — ABNORMAL HIGH (ref 70–99)
POTASSIUM: 4.6 mmol/L (ref 3.5–5.1)
Sodium: 139 mmol/L (ref 135–145)
Total Bilirubin: 0.5 mg/dL (ref 0.3–1.2)
Total Protein: 7.1 g/dL (ref 6.5–8.1)

## 2018-02-21 MED ORDER — LEUCOVORIN CALCIUM INJECTION 350 MG
400.0000 mg/m2 | Freq: Once | INTRAVENOUS | Status: AC
  Administered 2018-02-21: 840 mg via INTRAVENOUS
  Filled 2018-02-21: qty 42

## 2018-02-21 MED ORDER — ATROPINE SULFATE 1 MG/ML IJ SOLN: 0.5000 mg | Freq: Once | INTRAMUSCULAR | Status: DC

## 2018-02-21 MED ORDER — SODIUM CHLORIDE 0.9 % IV SOLN
Freq: Once | INTRAVENOUS | Status: AC
  Administered 2018-02-21: 10:00:00 via INTRAVENOUS
  Filled 2018-02-21: qty 250

## 2018-02-21 MED ORDER — METHYLPREDNISOLONE SODIUM SUCC 125 MG IJ SOLR
125.0000 mg | Freq: Once | INTRAMUSCULAR | Status: AC
  Administered 2018-02-21: 125 mg via INTRAVENOUS

## 2018-02-21 MED ORDER — SODIUM CHLORIDE 0.9 % IV SOLN
2200.0000 mg/m2 | INTRAVENOUS | Status: DC
  Administered 2018-02-21: 4600 mg via INTRAVENOUS
  Filled 2018-02-21: qty 92

## 2018-02-21 MED ORDER — SODIUM CHLORIDE 0.9% FLUSH
10.0000 mL | INTRAVENOUS | Status: DC | PRN
  Filled 2018-02-21: qty 10

## 2018-02-21 MED ORDER — DEXTROSE 5 % IV SOLN
Freq: Once | INTRAVENOUS | Status: AC
  Administered 2018-02-21: 11:00:00 via INTRAVENOUS
  Filled 2018-02-21: qty 250

## 2018-02-21 MED ORDER — METHYLPREDNISOLONE SODIUM SUCC 125 MG IJ SOLR
INTRAMUSCULAR | Status: AC
  Filled 2018-02-21: qty 2

## 2018-02-21 MED ORDER — OXALIPLATIN CHEMO INJECTION 100 MG/20ML
48.0000 mg/m2 | Freq: Once | INTRAVENOUS | Status: AC
  Administered 2018-02-21: 100 mg via INTRAVENOUS
  Filled 2018-02-21: qty 20

## 2018-02-21 MED ORDER — PALONOSETRON HCL INJECTION 0.25 MG/5ML
INTRAVENOUS | Status: AC
  Filled 2018-02-21: qty 5

## 2018-02-21 MED ORDER — SODIUM CHLORIDE 0.9 % IV SOLN
Freq: Once | INTRAVENOUS | Status: AC
  Administered 2018-02-21: 10:00:00 via INTRAVENOUS
  Filled 2018-02-21: qty 5

## 2018-02-21 MED ORDER — INFLUENZA VAC SPLIT QUAD 0.5 ML IM SUSY: 0.5000 mL | PREFILLED_SYRINGE | Freq: Once | INTRAMUSCULAR | Status: DC

## 2018-02-21 MED ORDER — CELECOXIB 200 MG PO CAPS: 200.0000 mg | ORAL_CAPSULE | Freq: Every day | ORAL | 1 refills | Status: DC

## 2018-02-21 MED ORDER — OMEPRAZOLE 20 MG PO CPDR: 20.0000 mg | DELAYED_RELEASE_CAPSULE | Freq: Two times a day (BID) | ORAL | 2 refills | Status: DC

## 2018-02-21 MED ORDER — SODIUM CHLORIDE 0.9 % IV SOLN
5.0000 mg/kg | Freq: Once | INTRAVENOUS | Status: AC
  Administered 2018-02-21: 450 mg via INTRAVENOUS
  Filled 2018-02-21: qty 4

## 2018-02-21 MED ORDER — PALONOSETRON HCL INJECTION 0.25 MG/5ML
0.2500 mg | Freq: Once | INTRAVENOUS | Status: AC
  Administered 2018-02-21: 0.25 mg via INTRAVENOUS

## 2018-02-21 MED ORDER — HEPARIN SOD (PORK) LOCK FLUSH 100 UNIT/ML IV SOLN
500.0000 [IU] | Freq: Once | INTRAVENOUS | Status: DC | PRN
  Filled 2018-02-21: qty 5

## 2018-02-21 MED ORDER — SODIUM CHLORIDE 0.9% FLUSH
10.0000 mL | Freq: Once | INTRAVENOUS | Status: AC
  Administered 2018-02-21: 10 mL
  Filled 2018-02-21: qty 10

## 2018-02-21 NOTE — Patient Instructions (Addendum)
[facility] Discharge Instructions for Patients Receiving Chemotherapy  Today you received the following chemotherapy agents: Bevacizumab (Avastin), Oxaliplatin (Eloxatin), Leucovorin, and Fluorouracil (Adrucil)  To help prevent nausea and vomiting after your treatment, we encourage you to take your nausea medication as directed. Received Aloxi during treatment today-->Take Compazine or Ativan (not Zofran) for the next 3 days as needed.   If you develop nausea and vomiting that is not controlled by your nausea medication, call the clinic.   BELOW ARE SYMPTOMS THAT SHOULD BE REPORTED IMMEDIATELY:  *FEVER GREATER THAN 100.5 F  *CHILLS WITH OR WITHOUT FEVER  NAUSEA AND VOMITING THAT IS NOT CONTROLLED WITH YOUR NAUSEA MEDICATION  *UNUSUAL SHORTNESS OF BREATH  *UNUSUAL BRUISING OR BLEEDING  TENDERNESS IN MOUTH AND THROAT WITH OR WITHOUT PRESENCE OF ULCERS  *URINARY PROBLEMS  *BOWEL PROBLEMS  UNUSUAL RASH Items with * indicate a potential emergency and should be followed up as soon as possible.  Feel free to call the clinic should you have any questions or concerns. The clinic phone number is [phone].  Please show the [facility] at check-in to the Emergency Department and triage nurse.  [name] Discharge Instructions for Patients receiving Home Portable Chemo Pump    **The bag should finish at 46 hours, 96 hours or 7 days. For example, if your pump is scheduled for 46 hours and it was put on at 4pm, it should finish at 2 pm the day it is scheduled to come off regardless of your appointment time.    Estimated time to finish   _________________________ (Have your nurse fill in)     ** if the display on your pump reads "Low Volume" and it is beeping, take the batteries out of the pump and come to the cancer center for it to be taken off.   **If the pump alarms go off prior to the pump reading "Low Volume" then call the [phone]  and someone can assist you.  **If the plunger comes out and the bag fluid is running out, please use your chemo spill kit to clean up the spill. Do not use paper towels or other house hold products.  ** If you have problems or questions regarding your pump, please call either the [phone] or the cancer center Monday-Friday 8:00am-4:30pm at [phone] and we will assist you.  If you are unable to get assistance then go to [facility] Emergency Room, ask the staff to contact the IV team for assistance.

## 2018-02-21 NOTE — Telephone Encounter (Signed)
No los 10/10 °

## 2018-02-22 NOTE — Progress Notes (Signed)
FMLA for spouse, Sue Mcalexander, mailed to patient address on file. No fax number provided to fax forms to.

## 2018-02-23 ENCOUNTER — Inpatient Hospital Stay: Payer: BLUE CROSS/BLUE SHIELD

## 2018-02-23 VITALS — BP 119/86 | HR 87 | Temp 97.8°F | Resp 16

## 2018-02-23 DIAGNOSIS — C189 Malignant neoplasm of colon, unspecified: Secondary | ICD-10-CM

## 2018-02-23 DIAGNOSIS — C182 Malignant neoplasm of ascending colon: Secondary | ICD-10-CM | POA: Diagnosis not present

## 2018-02-23 DIAGNOSIS — Z95828 Presence of other vascular implants and grafts: Secondary | ICD-10-CM

## 2018-02-23 DIAGNOSIS — C787 Secondary malignant neoplasm of liver and intrahepatic bile duct: Principal | ICD-10-CM

## 2018-02-23 MED ORDER — HEPARIN SOD (PORK) LOCK FLUSH 100 UNIT/ML IV SOLN
500.0000 [IU] | Freq: Once | INTRAVENOUS | Status: DC | PRN
  Filled 2018-02-23: qty 5

## 2018-02-23 MED ORDER — SODIUM CHLORIDE 0.9% FLUSH
10.0000 mL | INTRAVENOUS | Status: DC | PRN
  Administered 2018-02-23: 10 mL
  Filled 2018-02-23: qty 10

## 2018-02-23 MED ORDER — SODIUM CHLORIDE 0.9% FLUSH
10.0000 mL | Freq: Once | INTRAVENOUS | Status: DC
  Filled 2018-02-23: qty 10

## 2018-02-23 MED ORDER — HEPARIN SOD (PORK) LOCK FLUSH 100 UNIT/ML IV SOLN
500.0000 [IU] | Freq: Once | INTRAVENOUS | Status: AC
  Administered 2018-02-23: 500 [IU]
  Filled 2018-02-23: qty 5

## 2018-02-23 NOTE — Patient Instructions (Signed)
Implanted Port Home Guide An implanted port is a type of central line that is placed under the skin. Central lines are used to provide IV access when treatment or nutrition needs to be given through a person's veins. Implanted ports are used for long-term IV access. An implanted port may be placed because:  You need IV medicine that would be irritating to the small veins in your hands or arms.  You need long-term IV medicines, such as antibiotics.  You need IV nutrition for a long period.  You need frequent blood draws for lab tests.  You need dialysis.  Implanted ports are usually placed in the chest area, but they can also be placed in the upper arm, the abdomen, or the leg. An implanted port has two main parts:  Reservoir. The reservoir is round and will appear as a small, raised area under your skin. The reservoir is the part where a needle is inserted to give medicines or draw blood.  Catheter. The catheter is a thin, flexible tube that extends from the reservoir. The catheter is placed into a large vein. Medicine that is inserted into the reservoir goes into the catheter and then into the vein.  How will I care for my incision site? Do not get the incision site wet. Bathe or shower as directed by your health care provider. How is my port accessed? Special steps must be taken to access the port:  Before the port is accessed, a numbing cream can be placed on the skin. This helps numb the skin over the port site.  Your health care provider uses a sterile technique to access the port. ? Your health care provider must put on a mask and sterile gloves. ? The skin over your port is cleaned carefully with an antiseptic and allowed to dry. ? The port is gently pinched between sterile gloves, and a needle is inserted into the port.  Only "non-coring" port needles should be used to access the port. Once the port is accessed, a blood return should be checked. This helps ensure that the port  is in the vein and is not clogged.  If your port needs to remain accessed for a constant infusion, a clear (transparent) bandage will be placed over the needle site. The bandage and needle will need to be changed every week, or as directed by your health care provider.  Keep the bandage covering the needle clean and dry. Do not get it wet. Follow your health care provider's instructions on how to take a shower or bath while the port is accessed.  If your port does not need to stay accessed, no bandage is needed over the port.  What is flushing? Flushing helps keep the port from getting clogged. Follow your health care provider's instructions on how and when to flush the port. Ports are usually flushed with saline solution or a medicine called heparin. The need for flushing will depend on how the port is used.  If the port is used for intermittent medicines or blood draws, the port will need to be flushed: ? After medicines have been given. ? After blood has been drawn. ? As part of routine maintenance.  If a constant infusion is running, the port may not need to be flushed.  How long will my port stay implanted? The port can stay in for as long as your health care provider thinks it is needed. When it is time for the port to come out, surgery will be   done to remove it. The procedure is similar to the one performed when the port was put in. When should I seek immediate medical care? When you have an implanted port, you should seek immediate medical care if:  You notice a bad smell coming from the incision site.  You have swelling, redness, or drainage at the incision site.  You have more swelling or pain at the port site or the surrounding area.  You have a fever that is not controlled with medicine.  This information is not intended to replace advice given to you by your health care provider. Make sure you discuss any questions you have with your health care provider. Document  Released: 05/01/2005 Document Revised: 10/07/2015 Document Reviewed: 01/06/2013 Elsevier Interactive Patient Education  2017 [employer]  

## 2018-02-26 NOTE — Progress Notes (Signed)
PA for Omeprazole has been submitted.

## 2018-03-07 ENCOUNTER — Inpatient Hospital Stay: Payer: BLUE CROSS/BLUE SHIELD

## 2018-03-07 ENCOUNTER — Inpatient Hospital Stay (HOSPITAL_BASED_OUTPATIENT_CLINIC_OR_DEPARTMENT_OTHER): Payer: BLUE CROSS/BLUE SHIELD | Admitting: Nurse Practitioner

## 2018-03-07 ENCOUNTER — Encounter: Payer: Self-pay | Admitting: Nurse Practitioner

## 2018-03-07 VITALS — BP 136/99 | HR 88 | Temp 98.8°F | Resp 18 | Ht 70.0 in | Wt 210.8 lb

## 2018-03-07 DIAGNOSIS — C189 Malignant neoplasm of colon, unspecified: Secondary | ICD-10-CM

## 2018-03-07 DIAGNOSIS — C182 Malignant neoplasm of ascending colon: Secondary | ICD-10-CM

## 2018-03-07 DIAGNOSIS — C787 Secondary malignant neoplasm of liver and intrahepatic bile duct: Secondary | ICD-10-CM | POA: Diagnosis not present

## 2018-03-07 DIAGNOSIS — C7800 Secondary malignant neoplasm of unspecified lung: Secondary | ICD-10-CM

## 2018-03-07 DIAGNOSIS — D500 Iron deficiency anemia secondary to blood loss (chronic): Secondary | ICD-10-CM

## 2018-03-07 DIAGNOSIS — Z79899 Other long term (current) drug therapy: Secondary | ICD-10-CM

## 2018-03-07 DIAGNOSIS — Z95828 Presence of other vascular implants and grafts: Secondary | ICD-10-CM

## 2018-03-07 DIAGNOSIS — D509 Iron deficiency anemia, unspecified: Secondary | ICD-10-CM

## 2018-03-07 DIAGNOSIS — Z23 Encounter for immunization: Secondary | ICD-10-CM

## 2018-03-07 DIAGNOSIS — G629 Polyneuropathy, unspecified: Secondary | ICD-10-CM

## 2018-03-07 DIAGNOSIS — T451X5A Adverse effect of antineoplastic and immunosuppressive drugs, initial encounter: Secondary | ICD-10-CM

## 2018-03-07 DIAGNOSIS — G620 Drug-induced polyneuropathy: Secondary | ICD-10-CM

## 2018-03-07 LAB — COMPREHENSIVE METABOLIC PANEL
ALBUMIN: 3.6 g/dL (ref 3.5–5.0)
ALT: 22 U/L (ref 0–44)
AST: 22 U/L (ref 15–41)
Alkaline Phosphatase: 54 U/L (ref 38–126)
Anion gap: 13 (ref 5–15)
BILIRUBIN TOTAL: 0.4 mg/dL (ref 0.3–1.2)
BUN: 12 mg/dL (ref 6–20)
CHLORIDE: 105 mmol/L (ref 98–111)
CO2: 21 mmol/L — ABNORMAL LOW (ref 22–32)
CREATININE: 1.08 mg/dL (ref 0.61–1.24)
Calcium: 9 mg/dL (ref 8.9–10.3)
GFR calc Af Amer: >60 mL/min (ref >60)
GLUCOSE: 130 mg/dL — AB (ref 70–99)
POTASSIUM: 4.1 mmol/L (ref 3.5–5.1)
Sodium: 139 mmol/L (ref 135–145)
Total Protein: 6.8 g/dL (ref 6.5–8.1)

## 2018-03-07 LAB — CBC WITH DIFFERENTIAL/PLATELET
ABS IMMATURE GRANULOCYTES: 0.01 10*3/uL (ref 0.00–0.07)
BASOS ABS: 0 10*3/uL (ref 0.0–0.1)
Basophils Relative: 0 %
EOS ABS: 0.1 10*3/uL (ref 0.0–0.5)
Eosinophils Relative: 2 %
HEMATOCRIT: 37.6 % — AB (ref 39.0–52.0)
Hemoglobin: 12.6 g/dL — ABNORMAL LOW (ref 13.0–17.0)
Immature Granulocytes: 0 %
LYMPHS ABS: 0.5 10*3/uL — AB (ref 0.7–4.0)
Lymphocytes Relative: 16 %
MCH: 33.8 pg (ref 26.0–34.0)
MCHC: 33.5 g/dL (ref 30.0–36.0)
MCV: 100.8 fL — AB (ref 80.0–100.0)
MONOS PCT: 14 %
Monocytes Absolute: 0.5 10*3/uL (ref 0.1–1.0)
NEUTROS ABS: 2.3 10*3/uL (ref 1.7–7.7)
NEUTROS PCT: 68 %
NRBC: 0 % (ref 0.0–0.2)
PLATELETS: 100 10*3/uL — AB (ref 150–400)
RBC: 3.73 MIL/uL — ABNORMAL LOW (ref 4.22–5.81)
RDW: 15.1 % (ref 11.5–15.5)
WBC: 3.4 10*3/uL — AB (ref 4.0–10.5)

## 2018-03-07 LAB — IRON AND TIBC
Iron: 90 ug/dL (ref 42–163)
SATURATION RATIOS: 25 % — AB (ref 42–163)
TIBC: 361 ug/dL (ref 202–409)
UIBC: 271 ug/dL

## 2018-03-07 LAB — FERRITIN: Ferritin: 174 ng/mL (ref 24–336)

## 2018-03-07 LAB — TOTAL PROTEIN, URINE DIPSTICK: Protein, ur: NEGATIVE mg/dL

## 2018-03-07 LAB — CEA (IN HOUSE-CHCC): CEA (CHCC-IN HOUSE): 11.53 ng/mL — AB (ref 0.00–5.00)

## 2018-03-07 MED ORDER — SODIUM CHLORIDE 0.9% FLUSH
10.0000 mL | INTRAVENOUS | Status: DC | PRN
  Filled 2018-03-07: qty 10

## 2018-03-07 MED ORDER — PALONOSETRON HCL INJECTION 0.25 MG/5ML
INTRAVENOUS | Status: AC
  Filled 2018-03-07: qty 5

## 2018-03-07 MED ORDER — LEUCOVORIN CALCIUM INJECTION 350 MG
400.0000 mg/m2 | Freq: Once | INTRAVENOUS | Status: AC
  Administered 2018-03-07: 840 mg via INTRAVENOUS
  Filled 2018-03-07: qty 42

## 2018-03-07 MED ORDER — INFLUENZA VAC SPLIT QUAD 0.5 ML IM SUSY
PREFILLED_SYRINGE | INTRAMUSCULAR | Status: AC
  Filled 2018-03-07: qty 0.5

## 2018-03-07 MED ORDER — SODIUM CHLORIDE 0.9 % IV SOLN
2200.0000 mg/m2 | INTRAVENOUS | Status: DC
  Administered 2018-03-07: 4600 mg via INTRAVENOUS
  Filled 2018-03-07: qty 92

## 2018-03-07 MED ORDER — ATROPINE SULFATE 1 MG/ML IJ SOLN: 0.5000 mg | Freq: Once | INTRAMUSCULAR | Status: DC

## 2018-03-07 MED ORDER — SODIUM CHLORIDE 0.9 % IV SOLN
Freq: Once | INTRAVENOUS | Status: AC
  Administered 2018-03-07: 12:00:00 via INTRAVENOUS
  Filled 2018-03-07: qty 5

## 2018-03-07 MED ORDER — OXALIPLATIN CHEMO INJECTION 100 MG/20ML
48.0000 mg/m2 | Freq: Once | INTRAVENOUS | Status: AC
  Administered 2018-03-07: 100 mg via INTRAVENOUS
  Filled 2018-03-07: qty 20

## 2018-03-07 MED ORDER — METHYLPREDNISOLONE SODIUM SUCC 125 MG IJ SOLR
INTRAMUSCULAR | Status: AC
  Filled 2018-03-07: qty 2

## 2018-03-07 MED ORDER — DEXTROSE 5 % IV SOLN
Freq: Once | INTRAVENOUS | Status: AC
  Administered 2018-03-07: 14:00:00 via INTRAVENOUS
  Filled 2018-03-07: qty 250

## 2018-03-07 MED ORDER — METHYLPREDNISOLONE SODIUM SUCC 125 MG IJ SOLR
125.0000 mg | Freq: Once | INTRAMUSCULAR | Status: AC
  Administered 2018-03-07: 125 mg via INTRAVENOUS

## 2018-03-07 MED ORDER — SODIUM CHLORIDE 0.9 % IV SOLN
5.0000 mg/kg | Freq: Once | INTRAVENOUS | Status: AC
  Administered 2018-03-07: 450 mg via INTRAVENOUS
  Filled 2018-03-07: qty 16

## 2018-03-07 MED ORDER — HEPARIN SOD (PORK) LOCK FLUSH 100 UNIT/ML IV SOLN
500.0000 [IU] | Freq: Once | INTRAVENOUS | Status: DC | PRN
  Filled 2018-03-07: qty 5

## 2018-03-07 MED ORDER — SODIUM CHLORIDE 0.9 % IV SOLN
Freq: Once | INTRAVENOUS | Status: AC
  Administered 2018-03-07: 12:00:00 via INTRAVENOUS
  Filled 2018-03-07: qty 250

## 2018-03-07 MED ORDER — SODIUM CHLORIDE 0.9% FLUSH
10.0000 mL | Freq: Once | INTRAVENOUS | Status: AC
  Administered 2018-03-07: 10 mL
  Filled 2018-03-07: qty 10

## 2018-03-07 MED ORDER — INFLUENZA VAC SPLIT QUAD 0.5 ML IM SUSY
0.5000 mL | PREFILLED_SYRINGE | Freq: Once | INTRAMUSCULAR | Status: AC
  Administered 2018-03-07: 0.5 mL via INTRAMUSCULAR

## 2018-03-07 MED ORDER — PALONOSETRON HCL INJECTION 0.25 MG/5ML
0.2500 mg | Freq: Once | INTRAVENOUS | Status: AC
  Administered 2018-03-07: 0.25 mg via INTRAVENOUS

## 2018-03-07 NOTE — Patient Instructions (Signed)
[facility] Discharge Instructions for Patients Receiving Chemotherapy  Today you received the following chemotherapy agents: Avastin, Oxaliplatin, Leucovorin, and 5-FU  To help prevent nausea and vomiting after your treatment, we encourage you to take your nausea medication as directed.    If you develop nausea and vomiting that is not controlled by your nausea medication, call the clinic.   BELOW ARE SYMPTOMS THAT SHOULD BE REPORTED IMMEDIATELY:  *FEVER GREATER THAN 100.5 F  *CHILLS WITH OR WITHOUT FEVER  NAUSEA AND VOMITING THAT IS NOT CONTROLLED WITH YOUR NAUSEA MEDICATION  *UNUSUAL SHORTNESS OF BREATH  *UNUSUAL BRUISING OR BLEEDING  TENDERNESS IN MOUTH AND THROAT WITH OR WITHOUT PRESENCE OF ULCERS  *URINARY PROBLEMS  *BOWEL PROBLEMS  UNUSUAL RASH Items with * indicate a potential emergency and should be followed up as soon as possible.  Feel free to call the clinic should you have any questions or concerns. The clinic phone number is [phone].  Please show the CHEMO ALERT CARD at check-in to the Emergency Department and triage nurse.   

## 2018-03-07 NOTE — Progress Notes (Addendum)
Nicholas Lowery  [phone] [phone]  Clinic Follow up Note   Patient Care Team: Curlene Labrum, MD as PCP - General (Family Medicine) 03/07/2018  SUMMARY OF ONCOLOGIC HISTORY: Oncology History   Cancer Staging Metastatic colon cancer to liver [facility]) Staging form: Colon and Rectum, AJCC 8th Edition - Clinical stage from 06/01/2016: Stage IVA (cTX, cNX, pM1a) - Signed by Truitt Merle, MD on 07/04/2016 - Pathologic stage from 06/14/2016: Stage IVA (pT4b(m), pN2b, pM1a) - Signed by Truitt Merle, MD on 07/04/2016       Metastatic colon cancer to liver ([place])   04/2015 Procedure    Colonoscopy by Dr. Ladona Horns. It showed showed 2 sessile polyps ready between 3-5 mm in size located 20 cm (A, B) from the point of entry, polypectomy was performed. Pedunculated polyp was found in the ascending colon (C), polypectomy was performed, and additional polyp (D) was found 30 cm from the point of entry, removed    04/2015 Pathology Results    tubular adenoma (A and B), and well differentiated adenocarcinoma arising from tubulovillous adenoma (C) and well differentiated adenocarcinoma arising from severe dysplasia to intramucosal carcinoma within tubular adenoma.     04/2015 Initial Diagnosis    Metastatic colon cancer to liver ([place])    05/29/2016 Imaging    CT abdomen and pelvis with contrast showed an apple core like stricture in right colon just above the cecum, measuring 3.2 cm in lengths. This is highly suspicious for malignancy. Small lymph node a noticed he had adjacent mesentery, measuring 8 mm. There is a low-density lesion within the inferior aspect of the right hepatic lobe measuring 1.7 cm, suspicious for metastasis.     06/06/2016 Tumor Marker    CEA 9.99    06/08/2016 PET scan    IMPRESSION: Approximately 3 cm hypermetabolic mass in the ascending colon, consistent with primary colon carcinoma. This mass results in colonic obstruction and small bowel dilatation.  Additional areas of hypermetabolic wall thickening in the cecum may represent other sites of colon carcinoma or colitis. Mild hypermetabolic lymphadenopathy in right pericolonic region, porta hepatis, and aortocaval space, consistent with metastatic disease. Mild hypermetabolic mediastinal lymphadenopathy also seen, and thoracic lymph node metastases cannot be excluded. Solitary hypermetabolic focus in inferior right hepatic lobe, consistent with liver metastasis. Consider abdomen MRI without and with contrast for further evaluation.    06/14/2016 Surgery    Hand assisted right hemicolectomy and small bowel resection for colon cancer, liver biopsy, by Dr. Barry Dienes    06/14/2016 Pathology Results    Right hemicolectomy showed invasive well to moderately differentiated adenocarcinoma, 2 foci measuring 7.5 cm and 4.5 cm, tumor invades through full thickness of colon, to the seroma and involve the Small Bowel, Surgical Margins Were Negative, 24 Out Of 64 Lymph Nodes Were Positive, Extracapsular Extension Identified, Multiple Satellite Tumor Deposits Present, Liver Biopsy Showed Metastatic Adenocarcinoma.      06/14/2016 Miscellaneous    Tumor MMR normal, MSI stable     06/14/2016 Miscellaneous    Foundation one genomic testing showed K-ras G12 D mutation, APC and TP53 mutation. No BRAF and NRAS mutation. MSI-stable, tumor burden low.    07/06/2016 Tumor Marker    CEA 13.69    07/13/2016 - 01/18/2017 Chemotherapy    mFOLFOX, every 2 weeks, started on 07/14/2015, Avastin added from cycle 3  Oxaliplatin dose to '60mg'$ /m2 due to side effects and some cytopenia on 09/21/16  Changed to FOLFIRINOX starting cycle 7 and Reduced  Due to  neuropathy hold Oxaliplatin and add Irinotecan with neulasta on day 3 starting with cycle 7 Add low dose Oxaliplatin with cycle 8.  Due to his worsening neuropathy, and good response to chemotherapy, I previously stopped oxaliplatin from cycle 11, and continue FOLFIRI and avastin        07/27/2016 Tumor Marker    CEA 15.85    09/04/2016 Imaging    Ct C/A/P W Contrast IMPRESSION: Interval right colectomy. Stable small liver metastasis in the inferior right hepatic lobe. Stable mild porta hepatis and aortocaval lymphadenopathy. Stable mild mediastinal lymphadenopathy. No new or progressive metastatic disease identified within the chest, abdomen, or pelvis.    12/19/2016 PET scan    IMPRESSION: 1. Right hemicolectomy, with resolution of the prior hypermetabolic activity inferiorly in the right hepatic lobe, in several mediastinal lymph nodes, and in lymph nodes in the retroperitoneum and porta hepatis. No residual hypermetabolic or enlarged lymph nodes are identified. 2. Low-grade diffuse skeletal metabolic activity is likely therapy related. 3. Coronary atherosclerosis. 4. 3 by 4 mm right middle lobe pulmonary nodule is stable, not appreciably hypermetabolic, but below sensitive PET-CT size thresholds. This may warrant surveillance.    01/11/2017 Imaging    MR Abdomen W WO Contrast IMPRESSION: 1. No acute findings within the abdomen. Previously noted liver metastasis has resolved in the interval. No new lesions.    02/01/2017 - 09/2017 Chemotherapy    Maintenance therapy, Xeloda '2000mg'$  (1000 mg/m2)  q12h on day 1-14 every 21 days plus AVASTIN, starting 02/01/2017.  stopped after 12 days due to poor tolerance on 02/14/17  Changed to maintenance 5-FU and avastin every 2 weeks starting on 02/22/17, stopped in 09/2017.       04/10/2017 Imaging    IMPRESSION: 1. Status post right hemicolectomy without findings for recurrent tumor. 2. No worrisome hepatic lesions. Treated disease with only a small residual low attenuation lesion in the right hepatic lobe. 3. No recurrent mediastinal or abdominal lymphadenopathy.     07/09/2017 PET scan    PET 07/09/17 IMPRESSION: 1. Status post right hemicolectomy, without findings of hypermetabolic recurrent or metastatic  disease. 2. A right middle lobe pulmonary nodule is unchanged. However, there is a right lower lobe 5 mm pulmonary nodule which is felt to be new and enlarged compared to prior exams. Suspicious for an isolated pulmonary metastasis. Consider CT follow-up at 3-6 months. 3. Age advanced coronary artery atherosclerosis. Recommend assessment of coronary risk factors and consideration of medical therapy. 4. Borderline ascending aortic dilatation, 4.0 cm      10/10/2017 Imaging    IMPRESSION: 1. Several (at least 10) subcentimeter pulmonary nodules scattered in both lungs, predominantly in the lower lobes, all new/increased, most compatible with enlarging pulmonary metastases, largest 8 mm in the right lower lobe. 2. No additional findings of new or progressive metastatic disease. No recurrent adenopathy. Stable small low-attenuation lesion in the inferior right liver lobe compatible with treated metastasis. No new liver metastases. 3. Stable ectatic 4.0 cm ascending thoracic aorta. Recommend annual imaging followup by CTA or MRA.  4. Stable mild splenomegaly.     10/11/2017 - 12/2017 Chemotherapy    FOLFIRI and Avastin every 2 weeks starting 10/11/17, irnotecan stopped in 12/2017 due to disease progression      01/08/2018 Imaging    01/08/2018 CT CAP IMPRESSION: 1. Progressive hepatic and pulmonary metastatic disease. 2. Ascending Aortic aneurysm NOS (ICD10-I71.9).    01/08/2018 Progression    01/08/2018 CT CAP IMPRESSION: 1. Progressive hepatic and pulmonary metastatic disease. 2.  Ascending Aortic aneurysm NOS (ICD10-I71.9).    01/10/2018 -  Chemotherapy    FOLFOX and Avastin every 2 weeks starting on 01/10/2018.    CURRENT THERAPY:  Second line FOLFOX and Avastin started on 01/10/2018   INTERVAL HISTORY: Nicholas Lowery returns for follow up as scheduled. He completed last cycle FOLFOX with dose-reduced oxaliplatin for neuropathy on 10/10. Neuropathy in his feet is more noticeable, not  painful or sensitive, not affecting balance or gait. Less in fingertips, not limiting function. Not dropping things. Cold sensitivity lasts 5-6 days. He has slightly more fatigue. Appetite is good, continues to eat spicy foods and drink alcohol. BMs normal. He wakes up with mild nausea, no vomiting, rarely takes anti-emetics. On prilosec but not sure it is helping. Mood is stable, he has lowered his expectations in general to try to avoid being disappointed, especially regarding his cancer trajectory. He continues to see Dr. Cheryln Manly. He saw dermatologist who biopsied small area in left axilla, patient reports it was a benign cyst. The area is still tender. Denies mucositis, fever, chills, cough, chest pain, dyspnea.    MEDICAL HISTORY:  Past Medical History:  Diagnosis Date  . Anxiety   . Cancer of ascending colon ([place])   . Seasonal allergies     SURGICAL HISTORY: Past Surgical History:  Procedure Laterality Date  . APPENDECTOMY  1992  . CATARACT EXTRACTION W/ INTRAOCULAR LENS IMPLANT Left 04/2016  . COLON SURGERY    . COLONOSCOPY W/ BIOPSIES AND POLYPECTOMY  04/2015  . INGUINAL HERNIA REPAIR Right 1982  . IR RADIOLOGIST EVAL & MGMT  01/16/2017  . KNEE ARTHROSCOPY W/ MENISCECTOMY Right 1998  . LAPAROSCOPIC RIGHT COLECTOMY N/A 06/14/2016   Procedure: LAPAROSCOPIC HAND ASSISTED HEMICOLECTOMY AND SMALL BOWEL RESECTION.;  Surgeon: [name], MD;  Location: [place];  Service: General;  Laterality: N/A;  . LIVER BIOPSY Right 06/14/2016   Procedure: LIVER BIOPSY;  Surgeon: [name], MD;  Location: [facility];  Service: General;  Laterality: Right;  Right Inferior Liver  . PORTACATH PLACEMENT N/A 07/06/2016   Procedure: INSERTION PORT-A-CATH;  Surgeon: [name], MD;  Location: [place];  Service: General;  Laterality: N/A;  . VASECTOMY  2005    I have reviewed the social history and family history with the patient and they are unchanged from previous note.  ALLERGIES:  is  allergic to penicillins and xeloda [capecitabine].  MEDICATIONS:  Current Outpatient Medications  Medication Sig Dispense Refill  . amLODipine (NORVASC) 10 MG tablet Take 1 tablet (10 mg total) by mouth daily. 30 tablet 2  . celecoxib (CELEBREX) 200 MG capsule Take 1 capsule (200 mg total) by mouth daily. 60 capsule 1  . dexamethasone (DECADRON) 4 MG tablet UP TO THREE DAYS AFTER CHEMO (BEGIN DAY TWO) 15 tablet 0  . diphenoxylate-atropine (LOMOTIL) 2.5-0.025 MG tablet Take 2 tablets by mouth 4 (four) times daily as needed for diarrhea or loose stools. 30 tablet 0  . lidocaine-prilocaine (EMLA) cream Apply to affected area once 30 g 3  . lisinopril (PRINIVIL,ZESTRIL) 10 MG tablet Take 2 tablets (20 mg total) by mouth daily. 180 tablet 2  . LORazepam (ATIVAN) 0.5 MG tablet Take 1 tablet (0.5 mg total) by mouth every 8 (eight) hours as needed (for nausea). 30 tablet 0  . Multiple Vitamin (MULTIVITAMIN WITH MINERALS) TABS tablet Take 1 tablet by mouth daily.    Marland Kitchen omeprazole (PRILOSEC) 20 MG capsule Take 1 capsule (20 mg total) by mouth 2 (two) times daily before  a meal. 60 capsule 2  . ondansetron (ZOFRAN ODT) 8 MG disintegrating tablet Take 1 tablet (8 mg total) by mouth every 8 (eight) hours as needed for nausea or vomiting. 30 tablet 2  . prochlorperazine (COMPAZINE) 10 MG tablet Take 1 tablet (10 mg total) by mouth every 6 (six) hours as needed for nausea or vomiting. 30 tablet 3  . traMADol (ULTRAM) 50 MG tablet Take 1 tablet (50 mg total) by mouth every 6 (six) hours as needed. 30 tablet 0  . urea (CARMOL) 10 % cream Apply topically 3 (three) times daily. 71 g 1  . venlafaxine XR (EFFEXOR XR) 150 MG 24 hr capsule Take 1 capsule (150 mg total) by mouth daily with breakfast. 90 capsule 1   No current facility-administered medications for this visit.    Facility-Administered Medications Ordered in Other Visits  Medication Dose Route Frequency Provider Last Rate Last Dose  . atropine injection  0.5 mg  0.5 mg Intravenous Once Truitt Merle, MD      . bevacizumab (AVASTIN) 450 mg in sodium chloride 0.9 % 100 mL chemo infusion  5 mg/kg (Treatment Plan Recorded) Intravenous Once Truitt Merle, MD 708 mL/hr at 03/07/18 1243 450 mg at 03/07/18 1243  . clindamycin (CLEOCIN) 900 mg in dextrose 5 % 50 mL IVPB  900 mg Intravenous 60 min Pre-Op [name], MD       And  . gentamicin (GARAMYCIN) 450 mg in dextrose 5 % 50 mL IVPB  5 mg/kg Intravenous 60 min Pre-Op [name], MD      . dexamethasone (DECADRON) injection 10 mg  10 mg Intravenous Once Harle Stanford., PA-C      . dextrose 5 % solution   Intravenous Once Truitt Merle, MD      . diphenoxylate-atropine (LOMOTIL) 2.5-0.025 MG per tablet 2 tablet  2 tablet Oral Once Harle Stanford., PA-C      . fluorouracil (ADRUCIL) 4,600 mg in sodium chloride 0.9 % 58 mL chemo infusion  2,200 mg/m2 (Treatment Plan Recorded) Intravenous 1 day or 1 dose Truitt Merle, MD      . heparin lock flush 100 unit/mL  500 Units Intracatheter Once PRN Truitt Merle, MD      . heparin lock flush 100 unit/mL  500 Units Intracatheter Once PRN Truitt Merle, MD      . leucovorin 840 mg in dextrose 5 % 250 mL infusion  400 mg/m2 (Treatment Plan Recorded) Intravenous Once Truitt Merle, MD      . oxaliplatin (ELOXATIN) 100 mg in dextrose 5 % 500 mL chemo infusion  48 mg/m2 (Treatment Plan Recorded) Intravenous Once Truitt Merle, MD      . sodium chloride flush (NS) 0.9 % injection 10 mL  10 mL Intracatheter PRN Truitt Merle, MD      . sodium chloride flush (NS) 0.9 % injection 10 mL  10 mL Intracatheter Once Truitt Merle, MD      . sodium chloride flush (NS) 0.9 % injection 10 mL  10 mL Intracatheter PRN Truitt Merle, MD        PHYSICAL EXAMINATION: ECOG PERFORMANCE STATUS: 1 - Symptomatic but completely ambulatory  Vitals:   03/07/18 1100  BP: (!) 136/99  Pulse: 88  Resp: 18  Temp: 98.8 F (37.1 C)  SpO2: 100%   Filed Weights   03/07/18 1100  Weight: 210 lb 12.8 oz (95.6 kg)     GENERAL:alert, no distress and comfortable SKIN:  no rashes. Skin with general erythema notably  to face, neck, chest. Tiny round superficial lesion to left axilla with surrounding erythema EYES: sclera clear OROPHARYNX:no thrush or ulcers  LYMPH:  no palpable cervical, supraclavicular, or axillary lymphadenopathy  LUNGS: clear to auscultation with normal breathing effort HEART: regular rate & rhythm, no lower extremity edema ABDOMEN:abdomen soft, non-tender and normal bowel sounds Musculoskeletal:no cyanosis of digits and no clubbing  NEURO: alert & oriented x 3 with fluent speech, no focal motor deficits. Moderately diminished vibratory sense to feet per tuning fork exam  PAC without erythema   LABORATORY DATA:  I have reviewed the data as listed CBC Latest Ref Rng & Units 03/07/2018 02/21/2018 02/07/2018  WBC 4.0 - 10.5 K/uL 3.4(L) 4.3 4.0  Hemoglobin 13.0 - 17.0 g/dL 12.6(L) 13.0 13.1  Hematocrit 39.0 - 52.0 % 37.6(L) 38.4(L) 37.0(L)  Platelets 150 - 400 K/uL 100(L) 122(L) 152     CMP Latest Ref Rng & Units 03/07/2018 02/21/2018 02/07/2018  Glucose 70 - 99 mg/dL 130(H) 100(H) 105(H)  BUN 6 - 20 mg/dL '12 10 13  '$ Creatinine 0.61 - 1.24 mg/dL 1.08 1.13 1.02  Sodium 135 - 145 mmol/L 139 139 141  Potassium 3.5 - 5.1 mmol/L 4.1 4.6 4.4  Chloride 98 - 111 mmol/L 105 103 107  CO2 22 - 32 mmol/L 21(L) 23 21(L)  Calcium 8.9 - 10.3 mg/dL 9.0 9.1 9.3  Total Protein 6.5 - 8.1 g/dL 6.8 7.1 7.5  Total Bilirubin 0.3 - 1.2 mg/dL 0.4 0.5 0.3  Alkaline Phos 38 - 126 U/L 54 61 55  AST 15 - 41 U/L '22 20 20  '$ ALT 0 - 44 U/L '22 18 24   '$ TUMOR MARKER Results for Nicholas Lowery, Nicholas Lowery (MRN [id number]) as of 02/15/2018 16:32  Ref. Range 11/14/2017 09:30 12/13/2017 11:48 01/10/2018 07:59 02/07/2018 08:53  CEA ([facility]In House) Latest Ref Range: 0.00 - 5.00 ng/mL 4.82 6.93 (H) 6.99 (H) 8.89 (H)  CEA 02/21/18 PENDING    PATHOLOGY   Diagnosis 06/14/2016 1. Colon, segmental resection for tumor, Right  Ascending Hemicolectomy and Small Bowel - INVASIVE WELL TO MODERATELY DIFFERENTIATED ADENOCARCINOMA. - TWO TUMOR FOCI MEASURING 7.5 CM AND 4.5 CM IN GREATEST DIMENSION. - TUMOR INVADES THROUGH FULL THICKNESS OF COLON, THROUGH THE SEROSA TO INVOLVE THE SMALL BOWEL. - MARGINS ARE NEGATIVE. - TWENTY FOUR OF SIXTY FOUR LYMPH NODES POSITIVE FOR METASTATIC ADENOCARCINOMA (24/64). - EXTRACAPSULAR EXTENSION IDENTIFIED - MULTIPLE SATELLITE TUMOR DEPOSITS PRESENT. - SEE ONCOLOGY TEMPLATE. 2. Liver, needle/core biopsy, Right Inferior - POSITIVE FOR METASTATIC ADENOCARCINOMA.       RADIOGRAPHIC STUDIES: I have personally reviewed the radiological images as listed and agreed with the findings in the report. No results found.   ASSESSMENT & PLAN: Nicholas Lowery is a 57 y.o. Caucasian male, without significant past medical history, presented with rectal bleeding in December 2016, colonoscopy showed 4 polyps, 2 of them showed invasive adenocarcinoma, unfortunately he was not aware of the pathology findings and was not treated. He now presented with fatigue, weight loss, anorexia and abdominal pain  1. Right colon cancer with liver, node and probable lung metastasis, [id number], stage IV, MSI-stable, KRAS G12D mutation (+) -Nicholas Lowery appears stable. He completed another cycle FOLFOX/avastin. He is tolerating moderately well, except mild nausea and moderate neuropathy. Labs reviewed, CBC and CMP adequate to continue treatment. PLT 100K. Will continue current dose.  -CEA trending up slightly, 11.5 now; we are restaging after this cycle  -He is scheduled for restaging CT 11/5 before next cycle.  -He is gathering information about clinical  trials in the region, he saw [facility]. Jimmy Footman recently [facility] who agrees with FOLFOX for now pending restaging. Further recommendations are available in MD's note in care everywhere. -proceed with next cycle today, f/u in 2 weeks with CT results and next  cycle.  2. Depression, Anxiety  -Pt has long-standing history of depression, has been on Prozac for decades, it was very well controlled in the past.   -He has become more depressed lately, due to the stress from cancer diagnosis, especially stage IV, with prolonged chemotherapy, stress from work and family responsibility, etc.  -coping with increased alcohol consumption, encouraged to decrease drinking -continue f/u with Dr. Cheryln Manly  3. Hypertension  -We previously discussed Avastin can cause hypertension - Continue lisinopril and Amlodapine.   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 -Hgb 12.6 today, iron studies are adequate, no need for additional IV Feraheme   5. Anorexia and weight loss secondary to chemo  -Secondary to underlying malignancy -weight increasing lately, due to diet and alcohol use and low physical activity. I encouraged him to be physically active to help with fatigue and BP as well  6. Peripheral neuropathy, G1  -Secondary to oxaliplatin, dose reduced and ultimately discontinued in 11/2016 -neuropathy has increased again since restarting oxaliplatin in 12/2017 -dose reduced from 60 mg/m2 to 48 mg/m2 on 10/10 -he has no significant functional limitations, but has moderately diminished vibratory sense to feet per tuning fork exam, will continue oxaliplatin at current dose-reduction and monitor closely. I explained PT may be helpful if he has further CIPN, he declined for now  7. Goal of care discussion  -The patient understands the goal of care is palliative.  -He is full code now   8. Left axillary cyst -shave biopsy on 02/27/18 shows inflamed epidermoid cyst     Plan  -Labs reviewed, proceed with next cycle FOLFOX/avastin today, with current oxaliplatin dose reduction -Restaging CT 11/5 -Flu vaccine today  -F/u in 2 weeks with CT results and next cycle -Continue mental health f/u with  Dr. Cheryln Manly  -request derm path    Orders Placed This Encounter  Procedures  . CEA (IN HOUSE-[facility])   All questions were answered. The patient knows to call the clinic with any problems, questions or concerns. No barriers to learning was detected. I spent 20 minutes counseling the patient face to face. The total time spent in the appointment was 25 minutes and more than 50% was on counseling and review of test results     Nicholas Feeling, NP 03/07/18

## 2018-03-08 ENCOUNTER — Telehealth: Payer: Self-pay | Admitting: Nurse Practitioner

## 2018-03-08 NOTE — Telephone Encounter (Signed)
scheduled appt per 10/24 los - left message for patient with appt date and time.

## 2018-03-09 ENCOUNTER — Inpatient Hospital Stay: Payer: BLUE CROSS/BLUE SHIELD

## 2018-03-09 VITALS — BP 132/90 | HR 74 | Temp 97.6°F | Resp 18

## 2018-03-09 DIAGNOSIS — C787 Secondary malignant neoplasm of liver and intrahepatic bile duct: Principal | ICD-10-CM

## 2018-03-09 DIAGNOSIS — C189 Malignant neoplasm of colon, unspecified: Secondary | ICD-10-CM

## 2018-03-09 DIAGNOSIS — Z95828 Presence of other vascular implants and grafts: Secondary | ICD-10-CM

## 2018-03-09 DIAGNOSIS — C182 Malignant neoplasm of ascending colon: Secondary | ICD-10-CM | POA: Diagnosis not present

## 2018-03-09 MED ORDER — HEPARIN SOD (PORK) LOCK FLUSH 100 UNIT/ML IV SOLN
500.0000 [IU] | Freq: Once | INTRAVENOUS | Status: AC
  Administered 2018-03-09: 500 [IU]
  Filled 2018-03-09: qty 5

## 2018-03-09 MED ORDER — SODIUM CHLORIDE 0.9% FLUSH
10.0000 mL | Freq: Once | INTRAVENOUS | Status: AC
  Administered 2018-03-09: 10 mL
  Filled 2018-03-09: qty 10

## 2018-03-09 NOTE — Patient Instructions (Signed)
Implanted Port Home Guide An implanted port is a type of central line that is placed under the skin. Central lines are used to provide IV access when treatment or nutrition needs to be given through a person's veins. Implanted ports are used for long-term IV access. An implanted port may be placed because:  You need IV medicine that would be irritating to the small veins in your hands or arms.  You need long-term IV medicines, such as antibiotics.  You need IV nutrition for a long period.  You need frequent blood draws for lab tests.  You need dialysis.  Implanted ports are usually placed in the chest area, but they can also be placed in the upper arm, the abdomen, or the leg. An implanted port has two main parts:  Reservoir. The reservoir is round and will appear as a small, raised area under your skin. The reservoir is the part where a needle is inserted to give medicines or draw blood.  Catheter. The catheter is a thin, flexible tube that extends from the reservoir. The catheter is placed into a large vein. Medicine that is inserted into the reservoir goes into the catheter and then into the vein.  How will I care for my incision site? Do not get the incision site wet. Bathe or shower as directed by your health care provider. How is my port accessed? Special steps must be taken to access the port:  Before the port is accessed, a numbing cream can be placed on the skin. This helps numb the skin over the port site.  Your health care provider uses a sterile technique to access the port. ? Your health care provider must put on a mask and sterile gloves. ? The skin over your port is cleaned carefully with an antiseptic and allowed to dry. ? The port is gently pinched between sterile gloves, and a needle is inserted into the port.  Only "non-coring" port needles should be used to access the port. Once the port is accessed, a blood return should be checked. This helps ensure that the port  is in the vein and is not clogged.  If your port needs to remain accessed for a constant infusion, a clear (transparent) bandage will be placed over the needle site. The bandage and needle will need to be changed every week, or as directed by your health care provider.  Keep the bandage covering the needle clean and dry. Do not get it wet. Follow your health care provider's instructions on how to take a shower or bath while the port is accessed.  If your port does not need to stay accessed, no bandage is needed over the port.  What is flushing? Flushing helps keep the port from getting clogged. Follow your health care provider's instructions on how and when to flush the port. Ports are usually flushed with saline solution or a medicine called heparin. The need for flushing will depend on how the port is used.  If the port is used for intermittent medicines or blood draws, the port will need to be flushed: ? After medicines have been given. ? After blood has been drawn. ? As part of routine maintenance.  If a constant infusion is running, the port may not need to be flushed.  How long will my port stay implanted? The port can stay in for as long as your health care provider thinks it is needed. When it is time for the port to come out, surgery will be   done to remove it. The procedure is similar to the one performed when the port was put in. When should I seek immediate medical care? When you have an implanted port, you should seek immediate medical care if:  You notice a bad smell coming from the incision site.  You have swelling, redness, or drainage at the incision site.  You have more swelling or pain at the port site or the surrounding area.  You have a fever that is not controlled with medicine.  This information is not intended to replace advice given to you by your health care provider. Make sure you discuss any questions you have with your health care provider. Document  Released: 05/01/2005 Document Revised: 10/07/2015 Document Reviewed: 01/06/2013 Elsevier Interactive Patient Education  2017 [employer]  

## 2018-03-19 ENCOUNTER — Encounter: Payer: Self-pay | Admitting: Nurse Practitioner

## 2018-03-19 ENCOUNTER — Encounter (HOSPITAL_COMMUNITY): Payer: Self-pay

## 2018-03-19 ENCOUNTER — Ambulatory Visit (HOSPITAL_COMMUNITY)
Admission: RE | Admit: 2018-03-19 | Discharge: 2018-03-19 | Disposition: A | Discharge status: Home / Self Care | Payer: BLUE CROSS/BLUE SHIELD | Source: Ambulatory Visit | Attending: Hematology | Admitting: Hematology

## 2018-03-19 DIAGNOSIS — R918 Other nonspecific abnormal finding of lung field: Secondary | ICD-10-CM | POA: Insufficient documentation

## 2018-03-19 DIAGNOSIS — C189 Malignant neoplasm of colon, unspecified: Secondary | ICD-10-CM | POA: Diagnosis not present

## 2018-03-19 DIAGNOSIS — C787 Secondary malignant neoplasm of liver and intrahepatic bile duct: Secondary | ICD-10-CM | POA: Diagnosis not present

## 2018-03-19 MED ORDER — IOHEXOL 300 MG/ML  SOLN
100.0000 mL | Freq: Once | INTRAMUSCULAR | Status: AC | PRN
  Administered 2018-03-19: 100 mL via INTRAVENOUS

## 2018-03-19 MED ORDER — SODIUM CHLORIDE 0.9 % IJ SOLN
INTRAMUSCULAR | Status: AC
  Filled 2018-03-19: qty 50

## 2018-03-19 NOTE — Progress Notes (Signed)
Register  [phone] [phone]  Clinic Follow Up Note  Patient Care Team: Curlene Labrum, MD as PCP - General (Family Medicine)   Date of Service:  03/21/2018   CHIEF COMPLAINTS:  Follow up metastatic right colon cancer   Oncology History   Cancer Staging Metastatic colon cancer to liver [facility]) Staging form: Colon and Rectum, AJCC 8th Edition - Clinical stage from 06/01/2016: Stage IVA (cTX, cNX, pM1a) - Signed by Truitt Merle, MD on 07/04/2016 - Pathologic stage from 06/14/2016: Stage IVA (pT4b(m), pN2b, pM1a) - Signed by Truitt Merle, MD on 07/04/2016       Metastatic colon cancer to liver ([place])   04/2015 Procedure    Colonoscopy by Dr. Ladona Horns. It showed showed 2 sessile polyps ready between 3-5 mm in size located 20 cm (A, B) from the point of entry, polypectomy was performed. Pedunculated polyp was found in the ascending colon (C), polypectomy was performed, and additional polyp (D) was found 30 cm from the point of entry, removed    04/2015 Pathology Results    tubular adenoma (A and B), and well differentiated adenocarcinoma arising from tubulovillous adenoma (C) and well differentiated adenocarcinoma arising from severe dysplasia to intramucosal carcinoma within tubular adenoma.     04/2015 Initial Diagnosis    Metastatic colon cancer to liver ([place])    05/29/2016 Imaging    CT abdomen and pelvis with contrast showed an apple core like stricture in right colon just above the cecum, measuring 3.2 cm in lengths. This is highly suspicious for malignancy. Small lymph node a noticed he had adjacent mesentery, measuring 8 mm. There is a low-density lesion within the inferior aspect of the right hepatic lobe measuring 1.7 cm, suspicious for metastasis.     06/06/2016 Tumor Marker    CEA 9.99    06/08/2016 PET scan    IMPRESSION: Approximately 3 cm hypermetabolic mass in the ascending colon, consistent with primary colon carcinoma. This mass  results in colonic obstruction and small bowel dilatation. Additional areas of hypermetabolic wall thickening in the cecum may represent other sites of colon carcinoma or colitis. Mild hypermetabolic lymphadenopathy in right pericolonic region, porta hepatis, and aortocaval space, consistent with metastatic disease. Mild hypermetabolic mediastinal lymphadenopathy also seen, and thoracic lymph node metastases cannot be excluded. Solitary hypermetabolic focus in inferior right hepatic lobe, consistent with liver metastasis. Consider abdomen MRI without and with contrast for further evaluation.    06/14/2016 Surgery    Hand assisted right hemicolectomy and small bowel resection for colon cancer, liver biopsy, by Dr. Barry Dienes    06/14/2016 Pathology Results    Right hemicolectomy showed invasive well to moderately differentiated adenocarcinoma, 2 foci measuring 7.5 cm and 4.5 cm, tumor invades through full thickness of colon, to the seroma and involve the Small Bowel, Surgical Margins Were Negative, 24 Out Of 64 Lymph Nodes Were Positive, Extracapsular Extension Identified, Multiple Satellite Tumor Deposits Present, Liver Biopsy Showed Metastatic Adenocarcinoma.      06/14/2016 Miscellaneous    Tumor MMR normal, MSI stable     06/14/2016 Miscellaneous    Foundation one genomic testing showed K-ras G12 D mutation, APC and TP53 mutation. No BRAF and NRAS mutation. MSI-stable, tumor burden low.    07/06/2016 Tumor Marker    CEA 13.69    07/13/2016 - 01/18/2017 Chemotherapy    mFOLFOX, every 2 weeks, started on 07/14/2015, Avastin added from cycle 3  Oxaliplatin dose to '60mg'$ /m2 due to side effects and some cytopenia on  09/21/16  Changed to FOLFIRINOX starting cycle 7 and Reduced  Due to neuropathy hold Oxaliplatin and add Irinotecan with neulasta on day 3 starting with cycle 7 Add low dose Oxaliplatin with cycle 8.  Due to his worsening neuropathy, and good response to chemotherapy, I previously stopped  oxaliplatin from cycle 11, and continue FOLFIRI and avastin       07/27/2016 Tumor Marker    CEA 15.85    09/04/2016 Imaging    Ct C/A/P W Contrast IMPRESSION: Interval right colectomy. Stable small liver metastasis in the inferior right hepatic lobe. Stable mild porta hepatis and aortocaval lymphadenopathy. Stable mild mediastinal lymphadenopathy. No new or progressive metastatic disease identified within the chest, abdomen, or pelvis.    12/19/2016 PET scan    IMPRESSION: 1. Right hemicolectomy, with resolution of the prior hypermetabolic activity inferiorly in the right hepatic lobe, in several mediastinal lymph nodes, and in lymph nodes in the retroperitoneum and porta hepatis. No residual hypermetabolic or enlarged lymph nodes are identified. 2. Low-grade diffuse skeletal metabolic activity is likely therapy related. 3. Coronary atherosclerosis. 4. 3 by 4 mm right middle lobe pulmonary nodule is stable, not appreciably hypermetabolic, but below sensitive PET-CT size thresholds. This may warrant surveillance.    01/11/2017 Imaging    MR Abdomen W WO Contrast IMPRESSION: 1. No acute findings within the abdomen. Previously noted liver metastasis has resolved in the interval. No new lesions.    02/01/2017 - 09/2017 Chemotherapy    Maintenance therapy, Xeloda '2000mg'$  (1000 mg/m2)  q12h on day 1-14 every 21 days plus AVASTIN, starting 02/01/2017.  stopped after 12 days due to poor tolerance on 02/14/17  Changed to maintenance 5-FU and avastin every 2 weeks starting on 02/22/17, stopped in 09/2017.       04/10/2017 Imaging    IMPRESSION: 1. Status post right hemicolectomy without findings for recurrent tumor. 2. No worrisome hepatic lesions. Treated disease with only a small residual low attenuation lesion in the right hepatic lobe. 3. No recurrent mediastinal or abdominal lymphadenopathy.     07/09/2017 PET scan    PET 07/09/17 IMPRESSION: 1. Status post right hemicolectomy,  without findings of hypermetabolic recurrent or metastatic disease. 2. A right middle lobe pulmonary nodule is unchanged. However, there is a right lower lobe 5 mm pulmonary nodule which is felt to be new and enlarged compared to prior exams. Suspicious for an isolated pulmonary metastasis. Consider CT follow-up at 3-6 months. 3. Age advanced coronary artery atherosclerosis. Recommend assessment of coronary risk factors and consideration of medical therapy. 4. Borderline ascending aortic dilatation, 4.0 cm      10/10/2017 Imaging    IMPRESSION: 1. Several (at least 10) subcentimeter pulmonary nodules scattered in both lungs, predominantly in the lower lobes, all new/increased, most compatible with enlarging pulmonary metastases, largest 8 mm in the right lower lobe. 2. No additional findings of new or progressive metastatic disease. No recurrent adenopathy. Stable small low-attenuation lesion in the inferior right liver lobe compatible with treated metastasis. No new liver metastases. 3. Stable ectatic 4.0 cm ascending thoracic aorta. Recommend annual imaging followup by CTA or MRA.  4. Stable mild splenomegaly.     10/11/2017 - 12/2017 Chemotherapy    FOLFIRI and Avastin every 2 weeks starting 10/11/17, irnotecan stopped in 12/2017 due to disease progression      01/08/2018 Imaging    01/08/2018 CT CAP IMPRESSION: 1. Progressive hepatic and pulmonary metastatic disease. 2. Ascending Aortic aneurysm NOS (ICD10-I71.9).    01/08/2018 Progression  01/08/2018 CT CAP IMPRESSION: 1. Progressive hepatic and pulmonary metastatic disease. 2. Ascending Aortic aneurysm NOS (ICD10-I71.9).    01/10/2018 -  Chemotherapy    FOLFOX and Avastin every 2 weeks starting on 01/10/2018.     03/19/2018 Imaging    03/19/2018 CT CAP IMPRESSION: 1. Interval increase in size of dominant nodule within the right lower lobe. There are a few additional nodules which have increased in size. Multiple additional  small bilateral pulmonary nodules are grossly similar. 2. Slight interval increase in size of lesion within the right hepatic lobe. 3. Interval increase in soft tissue at the surgical anastomosis involving the small bowel within the central abdomen. Locally recurrent disease not excluded.    03/19/2018 Progression    03/19/2018 CT CAP IMPRESSION: 1. Interval increase in size of dominant nodule within the right lower lobe. There are a few additional nodules which have increased in size. Multiple additional small bilateral pulmonary nodules are grossly similar. 2. Slight interval increase in size of lesion within the right hepatic lobe. 3. Interval increase in soft tissue at the surgical anastomosis involving the small bowel within the central abdomen. Locally recurrent disease not excluded.     03/21/2018 -  Chemotherapy    The patient had bevacizumab (AVASTIN) 475 mg in sodium chloride 0.9 % 100 mL chemo infusion, 5 mg/kg = 475 mg, Intravenous,  Once, 1 of 6 cycles Administration: 475 mg (03/21/2018)  for chemotherapy treatment.      HISTORY OF PRESENTING ILLNESS (06/01/2016):  Nicholas Lowery 57 y.o. male is here because of his recently abdominal CT which is highly suspicious for metastatic colon cancer. He is accompanied by his wife to my clinic today. He was referred by his primary care physician Dr. Pleas Koch.   He had colonoscopy in 2016 for mild rectal bleeding, he describe small amount fresh blood mixed with stool, he has no other constitutional symptoms at that time. He was referred to gastroenterologist Dr. Ladona Horns and underwent a colonoscopy in December 2016. The colonoscopy showed 2 sessile polyps ready between 3-5 mm in size located 20 cm (A, B) from the point of entry, polypectomy was performed. Pedunculated polyp was found in the ascending colon (C), polypectomy was performed, and additional polyp (D) was found 30 cm from the point of entry, removed. The pathology reviewed  tubular adenoma (A and B), and well differentiated adenocarcinoma arising from tubulovillous adenoma (C) and well differentiated adenocarcinoma arising from severe dysplasia to intramucosal carcinoma within tubular adenoma. Dr. Tye Maryland tried multiple times to reach patient, but patient sought they were calling him about the bill, and did not return the phone calls. He was not aware the cancer diagnosis until recently.   He started having diarrhea and vomiting in mid Dec 2017, and felt a "pop" in right side abdomen, he was seen at urgent care, and was treated with antiemetics, and IVF, lab tests were OK. Due to his persistent intermittent diarrhea and epigastric pain since then, he was seen by PCP and he eventually had CT abdomen and pelvis scan which showed a upper core lesion in the ascending colon, and I'll 1.7 cm lesion in the liver, highly suspicious for metastasis. He was referred to [place] for further evaluation.  He has lost 30 lbs in the past one month, has low appetite, eats a small meals 1-2 times a day. He has moderate fatigue, able to tolerate routine activities including his work, but feels exhausted at the end of study. He has occasional constipation, denies recent rectal  bleeding.  CURRENT THERAPY:  Pending third line Xeloda and IV Avastin every 2 weeks   INTERIM HISTORY:  Nicholas Lowery is a 57 y.o. male who returns today for follow up. He has seen NP Lacie in the interim, he is tolerating chemo well overall.   Today, he is here with his wife. He is doing well and is busy with work. He is not traveling as much now. He states that he recently started experiencing sharp shooting pain at the back of his neck for a week. He also reported having a skin lesion removed from his left axillary area. He also noted episodes of diarrhea. He has 3 loose stools yesterday. He uses imodium as needed and denies cramping.  He says that his appetite was reduced after last cycle and he has not been eating  well.  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 He can still function well. He has a mild dry cough that doesn't wake him up at night.     MEDICAL HISTORY:  Past Medical History:  Diagnosis Date  . Anxiety   . Cancer of ascending colon ([place])   . Seasonal allergies     SURGICAL HISTORY: Past Surgical History:  Procedure Laterality Date  . APPENDECTOMY  1992  . CATARACT EXTRACTION W/ INTRAOCULAR LENS IMPLANT Left 04/2016  . COLON SURGERY    . COLONOSCOPY W/ BIOPSIES AND POLYPECTOMY  04/2015  . INGUINAL HERNIA REPAIR Right 1982  . IR RADIOLOGIST EVAL & MGMT  01/16/2017  . KNEE ARTHROSCOPY W/ MENISCECTOMY Right 1998  . LAPAROSCOPIC RIGHT COLECTOMY N/A 06/14/2016   Procedure: LAPAROSCOPIC HAND ASSISTED HEMICOLECTOMY AND SMALL BOWEL RESECTION.;  Surgeon: [name], MD;  Location: [place];  Service: General;  Laterality: N/A;  . LIVER BIOPSY Right 06/14/2016   Procedure: LIVER BIOPSY;  Surgeon: [name], MD;  Location: [place];  Service: General;  Laterality: Right;  Right Inferior Liver  . PORTACATH PLACEMENT N/A 07/06/2016   Procedure: INSERTION PORT-A-CATH;  Surgeon: [name], MD;  Location: [place];  Service: General;  Laterality: N/A;  . VASECTOMY  2005    SOCIAL HISTORY: Social History   Socioeconomic History  . Marital status: Married    Spouse name: Not on file  . Number of children: Not on file  . Years of education: Not on file  . Highest education level: Not on file  Occupational History  . Not on file  Social Needs  . Financial resource strain: Not on file  . Food insecurity:    Worry: Not on file    Inability: Not on file  . Transportation needs:    Medical: Not on file    Non-medical: Not on file  Tobacco Use  . Smoking status: Former Smoker    Years: 2.00    Types: Cigarettes    Last attempt to quit: 1990    Years since quitting: 29.8  . Smokeless tobacco: Never Used  Substance and Sexual Activity  . Alcohol use: Yes     Alcohol/week: 4.0 standard drinks    Types: 4 Cans of beer per week    Comment: social, none since colon surgery  . Drug use: No    Comment: 06/15/2016 "nothing since college"  . Sexual activity: Yes  Lifestyle  . Physical activity:    Days per week: Not on file    Minutes per session: Not on file  . Stress: Not on file  Relationships  . Social connections:    Talks  on phone: Not on file    Gets together: Not on file    Attends religious service: Not on file    Active member of club or organization: Not on file    Attends meetings of clubs or organizations: Not on file    Relationship status: Not on file  . Intimate partner violence:    Fear of current or ex partner: Not on file    Emotionally abused: Not on file    Physically abused: Not on file    Forced sexual activity: Not on file  Other Topics Concern  . Not on file  Social History Narrative  . Not on file   He is married. They have 3 boys, 72-12 yo. He works for a [employer], desk job.   FAMILY HISTORY: Family History  Problem Relation Age of Onset  . Cancer Mother        lung cancer  . Stroke Mother   . Hypertension Father   . CAD Father   . Cancer Maternal Grandfather        prostate cancer     ALLERGIES:  is allergic to penicillins and xeloda [capecitabine].  MEDICATIONS:  Current Outpatient Medications  Medication Sig Dispense Refill  . amLODipine (NORVASC) 10 MG tablet Take 1 tablet (10 mg total) by mouth daily. 30 tablet 2  . celecoxib (CELEBREX) 200 MG capsule Take 1 capsule (200 mg total) by mouth daily. 60 capsule 1  . dexamethasone (DECADRON) 4 MG tablet UP TO THREE DAYS AFTER CHEMO (BEGIN DAY TWO) 15 tablet 0  . diphenoxylate-atropine (LOMOTIL) 2.5-0.025 MG tablet Take 2 tablets by mouth 4 (four) times daily as needed for diarrhea or loose stools. 30 tablet 0  . lisinopril (PRINIVIL,ZESTRIL) 10 MG tablet Take 2 tablets (20 mg total) by mouth daily. 180 tablet 2  . LORazepam (ATIVAN) 0.5 MG  tablet Take 1 tablet (0.5 mg total) by mouth every 8 (eight) hours as needed (for nausea). 30 tablet 0  . Multiple Vitamin (MULTIVITAMIN WITH MINERALS) TABS tablet Take 1 tablet by mouth daily.    Marland Kitchen omeprazole (PRILOSEC) 20 MG capsule Take 1 capsule (20 mg total) by mouth 2 (two) times daily before a meal. 60 capsule 2  . ondansetron (ZOFRAN ODT) 8 MG disintegrating tablet Take 1 tablet (8 mg total) by mouth every 8 (eight) hours as needed for nausea or vomiting. 30 tablet 2  . prochlorperazine (COMPAZINE) 10 MG tablet Take 1 tablet (10 mg total) by mouth every 6 (six) hours as needed for nausea or vomiting. 30 tablet 3  . traMADol (ULTRAM) 50 MG tablet Take 1 tablet (50 mg total) by mouth every 6 (six) hours as needed. 30 tablet 0  . urea (CARMOL) 10 % cream Apply topically 3 (three) times daily. 71 g 1  . venlafaxine XR (EFFEXOR XR) 150 MG 24 hr capsule Take 1 capsule (150 mg total) by mouth daily with breakfast. 90 capsule 1   No current facility-administered medications for this visit.    Facility-Administered Medications Ordered in Other Visits  Medication Dose Route Frequency Provider Last Rate Last Dose  . clindamycin (CLEOCIN) 900 mg in dextrose 5 % 50 mL IVPB  900 mg Intravenous 60 min Pre-Op [name], MD       And  . gentamicin (GARAMYCIN) 450 mg in dextrose 5 % 50 mL IVPB  5 mg/kg Intravenous 60 min Pre-Op [name], MD      . dexamethasone (DECADRON) injection 10 mg  10 mg  Intravenous Once Harle Stanford., PA-C      . diphenoxylate-atropine (LOMOTIL) 2.5-0.025 MG per tablet 2 tablet  2 tablet Oral Once Harle Stanford., PA-C      . sodium chloride flush (NS) 0.9 % injection 10 mL  10 mL Intracatheter Once Truitt Merle, MD         REVIEW OF SYSTEMS:  Constitutional: Denies fevers, chills or abnormal night sweats (+) lower appetite  Eyes: Denies blurriness of vision, double vision or watery eyes Ears, nose, mouth, throat, and face:  Respiratory: (+) dry cough  Cardiovascular:  Denies palpitation, chest discomfort or lower extremity swelling Gastrointestinal:  Denies change in bowel habits  (+) diarrhea Lymphatics: Denies new lymphadenopathy or easy bruising Neurological: No new or worsening neuropathy  MSK: No new joint pain or myalgia (+) sharp shooting back at the back of his neck Behavioral/Psych: (+) depression and anxiety All other systems were reviewed with the patient and are negative.  PHYSICAL EXAMINATION:  ECOG PERFORMANCE STATUS: 1 - Symptomatic but completely ambulatory Vitals:   03/21/18 1008  BP: (!) 129/96  Pulse: 71  Resp: 20  Temp: 98.7 F (37.1 C)  SpO2: 100%    GENERAL:alert, no distress and comfortable. SKIN: skin color, texture, turgor are normal, no rashes or significant lesions (+) left axillary mass, likely a boil, mildly tender to touch EYES: normal, conjunctiva are pink and non-injected, sclera clear OROPHARYNX:no exudate, no erythema and lips, buccal mucosa, and tongue normal  NECK: supple, thyroid normal size, non-tender, without nodularity LYMPH:  no palpable lymphadenopathy in the cervical, axillary or inguinal LUNGS: clear to auscultation and percussion with normal breathing effort HEART: regular rate & rhythm and no murmurs and no lower extremity edema ABDOMEN:abdomen soft, Surgical scar in the midline around the umbilical has healed well. non-tender and normal bowel sounds Musculoskeletal:no cyanosis of digits and no clubbing  PSYCH: alert & oriented x 3 with fluent speech (+) stressed out and emotional NEURO: no focal motor/sensory deficits   LABORATORY DATA:  I have reviewed the data as listed CBC Latest Ref Rng & Units 03/21/2018 03/07/2018 02/21/2018  WBC 4.0 - 10.5 K/uL 4.0 3.4(L) 4.3  Hemoglobin 13.0 - 17.0 g/dL 12.8(L) 12.6(L) 13.0  Hematocrit 39.0 - 52.0 % 38.5(L) 37.6(L) 38.4(L)  Platelets 150 - 400 K/uL 126(L) 100(L) 122(L)   CMP Latest Ref Rng & Units 03/21/2018 03/07/2018 02/21/2018  Glucose 70 - 99 mg/dL  106(H) 130(H) 100(H)  BUN 6 - 20 mg/dL '9 12 10  '$ Creatinine 0.61 - 1.24 mg/dL 1.03 1.08 1.13  Sodium 135 - 145 mmol/L 138 139 139  Potassium 3.5 - 5.1 mmol/L 4.5 4.1 4.6  Chloride 98 - 111 mmol/L 104 105 103  CO2 22 - 32 mmol/L 23 21(L) 23  Calcium 8.9 - 10.3 mg/dL 8.8(L) 9.0 9.1  Total Protein 6.5 - 8.1 g/dL 6.9 6.8 7.1  Total Bilirubin 0.3 - 1.2 mg/dL 0.4 0.4 0.5  Alkaline Phos 38 - 126 U/L 56 54 61  AST 15 - 41 U/L '21 22 20  '$ ALT 0 - 44 U/L '21 22 18   '$ TUMOR MARKER 06/06/2016: 9.99 07/06/2017: 13.69 07/27/2016: 15.35 01/04/2017: 4.02 04/12/2017: 4.36 11/14/17: 4.82 12/13/17: 6.93 01/10/18: 6.99 02/07/18: 8.09 03/07/18: 11.53  PATHOLOGY   Diagnosis 06/14/2016 1. Colon, segmental resection for tumor, Right Ascending Hemicolectomy and Small Bowel - INVASIVE WELL TO MODERATELY DIFFERENTIATED ADENOCARCINOMA. - TWO TUMOR FOCI MEASURING 7.5 CM AND 4.5 CM IN GREATEST DIMENSION. - TUMOR INVADES THROUGH FULL THICKNESS OF COLON,  THROUGH THE SEROSA TO INVOLVE THE SMALL BOWEL. - MARGINS ARE NEGATIVE. - TWENTY FOUR OF SIXTY FOUR LYMPH NODES POSITIVE FOR METASTATIC ADENOCARCINOMA (24/64). - EXTRACAPSULAR EXTENSION IDENTIFIED - MULTIPLE SATELLITE TUMOR DEPOSITS PRESENT. - SEE ONCOLOGY TEMPLATE. 2. Liver, needle/core biopsy, Right Inferior - POSITIVE FOR METASTATIC ADENOCARCINOMA.    RADIOGRAPHIC STUDIES: I have personally reviewed his outside CT scan from 05/29/2016 and agreed with the findings in the report.  03/19/2018 CT CAP IMPRESSION: 1. Interval increase in size of dominant nodule within the right lower lobe. There are a few additional nodules which have increased in size. Multiple additional small bilateral pulmonary nodules are grossly similar. 2. Slight interval increase in size of lesion within the right hepatic lobe. 3. Interval increase in soft tissue at the surgical anastomosis involving the small bowel within the central abdomen. Locally recurrent disease not  excluded.  01/08/2018 CT CAP IMPRESSION: 1. Progressive hepatic and pulmonary metastatic disease. 2. Ascending Aortic aneurysm NOS (ICD10-I71.9).   MR Abdomen W WO Contrast 01/11/2017 IMPRESSION: 1. No acute findings within the abdomen. Previously noted liver metastasis has resolved in the interval. No new lesions.  CT CAP 09/04/16 IMPRESSION: Interval right colectomy. Stable small liver metastasis in the inferior right hepatic lobe. Stable mild porta hepatis and aortocaval lymphadenopathy. Stable mild mediastinal lymphadenopathy. No new or progressive metastatic disease identified within the chest, abdomen, or pelvis.  ASSESSMENT & PLAN:   Nicholas Lowery is a 57 y.o. Caucasian male, without significant past medical history, presented with rectal bleeding in December 2016, colonoscopy showed 4 polyps, 2 of them showed invasive adenocarcinoma, unfortunately he was not aware of the pathology findings and was not treated. He now presented with fatigue, weight loss, anorexia and abdominal pain  1. Right colon cancer with liver, node and lung metastasis, pT4bN2bM1, stage IV, MSI-stable, KRAS G12D mutation (+) -I previously reviewed her PET scan findings, which showed solitary liver metastasis, abdominal and possible thoracic node metastasis  -His liver biopsy confirmed metastasis -Due to the bowel obstruction, he underwent upfront hemicolectomy. I previously reviewed his surgical pathology findings, which showed 2 primary right colon cancer, very locally advanced with 24 lymph nodes positive, surgical margins were negative. -I previously discussed his Foundation one genomic testing results, which showed care arrest mutation, and as high stable, low tumor burden. So he would not benefit from EGFR inhibitor, or immunotherapy alone. -The was started on palliative first-line chemotherapy FOLFOX on 07/13/16. Avastin was added from cycle 3, which was changed to FOLFIRINOX starting with cycle 7  with low dose oxaliplatin. He had near complete metabolic response from chemotherapy, post his liver metastasis and hypermetabolic lymph nodes are negative on PET scan with decreased size.  Liver targeted therapy was discussed, but not offered due to the no evidence disease on the MRI. -His chemo was subsequently changed to Xeloda and Avastin maintenance therapy 02/01/17, due to very poor tolerance he stopped Xeloda 02/14/17 -he has been on  5-Fu pump and avastin starting 02/22/17.  -He was started on FOLFIRI on 10/11/17 and continued avastin every 2 weeks, due to disease progression in lungs.  -His restaging CT on January 08, 2018 unfortunately showed enlarged liver metastasis from 1.0 cm to 1.6 cm, and several subcentimeter pulmonary nodules scattered in both lungs, new or increased from prior scan, most compatible with worsening pulmonary metastasis. No other new lesions.  Clinically doing well. -FOLFIRI was changed to FOLFOX with dose reduction due to progression, he is tolerating well overall  -I personally reviewed his restaging  CT scan images from March 19, 2018, and compared to his prior scan, unfortunately it showed disease progression in lung and liver, also overall disease burden are still low. -We will stop FOLFOX due to disease progression. -We again discussed the overall prognosis at this point is poor due to the disease progression, and he has had multiple line treatment.  He does have a few lines of other FDA approved treatment options, such as Lonsurf and regorafenib, however overall response rates are low and benefit is limited  -Due to the multiorgan metastasis, he is not a candidate for organ targeted therapy. -I strongly encouraged him to consider clinical trial. He is young, asymptomatic, with good performance status and organ functions, will be a good candidate for clinical trial. He is interested. He recently went to [facility] and saw Dr. Jimmy Footman but there is no suitable clinical trial  right now at [facility].  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 If yes, I will refer him  -I also searched clinical trial options on clinical trials.gov for K-ras mutated metastatic colorectal cancer, and found a immunotherapy trial at [place], I gave patient the relevant information, for him to review and consider. -While he is waiting for clinical trial option, I recommend him to switch treatment to Xeloda and IV Avastin every 2 weeks.  He previously had poor tolerance to Xeloda, I will change it to 1 week on, one-week off, and start the first cycle with lower dose. -Potential side effects discussed with patient again, he agrees to try. -He will continue Avastin infusion today, and every 2 weeks.  I will prescribe Xeloda '2000mg'$  bid 7 days on, 7 days off, for him.  First cycle will dose reduce to '1500mg'$  bid  -Follow-up in one month with NP Lacie   2. Depression, Anxiety  -Pt has long-standing history of depression, has been on Prozac for decades, it was very well controlled in the past.   -He has become more depressed lately, due to the stress from cancer diagnosis, especially stage IV, with prolonged chemotherapy, stress from work and family responsibility, etc.  He has developed multiple symptoms, including depressed mood, insomnia, personality change, etc.  He also has suicidal ideas. -He has good family and social support.   -We previously had a long discussion about depression management.  -I previously discussed the role of antidepressants, he has been on Prozac 40 mg for several years. I suggest changing to Effexor. He has weaned off Prozac in 2 weeks. Started Effexor '75mg'$  on week two of taper, will titrate dose if needed. -I also suggested he start attending GI Cancer support group, I had GI Navigator Dawn speak with him more about this and other resources.  Dawn recommend him to see psychologist Earl Lites, he has met her twice, but does not feel it is very helpful. -His mood has improved, but he still has episodes of crying, and anger. He is on Effexor 150 mg daily, will continue. -He broke into tears when we discussed his previous CT scan findings and disease progression. He has good social support from his family.  Emotional support provided. I previously contacted social worker Lauren to meet with him.  -He copes by drinking more alcohol. I have discouraged him to drink alcohol  -He states that he sleeps to cope with stress and bothering thoughts.   3. Hypertension  -We  previously discussed Avastin can cause hypertension -I previously started him on amlodipine, and increase dose to 10 mg daily -He does not want to follow-up with his primary care physician. -Previously, his blood pressure was noted to be elevated, especially diastolic. I previously added on lisinopril 10 mg daily for him -Continue Norvasc and Lisinopril for now while on Avastin -Will continue to monitor -BP better controlled with Lisinopril and amlodipine - I previously recommended him to monitor his blood pressure at home, and increase lisinopril from 10 mg to 20 mg.  - Continue lisinopril and Amlodapine.   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 -I previously recommend him to take oral iron supplement over-the-counter, potential side effects of constipation and gastric or discomfort or discussed with him. He stopped taking this before surgery. -I previously advised him to begin taking oral iron supplements again post surgery -Hg normal and stable at 12.8 and iron study has been normal lately. Will continue to monitor.   5. Anorexia and weight loss secondary to chemo  -Secondary to underlying malignancy -I previously encouraged him to try nutritional supplement, such as boost or initial and try to eat  more -He was encouraged to continue eating well and take supplemental drinks as needed to better maintain weight.  -I previously discussed how his depression can effect his appetite and to make sure he is eating and drinking well.  -He has been seen by dietitian  -I again encouraged him to take a nutritional supplement  6. Peripheral neuropathy, G1 -Secondary to oxaliplatin, dose reduced and ultimately discontinued -Slightly worsened when he was on FOLFIRI, held Irinotecan beginning 01/04/17 -I previously discussed with patient this may take some time to resolve.  -I strongly encouraged him to take vitamin B-complex supplements -Hand function normalized but overall slightly worse numbness on his toes, will continue to monitor.   7. Goal of care discussion  -We previously discussed the incurable nature of his cancer, and the overall poor prognosis, especially if he does not have good response to chemotherapy or progress on chemo -The patient understands the goal of care is palliative.  -He is full code now     Plan  -Will start Xeloda '1500mg'$  bid, 7 days on and 7 days off, next week, prescription will be sent out today or tomorrow.  If he tolerates well, will increase to '2000mg'$  bid from cycle 2  - IV Avastin today and continue every 2 weeks -I will refill amlodipine -follow-up in one month with NP Lacie -I will contact [facility] to see if they have clinical trial options    No orders of the defined types were placed in this encounter.   All questions were answered.   The patient knows to call the clinic with any problems, questions or concerns.  I spent 30 minutes counseling the patient face to face. The total time spent in the appointment was 40 minutes and more than 50% was on counseling.  Dierdre Searles Dweik am acting as scribe for Dr. Truitt Merle.  I have reviewed the above documentation for accuracy and completeness, and I agree with the above.    Truitt Merle  03/21/2018

## 2018-03-19 NOTE — Progress Notes (Signed)
Opened encounter to print most recent AVS.

## 2018-03-20 ENCOUNTER — Ambulatory Visit: Payer: BLUE CROSS/BLUE SHIELD | Admitting: Psychology

## 2018-03-21 ENCOUNTER — Telehealth: Payer: Self-pay | Admitting: Hematology

## 2018-03-21 ENCOUNTER — Inpatient Hospital Stay: Payer: BLUE CROSS/BLUE SHIELD

## 2018-03-21 ENCOUNTER — Encounter: Payer: Self-pay | Admitting: Hematology

## 2018-03-21 ENCOUNTER — Inpatient Hospital Stay: Payer: BLUE CROSS/BLUE SHIELD | Attending: Hematology | Admitting: Hematology

## 2018-03-21 ENCOUNTER — Encounter: Payer: Self-pay | Admitting: *Deleted

## 2018-03-21 VITALS — BP 129/96 | HR 71 | Temp 98.7°F | Resp 20 | Ht 70.0 in | Wt 210.4 lb

## 2018-03-21 DIAGNOSIS — C7800 Secondary malignant neoplasm of unspecified lung: Secondary | ICD-10-CM | POA: Diagnosis not present

## 2018-03-21 DIAGNOSIS — C189 Malignant neoplasm of colon, unspecified: Secondary | ICD-10-CM

## 2018-03-21 DIAGNOSIS — G4700 Insomnia, unspecified: Secondary | ICD-10-CM | POA: Diagnosis not present

## 2018-03-21 DIAGNOSIS — Z9049 Acquired absence of other specified parts of digestive tract: Secondary | ICD-10-CM | POA: Insufficient documentation

## 2018-03-21 DIAGNOSIS — I2510 Atherosclerotic heart disease of native coronary artery without angina pectoris: Secondary | ICD-10-CM | POA: Diagnosis not present

## 2018-03-21 DIAGNOSIS — I10 Essential (primary) hypertension: Secondary | ICD-10-CM | POA: Insufficient documentation

## 2018-03-21 DIAGNOSIS — F419 Anxiety disorder, unspecified: Secondary | ICD-10-CM | POA: Insufficient documentation

## 2018-03-21 DIAGNOSIS — Z79899 Other long term (current) drug therapy: Secondary | ICD-10-CM | POA: Diagnosis not present

## 2018-03-21 DIAGNOSIS — C182 Malignant neoplasm of ascending colon: Secondary | ICD-10-CM | POA: Insufficient documentation

## 2018-03-21 DIAGNOSIS — Z87891 Personal history of nicotine dependence: Secondary | ICD-10-CM | POA: Insufficient documentation

## 2018-03-21 DIAGNOSIS — Z5111 Encounter for antineoplastic chemotherapy: Secondary | ICD-10-CM | POA: Insufficient documentation

## 2018-03-21 DIAGNOSIS — G629 Polyneuropathy, unspecified: Secondary | ICD-10-CM | POA: Insufficient documentation

## 2018-03-21 DIAGNOSIS — D509 Iron deficiency anemia, unspecified: Secondary | ICD-10-CM | POA: Insufficient documentation

## 2018-03-21 DIAGNOSIS — R030 Elevated blood-pressure reading, without diagnosis of hypertension: Secondary | ICD-10-CM

## 2018-03-21 DIAGNOSIS — F329 Major depressive disorder, single episode, unspecified: Secondary | ICD-10-CM | POA: Insufficient documentation

## 2018-03-21 DIAGNOSIS — C787 Secondary malignant neoplasm of liver and intrahepatic bile duct: Principal | ICD-10-CM

## 2018-03-21 DIAGNOSIS — D500 Iron deficiency anemia secondary to blood loss (chronic): Secondary | ICD-10-CM

## 2018-03-21 DIAGNOSIS — G620 Drug-induced polyneuropathy: Secondary | ICD-10-CM

## 2018-03-21 DIAGNOSIS — T451X5A Adverse effect of antineoplastic and immunosuppressive drugs, initial encounter: Secondary | ICD-10-CM

## 2018-03-21 LAB — CBC WITH DIFFERENTIAL/PLATELET
ABS IMMATURE GRANULOCYTES: 0.01 10*3/uL (ref 0.00–0.07)
BASOS PCT: 1 %
Basophils Absolute: 0 10*3/uL (ref 0.0–0.1)
EOS ABS: 0.1 10*3/uL (ref 0.0–0.5)
Eosinophils Relative: 2 %
HCT: 38.5 % — ABNORMAL LOW (ref 39.0–52.0)
HEMOGLOBIN: 12.8 g/dL — AB (ref 13.0–17.0)
Immature Granulocytes: 0 %
Lymphocytes Relative: 20 %
Lymphs Abs: 0.8 10*3/uL (ref 0.7–4.0)
MCH: 33.2 pg (ref 26.0–34.0)
MCHC: 33.2 g/dL (ref 30.0–36.0)
MCV: 99.7 fL (ref 80.0–100.0)
Monocytes Absolute: 0.7 10*3/uL (ref 0.1–1.0)
Monocytes Relative: 17 %
NEUTROS ABS: 2.4 10*3/uL (ref 1.7–7.7)
NEUTROS PCT: 60 %
NRBC: 0 % (ref 0.0–0.2)
Platelets: 126 10*3/uL — ABNORMAL LOW (ref 150–400)
RBC: 3.86 MIL/uL — AB (ref 4.22–5.81)
RDW: 15 % (ref 11.5–15.5)
WBC: 4 10*3/uL (ref 4.0–10.5)

## 2018-03-21 LAB — COMPREHENSIVE METABOLIC PANEL
ALK PHOS: 56 U/L (ref 38–126)
ALT: 21 U/L (ref 0–44)
AST: 21 U/L (ref 15–41)
Albumin: 3.7 g/dL (ref 3.5–5.0)
Anion gap: 11 (ref 5–15)
BUN: 9 mg/dL (ref 6–20)
CALCIUM: 8.8 mg/dL — AB (ref 8.9–10.3)
CO2: 23 mmol/L (ref 22–32)
CREATININE: 1.03 mg/dL (ref 0.61–1.24)
Chloride: 104 mmol/L (ref 98–111)
GFR calc Af Amer: >60 mL/min (ref >60)
GFR calc non Af Amer: >60 mL/min (ref >60)
GLUCOSE: 106 mg/dL — AB (ref 70–99)
Potassium: 4.5 mmol/L (ref 3.5–5.1)
Sodium: 138 mmol/L (ref 135–145)
Total Bilirubin: 0.4 mg/dL (ref 0.3–1.2)
Total Protein: 6.9 g/dL (ref 6.5–8.1)

## 2018-03-21 LAB — TOTAL PROTEIN, URINE DIPSTICK: Protein, ur: NEGATIVE mg/dL

## 2018-03-21 MED ORDER — SODIUM CHLORIDE 0.9% FLUSH
10.0000 mL | INTRAVENOUS | Status: DC | PRN
  Administered 2018-03-21: 10 mL
  Filled 2018-03-21: qty 10

## 2018-03-21 MED ORDER — AMLODIPINE BESYLATE 10 MG PO TABS: 10.0000 mg | ORAL_TABLET | Freq: Every day | ORAL | 5 refills | Status: DC

## 2018-03-21 MED ORDER — SODIUM CHLORIDE 0.9 % IV SOLN
Freq: Once | INTRAVENOUS | Status: AC
  Administered 2018-03-21: 11:00:00 via INTRAVENOUS
  Filled 2018-03-21: qty 250

## 2018-03-21 MED ORDER — SODIUM CHLORIDE 0.9 % IV SOLN
5.0000 mg/kg | Freq: Once | INTRAVENOUS | Status: AC
  Administered 2018-03-21: 475 mg via INTRAVENOUS
  Filled 2018-03-21: qty 3

## 2018-03-21 MED ORDER — HEPARIN SOD (PORK) LOCK FLUSH 100 UNIT/ML IV SOLN
500.0000 [IU] | Freq: Once | INTRAVENOUS | Status: AC | PRN
  Administered 2018-03-21: 500 [IU]
  Filled 2018-03-21: qty 5

## 2018-03-21 NOTE — Progress Notes (Signed)
[place] Work  Clinical Social Work was referred by Futures trader regarding patient's recent disease progression.  CSW met with patient and spouse in infusion room.  Patient reported while discouraged, he had mentally prepared himself for the discussion with [name] today and was not surprised.  He plans to pursue clinical trial options.   Mr. Shea continues to meet with Dr. Cheryln Manly (psychologist) monthly.  CSW encouraged patient to return to GI support group.  Gwinda Maine, LCSW  Clinical Social Worker [facility]

## 2018-03-21 NOTE — Telephone Encounter (Signed)
No los -1/17  

## 2018-03-21 NOTE — Progress Notes (Signed)
Per [name] no treatment today, notified Lauren in Infusion

## 2018-03-22 ENCOUNTER — Other Ambulatory Visit: Payer: Self-pay | Admitting: Hematology

## 2018-03-22 MED ORDER — CAPECITABINE 500 MG PO TABS: 1000.0000 mg/m2 | ORAL_TABLET | Freq: Two times a day (BID) | ORAL | 0 refills | Status: DC

## 2018-03-23 ENCOUNTER — Inpatient Hospital Stay: Payer: BLUE CROSS/BLUE SHIELD

## 2018-03-25 ENCOUNTER — Telehealth: Payer: Self-pay | Admitting: Pharmacist

## 2018-03-25 ENCOUNTER — Other Ambulatory Visit: Payer: Self-pay | Admitting: Hematology

## 2018-03-25 ENCOUNTER — Telehealth: Payer: Self-pay

## 2018-03-25 DIAGNOSIS — C189 Malignant neoplasm of colon, unspecified: Secondary | ICD-10-CM

## 2018-03-25 DIAGNOSIS — C787 Secondary malignant neoplasm of liver and intrahepatic bile duct: Principal | ICD-10-CM

## 2018-03-25 MED ORDER — CAPECITABINE 500 MG PO TABS: ORAL_TABLET | ORAL | 0 refills | Status: DC

## 2018-03-25 NOTE — Telephone Encounter (Signed)
Oral Oncology Patient Advocate Encounter  Received notification from [facility] that prior authorization for Xeloda is required.  PA submitted on CoverMyMeds Key [id number] Status is pending  Oral Oncology Clinic will continue to follow.  [place] Patient [place] Phone [phone] Fax [phone]

## 2018-03-25 NOTE — Telephone Encounter (Signed)
Oral Oncology Pharmacist Encounter  Received new prescription for Xeloda (capcitabine) for the treatment of metastatic colon cancer in conjunction with Avastin, planned duration until disease progression or unacceptable toxicity.  Patient previously prescribed Xeloda plus Avastin in Sept 2018, at 4 tabs (2000mg ) BID for 14 days on, 7 days off with extremely poor toleration. Patient experienced decrease in appetite, vomiting, mouth sores, loss of feeling in hands and feet, and taste changes. Xeloda was discontinued after 8 days and switched to IV 5-fluorouracil.  Patient is agreeable to Xeloda re-challenge as he awaits options for clinical trials. Xeloda will be initiated at lower dose on a 7 days on, 7 days off schedule, and then up-titrated based on tolerance.  Labs from 03/21/18 assessed, OK for treatment. Noted pltc = 126k, will continue to be monitored  Xeloda will be initiated at ~690 mg/m2 BID (1500mg  BID) for 7 days on, 7 days off; 2 week cycle Xeloda will be increased to ~920 mg/m2 BID (2000mg  BID), starting with cycle 2 if tolerated, frequency will remain at BID 7 days on, 7 days off Avastin will be administered at 5mg /kg every 2 weeks  Current medication list in Epic reviewed, possible DDI with Xeloda and omeprazole identified:  Category C interaction: concurrent use of Xeloda plus proton pump inhibitor was associated with poorer progression free survival and overall survival in a secondary analysis of patients receiving Xeloda with the adjuvant treatment of gastric cancer. Later multivariate analysis showed these effects on important efficacy outcomes was NOT affected. No change to current therapy indicated at this time. Patient will be screened for possible discontinuation of PPI therapy.  Prescription has been e-scribed to the [facility] for benefits analysis and approval.  Oral Oncology Clinic will continue to follow for insurance authorization, copayment  issues, initial counseling and start date.  Johny Drilling, PharmD, BCPS, BCOP  03/25/2018 10:23 AM Oral Oncology Clinic [phone]

## 2018-03-26 NOTE — Telephone Encounter (Signed)
Oral Oncology Patient Advocate Encounter  Prior Authorization for Xeloda has been approved.    PA# [id number] Effective dates: 03/25/18 through 03/26/19  Oral Oncology Clinic will continue to follow.   [place] Patient [place] Phone [phone] Fax [phone]

## 2018-03-27 MED ORDER — CAPECITABINE 500 MG PO TABS: ORAL_TABLET | ORAL | 0 refills | Status: DC

## 2018-03-27 NOTE — Telephone Encounter (Signed)
Oral Chemotherapy Pharmacist Encounter   I spoke with patient for overview of: Xeloda (capecitabine) for the treatment of metastatic colon cancerin conjunction with Avastin, planned durationuntil disease progression or unacceptable toxicity.  Patient previously prescribed Xeloda plus Avastin in Sept 2018, at 4 tabs (2000mg ) BID for 14 days on, 7 days off with extremely poor toleration. Patient experienced decrease in appetite, vomiting, mouth sores, loss of feeling in hands and feet, and taste changes. Xeloda was discontinued after 12 days and switched to IV 5-fluorouracil.   Patient is agreeable to Xeloda re-challenge as he awaits options for clinical trials.  Counseled patient on administration, dosing, side effects, monitoring, drug-food interactions, safe handling, storage, and disposal.  Xeloda will be initiated at lower dose on a 7 days on, 7 days off schedule, and then up-titrated based on tolerance.  Week 1: Patient will take Xeloda 500mg  tablets, 3 tablets (1500mg ) by mouth in AM and 3 tabs (1500mg ) by mouth in PM, within 30 minutes of finishing meals.   Week 2: off  Week 3: Patient will take Xeloda 500mg  tablets, 4 tablets (2000mg ) by mouth in AM and 4 tabs (2000mg ) by mouth in PM.   Week 4: off  Target dosing of Xeloda is 2000mg  BID for 1 week on, 1 week off and repeated.  Avastin will continue to be infused on day 1 of each 21 day cycle.  Xeloda start date: TBD, pending medication acquisition, likely 04/01/18 Next Avastin is scheduled for 04/04/17  Adverse effects include but are not limited to: fatigue, decreased blood counts, GI upset, diarrhea, and hand-foot syndrome.  We discussed side effects experienced during last use of Xeloda. Patient counseled to continue good oral hygiene, avoid drinks and foods of extreme temperature on either side, avoid acidic foods and drinks, and avoid spicy foods in an effort to reduce risk of mouth sores.  Patient has anti-emetic on  hand and knows to take it if nausea develops.    Patient states he is having to manage low level nausea and decrease in appetite consistently. We discussed alternative antinausea agents of olanzapine and dronabinol. Patient will alert the office when he is agreeable to progressing to either of these 2 agents.  Patient will obtain anti diarrheal and alert the office of 4 or more loose stools above baseline.  Reviewed with patient importance of keeping a medication schedule and plan for any missed doses.  Mr. Morgenthaler voiced understanding and appreciation.   All questions answered. Medication reconciliation performed and medication/allergy list updated.  Xeloda must be dispensed from [facility] per insurance requirement. Patient informed about dispensing pharmacy and provided phone number of [phone].  Patient knows to call the office with questions or concerns. Oral Oncology Clinic will continue to follow.  Johny Drilling, PharmD, BCPS, BCOP  03/27/2018   12:24 PM Oral Oncology Clinic [phone]

## 2018-04-03 NOTE — Telephone Encounter (Signed)
Oral Oncology Patient Advocate Encounter  I confirmed with [facility] that the Xeloda was delivered today 04/03/18.   [place] Patient [place] Phone [phone] Fax [phone]

## 2018-04-04 ENCOUNTER — Inpatient Hospital Stay: Payer: BLUE CROSS/BLUE SHIELD

## 2018-04-04 VITALS — BP 139/98 | HR 73 | Temp 97.2°F | Resp 18

## 2018-04-04 DIAGNOSIS — C189 Malignant neoplasm of colon, unspecified: Secondary | ICD-10-CM

## 2018-04-04 DIAGNOSIS — C787 Secondary malignant neoplasm of liver and intrahepatic bile duct: Principal | ICD-10-CM

## 2018-04-04 DIAGNOSIS — Z95828 Presence of other vascular implants and grafts: Secondary | ICD-10-CM

## 2018-04-04 DIAGNOSIS — D500 Iron deficiency anemia secondary to blood loss (chronic): Secondary | ICD-10-CM

## 2018-04-04 DIAGNOSIS — C182 Malignant neoplasm of ascending colon: Secondary | ICD-10-CM | POA: Diagnosis not present

## 2018-04-04 LAB — CBC WITH DIFFERENTIAL/PLATELET
Abs Immature Granulocytes: 0 10*3/uL (ref 0.00–0.07)
BASOS ABS: 0 10*3/uL (ref 0.0–0.1)
BASOS PCT: 1 %
EOS ABS: 0.1 10*3/uL (ref 0.0–0.5)
EOS PCT: 2 %
HCT: 40.7 % (ref 39.0–52.0)
Hemoglobin: 13.7 g/dL (ref 13.0–17.0)
Immature Granulocytes: 0 %
Lymphocytes Relative: 20 %
Lymphs Abs: 0.6 10*3/uL — ABNORMAL LOW (ref 0.7–4.0)
MCH: 33.3 pg (ref 26.0–34.0)
MCHC: 33.7 g/dL (ref 30.0–36.0)
MCV: 99 fL (ref 80.0–100.0)
Monocytes Absolute: 0.6 10*3/uL (ref 0.1–1.0)
Monocytes Relative: 19 %
NRBC: 0 % (ref 0.0–0.2)
Neutro Abs: 1.8 10*3/uL (ref 1.7–7.7)
Neutrophils Relative %: 58 %
Platelets: 132 10*3/uL — ABNORMAL LOW (ref 150–400)
RBC: 4.11 MIL/uL — AB (ref 4.22–5.81)
RDW: 14.8 % (ref 11.5–15.5)
WBC: 3.1 10*3/uL — AB (ref 4.0–10.5)

## 2018-04-04 LAB — IRON AND TIBC
IRON: 99 ug/dL (ref 42–163)
Saturation Ratios: 27 % (ref 20–55)
TIBC: 364 ug/dL (ref 202–409)
UIBC: 265 ug/dL (ref 117–376)

## 2018-04-04 LAB — FERRITIN: FERRITIN: 114 ng/mL (ref 24–336)

## 2018-04-04 LAB — COMPREHENSIVE METABOLIC PANEL
ALK PHOS: 58 U/L (ref 38–126)
ALT: 27 U/L (ref 0–44)
AST: 28 U/L (ref 15–41)
Albumin: 4 g/dL (ref 3.5–5.0)
Anion gap: 10 (ref 5–15)
BUN: 9 mg/dL (ref 6–20)
CALCIUM: 9.4 mg/dL (ref 8.9–10.3)
CHLORIDE: 106 mmol/L (ref 98–111)
CO2: 25 mmol/L (ref 22–32)
CREATININE: 1.08 mg/dL (ref 0.61–1.24)
Glucose, Bld: 92 mg/dL (ref 70–99)
Potassium: 4.8 mmol/L (ref 3.5–5.1)
Sodium: 141 mmol/L (ref 135–145)
TOTAL PROTEIN: 7.2 g/dL (ref 6.5–8.1)
Total Bilirubin: 0.7 mg/dL (ref 0.3–1.2)

## 2018-04-04 LAB — CEA (IN HOUSE-CHCC): CEA (CHCC-IN HOUSE): 13.9 ng/mL — AB (ref 0.00–5.00)

## 2018-04-04 LAB — TOTAL PROTEIN, URINE DIPSTICK: Protein, ur: NEGATIVE mg/dL

## 2018-04-04 MED ORDER — SODIUM CHLORIDE 0.9 % IV SOLN
Freq: Once | INTRAVENOUS | Status: AC
  Administered 2018-04-04: 09:00:00 via INTRAVENOUS
  Filled 2018-04-04: qty 250

## 2018-04-04 MED ORDER — SODIUM CHLORIDE 0.9% FLUSH
10.0000 mL | Freq: Once | INTRAVENOUS | Status: AC
  Administered 2018-04-04: 10 mL
  Filled 2018-04-04: qty 10

## 2018-04-04 MED ORDER — HEPARIN SOD (PORK) LOCK FLUSH 100 UNIT/ML IV SOLN
500.0000 [IU] | Freq: Once | INTRAVENOUS | Status: AC | PRN
  Administered 2018-04-04: 500 [IU]
  Filled 2018-04-04: qty 5

## 2018-04-04 MED ORDER — SODIUM CHLORIDE 0.9% FLUSH
10.0000 mL | INTRAVENOUS | Status: DC | PRN
  Administered 2018-04-04: 10 mL
  Filled 2018-04-04: qty 10

## 2018-04-04 MED ORDER — SODIUM CHLORIDE 0.9 % IV SOLN
500.0000 mg | Freq: Once | INTRAVENOUS | Status: AC
  Administered 2018-04-04: 500 mg via INTRAVENOUS
  Filled 2018-04-04: qty 4

## 2018-04-04 NOTE — Patient Instructions (Signed)
[facility] Discharge Instructions for Patients Receiving Chemotherapy  Today you received the following chemotherapy agent: Avastin.  To help prevent nausea and vomiting after your treatment, we encourage you to take your nausea medication as directed.   If you develop nausea and vomiting that is not controlled by your nausea medication, call the clinic.   BELOW ARE SYMPTOMS THAT SHOULD BE REPORTED IMMEDIATELY:  *FEVER GREATER THAN 100.5 F  *CHILLS WITH OR WITHOUT FEVER  NAUSEA AND VOMITING THAT IS NOT CONTROLLED WITH YOUR NAUSEA MEDICATION  *UNUSUAL SHORTNESS OF BREATH  *UNUSUAL BRUISING OR BLEEDING  TENDERNESS IN MOUTH AND THROAT WITH OR WITHOUT PRESENCE OF ULCERS  *URINARY PROBLEMS  *BOWEL PROBLEMS  UNUSUAL RASH Items with * indicate a potential emergency and should be followed up as soon as possible.  Feel free to call the clinic should you have any questions or concerns. The clinic phone number is [phone].  Please show the CHEMO ALERT CARD at check-in to the Emergency Department and triage nurse.   

## 2018-04-06 ENCOUNTER — Inpatient Hospital Stay: Payer: BLUE CROSS/BLUE SHIELD

## 2018-04-08 ENCOUNTER — Telehealth: Payer: Self-pay

## 2018-04-08 NOTE — Telephone Encounter (Signed)
Spoke with patient regarding his cycle of Xeloda, he started a new cycle yesterday, confirmed upcoming appointments.

## 2018-04-17 NOTE — Progress Notes (Signed)
[place]  [phone] [phone]  Clinic Follow up Note   Patient Care Team: Curlene Labrum, MD as PCP - General (Family Medicine) 04/18/2018  SUMMARY OF ONCOLOGIC HISTORY: Oncology History   Cancer Staging Metastatic colon cancer to liver [facility]) Staging form: Colon and Rectum, AJCC 8th Edition - Clinical stage from 06/01/2016: Stage IVA (cTX, cNX, pM1a) - Signed by Truitt Merle, MD on 07/04/2016 - Pathologic stage from 06/14/2016: Stage IVA (pT4b(m), pN2b, pM1a) - Signed by Truitt Merle, MD on 07/04/2016       Metastatic colon cancer to liver ([place])   04/2015 Procedure    Colonoscopy by Dr. Ladona Horns. It showed showed 2 sessile polyps ready between 3-5 mm in size located 20 cm (A, B) from the point of entry, polypectomy was performed. Pedunculated polyp was found in the ascending colon (C), polypectomy was performed, and additional polyp (D) was found 30 cm from the point of entry, removed    04/2015 Pathology Results    tubular adenoma (A and B), and well differentiated adenocarcinoma arising from tubulovillous adenoma (C) and well differentiated adenocarcinoma arising from severe dysplasia to intramucosal carcinoma within tubular adenoma.     04/2015 Initial Diagnosis    Metastatic colon cancer to liver ([place])    05/29/2016 Imaging    CT abdomen and pelvis with contrast showed an apple core like stricture in right colon just above the cecum, measuring 3.2 cm in lengths. This is highly suspicious for malignancy. Small lymph node a noticed he had adjacent mesentery, measuring 8 mm. There is a low-density lesion within the inferior aspect of the right hepatic lobe measuring 1.7 cm, suspicious for metastasis.     06/06/2016 Tumor Marker    CEA 9.99    06/08/2016 PET scan    IMPRESSION: Approximately 3 cm hypermetabolic mass in the ascending colon, consistent with primary colon carcinoma. This mass results in colonic obstruction and small bowel dilatation.  Additional areas of hypermetabolic wall thickening in the cecum may represent other sites of colon carcinoma or colitis. Mild hypermetabolic lymphadenopathy in right pericolonic region, porta hepatis, and aortocaval space, consistent with metastatic disease. Mild hypermetabolic mediastinal lymphadenopathy also seen, and thoracic lymph node metastases cannot be excluded. Solitary hypermetabolic focus in inferior right hepatic lobe, consistent with liver metastasis. Consider abdomen MRI without and with contrast for further evaluation.    06/14/2016 Surgery    Hand assisted right hemicolectomy and small bowel resection for colon cancer, liver biopsy, by Dr. Barry Dienes    06/14/2016 Pathology Results    Right hemicolectomy showed invasive well to moderately differentiated adenocarcinoma, 2 foci measuring 7.5 cm and 4.5 cm, tumor invades through full thickness of colon, to the seroma and involve the Small Bowel, Surgical Margins Were Negative, 24 Out Of 64 Lymph Nodes Were Positive, Extracapsular Extension Identified, Multiple Satellite Tumor Deposits Present, Liver Biopsy Showed Metastatic Adenocarcinoma.      06/14/2016 Miscellaneous    Tumor MMR normal, MSI stable     06/14/2016 Miscellaneous    Foundation one genomic testing showed K-ras G12 D mutation, APC and TP53 mutation. No BRAF and NRAS mutation. MSI-stable, tumor burden low.    07/06/2016 Tumor Marker    CEA 13.69    07/13/2016 - 01/18/2017 Chemotherapy    mFOLFOX, every 2 weeks, started on 07/14/2015, Avastin added from cycle 3  Oxaliplatin dose to 40m/m2 due to side effects and some cytopenia on 09/21/16  Changed to FOLFIRINOX starting cycle 7 and Reduced  Due to  neuropathy hold Oxaliplatin and add Irinotecan with neulasta on day 3 starting with cycle 7 Add low dose Oxaliplatin with cycle 8.  Due to his worsening neuropathy, and good response to chemotherapy, I previously stopped oxaliplatin from cycle 11, and continue FOLFIRI and avastin        07/27/2016 Tumor Marker    CEA 15.85    09/04/2016 Imaging    Ct C/A/P W Contrast IMPRESSION: Interval right colectomy. Stable small liver metastasis in the inferior right hepatic lobe. Stable mild porta hepatis and aortocaval lymphadenopathy. Stable mild mediastinal lymphadenopathy. No new or progressive metastatic disease identified within the chest, abdomen, or pelvis.    12/19/2016 PET scan    IMPRESSION: 1. Right hemicolectomy, with resolution of the prior hypermetabolic activity inferiorly in the right hepatic lobe, in several mediastinal lymph nodes, and in lymph nodes in the retroperitoneum and porta hepatis. No residual hypermetabolic or enlarged lymph nodes are identified. 2. Low-grade diffuse skeletal metabolic activity is likely therapy related. 3. Coronary atherosclerosis. 4. 3 by 4 mm right middle lobe pulmonary nodule is stable, not appreciably hypermetabolic, but below sensitive PET-CT size thresholds. This may warrant surveillance.    01/11/2017 Imaging    MR Abdomen W WO Contrast IMPRESSION: 1. No acute findings within the abdomen. Previously noted liver metastasis has resolved in the interval. No new lesions.    02/01/2017 - 09/2017 Chemotherapy    Maintenance therapy, Xeloda '2000mg'$  (1000 mg/m2)  q12h on day 1-14 every 21 days plus AVASTIN, starting 02/01/2017.  stopped after 12 days due to poor tolerance on 02/14/17  Changed to maintenance 5-FU and avastin every 2 weeks starting on 02/22/17, stopped in 09/2017.       04/10/2017 Imaging    IMPRESSION: 1. Status post right hemicolectomy without findings for recurrent tumor. 2. No worrisome hepatic lesions. Treated disease with only a small residual low attenuation lesion in the right hepatic lobe. 3. No recurrent mediastinal or abdominal lymphadenopathy.     07/09/2017 PET scan    PET 07/09/17 IMPRESSION: 1. Status post right hemicolectomy, without findings of hypermetabolic recurrent or metastatic  disease. 2. A right middle lobe pulmonary nodule is unchanged. However, there is a right lower lobe 5 mm pulmonary nodule which is felt to be new and enlarged compared to prior exams. Suspicious for an isolated pulmonary metastasis. Consider CT follow-up at 3-6 months. 3. Age advanced coronary artery atherosclerosis. Recommend assessment of coronary risk factors and consideration of medical therapy. 4. Borderline ascending aortic dilatation, 4.0 cm      10/10/2017 Imaging    IMPRESSION: 1. Several (at least 10) subcentimeter pulmonary nodules scattered in both lungs, predominantly in the lower lobes, all new/increased, most compatible with enlarging pulmonary metastases, largest 8 mm in the right lower lobe. 2. No additional findings of new or progressive metastatic disease. No recurrent adenopathy. Stable small low-attenuation lesion in the inferior right liver lobe compatible with treated metastasis. No new liver metastases. 3. Stable ectatic 4.0 cm ascending thoracic aorta. Recommend annual imaging followup by CTA or MRA.  4. Stable mild splenomegaly.     10/11/2017 - 12/2017 Chemotherapy    FOLFIRI and Avastin every 2 weeks starting 10/11/17, irnotecan stopped in 12/2017 due to disease progression      01/08/2018 Imaging    01/08/2018 CT CAP IMPRESSION: 1. Progressive hepatic and pulmonary metastatic disease. 2. Ascending Aortic aneurysm NOS (ICD10-I71.9).    01/08/2018 Progression    01/08/2018 CT CAP IMPRESSION: 1. Progressive hepatic and pulmonary metastatic disease. 2.  Ascending Aortic aneurysm NOS (ICD10-I71.9).    01/10/2018 -  Chemotherapy    FOLFOX and Avastin every 2 weeks starting on 01/10/2018.     03/19/2018 Imaging    03/19/2018 CT CAP IMPRESSION: 1. Interval increase in size of dominant nodule within the right lower lobe. There are a few additional nodules which have increased in size. Multiple additional small bilateral pulmonary nodules are grossly similar. 2.  Slight interval increase in size of lesion within the right hepatic lobe. 3. Interval increase in soft tissue at the surgical anastomosis involving the small bowel within the central abdomen. Locally recurrent disease not excluded.    03/19/2018 Progression    03/19/2018 CT CAP IMPRESSION: 1. Interval increase in size of dominant nodule within the right lower lobe. There are a few additional nodules which have increased in size. Multiple additional small bilateral pulmonary nodules are grossly similar. 2. Slight interval increase in size of lesion within the right hepatic lobe. 3. Interval increase in soft tissue at the surgical anastomosis involving the small bowel within the central abdomen. Locally recurrent disease not excluded.     03/21/2018 -  Chemotherapy    The patient had bevacizumab (AVASTIN) 475 mg in sodium chloride 0.9 % 100 mL chemo infusion, 5 mg/kg = 475 mg, Intravenous,  Once, 3 of 6 cycles Administration: 475 mg (03/21/2018), 500 mg (04/04/2018)  for chemotherapy treatment.     CURRENT THERAPY:  Third line Xeloda and Avastin every 2 weeks   INTERVAL HISTORY: Nicholas Lowery returns for follow up as scheduled. He was last seen with [name] on 03/21/18. He was seen at [facility] by Dr. Truman Hayward for consideration of clinical trial; unfortunately there is not availability on the [id number]-GI-1618 trial at this time. He continues avastin q2 weeks, last dose 11/21. He began Xeloda re-challenge on 11/24, with 1500 mg BID for 1 week. He tolerated well for the most part, with increased pain and redness to his feet. This improved on his current week off but did not resolve. He denies increased neuropathy or redness in his hands. No functional or gait difficulties. He is not eating well, he attributes to more depression and emotional lability. He cries often. He will see mental health provider next week. He denies HI/SI. He has 2-3 loose BM per day, no blood in stool. Denies n/v. Denies mucositis.  Denies fever, chills, cough, chest pain, or dyspnea. He is not exercising regularly. He continues to use alcohol and marijuana to cope.    MEDICAL HISTORY:  Past Medical History:  Diagnosis Date  . Anxiety   . Cancer of ascending colon ([place])   . Seasonal allergies     SURGICAL HISTORY: Past Surgical History:  Procedure Laterality Date  . APPENDECTOMY  1992  . CATARACT EXTRACTION W/ INTRAOCULAR LENS IMPLANT Left 04/2016  . COLON SURGERY    . COLONOSCOPY W/ BIOPSIES AND POLYPECTOMY  04/2015  . INGUINAL HERNIA REPAIR Right 1982  . IR RADIOLOGIST EVAL & MGMT  01/16/2017  . KNEE ARTHROSCOPY W/ MENISCECTOMY Right 1998  . LAPAROSCOPIC RIGHT COLECTOMY N/A 06/14/2016   Procedure: LAPAROSCOPIC HAND ASSISTED HEMICOLECTOMY AND SMALL BOWEL RESECTION.;  Surgeon: [name], MD;  Location: [place];  Service: General;  Laterality: N/A;  . LIVER BIOPSY Right 06/14/2016   Procedure: LIVER BIOPSY;  Surgeon: [name], MD;  Location: [place];  Service: General;  Laterality: Right;  Right Inferior Liver  . PORTACATH PLACEMENT N/A 07/06/2016   Procedure: INSERTION PORT-A-CATH;  Surgeon: [name], MD;  Location: [place];  Service: General;  Laterality: N/A;  . VASECTOMY  2005    I have reviewed the social history and family history with the patient and they are unchanged from previous note.  ALLERGIES:  is allergic to penicillins and xeloda [capecitabine].  MEDICATIONS:  Current Outpatient Medications  Medication Sig Dispense Refill  . amLODipine (NORVASC) 10 MG tablet Take 1 tablet (10 mg total) by mouth daily. 30 tablet 5  . capecitabine (XELODA) 500 MG tablet Week1:Take 3 tab ('1500mg'$ ) PO BID, immediately after food, 7d on, 7d off. If tolerated, NUUV2: 4 tab ('2000mg'$ ) BID, after food, 7d on, 7d off 98 tablet 0  . celecoxib (CELEBREX) 200 MG capsule Take 1 capsule (200 mg total) by mouth daily. 60 capsule 1  . dexamethasone (DECADRON) 4 MG tablet UP TO THREE DAYS AFTER CHEMO (BEGIN  DAY TWO) 15 tablet 0  . diphenoxylate-atropine (LOMOTIL) 2.5-0.025 MG tablet Take 2 tablets by mouth 4 (four) times daily as needed for diarrhea or loose stools. 30 tablet 0  . lisinopril (PRINIVIL,ZESTRIL) 10 MG tablet Take 2 tablets (20 mg total) by mouth daily. 180 tablet 2  . LORazepam (ATIVAN) 0.5 MG tablet Take 1 tablet (0.5 mg total) by mouth every 8 (eight) hours as needed (for nausea). 30 tablet 0  . Multiple Vitamin (MULTIVITAMIN WITH MINERALS) TABS tablet Take 1 tablet by mouth daily.    Marland Kitchen omeprazole (PRILOSEC) 20 MG capsule Take 1 capsule (20 mg total) by mouth 2 (two) times daily before a meal. 60 capsule 2  . ondansetron (ZOFRAN ODT) 8 MG disintegrating tablet Take 1 tablet (8 mg total) by mouth every 8 (eight) hours as needed for nausea or vomiting. 30 tablet 2  . prochlorperazine (COMPAZINE) 10 MG tablet Take 1 tablet (10 mg total) by mouth every 6 (six) hours as needed for nausea or vomiting. 30 tablet 3  . traMADol (ULTRAM) 50 MG tablet Take 1 tablet (50 mg total) by mouth every 6 (six) hours as needed. 30 tablet 0  . urea (CARMOL) 10 % cream Apply topically 3 (three) times daily. 71 g 1  . venlafaxine XR (EFFEXOR XR) 150 MG 24 hr capsule Take 1 capsule (150 mg total) by mouth daily with breakfast. 90 capsule 1   No current facility-administered medications for this visit.    Facility-Administered Medications Ordered in Other Visits  Medication Dose Route Frequency Provider Last Rate Last Dose  . bevacizumab (AVASTIN) 500 mg in sodium chloride 0.9 % 100 mL chemo infusion  5.3 mg/kg (Treatment Plan Recorded) Intravenous Once Truitt Merle, MD      . clindamycin (CLEOCIN) 900 mg in dextrose 5 % 50 mL IVPB  900 mg Intravenous 60 min Pre-Op [name], MD       And  . gentamicin (GARAMYCIN) 450 mg in dextrose 5 % 50 mL IVPB  5 mg/kg Intravenous 60 min Pre-Op [name], MD      . dexamethasone (DECADRON) injection 10 mg  10 mg Intravenous Once Harle Stanford., PA-C      .  diphenoxylate-atropine (LOMOTIL) 2.5-0.025 MG per tablet 2 tablet  2 tablet Oral Once Harle Stanford., PA-C      . heparin lock flush 100 unit/mL  500 Units Intracatheter Once PRN Truitt Merle, MD      . sodium chloride flush (NS) 0.9 % injection 10 mL  10 mL Intracatheter Once Truitt Merle, MD      . sodium chloride flush (NS) 0.9 % injection  10 mL  10 mL Intracatheter PRN Truitt Merle, MD        PHYSICAL EXAMINATION: ECOG PERFORMANCE STATUS: 1 - Symptomatic but completely ambulatory  Vitals:   04/18/18 1107 04/18/18 1209  BP: (!) 148/103 (!) 150/102  Pulse:    Resp:    Temp:    SpO2:     Filed Weights   04/18/18 1104  Weight: 205 lb 14.4 oz (93.4 kg)    GENERAL:alert, no distress and comfortable SKIN:  no rashes or significant lesions. No palmar erythema, blisters, or skin breakdown EYES: sclera clear OROPHARYNX:no thrush or ulcers LYMPH:  no palpable cervical or supraclavicular lymphadenopathy LUNGS: clear to auscultation with normal breathing effort HEART: regular rate & rhythm, no lower extremity edema ABDOMEN:abdomen soft, non-tender and normal bowel sounds NEURO: alert & oriented x 3 with fluent speech, no focal motor deficits PAC without erythema   LABORATORY DATA:  I have reviewed the data as listed CBC Latest Ref Rng & Units 04/18/2018 04/04/2018 03/21/2018  WBC 4.0 - 10.5 K/uL 4.3 3.1(L) 4.0  Hemoglobin 13.0 - 17.0 g/dL 13.2 13.7 12.8(L)  Hematocrit 39.0 - 52.0 % 40.0 40.7 38.5(L)  Platelets 150 - 400 K/uL 173 132(L) 126(L)     CMP Latest Ref Rng & Units 04/18/2018 04/04/2018 03/21/2018  Glucose 70 - 99 mg/dL 123(H) 92 106(H)  BUN 6 - 20 mg/dL _0 Creatinine 0.61 - 1.24 mg/dL 1.21 1.08 1.03  Sodium 135 - 145 mmol/L 140 141 138  Potassium 3.5 - 5.1 mmol/L 4.8 4.8 4.5  Chloride 98 - 111 mmol/L 108 106 104  CO2 22 - 32 mmol/L _1 Calcium 8.9 - 10.3 mg/dL 9.6 9.4 8.8(L)  Total Protein 6.5 - 8.1 g/dL 7.2 7.2 6.9  Total Bilirubin 0.3 - 1.2 mg/dL 0.5 0.7 0.4    Alkaline Phos 38 - 126 U/L 61 58 56  AST 15 - 41 U/L _2 ALT 0 - 44 U/L _3 RADIOGRAPHIC STUDIES: I have personally reviewed the radiological images as listed and agreed with the findings in the report. No results found.   ASSESSMENT & PLAN: Nicholas Lowery a 57 y.o.Caucasian male, without significant past medical history, presented with rectal bleeding in December 2016, colonoscopy showed 4 polyps, 2 of them showed invasive adenocarcinoma, unfortunately he was not aware of the pathology findings and was not treated. He now presented with fatigue, weight loss, anorexia and abdominal pain  1. Right colon cancer with liver, node and probable lung metastasis, [id number], stage IV, MSI-stable, KRAS G12D mutation (+) -Nicholas Lowery appears stable. He tolerated first cycle dose-reduced Xeloda re-challenge at 1500 mg BID 7 days on/7 days off moderately well. He has decreased appetite and increased neuropathy and redness to feet, but improved on his week off. He remains functional without gait imbalance, no fall. He has loose BM, which he attributes to his diet choices.  -I encouraged him to use urea cream PRN for hand/foot syndrome -Labs reviewed, CBC and CMP stable; adequate to continue avastin and start cycle 2 Xeloda at full dose 2000 mg BID 1 week on/1 week off on 12/8 -I encouraged him to eat better and remain adequately hydrated  -Given his previous intolerance to Xeloda, I suggest f/u in 2 weeks to evaluate his response to full dose, then can f/u monthly if he tolerates well.  -Per Dr. Truman Hayward, there is not availability on the [id number]-GI-1618 trial at this time, but perhaps  in late January; he will remain in contact with [facility]. -Proceed with Avastin today, continue q2 weeks   2. Depression, Anxiety  -Pt has long-standing history of depression, has been on Prozac for decades, it was very well controlled in the past.  -He has become more depressed lately, due to the stress  from cancer diagnosis, especially stage IV, with prolonged chemotherapy, stress from work and family responsibility, etc.  -coping with increased alcohol consumption and marijuana use, encouraged to decrease drinking -continue f/u with Dr. Cheryln Manly, next visit ~12/9 -I encouraged him to talk openly with his family and children, he is doing so.  3. Hypertension  -We previously discussed Avastin can cause hypertension -Continue lisinopril and Amlodipine.  -Elevated today, likely compounded by stress, lack of exercise, alcohol, and irregular eating patterns; we reviewed lifestyle modifications.  -OK to proceed with avastin today   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 -Hgb 13.2 today, iron studies are adequate, no need for additional IV Feraheme   5. Anorexia and weight loss secondary to chemo  -Secondary to underlying malignancy -he attributes decreased appetite to worsening depression lately   6. Peripheral neuropathy, G1  -Secondary to oxaliplatin, dose reduced and ultimately discontinued in 11/2016 -neuropathy has increased again since restarting oxaliplatin in 12/2017 -dose reduced from 60 mg/m2 to 48 mg/m2 on 10/10 -he has no significant functional limitations, but has previously noted moderately diminished vibratory sense to feet per tuning fork exam -neuropathy and foot pain is worse with Xeloda re-challenge, but improved on week off. He functions well. Will monitor.   7. Goal of care discussion  -The patient understands the goal of care is palliative.  -He is full code now   8. Left axillary cyst -shave biopsy on 02/27/18 shows inflamed epidermoid cyst  PLAN: -Labs reviewed -Proceed with avastin today, continue q2 weeks -Restart Xeloda 12/8 at full dose 2000 mg BID 1 week on/1 week off -F/u in 2 weeks to evaluate tolerance to full dose  -F/u Dr. Cheryln Manly next week for ongoing mental health    -Lifestyle modifications   All questions were answered. The patient knows to call the clinic with any problems, questions or concerns. No barriers to learning was detected. I spent 20 minutes counseling the patient face to face. The total time spent in the appointment was 25 minutes and more than 50% was on counseling and review of test results     Alla Feeling, NP 04/18/18

## 2018-04-18 ENCOUNTER — Inpatient Hospital Stay: Payer: BLUE CROSS/BLUE SHIELD

## 2018-04-18 ENCOUNTER — Inpatient Hospital Stay: Payer: BLUE CROSS/BLUE SHIELD | Attending: Hematology

## 2018-04-18 ENCOUNTER — Encounter: Payer: Self-pay | Admitting: Nurse Practitioner

## 2018-04-18 ENCOUNTER — Inpatient Hospital Stay (HOSPITAL_BASED_OUTPATIENT_CLINIC_OR_DEPARTMENT_OTHER): Payer: BLUE CROSS/BLUE SHIELD | Admitting: Nurse Practitioner

## 2018-04-18 VITALS — BP 150/102 | HR 85 | Temp 98.0°F | Resp 18 | Ht 70.0 in | Wt 205.9 lb

## 2018-04-18 VITALS — BP 155/109 | HR 86

## 2018-04-18 DIAGNOSIS — G4700 Insomnia, unspecified: Secondary | ICD-10-CM | POA: Insufficient documentation

## 2018-04-18 DIAGNOSIS — C7800 Secondary malignant neoplasm of unspecified lung: Secondary | ICD-10-CM | POA: Insufficient documentation

## 2018-04-18 DIAGNOSIS — Z79899 Other long term (current) drug therapy: Secondary | ICD-10-CM | POA: Insufficient documentation

## 2018-04-18 DIAGNOSIS — F1010 Alcohol abuse, uncomplicated: Secondary | ICD-10-CM | POA: Diagnosis not present

## 2018-04-18 DIAGNOSIS — Z5111 Encounter for antineoplastic chemotherapy: Secondary | ICD-10-CM | POA: Insufficient documentation

## 2018-04-18 DIAGNOSIS — K047 Periapical abscess without sinus: Secondary | ICD-10-CM | POA: Diagnosis not present

## 2018-04-18 DIAGNOSIS — Z87891 Personal history of nicotine dependence: Secondary | ICD-10-CM | POA: Insufficient documentation

## 2018-04-18 DIAGNOSIS — G629 Polyneuropathy, unspecified: Secondary | ICD-10-CM | POA: Insufficient documentation

## 2018-04-18 DIAGNOSIS — I2510 Atherosclerotic heart disease of native coronary artery without angina pectoris: Secondary | ICD-10-CM

## 2018-04-18 DIAGNOSIS — I10 Essential (primary) hypertension: Secondary | ICD-10-CM | POA: Insufficient documentation

## 2018-04-18 DIAGNOSIS — D509 Iron deficiency anemia, unspecified: Secondary | ICD-10-CM | POA: Diagnosis not present

## 2018-04-18 DIAGNOSIS — C787 Secondary malignant neoplasm of liver and intrahepatic bile duct: Secondary | ICD-10-CM | POA: Diagnosis not present

## 2018-04-18 DIAGNOSIS — Z9049 Acquired absence of other specified parts of digestive tract: Secondary | ICD-10-CM | POA: Diagnosis not present

## 2018-04-18 DIAGNOSIS — F329 Major depressive disorder, single episode, unspecified: Secondary | ICD-10-CM | POA: Insufficient documentation

## 2018-04-18 DIAGNOSIS — F419 Anxiety disorder, unspecified: Secondary | ICD-10-CM | POA: Insufficient documentation

## 2018-04-18 DIAGNOSIS — Z95828 Presence of other vascular implants and grafts: Secondary | ICD-10-CM

## 2018-04-18 DIAGNOSIS — F1290 Cannabis use, unspecified, uncomplicated: Secondary | ICD-10-CM

## 2018-04-18 DIAGNOSIS — R030 Elevated blood-pressure reading, without diagnosis of hypertension: Secondary | ICD-10-CM

## 2018-04-18 DIAGNOSIS — C182 Malignant neoplasm of ascending colon: Secondary | ICD-10-CM | POA: Diagnosis present

## 2018-04-18 DIAGNOSIS — C189 Malignant neoplasm of colon, unspecified: Secondary | ICD-10-CM

## 2018-04-18 LAB — CBC WITH DIFFERENTIAL/PLATELET
Abs Immature Granulocytes: 0.01 10*3/uL (ref 0.00–0.07)
Basophils Absolute: 0 10*3/uL (ref 0.0–0.1)
Basophils Relative: 1 %
EOS ABS: 0.1 10*3/uL (ref 0.0–0.5)
EOS PCT: 1 %
HCT: 40 % (ref 39.0–52.0)
Hemoglobin: 13.2 g/dL (ref 13.0–17.0)
Immature Granulocytes: 0 %
Lymphocytes Relative: 19 %
Lymphs Abs: 0.8 10*3/uL (ref 0.7–4.0)
MCH: 33.3 pg (ref 26.0–34.0)
MCHC: 33 g/dL (ref 30.0–36.0)
MCV: 101 fL — AB (ref 80.0–100.0)
MONO ABS: 0.6 10*3/uL (ref 0.1–1.0)
MONOS PCT: 14 %
NEUTROS PCT: 65 %
Neutro Abs: 2.8 10*3/uL (ref 1.7–7.7)
PLATELETS: 173 10*3/uL (ref 150–400)
RBC: 3.96 MIL/uL — ABNORMAL LOW (ref 4.22–5.81)
RDW: 15.7 % — AB (ref 11.5–15.5)
WBC: 4.3 10*3/uL (ref 4.0–10.5)
nRBC: 0 % (ref 0.0–0.2)

## 2018-04-18 LAB — COMPREHENSIVE METABOLIC PANEL
ALT: 17 U/L (ref 0–44)
ANION GAP: 9 (ref 5–15)
AST: 23 U/L (ref 15–41)
Albumin: 4.1 g/dL (ref 3.5–5.0)
Alkaline Phosphatase: 61 U/L (ref 38–126)
BILIRUBIN TOTAL: 0.5 mg/dL (ref 0.3–1.2)
BUN: 13 mg/dL (ref 6–20)
CHLORIDE: 108 mmol/L (ref 98–111)
CO2: 23 mmol/L (ref 22–32)
Calcium: 9.6 mg/dL (ref 8.9–10.3)
Creatinine, Ser: 1.21 mg/dL (ref 0.61–1.24)
Glucose, Bld: 123 mg/dL — ABNORMAL HIGH (ref 70–99)
POTASSIUM: 4.8 mmol/L (ref 3.5–5.1)
Sodium: 140 mmol/L (ref 135–145)
TOTAL PROTEIN: 7.2 g/dL (ref 6.5–8.1)

## 2018-04-18 LAB — TOTAL PROTEIN, URINE DIPSTICK: PROTEIN: NEGATIVE mg/dL

## 2018-04-18 MED ORDER — HEPARIN SOD (PORK) LOCK FLUSH 100 UNIT/ML IV SOLN
500.0000 [IU] | Freq: Once | INTRAVENOUS | Status: AC | PRN
  Administered 2018-04-18: 500 [IU]
  Filled 2018-04-18: qty 5

## 2018-04-18 MED ORDER — SODIUM CHLORIDE 0.9 % IV SOLN
Freq: Once | INTRAVENOUS | Status: AC
  Administered 2018-04-18: 13:00:00 via INTRAVENOUS
  Filled 2018-04-18: qty 250

## 2018-04-18 MED ORDER — SODIUM CHLORIDE 0.9% FLUSH
10.0000 mL | Freq: Once | INTRAVENOUS | Status: AC
  Administered 2018-04-18: 10 mL
  Filled 2018-04-18: qty 10

## 2018-04-18 MED ORDER — SODIUM CHLORIDE 0.9 % IV SOLN
5.3000 mg/kg | Freq: Once | INTRAVENOUS | Status: AC
  Administered 2018-04-18: 500 mg via INTRAVENOUS
  Filled 2018-04-18: qty 16

## 2018-04-18 MED ORDER — SODIUM CHLORIDE 0.9% FLUSH
10.0000 mL | INTRAVENOUS | Status: DC | PRN
  Administered 2018-04-18: 10 mL
  Filled 2018-04-18: qty 10

## 2018-04-18 NOTE — Progress Notes (Signed)
Per Regan Rakers , it is okay to treat pt today with avastin and BP from today.

## 2018-04-18 NOTE — Patient Instructions (Signed)
[facility] Discharge Instructions for Patients Receiving Chemotherapy  Today you received the following chemotherapy agent: Avastin.  To help prevent nausea and vomiting after your treatment, we encourage you to take your nausea medication as directed.   If you develop nausea and vomiting that is not controlled by your nausea medication, call the clinic.   BELOW ARE SYMPTOMS THAT SHOULD BE REPORTED IMMEDIATELY:  *FEVER GREATER THAN 100.5 F  *CHILLS WITH OR WITHOUT FEVER  NAUSEA AND VOMITING THAT IS NOT CONTROLLED WITH YOUR NAUSEA MEDICATION  *UNUSUAL SHORTNESS OF BREATH  *UNUSUAL BRUISING OR BLEEDING  TENDERNESS IN MOUTH AND THROAT WITH OR WITHOUT PRESENCE OF ULCERS  *URINARY PROBLEMS  *BOWEL PROBLEMS  UNUSUAL RASH Items with * indicate a potential emergency and should be followed up as soon as possible.  Feel free to call the clinic should you have any questions or concerns. The clinic phone number is [phone].  Please show the CHEMO ALERT CARD at check-in to the Emergency Department and triage nurse.   

## 2018-04-18 NOTE — Progress Notes (Signed)
Per Cira Rue, NP ok to treat with avastin today despite hypertension

## 2018-04-22 ENCOUNTER — Ambulatory Visit (INDEPENDENT_AMBULATORY_CARE_PROVIDER_SITE_OTHER): Payer: BLUE CROSS/BLUE SHIELD | Admitting: Psychology

## 2018-04-22 ENCOUNTER — Other Ambulatory Visit: Payer: Self-pay | Admitting: Hematology

## 2018-04-22 DIAGNOSIS — F4324 Adjustment disorder with disturbance of conduct: Secondary | ICD-10-CM

## 2018-04-23 ENCOUNTER — Other Ambulatory Visit: Payer: Self-pay | Admitting: Hematology

## 2018-04-23 DIAGNOSIS — C787 Secondary malignant neoplasm of liver and intrahepatic bile duct: Principal | ICD-10-CM

## 2018-04-23 DIAGNOSIS — C189 Malignant neoplasm of colon, unspecified: Secondary | ICD-10-CM

## 2018-04-25 ENCOUNTER — Other Ambulatory Visit: Payer: Self-pay | Admitting: Hematology

## 2018-04-26 ENCOUNTER — Other Ambulatory Visit: Payer: Self-pay | Admitting: Hematology

## 2018-04-26 MED ORDER — TRAMADOL HCL 50 MG PO TABS: 50.0000 mg | ORAL_TABLET | Freq: Four times a day (QID) | ORAL | 1 refills | Status: DC | PRN

## 2018-05-02 ENCOUNTER — Inpatient Hospital Stay: Payer: BLUE CROSS/BLUE SHIELD

## 2018-05-02 ENCOUNTER — Other Ambulatory Visit: Payer: Self-pay | Admitting: Medical

## 2018-05-02 ENCOUNTER — Inpatient Hospital Stay (HOSPITAL_BASED_OUTPATIENT_CLINIC_OR_DEPARTMENT_OTHER): Payer: BLUE CROSS/BLUE SHIELD | Admitting: Medical

## 2018-05-02 ENCOUNTER — Other Ambulatory Visit: Payer: Self-pay

## 2018-05-02 VITALS — BP 133/91 | HR 76 | Temp 97.9°F | Resp 16

## 2018-05-02 DIAGNOSIS — C182 Malignant neoplasm of ascending colon: Secondary | ICD-10-CM | POA: Diagnosis not present

## 2018-05-02 DIAGNOSIS — Z95828 Presence of other vascular implants and grafts: Secondary | ICD-10-CM

## 2018-05-02 DIAGNOSIS — K047 Periapical abscess without sinus: Secondary | ICD-10-CM

## 2018-05-02 DIAGNOSIS — Z79899 Other long term (current) drug therapy: Secondary | ICD-10-CM

## 2018-05-02 DIAGNOSIS — C189 Malignant neoplasm of colon, unspecified: Secondary | ICD-10-CM

## 2018-05-02 DIAGNOSIS — C787 Secondary malignant neoplasm of liver and intrahepatic bile duct: Principal | ICD-10-CM

## 2018-05-02 DIAGNOSIS — D500 Iron deficiency anemia secondary to blood loss (chronic): Secondary | ICD-10-CM

## 2018-05-02 DIAGNOSIS — C7800 Secondary malignant neoplasm of unspecified lung: Secondary | ICD-10-CM

## 2018-05-02 LAB — COMPREHENSIVE METABOLIC PANEL
ALT: 17 U/L (ref 0–44)
AST: 23 U/L (ref 15–41)
Albumin: 4.2 g/dL (ref 3.5–5.0)
Alkaline Phosphatase: 65 U/L (ref 38–126)
Anion gap: 10 (ref 5–15)
BUN: 10 mg/dL (ref 6–20)
CO2: 24 mmol/L (ref 22–32)
Calcium: 9.3 mg/dL (ref 8.9–10.3)
Chloride: 105 mmol/L (ref 98–111)
Creatinine, Ser: 1.1 mg/dL (ref 0.61–1.24)
GFR calc Af Amer: >60 mL/min (ref >60)
Glucose, Bld: 107 mg/dL — ABNORMAL HIGH (ref 70–99)
Potassium: 4.7 mmol/L (ref 3.5–5.1)
Sodium: 139 mmol/L (ref 135–145)
Total Bilirubin: 0.7 mg/dL (ref 0.3–1.2)
Total Protein: 7.2 g/dL (ref 6.5–8.1)

## 2018-05-02 LAB — CBC WITH DIFFERENTIAL/PLATELET
Abs Immature Granulocytes: 0.01 10*3/uL (ref 0.00–0.07)
Basophils Absolute: 0 10*3/uL (ref 0.0–0.1)
Basophils Relative: 1 %
Eosinophils Absolute: 0.1 10*3/uL (ref 0.0–0.5)
Eosinophils Relative: 3 %
HCT: 40 % (ref 39.0–52.0)
Hemoglobin: 13.3 g/dL (ref 13.0–17.0)
Immature Granulocytes: 0 %
Lymphocytes Relative: 18 %
Lymphs Abs: 0.6 10*3/uL — ABNORMAL LOW (ref 0.7–4.0)
MCH: 33 pg (ref 26.0–34.0)
MCHC: 33.3 g/dL (ref 30.0–36.0)
MCV: 99.3 fL (ref 80.0–100.0)
MONOS PCT: 16 %
Monocytes Absolute: 0.6 10*3/uL (ref 0.1–1.0)
Neutro Abs: 2.2 10*3/uL (ref 1.7–7.7)
Neutrophils Relative %: 62 %
Platelets: 176 10*3/uL (ref 150–400)
RBC: 4.03 MIL/uL — ABNORMAL LOW (ref 4.22–5.81)
RDW: 16.3 % — ABNORMAL HIGH (ref 11.5–15.5)
WBC: 3.6 10*3/uL — ABNORMAL LOW (ref 4.0–10.5)
nRBC: 0 % (ref 0.0–0.2)

## 2018-05-02 LAB — IRON AND TIBC
Iron: 98 ug/dL (ref 42–163)
Saturation Ratios: 28 % (ref 20–55)
TIBC: 351 ug/dL (ref 202–409)
UIBC: 253 ug/dL (ref 117–376)

## 2018-05-02 LAB — FERRITIN: FERRITIN: 183 ng/mL (ref 24–336)

## 2018-05-02 LAB — CEA (IN HOUSE-CHCC): CEA (CHCC-In House): 15.25 ng/mL — ABNORMAL HIGH (ref 0.00–5.00)

## 2018-05-02 LAB — TOTAL PROTEIN, URINE DIPSTICK: Protein, ur: NEGATIVE mg/dL

## 2018-05-02 MED ORDER — SODIUM CHLORIDE 0.9 % IV SOLN
5.3000 mg/kg | Freq: Once | INTRAVENOUS | Status: AC
  Administered 2018-05-02: 500 mg via INTRAVENOUS
  Filled 2018-05-02: qty 4

## 2018-05-02 MED ORDER — LEVOFLOXACIN 750 MG PO TABS: 750.0000 mg | ORAL_TABLET | Freq: Every day | ORAL | 0 refills | Status: DC

## 2018-05-02 MED ORDER — METRONIDAZOLE 500 MG PO TABS: 500.0000 mg | ORAL_TABLET | Freq: Three times a day (TID) | ORAL | 0 refills | Status: DC

## 2018-05-02 MED ORDER — SODIUM CHLORIDE 0.9% FLUSH
10.0000 mL | INTRAVENOUS | Status: DC | PRN
  Administered 2018-05-02: 10 mL
  Filled 2018-05-02: qty 10

## 2018-05-02 MED ORDER — HEPARIN SOD (PORK) LOCK FLUSH 100 UNIT/ML IV SOLN
500.0000 [IU] | Freq: Once | INTRAVENOUS | Status: AC | PRN
  Administered 2018-05-02: 500 [IU]
  Filled 2018-05-02: qty 5

## 2018-05-02 MED ORDER — SODIUM CHLORIDE 0.9 % IV SOLN
Freq: Once | INTRAVENOUS | Status: AC
  Administered 2018-05-02: 10:00:00 via INTRAVENOUS
  Filled 2018-05-02: qty 250

## 2018-05-02 MED ORDER — SODIUM CHLORIDE 0.9% FLUSH
10.0000 mL | Freq: Once | INTRAVENOUS | Status: AC
  Administered 2018-05-02: 10 mL
  Filled 2018-05-02: qty 10

## 2018-05-02 NOTE — Patient Instructions (Signed)
[facility] Discharge Instructions for Patients Receiving Chemotherapy  Today you received the following chemotherapy agents Avastin  To help prevent nausea and vomiting after your treatment, we encourage you to take your nausea medication as directed   If you develop nausea and vomiting that is not controlled by your nausea medication, call the clinic.   BELOW ARE SYMPTOMS THAT SHOULD BE REPORTED IMMEDIATELY:  *FEVER GREATER THAN 100.5 F  *CHILLS WITH OR WITHOUT FEVER  NAUSEA AND VOMITING THAT IS NOT CONTROLLED WITH YOUR NAUSEA MEDICATION  *UNUSUAL SHORTNESS OF BREATH  *UNUSUAL BRUISING OR BLEEDING  TENDERNESS IN MOUTH AND THROAT WITH OR WITHOUT PRESENCE OF ULCERS  *URINARY PROBLEMS  *BOWEL PROBLEMS  UNUSUAL RASH Items with * indicate a potential emergency and should be followed up as soon as possible.  Feel free to call the clinic should you have any questions or concerns. The clinic phone number is [phone].  Please show the CHEMO ALERT CARD at check-in to the Emergency Department and triage nurse.   

## 2018-05-02 NOTE — Progress Notes (Signed)
Patient states he has had intermittent peripheral neuropathy and "uneasy stomach" only while taking capecitabine. States [name] is aware and this is per norm. Also states he has a toothache and will likely see a dentist. Explained to patient the importance of notifying dentist he is currently receiving treatment. Sandi Mealy, PA-C aware and will see patient in the infusion room.

## 2018-05-05 ENCOUNTER — Other Ambulatory Visit: Payer: Self-pay | Admitting: Hematology

## 2018-05-06 NOTE — Progress Notes (Signed)
The patient was seen in the infusion room today.  He reports having pain in his upper left jaw.  He has not seen a dentist in 3 years.  He is concerned that he could have a dental abscess.  He was given a prescription for Levaquin 750 mg p.o. once daily x7 days and metronidazole 500 mg p.o. every 8 hours x7 days.  He was cautioned not to drink alcohol while he is on metronidazole due to the risk for nausea and vomiting.  He will be referred for a Panorex x-ray should he not respond to antibiotic therapy.  Sandi Mealy, MHS, PA-C Physician Assistant

## 2018-05-07 ENCOUNTER — Telehealth: Payer: Self-pay | Admitting: Hematology

## 2018-05-07 NOTE — Telephone Encounter (Signed)
Scheduled appt per 12/24 sch message - pt is aware of appt date and time

## 2018-05-14 ENCOUNTER — Other Ambulatory Visit: Payer: Self-pay | Admitting: Hematology

## 2018-05-21 ENCOUNTER — Other Ambulatory Visit: Payer: Self-pay | Admitting: Hematology

## 2018-05-21 DIAGNOSIS — C787 Secondary malignant neoplasm of liver and intrahepatic bile duct: Principal | ICD-10-CM

## 2018-05-21 DIAGNOSIS — C189 Malignant neoplasm of colon, unspecified: Secondary | ICD-10-CM

## 2018-05-27 ENCOUNTER — Other Ambulatory Visit: Payer: Self-pay | Admitting: Hematology

## 2018-05-27 DIAGNOSIS — C189 Malignant neoplasm of colon, unspecified: Secondary | ICD-10-CM

## 2018-05-27 DIAGNOSIS — C787 Secondary malignant neoplasm of liver and intrahepatic bile duct: Principal | ICD-10-CM

## 2018-06-03 ENCOUNTER — Inpatient Hospital Stay: Payer: BLUE CROSS/BLUE SHIELD | Attending: Hematology | Admitting: Hematology

## 2018-06-03 ENCOUNTER — Encounter: Payer: Self-pay | Admitting: Hematology

## 2018-06-03 ENCOUNTER — Ambulatory Visit (INDEPENDENT_AMBULATORY_CARE_PROVIDER_SITE_OTHER): Payer: BLUE CROSS/BLUE SHIELD | Admitting: Psychology

## 2018-06-03 ENCOUNTER — Inpatient Hospital Stay: Payer: BLUE CROSS/BLUE SHIELD

## 2018-06-03 DIAGNOSIS — C787 Secondary malignant neoplasm of liver and intrahepatic bile duct: Secondary | ICD-10-CM | POA: Insufficient documentation

## 2018-06-03 DIAGNOSIS — F419 Anxiety disorder, unspecified: Secondary | ICD-10-CM | POA: Insufficient documentation

## 2018-06-03 DIAGNOSIS — Z9049 Acquired absence of other specified parts of digestive tract: Secondary | ICD-10-CM | POA: Diagnosis not present

## 2018-06-03 DIAGNOSIS — C189 Malignant neoplasm of colon, unspecified: Secondary | ICD-10-CM

## 2018-06-03 DIAGNOSIS — F329 Major depressive disorder, single episode, unspecified: Secondary | ICD-10-CM | POA: Diagnosis not present

## 2018-06-03 DIAGNOSIS — Z95828 Presence of other vascular implants and grafts: Secondary | ICD-10-CM

## 2018-06-03 DIAGNOSIS — I10 Essential (primary) hypertension: Secondary | ICD-10-CM | POA: Insufficient documentation

## 2018-06-03 DIAGNOSIS — D509 Iron deficiency anemia, unspecified: Secondary | ICD-10-CM | POA: Diagnosis not present

## 2018-06-03 DIAGNOSIS — C182 Malignant neoplasm of ascending colon: Secondary | ICD-10-CM | POA: Diagnosis not present

## 2018-06-03 DIAGNOSIS — C7802 Secondary malignant neoplasm of left lung: Secondary | ICD-10-CM | POA: Diagnosis not present

## 2018-06-03 DIAGNOSIS — G4700 Insomnia, unspecified: Secondary | ICD-10-CM | POA: Diagnosis not present

## 2018-06-03 DIAGNOSIS — Z79899 Other long term (current) drug therapy: Secondary | ICD-10-CM | POA: Insufficient documentation

## 2018-06-03 DIAGNOSIS — C7801 Secondary malignant neoplasm of right lung: Secondary | ICD-10-CM | POA: Diagnosis not present

## 2018-06-03 DIAGNOSIS — F4323 Adjustment disorder with mixed anxiety and depressed mood: Secondary | ICD-10-CM | POA: Diagnosis not present

## 2018-06-03 DIAGNOSIS — D500 Iron deficiency anemia secondary to blood loss (chronic): Secondary | ICD-10-CM

## 2018-06-03 LAB — COMPREHENSIVE METABOLIC PANEL
ALT: 32 U/L (ref 0–44)
AST: 48 U/L — ABNORMAL HIGH (ref 15–41)
Albumin: 3.9 g/dL (ref 3.5–5.0)
Alkaline Phosphatase: 65 U/L (ref 38–126)
Anion gap: 8 (ref 5–15)
BUN: 7 mg/dL (ref 6–20)
CO2: 25 mmol/L (ref 22–32)
Calcium: 8.7 mg/dL — ABNORMAL LOW (ref 8.9–10.3)
Chloride: 106 mmol/L (ref 98–111)
Creatinine, Ser: 0.91 mg/dL (ref 0.61–1.24)
GFR calc Af Amer: >60 mL/min (ref >60)
GFR calc non Af Amer: >60 mL/min (ref >60)
Glucose, Bld: 89 mg/dL (ref 70–99)
Potassium: 3.9 mmol/L (ref 3.5–5.1)
Sodium: 139 mmol/L (ref 135–145)
Total Bilirubin: 0.4 mg/dL (ref 0.3–1.2)
Total Protein: 6.9 g/dL (ref 6.5–8.1)

## 2018-06-03 LAB — CBC WITH DIFFERENTIAL/PLATELET
Abs Immature Granulocytes: 0.01 10*3/uL (ref 0.00–0.07)
Basophils Absolute: 0 10*3/uL (ref 0.0–0.1)
Basophils Relative: 1 %
Eosinophils Absolute: 0 10*3/uL (ref 0.0–0.5)
Eosinophils Relative: 1 %
HCT: 39.5 % (ref 39.0–52.0)
Hemoglobin: 13.6 g/dL (ref 13.0–17.0)
Immature Granulocytes: 0 %
LYMPHS ABS: 1.1 10*3/uL (ref 0.7–4.0)
Lymphocytes Relative: 33 %
MCH: 33.7 pg (ref 26.0–34.0)
MCHC: 34.4 g/dL (ref 30.0–36.0)
MCV: 98 fL (ref 80.0–100.0)
MONOS PCT: 6 %
Monocytes Absolute: 0.2 10*3/uL (ref 0.1–1.0)
Neutro Abs: 2 10*3/uL (ref 1.7–7.7)
Neutrophils Relative %: 59 %
Platelets: 141 10*3/uL — ABNORMAL LOW (ref 150–400)
RBC: 4.03 MIL/uL — ABNORMAL LOW (ref 4.22–5.81)
RDW: 14.6 % (ref 11.5–15.5)
WBC: 3.3 10*3/uL — ABNORMAL LOW (ref 4.0–10.5)
nRBC: 0 % (ref 0.0–0.2)

## 2018-06-03 LAB — TOTAL PROTEIN, URINE DIPSTICK: Protein, ur: NEGATIVE mg/dL

## 2018-06-03 MED ORDER — HEPARIN SOD (PORK) LOCK FLUSH 100 UNIT/ML IV SOLN
500.0000 [IU] | Freq: Once | INTRAVENOUS | Status: AC
  Administered 2018-06-03: 500 [IU]
  Filled 2018-06-03: qty 5

## 2018-06-03 MED ORDER — LORAZEPAM 0.5 MG PO TABS: 0.5000 mg | ORAL_TABLET | Freq: Three times a day (TID) | ORAL | 0 refills | Status: DC | PRN

## 2018-06-03 MED ORDER — SODIUM CHLORIDE 0.9% FLUSH
10.0000 mL | Freq: Once | INTRAVENOUS | Status: AC
  Administered 2018-06-03: 10 mL
  Filled 2018-06-03: qty 10

## 2018-06-03 MED ORDER — CELECOXIB 200 MG PO CAPS: 200.0000 mg | ORAL_CAPSULE | Freq: Every day | ORAL | 1 refills | Status: DC

## 2018-06-03 MED ORDER — LISINOPRIL 10 MG PO TABS: 20.0000 mg | ORAL_TABLET | Freq: Every day | ORAL | 2 refills | Status: DC

## 2018-06-03 NOTE — Progress Notes (Signed)
[place]   [phone]) [phone] Fax:(336) [phone]   Clinic Follow up Note   Patient Care Team: Curlene Labrum, MD as PCP - General (Family Medicine)  Date of Service:  06/03/2018  CHIEF COMPLAINT: Follow up metastatic right colon cancer   SUMMARY OF ONCOLOGIC HISTORY: Oncology History   Cancer Staging Metastatic colon cancer to liver [facility]) Staging form: Colon and Rectum, AJCC 8th Edition - Clinical stage from 06/01/2016: Stage IVA (cTX, cNX, pM1a) - Signed by Truitt Merle, MD on 07/04/2016 - Pathologic stage from 06/14/2016: Stage IVA (pT4b(m), pN2b, pM1a) - Signed by Truitt Merle, MD on 07/04/2016       Metastatic colon cancer to liver ([place])   04/2015 Procedure    Colonoscopy by Dr. Ladona Horns. It showed showed 2 sessile polyps ready between 3-5 mm in size located 20 cm (A, B) from the point of entry, polypectomy was performed. Pedunculated polyp was found in the ascending colon (C), polypectomy was performed, and additional polyp (D) was found 30 cm from the point of entry, removed    04/2015 Pathology Results    tubular adenoma (A and B), and well differentiated adenocarcinoma arising from tubulovillous adenoma (C) and well differentiated adenocarcinoma arising from severe dysplasia to intramucosal carcinoma within tubular adenoma.     04/2015 Initial Diagnosis    Metastatic colon cancer to liver ([place])    05/29/2016 Imaging    CT abdomen and pelvis with contrast showed an apple core like stricture in right colon just above the cecum, measuring 3.2 cm in lengths. This is highly suspicious for malignancy. Small lymph node a noticed he had adjacent mesentery, measuring 8 mm. There is a low-density lesion within the inferior aspect of the right hepatic lobe measuring 1.7 cm, suspicious for metastasis.     06/06/2016 Tumor Marker    CEA 9.99    06/08/2016 PET scan    IMPRESSION: Approximately 3 cm hypermetabolic mass in the ascending colon, consistent with primary colon  carcinoma. This mass results in colonic obstruction and small bowel dilatation. Additional areas of hypermetabolic wall thickening in the cecum may represent other sites of colon carcinoma or colitis. Mild hypermetabolic lymphadenopathy in right pericolonic region, porta hepatis, and aortocaval space, consistent with metastatic disease. Mild hypermetabolic mediastinal lymphadenopathy also seen, and thoracic lymph node metastases cannot be excluded. Solitary hypermetabolic focus in inferior right hepatic lobe, consistent with liver metastasis. Consider abdomen MRI without and with contrast for further evaluation.    06/14/2016 Surgery    Hand assisted right hemicolectomy and small bowel resection for colon cancer, liver biopsy, by Dr. Barry Dienes    06/14/2016 Pathology Results    Right hemicolectomy showed invasive well to moderately differentiated adenocarcinoma, 2 foci measuring 7.5 cm and 4.5 cm, tumor invades through full thickness of colon, to the seroma and involve the Small Bowel, Surgical Margins Were Negative, 24 Out Of 64 Lymph Nodes Were Positive, Extracapsular Extension Identified, Multiple Satellite Tumor Deposits Present, Liver Biopsy Showed Metastatic Adenocarcinoma.      06/14/2016 Miscellaneous    Tumor MMR normal, MSI stable     06/14/2016 Miscellaneous    Foundation one genomic testing showed K-ras G12 D mutation, APC and TP53 mutation. No BRAF and NRAS mutation. MSI-stable, tumor burden low.    07/06/2016 Tumor Marker    CEA 13.69    07/13/2016 - 01/18/2017 Chemotherapy    mFOLFOX, every 2 weeks, started on 07/14/2015, Avastin added from cycle 3  Oxaliplatin dose to '60mg'$ /m2 due to side effects  and some cytopenia on 09/21/16  Changed to FOLFIRINOX starting cycle 7 and Reduced  Due to neuropathy hold Oxaliplatin and add Irinotecan with neulasta on day 3 starting with cycle 7 Add low dose Oxaliplatin with cycle 8.  Due to his worsening neuropathy, and good response to chemotherapy, I  previously stopped oxaliplatin from cycle 11, and continue FOLFIRI and avastin       07/27/2016 Tumor Marker    CEA 15.85    09/04/2016 Imaging    Ct C/A/P W Contrast IMPRESSION: Interval right colectomy. Stable small liver metastasis in the inferior right hepatic lobe. Stable mild porta hepatis and aortocaval lymphadenopathy. Stable mild mediastinal lymphadenopathy. No new or progressive metastatic disease identified within the chest, abdomen, or pelvis.    12/19/2016 PET scan    IMPRESSION: 1. Right hemicolectomy, with resolution of the prior hypermetabolic activity inferiorly in the right hepatic lobe, in several mediastinal lymph nodes, and in lymph nodes in the retroperitoneum and porta hepatis. No residual hypermetabolic or enlarged lymph nodes are identified. 2. Low-grade diffuse skeletal metabolic activity is likely therapy related. 3. Coronary atherosclerosis. 4. 3 by 4 mm right middle lobe pulmonary nodule is stable, not appreciably hypermetabolic, but below sensitive PET-CT size thresholds. This may warrant surveillance.    01/11/2017 Imaging    MR Abdomen W WO Contrast IMPRESSION: 1. No acute findings within the abdomen. Previously noted liver metastasis has resolved in the interval. No new lesions.    02/01/2017 - 09/2017 Chemotherapy    Maintenance therapy, Xeloda 2037m (1000 mg/m2)  q12h on day 1-14 every 21 days plus AVASTIN, starting 02/01/2017.  stopped after 12 days due to poor tolerance on 02/14/17  Changed to maintenance 5-FU and avastin every 2 weeks starting on 02/22/17, stopped in 09/2017.       04/10/2017 Imaging    IMPRESSION: 1. Status post right hemicolectomy without findings for recurrent tumor. 2. No worrisome hepatic lesions. Treated disease with only a small residual low attenuation lesion in the right hepatic lobe. 3. No recurrent mediastinal or abdominal lymphadenopathy.     07/09/2017 PET scan    PET 07/09/17 IMPRESSION: 1. Status post right  hemicolectomy, without findings of hypermetabolic recurrent or metastatic disease. 2. A right middle lobe pulmonary nodule is unchanged. However, there is a right lower lobe 5 mm pulmonary nodule which is felt to be new and enlarged compared to prior exams. Suspicious for an isolated pulmonary metastasis. Consider CT follow-up at 3-6 months. 3. Age advanced coronary artery atherosclerosis. Recommend assessment of coronary risk factors and consideration of medical therapy. 4. Borderline ascending aortic dilatation, 4.0 cm      10/10/2017 Imaging    IMPRESSION: 1. Several (at least 10) subcentimeter pulmonary nodules scattered in both lungs, predominantly in the lower lobes, all new/increased, most compatible with enlarging pulmonary metastases, largest 8 mm in the right lower lobe. 2. No additional findings of new or progressive metastatic disease. No recurrent adenopathy. Stable small low-attenuation lesion in the inferior right liver lobe compatible with treated metastasis. No new liver metastases. 3. Stable ectatic 4.0 cm ascending thoracic aorta. Recommend annual imaging followup by CTA or MRA.  4. Stable mild splenomegaly.     10/11/2017 - 12/2017 Chemotherapy    FOLFIRI and Avastin every 2 weeks starting 10/11/17, irnotecan stopped in 12/2017 due to disease progression      01/08/2018 Imaging    01/08/2018 CT CAP IMPRESSION: 1. Progressive hepatic and pulmonary metastatic disease. 2. Ascending Aortic aneurysm NOS (ICD10-I71.9).  01/08/2018 Progression    01/08/2018 CT CAP IMPRESSION: 1. Progressive hepatic and pulmonary metastatic disease. 2. Ascending Aortic aneurysm NOS (ICD10-I71.9).    01/10/2018 - 03/07/2018 Chemotherapy    FOLFOX and Avastin every 2 weeks starting on 01/10/2018. Stopped due to disease progression     03/19/2018 Progression    03/19/2018 CT CAP IMPRESSION: 1. Interval increase in size of dominant nodule within the right lower lobe. There are a few  additional nodules which have increased in size. Multiple additional small bilateral pulmonary nodules are grossly similar. 2. Slight interval increase in size of lesion within the right hepatic lobe. 3. Interval increase in soft tissue at the surgical anastomosis involving the small bowel within the central abdomen. Locally recurrent disease not excluded.     04/07/2018 - 04/2018 Chemotherapy    Third lineXeloda '1500mg'$  BID 7 days on/7 days off and Avastin every 2 weeksstarting 04/07/18 stopped after 1 cycle and 4 days because he started Clinical trail.     05/22/2018 Imaging    CT CAP from 05/22/18 at [facility] FINDINGS:   LINES AND TUBES: None.  LOWER THORAX: Please see same day CT chest for findings above the diaphragm.Marland Kitchen  HEPATOBILIARY: Low-attenuation lesion in segment 5 has minimally increased in size from prior measuring 2.4 x 2.0 cm, previously 2.3 x 1.8 cm when remeasured (8:42). The gallbladder is present and otherwise unremarkable. No biliary dilatation.   SPLEEN: Unremarkable. PANCREAS: Unremarkable.  ADRENALS: Unremarkable. KIDNEYS/URETERS: Unremarkable.  BLADDER: Decompressed making further evaluation difficult. PELVIC/REPRODUCTIVE ORGANS: Unremarkable.  GI TRACT: No dilated or thick walled loops of bowel. Sequelae of right hemicolectomy and appendectomy.   PERITONEUM/RETROPERITONEUM AND MESENTERY: Minimally decreased spiculated mesenteric soft tissue density at the site of enteroenteric anastomosis, now measuring roughly 2.6 x 1.4 cm, previously 3.3 x 3.1 cm (8:54). There is also a mild amount of stranding near the anastomosis within the mid abdomen (8:54)   LYMPH NODES: Borderline enlarged 1.0 cm porta hepatic lymph node, previously 0.7 cm (8:26). VESSELS: The aorta is normal in caliber.  No significant calcified atherosclerotic disease. Though the contrast timing is suboptimal for evaluation of the portal venous system, the portal venous system is appears patent. The  hepatic veins and IVC are unremarkable.  BONES AND SOFT TISSUES: Small fat-containing ventral hernia..    05/24/2018 -  Chemotherapy    [id number] with palbociclib (Ibrnace) '100mg'$  3 weeks on/1 week off and binimetinib 15 mg BID starting 05/24/18.      CURRENT THERAPY:  [id number] with palbociclib Leslee Home) '100mg'$  3 weeks on/1 week off and binimetinib 15 mg BID starting 05/24/18.  INTERVAL HISTORY:  Nikolis Berent is here for a follow up of treatment. He presents to the clinic today with his wife. He notes he started Clinical trail with Dr. Truman Hayward at [facility] for [place] 3 weeks on and 1 week off and binimetinib BID. He notes he was on Xeloda before the trail for 1 cycle and 3-4 days of cycle 2.  He notes from [place] he has scalp itching and facial rash with erythema. He has been more fatigued and cold. He notes having lower appetite and has been eating 1 solid meal a day. He will do broth in between. He has had diarrhea from that. He will take 16 ounces of carnation breakfast. The neuropathy in his feet are still present, minimally in his hands.   He notes having a torn rotator cuff. He would have to wait until after chemotherapy for surgery. He  is getting injections in right shoulder for now with orthopedist.     REVIEW OF SYSTEMS:   Constitutional: Denies fevers, chills or abnormal weight loss (+) low appetite (+) Fatigue  Eyes: Denies blurriness of vision Ears, nose, mouth, throat, and face: Denies mucositis or sore throat Respiratory: Denies cough, dyspnea or wheezes Cardiovascular: Denies palpitation, chest discomfort or lower extremity swelling Gastrointestinal:  Denies nausea, heartburn (+) Controlled diarrhea  Skin: (+) Itching of scalp and facial rash  MSK: (+) Torn right rotator cuff  Lymphatics: Denies new lymphadenopathy or easy bruising Neurological:Denies numbness, tingling or new weaknesses Behavioral/Psych: Mood is stable, no new  changes  All other systems were reviewed with the patient and are negative.  MEDICAL HISTORY:  Past Medical History:  Diagnosis Date  . Anxiety   . Cancer of ascending colon ([place])   . Seasonal allergies     SURGICAL HISTORY: Past Surgical History:  Procedure Laterality Date  . APPENDECTOMY  1992  . CATARACT EXTRACTION W/ INTRAOCULAR LENS IMPLANT Left 04/2016  . COLON SURGERY    . COLONOSCOPY W/ BIOPSIES AND POLYPECTOMY  04/2015  . INGUINAL HERNIA REPAIR Right 1982  . IR RADIOLOGIST EVAL & MGMT  01/16/2017  . KNEE ARTHROSCOPY W/ MENISCECTOMY Right 1998  . LAPAROSCOPIC RIGHT COLECTOMY N/A 06/14/2016   Procedure: LAPAROSCOPIC HAND ASSISTED HEMICOLECTOMY AND SMALL BOWEL RESECTION.;  Surgeon: [name], MD;  Location: [place];  Service: General;  Laterality: N/A;  . LIVER BIOPSY Right 06/14/2016   Procedure: LIVER BIOPSY;  Surgeon: [name], MD;  Location: [place];  Service: General;  Laterality: Right;  Right Inferior Liver  . PORTACATH PLACEMENT N/A 07/06/2016   Procedure: INSERTION PORT-A-CATH;  Surgeon: [name], MD;  Location: [place];  Service: General;  Laterality: N/A;  . VASECTOMY  2005    I have reviewed the social history and family history with the patient and they are unchanged from previous note.  ALLERGIES:  is allergic to penicillins and xeloda [capecitabine].  MEDICATIONS:  Current Outpatient Medications  Medication Sig Dispense Refill  . amLODipine (NORVASC) 10 MG tablet Take 1 tablet (10 mg total) by mouth daily. 30 tablet 5  . celecoxib (CELEBREX) 200 MG capsule Take 1 capsule (200 mg total) by mouth daily. 60 capsule 1  . diphenoxylate-atropine (LOMOTIL) 2.5-0.025 MG tablet Take 2 tablets by mouth 4 (four) times daily as needed for diarrhea or loose stools. 30 tablet 0  . levofloxacin (LEVAQUIN) 750 MG tablet Take 1 tablet (750 mg total) by mouth daily. 7 tablet 0  . lisinopril (PRINIVIL,ZESTRIL) 10 MG tablet Take 2 tablets (20 mg total) by  mouth daily. 180 tablet 2  . LORazepam (ATIVAN) 0.5 MG tablet Take 1 tablet (0.5 mg total) by mouth every 8 (eight) hours as needed (for nausea). 30 tablet 0  . metroNIDAZOLE (FLAGYL) 500 MG tablet Take 1 tablet (500 mg total) by mouth 3 (three) times daily. 21 tablet 0  . Multiple Vitamin (MULTIVITAMIN WITH MINERALS) TABS tablet Take 1 tablet by mouth daily.    Marland Kitchen omeprazole (PRILOSEC) 20 MG capsule Take 1 capsule (20 mg total) by mouth 2 (two) times daily before a meal. 60 capsule 2  . ondansetron (ZOFRAN ODT) 8 MG disintegrating tablet Take 1 tablet (8 mg total) by mouth every 8 (eight) hours as needed for nausea or vomiting. 30 tablet 2  . prochlorperazine (COMPAZINE) 10 MG tablet Take 1 tablet (10 mg total) by mouth every 6 (six) hours as needed for nausea  or vomiting. 30 tablet 3  . traMADol (ULTRAM) 50 MG tablet Take 1 tablet (50 mg total) by mouth every 6 (six) hours as needed. 60 tablet 1  . urea (CARMOL) 10 % cream Apply topically 3 (three) times daily. 71 g 1  . venlafaxine XR (EFFEXOR-XR) 150 MG 24 hr capsule TAKE ONE CAPSULE BY MOUTH DAILY WITH BREAKFAST 90 capsule 1   No current facility-administered medications for this visit.    Facility-Administered Medications Ordered in Other Visits  Medication Dose Route Frequency Provider Last Rate Last Dose  . clindamycin (CLEOCIN) 900 mg in dextrose 5 % 50 mL IVPB  900 mg Intravenous 60 min Pre-Op [name], MD       And  . gentamicin (GARAMYCIN) 450 mg in dextrose 5 % 50 mL IVPB  5 mg/kg Intravenous 60 min Pre-Op [name], MD      . dexamethasone (DECADRON) injection 10 mg  10 mg Intravenous Once Harle Stanford., PA-C      . diphenoxylate-atropine (LOMOTIL) 2.5-0.025 MG per tablet 2 tablet  2 tablet Oral Once Harle Stanford., PA-C      . sodium chloride flush (NS) 0.9 % injection 10 mL  10 mL Intracatheter Once Truitt Merle, MD        PHYSICAL EXAMINATION: ECOG PERFORMANCE STATUS: 1 - Symptomatic but completely ambulatory  Vitals:    06/03/18 1536  BP: (!) 143/109  Pulse: 70  Resp: 18  Temp: 97.8 F (36.6 C)  SpO2: 100%   Filed Weights   06/03/18 1536  Weight: 203 lb 8 oz (92.3 kg)    GENERAL:alert, no distress and comfortable SKIN: skin color, texture, turgor are normal, no rashes or significant lesions EYES: normal, Conjunctiva are pink and non-injected, sclera clear OROPHARYNX:no exudate, no erythema and lips, buccal mucosa, and tongue normal  NECK: supple, thyroid normal size, non-tender, without nodularity LYMPH:  no palpable lymphadenopathy in the cervical, axillary or inguinal LUNGS: clear to auscultation and percussion with normal breathing effort HEART: regular rate & rhythm and no murmurs and no lower extremity edema ABDOMEN:abdomen soft, non-tender and normal bowel sounds Musculoskeletal:no cyanosis of digits and no clubbing  NEURO: alert & oriented x 3 with fluent speech, no focal motor/sensory deficits  LABORATORY DATA:  I have reviewed the data as listed CBC Latest Ref Rng & Units 06/03/2018 05/02/2018 04/18/2018  WBC 4.0 - 10.5 K/uL 3.3(L) 3.6(L) 4.3  Hemoglobin 13.0 - 17.0 g/dL 13.6 13.3 13.2  Hematocrit 39.0 - 52.0 % 39.5 40.0 40.0  Platelets 150 - 400 K/uL 141(L) 176 173     CMP Latest Ref Rng & Units 06/03/2018 05/02/2018 04/18/2018  Glucose 70 - 99 mg/dL 89 107(H) 123(H)  BUN 6 - 20 mg/dL '7 10 13  '$ Creatinine 0.61 - 1.24 mg/dL 0.91 1.10 1.21  Sodium 135 - 145 mmol/L 139 139 140  Potassium 3.5 - 5.1 mmol/L 3.9 4.7 4.8  Chloride 98 - 111 mmol/L 106 105 108  CO2 22 - 32 mmol/L '25 24 23  '$ Calcium 8.9 - 10.3 mg/dL 8.7(L) 9.3 9.6  Total Protein 6.5 - 8.1 g/dL 6.9 7.2 7.2  Total Bilirubin 0.3 - 1.2 mg/dL 0.4 0.7 0.5  Alkaline Phos 38 - 126 U/L 65 65 61  AST 15 - 41 U/L 48(H) 23 23  ALT 0 - 44 U/L 32 17 17      RADIOGRAPHIC STUDIES: I have personally reviewed the radiological images as listed and agreed with the findings in the report. No results found.  ASSESSMENT & PLAN:    Nicholas Lowery is a 58 y.o. male with   1. Right colon cancer with liver, node and lung metastasis, pT4bN2bM1, stage IV, MSI-stable, KRAS G12D mutation (+) -He was diagnosed in 04/2015. He is s/p small bowel resection, adjuvant mFOLFOX which was switched to  FOLFIRINOX and then dose reduced to FOLFIRI. -He unfortunately he progressed on maintenance 5-FU, FOLFIRI and FOLFOX.  -He started 3rd line Xeloda and Avastin in 03/2018 but after 1 cycle and 3 days treated he stopped to proceed with [facility] Clinical trail with Dr. Truman Hayward -He has participated the Clinical Trail [id number] with palbociclib '100mg'$  3 weeks on/1 week off and binimetinib 15 mg BID starting 05/24/18. He was randomized to [place] and Binimetinib arm  -He is tolerating moderately well with fatigue, low appetite, scalp itching, facial rash, and controlled diarrhea. -I discussed if this trail does not work for him, he has the option of oral chemo L[place]f or another clinical trail.  -Labs reviewed, PLT at 141K, WBC at 3.3, Ca at 8.7 and AST at 48, otherwise CBC and CMP WNL. Iron panel and CEA are still pending.  -I encouraged him to drink more water and quit alcohol.  -F/u with me as needed   2. Depression, Anxiety  -Pt has long-standing history of depression, has been on Prozac for decades, it was very well controlled in the past.   -He has become more depressed lately with disease progression and work and family responsibilities. He previously developed multiple symptoms, including depressed mood, insomnia, personality change, etc. He also had suicidal ideas. -He has good family and social support.   -Currently on Effexor and Ativan  -His mood improved lately given he is on clinical trail.   3. Hypertension  -On Amlodipine and Lisinopril, I refilled today  -He does not want to follow-up with his primary care physician. -BP at 143/109 today (06/03/18), he states his BP has been normal at home   4. Anemia of iron deficiency and  chemo  -He has mild anemia, likely related to his colon cancer bleeding. -continue oral iron supplements  -Hg normal at 12.6 and iron study has been normal lately. Will continue to monitor.   5. Anorexia and weight loss secondary to chemo  -Secondary to underlying malignancy -Eats mostly broth for main meal -He will continue nutational supplement   6. Peripheral neuropathy, G1  -Secondary to Oxaliplatin, dose reduced and ultimately discontinued -Continue vitamin B-complex supplements -Hand function normalized but overall stable numbness on his toes, will continue to monitor.   7. Goal of care discussion  -The patient understands the goal of care is palliative.  -He is full code now   8. Torn right rotator cuff -Orthopedic surgeon will not proceed with surgery until he is off chemotherapy.  -He has been receiving injections for now.  -has tramadol to take as needed.      Plan  -I refilled his Celebrex, Lisinopril and ativan today  -He will continue on Clinical trail at [facility] with Dr. Truman Hayward -F/u with me as needed   No problem-specific Assessment & Plan notes found for this encounter.   No orders of the defined types were placed in this encounter.  All questions were answered. The patient knows to call the clinic with any problems, questions or concerns. No barriers to learning was detected. I spent 20 minutes counseling the patient face to face. The total time spent in the appointment was 25 minutes and more than 50% was on counseling  and review of test results     Truitt Merle, MD 06/03/2018   I, Joslyn Devon, am acting as scribe for Truitt Merle, MD.   I have reviewed the above documentation for accuracy and completeness, and I agree with the above.

## 2018-06-04 ENCOUNTER — Telehealth: Payer: Self-pay | Admitting: Hematology

## 2018-06-04 LAB — CEA (IN HOUSE-CHCC): CEA (CHCC-In House): 12.77 ng/mL — ABNORMAL HIGH (ref 0.00–5.00)

## 2018-06-04 LAB — IRON AND TIBC
Iron: 116 ug/dL (ref 42–163)
Saturation Ratios: 34 % (ref 20–55)
TIBC: 343 ug/dL (ref 202–409)
UIBC: 226 ug/dL (ref 117–376)

## 2018-06-04 LAB — FERRITIN: Ferritin: 242 ng/mL (ref 24–336)

## 2018-06-04 NOTE — Telephone Encounter (Signed)
No los per 01/20.

## 2018-06-10 ENCOUNTER — Telehealth: Payer: Self-pay

## 2018-06-10 NOTE — Telephone Encounter (Signed)
Left voice message for patient regarding lab results.  Per [name] iron level is normal and tumor marker slightly decreased, good news.

## 2018-06-10 NOTE — Telephone Encounter (Signed)
-----   Message from Truitt Merle, MD sent at 06/05/2018 10:53 PM EST ----- Please let pt know his lab results, iron level normal, tumor marker slightly decreased, which is good news. No other concerns.  Truitt Merle  06/05/2018

## 2018-06-17 ENCOUNTER — Other Ambulatory Visit: Payer: Self-pay | Admitting: *Deleted

## 2018-06-17 ENCOUNTER — Inpatient Hospital Stay: Payer: BLUE CROSS/BLUE SHIELD | Attending: Hematology | Admitting: Medical

## 2018-06-17 ENCOUNTER — Other Ambulatory Visit: Payer: Self-pay | Admitting: Emergency Medicine

## 2018-06-17 ENCOUNTER — Inpatient Hospital Stay: Payer: BLUE CROSS/BLUE SHIELD

## 2018-06-17 ENCOUNTER — Telehealth: Payer: Self-pay

## 2018-06-17 VITALS — BP 132/96 | HR 80 | Temp 98.2°F | Resp 18 | Ht 70.0 in | Wt 202.6 lb

## 2018-06-17 DIAGNOSIS — M791 Myalgia, unspecified site: Secondary | ICD-10-CM | POA: Diagnosis not present

## 2018-06-17 DIAGNOSIS — Z87891 Personal history of nicotine dependence: Secondary | ICD-10-CM | POA: Diagnosis not present

## 2018-06-17 DIAGNOSIS — D509 Iron deficiency anemia, unspecified: Secondary | ICD-10-CM | POA: Insufficient documentation

## 2018-06-17 DIAGNOSIS — R1312 Dysphagia, oropharyngeal phase: Secondary | ICD-10-CM | POA: Diagnosis not present

## 2018-06-17 DIAGNOSIS — Z79899 Other long term (current) drug therapy: Secondary | ICD-10-CM

## 2018-06-17 DIAGNOSIS — C182 Malignant neoplasm of ascending colon: Secondary | ICD-10-CM | POA: Diagnosis not present

## 2018-06-17 DIAGNOSIS — C787 Secondary malignant neoplasm of liver and intrahepatic bile duct: Secondary | ICD-10-CM | POA: Insufficient documentation

## 2018-06-17 DIAGNOSIS — E860 Dehydration: Secondary | ICD-10-CM

## 2018-06-17 DIAGNOSIS — C7802 Secondary malignant neoplasm of left lung: Secondary | ICD-10-CM | POA: Insufficient documentation

## 2018-06-17 DIAGNOSIS — M2550 Pain in unspecified joint: Secondary | ICD-10-CM

## 2018-06-17 DIAGNOSIS — C7801 Secondary malignant neoplasm of right lung: Secondary | ICD-10-CM

## 2018-06-17 LAB — CBC WITH DIFFERENTIAL (CANCER CENTER ONLY)
ABS IMMATURE GRANULOCYTES: 0 10*3/uL (ref 0.00–0.07)
BASOS PCT: 1 %
Basophils Absolute: 0 10*3/uL (ref 0.0–0.1)
Eosinophils Absolute: 0 10*3/uL (ref 0.0–0.5)
Eosinophils Relative: 2 %
HCT: 35.6 % — ABNORMAL LOW (ref 39.0–52.0)
Hemoglobin: 12.2 g/dL — ABNORMAL LOW (ref 13.0–17.0)
Immature Granulocytes: 0 %
Lymphocytes Relative: 25 %
Lymphs Abs: 0.6 10*3/uL — ABNORMAL LOW (ref 0.7–4.0)
MCH: 33.7 pg (ref 26.0–34.0)
MCHC: 34.3 g/dL (ref 30.0–36.0)
MCV: 98.3 fL (ref 80.0–100.0)
Monocytes Absolute: 0.4 10*3/uL (ref 0.1–1.0)
Monocytes Relative: 19 %
NEUTROS ABS: 1.2 10*3/uL — AB (ref 1.7–7.7)
Neutrophils Relative %: 53 %
Platelet Count: 81 10*3/uL — ABNORMAL LOW (ref 150–400)
RBC: 3.62 MIL/uL — ABNORMAL LOW (ref 4.22–5.81)
RDW: 14.9 % (ref 11.5–15.5)
WBC: 2.2 10*3/uL — AB (ref 4.0–10.5)
nRBC: 0 % (ref 0.0–0.2)

## 2018-06-17 LAB — CMP (CANCER CENTER ONLY)
ALT: 20 U/L (ref 0–44)
AST: 31 U/L (ref 15–41)
Albumin: 3.7 g/dL (ref 3.5–5.0)
Alkaline Phosphatase: 80 U/L (ref 38–126)
Anion gap: 6 (ref 5–15)
BUN: 10 mg/dL (ref 6–20)
CO2: 29 mmol/L (ref 22–32)
Calcium: 9 mg/dL (ref 8.9–10.3)
Chloride: 101 mmol/L (ref 98–111)
Creatinine: 1.11 mg/dL (ref 0.61–1.24)
GFR, Est AFR Am: >60 mL/min (ref >60)
GFR, Estimated: >60 mL/min (ref >60)
Glucose, Bld: 115 mg/dL — ABNORMAL HIGH (ref 70–99)
POTASSIUM: 4.6 mmol/L (ref 3.5–5.1)
Sodium: 136 mmol/L (ref 135–145)
Total Bilirubin: 0.6 mg/dL (ref 0.3–1.2)
Total Protein: 6.8 g/dL (ref 6.5–8.1)

## 2018-06-17 LAB — MAGNESIUM: Magnesium: 1.9 mg/dL (ref 1.7–2.4)

## 2018-06-17 LAB — INFLUENZA PANEL BY PCR (TYPE A & B)
Influenza A By PCR: NEGATIVE
Influenza B By PCR: NEGATIVE

## 2018-06-17 MED ORDER — LIDOCAINE VISCOUS HCL 2 % MT SOLN
15.0000 mL | Freq: Once | OROMUCOSAL | Status: AC
  Administered 2018-06-17: 15 mL via ORAL
  Filled 2018-06-17: qty 15

## 2018-06-17 MED ORDER — ALUM & MAG HYDROXIDE-SIMETH 200-200-20 MG/5ML PO SUSP
ORAL | Status: AC
  Filled 2018-06-17: qty 30

## 2018-06-17 MED ORDER — ALUM & MAG HYDROXIDE-SIMETH 200-200-20 MG/5ML PO SUSP
30.0000 mL | Freq: Once | ORAL | Status: AC
  Administered 2018-06-17: 30 mL via ORAL

## 2018-06-17 MED ORDER — LIDOCAINE VISCOUS HCL 2 % MT SOLN: 5.0000 mL | OROMUCOSAL | 2 refills | Status: DC | PRN

## 2018-06-17 MED ORDER — MAGIC MOUTHWASH: 5.0000 mL | Freq: Four times a day (QID) | ORAL | 2 refills | Status: DC | PRN

## 2018-06-17 MED ORDER — SODIUM CHLORIDE 0.9 % IV SOLN
Freq: Once | INTRAVENOUS | Status: AC
  Administered 2018-06-17: 14:00:00 via INTRAVENOUS
  Filled 2018-06-17: qty 250

## 2018-06-17 NOTE — Telephone Encounter (Signed)
Received after hours message that patient has been experiencing diarrhea, feels dehydrated requesting IVF, attempted to reach patient to get him to come into [facility] this morning for labs and IVF, left a message for him to return my call.

## 2018-06-17 NOTE — Progress Notes (Signed)
Pt presents today for soreness in mouth, fatigue, and anorexia.  Afebrile.  Denies CP or SOB.  Mild nausea without V/D.  Per pt request IV 22G placed in RFA instead of using port.  1L NS given and GI cocktail given.  Tolerated well.  VSS.  PT advised to call back tomorrow if he continues to feel dehydrated.  Verbalized understanding.

## 2018-06-17 NOTE — Patient Instructions (Signed)

## 2018-06-19 NOTE — Progress Notes (Signed)
[facility] Progress Note   Nicholas Lowery [id number] November 02, 1960 58 y.o.  Tsugio Elison is managed by Dr. Truitt Merle  Actively treated with chemotherapy/immunotherapy/hormonal therapy: yes  Current Therapy: [facility] Clinical Trial #ACCRU [id number] (binimetinib and palbociclib)   Assessment: Plan:    Oropharyngeal dysphagia - Plan: alum & mag hydroxide-simeth (MAALOX/MYLANTA) 200-200-20 MG/5ML suspension 30 mL, lidocaine (XYLOCAINE) 2 % viscous mouth solution 15 mL  Dehydration - Plan: 0.9 %  sodium chloride infusion  Myalgia - Plan: Influenza panel by PCR (type A & B)  Arthralgia, unspecified joint - Plan: Influenza panel by PCR (type A & B)   1) Metastatic Colon Cancer: The patient is currently being treated with clinical trial #ACCRU [id number] with binimetinib and palbociclib at [facility].  2) Mucositis & dehydration: The patient was given 1 L NS today IV.  Additionally he was given a GI cocktail to swish and swallow while here today.  A prescription for viscous lidocaine and magic mouthwash was sent to his pharmacy.   Please see After Visit Summary for patient specific instructions.  Future Appointments  Date Time Provider [place]  07/01/2018  3:00 PM Oren Binet, PhD [facility] None    Orders Placed This Encounter  Procedures  . Influenza panel by PCR (type A & B)       Subjective:   Patient ID:  Nicholas Lowery is a 58 y.o. (DOB 02/17/1961) male.  Chief Complaint:  Chief Complaint  Patient presents with  . Fatigue    HPI Nicholas Lowery is a 58 y.o. male with a metastatic colon cancer who is managed by [name] and is currently being treated in a clinical trial (ACCRU [phone] with binimetinib and palbociclib) at [facility].  He presents today with mucositis, difficulty eating d/t oral tenderness, dehydration, a diffuse rash over his scalp and forehead since beginning his clinical trial, myalgias, arthralgias, night sweats, pain  of his left internal nares and one episode of brief epistaxis.  He was exposed to someone last week who tested positive for influenza.  He requested a flu test today which returned negative.  Medications: I have reviewed the patient's current medications.  Allergies:  Allergies  Allergen Reactions  . Penicillins Other (See Comments)    UNSPECIFIED REACTION   Has patient had a PCN reaction causing immediate rash, facial/tongue/throat swelling, SOB or lightheadedness with hypotension: unknown Has patient had a PCN reaction causing severe rash involving mucus membranes or skin necrosis: unknown Has patient had a PCN reaction that required hospitalization unknown Has patient had a PCN reaction occurring within the last 10 years: No If all of the above answers are "NO", then may proceed with Cephalosporin use.   [name] [Capecitabine] Other (See Comments)    Foot pain, couldn't walk, bleeding gums, vomiting    Past Medical History:  Diagnosis Date  . Anxiety   . Cancer of ascending colon ([place])   . Seasonal allergies     Past Surgical History:  Procedure Laterality Date  . APPENDECTOMY  1992  . CATARACT EXTRACTION W/ INTRAOCULAR LENS IMPLANT Left 04/2016  . COLON SURGERY    . COLONOSCOPY W/ BIOPSIES AND POLYPECTOMY  04/2015  . INGUINAL HERNIA REPAIR Right 1982  . IR RADIOLOGIST EVAL & MGMT  01/16/2017  . KNEE ARTHROSCOPY W/ MENISCECTOMY Right 1998  . LAPAROSCOPIC RIGHT COLECTOMY N/A 06/14/2016   Procedure: LAPAROSCOPIC HAND ASSISTED HEMICOLECTOMY AND SMALL BOWEL RESECTION.;  Surgeon: [name], MD;  Location: [place];  Service: General;  Laterality:  N/A;  . LIVER BIOPSY Right 06/14/2016   Procedure: LIVER BIOPSY;  Surgeon: [name], MD;  Location: [place];  Service: General;  Laterality: Right;  Right Inferior Liver  . PORTACATH PLACEMENT N/A 07/06/2016   Procedure: INSERTION PORT-A-CATH;  Surgeon: [name], MD;  Location: [place];  Service: General;   Laterality: N/A;  . VASECTOMY  2005    Family History  Problem Relation Age of Onset  . Cancer Mother        lung cancer  . Stroke Mother   . Hypertension Father   . CAD Father   . Cancer Maternal Grandfather        prostate cancer     Social History   Socioeconomic History  . Marital status: Married    Spouse name: Not on file  . Number of children: Not on file  . Years of education: Not on file  . Highest education level: Not on file  Occupational History  . Not on file  Social Needs  . Financial resource strain: Not on file  . Food insecurity:    Worry: Not on file    Inability: Not on file  . Transportation needs:    Medical: Not on file    Non-medical: Not on file  Tobacco Use  . Smoking status: Former Smoker    Years: 2.00    Types: Cigarettes    Last attempt to quit: 1990    Years since quitting: 30.1  . Smokeless tobacco: Never Used  Substance and Sexual Activity  . Alcohol use: Yes    Alcohol/week: 4.0 standard drinks    Types: 4 Cans of beer per week    Comment: social, none since colon surgery  . Drug use: No    Comment: 06/15/2016 "nothing since college"  . Sexual activity: Yes  Lifestyle  . Physical activity:    Days per week: Not on file    Minutes per session: Not on file  . Stress: Not on file  Relationships  . Social connections:    Talks on phone: Not on file    Gets together: Not on file    Attends religious service: Not on file    Active member of club or organization: Not on file    Attends meetings of clubs or organizations: Not on file    Relationship status: Not on file  . Intimate partner violence:    Fear of current or ex partner: Not on file    Emotionally abused: Not on file    Physically abused: Not on file    Forced sexual activity: Not on file  Other Topics Concern  . Not on file  Social History Narrative  . Not on file    Past Medical History, Surgical history, Social history, and Family history were reviewed and  updated as appropriate.   Please see review of systems for further details on the patient's review from today.   Review of Systems:  Review of Systems  Constitutional: Positive for appetite change and fatigue. Negative for chills, diaphoresis and fever.  HENT: Positive for mouth sores and trouble swallowing. Negative for sore throat and voice change.   Respiratory: Negative for cough, chest tightness, shortness of breath and wheezing.   Cardiovascular: Negative for chest pain and palpitations.  Gastrointestinal: Positive for diarrhea. Negative for abdominal pain, constipation, nausea and vomiting.  Musculoskeletal: Negative for back pain and myalgias.  Neurological: Negative for dizziness, light-headedness and headaches.    Objective:  Physical Exam:  BP (!) 132/96 (BP Location: Left Arm, Patient Position: Sitting) Comment: Nurse aware  Pulse 80   Temp 98.2 F (36.8 C) (Oral)   Resp 18   Ht 5\' 10"  (1.778 m)   Wt 202 lb 9.6 oz (91.9 kg)   SpO2 100%   BMI 29.07 kg/m  ECOG: 1  Physical Exam Constitutional:      General: He is not in acute distress.    Appearance: He is not diaphoretic.  HENT:     Head: Normocephalic.     Nose:     Comments: There is an area of ulceration in the left nares.    Mouth/Throat:     Mouth: Mucous membranes are moist.     Comments: Multiple areas of ulceration in the buccal membranes and tongue. Eyes:     General: No scleral icterus.       Right eye: No discharge.        Left eye: No discharge.  Neck:     Musculoskeletal: Normal range of motion and neck supple.  Cardiovascular:     Rate and Rhythm: Normal rate and regular rhythm.     Heart sounds: Normal heart sounds. No murmur. No friction rub. No gallop.   Pulmonary:     Effort: Pulmonary effort is normal. No respiratory distress.     Breath sounds: Normal breath sounds. No stridor. No wheezing or rales.  Chest:     Chest wall: No tenderness.  Abdominal:     General: Abdomen is flat.  Bowel sounds are normal. There is no distension.     Palpations: Abdomen is soft.     Tenderness: There is no abdominal tenderness. There is no guarding.  Lymphadenopathy:     Cervical: No cervical adenopathy.  Skin:    General: Skin is warm and dry.     Findings: Rash (Multiple pinpoint erythematous lesions over the scalp and forehead.) present.  Neurological:     Mental Status: He is alert.     Gait: Gait normal.  Psychiatric:        Mood and Affect: Mood normal.        Behavior: Behavior normal.        Thought Content: Thought content normal.        Judgment: Judgment normal.     Lab Review:     Component Value Date/Time   NA 136 06/17/2018 1149   NA 139 05/17/2017 0931   K 4.6 06/17/2018 1149   K 4.9 05/17/2017 0931   CL 101 06/17/2018 1149   CO2 29 06/17/2018 1149   CO2 27 05/17/2017 0931   GLUCOSE 115 (H) 06/17/2018 1149   GLUCOSE 86 05/17/2017 0931   BUN 10 06/17/2018 1149   BUN 16.8 05/17/2017 0931   CREATININE 1.11 06/17/2018 1149   CREATININE 1.1 05/17/2017 0931   CALCIUM 9.0 06/17/2018 1149   CALCIUM 9.8 05/17/2017 0931   PROT 6.8 06/17/2018 1149   PROT 7.0 05/17/2017 0931   ALBUMIN 3.7 06/17/2018 1149   ALBUMIN 4.1 05/17/2017 0931   AST 31 06/17/2018 1149   AST 17 05/17/2017 0931   ALT 20 06/17/2018 1149   ALT 16 05/17/2017 0931   ALKPHOS 80 06/17/2018 1149   ALKPHOS 44 05/17/2017 0931   BILITOT 0.6 06/17/2018 1149   BILITOT 0.57 05/17/2017 0931   GFRNONAA >60 06/17/2018 1149   GFRAA >60 06/17/2018 1149       Component Value Date/Time   WBC 2.2 (L) 06/17/2018 1149  WBC 3.3 (L) 06/03/2018 1434   RBC 3.62 (L) 06/17/2018 1149   HGB 12.2 (L) 06/17/2018 1149   HGB 13.4 05/17/2017 0931   HCT 35.6 (L) 06/17/2018 1149   HCT 40.5 05/17/2017 0931   PLT 81 (L) 06/17/2018 1149   PLT 119 (L) 05/17/2017 0931   MCV 98.3 06/17/2018 1149   MCV 96.9 05/17/2017 0931   MCH 33.7 06/17/2018 1149   MCHC 34.3 06/17/2018 1149   RDW 14.9 06/17/2018 1149   RDW  15.3 (H) 05/17/2017 0931   LYMPHSABS 0.6 (L) 06/17/2018 1149   LYMPHSABS 0.7 (L) 05/17/2017 0931   MONOABS 0.4 06/17/2018 1149   MONOABS 0.5 05/17/2017 0931   EOSABS 0.0 06/17/2018 1149   EOSABS 0.1 05/17/2017 0931   BASOSABS 0.0 06/17/2018 1149   BASOSABS 0.0 05/17/2017 0931   -------------------------------  Imaging from last 24 hours (if applicable):  Radiology interpretation: No results found.

## 2018-07-01 ENCOUNTER — Ambulatory Visit: Payer: BLUE CROSS/BLUE SHIELD | Admitting: Psychology

## 2018-07-04 ENCOUNTER — Encounter: Payer: Self-pay | Admitting: Hematology

## 2018-07-12 ENCOUNTER — Other Ambulatory Visit: Payer: Self-pay | Admitting: Hematology

## 2018-07-12 MED ORDER — TRAMADOL HCL 50 MG PO TABS: 50.0000 mg | ORAL_TABLET | Freq: Four times a day (QID) | ORAL | 1 refills | Status: DC | PRN

## 2018-08-14 ENCOUNTER — Other Ambulatory Visit: Payer: Self-pay | Admitting: Hematology

## 2018-08-20 ENCOUNTER — Encounter: Payer: Self-pay | Admitting: Hematology

## 2018-08-21 ENCOUNTER — Encounter: Payer: Self-pay | Admitting: Hematology

## 2018-08-21 ENCOUNTER — Other Ambulatory Visit: Payer: Self-pay

## 2018-08-21 ENCOUNTER — Inpatient Hospital Stay: Payer: BLUE CROSS/BLUE SHIELD | Attending: Hematology

## 2018-08-21 ENCOUNTER — Ambulatory Visit (HOSPITAL_COMMUNITY)
Admission: RE | Admit: 2018-08-21 | Discharge: 2018-08-21 | Disposition: A | Discharge status: Home / Self Care | Payer: BLUE CROSS/BLUE SHIELD | Source: Ambulatory Visit | Attending: Hematology | Admitting: Hematology

## 2018-08-21 ENCOUNTER — Inpatient Hospital Stay (HOSPITAL_BASED_OUTPATIENT_CLINIC_OR_DEPARTMENT_OTHER): Payer: BLUE CROSS/BLUE SHIELD | Admitting: Hematology

## 2018-08-21 ENCOUNTER — Telehealth: Payer: Self-pay

## 2018-08-21 ENCOUNTER — Encounter: Payer: Self-pay | Admitting: Medical

## 2018-08-21 VITALS — BP 142/102 | HR 81 | Temp 98.3°F | Resp 17 | Ht 70.0 in | Wt 195.5 lb

## 2018-08-21 DIAGNOSIS — C787 Secondary malignant neoplasm of liver and intrahepatic bile duct: Secondary | ICD-10-CM | POA: Insufficient documentation

## 2018-08-21 DIAGNOSIS — C7801 Secondary malignant neoplasm of right lung: Secondary | ICD-10-CM | POA: Insufficient documentation

## 2018-08-21 DIAGNOSIS — Z79899 Other long term (current) drug therapy: Secondary | ICD-10-CM | POA: Diagnosis not present

## 2018-08-21 DIAGNOSIS — C189 Malignant neoplasm of colon, unspecified: Secondary | ICD-10-CM

## 2018-08-21 DIAGNOSIS — Z66 Do not resuscitate: Secondary | ICD-10-CM | POA: Insufficient documentation

## 2018-08-21 DIAGNOSIS — Z87891 Personal history of nicotine dependence: Secondary | ICD-10-CM | POA: Insufficient documentation

## 2018-08-21 DIAGNOSIS — F419 Anxiety disorder, unspecified: Secondary | ICD-10-CM

## 2018-08-21 DIAGNOSIS — C7802 Secondary malignant neoplasm of left lung: Secondary | ICD-10-CM | POA: Diagnosis not present

## 2018-08-21 DIAGNOSIS — D509 Iron deficiency anemia, unspecified: Secondary | ICD-10-CM | POA: Insufficient documentation

## 2018-08-21 DIAGNOSIS — G4700 Insomnia, unspecified: Secondary | ICD-10-CM | POA: Insufficient documentation

## 2018-08-21 DIAGNOSIS — Z5112 Encounter for antineoplastic immunotherapy: Secondary | ICD-10-CM | POA: Diagnosis not present

## 2018-08-21 DIAGNOSIS — I10 Essential (primary) hypertension: Secondary | ICD-10-CM

## 2018-08-21 DIAGNOSIS — F329 Major depressive disorder, single episode, unspecified: Secondary | ICD-10-CM

## 2018-08-21 DIAGNOSIS — D500 Iron deficiency anemia secondary to blood loss (chronic): Secondary | ICD-10-CM

## 2018-08-21 DIAGNOSIS — G629 Polyneuropathy, unspecified: Secondary | ICD-10-CM

## 2018-08-21 DIAGNOSIS — C182 Malignant neoplasm of ascending colon: Secondary | ICD-10-CM | POA: Diagnosis not present

## 2018-08-21 DIAGNOSIS — R1312 Dysphagia, oropharyngeal phase: Secondary | ICD-10-CM | POA: Diagnosis not present

## 2018-08-21 LAB — COMPREHENSIVE METABOLIC PANEL
ALT: 16 U/L (ref 0–44)
AST: 20 U/L (ref 15–41)
Albumin: 4.1 g/dL (ref 3.5–5.0)
Alkaline Phosphatase: 82 U/L (ref 38–126)
Anion gap: 8 (ref 5–15)
BUN: 10 mg/dL (ref 6–20)
CO2: 29 mmol/L (ref 22–32)
Calcium: 9.5 mg/dL (ref 8.9–10.3)
Chloride: 101 mmol/L (ref 98–111)
Creatinine, Ser: 1.13 mg/dL (ref 0.61–1.24)
GFR calc Af Amer: >60 mL/min (ref >60)
GFR calc non Af Amer: >60 mL/min (ref >60)
Glucose, Bld: 143 mg/dL — ABNORMAL HIGH (ref 70–99)
Potassium: 4.4 mmol/L (ref 3.5–5.1)
Sodium: 138 mmol/L (ref 135–145)
Total Bilirubin: 0.5 mg/dL (ref 0.3–1.2)
Total Protein: 7.8 g/dL (ref 6.5–8.1)

## 2018-08-21 LAB — CBC WITH DIFFERENTIAL/PLATELET
Abs Immature Granulocytes: 0.01 10*3/uL (ref 0.00–0.07)
Basophils Absolute: 0.1 10*3/uL (ref 0.0–0.1)
Basophils Relative: 1 %
Eosinophils Absolute: 0.1 10*3/uL (ref 0.0–0.5)
Eosinophils Relative: 1 %
HCT: 43.3 % (ref 39.0–52.0)
Hemoglobin: 14.4 g/dL (ref 13.0–17.0)
Immature Granulocytes: 0 %
Lymphocytes Relative: 12 %
Lymphs Abs: 0.7 10*3/uL (ref 0.7–4.0)
MCH: 33.4 pg (ref 26.0–34.0)
MCHC: 33.3 g/dL (ref 30.0–36.0)
MCV: 100.5 fL — ABNORMAL HIGH (ref 80.0–100.0)
Monocytes Absolute: 0.6 10*3/uL (ref 0.1–1.0)
Monocytes Relative: 11 %
Neutro Abs: 4.1 10*3/uL (ref 1.7–7.7)
Neutrophils Relative %: 75 %
Platelets: 212 10*3/uL (ref 150–400)
RBC: 4.31 MIL/uL (ref 4.22–5.81)
RDW: 14.4 % (ref 11.5–15.5)
WBC: 5.5 10*3/uL (ref 4.0–10.5)
nRBC: 0 % (ref 0.0–0.2)

## 2018-08-21 LAB — TOTAL PROTEIN, URINE DIPSTICK: Protein, ur: NEGATIVE mg/dL

## 2018-08-21 LAB — CEA (IN HOUSE-CHCC): CEA (CHCC-In House): 17.12 ng/mL — ABNORMAL HIGH (ref 0.00–5.00)

## 2018-08-21 MED ORDER — REGORAFENIB 40 MG PO TABS: 80.0000 mg | ORAL_TABLET | Freq: Every day | ORAL | 2 refills | Status: DC

## 2018-08-21 MED ORDER — HYDROCODONE-ACETAMINOPHEN 5-325 MG PO TABS: 1.0000 | ORAL_TABLET | Freq: Four times a day (QID) | ORAL | 0 refills | Status: DC | PRN

## 2018-08-21 NOTE — Telephone Encounter (Signed)
-----   Message from Truitt Merle, MD sent at 08/21/2018  4:11 PM EDT ----- Could you call pt and let him know his x-ray today was fine, no signs of bowel obstruction? Thanks much  [employer]

## 2018-08-21 NOTE — Progress Notes (Signed)
Nicholas Lowery   [phone] [phone]   Clinic Follow up Note   Patient Care Team: Curlene Labrum, MD as PCP - General (Family Medicine)  Date of Service:  08/21/2018  CHIEF COMPLAINT: F/u of metastatic colon cancer  SUMMARY OF ONCOLOGIC HISTORY: Oncology History   Cancer Staging Metastatic colon cancer to liver [facility]) Staging form: Colon and Rectum, AJCC 8th Edition - Clinical stage from 06/01/2016: Stage IVA (cTX, cNX, pM1a) - Signed by Truitt Merle, MD on 07/04/2016 - Pathologic stage from 06/14/2016: Stage IVA (pT4b(m), pN2b, pM1a) - Signed by Truitt Merle, MD on 07/04/2016       Metastatic colon cancer to liver ([place])   04/2015 Procedure    Colonoscopy by Dr. Ladona Horns. It showed showed 2 sessile polyps ready between 3-5 mm in size located 20 cm (A, B) from the point of entry, polypectomy was performed. Pedunculated polyp was found in the ascending colon (C), polypectomy was performed, and additional polyp (D) was found 30 cm from the point of entry, removed    04/2015 Pathology Results    tubular adenoma (A and B), and well differentiated adenocarcinoma arising from tubulovillous adenoma (C) and well differentiated adenocarcinoma arising from severe dysplasia to intramucosal carcinoma within tubular adenoma.     04/2015 Initial Diagnosis    Metastatic colon cancer to liver ([place])    05/29/2016 Imaging    CT abdomen and pelvis with contrast showed an apple core like stricture in right colon just above the cecum, measuring 3.2 cm in lengths. This is highly suspicious for malignancy. Small lymph node a noticed he had adjacent mesentery, measuring 8 mm. There is a low-density lesion within the inferior aspect of the right hepatic lobe measuring 1.7 cm, suspicious for metastasis.     06/06/2016 Tumor Marker    CEA 9.99    06/08/2016 PET scan    IMPRESSION: Approximately 3 cm hypermetabolic mass in the ascending colon, consistent with primary colon carcinoma.  This mass results in colonic obstruction and small bowel dilatation. Additional areas of hypermetabolic wall thickening in the cecum may represent other sites of colon carcinoma or colitis. Mild hypermetabolic lymphadenopathy in right pericolonic region, porta hepatis, and aortocaval space, consistent with metastatic disease. Mild hypermetabolic mediastinal lymphadenopathy also seen, and thoracic lymph node metastases cannot be excluded. Solitary hypermetabolic focus in inferior right hepatic lobe, consistent with liver metastasis. Consider abdomen MRI without and with contrast for further evaluation.    06/14/2016 Surgery    Hand assisted right hemicolectomy and small bowel resection for colon cancer, liver biopsy, by Dr. Barry Dienes    06/14/2016 Pathology Results    Right hemicolectomy showed invasive well to moderately differentiated adenocarcinoma, 2 foci measuring 7.5 cm and 4.5 cm, tumor invades through full thickness of colon, to the seroma and involve the Small Bowel, Surgical Margins Were Negative, 24 Out Of 64 Lymph Nodes Were Positive, Extracapsular Extension Identified, Multiple Satellite Tumor Deposits Present, Liver Biopsy Showed Metastatic Adenocarcinoma.      06/14/2016 Miscellaneous    Tumor MMR normal, MSI stable     06/14/2016 Miscellaneous    Foundation one genomic testing showed K-ras G12 D mutation, APC and TP53 mutation. No BRAF and NRAS mutation. MSI-stable, tumor burden low.    07/06/2016 Tumor Marker    CEA 13.69    07/13/2016 - 01/18/2017 Chemotherapy    mFOLFOX, every 2 weeks, started on 07/14/2015, Avastin added from cycle 3  Oxaliplatin dose to '60mg'$ /m2 due to side effects and some  cytopenia on 09/21/16  Changed to FOLFIRINOX starting cycle 7 and Reduced  Due to neuropathy hold Oxaliplatin and add Irinotecan with neulasta on day 3 starting with cycle 7 Add low dose Oxaliplatin with cycle 8.  Due to his worsening neuropathy, and good response to chemotherapy, I previously  stopped oxaliplatin from cycle 11, and continue FOLFIRI and avastin       07/27/2016 Tumor Marker    CEA 15.85    09/04/2016 Imaging    Ct C/A/P W Contrast IMPRESSION: Interval right colectomy. Stable small liver metastasis in the inferior right hepatic lobe. Stable mild porta hepatis and aortocaval lymphadenopathy. Stable mild mediastinal lymphadenopathy. No new or progressive metastatic disease identified within the chest, abdomen, or pelvis.    12/19/2016 PET scan    IMPRESSION: 1. Right hemicolectomy, with resolution of the prior hypermetabolic activity inferiorly in the right hepatic lobe, in several mediastinal lymph nodes, and in lymph nodes in the retroperitoneum and porta hepatis. No residual hypermetabolic or enlarged lymph nodes are identified. 2. Low-grade diffuse skeletal metabolic activity is likely therapy related. 3. Coronary atherosclerosis. 4. 3 by 4 mm right middle lobe pulmonary nodule is stable, not appreciably hypermetabolic, but below sensitive PET-CT size thresholds. This may warrant surveillance.    01/11/2017 Imaging    MR Abdomen W WO Contrast IMPRESSION: 1. No acute findings within the abdomen. Previously noted liver metastasis has resolved in the interval. No new lesions.    02/01/2017 - 09/2017 Chemotherapy    Maintenance therapy, Xeloda '2000mg'$  (1000 mg/m2)  q12h on day 1-14 every 21 days plus AVASTIN, starting 02/01/2017.  stopped after 12 days due to poor tolerance on 02/14/17  Changed to maintenance 5-FU and avastin every 2 weeks starting on 02/22/17, stopped in 09/2017.       04/10/2017 Imaging    IMPRESSION: 1. Status post right hemicolectomy without findings for recurrent tumor. 2. No worrisome hepatic lesions. Treated disease with only a small residual low attenuation lesion in the right hepatic lobe. 3. No recurrent mediastinal or abdominal lymphadenopathy.     07/09/2017 PET scan    PET 07/09/17 IMPRESSION: 1. Status post right  hemicolectomy, without findings of hypermetabolic recurrent or metastatic disease. 2. A right middle lobe pulmonary nodule is unchanged. However, there is a right lower lobe 5 mm pulmonary nodule which is felt to be new and enlarged compared to prior exams. Suspicious for an isolated pulmonary metastasis. Consider CT follow-up at 3-6 months. 3. Age advanced coronary artery atherosclerosis. Recommend assessment of coronary risk factors and consideration of medical therapy. 4. Borderline ascending aortic dilatation, 4.0 cm      10/10/2017 Imaging    IMPRESSION: 1. Several (at least 10) subcentimeter pulmonary nodules scattered in both lungs, predominantly in the lower lobes, all new/increased, most compatible with enlarging pulmonary metastases, largest 8 mm in the right lower lobe. 2. No additional findings of new or progressive metastatic disease. No recurrent adenopathy. Stable small low-attenuation lesion in the inferior right liver lobe compatible with treated metastasis. No new liver metastases. 3. Stable ectatic 4.0 cm ascending thoracic aorta. Recommend annual imaging followup by CTA or MRA.  4. Stable mild splenomegaly.     10/11/2017 - 12/2017 Chemotherapy    FOLFIRI and Avastin every 2 weeks starting 10/11/17, irnotecan stopped in 12/2017 due to disease progression      01/08/2018 Imaging    01/08/2018 CT CAP IMPRESSION: 1. Progressive hepatic and pulmonary metastatic disease. 2. Ascending Aortic aneurysm NOS (ICD10-I71.9).    01/08/2018 Progression  01/08/2018 CT CAP IMPRESSION: 1. Progressive hepatic and pulmonary metastatic disease. 2. Ascending Aortic aneurysm NOS (ICD10-I71.9).    01/10/2018 - 03/07/2018 Chemotherapy    FOLFOX and Avastin every 2 weeks starting on 01/10/2018. Stopped due to disease progression     03/19/2018 Progression    03/19/2018 CT CAP IMPRESSION: 1. Interval increase in size of dominant nodule within the right lower lobe. There are a few  additional nodules which have increased in size. Multiple additional small bilateral pulmonary nodules are grossly similar. 2. Slight interval increase in size of lesion within the right hepatic lobe. 3. Interval increase in soft tissue at the surgical anastomosis involving the small bowel within the central abdomen. Locally recurrent disease not excluded.     04/07/2018 - 04/2018 Chemotherapy    Third lineXeloda '1500mg'$  BID 7 days on/7 days off and Avastin every 2 weeksstarting 04/07/18 stopped after 1 cycle and 4 days because he started Clinical trail.     05/22/2018 Imaging    CT CAP from 05/22/18 at [facility] FINDINGS:   LINES AND TUBES: None.  LOWER THORAX: Please see same day CT chest for findings above the diaphragm.Marland Kitchen  HEPATOBILIARY: Low-attenuation lesion in segment 5 has minimally increased in size from prior measuring 2.4 x 2.0 cm, previously 2.3 x 1.8 cm when remeasured (8:42). The gallbladder is present and otherwise unremarkable. No biliary dilatation.   SPLEEN: Unremarkable. PANCREAS: Unremarkable.  ADRENALS: Unremarkable. KIDNEYS/URETERS: Unremarkable.  BLADDER: Decompressed making further evaluation difficult. PELVIC/REPRODUCTIVE ORGANS: Unremarkable.  GI TRACT: No dilated or thick walled loops of bowel. Sequelae of right hemicolectomy and appendectomy.   PERITONEUM/RETROPERITONEUM AND MESENTERY: Minimally decreased spiculated mesenteric soft tissue density at the site of enteroenteric anastomosis, now measuring roughly 2.6 x 1.4 cm, previously 3.3 x 3.1 cm (8:54). There is also a mild amount of stranding near the anastomosis within the mid abdomen (8:54)   LYMPH NODES: Borderline enlarged 1.0 cm porta hepatic lymph node, previously 0.7 cm (8:26). VESSELS: The aorta is normal in caliber.  No significant calcified atherosclerotic disease. Though the contrast timing is suboptimal for evaluation of the portal venous system, the portal venous system is appears patent. The  hepatic veins and IVC are unremarkable.  BONES AND SOFT TISSUES: Small fat-containing ventral hernia..    05/24/2018 - 08/02/2018 Chemotherapy    [facility] [id number] with palbociclib Lucia Bitter) '100mg'$  3 weeks on/1 week off and binimetinib 15 mg BID starting 05/24/18. Stopped 08/02/18 due to disease progression.     08/02/2018 Imaging    CT CAP 08/02/18 at [facility] IMPRESSION:  Since 05/22/2018:  -Unchanged to mildly decreased in size segment 6 hepatic lesion.  -Unchanged irregular soft tissue density at the enteroenteric anastomosis.  -Increase in size of an irregular soft tissue density at the enterocolic anastomosis. This is new since 03/19/2018.  -Mild decrease in size of porta hepatis lymph node.  IMPRESSION:  Slight interval increase in multiple metastatic pulmonary nodules.  New likely malignant small right pleural effusion. Interval prominent right infrahilar lymph nodes; metastasis is in the differential. Mild interlobular septal thickening in the right middle and lower lobes is concerning for lymphangitic tumor spread. Attention on follow-up is suggested.       Chemotherapy    PENDING oral Ragorafinib daily and IV Nivolumab q2weeks starting 09/02/18          CURRENT THERAPY:  PENDING oral Ragorafinib '80mg'$  daily day 1-21 every 28 days as tolerated and IV Nivolumab '3mg'$ /kg q2-4weeks starting 09/02/18    INTERVAL HISTORY:  Nicholas Lowery  Lowery is here for a follow up of metastatic colon cancer. He presents to the clinic today by himself. He called his wife to be included in the visit today.  He recently progressed on [facility] clinical trail. He did not see the latest scan himself 2 weeks ago.  He notes since then he has radiating pain under base of ribs across abdomen. The pain is mainly a constant 4 but with cramping it will increase to 8/10. This effects his sleep and makes it painful to breathe and catches his breath. He finds it hard to work and will leave early as needed.  He is concerned this is related to more tumor growth and his prior surgery sutures. He notes his BMs and gas have been normal. He did have nausea and vomiting last week and again last night. The pain is present whether he eats or not.  He notes when laying down he feels bubbling through esophagus.   He saw another clinical oncologist, Dr. Bartholome Bill, who feels his prognosis is much less now and has been depressed and stressed about this. This Oncologist notes the other treatment option is Lonsurf.     REVIEW OF SYSTEMS:   Constitutional: Denies fevers, chills or abnormal weight loss (+) trouble sleeping Eyes: Denies blurriness of vision Ears, nose, mouth, throat, and face: Denies mucositis or sore throat Respiratory: Denies cough, dyspnea or wheezes Cardiovascular: Denies palpitation, chest discomfort or lower extremity swelling Gastrointestinal:  Denies nausea, heartburn or change in bowel habits (+) constant upper abdominal pain radiating across base of ribcage with occasional cramping (4-8/10) Skin: Denies abnormal skin rashes Lymphatics: Denies new lymphadenopathy or easy bruising Neurological:Denies numbness, tingling or new weaknesses Behavioral/Psych: Mood is stable, no new changes (+) Depressed and stressed.  All other systems were reviewed with the patient and are negative.  MEDICAL HISTORY:  Past Medical History:  Diagnosis Date  . Anxiety   . Cancer of ascending colon ([place])   . Seasonal allergies     SURGICAL HISTORY: Past Surgical History:  Procedure Laterality Date  . APPENDECTOMY  1992  . CATARACT EXTRACTION W/ INTRAOCULAR LENS IMPLANT Left 04/2016  . COLON SURGERY    . COLONOSCOPY W/ BIOPSIES AND POLYPECTOMY  04/2015  . INGUINAL HERNIA REPAIR Right 1982  . IR RADIOLOGIST EVAL & MGMT  01/16/2017  . KNEE ARTHROSCOPY W/ MENISCECTOMY Right 1998  . LAPAROSCOPIC RIGHT COLECTOMY N/A 06/14/2016   Procedure: LAPAROSCOPIC HAND ASSISTED HEMICOLECTOMY AND SMALL BOWEL  RESECTION.;  Surgeon: [name], MD;  Location: [place];  Service: General;  Laterality: N/A;  . LIVER BIOPSY Right 06/14/2016   Procedure: LIVER BIOPSY;  Surgeon: [name], MD;  Location: [place];  Service: General;  Laterality: Right;  Right Inferior Liver  . PORTACATH PLACEMENT N/A 07/06/2016   Procedure: INSERTION PORT-A-CATH;  Surgeon: [name], MD;  Location: [place];  Service: General;  Laterality: N/A;  . VASECTOMY  2005    I have reviewed the social history and family history with the patient and they are unchanged from previous note.  ALLERGIES:  is allergic to penicillins and xeloda [capecitabine].  MEDICATIONS:  Current Outpatient Medications  Medication Sig Dispense Refill  . amLODipine (NORVASC) 10 MG tablet Take 1 tablet (10 mg total) by mouth daily. 30 tablet 5  . Avanafil (STENDRA) 200 MG TABS     . capecitabine (XELODA) 500 MG tablet capecitabine 500 mg tablet    . celecoxib (CELEBREX) 200 MG capsule Take 1 capsule (200 mg  total) by mouth daily. 60 capsule 1  . dexamethasone (DECADRON) 4 MG tablet UP TO THREE DAYS AFTER CHEMO (BEGIN DAY TWO)    . diphenoxylate-atropine (LOMOTIL) 2.5-0.025 MG tablet Take 2 tablets by mouth 4 (four) times daily as needed for diarrhea or loose stools. 30 tablet 0  . hydrocortisone 2.5 % cream APPLY TO SCALY AREAS ON FACE AT BEDTIME    . ketoconazole (NIZORAL) 2 % cream APPLY TO SCALY AREAS ON FACE EVERY MORNING    . levofloxacin (LEVAQUIN) 750 MG tablet Take 1 tablet (750 mg total) by mouth daily. 7 tablet 0  . lidocaine (XYLOCAINE) 2 % solution Use as directed 5 mLs in the mouth or throat every 3 (three) hours as needed for mouth pain. 100 mL 2  . lidocaine-prilocaine (EMLA) cream apply ONE gram topically AS NEEDED    . lisinopril (PRINIVIL,ZESTRIL) 10 MG tablet Take 2 tablets (20 mg total) by mouth daily. 180 tablet 2  . LORazepam (ATIVAN) 0.5 MG tablet Take 1 tablet (0.5 mg total) by mouth every 8 (eight) hours as  needed (for nausea). 30 tablet 0  . magic mouthwash SOLN Take 5 mLs by mouth 4 (four) times daily as needed for mouth pain. 240 mL 2  . metroNIDAZOLE (FLAGYL) 500 MG tablet Take 1 tablet (500 mg total) by mouth 3 (three) times daily. 21 tablet 0  . Multiple Vitamin (MULTIVITAMIN WITH MINERALS) TABS tablet Take 1 tablet by mouth daily.    Marland Kitchen omeprazole (PRILOSEC) 20 MG capsule TAKE ONE CAPSULE BY MOUTH TWICE DAILY BEFORE A MEAL 60 capsule 2  . ondansetron (ZOFRAN ODT) 8 MG disintegrating tablet Take 1 tablet (8 mg total) by mouth every 8 (eight) hours as needed for nausea or vomiting. 30 tablet 2  . prochlorperazine (COMPAZINE) 10 MG tablet Take 1 tablet (10 mg total) by mouth every 6 (six) hours as needed for nausea or vomiting. 30 tablet 3  . sildenafil (REVATIO) 20 MG tablet TAKE 3-5 TABLETS BY MOUTH AT least 30 MINUTES prior TO sex    . traMADol (ULTRAM) 50 MG tablet Take 1 tablet (50 mg total) by mouth every 6 (six) hours as needed. 60 tablet 1  . urea (CARMOL) 10 % cream Apply topically 3 (three) times daily. 71 g 1  . venlafaxine XR (EFFEXOR-XR) 150 MG 24 hr capsule TAKE ONE CAPSULE BY MOUTH DAILY WITH BREAKFAST 90 capsule 1  . HYDROcodone-acetaminophen (NORCO) 5-325 MG tablet Take 1 tablet by mouth every 6 (six) hours as needed for moderate pain. 30 tablet 0   No current facility-administered medications for this visit.    Facility-Administered Medications Ordered in Other Visits  Medication Dose Route Frequency Provider Last Rate Last Dose  . clindamycin (CLEOCIN) 900 mg in dextrose 5 % 50 mL IVPB  900 mg Intravenous 60 min Pre-Op [name], MD       And  . gentamicin (GARAMYCIN) 450 mg in dextrose 5 % 50 mL IVPB  5 mg/kg Intravenous 60 min Pre-Op [name], MD      . dexamethasone (DECADRON) injection 10 mg  10 mg Intravenous Once Harle Stanford., PA-C      . diphenoxylate-atropine (LOMOTIL) 2.5-0.025 MG per tablet 2 tablet  2 tablet Oral Once Harle Stanford., PA-C      . sodium  chloride flush (NS) 0.9 % injection 10 mL  10 mL Intracatheter Once Truitt Merle, MD        PHYSICAL EXAMINATION: ECOG PERFORMANCE STATUS: 1 - Symptomatic but  completely ambulatory  Vitals:   08/21/18 1256 08/21/18 1259  BP: (!) 150/100 (!) 142/102  Pulse: 81   Resp: 17   Temp: 98.3 F (36.8 C)   SpO2: 96%    Filed Weights   08/21/18 1256  Weight: 195 lb 8 oz (88.7 kg)    GENERAL:alert, no distress and comfortable SKIN: skin color, texture, turgor are normal, no rashes or significant lesions EYES: normal, Conjunctiva are pink and non-injected, sclera clear OROPHARYNX:no exudate, no erythema and lips, buccal mucosa, and tongue normal  NECK: supple, thyroid normal size, non-tender, without nodularity LYMPH:  no palpable lymphadenopathy in the cervical, axillary or inguinal LUNGS: clear to auscultation and percussion with normal breathing effort HEART: regular rate & rhythm and no murmurs and no lower extremity edema ABDOMEN:abdomen soft and normal bowel sounds (+) Upper abdominal tenderness R>L.  Musculoskeletal:no cyanosis of digits and no clubbing  NEURO: alert & oriented x 3 with fluent speech, no focal motor/sensory deficits  LABORATORY DATA:  I have reviewed the data as listed CBC Latest Ref Rng & Units 08/21/2018 06/17/2018 06/03/2018  WBC 4.0 - 10.5 K/uL 5.5 2.2(L) 3.3(L)  Hemoglobin 13.0 - 17.0 g/dL 14.4 12.2(L) 13.6  Hematocrit 39.0 - 52.0 % 43.3 35.6(L) 39.5  Platelets 150 - 400 K/uL 212 81(L) 141(L)     CMP Latest Ref Rng & Units 08/21/2018 06/17/2018 06/03/2018  Glucose 70 - 99 mg/dL 143(H) 115(H) 89  BUN 6 - 20 mg/dL '10 10 7  '$ Creatinine 0.61 - 1.24 mg/dL 1.13 1.11 0.91  Sodium 135 - 145 mmol/L 138 136 139  Potassium 3.5 - 5.1 mmol/L 4.4 4.6 3.9  Chloride 98 - 111 mmol/L 101 101 106  CO2 22 - 32 mmol/L '29 29 25  '$ Calcium 8.9 - 10.3 mg/dL 9.5 9.0 8.7(L)  Total Protein 6.5 - 8.1 g/dL 7.8 6.8 6.9  Total Bilirubin 0.3 - 1.2 mg/dL 0.5 0.6 0.4  Alkaline Phos 38 - 126 U/L 82  80 65  AST 15 - 41 U/L 20 31 48(H)  ALT 0 - 44 U/L 16 20 32      RADIOGRAPHIC STUDIES: I have personally reviewed the radiological images as listed and agreed with the findings in the report. Dg Abd 2 Views  Result Date: 08/21/2018 CLINICAL DATA:  Nausea.  Rule out bowel obstruction. EXAM: ABDOMEN - 2 VIEW COMPARISON:  CT 03/19/2018. FINDINGS: Nonobstructive bowel gas pattern. No free air, organomegaly or suspicious calcification. No acute bony abnormality. IMPRESSION: No evidence of bowel obstruction or free air. Electronically Signed   By: Rolm Baptise M.D.   On: 08/21/2018 14:07     ASSESSMENT & PLAN:  Jakarie Pember is a 58 y.o. male with   1. Right colon cancer with liver, node and lung metastasis, pT4bN2bM1, stage IV, MSI-stable, KRAS G12D mutation (+) -He was diagnosed in 04/2015. He is s/p small bowel resection, adjuvant mFOLFOX which was switched to FOLFIRINOX and then dose reduced to FOLFIRI. -He unfortunately he progressed on maintenance 5-FU, FOLFIRI and FOLFOX.  -He started 3rd line Xeloda and Avastin in 03/2018 but after 1 cycle and 3 days treated he stopped to proceed with [facility] [id number] with palbociclib '100mg'$  3 weeks on/1 week off and binimetinib 15 mg BID starting 05/24/18 with Dr. Truman Hayward -Unfortunately he progressed on [place] and was released from [facility] care. I discussed his CT CAP from 08/02/18 which shows unchanged to mildly decreased in size of known liver lesion, but increased in multiple pulmonary metastasis and  increased size of an irregular soft tissue density at the enterocolic anastomosis (1.8AC) concerning for cancer progression. He also has a 2.8cm unchanged irregular soft tissue density at the enteroenteric anastomosis  -For the last 2 weeks he has had constant upper abdominal pain radiating across base of ribcage with occasional cramping. He has normal bowel movements and bowel sounds on exam today. To rule out bowel obstruction I  will do Xray. If not bowel obstruction will manage cancer related pain with medication. He pain is likely related to her local recurrence at the anastomosis  -I discussed his treatment options at this point. He has several lines of standard treatment and clinical trail. There are still options of Lonsurf or Ragorafinib. I discussed his cancer is not curable, and average survival is probably 6-12 months  -I discussed there is a phase 1 clinical trial data ([place]IV[place], JCO 37(15), 2019) which shows good response with Ragorafinib and Nivolumab in MSS metastatic colorectal cancer (objective response rate 29%). This is currently being investigated in phase 2 and 3 studies. Due to the current COVID 62 outbreak, he is not able to participate the trials, and I will offer this regimen off label.   --Chemotherapy consent: Side effects including but does not limited to, fatigue, nausea, vomiting, diarrhea, hand-foot syndrome, hypertension, risk of bleeding and thrombosis, proteinuria and renal small risk of bowel perforation, function, and other endocrine disorders pneumonitis,etc were discussed with patient in great detail. He agrees to proceed. -the goal of therapy is palliative -Labs reviewed, CBC and CMP WNL except BG 143, Iron panel and CEA are still pending.  -F/u in 2 weeks to start treatment   2. Upper abdominal pain  -For the last 2 weeks he has had constant upper abdominal pain radiating across base of ribcage with occasion cramping (4-8/10).  -I discussed this is likely secondary to disease progression. Will get Xray to rule out bowel obstruction from new lesions.  -He is currently on Tramadol as needed without much pain relief. I called in [facility] q6hours.  -With increased pain medication, I recommend he use miralax or stool softener.  -I advised him to avoid alcohol consumption as this can further effect his liver and ability to continue treatment. He is willing to reduce his alcohol intake.   3.  Depression, Anxiety  -Pt has long-standing history of depression, has been on Prozac for decades, it was very well controlled in the past.  -He has good family and social support.  -Currently on Effexor and Ativan  -He has become more depressed again with disease progression and stress of prognosis.  -I strongly encouraged him to f/u with his Psychiatrist, he is agreeable.   4. Hypertension  -On Amlodipine and Lisinopril, I refilled today  -He does not want to follow-up with his primary care physician. -BP at 142/102 today (08/21/18)  5. Anemia of iron deficiency and chemo  -He has mild anemia, likely related to his colon cancer bleeding. -Hg normal today. Iron panel is still pending. (08/21/18). Continue oral iron supplements   6. Anorexia and weight loss secondary to chemo  -Secondary to underlying malignancy -He will continue nutritional supplement   7. Peripheral neuropathy, G1  -Secondary to Oxaliplatin, dose reduced and ultimately discontinued -Continue vitamin B-complex supplements -Not mentioned today, likely resolved now.   8. Goal of care discussion  -The patient understands the goal of care is palliative.  -He is full code now    Plan -I prescribed him [facility] today for his abdominal pain  -  abd X-RAY today at [facility]  -Lab, f/u and nivolumab on 4/20  -I will call in regorafenib to [place] today   No problem-specific Assessment & Plan notes found for this encounter.   Orders Placed This Encounter  Procedures  . DG Abd 2 Views    Standing Status:   Future    Number of Occurrences:   1    Standing Expiration Date:   08/21/2019    Order Specific Question:   Reason for Exam (SYMPTOM  OR DIAGNOSIS REQUIRED)    Answer:   rule out bowel obstruction    Order Specific Question:   Preferred imaging location?    Answer:   [facility]    Order Specific Question:   Radiology Contrast Protocol - do NOT remove file path    Answer:    \\[email or web address]   All questions were answered. The patient knows to call the clinic with any problems, questions or concerns. No barriers to learning was detected. I spent 30 minutes counseling the patient face to face. The total time spent in the appointment was 40 minutes and more than 50% was on counseling and review of test results     Truitt Merle, MD 08/21/2018   I, Joslyn Devon, am acting as scribe for Truitt Merle, MD.   I have reviewed the above documentation for accuracy and completeness, and I agree with the above.

## 2018-08-21 NOTE — Progress Notes (Signed)
DISCONTINUE ON PATHWAY REGIMEN - Colorectal     A cycle is every 14 days:     Oxaliplatin        Dose Mod: None     Leucovorin        Dose Mod: None     5-Fluorouracil        Dose Mod: None     5-Fluorouracil        Dose Mod: None     Bevacizumab        Dose Mod: None  **Always confirm dose/schedule in your pharmacy ordering system**  REASON: Disease Progression PRIOR TREATMENT: COS72: Neoadjuvant mFOLFOX6 + Bevacizumab (Number of Cycles to be Determined by Surgeon) TREATMENT RESPONSE: Unable to Evaluate  START OFF PATHWAY REGIMEN - Colorectal   [id number]:Nivolumab 3 mg/kg q14 Days:   A cycle is every 14 days:     Nivolumab   **Always confirm dose/schedule in your pharmacy ordering system**  Patient Characteristics: Distant Metastases, Fourth Line and Beyond, BRAF Wild-Type/Unknown, MSS / pMMR Therapeutic Status: Distant Metastases BRAF Mutation Status: Wild-Type (no mutation) KRAS/NRAS Mutation Status: Mutation Positive Line of Therapy: Fourth Line and Beyond Microsatellite/Mismatch Repair Status: MSS/pMMR Intent of Therapy: Non-Curative / Palliative Intent, Discussed with Patient

## 2018-08-21 NOTE — Telephone Encounter (Signed)
Left voice message for patient per [name] x-ray today was fine, no signs of bowel obstruction.

## 2018-08-22 ENCOUNTER — Telehealth: Payer: Self-pay

## 2018-08-22 ENCOUNTER — Telehealth: Payer: Self-pay | Admitting: Pharmacist

## 2018-08-22 ENCOUNTER — Telehealth: Payer: Self-pay | Admitting: Hematology

## 2018-08-22 DIAGNOSIS — C787 Secondary malignant neoplasm of liver and intrahepatic bile duct: Principal | ICD-10-CM

## 2018-08-22 DIAGNOSIS — C189 Malignant neoplasm of colon, unspecified: Secondary | ICD-10-CM

## 2018-08-22 DIAGNOSIS — I10 Essential (primary) hypertension: Secondary | ICD-10-CM

## 2018-08-22 LAB — IRON AND TIBC
Iron: 82 ug/dL (ref 42–163)
Saturation Ratios: 23 % (ref 20–55)
TIBC: 365 ug/dL (ref 202–409)
UIBC: 283 ug/dL (ref 117–376)

## 2018-08-22 LAB — FERRITIN: Ferritin: 71 ng/mL (ref 24–336)

## 2018-08-22 NOTE — Telephone Encounter (Signed)
Oral Oncology Patient Advocate Encounter  Received notification from [facility] that prior authorization for Nicholas Lowery is required.  PA submitted on CoverMyMeds Key [id number] Status is pending  Oral Oncology Clinic will continue to follow.  [place] Patient [place] Phone [phone] Fax [phone] 08/22/2018    1:40 PM

## 2018-08-22 NOTE — Telephone Encounter (Signed)
Oral Oncology Pharmacist Encounter  Received new prescription for Stivarga (regorafenib) for the treatment of metastatic colon cancer, Kras mutation positive, planned duration until disease progression or unacceptable toxicity.  Original diagnosis in Dec 2016, when patient was noted with polyps during colonoscopy, they were removed with polypectomy, no further treatment at that time, unknown reason. Diagnosis of stage IV disease in Jan 2018 with noted liver metastasis. Patient was treated with FOLFOX + Avastin March-May then was changed to FOLFIRINOX + Avastin until Sept 2018 Xeloda -> 5-FU + Avastin for maintenance treatment Sept 2018-May 2019 Progression was noted on imaging and patient was started on FOLFIRI + Avastin May-Aug 2019 Progression again noted and treatment changed to FOLFOX + Avastin Aug -Oct 2019 Maintenance Xeloda + Avastin Nov-Dec 2019, stopped for enrollment in clinical trial Clinical trial at [facility] with palbociclib + binimetinib Jan-March 2020, discontinued secondary to progression.  Patient is now under evaluation to initiate treatment with regorafenib '80mg'$  by mouth once daily for 3 weeks on, 1 week off, repeated every 4 weeks plus nivolumab at '3mg'$ /kg IV q 2-4 weeks based on positive phase 1b REGONIVO trial data  Labs from 08/21/2018 assessed, OK for treatment initiation. BPs in Epic reviewed, noted narrow pulse pressure and high diastolic BP Stivarga is known to cause an increase in hypertension, will discuss with patient and MD  Current medication list in Epic reviewed, no DDIs with Stibarga identified.  Prescription has been e-scribed to the [facility] for benefits analysis and approval by MD.  Oral Oncology Clinic will continue to follow for insurance authorization, copayment issues, initial counseling and start date.  Johny Drilling, PharmD, BCPS, BCOP  08/22/2018 8:15 AM Oral Oncology Clinic [phone]

## 2018-08-22 NOTE — Telephone Encounter (Signed)
Scheduled appt per 4/8 los.   Not able to add any treatments to 4/20, patient was okay with 4/22.  Patient aware of appt.

## 2018-08-23 MED ORDER — REGORAFENIB 40 MG PO TABS: 80.0000 mg | ORAL_TABLET | Freq: Every day | ORAL | 2 refills | Status: DC

## 2018-08-23 NOTE — Telephone Encounter (Signed)
Oral Oncology Pharmacist Encounter  Received notification that insurance authorization for Nicholas Lowery has been approved. Medication must be dispensed from [facility] specialty pharmacy per insurance requirement. Stivarga prescription has been e-scribed to [facility] in [place], [place].  We will plan to follow-up to perform initial counseling early next week. Noted start date 09/04/18.  Johny Drilling, PharmD, BCPS, BCOP  08/23/2018 2:01 PM Oral Oncology Clinic [phone]

## 2018-08-23 NOTE — Telephone Encounter (Signed)
Oral Oncology Patient Advocate Encounter  Prior Authorization for Marylyn Ishihara has been approved.    PA# [id number] Effective dates: 08/22/18 through 08/22/19  Oral Oncology Clinic will continue to follow.   [place] Patient [place] Phone [phone] Fax [phone] 08/23/2018    8:22 AM

## 2018-08-26 NOTE — Telephone Encounter (Signed)
Oral Chemotherapy Pharmacist Encounter   Attempted to reach patient to provide update and offer for initial counseling on oral medication: Stivarga.  No answer. Left VM for patient to call back to discuss details of medication acquisition and initial counseling.   Johny Drilling, PharmD, BCPS, BCOP  08/26/2018   9:16 AM Oral Oncology Clinic [phone]

## 2018-08-27 NOTE — Telephone Encounter (Addendum)
Oral Chemotherapy Pharmacist Encounter   I spoke with patient for overview of: Stivarga (regorafenib) for the treatment of metastatic colon cancer, Kras mutation positive, in combination with Opdivo (nivolumab) planned duration until disease progression or unacceptable toxicity.  Counseled patient on administration, dosing, side effects, monitoring, drug-food interactions, safe handling, storage, and disposal.  Patient will take Stivarga 923m tablets, 2 tablets (820m by mouth once daily with a glass of water and a low fat meal of <600 calories, and <30% fat content.  Stivarga will be administered on a 3 week on, 1 week off schedule, repeated every 4 weeks. Opdivo will be administered at 23m14mg IV q 2-4 weeks  This is based on positive phase 1b REGONIVO trial data  Patient knows to avoid grapefruit and grapefruit juice while on therapy with Stivarga.  Stivarga start date: 09/04/2018  Adverse effects of Stivarga include but are not limited to: hand-foot syndrome, diarrhea, nausea, abdominal pain, musculoskeletal pain, fatigue, hypertension, delayed wound healing, decreased blood counts, and hepatotoxicity.    Adverse effects of Opdivo include but are not limited to: colitis, dermatitis, pneumonitis, thyroid dysfunction, and endocrinopathies.  We discussed patient's baseline hypertension, additional antihypertensives may be added if it worsens. Patient states he has changed lisinopril to losartan back in Feb 2020 due to dry cough. Medication list has been updated.  Patient has anti-emetic on hand and knows to take it if nausea develops.   Patient has anti diarrheal on hand and alert the office of 4 or more loose stools above baseline.  Reviewed with patient importance of keeping a medication schedule and plan for any missed doses.  Stivarga should be discontinued 2 weeks prior to any planned surgery and resumed postsurgery based on clinical judgement of wound healing.  Mr. CalFlaumiced  understanding and appreciation.   All questions answered. Medication reconciliation performed and medication/allergy list updated.  Insurance authorization for StiMarylyn Ishiharas been obtained. Stivarga must be filled at [facility] insurance requirement. Patient states he has not yet heard from pharmacy to coordinate medication acquisition. We will [facility] with [facility] in [facility] morning to check on medication status.  Patient knows to call the office with questions or concerns. Oral Oncology Clinic will continue to follow.  JesJohny DrillingharmD, BCPS, BCOP  08/27/2018   3:49 PM Oral Oncology Clinic [phone]

## 2018-08-27 NOTE — Telephone Encounter (Signed)
Oral Chemotherapy Pharmacist Encounter   Attempted to reach patient to provide update and offer for initial counseling on oral medication: Stivarga.  No answer. Left VM for patient to call back to discuss details of medication acquisition and initial counseling.   Johny Drilling, PharmD, BCPS, BCOP  08/27/2018  9:07 AM  Oral Oncology Clinic [phone]

## 2018-08-28 ENCOUNTER — Encounter: Payer: Self-pay | Admitting: Hematology

## 2018-08-28 ENCOUNTER — Other Ambulatory Visit: Payer: Self-pay | Admitting: Hematology

## 2018-08-28 ENCOUNTER — Telehealth: Payer: Self-pay

## 2018-08-28 MED ORDER — OXYCODONE HCL 5 MG PO TABS: 5.0000 mg | ORAL_TABLET | Freq: Four times a day (QID) | ORAL | 0 refills | Status: DC | PRN

## 2018-08-28 NOTE — Telephone Encounter (Signed)
Oral Oncology Pharmacist Encounter  Returned call to [facility] pharmacy to discuss dispense quantity for patient Stivarga prescription. Stivarga prescription written for quantity #42 tablets. Stivarga is dispensed in quantity #28 tablet packages. Pharmacy will dispense quantity #56 tablets at a time as they are unable to break the packages. Verbal approval given for tablet quantity change. They will update patient about dispense quantity when they reach out to him to schedule his Stivarga shipment.  Johny Drilling, PharmD, BCPS, BCOP  08/28/2018 3:18 PM Oral Oncology Clinic [phone]

## 2018-08-28 NOTE — Telephone Encounter (Signed)
Oral Oncology Pharmacist Encounter  I called [facility] pharmacy at [phone] to follow-up on status of patient Stivarga. Representative stated prescription was still in pharmacist verification stage. Patient should receive a call to schedule first shipment of Stivarga in the next 24-48 hours.  I called and left voicemail for patient with information above. I also left contact phone number for dispensing pharmacy and direct dial for oral oncology clinic if patient should have any additional questions or concerns.  Johny Drilling, PharmD, BCPS, BCOP  08/28/2018 10:21 AM Oral Oncology Clinic [phone]

## 2018-08-28 NOTE — Telephone Encounter (Signed)
Spoke with patient in response to My Chart message per [name] have sent in Oxycodone 5 mg take one to two every 6 hours for severe pain, sent to [facility], patient verbalized an understanding.

## 2018-08-29 ENCOUNTER — Encounter (HOSPITAL_COMMUNITY): Payer: Self-pay

## 2018-08-29 ENCOUNTER — Emergency Department (HOSPITAL_COMMUNITY)
Admission: EM | Admit: 2018-08-29 | Discharge: 2018-08-29 | Disposition: A | Discharge status: Home / Self Care | Payer: BLUE CROSS/BLUE SHIELD | Attending: Emergency Medicine | Admitting: Emergency Medicine

## 2018-08-29 ENCOUNTER — Other Ambulatory Visit: Payer: Self-pay

## 2018-08-29 ENCOUNTER — Emergency Department (HOSPITAL_COMMUNITY): Payer: BLUE CROSS/BLUE SHIELD

## 2018-08-29 ENCOUNTER — Encounter: Payer: Self-pay | Admitting: Hematology

## 2018-08-29 DIAGNOSIS — C182 Malignant neoplasm of ascending colon: Secondary | ICD-10-CM | POA: Diagnosis not present

## 2018-08-29 DIAGNOSIS — R1013 Epigastric pain: Secondary | ICD-10-CM | POA: Diagnosis present

## 2018-08-29 DIAGNOSIS — Z9221 Personal history of antineoplastic chemotherapy: Secondary | ICD-10-CM | POA: Insufficient documentation

## 2018-08-29 DIAGNOSIS — Z79899 Other long term (current) drug therapy: Secondary | ICD-10-CM | POA: Diagnosis not present

## 2018-08-29 DIAGNOSIS — Z87891 Personal history of nicotine dependence: Secondary | ICD-10-CM | POA: Insufficient documentation

## 2018-08-29 DIAGNOSIS — I10 Essential (primary) hypertension: Secondary | ICD-10-CM | POA: Insufficient documentation

## 2018-08-29 DIAGNOSIS — G893 Neoplasm related pain (acute) (chronic): Secondary | ICD-10-CM

## 2018-08-29 HISTORY — DX: Essential (primary) hypertension: I10

## 2018-08-29 LAB — CBC WITH DIFFERENTIAL/PLATELET
Abs Immature Granulocytes: 0.01 10*3/uL (ref 0.00–0.07)
Basophils Absolute: 0.1 10*3/uL (ref 0.0–0.1)
Basophils Relative: 1 %
Eosinophils Absolute: 0.1 10*3/uL (ref 0.0–0.5)
Eosinophils Relative: 2 %
HCT: 47.3 % (ref 39.0–52.0)
Hemoglobin: 15.9 g/dL (ref 13.0–17.0)
Immature Granulocytes: 0 %
Lymphocytes Relative: 15 %
Lymphs Abs: 1.1 10*3/uL (ref 0.7–4.0)
MCH: 33.1 pg (ref 26.0–34.0)
MCHC: 33.6 g/dL (ref 30.0–36.0)
MCV: 98.3 fL (ref 80.0–100.0)
Monocytes Absolute: 0.7 10*3/uL (ref 0.1–1.0)
Monocytes Relative: 10 %
Neutro Abs: 4.9 10*3/uL (ref 1.7–7.7)
Neutrophils Relative %: 72 %
Platelets: 249 10*3/uL (ref 150–400)
RBC: 4.81 MIL/uL (ref 4.22–5.81)
RDW: 13.8 % (ref 11.5–15.5)
WBC: 6.8 10*3/uL (ref 4.0–10.5)
nRBC: 0 % (ref 0.0–0.2)

## 2018-08-29 LAB — URINALYSIS, ROUTINE W REFLEX MICROSCOPIC
Bilirubin Urine: NEGATIVE
Glucose, UA: NEGATIVE mg/dL
Hgb urine dipstick: NEGATIVE
Ketones, ur: NEGATIVE mg/dL
Leukocytes,Ua: NEGATIVE
Nitrite: NEGATIVE
Protein, ur: NEGATIVE mg/dL
Specific Gravity, Urine: 1.008 (ref 1.005–1.030)
pH: 9 — ABNORMAL HIGH (ref 5.0–8.0)

## 2018-08-29 LAB — COMPREHENSIVE METABOLIC PANEL
ALT: 21 U/L (ref 0–44)
AST: 38 U/L (ref 15–41)
Albumin: 4.8 g/dL (ref 3.5–5.0)
Alkaline Phosphatase: 91 U/L (ref 38–126)
Anion gap: 14 (ref 5–15)
BUN: 8 mg/dL (ref 6–20)
CO2: 24 mmol/L (ref 22–32)
Calcium: 10.1 mg/dL (ref 8.9–10.3)
Chloride: 100 mmol/L (ref 98–111)
Creatinine, Ser: 1.05 mg/dL (ref 0.61–1.24)
GFR calc Af Amer: >60 mL/min (ref >60)
GFR calc non Af Amer: >60 mL/min (ref >60)
Glucose, Bld: 101 mg/dL — ABNORMAL HIGH (ref 70–99)
Potassium: 4 mmol/L (ref 3.5–5.1)
Sodium: 138 mmol/L (ref 135–145)
Total Bilirubin: 1.5 mg/dL — ABNORMAL HIGH (ref 0.3–1.2)
Total Protein: 8.1 g/dL (ref 6.5–8.1)

## 2018-08-29 LAB — LIPASE, BLOOD: Lipase: 24 U/L (ref 11–51)

## 2018-08-29 MED ORDER — SODIUM CHLORIDE 0.9 % IV BOLUS
1000.0000 mL | Freq: Once | INTRAVENOUS | Status: AC
  Administered 2018-08-29: 1000 mL via INTRAVENOUS

## 2018-08-29 MED ORDER — POLYETHYLENE GLYCOL 3350 17 G PO PACK: 17.0000 g | PACK | Freq: Every day | ORAL | 0 refills | Status: DC

## 2018-08-29 MED ORDER — HYDROMORPHONE HCL 1 MG/ML IJ SOLN
1.0000 mg | Freq: Once | INTRAMUSCULAR | Status: AC
  Administered 2018-08-29: 10:00:00 1 mg via INTRAVENOUS
  Filled 2018-08-29: qty 1

## 2018-08-29 MED ORDER — HYDROMORPHONE HCL 1 MG/ML IJ SOLN
1.0000 mg | Freq: Once | INTRAMUSCULAR | Status: AC
  Administered 2018-08-29: 1 mg via INTRAVENOUS
  Filled 2018-08-29: qty 1

## 2018-08-29 MED ORDER — IOPAMIDOL (ISOVUE-300) INJECTION 61%
100.0000 mL | Freq: Once | INTRAVENOUS | Status: AC | PRN
  Administered 2018-08-29: 11:00:00 100 mL via INTRAVENOUS

## 2018-08-29 MED ORDER — HYDROMORPHONE HCL 2 MG PO TABS: 2.0000 mg | ORAL_TABLET | ORAL | 0 refills | Status: DC | PRN

## 2018-08-29 NOTE — ED Notes (Signed)
Patient transported to CT 

## 2018-08-29 NOTE — ED Notes (Signed)
Discharge instructions and prescriptions discussed with Pt. Pt verbalized understanding. Pt stable and ambulatory.   

## 2018-08-29 NOTE — ED Triage Notes (Addendum)
Pt arrives POV from home c/o abdominal pain, and nausea for "about 3 weeks." Pt also reports new onset of SHOB for the past week. Pt has hx of colon cancer and has been going through clinical trials that started in January 2020.  Hx of colon resection surgery on 06/01/2016. PMH of cataracts, hernia repair, HTN. Pt also reports chills for the past "few weeks." Pt states he has not been around anyone positive for COVID that he is aware of.

## 2018-08-29 NOTE — ED Provider Notes (Signed)
[facility] DEPARTMENT Provider Note   CSN: [id number] Arrival date & time: 08/29/18  6761    History   Chief Complaint Chief Complaint  Patient presents with   Abdominal Pain   Nausea    HPI Nicholas Lowery is a 58 y.o. male.     The history is provided by the patient and medical records. No language interpreter was used.  Abdominal Pain     58 year old male with history of metastatic colon cancer presents ED for evaluation of abdominal pain.  Patient here with progressive worsening upper abdominal pain ongoing for the past 3 weeks.  Pain is persistent, sharp, achy, radiates towards his rib cage and endorse cramping.  Now the pain is significant that he has to take small short breathing to try to alleviate his pain.  Pain is not associate with fever or chills, no productive cough, no nausea or vomiting or diarrhea.  He endorsed early satiety.  He acknowledged that he has metastatic colorectal cancer currently undergoing palliative therapy.  He has seen his oncologist, Dr. Annamaria Boots a week ago for this pain.  He was initially taking tramadol but then switch over to Norco.  Pain still is not well controlled and he reached out to his oncologist again 2 days ago.  He is now taking oxycodone around-the-clock without adequate relief.  He report having an x-ray of his abdomen on 08/21/18 to rule out bowel obstruction which was normal.  Patient however concerned that the pain may be more than just cancer progression and is requesting for additional imaging.  He endorsed depressed and felt if his pain is not adequately controlled he does not know what to do.  He knows he has 6 to 12 months to live but would like to increase that odd as best as possible.  Past Medical History:  Diagnosis Date   Anxiety    Cancer of ascending colon ([place])    Seasonal allergies     Patient Active Problem List   Diagnosis Date Noted   Peripheral neuropathy due to chemotherapy  ([place]) 12/13/2017   HTN (hypertension) 09/06/2016   Port catheter in place 08/10/2016   Iron deficiency anemia due to chronic blood loss 07/05/2016   Goals of care, counseling/discussion 07/05/2016   Diarrhea 06/06/2016   Elevated blood pressure reading 06/06/2016   Metastatic colon cancer to liver ([place]) 06/01/2016    Past Surgical History:  Procedure Laterality Date   APPENDECTOMY  1992   CATARACT EXTRACTION W/ INTRAOCULAR LENS IMPLANT Left 04/2016   COLON SURGERY     COLONOSCOPY W/ BIOPSIES AND POLYPECTOMY  04/2015   INGUINAL HERNIA REPAIR Right 1982   IR RADIOLOGIST EVAL & MGMT  01/16/2017   KNEE ARTHROSCOPY W/ MENISCECTOMY Right 1998   LAPAROSCOPIC RIGHT COLECTOMY N/A 06/14/2016   Procedure: LAPAROSCOPIC HAND ASSISTED HEMICOLECTOMY AND SMALL BOWEL RESECTION.;  Surgeon: [name], MD;  Location: [facility];  Service: General;  Laterality: N/A;   LIVER BIOPSY Right 06/14/2016   Procedure: LIVER BIOPSY;  Surgeon: [name], MD;  Location: [facility];  Service: General;  Laterality: Right;  Right Inferior Liver   PORTACATH PLACEMENT N/A 07/06/2016   Procedure: INSERTION PORT-A-CATH;  Surgeon: [name], MD;  Location: [facility];  Service: General;  Laterality: N/A;   VASECTOMY  2005        Home Medications    Prior to Admission medications   Medication Sig Start Date End Date Taking? Authorizing Provider  amLODipine (NORVASC) 10 MG  tablet Take 1 tablet (10 mg total) by mouth daily. 03/21/18   Truitt Merle, MD  Avanafil (STENDRA) 200 MG TABS     [provider]  celecoxib (CELEBREX) 200 MG capsule Take 1 capsule (200 mg total) by mouth daily. 06/03/18   Truitt Merle, MD  diphenoxylate-atropine (LOMOTIL) 2.5-0.025 MG tablet Take 2 tablets by mouth 4 (four) times daily as needed for diarrhea or loose stools. 12/29/16   Truitt Merle, MD  HYDROcodone-acetaminophen (NORCO) 5-325 MG tablet Take 1 tablet by mouth every 6 (six) hours as needed for moderate pain.  08/21/18   Truitt Merle, MD  hydrocortisone 2.5 % cream APPLY TO SCALY AREAS ON FACE AT BEDTIME 06/04/18   [provider]  ketoconazole (NIZORAL) 2 % cream APPLY TO SCALY AREAS ON FACE EVERY MORNING 06/04/18   [provider]  lidocaine (XYLOCAINE) 2 % solution Use as directed 5 mLs in the mouth or throat every 3 (three) hours as needed for mouth pain. 06/17/18   Tanner, Lyndon Code., PA-C  lidocaine-prilocaine (EMLA) cream apply ONE gram topically AS NEEDED 05/16/18   [provider]  LORazepam (ATIVAN) 0.5 MG tablet Take 1 tablet (0.5 mg total) by mouth every 8 (eight) hours as needed (for nausea). 06/03/18   Truitt Merle, MD  losartan (COZAAR) 50 MG tablet Take 50 mg by mouth daily.    [provider]  magic mouthwash SOLN Take 5 mLs by mouth 4 (four) times daily as needed for mouth pain. 06/17/18   Harle Stanford., PA-C  Multiple Vitamin (MULTIVITAMIN WITH MINERALS) TABS tablet Take 1 tablet by mouth daily.    [provider]  omeprazole (PRILOSEC) 20 MG capsule TAKE ONE CAPSULE BY MOUTH TWICE DAILY BEFORE A MEAL 08/14/18   Truitt Merle, MD  ondansetron (ZOFRAN ODT) 8 MG disintegrating tablet Take 1 tablet (8 mg total) by mouth every 8 (eight) hours as needed for nausea or vomiting. 01/18/17   Truitt Merle, MD  oxyCODONE (OXY IR/ROXICODONE) 5 MG immediate release tablet Take 1-2 tablets (5-10 mg total) by mouth every 6 (six) hours as needed for severe pain. 08/28/18   Truitt Merle, MD  prochlorperazine (COMPAZINE) 10 MG tablet Take 1 tablet (10 mg total) by mouth every 6 (six) hours as needed for nausea or vomiting. 06/16/17   Truitt Merle, MD  regorafenib (STIVARGA) 40 MG tablet Take 2 tablets (80 mg total) by mouth daily. Take for 3 weeks on, 1 week off, repeat every 4 weeks. Take with a low fat and low calorie meal 08/23/18   Truitt Merle, MD  sildenafil (REVATIO) 20 MG tablet TAKE 3-5 TABLETS BY MOUTH AT least 30 MINUTES prior TO sex 01/19/18   [provider]  traMADol (ULTRAM) 50 MG  tablet Take 1 tablet (50 mg total) by mouth every 6 (six) hours as needed. 07/12/18   Truitt Merle, MD  urea (CARMOL) 10 % cream Apply topically 3 (three) times daily. 03/15/17   Alla Feeling, NP  venlafaxine XR (EFFEXOR-XR) 150 MG 24 hr capsule TAKE ONE CAPSULE BY MOUTH DAILY WITH BREAKFAST 04/22/18   Truitt Merle, MD    Family History Family History  Problem Relation Age of Onset   Cancer Mother        lung cancer   Stroke Mother    Hypertension Father    CAD Father    Cancer Maternal Grandfather        prostate cancer     Social History Social History   Tobacco  Use   Smoking status: Former Smoker    Years: 2.00    Types: Cigarettes    Last attempt to quit: 1990    Years since quitting: 30.3   Smokeless tobacco: Never Used  Substance Use Topics   Alcohol use: Yes    Alcohol/week: 4.0 standard drinks    Types: 4 Cans of beer per week    Comment: social, none since colon surgery   Drug use: No    Comment: 06/15/2016 "nothing since college"     Allergies   Penicillins and Xeloda [capecitabine]   Review of Systems Review of Systems  Gastrointestinal: Positive for abdominal pain.  All other systems reviewed and are negative.    Physical Exam Updated Vital Signs BP (!) 151/120    Pulse (!) 113    Temp 98.3 F (36.8 C) (Oral)    Resp (!) 21    Ht 5\' 10"  (1.778 m)    Wt 86.2 kg    SpO2 100%    BMI 27.26 kg/m   Physical Exam Vitals signs and nursing note reviewed.  Constitutional:      General: He is not in acute distress.    Appearance: He is well-developed.  HENT:     Head: Atraumatic.  Eyes:     Conjunctiva/sclera: Conjunctivae normal.  Neck:     Musculoskeletal: Neck supple.  Cardiovascular:     Rate and Rhythm: Tachycardia present.  Pulmonary:     Effort: Pulmonary effort is normal.     Breath sounds: Normal breath sounds.  Abdominal:     General: Abdomen is flat. Bowel sounds are decreased.     Palpations: Abdomen is soft.     Tenderness: There  is abdominal tenderness in the epigastric area.  Skin:    Findings: No rash.  Neurological:     Mental Status: He is alert and oriented to person, place, and time.  Psychiatric:        Mood and Affect: Mood is anxious and depressed.      ED Treatments / Results  Labs (all labs ordered are listed, but only abnormal results are displayed) Labs Reviewed  COMPREHENSIVE METABOLIC PANEL - Abnormal; Notable for the following components:      Result Value   Glucose, Bld 101 (*)    Total Bilirubin 1.5 (*)    All other components within normal limits  URINALYSIS, ROUTINE W REFLEX MICROSCOPIC - Abnormal; Notable for the following components:   pH 9.0 (*)    All other components within normal limits  CBC WITH DIFFERENTIAL/PLATELET  LIPASE, BLOOD    EKG None  Radiology Ct Abdomen Pelvis W Contrast  Result Date: 08/29/2018 CLINICAL DATA:  Abdominal pain, metastatic colon cancer EXAM: CT ABDOMEN AND PELVIS WITH CONTRAST TECHNIQUE: Multidetector CT imaging of the abdomen and pelvis was performed using the standard protocol following bolus administration of intravenous contrast. CONTRAST:  128mL ISOVUE-300 IOPAMIDOL (ISOVUE-300) INJECTION 61% COMPARISON:  CT chest abdomen pelvis, 03/19/2018 FINDINGS: Lower chest: There is a small although increased right pleural effusion. Multiple pulmonary nodules in the included bilateral lung bases, several of which are new and enlarged compared to prior examination (e.g. Series 4, image 1). Hepatobiliary: Interval enlargement of a hypodense lesion of the inferior margin of the right lobe of the liver, hepatic segment VI (series 3, image 33). No gallstones, gallbladder wall thickening, or biliary dilatation. Pancreas: Unremarkable. No pancreatic ductal dilatation or surrounding inflammatory changes. Spleen: Normal in size without focal abnormality. Accessory splenule. Adrenals/Urinary Tract: Adrenal  glands are unremarkable. Kidneys are normal, without renal  calculi, focal lesion, or hydronephrosis. Bladder is unremarkable. Stomach/Bowel: Stomach is within normal limits. No evidence of bowel wall thickening, distention, or inflammatory changes. Evidence of prior right colon resection with adjacent mesenteric nodularity increased compared to prior examination (series 3, image 41). Increasing peritoneal nodularity about the umbilicus and anterior peritoneum (series 3, image 47, 32). Moderate burden of stool in the colon. Vascular/Lymphatic: No significant vascular findings are present. No enlarged abdominal or pelvic lymph nodes. Reproductive: No mass or other abnormality. Other: No abdominal wall hernia or abnormality. Trace perihepatic ascites. Musculoskeletal: No acute or significant osseous findings. IMPRESSION: 1. No acute CT findings of the abdomen or pelvis to explain abdominal pain. 2. Evidence of worsening metastatic colon malignancy status post right colon resection, including increasing mesenteric nodularity, increasing peritoneal nodularity, enlarging liver metastasis, and new and enlarging pulmonary nodules. 3. Trace perihepatic ascites, of uncertain etiology, although presumably malignant given peritoneal metastatic disease. Electronically Signed   By: Eddie Candle M.D.   On: 08/29/2018 12:00    Procedures Procedures (including critical care time)  Medications Ordered in ED Medications  HYDROmorphone (DILAUDID) injection 1 mg (1 mg Intravenous Given 08/29/18 0959)  sodium chloride 0.9 % bolus 1,000 mL (0 mLs Intravenous Stopped 08/29/18 1125)  iopamidol (ISOVUE-300) 61 % injection 100 mL (100 mLs Intravenous Contrast Given 08/29/18 1119)  HYDROmorphone (DILAUDID) injection 1 mg (1 mg Intravenous Given 08/29/18 1227)     Initial Impression / Assessment and Plan / ED Course  I have reviewed the triage vital signs and the nursing notes.  Pertinent labs & imaging results that were available during my care of the patient were reviewed by me and  considered in my medical decision making (see chart for details).        BP (!) 164/121    Pulse (!) 104    Temp 98.3 F (36.8 C) (Oral)    Resp 11    Ht 5\' 10"  (1.778 m)    Wt 86.2 kg    SpO2 99%    BMI 27.26 kg/m    Final Clinical Impressions(s) / ED Diagnoses   Final diagnoses:  Cancer associated pain    ED Discharge Orders         Ordered    HYDROmorphone (DILAUDID) 2 MG tablet  Every 4 hours PRN     08/29/18 1313    polyethylene glycol (MIRALAX / GLYCOLAX) 17 g packet  Daily     08/29/18 1313         9:53 AM Patient with history of colorectal cancer here with progressive worsening of abdominal pain.  He has been receiving pain medication transition from tramadol to hydrocodone not Percocet without adequate relief.  He notes no shortness of breath secondary to worsening pain.  Labs are reassuring.  Abdominal pelvis CT scan demonstrates new and enlarging pulmonary nodule with worsening of his malignancy in the abdomen and pelvis.  This is likely the source of his pain.  Appreciate consultation with on-call oncologist, [name] who agrees with escalating pain management.  If he needs to be admitted for pain control with that would be reasonable.  Otherwise, he can be discharged home with Dilaudid 2 mg every 4 hours along with stool softener.  1:10 PM Pt would like to manage his pain at home.  He will f/u closely with oncologist for further management.  I encourage using stool softener while taking opiate, and pt made aware that the medication  can cause drowsiness.  Pt understand to return if condition worsen. Care discussed with Dr. Vanita Panda.   Domenic Moras, PA-C 08/29/18 1314    Carmin Muskrat, MD 08/30/18 [phone]

## 2018-08-29 NOTE — Discharge Instructions (Signed)
Please take dilaudid as prescribed for management of your pain.  Call and follow up closely with your oncologist next week for further care.  Return if your condition worsen or if you have other concerns.

## 2018-08-29 NOTE — ED Notes (Signed)
ED Provider at bedside. 

## 2018-08-30 ENCOUNTER — Encounter (HOSPITAL_COMMUNITY): Payer: Self-pay | Admitting: Emergency Medicine

## 2018-08-30 ENCOUNTER — Inpatient Hospital Stay (HOSPITAL_COMMUNITY)
Admission: EM | Admit: 2018-08-30 | Discharge: 2018-09-03 | DRG: 948 | Disposition: A | Discharge status: Home / Self Care | Payer: BLUE CROSS/BLUE SHIELD | Attending: Internal Medicine | Admitting: Internal Medicine

## 2018-08-30 ENCOUNTER — Telehealth: Payer: Self-pay | Admitting: Hematology

## 2018-08-30 ENCOUNTER — Other Ambulatory Visit: Payer: Self-pay

## 2018-08-30 DIAGNOSIS — G620 Drug-induced polyneuropathy: Secondary | ICD-10-CM | POA: Diagnosis present

## 2018-08-30 DIAGNOSIS — Z79899 Other long term (current) drug therapy: Secondary | ICD-10-CM

## 2018-08-30 DIAGNOSIS — I10 Essential (primary) hypertension: Secondary | ICD-10-CM | POA: Diagnosis not present

## 2018-08-30 DIAGNOSIS — Z7189 Other specified counseling: Secondary | ICD-10-CM | POA: Diagnosis not present

## 2018-08-30 DIAGNOSIS — R630 Anorexia: Secondary | ICD-10-CM | POA: Diagnosis present

## 2018-08-30 DIAGNOSIS — C189 Malignant neoplasm of colon, unspecified: Secondary | ICD-10-CM | POA: Diagnosis not present

## 2018-08-30 DIAGNOSIS — C19 Malignant neoplasm of rectosigmoid junction: Secondary | ICD-10-CM | POA: Diagnosis present

## 2018-08-30 DIAGNOSIS — G893 Neoplasm related pain (acute) (chronic): Secondary | ICD-10-CM | POA: Diagnosis not present

## 2018-08-30 DIAGNOSIS — F411 Generalized anxiety disorder: Secondary | ICD-10-CM | POA: Diagnosis present

## 2018-08-30 DIAGNOSIS — C787 Secondary malignant neoplasm of liver and intrahepatic bile duct: Secondary | ICD-10-CM | POA: Diagnosis present

## 2018-08-30 DIAGNOSIS — C801 Malignant (primary) neoplasm, unspecified: Secondary | ICD-10-CM | POA: Diagnosis present

## 2018-08-30 DIAGNOSIS — Z515 Encounter for palliative care: Secondary | ICD-10-CM

## 2018-08-30 DIAGNOSIS — Z880 Allergy status to penicillin: Secondary | ICD-10-CM

## 2018-08-30 DIAGNOSIS — Z9049 Acquired absence of other specified parts of digestive tract: Secondary | ICD-10-CM

## 2018-08-30 DIAGNOSIS — K5900 Constipation, unspecified: Secondary | ICD-10-CM | POA: Diagnosis present

## 2018-08-30 DIAGNOSIS — F064 Anxiety disorder due to known physiological condition: Secondary | ICD-10-CM | POA: Diagnosis present

## 2018-08-30 DIAGNOSIS — Z801 Family history of malignant neoplasm of trachea, bronchus and lung: Secondary | ICD-10-CM

## 2018-08-30 DIAGNOSIS — R252 Cramp and spasm: Secondary | ICD-10-CM | POA: Diagnosis present

## 2018-08-30 DIAGNOSIS — R1084 Generalized abdominal pain: Secondary | ICD-10-CM

## 2018-08-30 DIAGNOSIS — G4700 Insomnia, unspecified: Secondary | ICD-10-CM | POA: Diagnosis present

## 2018-08-30 DIAGNOSIS — N179 Acute kidney failure, unspecified: Secondary | ICD-10-CM | POA: Diagnosis present

## 2018-08-30 DIAGNOSIS — C786 Secondary malignant neoplasm of retroperitoneum and peritoneum: Secondary | ICD-10-CM | POA: Diagnosis present

## 2018-08-30 DIAGNOSIS — F418 Other specified anxiety disorders: Secondary | ICD-10-CM | POA: Diagnosis not present

## 2018-08-30 DIAGNOSIS — Z66 Do not resuscitate: Secondary | ICD-10-CM | POA: Diagnosis present

## 2018-08-30 DIAGNOSIS — F329 Major depressive disorder, single episode, unspecified: Secondary | ICD-10-CM | POA: Diagnosis present

## 2018-08-30 DIAGNOSIS — Z6827 Body mass index (BMI) 27.0-27.9, adult: Secondary | ICD-10-CM

## 2018-08-30 DIAGNOSIS — Z888 Allergy status to other drugs, medicaments and biological substances status: Secondary | ICD-10-CM

## 2018-08-30 DIAGNOSIS — Z8042 Family history of malignant neoplasm of prostate: Secondary | ICD-10-CM

## 2018-08-30 DIAGNOSIS — R188 Other ascites: Secondary | ICD-10-CM | POA: Diagnosis present

## 2018-08-30 DIAGNOSIS — Z87891 Personal history of nicotine dependence: Secondary | ICD-10-CM

## 2018-08-30 DIAGNOSIS — T451X5A Adverse effect of antineoplastic and immunosuppressive drugs, initial encounter: Secondary | ICD-10-CM | POA: Diagnosis present

## 2018-08-30 DIAGNOSIS — K30 Functional dyspepsia: Secondary | ICD-10-CM | POA: Diagnosis present

## 2018-08-30 LAB — CBC WITH DIFFERENTIAL/PLATELET
Abs Immature Granulocytes: 0.01 10*3/uL (ref 0.00–0.07)
Basophils Absolute: 0 10*3/uL (ref 0.0–0.1)
Basophils Relative: 1 %
Eosinophils Absolute: 0.1 10*3/uL (ref 0.0–0.5)
Eosinophils Relative: 1 %
HCT: 42.6 % (ref 39.0–52.0)
Hemoglobin: 14.1 g/dL (ref 13.0–17.0)
Immature Granulocytes: 0 %
Lymphocytes Relative: 14 %
Lymphs Abs: 1.1 10*3/uL (ref 0.7–4.0)
MCH: 32.9 pg (ref 26.0–34.0)
MCHC: 33.1 g/dL (ref 30.0–36.0)
MCV: 99.5 fL (ref 80.0–100.0)
Monocytes Absolute: 1.1 10*3/uL — ABNORMAL HIGH (ref 0.1–1.0)
Monocytes Relative: 13 %
Neutro Abs: 5.8 10*3/uL (ref 1.7–7.7)
Neutrophils Relative %: 71 %
Platelets: 220 10*3/uL (ref 150–400)
RBC: 4.28 MIL/uL (ref 4.22–5.81)
RDW: 14 % (ref 11.5–15.5)
WBC: 8.1 10*3/uL (ref 4.0–10.5)
nRBC: 0 % (ref 0.0–0.2)

## 2018-08-30 LAB — URINALYSIS, ROUTINE W REFLEX MICROSCOPIC
Bilirubin Urine: NEGATIVE
Glucose, UA: NEGATIVE mg/dL
Hgb urine dipstick: NEGATIVE
Ketones, ur: 5 mg/dL — AB
Leukocytes,Ua: NEGATIVE
Nitrite: NEGATIVE
Protein, ur: 100 mg/dL — AB
Specific Gravity, Urine: 1.026 (ref 1.005–1.030)
pH: 5 (ref 5.0–8.0)

## 2018-08-30 LAB — COMPREHENSIVE METABOLIC PANEL
ALT: 21 U/L (ref 0–44)
AST: 24 U/L (ref 15–41)
Albumin: 4.2 g/dL (ref 3.5–5.0)
Alkaline Phosphatase: 74 U/L (ref 38–126)
Anion gap: 13 (ref 5–15)
BUN: 12 mg/dL (ref 6–20)
CO2: 25 mmol/L (ref 22–32)
Calcium: 9.9 mg/dL (ref 8.9–10.3)
Chloride: 101 mmol/L (ref 98–111)
Creatinine, Ser: 1.39 mg/dL — ABNORMAL HIGH (ref 0.61–1.24)
GFR calc Af Amer: >60 mL/min (ref >60)
GFR calc non Af Amer: 57 mL/min — ABNORMAL LOW (ref >60)
Glucose, Bld: 114 mg/dL — ABNORMAL HIGH (ref 70–99)
Potassium: 4 mmol/L (ref 3.5–5.1)
Sodium: 139 mmol/L (ref 135–145)
Total Bilirubin: 1.1 mg/dL (ref 0.3–1.2)
Total Protein: 7.5 g/dL (ref 6.5–8.1)

## 2018-08-30 LAB — LIPASE, BLOOD: Lipase: 26 U/L (ref 11–51)

## 2018-08-30 MED ORDER — HYDROMORPHONE HCL 1 MG/ML IJ SOLN
1.0000 mg | INTRAMUSCULAR | Status: DC | PRN
  Administered 2018-08-31: 1 mg via INTRAVENOUS
  Filled 2018-08-30: qty 1

## 2018-08-30 MED ORDER — PANTOPRAZOLE SODIUM 40 MG PO TBEC
40.0000 mg | DELAYED_RELEASE_TABLET | Freq: Every day | ORAL | Status: DC
  Administered 2018-08-31 – 2018-09-03 (×4): 40 mg via ORAL
  Filled 2018-08-30 (×4): qty 1

## 2018-08-30 MED ORDER — HYDROMORPHONE HCL 2 MG PO TABS: 4.0000 mg | ORAL_TABLET | ORAL | Status: DC | PRN

## 2018-08-30 MED ORDER — LORAZEPAM 1 MG PO TABS
1.0000 mg | ORAL_TABLET | ORAL | Status: DC | PRN
  Administered 2018-08-31 – 2018-09-01 (×3): 1 mg via ORAL
  Filled 2018-08-30 (×3): qty 1

## 2018-08-30 MED ORDER — SODIUM CHLORIDE 0.9 % IV SOLN
1000.0000 mL | Freq: Once | INTRAVENOUS | Status: AC
  Administered 2018-08-30: 14:00:00 1000 mL via INTRAVENOUS

## 2018-08-30 MED ORDER — SENNA 8.6 MG PO TABS
1.0000 | ORAL_TABLET | Freq: Every day | ORAL | Status: DC
  Administered 2018-08-30 – 2018-08-31 (×2): 8.6 mg via ORAL
  Filled 2018-08-30 (×2): qty 1

## 2018-08-30 MED ORDER — ENOXAPARIN SODIUM 40 MG/0.4ML ~~LOC~~ SOLN
40.0000 mg | SUBCUTANEOUS | Status: DC
  Administered 2018-08-30 – 2018-09-02 (×2): 40 mg via SUBCUTANEOUS
  Filled 2018-08-30 (×3): qty 0.4

## 2018-08-30 MED ORDER — ONDANSETRON HCL 4 MG/2ML IJ SOLN: 4.0000 mg | Freq: Four times a day (QID) | INTRAMUSCULAR | Status: DC | PRN

## 2018-08-30 MED ORDER — ADULT MULTIVITAMIN W/MINERALS CH
1.0000 | ORAL_TABLET | Freq: Every day | ORAL | Status: DC
  Administered 2018-08-30 – 2018-09-03 (×5): 1 via ORAL
  Filled 2018-08-30 (×5): qty 1

## 2018-08-30 MED ORDER — ONDANSETRON HCL 4 MG/2ML IJ SOLN
4.0000 mg | Freq: Once | INTRAMUSCULAR | Status: AC
  Administered 2018-08-30: 14:00:00 4 mg via INTRAVENOUS
  Filled 2018-08-30: qty 2

## 2018-08-30 MED ORDER — VENLAFAXINE HCL ER 150 MG PO CP24
150.0000 mg | ORAL_CAPSULE | Freq: Every day | ORAL | Status: DC
  Administered 2018-08-31 – 2018-09-03 (×4): 150 mg via ORAL
  Filled 2018-08-30 (×5): qty 1

## 2018-08-30 MED ORDER — LORAZEPAM 1 MG PO TABS
1.0000 mg | ORAL_TABLET | ORAL | Status: AC
  Administered 2018-08-30: 1 mg via SUBLINGUAL
  Filled 2018-08-30: qty 1

## 2018-08-30 MED ORDER — AMLODIPINE BESYLATE 10 MG PO TABS
10.0000 mg | ORAL_TABLET | Freq: Every day | ORAL | Status: DC
  Administered 2018-08-31 – 2018-09-03 (×4): 10 mg via ORAL
  Filled 2018-08-30 (×4): qty 1

## 2018-08-30 MED ORDER — TRAZODONE HCL 50 MG PO TABS
25.0000 mg | ORAL_TABLET | Freq: Every day | ORAL | Status: DC
  Administered 2018-08-30 – 2018-08-31 (×2): 25 mg via ORAL
  Filled 2018-08-30 (×2): qty 1

## 2018-08-30 MED ORDER — LOSARTAN POTASSIUM 50 MG PO TABS
50.0000 mg | ORAL_TABLET | Freq: Every day | ORAL | Status: DC
  Administered 2018-08-31 – 2018-09-02 (×3): 50 mg via ORAL
  Filled 2018-08-30 (×3): qty 1

## 2018-08-30 MED ORDER — POLYETHYLENE GLYCOL 3350 17 G PO PACK
17.0000 g | PACK | Freq: Every day | ORAL | Status: DC
  Administered 2018-08-30 – 2018-09-03 (×5): 17 g via ORAL
  Filled 2018-08-30 (×5): qty 1

## 2018-08-30 MED ORDER — HYDROMORPHONE HCL 1 MG/ML IJ SOLN
1.0000 mg | Freq: Once | INTRAMUSCULAR | Status: AC
  Administered 2018-08-30: 14:00:00 1 mg via INTRAVENOUS
  Filled 2018-08-30: qty 1

## 2018-08-30 MED ORDER — HYDROMORPHONE HCL 1 MG/ML IJ SOLN: 1.0000 mg | INTRAMUSCULAR | Status: DC | PRN

## 2018-08-30 MED ORDER — SENNOSIDES 8.6 MG PO TABS: 1.0000 | ORAL_TABLET | Freq: Every day | ORAL | Status: DC | PRN

## 2018-08-30 NOTE — Telephone Encounter (Signed)
I called patient this morning to follow-up how he is doing at home.  I sent him to the [facility] emergency room yesterday for intractable pain, and he was discharged with oral Dilaudid.  Patient is overwhelmed, extremely anxious, and depressed, he took oxycodone 2-3 times last night, but his pain did not get enough relief.  Per his wife, he is very concerned about narcotic addiction, and has not used Dilaudid.  Due to his intractable pain from his peritoneal metastasis, anxiety/depression crisis, I recommend hospital admission for management. He agrees. I spoke with hospitalist Dr. Cruzita Lederer at [facility], who recommends pt comes in to ED first for initial evaluation, and he will admit after. He will also consult palliative care for his pain and anxiety management. I spoke with his wife Tye [place] and she agrees with the plan, and will bring him in now.  Truitt Merle  08/30/2018

## 2018-08-30 NOTE — ED Triage Notes (Signed)
Pt has stage 4 cancer. Pt here due to pain. Pt states the meds he has received are not helping and needs something to help with his pain/sleep cycle.

## 2018-08-30 NOTE — Progress Notes (Signed)
HEMATOLOGY-ONCOLOGY PROGRESS NOTE  SUBJECTIVE: [name] is a 58 year old male with stage IV colorectal cancer.  He was last seen in our office on 08/21/2018.  He has had disease progression and the plan was to start him on Ragorafinib and Nivolumab next week.  He has been having increased abdominal pain.  He was seen in the emergency room yesterday.  A CT scan of the abdomen pelvis with contrast was performed which showed no acute findings to explain his abdominal pain but there was evidence of worsening metastatic cancer including increasing mesenteric nodularity, increasing peritoneal nodularity, enlarging liver metastasis, and new and enlarging pulmonary nodules.  He was also found to have trace perihepatic ascites.  He was given oral hydromorphone and discharged home.  Oncology contacted him at home today and he reported being overwhelmed, extremely anxious, depressed.  He was taking multiple pain pills without significant pain relief.  He reports that he could not sleep last night.  He was advised to come to the emergency room for admission.  The patient was seen in the emergency room today.  He reports abdominal pain that was not responding to oral medications.  He has received 1 dose of Dilaudid in the emergency room which has helped his pain some but has caused him to have vomiting.  Denies fevers and chills.  Denies chest pain and shortness of breath.  He reports that it has been several days since he had a bowel movement.  Denies bleeding. The patient is being admitted to [facility] for pain control.   Oncology History   Cancer Staging Metastatic colon cancer to liver [facility]) Staging form: Colon and Rectum, AJCC 8th Edition - Clinical stage from 06/01/2016: Stage IVA (cTX, cNX, pM1a) - Signed by Truitt Merle, MD on 07/04/2016 - Pathologic stage from 06/14/2016: Stage IVA (pT4b(m), pN2b, pM1a) - Signed by Truitt Merle, MD on 07/04/2016       Metastatic colon cancer to liver [facility])   04/2015 Procedure     Colonoscopy by Dr. Ladona Horns. It showed showed 2 sessile polyps ready between 3-5 mm in size located 20 cm (A, B) from the point of entry, polypectomy was performed. Pedunculated polyp was found in the ascending colon (C), polypectomy was performed, and additional polyp (D) was found 30 cm from the point of entry, removed    04/2015 Pathology Results    tubular adenoma (A and B), and well differentiated adenocarcinoma arising from tubulovillous adenoma (C) and well differentiated adenocarcinoma arising from severe dysplasia to intramucosal carcinoma within tubular adenoma.     04/2015 Initial Diagnosis    Metastatic colon cancer to liver ([place])    05/29/2016 Imaging    CT abdomen and pelvis with contrast showed an apple core like stricture in right colon just above the cecum, measuring 3.2 cm in lengths. This is highly suspicious for malignancy. Small lymph node a noticed he had adjacent mesentery, measuring 8 mm. There is a low-density lesion within the inferior aspect of the right hepatic lobe measuring 1.7 cm, suspicious for metastasis.     06/06/2016 Tumor Marker    CEA 9.99    06/08/2016 PET scan    IMPRESSION: Approximately 3 cm hypermetabolic mass in the ascending colon, consistent with primary colon carcinoma. This mass results in colonic obstruction and small bowel dilatation. Additional areas of hypermetabolic wall thickening in the cecum may represent other sites of colon carcinoma or colitis. Mild hypermetabolic lymphadenopathy in right pericolonic region, porta hepatis, and aortocaval space, consistent with metastatic disease.  Mild hypermetabolic mediastinal lymphadenopathy also seen, and thoracic lymph node metastases cannot be excluded. Solitary hypermetabolic focus in inferior right hepatic lobe, consistent with liver metastasis. Consider abdomen MRI without and with contrast for further evaluation.    06/14/2016 Surgery    Hand assisted right hemicolectomy and small bowel  resection for colon cancer, liver biopsy, by Dr. Barry Dienes    06/14/2016 Pathology Results    Right hemicolectomy showed invasive well to moderately differentiated adenocarcinoma, 2 foci measuring 7.5 cm and 4.5 cm, tumor invades through full thickness of colon, to the seroma and involve the Small Bowel, Surgical Margins Were Negative, 24 Out Of 64 Lymph Nodes Were Positive, Extracapsular Extension Identified, Multiple Satellite Tumor Deposits Present, Liver Biopsy Showed Metastatic Adenocarcinoma.      06/14/2016 Miscellaneous    Tumor MMR normal, MSI stable     06/14/2016 Miscellaneous    Foundation one genomic testing showed K-ras G12 D mutation, APC and TP53 mutation. No BRAF and NRAS mutation. MSI-stable, tumor burden low.    07/06/2016 Tumor Marker    CEA 13.69    07/13/2016 - 01/18/2017 Chemotherapy    mFOLFOX, every 2 weeks, started on 07/14/2015, Avastin added from cycle 3  Oxaliplatin dose to 32m/m2 due to side effects and some cytopenia on 09/21/16  Changed to FOLFIRINOX starting cycle 7 and Reduced  Due to neuropathy hold Oxaliplatin and add Irinotecan with neulasta on day 3 starting with cycle 7 Add low dose Oxaliplatin with cycle 8.  Due to his worsening neuropathy, and good response to chemotherapy, I previously stopped oxaliplatin from cycle 11, and continue FOLFIRI and avastin       07/27/2016 Tumor Marker    CEA 15.85    09/04/2016 Imaging    Ct C/A/P W Contrast IMPRESSION: Interval right colectomy. Stable small liver metastasis in the inferior right hepatic lobe. Stable mild porta hepatis and aortocaval lymphadenopathy. Stable mild mediastinal lymphadenopathy. No new or progressive metastatic disease identified within the chest, abdomen, or pelvis.    12/19/2016 PET scan    IMPRESSION: 1. Right hemicolectomy, with resolution of the prior hypermetabolic activity inferiorly in the right hepatic lobe, in several mediastinal lymph nodes, and in lymph nodes in the  retroperitoneum and porta hepatis. No residual hypermetabolic or enlarged lymph nodes are identified. 2. Low-grade diffuse skeletal metabolic activity is likely therapy related. 3. Coronary atherosclerosis. 4. 3 by 4 mm right middle lobe pulmonary nodule is stable, not appreciably hypermetabolic, but below sensitive PET-CT size thresholds. This may warrant surveillance.    01/11/2017 Imaging    MR Abdomen W WO Contrast IMPRESSION: 1. No acute findings within the abdomen. Previously noted liver metastasis has resolved in the interval. No new lesions.    02/01/2017 - 09/2017 Chemotherapy    Maintenance therapy, Xeloda 20089m(1000 mg/m2)  q12h on day 1-14 every 21 days plus AVASTIN, starting 02/01/2017.  stopped after 12 days due to poor tolerance on 02/14/17  Changed to maintenance 5-FU and avastin every 2 weeks starting on 02/22/17, stopped in 09/2017.       04/10/2017 Imaging    IMPRESSION: 1. Status post right hemicolectomy without findings for recurrent tumor. 2. No worrisome hepatic lesions. Treated disease with only a small residual low attenuation lesion in the right hepatic lobe. 3. No recurrent mediastinal or abdominal lymphadenopathy.     07/09/2017 PET scan    PET 07/09/17 IMPRESSION: 1. Status post right hemicolectomy, without findings of hypermetabolic recurrent or metastatic disease. 2. A right middle lobe pulmonary nodule  is unchanged. However, there is a right lower lobe 5 mm pulmonary nodule which is felt to be new and enlarged compared to prior exams. Suspicious for an isolated pulmonary metastasis. Consider CT follow-up at 3-6 months. 3. Age advanced coronary artery atherosclerosis. Recommend assessment of coronary risk factors and consideration of medical therapy. 4. Borderline ascending aortic dilatation, 4.0 cm      10/10/2017 Imaging    IMPRESSION: 1. Several (at least 10) subcentimeter pulmonary nodules scattered in both lungs, predominantly in the lower  lobes, all new/increased, most compatible with enlarging pulmonary metastases, largest 8 mm in the right lower lobe. 2. No additional findings of new or progressive metastatic disease. No recurrent adenopathy. Stable small low-attenuation lesion in the inferior right liver lobe compatible with treated metastasis. No new liver metastases. 3. Stable ectatic 4.0 cm ascending thoracic aorta. Recommend annual imaging followup by CTA or MRA.  4. Stable mild splenomegaly.     10/11/2017 - 12/2017 Chemotherapy    FOLFIRI and Avastin every 2 weeks starting 10/11/17, irnotecan stopped in 12/2017 due to disease progression      01/08/2018 Imaging    01/08/2018 CT CAP IMPRESSION: 1. Progressive hepatic and pulmonary metastatic disease. 2. Ascending Aortic aneurysm NOS (ICD10-I71.9).    01/08/2018 Progression    01/08/2018 CT CAP IMPRESSION: 1. Progressive hepatic and pulmonary metastatic disease. 2. Ascending Aortic aneurysm NOS (ICD10-I71.9).    01/10/2018 - 03/07/2018 Chemotherapy    FOLFOX and Avastin every 2 weeks starting on 01/10/2018. Stopped due to disease progression     03/19/2018 Progression    03/19/2018 CT CAP IMPRESSION: 1. Interval increase in size of dominant nodule within the right lower lobe. There are a few additional nodules which have increased in size. Multiple additional small bilateral pulmonary nodules are grossly similar. 2. Slight interval increase in size of lesion within the right hepatic lobe. 3. Interval increase in soft tissue at the surgical anastomosis involving the small bowel within the central abdomen. Locally recurrent disease not excluded.     04/07/2018 - 04/2018 Chemotherapy    Third lineXeloda 1592m BID 7 days on/7 days off and Avastin every 2 weeksstarting 04/07/18 stopped after 1 cycle and 4 days because he started Clinical trail.     05/22/2018 [facility]    [facility]   LINES AND TUBES: None.  LOWER THORAX: Please see  same day CT chest for findings above the diaphragm..Marland Kitchen HEPATOBILIARY: Low-attenuation lesion in segment 5 has minimally increased in size from prior measuring 2.4 x 2.0 cm, previously 2.3 x 1.8 cm when remeasured (8:42). The gallbladder is present and otherwise unremarkable. No biliary dilatation.   SPLEEN: Unremarkable. PANCREAS: Unremarkable.  ADRENALS: Unremarkable. KIDNEYS/URETERS: Unremarkable.  BLADDER: Decompressed making further evaluation difficult. PELVIC/REPRODUCTIVE ORGANS: Unremarkable.  GI TRACT: No dilated or thick walled loops of bowel. Sequelae of right hemicolectomy and appendectomy.   PERITONEUM/RETROPERITONEUM AND MESENTERY: Minimally decreased spiculated mesenteric soft tissue density at the site of enteroenteric anastomosis, now measuring roughly 2.6 x 1.4 cm, previously 3.3 x 3.1 cm (8:54). There is also a mild amount of stranding near the anastomosis within the mid abdomen (8:54)   LYMPH NODES: Borderline enlarged 1.0 cm porta hepatic lymph node, previously 0.7 cm (8:26). VESSELS: The aorta is normal in caliber.  No significant calcified atherosclerotic disease. Though the contrast timing is suboptimal for evaluation of the portal venous system, the portal venous system is appears patent. The hepatic veins and IVC are unremarkable.  BONES AND SOFT  TISSUES: Small fat-containing ventral hernia..    05/24/2018 - 08/02/2018 Chemotherapy    [id number] with palbociclib (Ibrnace) 130m 3 weeks on/1 week off and binimetinib 15 mg BID starting 05/24/18. Stopped 08/02/18 due to disease progression.     08/02/2018 Imaging    [facility]:  Since 05/22/2018:  -Unchanged to mildly decreased in size segment 6 hepatic lesion.  -Unchanged irregular soft tissue density at the enteroenteric anastomosis.  -Increase in size of an irregular soft tissue density at the enterocolic anastomosis. This is new since 03/19/2018.  -Mild decrease in size  of porta hepatis lymph node.  IMPRESSION:  Slight interval increase in multiple metastatic pulmonary nodules.  New likely malignant small right pleural effusion. Interval prominent right infrahilar lymph nodes; metastasis is in the differential. Mild interlobular septal thickening in the right middle and lower lobes is concerning for lymphangitic tumor spread. Attention on follow-up is suggested.       Chemotherapy    PENDING oral Ragorafinib daily and IV Nivolumab q2weeks starting 09/02/18        09/02/2018 -  Chemotherapy    The patient had nivolumab (OPDIVO) 270 mg in sodium chloride 0.9 % 100 mL chemo infusion, 3 mg/kg = 270 mg (original dose ), Intravenous, Once, 0 of 6 cycles Dose modification: 3 mg/kg (Cycle 1, Reason: Provider Judgment, Comment: based on the phase 2 REGONIVO data )  for chemotherapy treatment.      REVIEW OF SYSTEMS:   Constitutional: Denies fevers, chills.  Reports anorexia. Eyes: Denies blurriness of vision Ears, nose, mouth, throat, and face: Denies mucositis or sore throat Respiratory: Denies cough, dyspnea or wheezes Cardiovascular: Denies palpitation, chest discomfort Gastrointestinal: Reports vomiting after receiving Dilaudid.  Has not had a bowel movement in several days. Skin: Denies abnormal skin rashes Lymphatics: Denies new lymphadenopathy or easy bruising Neurological:Denies numbness, tingling or new weaknesses Behavioral/Psych: Mood is stable, no new changes  Extremities: No lower extremity edema All other systems were reviewed with the patient and are negative.  I have reviewed the past medical history, past surgical history, social history and family history with the patient and they are unchanged from previous note.   PHYSICAL EXAMINATION:  Vitals:   08/30/18 1335 08/30/18 1336  BP: (!) 143/99 (!) 143/99  Pulse: (!) 105 (!) 103  Resp:  20  Temp:  97.6 F (36.4 C)  SpO2: 97% 97%   Filed Weights   08/30/18 1334  Weight: 190 lb  (86.2 kg)    GENERAL:alert, appears comfortable, but then started to experience several episodes of vomiting.  SKIN: skin color, texture, turgor are normal, no rashes or significant lesions EYES: normal, Conjunctiva are pink and non-injected, sclera clear OROPHARYNX:no exudate, no erythema and lips, buccal mucosa, and tongue normal  NECK: supple, thyroid normal size, non-tender, without nodularity LYMPH:  no palpable lymphadenopathy in the cervical, axillary or inguinal LUNGS: clear to auscultation and percussion with normal breathing effort HEART: regular rate & rhythm and no murmurs and no lower extremity edema ABDOMEN: Hypoactive bowel sounds. Non-distended. No masses palpated.  Musculoskeletal:no cyanosis of digits and no clubbing  NEURO: alert & oriented x 3 with fluent speech, no focal motor/sensory deficits  LABORATORY DATA:  I have reviewed the data as listed CMP Latest Ref Rng & Units 08/29/2018 08/21/2018 06/17/2018  Glucose 70 - 99 mg/dL 101(H) 143(H) 115(H)  BUN 6 - 20 mg/dL _0 Creatinine 0.61 - 1.24 mg/dL 1.05 1.13 1.11  Sodium  135 - 145 mmol/L 138 138 136  Potassium 3.5 - 5.1 mmol/L 4.0 4.4 4.6  Chloride 98 - 111 mmol/L 100 101 101  CO2 22 - 32 mmol/L _0 Calcium 8.9 - 10.3 mg/dL 10.1 9.5 9.0  Total Protein 6.5 - 8.1 g/dL 8.1 7.8 6.8  Total Bilirubin 0.3 - 1.2 mg/dL 1.5(H) 0.5 0.6  Alkaline Phos 38 - 126 U/L 91 82 80  AST 15 - 41 U/L 38 20 31  ALT 0 - 44 U/L _1 Lab Results  Component Value Date   WBC 8.1 08/30/2018   HGB 14.1 08/30/2018   HCT 42.6 08/30/2018   MCV 99.5 08/30/2018   PLT 220 08/30/2018   NEUTROABS 5.8 08/30/2018    ASSESSMENT AND PLAN:  1.  Stage IV colorectal cancer 2.  Pain secondary to #1 3.  Depression and anxiety 4.  Hypertension 5.  Anorexia weight loss secondary to colorectal cancer and uncontrolled pain  -The patient has had disease progression.  He will begin a new chemotherapy regimen next week as an  outpatient. -Pain is not currently controlled with oral medications.  He will be admitted for IV pain control.  Palliative care has been consulted to assist [place] in pain management. -Continue venlafaxine.  Consider adding Ativan as needed. -Continue amlodipine and losartan for hypertension  Please call oncology over the weekend if there are questions.  We will follow-up with the patient again on Monday.  Mikey Bussing, DNP, AGPCNP-BC, AOCNP

## 2018-08-30 NOTE — ED Notes (Addendum)
Attempted to call for handoff; told to call back

## 2018-08-30 NOTE — ED Provider Notes (Signed)
[facility] EMERGENCY DEPARTMENT Provider Note   CSN: [id number] Arrival date & time: 08/30/18  1328    History   Chief Complaint Chief Complaint  Patient presents with  . Abdominal Pain    HPI Corbet Hanley is a 58 y.o. male.  He has a history of metastatic colon cancer has had poor response to chemotherapy and has been given a prognosis of 6 months to a year.  For the last month he has had escalating abdominal pain since coming off a clinical trial.  He has been on increasing doses of narcotics that has not controlled the pain.  He was here yesterday for same and sent home with some oral Dilaudid.  He talked to his oncologist today and they recommended he come in and get admitted so he can have the pain management plan.  No fevers or chills.  Decreased appetite.  8 out of 10 abdominal pain diffuse stabbing in nature.      The history is provided by the patient.  Abdominal Pain  Pain location:  Generalized Pain quality: stabbing   Pain radiates to:  Does not radiate Pain severity:  Severe Onset quality:  Gradual Timing:  Constant Progression:  Worsening Chronicity:  New Context: not trauma   Relieved by:  Nothing Worsened by:  Nothing Ineffective treatments: narcotics. Associated symptoms: anorexia, constipation and nausea   Associated symptoms: no chest pain, no dysuria, no fever, no shortness of breath and no sore throat     Past Medical History:  Diagnosis Date  . Anxiety   . Cancer of ascending colon ([place])   . Hypertension   . Seasonal allergies     Patient Active Problem List   Diagnosis Date Noted  . 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 HTN (hypertension) 09/06/2016  . Port catheter in place 08/10/2016  . Iron deficiency anemia due to chronic blood loss 07/05/2016  . Goals of care, counseling/discussion 07/05/2016  . Diarrhea 06/06/2016  . Elevated blood pressure reading 06/06/2016  . Metastatic colon  cancer to liver ([place]) 06/01/2016    Past Surgical History:  Procedure Laterality Date  . APPENDECTOMY  1992  . CATARACT EXTRACTION W/ INTRAOCULAR LENS IMPLANT Left 04/2016  . COLON SURGERY    . COLONOSCOPY W/ BIOPSIES AND POLYPECTOMY  04/2015  . INGUINAL HERNIA REPAIR Right 1982  . IR RADIOLOGIST EVAL & MGMT  01/16/2017  . KNEE ARTHROSCOPY W/ MENISCECTOMY Right 1998  . LAPAROSCOPIC RIGHT COLECTOMY N/A 06/14/2016   Procedure: LAPAROSCOPIC HAND ASSISTED HEMICOLECTOMY AND SMALL BOWEL RESECTION.;  Surgeon: [name], MD;  Location: [facility];  Service: General;  Laterality: N/A;  . LIVER BIOPSY Right 06/14/2016   Procedure: LIVER BIOPSY;  Surgeon: [name], MD;  Location: [facility];  Service: General;  Laterality: Right;  Right Inferior Liver  . PORTACATH PLACEMENT N/A 07/06/2016   Procedure: INSERTION PORT-A-CATH;  Surgeon: [name], MD;  Location: [facility];  Service: General;  Laterality: N/A;  . VASECTOMY  2005        Home Medications    Prior to Admission medications   Medication Sig Start Date End Date Taking? Authorizing Provider  amLODipine (NORVASC) 10 MG tablet Take 1 tablet (10 mg total) by mouth daily. 03/21/18   Truitt Merle, MD  celecoxib (CELEBREX) 200 MG capsule Take 1 capsule (200 mg total) by mouth daily. 06/03/18   Truitt Merle, MD  HYDROcodone-acetaminophen (NORCO) 5-325 MG tablet Take 1 tablet by mouth every 6 (six)  hours as needed for moderate pain. 08/21/18   Truitt Merle, MD  HYDROmorphone (DILAUDID) 2 MG tablet Take 1 tablet (2 mg total) by mouth every 4 (four) hours as needed for severe pain. 08/29/18   Domenic Moras, PA-C  losartan (COZAAR) 50 MG tablet Take 50 mg by mouth daily.    [provider]  Multiple Vitamin (MULTIVITAMIN WITH MINERALS) TABS tablet Take 1 tablet by mouth daily.    [provider]  omeprazole (PRILOSEC) 20 MG capsule TAKE ONE CAPSULE BY MOUTH TWICE DAILY BEFORE A MEAL Patient taking differently: Take 20 mg by mouth 2  (two) times daily before a meal.  08/14/18   Truitt Merle, MD  oxyCODONE (OXY IR/ROXICODONE) 5 MG immediate release tablet Take 1-2 tablets (5-10 mg total) by mouth every 6 (six) hours as needed for severe pain. 08/28/18   Truitt Merle, MD  polyethylene glycol (MIRALAX / GLYCOLAX) 17 g packet Take 17 g by mouth daily. 08/29/18   Domenic Moras, PA-C  prochlorperazine (COMPAZINE) 10 MG tablet Take 1 tablet (10 mg total) by mouth every 6 (six) hours as needed for nausea or vomiting. 06/16/17   Truitt Merle, MD  regorafenib (STIVARGA) 40 MG tablet Take 2 tablets (80 mg total) by mouth daily. Take for 3 weeks on, 1 week off, repeat every 4 weeks. Take with a low fat and low calorie meal 08/23/18   Truitt Merle, MD  senna (SENOKOT) 8.6 MG tablet Take 1 tablet by mouth daily as needed for constipation.    [provider]  sildenafil (REVATIO) 20 MG tablet Take 60-100 mg by mouth See admin instructions. Take 3-5 tablets by mouth at least 30 minutes prior to sex. 01/19/18   [provider]  traMADol (ULTRAM) 50 MG tablet Take 1 tablet (50 mg total) by mouth every 6 (six) hours as needed. Patient taking differently: Take 50 mg by mouth every 6 (six) hours as needed for moderate pain.  07/12/18   Truitt Merle, MD  venlafaxine XR (EFFEXOR-XR) 150 MG 24 hr capsule TAKE ONE CAPSULE BY MOUTH DAILY WITH BREAKFAST Patient taking differently: Take 150 mg by mouth daily with breakfast.  04/22/18   Truitt Merle, MD    Family History Family History  Problem Relation Age of Onset  . Cancer Mother        lung cancer  . Stroke Mother   . Hypertension Father   . CAD Father   . Cancer Maternal Grandfather        prostate cancer     Social History Social History   Tobacco Use  . Smoking status: Former Smoker    Years: 2.00    Types: Cigarettes    Last attempt to quit: 1990    Years since quitting: 30.3  . Smokeless tobacco: Never Used  Substance Use Topics  . Alcohol use: Yes    Alcohol/week: 20.0 standard drinks     Types: 20 Cans of beer per week    Comment: pt states he drinks about 3-4 beers per night  . Drug use: No    Comment: 06/15/2016 "nothing since college"     Allergies   Penicillins and Xeloda [capecitabine]   Review of Systems Review of Systems  Constitutional: Negative for fever.  HENT: Negative for sore throat.   Eyes: Negative for visual disturbance.  Respiratory: Negative for shortness of breath.   Cardiovascular: Negative for chest pain.  Gastrointestinal: Positive for abdominal pain, anorexia, constipation and nausea.  Genitourinary: Negative for dysuria.  Musculoskeletal: Negative for neck pain.  Skin: Negative for rash.  Neurological: Negative for headaches.     Physical Exam Updated Vital Signs BP (!) 143/99 (BP Location: Right Arm)   Pulse (!) 103   Temp 97.6 F (36.4 C) (Oral)   Resp 20   Ht 5\' 10"  (1.778 m)   Wt 86.2 kg   SpO2 97%   BMI 27.26 kg/m   Physical Exam Vitals signs and nursing note reviewed.  Constitutional:      Appearance: He is well-developed.  HENT:     Head: Normocephalic and atraumatic.  Eyes:     Conjunctiva/sclera: Conjunctivae normal.  Neck:     Musculoskeletal: Neck supple.  Cardiovascular:     Rate and Rhythm: Regular rhythm. Tachycardia present.     Heart sounds: No murmur.  Pulmonary:     Effort: Pulmonary effort is normal. No respiratory distress.     Breath sounds: Normal breath sounds.  Abdominal:     Palpations: Abdomen is soft. There is no mass.     Tenderness: There is abdominal tenderness. There is no guarding.  Musculoskeletal: Normal range of motion.     Right lower leg: No edema.     Left lower leg: No edema.  Skin:    General: Skin is warm and dry.     Capillary Refill: Capillary refill takes less than 2 seconds.  Neurological:     General: No focal deficit present.     Mental Status: He is alert and oriented to person, place, and time.      ED Treatments / Results  Labs (all labs ordered are listed,  but only abnormal results are displayed) Labs Reviewed  CBC WITH DIFFERENTIAL/PLATELET - Abnormal; Notable for the following components:      Result Value   Monocytes Absolute 1.1 (*)    All other components within normal limits  COMPREHENSIVE METABOLIC PANEL - Abnormal; Notable for the following components:   Glucose, Bld 114 (*)    Creatinine, Ser 1.39 (*)    GFR calc non Af Amer 57 (*)    All other components within normal limits  LIPASE, BLOOD  URINALYSIS, ROUTINE W REFLEX MICROSCOPIC  HIV ANTIBODY (ROUTINE TESTING W REFLEX)    EKG None  Radiology Ct Abdomen Pelvis W Contrast  Result Date: 08/29/2018 CLINICAL DATA:  Abdominal pain, metastatic colon cancer EXAM: CT ABDOMEN AND PELVIS WITH CONTRAST TECHNIQUE: Multidetector CT imaging of the abdomen and pelvis was performed using the standard protocol following bolus administration of intravenous contrast. CONTRAST:  189mL ISOVUE-300 IOPAMIDOL (ISOVUE-300) INJECTION 61% COMPARISON:  CT chest abdomen pelvis, 03/19/2018 FINDINGS: Lower chest: There is a small although increased right pleural effusion. Multiple pulmonary nodules in the included bilateral lung bases, several of which are new and enlarged compared to prior examination (e.g. Series 4, image 1). Hepatobiliary: Interval enlargement of a hypodense lesion of the inferior margin of the right lobe of the liver, hepatic segment VI (series 3, image 33). No gallstones, gallbladder wall thickening, or biliary dilatation. Pancreas: Unremarkable. No pancreatic ductal dilatation or surrounding inflammatory changes. Spleen: Normal in size without focal abnormality. Accessory splenule. Adrenals/Urinary Tract: Adrenal glands are unremarkable. Kidneys are normal, without renal calculi, focal lesion, or hydronephrosis. Bladder is unremarkable. Stomach/Bowel: Stomach is within normal limits. No evidence of bowel wall thickening, distention, or inflammatory changes. Evidence of prior right colon  resection with adjacent mesenteric nodularity increased compared to prior examination (series 3, image 41). Increasing peritoneal nodularity about the umbilicus and anterior  peritoneum (series 3, image 47, 32). Moderate burden of stool in the colon. Vascular/Lymphatic: No significant vascular findings are present. No enlarged abdominal or pelvic lymph nodes. Reproductive: No mass or other abnormality. Other: No abdominal wall hernia or abnormality. Trace perihepatic ascites. Musculoskeletal: No acute or significant osseous findings. IMPRESSION: 1. No acute CT findings of the abdomen or pelvis to explain abdominal pain. 2. Evidence of worsening metastatic colon malignancy status post right colon resection, including increasing mesenteric nodularity, increasing peritoneal nodularity, enlarging liver metastasis, and new and enlarging pulmonary nodules. 3. Trace perihepatic ascites, of uncertain etiology, although presumably malignant given peritoneal metastatic disease. Electronically Signed   By: Eddie Candle M.D.   On: 08/29/2018 12:00    Procedures Procedures (including critical care time)  Medications Ordered in ED Medications  HYDROmorphone (DILAUDID) injection 1 mg (has no administration in time range)  0.9 %  sodium chloride infusion (has no administration in time range)     Initial Impression / Assessment and Plan / ED Course  I have reviewed the triage vital signs and the nursing notes.  Pertinent labs & imaging results that were available during my care of the patient were reviewed by me and considered in my medical decision making (see chart for details).  Clinical Course as of Aug 30 1534  Fri Aug 29, 6417  [id number] 58 year old male with history of metastatic colon cancer here with intractable abdominal pain.  He was sent in by his oncologist for admission for pain management.  No new symptoms.  He had labs and a CT yesterday so we will order some basic labs today.  Discussed with  hospitalist Dr. Cruzita Lederer who will evaluate for admission   [MB]    Clinical Course User Index [MB] Hayden Rasmussen, MD        Final Clinical Impressions(s) / ED Diagnoses   Final diagnoses:  Generalized abdominal pain  Malignant neoplasm of colon, unspecified part of colon [facility])    ED Discharge Orders    None       Hayden Rasmussen, MD 08/30/18 1537

## 2018-08-30 NOTE — Progress Notes (Signed)
New Admission Note:   Arrival Method: bed  Mental Orientation: Alert and oriented x4  Telemetry: none  Assessment: Completed Skin: intact  GE:ZMOQ AC Pain: 3/10  Tubes: none  Safety Measures: Safety Fall Prevention Plan has been given, discussed and signed Admission: Completed [facility] Orientation: Patient has been orientated to the room, unit and staff.  Family: none   Orders have been reviewed and implemented. Will continue to monitor the patient. Call light has been placed within reach and bed alarm has been activated.   Etter Royall RN [facility] Phone: [phone]

## 2018-08-30 NOTE — H&P (Signed)
History and Physical    Alvino Lechuga GEX:[id number] DOB: 10/01/1960 DOA: 08/30/2018  I have briefly reviewed the patient's prior medical records in [place]  PCP: Curlene Labrum, MD  Patient coming from: home  Chief Complaint: Intractable abdominal pain  HPI: Nicholas Lowery is a 58 y.o. male with medical history significant of metastatic colon cancer status post right colon resection, which is currently advancing and he is to be considered for phase 1 clinical trial, most recent CT scan of the abdomen pelvis done on 4/16 with evidence of worsening metastatic colon malignancy with increased peritoneal nodularity and enlarging liver mets, as well as new and enlarging pulmonary nodule.  Patient also has a history of hypertension, anxiety.  Patient presents with excruciating lower abdominal pain that has been progressive over the last couple weeks.  He was placed gradually on tramadol, hydrocodone, oxycodone without relief.  He came to the emergency room yesterday due to intractable pain, received IV Dilaudid with relief, felt a little bit better and decided to go home on p.o. Dilaudid.  Patient continued to experience excruciating pain unrelieved by oral medications, was unable to sleep last night and this morning he took for 5 mg oxycodone's with some relief.  He called his primary oncologist [name], and patient came to the hospital for admission.  He currently denies any chest pain, denies any shortness of breath.  He denies any fever or chills.  On my evaluation in the ED all of a sudden patient started retching and vomiting.   ED Course: In the ED his vitals are stable, he is slightly tachycardic at low 100s, afebrile.  Satting well on room air.  Blood work obtained yesterday fairly unremarkable, today's blood work pending.  CT scan discussed above.  We are asked to admit for pain control.  Review of Systems: As per HPI otherwise 10 point review of systems negative.   Past  Medical History:  Diagnosis Date  . Anxiety   . Cancer of ascending colon ([place])   . Hypertension   . Seasonal allergies     Past Surgical History:  Procedure Laterality Date  . APPENDECTOMY  1992  . CATARACT EXTRACTION W/ INTRAOCULAR LENS IMPLANT Left 04/2016  . COLON SURGERY    . COLONOSCOPY W/ BIOPSIES AND POLYPECTOMY  04/2015  . INGUINAL HERNIA REPAIR Right 1982  . IR RADIOLOGIST EVAL & MGMT  01/16/2017  . KNEE ARTHROSCOPY W/ MENISCECTOMY Right 1998  . LAPAROSCOPIC RIGHT COLECTOMY N/A 06/14/2016   Procedure: LAPAROSCOPIC HAND ASSISTED HEMICOLECTOMY AND SMALL BOWEL RESECTION.;  Surgeon: [name], MD;  Location: [facility];  Service: General;  Laterality: N/A;  . LIVER BIOPSY Right 06/14/2016   Procedure: LIVER BIOPSY;  Surgeon: [name], MD;  Location: [facility];  Service: General;  Laterality: Right;  Right Inferior Liver  . PORTACATH PLACEMENT N/A 07/06/2016   Procedure: INSERTION PORT-A-CATH;  Surgeon: [name], MD;  Location: [facility];  Service: General;  Laterality: N/A;  . VASECTOMY  2005     reports that he quit smoking about 30 years ago. His smoking use included cigarettes. He quit after 2.00 years of use. He has never used smokeless tobacco. He reports current alcohol use of about 20.0 standard drinks of alcohol per week. He reports that he does not use drugs.  Allergies  Allergen Reactions  . Penicillins Other (See Comments)    UNSPECIFIED REACTION   Has patient had a PCN reaction causing immediate rash, facial/tongue/throat swelling, SOB or  lightheadedness with hypotension: unknown Has patient had a PCN reaction causing severe rash involving mucus membranes or skin necrosis: unknown Has patient had a PCN reaction that required hospitalization unknown Has patient had a PCN reaction occurring within the last 10 years: No If all of the above answers are "NO", then may proceed with Cephalosporin use.   [name] [Capecitabine] Other (See Comments)     Foot pain, couldn't walk, bleeding gums, vomiting    Family History  Problem Relation Age of Onset  . Cancer Mother        lung cancer  . Stroke Mother   . Hypertension Father   . CAD Father   . Cancer Maternal Grandfather        prostate cancer     Prior to Admission medications   Medication Sig Start Date End Date Taking? Authorizing Provider  amLODipine (NORVASC) 10 MG tablet Take 1 tablet (10 mg total) by mouth daily. 03/21/18   Truitt Merle, MD  celecoxib (CELEBREX) 200 MG capsule Take 1 capsule (200 mg total) by mouth daily. 06/03/18   Truitt Merle, MD  HYDROcodone-acetaminophen (NORCO) 5-325 MG tablet Take 1 tablet by mouth every 6 (six) hours as needed for moderate pain. 08/21/18   Truitt Merle, MD  HYDROmorphone (DILAUDID) 2 MG tablet Take 1 tablet (2 mg total) by mouth every 4 (four) hours as needed for severe pain. 08/29/18   Domenic Moras, PA-C  losartan (COZAAR) 50 MG tablet Take 50 mg by mouth daily.    [provider]  Multiple Vitamin (MULTIVITAMIN WITH MINERALS) TABS tablet Take 1 tablet by mouth daily.    [provider]  omeprazole (PRILOSEC) 20 MG capsule TAKE ONE CAPSULE BY MOUTH TWICE DAILY BEFORE A MEAL Patient taking differently: Take 20 mg by mouth 2 (two) times daily before a meal.  08/14/18   Truitt Merle, MD  oxyCODONE (OXY IR/ROXICODONE) 5 MG immediate release tablet Take 1-2 tablets (5-10 mg total) by mouth every 6 (six) hours as needed for severe pain. 08/28/18   Truitt Merle, MD  polyethylene glycol (MIRALAX / GLYCOLAX) 17 g packet Take 17 g by mouth daily. 08/29/18   Domenic Moras, PA-C  prochlorperazine (COMPAZINE) 10 MG tablet Take 1 tablet (10 mg total) by mouth every 6 (six) hours as needed for nausea or vomiting. 06/16/17   Truitt Merle, MD  regorafenib (STIVARGA) 40 MG tablet Take 2 tablets (80 mg total) by mouth daily. Take for 3 weeks on, 1 week off, repeat every 4 weeks. Take with a low fat and low calorie meal 08/23/18   Truitt Merle, MD  senna (SENOKOT) 8.6 MG  tablet Take 1 tablet by mouth daily as needed for constipation.    [provider]  sildenafil (REVATIO) 20 MG tablet Take 60-100 mg by mouth See admin instructions. Take 3-5 tablets by mouth at least 30 minutes prior to sex. 01/19/18   [provider]  traMADol (ULTRAM) 50 MG tablet Take 1 tablet (50 mg total) by mouth every 6 (six) hours as needed. Patient taking differently: Take 50 mg by mouth every 6 (six) hours as needed for moderate pain.  07/12/18   Truitt Merle, MD  venlafaxine XR (EFFEXOR-XR) 150 MG 24 hr capsule TAKE ONE CAPSULE BY MOUTH DAILY WITH BREAKFAST Patient taking differently: Take 150 mg by mouth daily with breakfast.  04/22/18   Truitt Merle, MD    Physical Exam: Vitals:   08/30/18 1334 08/30/18 1335 08/30/18 1336  BP:  (!) 143/99 Marland Kitchen)  143/99  Pulse:  (!) 105 (!) 103  Resp:   20  Temp:   97.6 F (36.4 C)  TempSrc:   Oral  SpO2:  97% 97%  Weight: 86.2 kg    Height: 5\' 10"  (1.778 m)      Constitutional: No distress initially but then patient started experiencing significant episodes of vomiting Eyes: PERRL, lids and conjunctivae normal ENMT: Mucous membranes are moist.  Neck: normal, supple Respiratory: clear to auscultation bilaterally, no wheezing, no crackles. Normal respiratory effort. Cardiovascular: Regular rate and rhythm, no murmurs / rubs / gallops. No extremity edema.  Abdomen: no tenderness, no masses palpated. Bowel sounds positive.  Musculoskeletal: no clubbing / cyanosis.  Skin: no rashes Neurologic: CN 2-12 grossly intact. Strength 5/5 in all 4.  Psychiatric: Normal judgment and insight. Alert and oriented x 3. Normal mood.   Labs on Admission: I have personally reviewed following labs and imaging studies  CBC: Recent Labs  Lab 08/29/18 0929  WBC 6.8  NEUTROABS 4.9  HGB 15.9  HCT 47.3  MCV 98.3  PLT 254   Basic Metabolic Panel: Recent Labs  Lab 08/29/18 0929  NA 138  K 4.0  CL 100  CO2 24  GLUCOSE 101*  BUN 8   CREATININE 1.05  CALCIUM 10.1   GFR: Estimated Creatinine Clearance: 82.1 mL/min (by C-G formula based on SCr of 1.05 mg/dL). Liver Function Tests: Recent Labs  Lab 08/29/18 0929  AST 38  ALT 21  ALKPHOS 91  BILITOT 1.5*  PROT 8.1  ALBUMIN 4.8   Recent Labs  Lab 08/29/18 0929  LIPASE 24   No results for input(s): AMMONIA in the last 168 hours. Coagulation Profile: No results for input(s): INR, PROTIME in the last 168 hours. Cardiac Enzymes: No results for input(s): CKTOTAL, CKMB, CKMBINDEX, TROPONINI in the last 168 hours. BNP (last 3 results) No results for input(s): PROBNP in the last 8760 hours. HbA1C: No results for input(s): HGBA1C in the last 72 hours. CBG: No results for input(s): GLUCAP in the last 168 hours. Lipid Profile: No results for input(s): CHOL, HDL, LDLCALC, TRIG, CHOLHDL, LDLDIRECT in the last 72 hours. Thyroid Function Tests: No results for input(s): TSH, T4TOTAL, FREET4, T3FREE, THYROIDAB in the last 72 hours. Anemia Panel: No results for input(s): VITAMINB12, FOLATE, FERRITIN, TIBC, IRON, RETICCTPCT in the last 72 hours. Urine analysis:    Component Value Date/Time   COLORURINE YELLOW 08/29/2018 1110   APPEARANCEUR CLEAR 08/29/2018 1110   LABSPEC 1.008 08/29/2018 1110   PHURINE 9.0 (H) 08/29/2018 1110   GLUCOSEU NEGATIVE 08/29/2018 1110   HGBUR NEGATIVE 08/29/2018 1110   BILIRUBINUR NEGATIVE 08/29/2018 1110   KETONESUR NEGATIVE 08/29/2018 1110   PROTEINUR NEGATIVE 08/29/2018 1110   NITRITE NEGATIVE 08/29/2018 1110   LEUKOCYTESUR NEGATIVE 08/29/2018 1110     Radiological Exams on Admission: Ct Abdomen Pelvis W Contrast  Result Date: 08/29/2018 CLINICAL DATA:  Abdominal pain, metastatic colon cancer EXAM: CT ABDOMEN AND PELVIS WITH CONTRAST TECHNIQUE: Multidetector CT imaging of the abdomen and pelvis was performed using the standard protocol following bolus administration of intravenous contrast. CONTRAST:  185mL ISOVUE-300 IOPAMIDOL  (ISOVUE-300) INJECTION 61% COMPARISON:  CT chest abdomen pelvis, 03/19/2018 FINDINGS: Lower chest: There is a small although increased right pleural effusion. Multiple pulmonary nodules in the included bilateral lung bases, several of which are new and enlarged compared to prior examination (e.g. Series 4, image 1). Hepatobiliary: Interval enlargement of a hypodense lesion of the inferior margin of the right lobe of  the liver, hepatic segment VI (series 3, image 33). No gallstones, gallbladder wall thickening, or biliary dilatation. Pancreas: Unremarkable. No pancreatic ductal dilatation or surrounding inflammatory changes. Spleen: Normal in size without focal abnormality. Accessory splenule. Adrenals/Urinary Tract: Adrenal glands are unremarkable. Kidneys are normal, without renal calculi, focal lesion, or hydronephrosis. Bladder is unremarkable. Stomach/Bowel: Stomach is within normal limits. No evidence of bowel wall thickening, distention, or inflammatory changes. Evidence of prior right colon resection with adjacent mesenteric nodularity increased compared to prior examination (series 3, image 41). Increasing peritoneal nodularity about the umbilicus and anterior peritoneum (series 3, image 47, 32). Moderate burden of stool in the colon. Vascular/Lymphatic: No significant vascular findings are present. No enlarged abdominal or pelvic lymph nodes. Reproductive: No mass or other abnormality. Other: No abdominal wall hernia or abnormality. Trace perihepatic ascites. Musculoskeletal: No acute or significant osseous findings. IMPRESSION: 1. No acute CT findings of the abdomen or pelvis to explain abdominal pain. 2. Evidence of worsening metastatic colon malignancy status post right colon resection, including increasing mesenteric nodularity, increasing peritoneal nodularity, enlarging liver metastasis, and new and enlarging pulmonary nodules. 3. Trace perihepatic ascites, of uncertain etiology, although presumably  malignant given peritoneal metastatic disease. Electronically Signed   By: Eddie Candle M.D.   On: 08/29/2018 12:00    Assessment/Plan Active Problems:   Metastatic colon cancer to liver ([facility])   HTN (hypertension)   Cancer related pain   Principal Problem Uncontrolled cancer related pain -We will admit patient to the hospital, palliative care will be consulted to assist with pain management.  Meanwhile, will place patient on alternating doses of IV and p.o. Dilaudid to achieve short-term somewhat longer benefit.  Eventually needs either MS Contin versus fentanyl patch, defer that to palliative care.  Active Problems Nausea and vomiting -Sudden onset in the ED, potentially related to pain versus the fact that he took 4 oxycodone was earlier this morning as a side effect.  For now allow clear liquid diet, nausea medications, advance diet as tolerated  Hypertension -Continue home medications  Metastatic colon cancer -To be enrolled in a clinical trial per oncology as an outpatient.   DVT prophylaxis: Lovenox Code Status: DNR Family Communication: No family at bedside Disposition Plan: Home when ready Consults called: Palliative care   Marzetta Board, MD, PhD [facility]  Contact via [email or web address]  [facility] Office Info P: [phone]  F: [phone]   08/30/2018, 2:20 PM

## 2018-08-30 NOTE — Consult Note (Addendum)
Consultation Note Date: 08/30/2018   Patient Name: Nicholas Lowery  DOB: 1960-12-24  MRN: [id number]  Age / Sex: 58 y.o., male  PCP: Pleas Koch Virgina Evener, MD Referring Physician: Caren Griffins, MD  Reason for Consultation: Pain control  HPI/Patient Profile: 58 y.o. male  with past medical history of metastatic colon cancer- mets to liver, peritoneum (noted to have recent progression on CT scan and plan to start new treatment next week)- admitted on 08/30/2018 with uncontrolled pain. Palliative medicine consulted for assistance with pain management.   Clinical Assessment and Goals of Care: Met with patient. He reports ongoing abdominal pain, discomfort, excruciating at times. Has escalated pain medications starting with tramadol to oxydocodone- was discharged home from ED yesterday after receiving IV hydromorphone with relief and prescription for po hydromorphone, however, he did not fill this and did not take the po hydromorphone due to worries for addiction.   He states that the pain has been keeping him awake at night and he has missed work. His GOC is to be able to sleep, and to be able to continue to work. He is dependent at home with ADL's.   He acknowledges he has done some "experimenting" with the doses of his pain medications, trying to find a regimen that works for him. He took 4 of his '5mg'$  oxycodone for a total dose of 20 mg oxy IR this morning around 1030 with relief. States he was feeling ok until he vomited.  He is interested in coming up with a pain management plan involving a long acting pain medication.   Currently, he reports his pain is relieved after receiving .'5mg'$  hydromorphone IV , however, he was noted to vomit after receiving this- however, he states he received the IV hydromorphone yesterday with no nausea or vomiting.   Nicholas Lowery also acknowledges great stress and anxiety related  to his cancer diagnosis and trajectory after discussion with his Oncologist regarding his likely prognosis of 82mhs- 152yrHe worries about his finances, and his children.    Primary Decision Maker PATIENT    SUMMARY OF RECOMMENDATIONS -Lorazepam '1mg'$  sublingual now -lorazepam '1mg'$  sublingual q4hr prn anxiety, nausea -Hydromorphone '1mg'$  IV q2hr prn for severe pain -Will re-eval patient's 24 hour opioid dose requirement in the morning and transition to long acting pain medication- I am inclined to recommend a fentanyl patch to prevent Mr. CaBuffonerom being able to experiment as much with his dosing so that we can reliably and adequately treat his pain -Will also plan on starting dexamethasone tomorrow- will avoid today due to increased anxiety and want for rest -Trazodone 25 mg QHS for sleep -Senna 1 po QHS for bowel prophylaxis  Code Status/Advance Care Planning:  DNR  Palliative Prophylaxis:   Bowel Regimen   Discharge Planning: Home with Palliative Services  Primary Diagnoses: Present on Admission: . Cancer related pain . HTN (hypertension) . [facility]  I have reviewed the medical record, interviewed the patient and family, and examined the patient. The following  aspects are pertinent.  Past Medical History:  Diagnosis Date  . Anxiety   . Cancer of ascending colon ([place])   . Hypertension   . Seasonal allergies    Social History   Socioeconomic History  . Marital status: Married    Spouse name: Not on file  . Number of children: Not on file  . Years of education: Not on file  . Highest education level: Not on file  Occupational History  . Not on file  Social Needs  . Financial resource strain: Not on file  . Food insecurity:    Worry: Not on file    Inability: Not on file  . Transportation needs:    Medical: Not on file    Non-medical: Not on file  Tobacco Use  . Smoking status: Former Smoker    Years: 2.00    Types:  Cigarettes    Last attempt to quit: 1990    Years since quitting: 30.3  . Smokeless tobacco: Never Used  Substance and Sexual Activity  . Alcohol use: Yes    Alcohol/week: 20.0 standard drinks    Types: 20 Cans of beer per week    Comment: pt states he drinks about 3-4 beers per night  . Drug use: No    Comment: 06/15/2016 "nothing since college"  . Sexual activity: Yes  Lifestyle  . Physical activity:    Days per week: Not on file    Minutes per session: Not on file  . Stress: Not on file  Relationships  . Social connections:    Talks on phone: Not on file    Gets together: Not on file    Attends religious service: Not on file    Active member of club or organization: Not on file    Attends meetings of clubs or organizations: Not on file    Relationship status: Not on file  Other Topics Concern  . Not on file  Social History Narrative  . Not on file   Family History  Problem Relation Age of Onset  . Cancer Mother        lung cancer  . Stroke Mother   . Hypertension Father   . CAD Father   . Cancer Maternal Grandfather        prostate cancer    Scheduled Meds: . amLODipine  10 mg Oral Daily  . enoxaparin (LOVENOX) injection  40 mg Subcutaneous Q24H  . LORazepam  1 mg Sublingual NOW  . [START ON 08/31/2018] losartan  50 mg Oral Daily  . multivitamin with minerals  1 tablet Oral Daily  . pantoprazole  40 mg Oral Daily  . polyethylene glycol  17 g Oral Daily  . traZODone  25 mg Oral QHS  . [START ON 08/31/2018] venlafaxine XR  150 mg Oral Q breakfast   Continuous Infusions: PRN Meds:.HYDROmorphone (DILAUDID) injection, HYDROmorphone, LORazepam, ondansetron (ZOFRAN) IV, senna Medications Prior to Admission:  Prior to Admission medications   Medication Sig Start Date End Date Taking? Authorizing Provider  amLODipine (NORVASC) 10 MG tablet Take 1 tablet (10 mg total) by mouth daily. 03/21/18   Truitt Merle, MD  celecoxib (CELEBREX) 200 MG capsule Take 1 capsule (200 mg  total) by mouth daily. 06/03/18   Truitt Merle, MD  HYDROcodone-acetaminophen (NORCO) 5-325 MG tablet Take 1 tablet by mouth every 6 (six) hours as needed for moderate pain. 08/21/18   Truitt Merle, MD  HYDROmorphone (DILAUDID) 2 MG tablet Take 1 tablet (2 mg total) by mouth  every 4 (four) hours as needed for severe pain. 08/29/18   Domenic Moras, PA-C  losartan (COZAAR) 50 MG tablet Take 50 mg by mouth daily.    [provider]  Multiple Vitamin (MULTIVITAMIN WITH MINERALS) TABS tablet Take 1 tablet by mouth daily.    [provider]  omeprazole (PRILOSEC) 20 MG capsule TAKE ONE CAPSULE BY MOUTH TWICE DAILY BEFORE A MEAL Patient taking differently: Take 20 mg by mouth 2 (two) times daily before a meal.  08/14/18   Truitt Merle, MD  oxyCODONE (OXY IR/ROXICODONE) 5 MG immediate release tablet Take 1-2 tablets (5-10 mg total) by mouth every 6 (six) hours as needed for severe pain. 08/28/18   Truitt Merle, MD  polyethylene glycol (MIRALAX / GLYCOLAX) 17 g packet Take 17 g by mouth daily. 08/29/18   Domenic Moras, PA-C  prochlorperazine (COMPAZINE) 10 MG tablet Take 1 tablet (10 mg total) by mouth every 6 (six) hours as needed for nausea or vomiting. 06/16/17   Truitt Merle, MD  regorafenib (STIVARGA) 40 MG tablet Take 2 tablets (80 mg total) by mouth daily. Take for 3 weeks on, 1 week off, repeat every 4 weeks. Take with a low fat and low calorie meal 08/23/18   Truitt Merle, MD  senna (SENOKOT) 8.6 MG tablet Take 1 tablet by mouth daily as needed for constipation.    [provider]  sildenafil (REVATIO) 20 MG tablet Take 60-100 mg by mouth See admin instructions. Take 60-100 mg by mouth at least 30 minutes prior to sexual activity 01/19/18   [provider]  traMADol (ULTRAM) 50 MG tablet Take 1 tablet (50 mg total) by mouth every 6 (six) hours as needed. Patient taking differently: Take 50 mg by mouth every 6 (six) hours as needed (for pain).  07/12/18   Truitt Merle, MD  venlafaxine XR (EFFEXOR-XR) 150  MG 24 hr capsule TAKE ONE CAPSULE BY MOUTH DAILY WITH BREAKFAST Patient taking differently: Take 150 mg by mouth daily with breakfast.  04/22/18   Truitt Merle, MD   Allergies  Allergen Reactions  . Penicillins Other (See Comments)    UNSPECIFIED REACTION   Has patient had a PCN reaction causing immediate rash, facial/tongue/throat swelling, SOB or lightheadedness with hypotension: unknown Has patient had a PCN reaction causing severe rash involving mucus membranes or skin necrosis: unknown Has patient had a PCN reaction that required hospitalization unknown Has patient had a PCN reaction occurring within the last 10 years: No If all of the above answers are "NO", then may proceed with Cephalosporin use.   [name] [Capecitabine] Nausea And Vomiting and Other (See Comments)    Foot pain, patient couldn't walk, and bleeding gums   Review of Systems  Constitutional: Positive for appetite change. Negative for fatigue.  Gastrointestinal: Positive for abdominal pain. Negative for constipation and diarrhea.  Psychiatric/Behavioral: Positive for sleep disturbance. The patient is nervous/anxious.     Physical Exam Vitals signs and nursing note reviewed.  Constitutional:      Appearance: He is well-developed.  Cardiovascular:     Rate and Rhythm: Normal rate and regular rhythm.  Pulmonary:     Effort: Pulmonary effort is normal.  Skin:    General: Skin is warm and dry.  Neurological:     Mental Status: He is alert and oriented to person, place, and time.     Vital Signs: BP (!) 128/93   Pulse 86   Temp 97.6 F (36.4 C) (Oral)   Resp 20  Ht '5\' 10"'$  (1.778 m)   Wt 86.2 kg   SpO2 96%   BMI 27.26 kg/m  Pain Scale: 0-10   Pain Score: 10-Worst pain ever   SpO2: SpO2: 96 % O2 Device:SpO2: 96 % O2 Flow Rate: .   IO: Intake/output summary: No intake or output data in the 24 hours ending 08/30/18 1636  LBM:   Baseline Weight: Weight: 86.2 kg Most recent weight: Weight: 86.2 kg      Palliative Assessment/Data:PPS: 80%     Thank you for this consult. Palliative medicine will continue to follow and assist as needed.   Time In: 1545 Time Out: 1700 Time Total: 75 minutes Greater than 50%  of this time was spent counseling and coordinating care related to the above assessment and plan.  Signed by: Mariana Kaufman, AGNP-C Palliative Medicine    Please contact Palliative Medicine Team phone at [phone] for questions and concerns.  For individual provider: See Shea Evans

## 2018-08-30 NOTE — Telephone Encounter (Signed)
Oral Oncology Pharmacist Encounter  Confirmed with [facility] specialty pharmacy that patient's [facility] prescription will be delivered to his home today, 08/30/18.  Nicholas Lowery, PharmD, BCPS, BCOP  08/30/2018 9:22 AM Oral Oncology Clinic [phone]

## 2018-08-30 NOTE — ED Notes (Signed)
ED TO INPATIENT HANDOFF REPORT  ED Nurse Name and Phone #: Caprice Kluver [phone]  S Name/Age/Gender Nicholas Lowery 58 y.o. male Room/Bed: 019C/019C  Code Status   Code Status: DNR  Home/SNF/Other Home Patient oriented to: self, place, time and situation Is this baseline? Yes   Triage Complete: Triage complete  Chief Complaint Abdominal Pain  Triage Note Pt has stage 4 cancer. Pt here due to pain. Pt states the meds he has received are not helping and needs something to help with his pain/sleep cycle.    Allergies Allergies  Allergen Reactions  . Penicillins Other (See Comments)    UNSPECIFIED REACTION   Has patient had a PCN reaction causing immediate rash, facial/tongue/throat swelling, SOB or lightheadedness with hypotension: unknown Has patient had a PCN reaction causing severe rash involving mucus membranes or skin necrosis: unknown Has patient had a PCN reaction that required hospitalization unknown Has patient had a PCN reaction occurring within the last 10 years: No If all of the above answers are "NO", then may proceed with Cephalosporin use.   [name] [Capecitabine] Nausea And Vomiting and Other (See Comments)    Foot pain, patient couldn't walk, and bleeding gums    Level of Care/Admitting Diagnosis ED Disposition    ED Disposition Condition [place]: [facility] [100100]  Level of Care: Med-Surg [16]  I expect the patient will be discharged within 24 hours: No (not a candidate for 5C-Observation unit)  Covid Evaluation: N/A  Diagnosis: Cancer related pain [893734]  Admitting Physician: Caren Griffins [phone]  Attending Physician: Caren Griffins [[id number]]  PT Class (Do Not Modify): Observation [104]  PT Acc Code (Do Not Modify): Observation [10022]       B Medical/Surgery History Past Medical History:  Diagnosis Date  . Anxiety   . Cancer of ascending colon ([facility])   . Hypertension   . Seasonal  allergies    Past Surgical History:  Procedure Laterality Date  . APPENDECTOMY  1992  . CATARACT EXTRACTION W/ INTRAOCULAR LENS IMPLANT Left 04/2016  . COLON SURGERY    . COLONOSCOPY W/ BIOPSIES AND POLYPECTOMY  04/2015  . INGUINAL HERNIA REPAIR Right 1982  . IR RADIOLOGIST EVAL & MGMT  01/16/2017  . KNEE ARTHROSCOPY W/ MENISCECTOMY Right 1998  . LAPAROSCOPIC RIGHT COLECTOMY N/A 06/14/2016   Procedure: LAPAROSCOPIC HAND ASSISTED HEMICOLECTOMY AND SMALL BOWEL RESECTION.;  Surgeon: [name], MD;  Location: [facility];  Service: General;  Laterality: N/A;  . LIVER BIOPSY Right 06/14/2016   Procedure: LIVER BIOPSY;  Surgeon: [name], MD;  Location: [facility];  Service: General;  Laterality: Right;  Right Inferior Liver  . PORTACATH PLACEMENT N/A 07/06/2016   Procedure: INSERTION PORT-A-CATH;  Surgeon: [name], MD;  Location: [facility];  Service: General;  Laterality: N/A;  . VASECTOMY  2005     A IV Location/Drains/Wounds Patient Lines/Drains/Airways Status   Active Line/Drains/Airways    Name:   Placement date:   Placement time:   Site:   Days:   Implanted Port 07/06/16 Left Chest   07/06/16    0957    Chest   785   Peripheral IV 08/29/18 Anterior Forearm   08/29/18    0939    Forearm   1   Peripheral IV 08/30/18 Right Antecubital   08/30/18    1404    Antecubital   less than 1   Incision (Closed) 06/14/16 Abdomen Other (Comment)   06/14/16  1556     807   Incision (Closed) 07/06/16 Chest Left   07/06/16    1001     785   Incision - 1 Port Abdomen 1: Superior;Medial   06/14/16    1541     807   Incision - 3 Ports Abdomen 1: Left;Superior;Lateral 2: Left;Mid;Lateral 3: Left;Lower;Lateral   06/14/16    1424     807          Intake/Output Last 24 hours No intake or output data in the 24 hours ending 08/30/18 1626  Labs/Imaging Results for orders placed or performed during the hospital encounter of 08/30/18 (from the past 48 hour(s))  CBC with Differential      Status: Abnormal   Collection Time: 08/30/18  1:52 PM  Result Value Ref Range   WBC 8.1 4.0 - 10.5 K/uL   RBC 4.28 4.22 - 5.81 MIL/uL   Hemoglobin 14.1 13.0 - 17.0 g/dL   HCT 42.6 39.0 - 52.0 %   MCV 99.5 80.0 - 100.0 fL   MCH 32.9 26.0 - 34.0 pg   MCHC 33.1 30.0 - 36.0 g/dL   RDW 14.0 11.5 - 15.5 %   Platelets 220 150 - 400 K/uL   nRBC 0.0 0.0 - 0.2 %   Neutrophils Relative % 71 %   Neutro Abs 5.8 1.7 - 7.7 K/uL   Lymphocytes Relative 14 %   Lymphs Abs 1.1 0.7 - 4.0 K/uL   Monocytes Relative 13 %   Monocytes Absolute 1.1 (H) 0.1 - 1.0 K/uL   Eosinophils Relative 1 %   Eosinophils Absolute 0.1 0.0 - 0.5 K/uL   Basophils Relative 1 %   Basophils Absolute 0.0 0.0 - 0.1 K/uL   Immature Granulocytes 0 %   Abs Immature Granulocytes 0.01 0.00 - 0.07 K/uL    Comment: Performed at [facility], [address], [place], [place] [address]  Comprehensive metabolic panel     Status: Abnormal   Collection Time: 08/30/18  1:52 PM  Result Value Ref Range   Sodium 139 135 - 145 mmol/L   Potassium 4.0 3.5 - 5.1 mmol/L   Chloride 101 98 - 111 mmol/L   CO2 25 22 - 32 mmol/L   Glucose, Bld 114 (H) 70 - 99 mg/dL   BUN 12 6 - 20 mg/dL   Creatinine, Ser 1.39 (H) 0.61 - 1.24 mg/dL   Calcium 9.9 8.9 - 10.3 mg/dL   Total Protein 7.5 6.5 - 8.1 g/dL   Albumin 4.2 3.5 - 5.0 g/dL   AST 24 15 - 41 U/L   ALT 21 0 - 44 U/L   Alkaline Phosphatase 74 38 - 126 U/L   Total Bilirubin 1.1 0.3 - 1.2 mg/dL   GFR calc non Af Amer 57 (L) >60 mL/min   GFR calc Af Amer >60 >60 mL/min   Anion gap 13 5 - 15    Comment: Performed at [facility] [address] [place], [place] [address]  Lipase, blood     Status: None   Collection Time: 08/30/18  1:52 PM  Result Value Ref Range   Lipase 26 11 - 51 U/L    Comment: Performed at [facility] [address] [place], [place] [address]   Ct Abdomen Pelvis W Contrast  Result Date: 08/29/2018 CLINICAL DATA:  Abdominal pain, metastatic colon  cancer EXAM: CT ABDOMEN AND PELVIS WITH CONTRAST TECHNIQUE: Multidetector CT imaging of the abdomen and pelvis was  performed using the standard protocol following bolus administration of intravenous contrast. CONTRAST:  145mL ISOVUE-300 IOPAMIDOL (ISOVUE-300) INJECTION 61% COMPARISON:  CT chest abdomen pelvis, 03/19/2018 FINDINGS: Lower chest: There is a small although increased right pleural effusion. Multiple pulmonary nodules in the included bilateral lung bases, several of which are new and enlarged compared to prior examination (e.g. Series 4, image 1). Hepatobiliary: Interval enlargement of a hypodense lesion of the inferior margin of the right lobe of the liver, hepatic segment VI (series 3, image 33). No gallstones, gallbladder wall thickening, or biliary dilatation. Pancreas: Unremarkable. No pancreatic ductal dilatation or surrounding inflammatory changes. Spleen: Normal in size without focal abnormality. Accessory splenule. Adrenals/Urinary Tract: Adrenal glands are unremarkable. Kidneys are normal, without renal calculi, focal lesion, or hydronephrosis. Bladder is unremarkable. Stomach/Bowel: Stomach is within normal limits. No evidence of bowel wall thickening, distention, or inflammatory changes. Evidence of prior right colon resection with adjacent mesenteric nodularity increased compared to prior examination (series 3, image 41). Increasing peritoneal nodularity about the umbilicus and anterior peritoneum (series 3, image 47, 32). Moderate burden of stool in the colon. Vascular/Lymphatic: No significant vascular findings are present. No enlarged abdominal or pelvic lymph nodes. Reproductive: No mass or other abnormality. Other: No abdominal wall hernia or abnormality. Trace perihepatic ascites. Musculoskeletal: No acute or significant osseous findings. IMPRESSION: 1. No acute CT findings of the abdomen or pelvis to explain abdominal pain. 2. Evidence of worsening metastatic colon malignancy status  post right colon resection, including increasing mesenteric nodularity, increasing peritoneal nodularity, enlarging liver metastasis, and new and enlarging pulmonary nodules. 3. Trace perihepatic ascites, of uncertain etiology, although presumably malignant given peritoneal metastatic disease. Electronically Signed   By: Eddie Candle M.D.   On: 08/29/2018 12:00    Pending Labs Unresulted Labs (From admission, onward)    Start     Ordered   08/31/18 1497  Basic metabolic panel  Tomorrow morning,   R     08/30/18 1421   08/31/18 0500  CBC  Tomorrow morning,   R     08/30/18 1421   08/30/18 1422  HIV antibody (Routine Testing)  Once,   R     08/30/18 1421   08/30/18 1352  Urinalysis, Routine w reflex microscopic  (ED Abdominal Pain)  ONCE - STAT,   STAT     08/30/18 1351          Vitals/Pain Today's Vitals   08/30/18 1335 08/30/18 1336 08/30/18 1515 08/30/18 1615  BP: (!) 143/99 (!) 143/99 (!) 129/98 (!) 128/93  Pulse: (!) 105 (!) 103 91 86  Resp:  20    Temp:  97.6 F (36.4 C)    TempSrc:  Oral    SpO2: 97% 97% 96% 96%  Weight:      Height:      PainSc:        Isolation Precautions No active isolations  Medications Medications  ondansetron (ZOFRAN) injection 4 mg (has no administration in time range)  HYDROmorphone (DILAUDID) tablet 4 mg (has no administration in time range)  amLODipine (NORVASC) tablet 10 mg (has no administration in time range)  venlafaxine XR (EFFEXOR-XR) 24 hr capsule 150 mg (has no administration in time range)  pantoprazole (PROTONIX) EC tablet 40 mg (has no administration in time range)  polyethylene glycol (MIRALAX / GLYCOLAX) packet 17 g (has no administration in time range)  senna (SENOKOT) tablet 8.6 mg (has no administration in time range)  multivitamin with minerals tablet 1 tablet (has no administration  in time range)  enoxaparin (LOVENOX) injection 40 mg (has no administration in time range)  losartan (COZAAR) tablet 50 mg (has no  administration in time range)  LORazepam (ATIVAN) tablet 1 mg (has no administration in time range)  LORazepam (ATIVAN) tablet 1 mg (has no administration in time range)  traZODone (DESYREL) tablet 25 mg (has no administration in time range)  HYDROmorphone (DILAUDID) injection 1 mg (has no administration in time range)  HYDROmorphone (DILAUDID) injection 1 mg (1 mg Intravenous Given 08/30/18 1405)  0.9 %  sodium chloride infusion (1,000 mLs Intravenous New Bag/Given 08/30/18 1413)  ondansetron (ZOFRAN) injection 4 mg (4 mg Intravenous Given 08/30/18 1414)    Mobility walks Moderate fall risk   Focused Assessments Other   R Recommendations: See Admitting Provider Note  Report given to:   Additional Notes: Palliative Care

## 2018-08-31 DIAGNOSIS — Z7189 Other specified counseling: Secondary | ICD-10-CM | POA: Diagnosis not present

## 2018-08-31 DIAGNOSIS — G893 Neoplasm related pain (acute) (chronic): Secondary | ICD-10-CM | POA: Diagnosis not present

## 2018-08-31 DIAGNOSIS — C189 Malignant neoplasm of colon, unspecified: Secondary | ICD-10-CM | POA: Diagnosis not present

## 2018-08-31 DIAGNOSIS — R1084 Generalized abdominal pain: Secondary | ICD-10-CM | POA: Diagnosis not present

## 2018-08-31 DIAGNOSIS — F418 Other specified anxiety disorders: Secondary | ICD-10-CM | POA: Diagnosis not present

## 2018-08-31 LAB — CBC
HCT: 38.9 % — ABNORMAL LOW (ref 39.0–52.0)
Hemoglobin: 12.7 g/dL — ABNORMAL LOW (ref 13.0–17.0)
MCH: 32.3 pg (ref 26.0–34.0)
MCHC: 32.6 g/dL (ref 30.0–36.0)
MCV: 99 fL (ref 80.0–100.0)
Platelets: 177 10*3/uL (ref 150–400)
RBC: 3.93 MIL/uL — ABNORMAL LOW (ref 4.22–5.81)
RDW: 14 % (ref 11.5–15.5)
WBC: 4.6 10*3/uL (ref 4.0–10.5)
nRBC: 0 % (ref 0.0–0.2)

## 2018-08-31 LAB — BASIC METABOLIC PANEL
Anion gap: 12 (ref 5–15)
BUN: 13 mg/dL (ref 6–20)
CO2: 24 mmol/L (ref 22–32)
Calcium: 9.3 mg/dL (ref 8.9–10.3)
Chloride: 102 mmol/L (ref 98–111)
Creatinine, Ser: 0.95 mg/dL (ref 0.61–1.24)
GFR calc Af Amer: >60 mL/min (ref >60)
GFR calc non Af Amer: >60 mL/min (ref >60)
Glucose, Bld: 93 mg/dL (ref 70–99)
Potassium: 4 mmol/L (ref 3.5–5.1)
Sodium: 138 mmol/L (ref 135–145)

## 2018-08-31 LAB — HIV ANTIBODY (ROUTINE TESTING W REFLEX): HIV Screen 4th Generation wRfx: NONREACTIVE

## 2018-08-31 MED ORDER — FENTANYL 12 MCG/HR TD PT72
1.0000 | MEDICATED_PATCH | TRANSDERMAL | Status: DC
  Administered 2018-08-31 – 2018-09-03 (×2): 1 via TRANSDERMAL
  Filled 2018-08-31 (×2): qty 1

## 2018-08-31 MED ORDER — DEXAMETHASONE 0.5 MG PO TABS
1.0000 mg | ORAL_TABLET | Freq: Two times a day (BID) | ORAL | Status: DC
  Administered 2018-09-01 – 2018-09-02 (×2): 1 mg via ORAL
  Filled 2018-08-31 (×3): qty 2

## 2018-08-31 MED ORDER — HYDROMORPHONE HCL 2 MG PO TABS
2.0000 mg | ORAL_TABLET | ORAL | Status: DC | PRN
  Administered 2018-08-31 – 2018-09-01 (×3): 2 mg via ORAL
  Filled 2018-08-31 (×3): qty 1

## 2018-08-31 MED ORDER — HYDROMORPHONE HCL 1 MG/ML IJ SOLN: 0.5000 mg | INTRAMUSCULAR | Status: DC | PRN

## 2018-08-31 MED ORDER — HYDROMORPHONE HCL 1 MG/ML IJ SOLN
1.0000 mg | INTRAMUSCULAR | Status: DC | PRN
  Administered 2018-08-31 – 2018-09-01 (×2): 1 mg via INTRAVENOUS
  Filled 2018-08-31 (×2): qty 1

## 2018-08-31 MED ORDER — HYDROMORPHONE HCL 2 MG PO TABS
1.0000 mg | ORAL_TABLET | ORAL | Status: DC | PRN
  Administered 2018-08-31 (×2): 1 mg via ORAL
  Filled 2018-08-31 (×2): qty 1

## 2018-08-31 MED ORDER — HYDROMORPHONE HCL 1 MG/ML IJ SOLN
0.5000 mg | INTRAMUSCULAR | Status: DC | PRN
  Administered 2018-08-31: 0.5 mg via INTRAVENOUS
  Filled 2018-08-31: qty 1

## 2018-08-31 NOTE — Progress Notes (Addendum)
Daily Progress Note   Patient Name: Nicholas Lowery       Date: 08/31/2018 DOB: 1961/02/13  Age: 58 y.o. MRN#: [id number] Attending Physician: Norval Morton, MD Primary Care Physician: Curlene Labrum, MD Admit Date: 08/30/2018  Reason for Consultation/Follow-up: Pain control    Subjective: States had "ok" night. Was awakened by another patient. Reports pain has been controlled- but is coming back. He states he hasn't asked for pain medicine because he wanted to see how bad it got before asking for pain medication. No nausea. Decreased appetite. Gave emotional support as [name] processed his prognosis and his worries regarding his children, his relationship with his spouse, his fears of dying and the impact his death will have on his children. He is very concerned about pain medication interfering with his ability to function.   Review of Systems  Constitutional: Positive for malaise/fatigue.  Respiratory: Negative for shortness of breath.   Gastrointestinal: Positive for abdominal pain. Negative for vomiting.  Psychiatric/Behavioral: Positive for depression. The patient is nervous/anxious.     Length of Stay: 0  Current Medications: Scheduled Meds:  . amLODipine  10 mg Oral Daily  . dexamethasone  1 mg Oral BID  . enoxaparin (LOVENOX) injection  40 mg Subcutaneous Q24H  . fentaNYL  1 patch Transdermal Q72H  . losartan  50 mg Oral Daily  . multivitamin with minerals  1 tablet Oral Daily  . pantoprazole  40 mg Oral Daily  . polyethylene glycol  17 g Oral Daily  . senna  1 tablet Oral QHS  . traZODone  25 mg Oral QHS  . venlafaxine XR  150 mg Oral Q breakfast    Continuous Infusions:   PRN Meds: HYDROmorphone, LORazepam, ondansetron (ZOFRAN) IV  Physical Exam Vitals  signs and nursing note reviewed.  Constitutional:      Appearance: He is well-developed.  Skin:    General: Skin is warm and dry.     Coloration: Skin is not jaundiced.  Neurological:     General: No focal deficit present.     Mental Status: He is alert and oriented to person, place, and time.  Psychiatric:        Mood and Affect: Mood is anxious.     Comments: tearful  Vital Signs: BP (!) 140/96 (BP Location: Left Arm)   Pulse 83   Temp 98 F (36.7 C) (Oral)   Resp 20   Ht 5\' 10"  (1.778 m)   Wt 86.9 kg   SpO2 92%   BMI 27.49 kg/m  SpO2: SpO2: 92 % O2 Device: O2 Device: Room Air O2 Flow Rate:    Intake/output summary:   Intake/Output Summary (Last 24 hours) at 08/31/2018 1200 Last data filed at 08/31/2018 0900 Gross per 24 hour  Intake 240 ml  Output 0 ml  Net 240 ml   LBM: Last BM Date: 08/28/18 Baseline Weight: Weight: 86.2 kg Most recent weight: Weight: 86.9 kg       Palliative Assessment/Data: PPS: 80%      Patient Active Problem List   Diagnosis Date Noted  . Cancer related pain 08/30/2018  . Advanced care planning/counseling discussion   . Palliative care by specialist   . Anxiety associated with cancer diagnosis   . 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 HTN (hypertension) 09/06/2016  . Port catheter in place 08/10/2016  . Iron deficiency anemia due to chronic blood loss 07/05/2016  . Goals of care, counseling/discussion 07/05/2016  . Diarrhea 06/06/2016  . Elevated blood pressure reading 06/06/2016  . Metastatic colon cancer to liver [facility]) 06/01/2016    Palliative Care Assessment & Plan   Patient Profile: 58 y.o. male  with past medical history of metastatic colon cancer- mets to liver, peritoneum (noted to have recent progression on CT scan and plan to start new treatment next week)- admitted on 08/30/2018 with uncontrolled pain. Palliative medicine consulted for assistance with pain management.    Assessment/Recommendations/Plan   Based on last 24hr oral morphine equivalent use of 30mg - will start fentanyl patch 12.mcg/hr q72 hr. Will add hydromorphone 1mg  po q2hr prn for breakthrough pain.  Dexamethasone 1mg  po BID for fatigue and appetite  Discussed with patient the need to be aggressive with pain medication before pain becomes uncontrollable- "easier to prevent pain, then to get it under control once it has gotten out of control"  There is also an element of existential pain- referred for Chaplain consult, [facility] for assistance with children, and outpatient Palliative assistance  Continue Trazodone 25mg  qhs for sleep  Continue lorazepam 1mg  q4hr prn for anxiety  Continue current bowel prophylaxis as ordered  Permission given for patient to visit wife outside front of the hospital due to [facility] visitor restrictions.  Goals of Care and Additional Recommendations:  Limitations on Scope of Treatment: Full Scope Treatment  Code Status:  DNR  Prognosis:   Unable to determine  Discharge Planning:  Home with Palliative Services  Care plan was discussed with patient.  Thank you for allowing the Palliative Medicine Team to assist in the care of this patient.   Time In: 1100 Time Out: 1200 Total Time 60 minutes Prolonged Time Billed yes      Greater than 50%  of this time was spent counseling and coordinating care related to the above assessment and plan.  Mariana Kaufman, AGNP-C Palliative Medicine   Please contact Palliative Medicine Team phone at [phone] for questions and concerns.

## 2018-08-31 NOTE — Care Management (Addendum)
4-18 16:30- Spoke w patient and discussed home palliative providers. He is interested in [facility] Referral placed to [facility], hospital liaison. She will tentatively accept and double check availability and confirm Sunday (tomorrow).  4-19 09:00 Eva with [facility] in contact with patient and wife to complete assessment, per Harmon Pier patient had a rough night and is declining to talk at this time, requesting rest, but not refusing consult. She will follow up later.

## 2018-08-31 NOTE — Progress Notes (Addendum)
Progress Note    Nicholas Lowery  OMB:[id number] DOB: 24-Mar-1961  DOA: 08/30/2018 PCP: Curlene Labrum, MD    Brief Narrative:   Chief complaint: Intractable abdominal pain  Medical records reviewed and are as summarized below:  Nicholas Lowery is an 58 y.o. male with past medical history significant for metastatic colon cancer s/p right colon resection in phase 1 clinical trials with most recent CT scan abdomen 4/16 showing worsening metastatic colon disease; who presents with uncontrolled abdominal pain.  Assessment/Plan:   Active Problems:   Metastatic colon cancer to liver ([facility])   HTN (hypertension)   Cancer related pain   Advanced care planning/counseling discussion   Palliative care by specialist   Anxiety associated with cancer diagnosis Uncontrolled cancer related pain, metastatic colon cancer -In a clinical trial per oncology as an outpatient. -Palliative care consulted recommended starting patient on 12 mcg/h fentanyl patch with Dilaudid 1 mg p.o. every 2 hours for breakthrough pain -Dexamethasone 1 mg p.o. twice daily for fatigue and appetite -Continue current bowel regimen -Appreciate palliative care consultative services will follow-up for any further recommendation -Addendum: Paged by nurse as patient requesting IV Dilaudid to be added back on as pain uncontrolled 5 PM today.  0.5 mg of IV Dilaudid prn pain not relieved with oral Dilaudid added   Nausea and vomiting -Symptoms occurred after pain medications in the emergency department.  However, overnight reports symptoms much improved.  Hypertension: Stable -Continue home medications losartan and amlodipine  Insomnia -Continue trazodone for sleep  Anxiety -Continue Effexor -Continue Ativan as needed for anxiety  Body mass index is 27.49 kg/m.   Family Communication/Anticipated D/C date and plan/Code Status   DVT prophylaxis: Lovenox ordered. Code Status: DNR  Family Communication: No  family present at bedside Disposition Plan: Likely discharge home in 1 to 2 days once pain under better control   Medical Consultants:    Palliative care   Anti-Infectives:    None  Subjective:   Patient reports that he had a very large belch that may have helped as he required very little pain medication overnight.  He has decreased appetite, but notes that the nausea and vomiting symptoms that occurred yesterday after IV pain medication have resolved. Objective:    Vitals:   08/30/18 1702 08/30/18 2153 08/31/18 0607 08/31/18 1648  BP: 131/88 (!) 139/92 (!) 140/96 (!) 146/104  Pulse: 82 77 83 92  Resp: 18 19 20 20   Temp: 98.2 F (36.8 C) 98.2 F (36.8 C) 98 F (36.7 C) 97.9 F (36.6 C)  TempSrc: Oral Oral Oral Oral  SpO2: 97% 95% 92% 98%  Weight:  86.9 kg    Height:        Intake/Output Summary (Last 24 hours) at 08/31/2018 1731 Last data filed at 08/31/2018 1500 Gross per 24 hour  Intake 240 ml  Output 0 ml  Net 240 ml   Filed Weights   08/30/18 1334 08/30/18 1701 08/30/18 2153  Weight: 86.2 kg 86.7 kg 86.9 kg    Exam: Constitutional: NAD, calm, comfortable Eyes: PERRL, lids and conjunctivae normal ENMT: Mucous membranes are moist. Posterior pharynx clear of any exudate or lesions.   Neck: normal, supple, no masses, no thyromegaly Respiratory: clear to auscultation bilaterally, no wheezing, no crackles. Normal respiratory effort. No accessory muscle use.  Cardiovascular: Regular rate and rhythm, no murmurs / rubs / gallops. No extremity edema. 2+ pedal pulses. No carotid bruits.  Abdomen: no tenderness, bowel sounds present Musculoskeletal: no clubbing / cyanosis.  No joint deformity upper and lower extremities. Good ROM, no contractures. Normal muscle tone.  Skin: no rashes, lesions, ulcers. No induration Neurologic: CN 2-12 grossly intact. Sensation intact, DTR normal. Strength 5/5 in all 4.  Psychiatric: Normal judgment and insight. Alert and oriented x  3.  Anxious mood.    Data Reviewed:   I have personally reviewed following labs and imaging studies:  Labs: Labs show the following:   Basic Metabolic Panel: Recent Labs  Lab 08/29/18 0929 08/30/18 1352 08/31/18 0532  NA 138 139 138  K 4.0 4.0 4.0  CL 100 101 102  CO2 24 25 24   GLUCOSE 101* 114* 93  BUN 8 12 13   CREATININE 1.05 1.39* 0.95  CALCIUM 10.1 9.9 9.3   GFR Estimated Creatinine Clearance: 90.7 mL/min (by C-G formula based on SCr of 0.95 mg/dL). Liver Function Tests: Recent Labs  Lab 08/29/18 0929 08/30/18 1352  AST 38 24  ALT 21 21  ALKPHOS 91 74  BILITOT 1.5* 1.1  PROT 8.1 7.5  ALBUMIN 4.8 4.2   Recent Labs  Lab 08/29/18 0929 08/30/18 1352  LIPASE 24 26   No results for input(s): AMMONIA in the last 168 hours. Coagulation profile No results for input(s): INR, PROTIME in the last 168 hours.  CBC: Recent Labs  Lab 08/29/18 0929 08/30/18 1352 08/31/18 0532  WBC 6.8 8.1 4.6  NEUTROABS 4.9 5.8  --   HGB 15.9 14.1 12.7*  HCT 47.3 42.6 38.9*  MCV 98.3 99.5 99.0  PLT 249 220 177   Cardiac Enzymes: No results for input(s): CKTOTAL, CKMB, CKMBINDEX, TROPONINI in the last 168 hours. BNP (last 3 results) No results for input(s): PROBNP in the last 8760 hours. CBG: No results for input(s): GLUCAP in the last 168 hours. D-Dimer: No results for input(s): DDIMER in the last 72 hours. Hgb A1c: No results for input(s): HGBA1C in the last 72 hours. Lipid Profile: No results for input(s): CHOL, HDL, LDLCALC, TRIG, CHOLHDL, LDLDIRECT in the last 72 hours. Thyroid function studies: No results for input(s): TSH, T4TOTAL, T3FREE, THYROIDAB in the last 72 hours.  Invalid input(s): FREET3 Anemia work up: No results for input(s): VITAMINB12, FOLATE, FERRITIN, TIBC, IRON, RETICCTPCT in the last 72 hours. Sepsis Labs: Recent Labs  Lab 08/29/18 0929 08/30/18 1352 08/31/18 0532  WBC 6.8 8.1 4.6    Microbiology No results found for this or any  previous visit (from the past 240 hour(s)).  Procedures and diagnostic studies:  No results found.  Medications:   . amLODipine  10 mg Oral Daily  . [START ON 09/01/2018] dexamethasone  1 mg Oral BID WC  . enoxaparin (LOVENOX) injection  40 mg Subcutaneous Q24H  . fentaNYL  1 patch Transdermal Q72H  . losartan  50 mg Oral Daily  . multivitamin with minerals  1 tablet Oral Daily  . pantoprazole  40 mg Oral Daily  . polyethylene glycol  17 g Oral Daily  . senna  1 tablet Oral QHS  . traZODone  25 mg Oral QHS  . venlafaxine XR  150 mg Oral Q breakfast   Continuous Infusions:   LOS: 0 days    A   [facility]   *Please refer to [email or web address], password [email or web address] to get updated schedule on who will round on this patient, as hospitalists switch teams weekly. If 7PM-7AM, please contact night-coverage at [email or web address], password [email or web address] for any overnight needs.

## 2018-08-31 NOTE — Progress Notes (Addendum)
Patient weepy and speaking at length with NP.  Spoke of his increased anxiety over leaving his spouse and children and expressed emotional pain over feeling that he would be responsible if his family were left financially destitute.  Spiritual counsel and OP program for family were offered to family.  Patient given visitation privileges with wife at the entrance of the facility.  He was escorted there by this nurse to orient him to his surroundings.  Given Dilaudid IV for pain management with good effects at 1119.  Individualized pain management plan of care reviewed with patient.  Will monitor his response.

## 2018-09-01 DIAGNOSIS — G893 Neoplasm related pain (acute) (chronic): Secondary | ICD-10-CM | POA: Diagnosis not present

## 2018-09-01 DIAGNOSIS — Z7189 Other specified counseling: Secondary | ICD-10-CM | POA: Diagnosis not present

## 2018-09-01 DIAGNOSIS — R1084 Generalized abdominal pain: Secondary | ICD-10-CM | POA: Diagnosis not present

## 2018-09-01 DIAGNOSIS — Z515 Encounter for palliative care: Secondary | ICD-10-CM | POA: Diagnosis not present

## 2018-09-01 DIAGNOSIS — F418 Other specified anxiety disorders: Secondary | ICD-10-CM | POA: Diagnosis not present

## 2018-09-01 MED ORDER — SENNA 8.6 MG PO TABS: 1.0000 | ORAL_TABLET | Freq: Two times a day (BID) | ORAL | Status: DC | PRN

## 2018-09-01 MED ORDER — HYDROMORPHONE HCL 2 MG PO TABS
4.0000 mg | ORAL_TABLET | ORAL | Status: DC | PRN
  Administered 2018-09-01 – 2018-09-02 (×6): 4 mg via ORAL
  Filled 2018-09-01 (×6): qty 2

## 2018-09-01 MED ORDER — HYDROMORPHONE HCL 2 MG PO TABS: 3.0000 mg | ORAL_TABLET | ORAL | Status: DC | PRN

## 2018-09-01 MED ORDER — HYDROMORPHONE HCL 1 MG/ML IJ SOLN: 1.0000 mg | INTRAMUSCULAR | Status: DC | PRN

## 2018-09-01 MED ORDER — SENNOSIDES-DOCUSATE SODIUM 8.6-50 MG PO TABS
2.0000 | ORAL_TABLET | Freq: Two times a day (BID) | ORAL | Status: DC
  Administered 2018-09-01 – 2018-09-03 (×5): 2 via ORAL
  Filled 2018-09-01 (×5): qty 2

## 2018-09-01 MED ORDER — LORAZEPAM 1 MG PO TABS
1.0000 mg | ORAL_TABLET | ORAL | Status: DC | PRN
  Administered 2018-09-01 (×2): 1 mg via ORAL
  Filled 2018-09-01 (×2): qty 1

## 2018-09-01 MED ORDER — GABAPENTIN 100 MG PO CAPS
100.0000 mg | ORAL_CAPSULE | Freq: Every day | ORAL | Status: DC
  Administered 2018-09-01: 100 mg via ORAL
  Filled 2018-09-01: qty 1

## 2018-09-01 NOTE — Progress Notes (Signed)
Palliative:  HPI: 58 yo male with PMH colon cancer mets to liver, peritoneum, lung (follows [name]) admitted 08/30/18 with uncontrolled abd pain.   I met today with Nicholas Lowery along with his wife, Nicholas Lowery (she was via telephone). We discussed expectations and goals for his pain management. His goals are clear that he desires to continue with treatments with the goal to prolong his life with quality. He is very concerned with providing for his family and that he must continue working. He needs his pain better managed to continue working.   We discussed specific plan for pain management. He is frustrated that he continues with pain and I further explained that fentanyl will take 24-48 hours to kick in. He also feels that po dilaudid takes too long to work (~30 min). I did explain that he needs to request prn po pain medications earlier as they will take longer to kick in to give him relief. Pain goal is 2-3/10 and currently 4/10 mainly in right abd with some neuropathic properties at times.   We also discussed how palliative care can partner with oncologist to work on his Rayne and pain management. They are interested in Monday morning palliative clinic at [facility].   Exam: Alert. Oriented. No distress. Abd soft.   Plan: - Abd pain:  - Fentanyl duragesic 12.5 mcg/hr.  - Dilaudid po 4 mg q2h prn.   - Added gabapentin 100 mg po qhs.  - Constipation: LBM 08/28/18. + flatus.   - Senokot-S 2 tablets BID.   - Miralax daily.   - Reassess if no BM by 09/02/18.  - Insomnia:  - Hopefully improve with pain control.   - D/C trazodone as no relief.   - May use Ativan for sleep too.  - Anxiety:  - Ativan 1 mg q4h prn.   45 min  Vinie Sill, NP Palliative Medicine Team Pager # [phone] (M-F 8a-5p) Team Phone # [phone] (Nights/Weekends)

## 2018-09-01 NOTE — Progress Notes (Addendum)
Progress Note    Nicholas Lowery  MWN:[id number] DOB: October 24, 1960  DOA: 08/30/2018 PCP: Juliette Alcide, MD    Brief Narrative:   Chief complaint: Intractable abdominal pain  Medical records reviewed and are as summarized below:  Nicholas Lowery is an 58 y.o. male with past medical history significant for metastatic colon cancer s/p right colon resection in phase 1 clinical trials with most recent CT scan abdomen 4/16 showing worsening metastatic colon disease; who presents with uncontrolled abdominal pain.  Assessment/Plan:   Active Problems:   Metastatic colon cancer to liver (HCC)   HTN (hypertension)   Cancer related pain   Advanced care planning/counseling discussion   Palliative care by specialist   Anxiety associated with cancer diagnosis  Uncontrolled cancer related pain, metastatic colon cancer -Patient to start a new clinical trial per oncology as an outpatient. -Palliative care consulted recommended 4/18 for patient to be on 12 mcg/h fentanyl patch with Dilaudid increased 1 to 2 mg p.o. every 2 hours for severe pain and IV Dilaudid 1 mg as needed breakthrough.(Pain medication adjustments per palliative care) -Dexamethasone 1 mg p.o. twice daily for fatigue and appetite -Continue current bowel regimen -Appreciate palliative care consultative services, will follow-up for any further recommendation  Nausea and vomiting -Symptoms occurred after pain medications in the emergency department.  Patient does not report any recurrence of symptoms since admission.  Hypertension: Stable -Continue home medications losartan and amlodipine  Insomnia -Continue trazodone for sleep  Anxiety -Continue Effexor -Continue Ativan as needed for anxiety  Constipation -Last bowel movement on 4/15 -Increase senna  to twice daily  Body mass index is 27.46 kg/m.   Family Communication/Anticipated D/C date and plan/Code Status   DVT prophylaxis: Lovenox ordered.  Code Status: DNR  Family Communication: No family present at bedside Disposition Plan: Likely discharge home in 1 to 2 days once pain under better control   Medical Consultants:    Palliative care   Anti-Infectives:    None  Subjective:   Patient reports that his pain has not been well controlled with the current regimen. He required the increased dose of IV Dilaudid overnight.  Notes some back pain, but relates it possibly to the hospital beds.  He also has had numerous "feels better after belching".  Wonders if there is something that can help with gas symptoms.  Last bowel movement was approximately 4 days ago, but reports still being able to pass flatus. Objective:    Vitals:   08/31/18 1648 08/31/18 2053 09/01/18 0447 09/01/18 0912  BP: (!) 146/104 (!) 140/97 (!) 140/102 (!) 149/104  Pulse: 92 90 80 76  Resp: 20 17 18 18   Temp: 97.9 F (36.6 C) 98.4 F (36.9 C) 97.8 F (36.6 C) 97.6 F (36.4 C)  TempSrc: Oral Oral Oral Oral  SpO2: 98% 94% 95% 97%  Weight:  86.8 kg    Height:        Intake/Output Summary (Last 24 hours) at 09/01/2018 1229 Last data filed at 09/01/2018 0831 Gross per 24 hour  Intake 960 ml  Output 0 ml  Net 960 ml   Filed Weights   08/30/18 1701 08/30/18 2153 08/31/18 2053  Weight: 86.7 kg 86.9 kg 86.8 kg    Exam: Constitutional: Older male NAD, calm, comfortable in chair Eyes: PERRL, lids and conjunctivae normal ENMT: Mucous membranes are moist. Posterior pharynx clear of any exudate or lesions.   Neck: normal, supple, no masses, no thyromegaly Respiratory: clear to auscultation bilaterally, no wheezing,  no crackles. Normal respiratory effort. No accessory muscle use.  Cardiovascular: Regular rate and rhythm, no murmurs / rubs / gallops. No extremity edema. 2+ pedal pulses. No carotid bruits.  Abdomen: no tenderness, bowel sounds present Musculoskeletal: no clubbing / cyanosis. No joint deformity upper and lower extremities. Good ROM, no  contractures. Normal muscle tone.  Skin: no rashes, lesions, ulcers. No induration Neurologic: CN 2-12 grossly intact. Sensation intact, DTR normal. Strength 5/5 in all 4.  Psychiatric: Normal judgment and insight. Alert and oriented x 3.  Anxious mood.    Data Reviewed:   I have personally reviewed following labs and imaging studies:  Labs: Labs show the following:   Basic Metabolic Panel: Recent Labs  Lab 08/29/18 0929 08/30/18 1352 08/31/18 0532  NA 138 139 138  K 4.0 4.0 4.0  CL 100 101 102  CO2 24 25 24   GLUCOSE 101* 114* 93  BUN 8 12 13   CREATININE 1.05 1.39* 0.95  CALCIUM 10.1 9.9 9.3   GFR Estimated Creatinine Clearance: 90.7 mL/min (by C-G formula based on SCr of 0.95 mg/dL). Liver Function Tests: Recent Labs  Lab 08/29/18 0929 08/30/18 1352  AST 38 24  ALT 21 21  ALKPHOS 91 74  BILITOT 1.5* 1.1  PROT 8.1 7.5  ALBUMIN 4.8 4.2   Recent Labs  Lab 08/29/18 0929 08/30/18 1352  LIPASE 24 26   No results for input(s): AMMONIA in the last 168 hours. Coagulation profile No results for input(s): INR, PROTIME in the last 168 hours.  CBC: Recent Labs  Lab 08/29/18 0929 08/30/18 1352 08/31/18 0532  WBC 6.8 8.1 4.6  NEUTROABS 4.9 5.8  --   HGB 15.9 14.1 12.7*  HCT 47.3 42.6 38.9*  MCV 98.3 99.5 99.0  PLT 249 220 177   Cardiac Enzymes: No results for input(s): CKTOTAL, CKMB, CKMBINDEX, TROPONINI in the last 168 hours. BNP (last 3 results) No results for input(s): PROBNP in the last 8760 hours. CBG: No results for input(s): GLUCAP in the last 168 hours. D-Dimer: No results for input(s): DDIMER in the last 72 hours. Hgb A1c: No results for input(s): HGBA1C in the last 72 hours. Lipid Profile: No results for input(s): CHOL, HDL, LDLCALC, TRIG, CHOLHDL, LDLDIRECT in the last 72 hours. Thyroid function studies: No results for input(s): TSH, T4TOTAL, T3FREE, THYROIDAB in the last 72 hours.  Invalid input(s): FREET3 Anemia work up: No results for  input(s): VITAMINB12, FOLATE, FERRITIN, TIBC, IRON, RETICCTPCT in the last 72 hours. Sepsis Labs: Recent Labs  Lab 08/29/18 0929 08/30/18 1352 08/31/18 0532  WBC 6.8 8.1 4.6    Microbiology No results found for this or any previous visit (from the past 240 hour(s)).  Procedures and diagnostic studies:  No results found.  Medications:   . amLODipine  10 mg Oral Daily  . dexamethasone  1 mg Oral BID WC  . enoxaparin (LOVENOX) injection  40 mg Subcutaneous Q24H  . fentaNYL  1 patch Transdermal Q72H  . losartan  50 mg Oral Daily  . multivitamin with minerals  1 tablet Oral Daily  . pantoprazole  40 mg Oral Daily  . polyethylene glycol  17 g Oral Daily  . senna  1 tablet Oral QHS  . traZODone  25 mg Oral QHS  . venlafaxine XR  150 mg Oral Q breakfast   Continuous Infusions:   LOS: 0 days   Geniya Fulgham A Irving Bloor  [facility]   *Please refer to [email or web address], password [email or web address] to get updated schedule on who  will round on this patient, as hospitalists switch teams weekly. If 7PM-7AM, please contact night-coverage at [email or web address], password [email or web address] for any overnight needs.

## 2018-09-01 NOTE — Progress Notes (Signed)
   09/01/18 1800  Clinical Encounter Type  Visited With Patient  Visit Type Initial;Spiritual support  Referral From Chaplain  Consult/Referral To Chaplain  Spiritual Encounters  Spiritual Needs Emotional;Prayer  Stress Factors  Patient Stress Factors Health changes;Loss of control;Major life changes;Family relationships   Referred by Harrie Foreman to follow up with PT spiritual support. PT was alert and very talkative. PT mentioned he had a lot to process with the recent diagnosis of his health. PT also mentioned his relationships to his wife and kids on how they may be dealing with his physical health. I offered spiritual care with words of comfort, empathic listening, ministry of presence, and prayer. PT was very thankful for the [facility] visit.  Chaplain Fidel Levy [phone]

## 2018-09-01 NOTE — Progress Notes (Signed)
Pt is in a lot of pain with Called palliative care, coming to see pt today to increase medication

## 2018-09-01 NOTE — Progress Notes (Signed)
Manufacturing engineer [facility]): Community Based Palliative Care  Received request from [employer] for patient interest in community based palliative at home after discharge. Chart reviewed and spoke spouse by phone. Per her report, she has been texting with Mr. Bollig this morning and he is trying to get some rest right now. Explained community based palliative services including use of telehealth and answered her questions. She plans to get back with me later today with a good time to reach out to Mr. Seever. She was able to provide information needed. Updated RNCM Debbie and [facility] hospital team will continue to follow, answer additional questions and confirm appointment time dependent on discharge date.  Thank you for this referral. Please do not hesitate to call with questions re [facility] services.   Erling Conte, [facility] Liaison [phone]  [facility] hospital liaisons are listed daily on AMION under Hospice and [place]

## 2018-09-01 NOTE — Progress Notes (Signed)
This chaplain responded to PMT request for Pt. spiritual care.  At the time of the visit, the Pt. politely declined a visit. The Pt. preferred to rest. The Pt. is open to a chaplain later in the day.

## 2018-09-02 ENCOUNTER — Other Ambulatory Visit: Payer: Self-pay

## 2018-09-02 ENCOUNTER — Inpatient Hospital Stay (HOSPITAL_COMMUNITY): Payer: BLUE CROSS/BLUE SHIELD

## 2018-09-02 ENCOUNTER — Encounter: Payer: Self-pay | Admitting: Hematology

## 2018-09-02 DIAGNOSIS — Z87891 Personal history of nicotine dependence: Secondary | ICD-10-CM | POA: Diagnosis not present

## 2018-09-02 DIAGNOSIS — C189 Malignant neoplasm of colon, unspecified: Secondary | ICD-10-CM | POA: Diagnosis not present

## 2018-09-02 DIAGNOSIS — F064 Anxiety disorder due to known physiological condition: Secondary | ICD-10-CM | POA: Diagnosis present

## 2018-09-02 DIAGNOSIS — F329 Major depressive disorder, single episode, unspecified: Secondary | ICD-10-CM | POA: Diagnosis present

## 2018-09-02 DIAGNOSIS — R630 Anorexia: Secondary | ICD-10-CM | POA: Diagnosis present

## 2018-09-02 DIAGNOSIS — R1084 Generalized abdominal pain: Secondary | ICD-10-CM | POA: Diagnosis present

## 2018-09-02 DIAGNOSIS — C787 Secondary malignant neoplasm of liver and intrahepatic bile duct: Secondary | ICD-10-CM | POA: Diagnosis present

## 2018-09-02 DIAGNOSIS — G620 Drug-induced polyneuropathy: Secondary | ICD-10-CM | POA: Diagnosis present

## 2018-09-02 DIAGNOSIS — Z801 Family history of malignant neoplasm of trachea, bronchus and lung: Secondary | ICD-10-CM | POA: Diagnosis not present

## 2018-09-02 DIAGNOSIS — K5900 Constipation, unspecified: Secondary | ICD-10-CM | POA: Diagnosis present

## 2018-09-02 DIAGNOSIS — R252 Cramp and spasm: Secondary | ICD-10-CM | POA: Diagnosis present

## 2018-09-02 DIAGNOSIS — Z6827 Body mass index (BMI) 27.0-27.9, adult: Secondary | ICD-10-CM | POA: Diagnosis not present

## 2018-09-02 DIAGNOSIS — R188 Other ascites: Secondary | ICD-10-CM | POA: Diagnosis present

## 2018-09-02 DIAGNOSIS — G4700 Insomnia, unspecified: Secondary | ICD-10-CM | POA: Diagnosis present

## 2018-09-02 DIAGNOSIS — Z7189 Other specified counseling: Secondary | ICD-10-CM | POA: Diagnosis not present

## 2018-09-02 DIAGNOSIS — T451X5A Adverse effect of antineoplastic and immunosuppressive drugs, initial encounter: Secondary | ICD-10-CM | POA: Diagnosis present

## 2018-09-02 DIAGNOSIS — C19 Malignant neoplasm of rectosigmoid junction: Secondary | ICD-10-CM | POA: Diagnosis present

## 2018-09-02 DIAGNOSIS — Z66 Do not resuscitate: Secondary | ICD-10-CM | POA: Diagnosis present

## 2018-09-02 DIAGNOSIS — K30 Functional dyspepsia: Secondary | ICD-10-CM | POA: Diagnosis present

## 2018-09-02 DIAGNOSIS — Z9049 Acquired absence of other specified parts of digestive tract: Secondary | ICD-10-CM | POA: Diagnosis not present

## 2018-09-02 DIAGNOSIS — N179 Acute kidney failure, unspecified: Secondary | ICD-10-CM | POA: Diagnosis present

## 2018-09-02 DIAGNOSIS — I10 Essential (primary) hypertension: Secondary | ICD-10-CM | POA: Diagnosis present

## 2018-09-02 DIAGNOSIS — F418 Other specified anxiety disorders: Secondary | ICD-10-CM | POA: Diagnosis not present

## 2018-09-02 DIAGNOSIS — Z880 Allergy status to penicillin: Secondary | ICD-10-CM | POA: Diagnosis not present

## 2018-09-02 DIAGNOSIS — G893 Neoplasm related pain (acute) (chronic): Secondary | ICD-10-CM | POA: Diagnosis present

## 2018-09-02 DIAGNOSIS — Z515 Encounter for palliative care: Secondary | ICD-10-CM | POA: Diagnosis not present

## 2018-09-02 DIAGNOSIS — Z79899 Other long term (current) drug therapy: Secondary | ICD-10-CM | POA: Diagnosis not present

## 2018-09-02 DIAGNOSIS — C786 Secondary malignant neoplasm of retroperitoneum and peritoneum: Secondary | ICD-10-CM | POA: Diagnosis present

## 2018-09-02 DIAGNOSIS — Z8042 Family history of malignant neoplasm of prostate: Secondary | ICD-10-CM | POA: Diagnosis not present

## 2018-09-02 MED ORDER — MAGNESIUM CITRATE PO SOLN
0.5000 | Freq: Once | ORAL | Status: AC
  Administered 2018-09-02: 0.5 via ORAL
  Filled 2018-09-02: qty 296

## 2018-09-02 MED ORDER — OXYCODONE HCL 5 MG PO TABS
15.0000 mg | ORAL_TABLET | ORAL | Status: DC | PRN
  Administered 2018-09-02: 15 mg via ORAL
  Filled 2018-09-02: qty 3

## 2018-09-02 MED ORDER — OXYCODONE HCL 5 MG PO TABS
15.0000 mg | ORAL_TABLET | ORAL | Status: DC | PRN
  Administered 2018-09-02 – 2018-09-03 (×4): 15 mg via ORAL
  Filled 2018-09-02 (×4): qty 3

## 2018-09-02 MED ORDER — LOSARTAN POTASSIUM 50 MG PO TABS
100.0000 mg | ORAL_TABLET | Freq: Every day | ORAL | Status: DC
  Administered 2018-09-03: 100 mg via ORAL
  Filled 2018-09-02: qty 2

## 2018-09-02 MED ORDER — HYDRALAZINE HCL 20 MG/ML IJ SOLN: 10.0000 mg | INTRAMUSCULAR | Status: DC | PRN

## 2018-09-02 MED ORDER — GABAPENTIN 100 MG PO CAPS
200.0000 mg | ORAL_CAPSULE | Freq: Every day | ORAL | Status: DC
  Administered 2018-09-02: 200 mg via ORAL
  Filled 2018-09-02: qty 2

## 2018-09-02 MED ORDER — SIMETHICONE 80 MG PO CHEW
80.0000 mg | CHEWABLE_TABLET | Freq: Four times a day (QID) | ORAL | Status: DC | PRN
  Administered 2018-09-02: 80 mg via ORAL
  Filled 2018-09-02: qty 1

## 2018-09-02 NOTE — Progress Notes (Signed)
Palliative:  I met again today with [name]. He is up and has taken a shower and is walking around the room snacking on his fruit. He continues to have pain (currently at 5/10) but overall this is improved. He did not sleep well last night but this was more related to a confused patient nearby than from discomfort. He still has not had a bowel movement but has previously had good results with mag citrate. We discussed plan for his pain below.   I also discussed plan with Nicholas Lowery this morning and she has stopped his decadron as this would interact with his upcoming treatment.   Exam: Alert, oriented. No distress. Functional and able to perform ADLs independently. Abd soft, nondistended. + flatus.   Plan: - Abd pain:             - Fentanyl duragesic 12.5 mcg/hr.             - D/C po dilaudid.   - OxyIR 15 mg po q3h prn.              - Gabapentin 200 mg po qhs.  - Constipation: LBM 08/28/18. + flatus.              - Senokot-S 2 tablets BID.              - Miralax daily.              - Magnesium citrate 0.5 bottle.  - Insomnia:             - Hopefully improve with pain control.              - D/C trazodone as no relief.              - May use Ativan for sleep too.  - Anxiety:             - Ativan 1 mg q4h prn.   71 min  Vinie Sill, NP Palliative Medicine Team Pager # [phone] (M-F 8a-5p) Team Phone # [phone] (Nights/Weekends)

## 2018-09-02 NOTE — Progress Notes (Signed)
[place]   [phone] Fax:([phone]   Clinic Follow up Note   Patient Care Team: Curlene Labrum, MD as PCP - General (Family Medicine)  Date of Service:  09/04/2018  CHIEF COMPLAINT: F/u of metastatic colon cancer   SUMMARY OF ONCOLOGIC HISTORY: Oncology History   Cancer Staging Metastatic colon cancer to liver [facility]) Staging form: Colon and Rectum, AJCC 8th Edition - Clinical stage from 06/01/2016: Stage IVA (cTX, cNX, pM1a) - Signed by Truitt Merle, MD on 07/04/2016 - Pathologic stage from 06/14/2016: Stage IVA (pT4b(m), pN2b, pM1a) - Signed by Truitt Merle, MD on 07/04/2016       Metastatic colon cancer to liver ([place])   04/2015 Procedure    Colonoscopy by Dr. Ladona Horns. It showed showed 2 sessile polyps ready between 3-5 mm in size located 20 cm (A, B) from the point of entry, polypectomy was performed. Pedunculated polyp was found in the ascending colon (C), polypectomy was performed, and additional polyp (D) was found 30 cm from the point of entry, removed    04/2015 Pathology Results    tubular adenoma (A and B), and well differentiated adenocarcinoma arising from tubulovillous adenoma (C) and well differentiated adenocarcinoma arising from severe dysplasia to intramucosal carcinoma within tubular adenoma.     04/2015 Initial Diagnosis    Metastatic colon cancer to liver ([place])    05/29/2016 Imaging    CT abdomen and pelvis with contrast showed an apple core like stricture in right colon just above the cecum, measuring 3.2 cm in lengths. This is highly suspicious for malignancy. Small lymph node a noticed he had adjacent mesentery, measuring 8 mm. There is a low-density lesion within the inferior aspect of the right hepatic lobe measuring 1.7 cm, suspicious for metastasis.     06/06/2016 Tumor Marker    CEA 9.99    06/08/2016 PET scan    IMPRESSION: Approximately 3 cm hypermetabolic mass in the ascending colon, consistent with primary colon  carcinoma. This mass results in colonic obstruction and small bowel dilatation. Additional areas of hypermetabolic wall thickening in the cecum may represent other sites of colon carcinoma or colitis. Mild hypermetabolic lymphadenopathy in right pericolonic region, porta hepatis, and aortocaval space, consistent with metastatic disease. Mild hypermetabolic mediastinal lymphadenopathy also seen, and thoracic lymph node metastases cannot be excluded. Solitary hypermetabolic focus in inferior right hepatic lobe, consistent with liver metastasis. Consider abdomen MRI without and with contrast for further evaluation.    06/14/2016 Surgery    Hand assisted right hemicolectomy and small bowel resection for colon cancer, liver biopsy, by Dr. Barry Dienes    06/14/2016 Pathology Results    Right hemicolectomy showed invasive well to moderately differentiated adenocarcinoma, 2 foci measuring 7.5 cm and 4.5 cm, tumor invades through full thickness of colon, to the seroma and involve the Small Bowel, Surgical Margins Were Negative, 24 Out Of 64 Lymph Nodes Were Positive, Extracapsular Extension Identified, Multiple Satellite Tumor Deposits Present, Liver Biopsy Showed Metastatic Adenocarcinoma.      06/14/2016 Miscellaneous    Tumor MMR normal, MSI stable     06/14/2016 Miscellaneous    Foundation one genomic testing showed K-ras G12 D mutation, APC and TP53 mutation. No BRAF and NRAS mutation. MSI-stable, tumor burden low.    07/06/2016 Tumor Marker    CEA 13.69    07/13/2016 - 01/18/2017 Chemotherapy    mFOLFOX, every 2 weeks, started on 07/14/2015, Avastin added from cycle 3  Oxaliplatin dose to '60mg'$ /m2 due to side effects and  some cytopenia on 09/21/16  Changed to FOLFIRINOX starting cycle 7 and Reduced  Due to neuropathy hold Oxaliplatin and add Irinotecan with neulasta on day 3 starting with cycle 7 Add low dose Oxaliplatin with cycle 8.  Due to his worsening neuropathy, and good response to chemotherapy, I  previously stopped oxaliplatin from cycle 11, and continue FOLFIRI and avastin       07/27/2016 Tumor Marker    CEA 15.85    09/04/2016 Imaging    Ct C/A/P W Contrast IMPRESSION: Interval right colectomy. Stable small liver metastasis in the inferior right hepatic lobe. Stable mild porta hepatis and aortocaval lymphadenopathy. Stable mild mediastinal lymphadenopathy. No new or progressive metastatic disease identified within the chest, abdomen, or pelvis.    12/19/2016 PET scan    IMPRESSION: 1. Right hemicolectomy, with resolution of the prior hypermetabolic activity inferiorly in the right hepatic lobe, in several mediastinal lymph nodes, and in lymph nodes in the retroperitoneum and porta hepatis. No residual hypermetabolic or enlarged lymph nodes are identified. 2. Low-grade diffuse skeletal metabolic activity is likely therapy related. 3. Coronary atherosclerosis. 4. 3 by 4 mm right middle lobe pulmonary nodule is stable, not appreciably hypermetabolic, but below sensitive PET-CT size thresholds. This may warrant surveillance.    01/11/2017 Imaging    MR Abdomen W WO Contrast IMPRESSION: 1. No acute findings within the abdomen. Previously noted liver metastasis has resolved in the interval. No new lesions.    02/01/2017 - 09/2017 Chemotherapy    Maintenance therapy, Xeloda '2000mg'$  (1000 mg/m2)  q12h on day 1-14 every 21 days plus AVASTIN, starting 02/01/2017.  stopped after 12 days due to poor tolerance on 02/14/17  Changed to maintenance 5-FU and avastin every 2 weeks starting on 02/22/17, stopped in 09/2017.       04/10/2017 Imaging    IMPRESSION: 1. Status post right hemicolectomy without findings for recurrent tumor. 2. No worrisome hepatic lesions. Treated disease with only a small residual low attenuation lesion in the right hepatic lobe. 3. No recurrent mediastinal or abdominal lymphadenopathy.     07/09/2017 PET scan    PET 07/09/17 IMPRESSION: 1. Status post right  hemicolectomy, without findings of hypermetabolic recurrent or metastatic disease. 2. A right middle lobe pulmonary nodule is unchanged. However, there is a right lower lobe 5 mm pulmonary nodule which is felt to be new and enlarged compared to prior exams. Suspicious for an isolated pulmonary metastasis. Consider CT follow-up at 3-6 months. 3. Age advanced coronary artery atherosclerosis. Recommend assessment of coronary risk factors and consideration of medical therapy. 4. Borderline ascending aortic dilatation, 4.0 cm      10/10/2017 Imaging    IMPRESSION: 1. Several (at least 10) subcentimeter pulmonary nodules scattered in both lungs, predominantly in the lower lobes, all new/increased, most compatible with enlarging pulmonary metastases, largest 8 mm in the right lower lobe. 2. No additional findings of new or progressive metastatic disease. No recurrent adenopathy. Stable small low-attenuation lesion in the inferior right liver lobe compatible with treated metastasis. No new liver metastases. 3. Stable ectatic 4.0 cm ascending thoracic aorta. Recommend annual imaging followup by CTA or MRA.  4. Stable mild splenomegaly.     10/11/2017 - 12/2017 Chemotherapy    FOLFIRI and Avastin every 2 weeks starting 10/11/17, irnotecan stopped in 12/2017 due to disease progression      01/08/2018 Imaging    01/08/2018 CT CAP IMPRESSION: 1. Progressive hepatic and pulmonary metastatic disease. 2. Ascending Aortic aneurysm NOS (ICD10-I71.9).    01/08/2018  Progression    01/08/2018 CT CAP IMPRESSION: 1. Progressive hepatic and pulmonary metastatic disease. 2. Ascending Aortic aneurysm NOS (ICD10-I71.9).    01/10/2018 - 03/07/2018 Chemotherapy    FOLFOX and Avastin every 2 weeks starting on 01/10/2018. Stopped due to disease progression     03/19/2018 Progression    03/19/2018 CT CAP IMPRESSION: 1. Interval increase in size of dominant nodule within the right lower lobe. There are a few  additional nodules which have increased in size. Multiple additional small bilateral pulmonary nodules are grossly similar. 2. Slight interval increase in size of lesion within the right hepatic lobe. 3. Interval increase in soft tissue at the surgical anastomosis involving the small bowel within the central abdomen. Locally recurrent disease not excluded.     04/07/2018 - 04/2018 Chemotherapy    Third lineXeloda '1500mg'$  BID 7 days on/7 days off and Avastin every 2 weeksstarting 04/07/18 stopped after 1 cycle and 4 days because he started Clinical trail.     05/22/2018 Imaging    CT CAP from 05/22/18 at [facility] FINDINGS:   LINES AND TUBES: None.  LOWER THORAX: Please see same day CT chest for findings above the diaphragm.Marland Kitchen  HEPATOBILIARY: Low-attenuation lesion in segment 5 has minimally increased in size from prior measuring 2.4 x 2.0 cm, previously 2.3 x 1.8 cm when remeasured (8:42). The gallbladder is present and otherwise unremarkable. No biliary dilatation.   SPLEEN: Unremarkable. PANCREAS: Unremarkable.  ADRENALS: Unremarkable. KIDNEYS/URETERS: Unremarkable.  BLADDER: Decompressed making further evaluation difficult. PELVIC/REPRODUCTIVE ORGANS: Unremarkable.  GI TRACT: No dilated or thick walled loops of bowel. Sequelae of right hemicolectomy and appendectomy.   PERITONEUM/RETROPERITONEUM AND MESENTERY: Minimally decreased spiculated mesenteric soft tissue density at the site of enteroenteric anastomosis, now measuring roughly 2.6 x 1.4 cm, previously 3.3 x 3.1 cm (8:54). There is also a mild amount of stranding near the anastomosis within the mid abdomen (8:54)   LYMPH NODES: Borderline enlarged 1.0 cm porta hepatic lymph node, previously 0.7 cm (8:26). VESSELS: The aorta is normal in caliber.  No significant calcified atherosclerotic disease. Though the contrast timing is suboptimal for evaluation of the portal venous system, the portal venous system is appears patent. The  hepatic veins and IVC are unremarkable.  BONES AND SOFT TISSUES: Small fat-containing ventral hernia..    05/24/2018 - 08/02/2018 Chemotherapy    [id number] with palbociclib Lucia Bitter) '100mg'$  3 weeks on/1 week off and binimetinib 15 mg BID starting 05/24/18. Stopped 08/02/18 due to disease progression.     08/02/2018 Imaging    CT CAP 08/02/18 [facility] IMPRESSION:  Since 05/22/2018:  -Unchanged to mildly decreased in size segment 6 hepatic lesion.  -Unchanged irregular soft tissue density at the enteroenteric anastomosis.  -Increase in size of an irregular soft tissue density at the enterocolic anastomosis. This is new since 03/19/2018.  -Mild decrease in size of porta hepatis lymph node.  IMPRESSION:  Slight interval increase in multiple metastatic pulmonary nodules.  New likely malignant small right pleural effusion. Interval prominent right infrahilar lymph nodes; metastasis is in the differential. Mild interlobular septal thickening in the right middle and lower lobes is concerning for lymphangitic tumor spread. Attention on follow-up is suggested.      08/29/2018 Imaging    CT AP 08/29/18 IMPRESSION: 1. No acute CT findings of the abdomen or pelvis to explain abdominal pain.  2. Evidence of worsening metastatic colon malignancy status post right colon resection, including increasing mesenteric nodularity, increasing peritoneal nodularity, enlarging liver metastasis, and new and  enlarging pulmonary nodules.  3. Trace perihepatic ascites, of uncertain etiology, although presumably malignant given peritoneal metastatic disease.    09/02/2018 Imaging    CT chest 09/02/18  IMPRESSION: 1. Unfortunately there is interval increase in size of bilateral pulmonary nodules consistent with progression of pulmonary metastasis. 2. New small moderate RIGHT pleural effusion. 3. Interval increase in size of small mediastinal lymph nodes most consistent with metastatic  adenopathy. 4. Interval increase in size of hepatic metastasis in the inferior RIGHT hepatic lobe compared to CT 03/19/2018.    09/04/2018 -  Chemotherapy    oral Ragorafinib '80mg'$  daily 3 weeks on, 1 week off every 28 days as tolerated and IV Nivolumab '3mg'$ /kg q2weeks starting 09/04/18      CURRENT THERAPY:  oral Ragorafinib '80mg'$  daily 3 weeks on, 1 week off and IV Nivolumab '3mg'$ /kg q2weeks starting 09/04/18   INTERVAL HISTORY:  Nicholas Lowery is here for a follow up and treatment. He presents to the clinic today by himself. I called his wife to be included in the visit today. Since last visit he went to ED for severe abdominal pain. CT scan showed no SBO, but did show disease progression which was the cause of pain.  Since returning home yesterday. He still has uncontrolled pain. He is currently taking Fentanyl patch 12 MCG/hr every 3 days but his pain returns sooner than 3 days. He is also taking Dilaudid 2 tabs every 4 hours. He feels Dilaudid also does not last long enough. He has oxycodone but stopped due to was nauseous with vomiting. This pain does not allow him to sleep well. He has also tried sleep medication as well.  He denies suicidal or self harm thought or ideology. He has changed his will to notes DNR/DNI.   REVIEW OF SYSTEMS:   Constitutional: Denies fevers, chills or abnormal weight loss (+) Trouble sleeping.  Eyes: Denies blurriness of vision Ears, nose, mouth, throat, and face: Denies mucositis or sore throat Respiratory: Denies cough, dyspnea or wheezes Cardiovascular: Denies palpitation, chest discomfort or lower extremity swelling Gastrointestinal:  Denies nausea, heartburn or change in bowel habits (+) Mid abdominal pain  Skin: Denies abnormal skin rashes Lymphatics: Denies new lymphadenopathy or easy bruising Neurological:Denies numbness, tingling or new weaknesses Behavioral/Psych: Mood is stable, no new changes  All other systems were reviewed with the patient  and are negative.  MEDICAL HISTORY:  Past Medical History:  Diagnosis Date   Anxiety    Cancer of ascending colon ([place])    Hypertension    Seasonal allergies     SURGICAL HISTORY: Past Surgical History:  Procedure Laterality Date   APPENDECTOMY  1992   CATARACT EXTRACTION W/ INTRAOCULAR LENS IMPLANT Left 04/2016   COLON SURGERY     COLONOSCOPY W/ BIOPSIES AND POLYPECTOMY  04/2015   INGUINAL HERNIA REPAIR Right 1982   IR RADIOLOGIST EVAL & MGMT  01/16/2017   KNEE ARTHROSCOPY W/ MENISCECTOMY Right 1998   LAPAROSCOPIC RIGHT COLECTOMY N/A 06/14/2016   Procedure: LAPAROSCOPIC HAND ASSISTED HEMICOLECTOMY AND SMALL BOWEL RESECTION.;  Surgeon: [name], MD;  Location: [place];  Service: General;  Laterality: N/A;   LIVER BIOPSY Right 06/14/2016   Procedure: LIVER BIOPSY;  Surgeon: [name], MD;  Location: [place];  Service: General;  Laterality: Right;  Right Inferior Liver   PORTACATH PLACEMENT N/A 07/06/2016   Procedure: INSERTION PORT-A-CATH;  Surgeon: [name], MD;  Location: [place];  Service: General;  Laterality: N/A;   VASECTOMY  2005  I have reviewed the social history and family history with the patient and they are unchanged from previous note.  ALLERGIES:  is allergic to penicillins and xeloda [capecitabine].  MEDICATIONS:  Current Outpatient Medications  Medication Sig Dispense Refill   amLODipine (NORVASC) 10 MG tablet Take 1 tablet (10 mg total) by mouth daily. 30 tablet 5   fentaNYL (DURAGESIC) 12 MCG/HR Place 1 patch onto the skin every 3 (three) days. 5 patch 0   gabapentin (NEURONTIN) 300 MG capsule Take 1 capsule (300 mg total) by mouth 3 (three) times daily. 90 capsule 1   HYDROmorphone (DILAUDID) 2 MG tablet Take 1-2 tablets (2-4 mg total) by mouth every 3 (three) hours as needed for severe pain. 120 tablet 0   LORazepam (ATIVAN) 1 MG tablet Take 1 tablet (1 mg total) by mouth every 4 (four) hours as needed for anxiety  or sleep (nausea). 30 tablet 0   losartan (COZAAR) 100 MG tablet Take 1 tablet (100 mg total) by mouth daily. 30 tablet 0   Multiple Vitamin (MULTIVITAMIN WITH MINERALS) TABS tablet Take 1 tablet by mouth daily.     omeprazole (PRILOSEC) 20 MG capsule TAKE ONE CAPSULE BY MOUTH TWICE DAILY BEFORE A MEAL (Patient taking differently: Take 20 mg by mouth daily. ) 60 capsule 2   oxyCODONE (ROXICODONE) 15 MG immediate release tablet Take 1 tablet (15 mg total) by mouth every 3 (three) hours as needed for moderate pain. 30 tablet 0   polyethylene glycol (MIRALAX / GLYCOLAX) 17 g packet Take 17 g by mouth daily. 14 each 0   prochlorperazine (COMPAZINE) 10 MG tablet Take 1 tablet (10 mg total) by mouth every 6 (six) hours as needed for nausea or vomiting. 30 tablet 3   regorafenib (STIVARGA) 40 MG tablet Take 2 tablets (80 mg total) by mouth daily. Take for 3 weeks on, 1 week off, repeat every 4 weeks. Take with a low fat and low calorie meal 42 tablet 2   senna-docusate (SENOKOT-S) 8.6-50 MG tablet Take 2 tablets by mouth 2 (two) times daily.     sildenafil (REVATIO) 20 MG tablet Take 60-100 mg by mouth See admin instructions. Take 60-100 mg by mouth at least 30 minutes prior to sexual activity     simethicone (MYLICON) 80 MG chewable tablet Chew 1 tablet (80 mg total) by mouth 4 (four) times daily as needed for flatulence. 30 tablet 0   venlafaxine XR (EFFEXOR-XR) 150 MG 24 hr capsule Take 1 capsule (150 mg total) by mouth daily with breakfast.     No current facility-administered medications for this visit.     PHYSICAL EXAMINATION: ECOG PERFORMANCE STATUS: 2 - Symptomatic, <50% confined to bed  Vitals:   09/04/18 1441  BP: (!) 127/104  Pulse: (!) 101  Resp: 20  Temp: 97.6 F (36.4 C)  SpO2: 99%   Filed Weights   09/04/18 1441  Weight: 188 lb 9.6 oz (85.5 kg)    GENERAL:alert, no distress and comfortable SKIN: skin color, texture, turgor are normal, no rashes or significant  lesions EYES: normal, Conjunctiva are pink and non-injected, sclera clear OROPHARYNX:no exudate, no erythema and lips, buccal mucosa, and tongue normal  NECK: supple, thyroid normal size, non-tender, without nodularity LYMPH:  no palpable lymphadenopathy in the cervical, axillary or inguinal LUNGS: clear to auscultation and percussion with normal breathing effort HEART: regular rate & rhythm and no murmurs and no lower extremity edema ABDOMEN:abdomen soft, non-tender and normal bowel sounds Musculoskeletal:no cyanosis of digits and  no clubbing  NEURO: alert & oriented x 3 with fluent speech, no focal motor/sensory deficits  LABORATORY DATA:  I have reviewed the data as listed CBC Latest Ref Rng & Units 09/04/2018 08/31/2018 08/30/2018  WBC 4.0 - 10.5 K/uL 6.8 4.6 8.1  Hemoglobin 13.0 - 17.0 g/dL 15.0 12.7(L) 14.1  Hematocrit 39.0 - 52.0 % 46.2 38.9(L) 42.6  Platelets 150 - 400 K/uL 233 177 220     CMP Latest Ref Rng & Units 09/04/2018 08/31/2018 08/30/2018  Glucose 70 - 99 mg/dL 93 93 114(H)  BUN 6 - 20 mg/dL '12 13 12  '$ Creatinine 0.61 - 1.24 mg/dL 1.13 0.95 1.39(H)  Sodium 135 - 145 mmol/L 138 138 139  Potassium 3.5 - 5.1 mmol/L 4.2 4.0 4.0  Chloride 98 - 111 mmol/L 99 102 101  CO2 22 - 32 mmol/L '27 24 25  '$ Calcium 8.9 - 10.3 mg/dL 10.0 9.3 9.9  Total Protein 6.5 - 8.1 g/dL 8.2(H) - 7.5  Total Bilirubin 0.3 - 1.2 mg/dL 1.2 - 1.1  Alkaline Phos 38 - 126 U/L 101 - 74  AST 15 - 41 U/L 57(H) - 24  ALT 0 - 44 U/L 98(H) - 21      RADIOGRAPHIC STUDIES: I have personally reviewed the radiological images as listed and agreed with the findings in the report. No results found.   ASSESSMENT & PLAN:  Nicholas Lowery is a 58 y.o. male with   1. Right colon cancer with liver, node and lung metastasis, pT4bN2bM1, stage IV, MSI-stable, KRAS G12D mutation (+) -He was diagnosed in 04/2015. He is s/p small bowel resection, adjuvant mFOLFOX which was switched to FOLFIRINOX and then dose  reduced to FOLFIRI. -He unfortunately he progressed on maintenance 5-FU, FOLFIRI and FOLFOX.  -Hestarted3rd line Xeloda andAvastinin 03/2018 but after 1 cycleand 3 days treated [id number]'100mg'$  3 weeks on/1 week off andbinimetinib 15 mgBID starting 05/24/18 with Dr. Truman Hayward -Unfortunately he progressed on [place] and was released from [facility] care. --He has developed worsening abdominal pain. CT Chest/AP from 08/2018 was negative for SBO but showed progressive metastatic cancer in colon, peritoneal, mesentery and new right sided pleural effusion and new pulmonary nodules. I reviewed the images in person and discussed in great detail with patient today.  -I recommend him to start next line therapy with regorafenib and Nivo based on the REGONIVO clinical trial data (JCO 37(15), 2019).  Potential side effects of each agent were discussed with him in detail again, he proceed. -goal of therapy is palliative, to prolong his life and improve his quality of life. -Labs reviewed, CBC, CMP and urine protein WNL except AST 57 and ALT 98. CEA is still pending. Overall adequate to start Nivo today and start Ragorafinib '80mg'$  tomorrow   -I encouraged him to contact clinic if he experiences significant  -F/u Evisit next week for toxicity check.   2. Upper abdominal pain  -For the last few weeks he has had constant upper abdominal pain radiating across base of ribcage with occasion cramping (4-8/10).  -I again advised him to avoid alcohol consumption as this can further effect his liver and ability to continue treatment. He is willing to reduce his alcohol intake.  -CT AP from 08/29/18 showed worsening metastatic colon malignancy and mesenteric and peritoneal nodularity.  -His worsening pain is related to his disease progression exacerbated by his anxiety and depression. He was recently hospitalized for pain management. -He is currently  taking Fentanyl patch 12.5 MCG/hr  every 3 days and Dilaudid '4mg'$  every 4 hours. His pain medication is not controlling his pain enough. -He has oxycodone '15mg'$  and has tried but stopped due to nausea and vomiting. -Starting 09/05/17 will increase fentanyl to 2 patches every 3 days. I recommend he try oxycodone 15-'20mg'$  again, but if not tolerable continue dilaudid '4mg'$ ,  every 3 hours as needed. He should take mainly at night and try to use less often during the day.  -I will also increase his Gabapentin '300mg'$  at night to help him sleep. I advised him not to take Gabapentin and Ativan at the same time. (09/04/18) -WE again reviewed the side effects of narcotics, and narcotics dependence.  I discussed if we can improve pain with chemo, I will reduce narcotics use. He is agreeable.    3. Depression, Anxiety, Insomnia  -Pt has long-standing history of depression, has been on Prozac for decades, it was very well controlled in the past.  -He has good family and social support. -Currently onEffexor and Ativan. I advised him not to take Gabapentin and Ativan at the same time.  -He has become more depressed againwith disease progression and stress of prognosis.  -I again strongly encouraged him to f/u with his Psychiatrist, he is agreeable.  -I also offered chance to speak with our [place] for counseling.  -He denies suicidal or self harm thought or ideology.   4. Hypertension  -OnAmlodipineand Lisinopril, I refilled today -He does not want to follow-up with his primary care physician. -BP at 127/104 today (09/04/18), uncontrolled, partially related to his pain and anxiety.  I encouraged him to monitor at home. -We discussed that regorafenib may increase his blood pressure.  5. Anemia of iron deficiency and chemo  -He has mild anemia, likely related to his colon cancer bleeding. -Hg normalized today at 15 (09/04/18). Continueoral iron supplements   6. Anorexia and weight loss    -Secondary to underlying malignancy -He will continue nutritionalsupplement -He continues to have weight loss especially with worsened abdominal pain and disease progression.  -I discussed Marinol to boost appetite and help with nausea. He is interested in trying it. (09/04/18) -He will continue ativan.   7. Goal of care discussion, on DNR/DNI  -The patient understands the goal of care is palliative.  -He notes he has updated his will to include a DNR.    Plan -I called in and Increased gabapentin 3000 nightly, he will gradually increase to tid if needed.  -I increased his pain medication today  -he will fill ativan prescription and use it as need for insomnia and anxiety  -Labs reviewed and adequate to proceed with first cycle Nivo today and start Ragorafinib '80mg'$  daily tomorrow.  -Evisit in 1 week with Lacie  -Lab, f/u and nivo in 2 weeks   -I called his wife and spoke with her during pt's visit today    No problem-specific Assessment & Plan notes found for this encounter.   No orders of the defined types were placed in this encounter.  All questions were answered. The patient knows to call the clinic with any problems, questions or concerns. No barriers to learning was detected. I spent 30 minutes counseling the patient face to face. The total time spent in the appointment was 40 minutes and more than 50% was on counseling and review of test results     Truitt Merle, MD 09/04/2018   I, Joslyn Devon, am acting as scribe for Truitt Merle, MD.   I have  reviewed the above documentation for accuracy and completeness, and I agree with the above.

## 2018-09-02 NOTE — Progress Notes (Addendum)
Nicholas Lowery   DOB:1961-02-02   ZL#:[id number]   TJQ#:[id number]  Oncology follow up   Subjective: Patient is well-known to me, under my care for his metastatic colon cancer.  He was admitted last week for along with worsening anxiety and depression.  He is currently on fentanyl patch, oral and IV Dilaudid, he was sitting, tight slightly low.  Afebrile, stable vital sign.   Objective:  Vitals:   09/02/18 0515 09/02/18 0848  BP: 136/90 (!) 145/105  Pulse: 88 87  Resp: 18 16  Temp: 98.5 F (36.9 C) 97.6 F (36.4 C)  SpO2: 98% 97%    Body mass index is 27.46 kg/m.  Intake/Output Summary (Last 24 hours) at 09/02/2018 1619 Last data filed at 09/02/2018 1300 Gross per 24 hour  Intake 720 ml  Output 0 ml  Net 720 ml     Sclerae unicteric  Oropharynx clear  No peripheral adenopathy  Lungs clear -- no rales or rhonchi  Heart regular rate and rhythm  Abdomen soft, diffuse tenderness   MSK no focal spinal tenderness, no peripheral edema  Neuro nonfocal   CBG (last 3)  No results for input(s): GLUCAP in the last 72 hours.   Labs:  Urine Studies No results for input(s): UHGB, CRYS in the last 72 hours.  Invalid input(s): UACOL, UAPR, USPG, UPH, UTP, UGL, UKET, UBIL, UNIT, UROB, ULEU, UEPI, UWBC, URBC, UBAC, CAST, [place], [place]  Basic Metabolic Panel: Recent Labs  Lab 08/29/18 0929 08/30/18 1352 08/31/18 0532  NA 138 139 138  K 4.0 4.0 4.0  CL 100 101 102  CO2 24 25 24   GLUCOSE 101* 114* 93  BUN 8 12 13   CREATININE 1.05 1.39* 0.95  CALCIUM 10.1 9.9 9.3   GFR Estimated Creatinine Clearance: 90.7 mL/min (by C-G formula based on SCr of 0.95 mg/dL). Liver Function Tests: Recent Labs  Lab 08/29/18 0929 08/30/18 1352  AST 38 24  ALT 21 21  ALKPHOS 91 74  BILITOT 1.5* 1.1  PROT 8.1 7.5  ALBUMIN 4.8 4.2   Recent Labs  Lab 08/29/18 0929 08/30/18 1352  LIPASE 24 26   No results for input(s): AMMONIA in the last 168 hours. Coagulation profile No results for  input(s): INR, PROTIME in the last 168 hours.  CBC: Recent Labs  Lab 08/29/18 0929 08/30/18 1352 08/31/18 0532  WBC 6.8 8.1 4.6  NEUTROABS 4.9 5.8  --   HGB 15.9 14.1 12.7*  HCT 47.3 42.6 38.9*  MCV 98.3 99.5 99.0  PLT 249 220 177   Cardiac Enzymes: No results for input(s): CKTOTAL, CKMB, CKMBINDEX, TROPONINI in the last 168 hours. BNP: Invalid input(s): POCBNP CBG: No results for input(s): GLUCAP in the last 168 hours. D-Dimer No results for input(s): DDIMER in the last 72 hours. Hgb A1c No results for input(s): HGBA1C in the last 72 hours. Lipid Profile No results for input(s): CHOL, HDL, LDLCALC, TRIG, CHOLHDL, LDLDIRECT in the last 72 hours. Thyroid function studies No results for input(s): TSH, T4TOTAL, T3FREE, THYROIDAB in the last 72 hours.  Invalid input(s): FREET3 Anemia work up No results for input(s): VITAMINB12, FOLATE, FERRITIN, TIBC, IRON, RETICCTPCT in the last 72 hours. Microbiology No results found for this or any previous visit (from the past 240 hour(s)).    Studies:  No results found.  Assessment: 58 y.o. with past medical history of metastatic colon cancer, recently progressed clinical trial, presented with intractable abdominal pain, and worsening anxiety and depression.  1.  Intractable abdominal pain secondary to  peritoneal metastasis 2.  Metastatic colon cancer 3.  Worsening depression and anxiety due to recent cancer progression and pain 4. Constipation  5. HTN   Plan:  -I really appreciate the excellent care from the hospitalist and palliative care medicine  -I agree with current pain management regimen, he is on fentanyl 12.5mcg/hr and oral Dilaudid 4 mg as needed, adjustment per palliative care team.  -Low-dose gabapentin was added yesterday, I spoke with palliative care team, will consider switch to Cymbalta, is better for his neuropsych pain.  He is on Effexor, I spoke with our pharmacist, if he is both Cymbalta and Effexor, he will  increase the risk of serotonin syndrome, will monitor closely. -I will get a CT chest wo contrast to complete his staging due to his disease progression in abdomen  -I again discussed that we are going to start him on next  Line chemotherapy with nivolumab and regorafenib later this week.  -I strongly encouraged him to follow-up with his psychiatrist more frequently, due to significant worsening anxiety and depression, he agrees  -will stop dexa due to the interaction with his upcoming immunotherapy Nivo  -if he is able to be discharged home tomorrow, I will see him back in my clinic on 4/22 as scheduled   Truitt Merle, MD 09/02/2018  4:19 PM

## 2018-09-02 NOTE — Progress Notes (Addendum)
Progress Note    Nicholas Lowery  UJW:[id number] DOB: 1961/03/24  DOA: 08/30/2018 PCP: Juliette Alcide, MD    Brief Narrative:   Chief complaint: Intractable abdominal pain  Medical records reviewed and are as summarized below:  Nicholas Lowery is an 58 y.o. male with past medical history significant for metastatic colon cancer s/p right colon resection in phase 1 clinical trials with most recent CT scan abdomen 4/16 showing worsening metastatic colon disease; who presents with uncontrolled abdominal pain.  Assessment/Plan:   Active Problems:   Metastatic colon cancer to liver (HCC)   HTN (hypertension)   Cancer related pain   Advanced care planning/counseling discussion   Palliative care by specialist   Anxiety associated with cancer diagnosis  Uncontrolled cancer related pain, metastatic colon cancer -Patient to start a new clinical trial per oncology as an outpatient.  Dr. Mosetta Putt saw and will obtain CT of the chest to complete imaging. -Palliative care consulted recommended for patient to be on 12.5 mcg/h fentanyl patch.  Was tried on oral Dilaudid increased 1->2->4mg  p.o. every 2 hours for severe pain, but was ultimately discontinued as provided inadequate relief.  On 4/20 patient started on oxycodone IR 15 mg every 4 hours as needed for pain. Gabapentin 100 mg increased to 200 mg nightly. (Pain medication adjustments per palliative care) -Dexamethasone 1 mg p.o. twice daily for fatigue and appetite started on 4/19,  and discontinued on 4/20 -Continue current bowel regimen  -Appreciate palliative care consultative services, will follow-up for any further recommendation  Nausea and vomiting: Resolved -Symptoms occurred after pain medications in the emergency department.  Patient does not report any recurrence of symptoms since admission.  Hypertension: Blood pressure 136/92-149/105 diastolic blood pressures elevated. -Continue home medications of amlodipine   -Increase losartan from 50 mg to 100 mg daily  Insomnia -Continue trazodone for sleep  Anxiety -Continue Effexor -Continue Ativan as needed for anxiety  Constipation -Last bowel movement on 4/15  -Continue Senokot twice daily -Continue MiraLAX  Bloating -Simethicone as needed  Indigestion -Continue Protonix  Body mass index is 27.46 kg/m.   Family Communication/Anticipated D/C date and plan/Code Status   DVT prophylaxis: Lovenox ordered.  Patient refused on 4/18 and 4/19. Code Status: DNR  Family Communication: No family present at bedside Disposition Plan: Likely discharge home tomorrow once pain under better control   Medical Consultants:    Palliative care   Anti-Infectives:    None  Subjective:   Patient reports overnight that he did not sleep well due to combination of things including other patients on the hall.  Reports that pain level is down to approximately 5 out of 10 at baseline.  Still makes note of right chest wall discomfort and has not had a bowel movement.  Objective:    Vitals:   09/01/18 0912 09/01/18 2052 09/02/18 0515 09/02/18 0848  BP: (!) 149/104 (!) 142/107 136/90 (!) 145/105  Pulse: 76 (!) 105 88 87  Resp: 18 18 18 16   Temp: 97.6 F (36.4 C) 98.6 F (37 C) 98.5 F (36.9 C) 97.6 F (36.4 C)  TempSrc: Oral Oral Oral Axillary  SpO2: 97% 95% 98% 97%  Weight:  86.8 kg    Height:        Intake/Output Summary (Last 24 hours) at 09/02/2018 1310 Last data filed at 09/02/2018 0848 Gross per 24 hour  Intake 840 ml  Output 0 ml  Net 840 ml   Filed Weights   08/30/18 2153 08/31/18 2053 09/01/18 2052  Weight: 86.9 kg 86.8 kg 86.8 kg    Exam: Constitutional: Older male NAD, calm, comfortable in chair Eyes: PERRL, lids and conjunctivae normal ENMT: Mucous membranes are moist. Posterior pharynx clear of any exudate or lesions.   Neck: normal, supple, no masses, no thyromegaly Respiratory: clear to auscultation bilaterally, no  wheezing, no crackles. Normal respiratory effort. No accessory muscle use.  Cardiovascular: Regular rate and rhythm, no murmurs / rubs / gallops. No extremity edema. 2+ pedal pulses. No carotid bruits.  Abdomen: no tenderness, bowel sounds present Musculoskeletal: no clubbing / cyanosis.  Muscle cramp noted of upper right substernal border. skin: no rashes, lesions, ulcers. No induration Neurologic: CN 2-12 grossly intact. Sensation intact, DTR normal. Strength 5/5 in all 4.  Psychiatric: Normal judgment and insight. Alert and oriented x 3.  Anxious mood.    Data Reviewed:   I have personally reviewed following labs and imaging studies:  Labs: Labs show the following:   Basic Metabolic Panel: Recent Labs  Lab 08/29/18 0929 08/30/18 1352 08/31/18 0532  NA 138 139 138  K 4.0 4.0 4.0  CL 100 101 102  CO2 24 25 24   GLUCOSE 101* 114* 93  BUN 8 12 13   CREATININE 1.05 1.39* 0.95  CALCIUM 10.1 9.9 9.3   GFR Estimated Creatinine Clearance: 90.7 mL/min (by C-G formula based on SCr of 0.95 mg/dL). Liver Function Tests: Recent Labs  Lab 08/29/18 0929 08/30/18 1352  AST 38 24  ALT 21 21  ALKPHOS 91 74  BILITOT 1.5* 1.1  PROT 8.1 7.5  ALBUMIN 4.8 4.2   Recent Labs  Lab 08/29/18 0929 08/30/18 1352  LIPASE 24 26   No results for input(s): AMMONIA in the last 168 hours. Coagulation profile No results for input(s): INR, PROTIME in the last 168 hours.  CBC: Recent Labs  Lab 08/29/18 0929 08/30/18 1352 08/31/18 0532  WBC 6.8 8.1 4.6  NEUTROABS 4.9 5.8  --   HGB 15.9 14.1 12.7*  HCT 47.3 42.6 38.9*  MCV 98.3 99.5 99.0  PLT 249 220 177   Cardiac Enzymes: No results for input(s): CKTOTAL, CKMB, CKMBINDEX, TROPONINI in the last 168 hours. BNP (last 3 results) No results for input(s): PROBNP in the last 8760 hours. CBG: No results for input(s): GLUCAP in the last 168 hours. D-Dimer: No results for input(s): DDIMER in the last 72 hours. Hgb A1c: No results for  input(s): HGBA1C in the last 72 hours. Lipid Profile: No results for input(s): CHOL, HDL, LDLCALC, TRIG, CHOLHDL, LDLDIRECT in the last 72 hours. Thyroid function studies: No results for input(s): TSH, T4TOTAL, T3FREE, THYROIDAB in the last 72 hours.  Invalid input(s): FREET3 Anemia work up: No results for input(s): VITAMINB12, FOLATE, FERRITIN, TIBC, IRON, RETICCTPCT in the last 72 hours. Sepsis Labs: Recent Labs  Lab 08/29/18 0929 08/30/18 1352 08/31/18 0532  WBC 6.8 8.1 4.6    Microbiology No results found for this or any previous visit (from the past 240 hour(s)).  Procedures and diagnostic studies:  No results found.  Medications:   . amLODipine  10 mg Oral Daily  . enoxaparin (LOVENOX) injection  40 mg Subcutaneous Q24H  . fentaNYL  1 patch Transdermal Q72H  . gabapentin  200 mg Oral QHS  . losartan  50 mg Oral Daily  . magnesium citrate  0.5 Bottle Oral Once  . multivitamin with minerals  1 tablet Oral Daily  . pantoprazole  40 mg Oral Daily  . polyethylene glycol  17 g Oral Daily  .  senna-docusate  2 tablet Oral BID  . venlafaxine XR  150 mg Oral Q breakfast   Continuous Infusions:   LOS: 0 days   Nicholas Lowery A Donterius Filley  [facility]   *Please refer to [email or web address], password [email or web address] to get updated schedule on who will round on this patient, as hospitalists switch teams weekly. If 7PM-7AM, please contact night-coverage at [email or web address], password [email or web address] for any overnight needs.

## 2018-09-03 DIAGNOSIS — K5901 Slow transit constipation: Secondary | ICD-10-CM

## 2018-09-03 MED ORDER — SORBITOL 70 % SOLN
960.0000 mL | TOPICAL_OIL | Freq: Once | ORAL | Status: DC
  Filled 2018-09-03: qty 473

## 2018-09-03 MED ORDER — OXYCODONE HCL 15 MG PO TABS: 15.0000 mg | ORAL_TABLET | ORAL | 0 refills | Status: DC | PRN

## 2018-09-03 MED ORDER — GABAPENTIN 100 MG PO CAPS: 200.0000 mg | ORAL_CAPSULE | Freq: Every day | ORAL | 0 refills | Status: DC

## 2018-09-03 MED ORDER — SENNOSIDES-DOCUSATE SODIUM 8.6-50 MG PO TABS: 2.0000 | ORAL_TABLET | Freq: Two times a day (BID) | ORAL | Status: AC

## 2018-09-03 MED ORDER — SIMETHICONE 80 MG PO CHEW: 80.0000 mg | CHEWABLE_TABLET | Freq: Four times a day (QID) | ORAL | 0 refills | Status: DC | PRN

## 2018-09-03 MED ORDER — LORAZEPAM 1 MG PO TABS: 1.0000 mg | ORAL_TABLET | ORAL | 0 refills | Status: DC | PRN

## 2018-09-03 MED ORDER — LOSARTAN POTASSIUM 100 MG PO TABS: 100.0000 mg | ORAL_TABLET | Freq: Every day | ORAL | 0 refills | Status: DC

## 2018-09-03 MED ORDER — FENTANYL 12 MCG/HR TD PT72: 1.0000 | MEDICATED_PATCH | TRANSDERMAL | 0 refills | Status: DC

## 2018-09-03 MED ORDER — VENLAFAXINE HCL ER 150 MG PO CP24: 150.0000 mg | ORAL_CAPSULE | Freq: Every day | ORAL | Status: DC

## 2018-09-03 NOTE — Discharge Summary (Signed)
Nicholas Lowery, is a 58 y.o. male  DOB 04/18/1961  MRN [id number].  Admission date:  08/30/2018  Admitting Physician  Costin Karlyne Greenspan, MD  Discharge Date:  09/03/2018   Primary MD  Curlene Labrum, MD  Recommendations for primary care physician for things to follow:   -Pain control -Bowel regimen   Discharge Diagnosis    Principal Problem:   Cancer related pain Active Problems:   Metastatic colon cancer to liver ([place])   HTN (hypertension)   Advanced care planning/counseling discussion   Palliative care by specialist   Anxiety associated with cancer diagnosis   Malignant neoplasm of colon [facility])      Past Medical History:  Diagnosis Date   Anxiety    Cancer of ascending colon ([place])    Hypertension    Seasonal allergies     Past Surgical History:  Procedure Laterality Date   APPENDECTOMY  1992   CATARACT EXTRACTION W/ INTRAOCULAR LENS IMPLANT Left 04/2016   COLON SURGERY     COLONOSCOPY W/ BIOPSIES AND POLYPECTOMY  04/2015   INGUINAL HERNIA REPAIR Right 1982   IR RADIOLOGIST EVAL & MGMT  01/16/2017   KNEE ARTHROSCOPY W/ MENISCECTOMY Right 1998   LAPAROSCOPIC RIGHT COLECTOMY N/A 06/14/2016   Procedure: LAPAROSCOPIC HAND ASSISTED HEMICOLECTOMY AND SMALL BOWEL RESECTION.;  Surgeon: [name], MD;  Location: [place];  Service: General;  Laterality: N/A;   LIVER BIOPSY Right 06/14/2016   Procedure: LIVER BIOPSY;  Surgeon: [name], MD;  Location: [place];  Service: General;  Laterality: Right;  Right Inferior Liver   PORTACATH PLACEMENT N/A 07/06/2016   Procedure: INSERTION PORT-A-CATH;  Surgeon: [name], MD;  Location: [place];  Service: General;  Laterality: N/A;   VASECTOMY  2005       HPI  from the history and physical done on the day of admission:    Nicholas Lowery is a 58 y.o. male with medical history significant of metastatic colon cancer status post right colon resection, which is currently advancing and he is to be considered for phase 1 clinical trial, most recent CT scan of the abdomen pelvis done on 4/16 with evidence of worsening metastatic colon malignancy with increased peritoneal nodularity and enlarging liver mets, as well as new and enlarging pulmonary nodule.  Patient also has a history of hypertension, anxiety.  Patient presents with excruciating lower abdominal pain that has been progressive over the last couple weeks.  He was placed gradually on tramadol, hydrocodone, oxycodone without relief.  He came to the emergency room yesterday due to intractable pain, received IV Dilaudid with relief, felt a little bit better and decided to go home on p.o. Dilaudid.  Patient continued to experience excruciating pain unrelieved by oral medications, was unable to sleep last night and this morning he took for 5 mg oxycodone's with some relief.  He called his primary oncologist [name], and patient came to the hospital for admission.  He currently denies any chest pain, denies any shortness of breath.  He denies any fever or chills.  On my evaluation in the ED all of a sudden patient started retching and vomiting.   ED Course: In the ED his vitals are stable, he is slightly tachycardic at low 100s, afebrile.  Satting well on room air.  Blood work obtained yesterday fairly unremarkable, today's blood work pending.  CT scan discussed above.  We are asked to admit for pain control.    Hospital Course:   1.  Uncontrolled cancer related pain, metastatic colon cancer -CT scan of the abdomen and pelvis on admission showed progression metastatic disease, but did show signs of bowel obstruction or any other acute cause for his pain.  Patient to start a new clinical trial per oncology as an outpatient.  [name]  evaluated during hospital stay.  CT scan of the  chest obtained 09/02/18, revealed progression metastatic disease when compared to previous CT from 03/2018 as seen below.  He had also had outside CT scan performed at [facility] in 07/2018 available on care everywhere. -Palliative care consulted starting patient on 12.5 mcg/h fentanyl patch.  Was tried on oral Dilaudid with increases 1->2->4mg  p.o. every 2 hours for severe pain, but was ultimately discontinued as provided inadequate relief.  On 4/20 patient started on oxycodone IR 15 mg every 4 hours as needed for pain.  Gabapentin 100 mg increased to 200 mg nightly.  Pain better controlled on oxycodone IR.  Question increased dose of fentanyl patch but was recommended to titrate dose if needed in outpatient setting after optimization of short acting pain medication. (Pain medication adjustments per palliative care)  -Trial of dexamethasone 1 mg p.o. twice daily for fatigue and appetite started on 4/19,  and discontinued on 4/20 -Patient was continued on bowel regimen as seen below  2.  Nausea and vomiting: Resolved. -Symptoms occurred after pain medications in the emergency department.  Patient does not report any recurrence of symptoms since admission.  3.  Hypertension: Uncontrolled.  Patient's diastolic blood pressures elevated greater than 100 intermittently throughout his hospital stay.  He noted that this is been an ongoing issue.  Home medications included amlodipine 10 mg and losartan 50 mg daily. -Continue home medications of amlodipine  -Increased losartan from 50 mg to 100 mg daily on 4/21 -May warrant further adjustment in outpatient setting  4.  Acute kidney injury: Resolved.  On admission patient noted to have creatinine elevated to 1.39 with BUN 12.  Nephrotoxic agent such as Celebrex were held.  Patient had been given IV fluids with creatinine back to normal limits of 0.95 the following day.  Advised patient regarding possible need to hold Celebrex.  5.  Insomnia  -Continue trazodone for  sleep -Sleep was limited during hospital stay due to patient's and neighboring rooms  5.  Anxiety: Associated with cancer diagnosis -Continue Effexor -Continue Ativan as needed for anxiety  6.  Constipation: Resolved -Last bowel movement on 4/15 prior to admission -Continue scheduled Senokot twice daily -Continue scheduled MiraLAX daily -Prior to discharge on 4/21 patient had a bowel movement prior to requiring smog enema  7.  Bloating/gas -Simethicone as needed  8.  Indigestion -Continue Protonix  9.  Right chest wall cramping/spasm: Patient appears to have a tense right substernal wall muscle.  Imaging studies did not show any metastatic spread to muscle or bone.  Offered muscle relaxant which patient declined need for at this time.  Body mass index is 27.46 kg/m.     Follow UP  Follow-up Information  [facility].   Contact information: [place] [phone]           Consults obtained:   Palliative care   Oncology [name]  Discharge Condition: Stable  Diet: Resume previous diet  Activity recommendation: Activity as tolerated    Discharge Instructions    Discharge instructions   Complete by:  As directed    Please follow-up with [name] tomorrow to discuss CT of your chest results in further detail.  When going out or to appointments please wear mask, utilize proper hygiene, and other social distancing practices.  Adjust your Senokot-S and MiraLAX regimen as needed to stay regular.  Ativan prescription was printed because it was noted to not be on formulary.        Discharge Medications     Allergies as of 09/03/2018      Reactions   Penicillins Other (See Comments)   UNSPECIFIED REACTION  Has patient had a PCN reaction causing immediate rash, facial/tongue/throat swelling, SOB or lightheadedness with hypotension: unknown Has patient had a PCN reaction causing severe rash  involving mucus membranes or skin necrosis: unknown Has patient had a PCN reaction that required hospitalization unknown Has patient had a PCN reaction occurring within the last 10 years: No If all of the above answers are "NO", then may proceed with Cephalosporin use.   Xeloda [capecitabine] Nausea And Vomiting, Other (See Comments)   Foot pain, patient couldn't walk, and bleeding gums      Medication List    STOP taking these medications   celecoxib 200 MG capsule Commonly known as:  CELEBREX   HYDROcodone-acetaminophen 5-325 MG tablet Commonly known as:  Norco   HYDROmorphone 2 MG tablet Commonly known as:  Dilaudid   senna 8.6 MG tablet Commonly known as:  SENOKOT   traMADol 50 MG tablet Commonly known as:  ULTRAM     TAKE these medications   amLODipine 10 MG tablet Commonly known as:  NORVASC Take 1 tablet (10 mg total) by mouth daily.   fentaNYL 12 MCG/HR Commonly known as:  [place] 1 patch onto the skin every 3 (three) days.   gabapentin 100 MG capsule Commonly known as:  NEURONTIN Take 2 capsules (200 mg total) by mouth at bedtime.   LORazepam 1 MG tablet Commonly known as:  ATIVAN Take 1 tablet (1 mg total) by mouth every 4 (four) hours as needed for anxiety or sleep (nausea).   losartan 100 MG tablet Commonly known as:  COZAAR Take 1 tablet (100 mg total) by mouth daily. What changed:    medication strength  how much to take   multivitamin with minerals Tabs tablet Take 1 tablet by mouth daily.   omeprazole 20 MG capsule Commonly known as:  PRILOSEC TAKE ONE CAPSULE BY MOUTH TWICE DAILY BEFORE A MEAL What changed:  See the new instructions.   oxyCODONE 15 MG immediate release tablet Commonly known as:  ROXICODONE Take 1 tablet (15 mg total) by mouth every 3 (three) hours as needed for moderate pain. What changed:    medication strength  how much to take  when to take this  reasons to take this   polyethylene glycol 17 g  packet Commonly known as:  MIRALAX / GLYCOLAX Take 17 g by mouth daily.   prochlorperazine 10 MG tablet Commonly known as:  COMPAZINE Take 1 tablet (10 mg total) by mouth every 6 (six) hours as needed for nausea or vomiting.  regorafenib 40 MG tablet Commonly known as:  STIVARGA Take 2 tablets (80 mg total) by mouth daily. Take for 3 weeks on, 1 week off, repeat every 4 weeks. Take with a low fat and low calorie meal   senna-docusate 8.6-50 MG tablet Commonly known as:  Senokot-S Take 2 tablets by mouth 2 (two) times daily.   sildenafil 20 MG tablet Commonly known as:  REVATIO Take 60-100 mg by mouth See admin instructions. Take 60-100 mg by mouth at least 30 minutes prior to sexual activity   simethicone 80 MG chewable tablet Commonly known as:  MYLICON Chew 1 tablet (80 mg total) by mouth 4 (four) times daily as needed for flatulence.   venlafaxine XR 150 MG 24 hr capsule Commonly known as:  EFFEXOR-XR Take 1 capsule (150 mg total) by mouth daily with breakfast. What changed:  See the new instructions.       Major procedures and Radiology Reports - PLEASE review detailed and final reports for all details, in brief -    Ct Chest Wo Contrast  Result Date: 09/03/2018 CLINICAL DATA:  Colon cancer, stage IV. EXAM: CT CHEST WITHOUT CONTRAST TECHNIQUE: Multidetector CT imaging of the chest was performed following the standard protocol without IV contrast. COMPARISON:  CT chest 03/19/2018 FINDINGS: Cardiovascular: Coronary artery calcification and aortic atherosclerotic calcification. Mediastinum/Nodes: No axillary or supraclavicular adenopathy. Port in the anterior chest wall with tip in distal SVC. Multiple small mediastinal lymph nodes along the paratracheal nodal chains and prevascular nodal stations are slightly increased. For example RIGHT paratracheal lymph node measuring 9 mm increased from 6 mm. Prevascular node anterior to the SVC measures 7 mm increased from 3 mm.  Lungs/Pleura: Unfortunately, there is interval increase in in size bilateral pulmonary nodules. For example, nodule in superior segment of the LEFT lower lobe measures 15 mm increased from 12 mm remeasured. Nodule posterior to the LEFT hilum in the LEFT lower lobe measuring 9 mm (image 79/4) compared to 3 mm. Posterior LEFT lower lobe nodule measuring 9 mm (image 86/4) compares to 5 mm. Similar findings in the RIGHT lung. Increase nodularity along the oblique fissure (image 80/4. Nodular peribronchial thickening in the RIGHT lower lobe measures up to 25 mm increased from 16 mm. New RIGHT pleural effusion Upper Abdomen: Limited view of the liver, kidneys, pancreas are unremarkable. Normal adrenal glands. Low-density lesion inferior LEFT hepatic lobe measures 2.5 cm increased from 2.1 cm on CT 03/19/2018. Musculoskeletal: No aggressive osseous lesion. IMPRESSION: 1. Unfortunately there is interval increase in size of bilateral pulmonary nodules consistent with progression of pulmonary metastasis. 2. New small moderate RIGHT pleural effusion. 3. Interval increase in size of small mediastinal lymph nodes most consistent with metastatic adenopathy. 4. Interval increase in size of hepatic metastasis in the inferior RIGHT hepatic lobe compared to CT 03/19/2018. Electronically Signed   By: Suzy Bouchard M.D.   On: 09/03/2018 10:07   Ct Abdomen Pelvis W Contrast  Result Date: 08/29/2018 CLINICAL DATA:  Abdominal pain, metastatic colon cancer EXAM: CT ABDOMEN AND PELVIS WITH CONTRAST TECHNIQUE: Multidetector CT imaging of the abdomen and pelvis was performed using the standard protocol following bolus administration of intravenous contrast. CONTRAST:  171mL ISOVUE-300 IOPAMIDOL (ISOVUE-300) INJECTION 61% COMPARISON:  CT chest abdomen pelvis, 03/19/2018 FINDINGS: Lower chest: There is a small although increased right pleural effusion. Multiple pulmonary nodules in the included bilateral lung bases, several of which are  new and enlarged compared to prior examination (e.g. Series 4, image 1). Hepatobiliary: Interval enlargement  of a hypodense lesion of the inferior margin of the right lobe of the liver, hepatic segment VI (series 3, image 33). No gallstones, gallbladder wall thickening, or biliary dilatation. Pancreas: Unremarkable. No pancreatic ductal dilatation or surrounding inflammatory changes. Spleen: Normal in size without focal abnormality. Accessory splenule. Adrenals/Urinary Tract: Adrenal glands are unremarkable. Kidneys are normal, without renal calculi, focal lesion, or hydronephrosis. Bladder is unremarkable. Stomach/Bowel: Stomach is within normal limits. No evidence of bowel wall thickening, distention, or inflammatory changes. Evidence of prior right colon resection with adjacent mesenteric nodularity increased compared to prior examination (series 3, image 41). Increasing peritoneal nodularity about the umbilicus and anterior peritoneum (series 3, image 47, 32). Moderate burden of stool in the colon. Vascular/Lymphatic: No significant vascular findings are present. No enlarged abdominal or pelvic lymph nodes. Reproductive: No mass or other abnormality. Other: No abdominal wall hernia or abnormality. Trace perihepatic ascites. Musculoskeletal: No acute or significant osseous findings. IMPRESSION: 1. No acute CT findings of the abdomen or pelvis to explain abdominal pain. 2. Evidence of worsening metastatic colon malignancy status post right colon resection, including increasing mesenteric nodularity, increasing peritoneal nodularity, enlarging liver metastasis, and new and enlarging pulmonary nodules. 3. Trace perihepatic ascites, of uncertain etiology, although presumably malignant given peritoneal metastatic disease. Electronically Signed   By: Eddie Candle M.D.   On: 08/29/2018 12:00   Dg Abd 2 Views  Result Date: 08/21/2018 CLINICAL DATA:  Nausea.  Rule out bowel obstruction. EXAM: ABDOMEN - 2 VIEW  COMPARISON:  CT 03/19/2018. FINDINGS: Nonobstructive bowel gas pattern. No free air, organomegaly or suspicious calcification. No acute bony abnormality. IMPRESSION: No evidence of bowel obstruction or free air. Electronically Signed   By: Rolm Baptise M.D.   On: 08/21/2018 14:07    Micro Results     No results found for this or any previous visit (from the past 240 hour(s)).     Today   Subjective    Nicholas Lowery today states that his pain was under better control overnight.  Still having difficult time sleeping and had not had a bowel movement as of this morning.  Prior to discharge patient noted that he had had a significant bowel movement. Objective   Blood pressure (!) 156/108, pulse 87, temperature 98 F (36.7 C), temperature source Oral, resp. rate 20, height 5\' 10"  (1.778 m), weight 86.7 kg, SpO2 97 %.   Intake/Output Summary (Last 24 hours) at 09/04/2018 0731 Last data filed at 09/03/2018 0900 Gross per 24 hour  Intake 120 ml  Output --  Net 120 ml    Exam  Constitutional:  Male in NAD, calm, comfortable Eyes: PERRL, lids and conjunctivae normal ENMT: Mucous membranes are moist. Posterior pharynx clear of any exudate or lesions.Normal dentition.  Neck: normal, supple, no masses, no thyromegaly Respiratory: clear to auscultation bilaterally, no wheezing, no crackles. Normal respiratory effort. No accessory muscle use.  Cardiovascular: Regular rate and rhythm, no murmurs / rubs / gallops. No extremity edema. Abdomen: Bowel sounds present.  No significant tenderness to palpation appreciated. Musculoskeletal: no clubbing / cyanosis. No joint deformity upper and lower extremities.  Increased muscle tone noted of the right lower sternal border. skin: no rashes, lesions, ulcers. No induration Neurologic: CN 2-12 grossly intact. Strength 5/5 in all 4.  Psychiatric: Normal judgment and insight. Alert and oriented x 3. Normal mood.    Data Review   CBC w Diff:   Lab Results  Component Value Date   WBC 4.6 08/31/2018   HGB  12.7 (L) 08/31/2018   HGB 12.2 (L) 06/17/2018   HGB 13.4 05/17/2017   HCT 38.9 (L) 08/31/2018   HCT 40.5 05/17/2017   PLT 177 08/31/2018   PLT 81 (L) 06/17/2018   PLT 119 (L) 05/17/2017   LYMPHOPCT 14 08/30/2018   LYMPHOPCT 14.5 05/17/2017   MONOPCT 13 08/30/2018   MONOPCT 10.5 05/17/2017   EOSPCT 1 08/30/2018   EOSPCT 1.7 05/17/2017   BASOPCT 1 08/30/2018   BASOPCT 0.2 05/17/2017    CMP:  Lab Results  Component Value Date   NA 138 08/31/2018   NA 139 05/17/2017   K 4.0 08/31/2018   K 4.9 05/17/2017   CL 102 08/31/2018   CO2 24 08/31/2018   CO2 27 05/17/2017   BUN 13 08/31/2018   BUN 16.8 05/17/2017   CREATININE 0.95 08/31/2018   CREATININE 1.11 06/17/2018   CREATININE 1.1 05/17/2017   PROT 7.5 08/30/2018   PROT 7.0 05/17/2017   ALBUMIN 4.2 08/30/2018   ALBUMIN 4.1 05/17/2017   BILITOT 1.1 08/30/2018   BILITOT 0.6 06/17/2018   BILITOT 0.57 05/17/2017   ALKPHOS 74 08/30/2018   ALKPHOS 44 05/17/2017   AST 24 08/30/2018   AST 31 06/17/2018   AST 17 05/17/2017   ALT 21 08/30/2018   ALT 20 06/17/2018   ALT 16 05/17/2017  .   Total Time in preparing paper work, data evaluation and todays exam - 35 minutes  Norval Morton M.D on 09/03/2018 at 11:21 AM  [facility]   Office  [phone]

## 2018-09-03 NOTE — Progress Notes (Signed)
Nicholas Lowery   DOB:[birth year]   HY#:[id number]   EXB#:[id number]  Oncology follow up   Subjective: Patient reports that pain is overall controlled.  He continues on the fentanyl patch and oxycodone for breakthrough pain.  He still has not had a bowel movement since prior to admission.  It has been almost a week.  Reports flatus.  He is receiving laxatives and may require an enema later today.  He is hoping to go home later today if his bowels moved.  No other complaints.  Objective:  Vitals:   09/03/18 0519 09/03/18 0906  BP: (!) 159/114 (!) 156/108  Pulse: 96 87  Resp: 19 20  Temp: 97.6 F (36.4 C) 98 F (36.7 C)  SpO2: 98% 97%    Body mass index is 27.43 kg/m.  Intake/Output Summary (Last 24 hours) at 09/03/2018 1004 Last data filed at 09/03/2018 0900 Gross per 24 hour  Intake 480 ml  Output 0 ml  Net 480 ml     Sclerae unicteric  Oropharynx clear  No peripheral adenopathy  Lungs clear -- no rales or rhonchi  Heart regular rate and rhythm  Abdomen positive bowel sounds.  Soft, diffuse tenderness   MSK no focal spinal tenderness, no peripheral edema  Neuro nonfocal   CBG (last 3)  No results for input(s): GLUCAP in the last 72 hours.   Labs:  Urine Studies No results for input(s): UHGB, CRYS in the last 72 hours.  Invalid input(s): UACOL, UAPR, USPG, UPH, UTP, UGL, UKET, UBIL, UNIT, UROB, ULEU, UEPI, UWBC, URBC, UBAC, CAST, [place], [place]  Basic Metabolic Panel: Recent Labs  Lab 08/29/18 0929 08/30/18 1352 08/31/18 0532  NA 138 139 138  K 4.0 4.0 4.0  CL 100 101 102  CO2 24 25 24   GLUCOSE 101* 114* 93  BUN 8 12 13   CREATININE 1.05 1.39* 0.95  CALCIUM 10.1 9.9 9.3   GFR Estimated Creatinine Clearance: 90.7 mL/min (by C-G formula based on SCr of 0.95 mg/dL). Liver Function Tests: Recent Labs  Lab 08/29/18 0929 08/30/18 1352  AST 38 24  ALT 21 21  ALKPHOS 91 74  BILITOT 1.5* 1.1  PROT 8.1 7.5  ALBUMIN 4.8 4.2   Recent Labs  Lab 08/29/18 0929  08/30/18 1352  LIPASE 24 26   No results for input(s): AMMONIA in the last 168 hours. Coagulation profile No results for input(s): INR, PROTIME in the last 168 hours.  CBC: Recent Labs  Lab 08/29/18 0929 08/30/18 1352 08/31/18 0532  WBC 6.8 8.1 4.6  NEUTROABS 4.9 5.8  --   HGB 15.9 14.1 12.7*  HCT 47.3 42.6 38.9*  MCV 98.3 99.5 99.0  PLT 249 220 177   Cardiac Enzymes: No results for input(s): CKTOTAL, CKMB, CKMBINDEX, TROPONINI in the last 168 hours. BNP: Invalid input(s): POCBNP CBG: No results for input(s): GLUCAP in the last 168 hours. D-Dimer No results for input(s): DDIMER in the last 72 hours. Hgb A1c No results for input(s): HGBA1C in the last 72 hours. Lipid Profile No results for input(s): CHOL, HDL, LDLCALC, TRIG, CHOLHDL, LDLDIRECT in the last 72 hours. Thyroid function studies No results for input(s): TSH, T4TOTAL, T3FREE, THYROIDAB in the last 72 hours.  Invalid input(s): FREET3 Anemia work up No results for input(s): VITAMINB12, FOLATE, FERRITIN, TIBC, IRON, RETICCTPCT in the last 72 hours. Microbiology No results found for this or any previous visit (from the past 240 hour(s)).    Studies:  No results found.  Assessment: 58 y.o. with past medical  history of metastatic colon cancer, recently progressed clinical trial, presented with intractable abdominal pain, and worsening anxiety and depression.  1.  Intractable abdominal pain secondary to peritoneal metastasis 2.  Metastatic colon cancer 3.  Worsening depression and anxiety due to recent cancer progression and pain 4. Constipation  5. HTN   Plan:  -Appreciate the excellent care from the hospitalist and palliative care medicine  -I agree with current pain management regimen, he is on fentanyl 12.1mcg/hr and oxycodone as needed, adjustment per palliative care team.  -Low-dose gabapentin was added.  Patient may benefit from switching to Cymbalta.  This is previously been discussed with the  palliative care team and our pharmacist.  We will need to watch closely for serotonin syndrome since he is also on Effexor. -CT of the chest was obtained as a new baseline prior to beginning his next chemotherapy regimen.  Unfortunately, it does show disease progression.  This is not surprising given his previous progression of disease noted on the CT of the abdomen and pelvis.  Again, the plan is to change chemotherapy regimen later this week as an outpatient. -I again discussed that we are going to start him on next  line chemotherapy with nivolumab and regorafenib later this week.  -I strongly encouraged him to follow-up with his psychiatrist more frequently, due to significant worsening anxiety and depression, he agrees  -if he is able to be discharged home today, we will see him back at the cancer center on 4/22 as scheduled   Mikey Bussing, NP 09/03/2018  10:04 AM

## 2018-09-03 NOTE — Progress Notes (Signed)
DISCHARGE NOTE Nicholas Lowery to be discharged Home per MD order. Patient verbalized understanding.  Skin clean, dry and intact without evidence of skin break down, no evidence of skin tears noted. IV catheter discontinued intact. Site without signs and symptoms of complications. Dressing and pressure applied. Pt denies pain at the site currently. No complaints noted.  Patient free of lines, drains, and wounds.   Discharge packet assembled. An After Visit Summary (AVS) was printed and given to the patient.  Patient escorted via wheelchair and discharged to wife . Patient discharge instruction give, all questions and concerns addressed.   Dolores Hoose, RN

## 2018-09-03 NOTE — Progress Notes (Signed)
Palliative:  I met today at [name] bedside. He is inquiring about increase in fentanyl patch. We had a long discussion (with wife [name] over speaker phone as well) about his pain management plan. I explained that I did not feel comfortable increasing his fentanyl patch at current time as only option is to double the current dose. He is also functional and pain is managed with current patch dose and prn medications. OxyIR is working better for him than dilaudid po. I am also hoping that once he is able to have a BM that the pain will decrease. We did discuss that the patch can always be increased in the future and that he should continue current plan.   We did discuss that he will need prn medication and that increasing the fentanyl patch will not change this fact. We also discussed his fear of anticipating pain return. At the end of our conversation he agrees that he needs to wait for further increases in pain management. I encouraged him to be more aware to take his OxyIR when pain begins and not waiting. I also encouraged to keep a log of when and how often he is taking and efficacy.   He has an appointment with [name] tomorrow. Hopeful for BM and home today. Will connect with outpatient palliative with Wadie Lessen, NP at [facility] clinic. All questions/concerns addressed. Emotional support provided.   Exam: Alert, oriented. No distress. Functional and able to perform ADLs independently. Abd soft, nondistended. + flatus.   Plan: - Abd pain: - Fentanyl duragesic 12.5 mcg/hr.             - OxyIR 15 mg po q3h prn.  - Gabapentin 200 mg po qhs. Could consider Cymbalta but I will leave this up to be managed by his psychiatrist considering his high dose of Effexor already.  - Constipation: LBM 08/28/18. + flatus.  - Senokot-S 2 tablets BID.  - Miralax daily.  - Magnesium citrate 0.5 bottle 09/03/18.   -  -  Insomnia: - Hopefully improve with pain control.  - D/C trazodone as no relief.  - May use Ativan for sleep too.  - Anxiety: - Ativan 1 mg q4h prn.  - Depression:  - Effexor XR 150 mg po daily.   - Strongly encouraged to f/u with psychiatrist for his anxiety. I do believe some of his pain could be manifesting from his mental distress.   25 min  Vinie Sill, NP Palliative Medicine Team Pager # [phone] (M-F 8a-5p) Team Phone # [phone] (Nights/Weekends)

## 2018-09-03 NOTE — TOC Transition Note (Addendum)
Transition of Care [facility]) - CM/SW Discharge Note   Patient Details  Name: Nicholas Lowery MRN: [id number] Date of Birth: [birth year]  [facility] CM/SW Contact:  Bartholomew Crews, RN Phone Number: 09/03/2018, 12:04 PM   Clinical Narrative:    Patient to transition home today. Left voice mail with [facility] to advise of transition home. CM contact information provided.   Received call back from Holley at [facility]. Information provided for oncologist and PCP. Discussed potential for [facility] to follow up.    Received call from Harmon Pier at [facility]. Patient and spouse declined [facility], however, agreed to to hospital palliative working with oncologist for pain management.   Final next level of care: Home/Self Care Barriers to Discharge: No Barriers Identified   Patient Goals and CMS Choice   CMS Medicare.gov Compare Post Acute Care list provided to:: Patient Choice offered to / list presented to : Patient  Discharge Placement    Home                   Discharge Plan and Services    [facility] - [facility]iative            DME Arranged: N/A DME Agency: NA HH Arranged: (palliative care) [place] Agency: ([facility])   Social Determinants of Health (SDOH) Interventions     Readmission Risk Interventions No flowsheet data found.

## 2018-09-04 ENCOUNTER — Inpatient Hospital Stay: Payer: BLUE CROSS/BLUE SHIELD

## 2018-09-04 ENCOUNTER — Other Ambulatory Visit: Payer: Self-pay

## 2018-09-04 ENCOUNTER — Telehealth: Payer: Self-pay

## 2018-09-04 ENCOUNTER — Inpatient Hospital Stay (HOSPITAL_BASED_OUTPATIENT_CLINIC_OR_DEPARTMENT_OTHER): Payer: BLUE CROSS/BLUE SHIELD | Admitting: Hematology

## 2018-09-04 ENCOUNTER — Encounter: Payer: Self-pay | Admitting: Hematology

## 2018-09-04 VITALS — BP 127/104 | HR 101 | Temp 97.6°F | Resp 20 | Ht 70.0 in | Wt 188.6 lb

## 2018-09-04 VITALS — BP 139/105 | HR 99

## 2018-09-04 DIAGNOSIS — C7801 Secondary malignant neoplasm of right lung: Secondary | ICD-10-CM

## 2018-09-04 DIAGNOSIS — G4700 Insomnia, unspecified: Secondary | ICD-10-CM

## 2018-09-04 DIAGNOSIS — C787 Secondary malignant neoplasm of liver and intrahepatic bile duct: Secondary | ICD-10-CM | POA: Diagnosis not present

## 2018-09-04 DIAGNOSIS — F419 Anxiety disorder, unspecified: Secondary | ICD-10-CM | POA: Diagnosis not present

## 2018-09-04 DIAGNOSIS — Z5112 Encounter for antineoplastic immunotherapy: Secondary | ICD-10-CM | POA: Diagnosis not present

## 2018-09-04 DIAGNOSIS — I10 Essential (primary) hypertension: Secondary | ICD-10-CM

## 2018-09-04 DIAGNOSIS — F329 Major depressive disorder, single episode, unspecified: Secondary | ICD-10-CM

## 2018-09-04 DIAGNOSIS — Z79899 Other long term (current) drug therapy: Secondary | ICD-10-CM | POA: Diagnosis not present

## 2018-09-04 DIAGNOSIS — G629 Polyneuropathy, unspecified: Secondary | ICD-10-CM | POA: Diagnosis not present

## 2018-09-04 DIAGNOSIS — R1312 Dysphagia, oropharyngeal phase: Secondary | ICD-10-CM

## 2018-09-04 DIAGNOSIS — C182 Malignant neoplasm of ascending colon: Secondary | ICD-10-CM | POA: Diagnosis not present

## 2018-09-04 DIAGNOSIS — D509 Iron deficiency anemia, unspecified: Secondary | ICD-10-CM | POA: Diagnosis not present

## 2018-09-04 DIAGNOSIS — C189 Malignant neoplasm of colon, unspecified: Secondary | ICD-10-CM

## 2018-09-04 DIAGNOSIS — Z66 Do not resuscitate: Secondary | ICD-10-CM | POA: Diagnosis not present

## 2018-09-04 DIAGNOSIS — G893 Neoplasm related pain (acute) (chronic): Secondary | ICD-10-CM

## 2018-09-04 DIAGNOSIS — C7802 Secondary malignant neoplasm of left lung: Secondary | ICD-10-CM

## 2018-09-04 DIAGNOSIS — Z87891 Personal history of nicotine dependence: Secondary | ICD-10-CM | POA: Diagnosis not present

## 2018-09-04 LAB — CEA (IN HOUSE-CHCC): CEA (CHCC-In House): 20.4 ng/mL — ABNORMAL HIGH (ref 0.00–5.00)

## 2018-09-04 LAB — CMP (CANCER CENTER ONLY)
ALT: 98 U/L — ABNORMAL HIGH (ref 0–44)
AST: 57 U/L — ABNORMAL HIGH (ref 15–41)
Albumin: 4.4 g/dL (ref 3.5–5.0)
Alkaline Phosphatase: 101 U/L (ref 38–126)
Anion gap: 12 (ref 5–15)
BUN: 12 mg/dL (ref 6–20)
CO2: 27 mmol/L (ref 22–32)
Calcium: 10 mg/dL (ref 8.9–10.3)
Chloride: 99 mmol/L (ref 98–111)
Creatinine: 1.13 mg/dL (ref 0.61–1.24)
GFR, Est AFR Am: >60 mL/min (ref >60)
GFR, Estimated: >60 mL/min (ref >60)
Glucose, Bld: 93 mg/dL (ref 70–99)
Potassium: 4.2 mmol/L (ref 3.5–5.1)
Sodium: 138 mmol/L (ref 135–145)
Total Bilirubin: 1.2 mg/dL (ref 0.3–1.2)
Total Protein: 8.2 g/dL — ABNORMAL HIGH (ref 6.5–8.1)

## 2018-09-04 LAB — CBC WITH DIFFERENTIAL/PLATELET
Abs Immature Granulocytes: 0.01 10*3/uL (ref 0.00–0.07)
Basophils Absolute: 0.1 10*3/uL (ref 0.0–0.1)
Basophils Relative: 1 %
Eosinophils Absolute: 0.2 10*3/uL (ref 0.0–0.5)
Eosinophils Relative: 3 %
HCT: 46.2 % (ref 39.0–52.0)
Hemoglobin: 15 g/dL (ref 13.0–17.0)
Immature Granulocytes: 0 %
Lymphocytes Relative: 13 %
Lymphs Abs: 0.9 10*3/uL (ref 0.7–4.0)
MCH: 32.5 pg (ref 26.0–34.0)
MCHC: 32.5 g/dL (ref 30.0–36.0)
MCV: 100.2 fL — ABNORMAL HIGH (ref 80.0–100.0)
Monocytes Absolute: 0.8 10*3/uL (ref 0.1–1.0)
Monocytes Relative: 12 %
Neutro Abs: 4.9 10*3/uL (ref 1.7–7.7)
Neutrophils Relative %: 71 %
Platelets: 233 10*3/uL (ref 150–400)
RBC: 4.61 MIL/uL (ref 4.22–5.81)
RDW: 13.9 % (ref 11.5–15.5)
WBC: 6.8 10*3/uL (ref 4.0–10.5)
nRBC: 0 % (ref 0.0–0.2)

## 2018-09-04 LAB — TOTAL PROTEIN, URINE DIPSTICK: Protein, ur: NEGATIVE mg/dL

## 2018-09-04 MED ORDER — SODIUM CHLORIDE 0.9 % IV SOLN
3.1500 mg/kg | Freq: Once | INTRAVENOUS | Status: AC
  Administered 2018-09-04: 280 mg via INTRAVENOUS
  Filled 2018-09-04: qty 4

## 2018-09-04 MED ORDER — SODIUM CHLORIDE 0.9% FLUSH
10.0000 mL | INTRAVENOUS | Status: DC | PRN
  Administered 2018-09-04: 17:00:00 10 mL
  Filled 2018-09-04: qty 10

## 2018-09-04 MED ORDER — HYDROMORPHONE HCL 2 MG PO TABS: 2.0000 mg | ORAL_TABLET | ORAL | 0 refills | Status: DC | PRN

## 2018-09-04 MED ORDER — HEPARIN SOD (PORK) LOCK FLUSH 100 UNIT/ML IV SOLN
500.0000 [IU] | Freq: Once | INTRAVENOUS | Status: AC | PRN
  Administered 2018-09-04: 500 [IU]
  Filled 2018-09-04: qty 5

## 2018-09-04 MED ORDER — GABAPENTIN 300 MG PO CAPS: 300.0000 mg | ORAL_CAPSULE | Freq: Three times a day (TID) | ORAL | 1 refills | Status: DC

## 2018-09-04 MED ORDER — SODIUM CHLORIDE 0.9 % IV SOLN
Freq: Once | INTRAVENOUS | Status: AC
  Administered 2018-09-04: 16:00:00 via INTRAVENOUS
  Filled 2018-09-04: qty 250

## 2018-09-04 NOTE — Patient Instructions (Signed)
[facility] Discharge Instructions for Patients Receiving Chemotherapy  Today you received the following chemotherapy agents: Opdivo  To help prevent nausea and vomiting after your treatment, we encourage you to take your nausea medication as directed.   If you develop nausea and vomiting that is not controlled by your nausea medication, call the clinic.   BELOW ARE SYMPTOMS THAT SHOULD BE REPORTED IMMEDIATELY:  *FEVER GREATER THAN 100.5 F  *CHILLS WITH OR WITHOUT FEVER  NAUSEA AND VOMITING THAT IS NOT CONTROLLED WITH YOUR NAUSEA MEDICATION  *UNUSUAL SHORTNESS OF BREATH  *UNUSUAL BRUISING OR BLEEDING  TENDERNESS IN MOUTH AND THROAT WITH OR WITHOUT PRESENCE OF ULCERS  *URINARY PROBLEMS  *BOWEL PROBLEMS  UNUSUAL RASH Items with * indicate a potential emergency and should be followed up as soon as possible.  Feel free to call the clinic should you have any questions or concerns. The clinic phone number is [phone].  Please show the [facility] at check-in to the Emergency Department and triage nurse.  Nivolumab injection What is this medicine? NIVOLUMAB (nye VOL ue mab) is a monoclonal antibody. It is used to treat melanoma, lung cancer, kidney cancer, head and neck cancer, Hodgkin lymphoma, urothelial cancer, colon cancer, and liver cancer. This medicine may be used for other purposes; ask your health care provider or pharmacist if you have questions. COMMON BRAND NAME(S): Opdivo What should I tell my health care provider before I take this medicine? They need to know if you have any of these conditions: -diabetes -immune system problems -kidney disease -liver disease -lung disease -organ transplant -stomach or intestine problems -thyroid disease -an unusual or allergic reaction to nivolumab, other medicines, foods, dyes, or preservatives -pregnant or trying to get pregnant -breast-feeding How should I use this medicine? This medicine is for  infusion into a vein. It is given by a health care professional in a hospital or clinic setting. A special MedGuide will be given to you before each treatment. Be sure to read this information carefully each time. Talk to your pediatrician regarding the use of this medicine in children. While this drug may be prescribed for children as young as 12 years for selected conditions, precautions do apply. Overdosage: If you think you have taken too much of this medicine contact a poison control center or emergency room at once. NOTE: This medicine is only for you. Do not share this medicine with others. What if I miss a dose? It is important not to miss your dose. Call your doctor or health care professional if you are unable to keep an appointment. What may interact with this medicine? Interactions have not been studied. Give your health care provider a list of all the medicines, herbs, non-prescription drugs, or dietary supplements you use. Also tell them if you smoke, drink alcohol, or use illegal drugs. Some items may interact with your medicine. This list may not describe all possible interactions. Give your health care provider a list of all the medicines, herbs, non-prescription drugs, or dietary supplements you use. Also tell them if you smoke, drink alcohol, or use illegal drugs. Some items may interact with your medicine. What should I watch for while using this medicine? This drug may make you feel generally unwell. Continue your course of treatment even though you feel ill unless your doctor tells you to stop. You may need blood work done while you are taking this medicine. Do not become pregnant while taking this medicine or for 5 months after stopping  it. Women should inform their doctor if they wish to become pregnant or think they might be pregnant. There is a potential for serious side effects to an unborn child. Talk to your health care professional or pharmacist for more information. Do  not breast-feed an infant while taking this medicine or for 5 months after stopping it. What side effects may I notice from receiving this medicine? Side effects that you should report to your doctor or health care professional as soon as possible: -allergic reactions like skin rash, itching or hives, swelling of the face, lips, or tongue -breathing problems -blood in the urine -bloody or watery diarrhea or black, tarry stools -changes in emotions or moods -changes in vision -chest pain -cough -dizziness -feeling faint or lightheaded, falls -fever, chills -headache with fever, neck stiffness, confusion, loss of memory, sensitivity to light, hallucination, loss of contact with reality, or seizures -joint pain -mouth sores -redness, blistering, peeling or loosening of the skin, including inside the mouth -severe muscle pain or weakness -signs and symptoms of high blood sugar such as dizziness; dry mouth; dry skin; fruity breath; nausea; stomach pain; increased hunger or thirst; increased urination -signs and symptoms of kidney injury like trouble passing urine or change in the amount of urine -signs and symptoms of liver injury like dark yellow or brown urine; general ill feeling or flu-like symptoms; light-colored stools; loss of appetite; nausea; right upper belly pain; unusually weak or tired; yellowing of the eyes or skin -swelling of the ankles, feet, hands -trouble passing urine or change in the amount of urine -unusually weak or tired -weight gain or loss Side effects that usually do not require medical attention (report to your doctor or health care professional if they continue or are bothersome): -bone pain -constipation -decreased appetite -diarrhea -muscle pain -nausea, vomiting -tiredness This list may not describe all possible side effects. Call your doctor for medical advice about side effects. You may report side effects to FDA at 1-800-FDA-1088. Where should I keep  my medicine? This drug is given in a hospital or clinic and will not be stored at home. NOTE: This sheet is a summary. It may not cover all possible information. If you have questions about this medicine, talk to your doctor, pharmacist, or health care provider.  2019 Elsevier/Gold Standard (2017-09-19 12:55:04)

## 2018-09-04 NOTE — Telephone Encounter (Signed)
Palliative Medicine RN Note: PMT rec'd a call yesterday afternoon from Nicholas Lowery. He had been discharged and was at home having n/v, he suspected from oxycodone. Because PMT is an inpatient service and he has not seen the outpatient PMT provider at the [facility], our team recommended he call Dr Burr Medico, who he is due to see 4/22. He requested a call back on 4/22 from PMT.   On 4/22, I spoke with PMT NP Vinie Sill. She has spoken with Dr Burr Medico, who will address Nicholas Lowery's concerns at his visit today. Nicholas Lowery called the PMT office before I could update him and left a message requesting recommendations for pain regimen and that he had been admitted & discharged for a "Pain Study." I attempted to return his call to provide an update regarding Nicholas Lowery's call to Dr Burr Medico, but there was no answer. Left a VM with no patient identification requesting he talk to Dr Burr Medico at his appointment today & providing our contact information.   Nicholas Skiff Davie Sagona, RN, BSN, [facility] Palliative Medicine Team 09/04/2018 10:47 AM Office [phone]

## 2018-09-04 NOTE — Progress Notes (Signed)
Okay to treat with BP per [name]

## 2018-09-05 ENCOUNTER — Telehealth: Payer: Self-pay | Admitting: Hematology

## 2018-09-05 ENCOUNTER — Other Ambulatory Visit: Payer: Self-pay

## 2018-09-05 DIAGNOSIS — C787 Secondary malignant neoplasm of liver and intrahepatic bile duct: Principal | ICD-10-CM

## 2018-09-05 DIAGNOSIS — C189 Malignant neoplasm of colon, unspecified: Secondary | ICD-10-CM

## 2018-09-05 NOTE — Telephone Encounter (Signed)
Scheduled appt per 4/22 los. °

## 2018-09-05 NOTE — Progress Notes (Signed)
DO

## 2018-09-06 ENCOUNTER — Telehealth: Payer: Self-pay | Admitting: *Deleted

## 2018-09-06 NOTE — Telephone Encounter (Signed)
-----   Message from Scot Dock, RN sent at 09/04/2018  6:15 PM EDT ----- Regarding: [name] 1st time chemo f/u

## 2018-09-06 NOTE — Telephone Encounter (Signed)
Called Nicholas Lowery for chemotherapy F/U.  Patient is doing well.  Denies n/v.  Denies any new side effects or symptoms.  Bowel and bladder functioning well.  Eating and drinking well.  "Trying to stay hydrate; do not want to experience constipation.again."  Instructed to drink 64 oz minimum daily or at least the day before, of and after treatment.  New treatment may cause diarrhea.  We need to be notified if this occurs.  Denies questions or needs at this time.  Encouraged to call [phone] Mon -Fri 8:00 am - 4:30 pm or anytime as needed for symptoms, changes or event outside office hours.

## 2018-09-09 ENCOUNTER — Other Ambulatory Visit: Payer: Self-pay | Admitting: Nurse Practitioner

## 2018-09-09 ENCOUNTER — Encounter: Payer: Self-pay | Admitting: Hematology

## 2018-09-09 ENCOUNTER — Telehealth: Payer: Self-pay | Admitting: Nurse Practitioner

## 2018-09-09 DIAGNOSIS — C189 Malignant neoplasm of colon, unspecified: Secondary | ICD-10-CM

## 2018-09-09 DIAGNOSIS — C787 Secondary malignant neoplasm of liver and intrahepatic bile duct: Principal | ICD-10-CM

## 2018-09-09 MED ORDER — FENTANYL 25 MCG/HR TD PT72: 1.0000 | MEDICATED_PATCH | TRANSDERMAL | 0 refills | Status: DC

## 2018-09-09 NOTE — Telephone Encounter (Signed)
Called regarding upcoming [facility] appointment, patient is notified and e-mail has been sent.

## 2018-09-10 NOTE — Progress Notes (Signed)
[place]   [phone] [phone]   Telehealth Follow up Note   Patient Care Team: Curlene Labrum, MD as PCP - General (Family Medicine) 09/11/2018  I connected with Doylene Bode on 09/11/2018 at 9:25 EDT via WebEx and verified that I am speaking with the correct person using two identifiers. The patient was alone for today's encounter. I discussed the limitations, risks, security and privacy concerns of performing an evaluation and management service by telephone and the availability of in person appointments. I explained there may be a charge associated with today's encounter. The patient expressed understanding and agreed to proceed.   CHIEF COMPLAINT: f/u metastatic colon cancer   SUMMARY OF ONCOLOGIC HISTORY: Oncology History   Cancer Staging Metastatic colon cancer to liver [facility] Staging form: Colon and Rectum, AJCC 8th Edition - Clinical stage from 06/01/2016: Stage IVA (cTX, cNX, pM1a) - Signed by Truitt Merle, MD on 07/04/2016 - Pathologic stage from 06/14/2016: Stage IVA (pT4b(m), pN2b, pM1a) - Signed by Truitt Merle, MD on 07/04/2016       Metastatic colon cancer to liver ([place])   04/2015 Procedure    Colonoscopy by Dr. Ladona Horns. It showed showed 2 sessile polyps ready between 3-5 mm in size located 20 cm (A, B) from the point of entry, polypectomy was performed. Pedunculated polyp was found in the ascending colon (C), polypectomy was performed, and additional polyp (D) was found 30 cm from the point of entry, removed    04/2015 Pathology Results    tubular adenoma (A and B), and well differentiated adenocarcinoma arising from tubulovillous adenoma (C) and well differentiated adenocarcinoma arising from severe dysplasia to intramucosal carcinoma within tubular adenoma.     04/2015 Initial Diagnosis    Metastatic colon cancer to liver ([place])    05/29/2016 Imaging    CT abdomen and pelvis with contrast showed an apple core like stricture in  right colon just above the cecum, measuring 3.2 cm in lengths. This is highly suspicious for malignancy. Small lymph node a noticed he had adjacent mesentery, measuring 8 mm. There is a low-density lesion within the inferior aspect of the right hepatic lobe measuring 1.7 cm, suspicious for metastasis.     06/06/2016 Tumor Marker    CEA 9.99    06/08/2016 PET scan    IMPRESSION: Approximately 3 cm hypermetabolic mass in the ascending colon, consistent with primary colon carcinoma. This mass results in colonic obstruction and small bowel dilatation. Additional areas of hypermetabolic wall thickening in the cecum may represent other sites of colon carcinoma or colitis. Mild hypermetabolic lymphadenopathy in right pericolonic region, porta hepatis, and aortocaval space, consistent with metastatic disease. Mild hypermetabolic mediastinal lymphadenopathy also seen, and thoracic lymph node metastases cannot be excluded. Solitary hypermetabolic focus in inferior right hepatic lobe, consistent with liver metastasis. Consider abdomen MRI without and with contrast for further evaluation.    06/14/2016 Surgery    Hand assisted right hemicolectomy and small bowel resection for colon cancer, liver biopsy, by Dr. Barry Dienes    06/14/2016 Pathology Results    Right hemicolectomy showed invasive well to moderately differentiated adenocarcinoma, 2 foci measuring 7.5 cm and 4.5 cm, tumor invades through full thickness of colon, to the seroma and involve the Small Bowel, Surgical Margins Were Negative, 24 Out Of 64 Lymph Nodes Were Positive, Extracapsular Extension Identified, Multiple Satellite Tumor Deposits Present, Liver Biopsy Showed Metastatic Adenocarcinoma.      06/14/2016 Miscellaneous    Tumor MMR normal, MSI stable  06/14/2016 Miscellaneous    Foundation one genomic testing showed K-ras G12 D mutation, APC and TP53 mutation. No BRAF and NRAS mutation. MSI-stable, tumor burden low.    07/06/2016 Tumor  Marker    CEA 13.69    07/13/2016 - 01/18/2017 Chemotherapy    mFOLFOX, every 2 weeks, started on 07/14/2015, Avastin added from cycle 3  Oxaliplatin dose to '60mg'$ /m2 due to side effects and some cytopenia on 09/21/16  Changed to FOLFIRINOX starting cycle 7 and Reduced  Due to neuropathy hold Oxaliplatin and add Irinotecan with neulasta on day 3 starting with cycle 7 Add low dose Oxaliplatin with cycle 8.  Due to his worsening neuropathy, and good response to chemotherapy, I previously stopped oxaliplatin from cycle 11, and continue FOLFIRI and avastin       07/27/2016 Tumor Marker    CEA 15.85    09/04/2016 Imaging    Ct C/A/P W Contrast IMPRESSION: Interval right colectomy. Stable small liver metastasis in the inferior right hepatic lobe. Stable mild porta hepatis and aortocaval lymphadenopathy. Stable mild mediastinal lymphadenopathy. No new or progressive metastatic disease identified within the chest, abdomen, or pelvis.    12/19/2016 PET scan    IMPRESSION: 1. Right hemicolectomy, with resolution of the prior hypermetabolic activity inferiorly in the right hepatic lobe, in several mediastinal lymph nodes, and in lymph nodes in the retroperitoneum and porta hepatis. No residual hypermetabolic or enlarged lymph nodes are identified. 2. Low-grade diffuse skeletal metabolic activity is likely therapy related. 3. Coronary atherosclerosis. 4. 3 by 4 mm right middle lobe pulmonary nodule is stable, not appreciably hypermetabolic, but below sensitive PET-CT size thresholds. This may warrant surveillance.    01/11/2017 Imaging    MR Abdomen W WO Contrast IMPRESSION: 1. No acute findings within the abdomen. Previously noted liver metastasis has resolved in the interval. No new lesions.    02/01/2017 - 09/2017 Chemotherapy    Maintenance therapy, Xeloda '2000mg'$  (1000 mg/m2)  q12h on day 1-14 every 21 days plus AVASTIN, starting 02/01/2017.  stopped after 12 days due to poor tolerance on  02/14/17  Changed to maintenance 5-FU and avastin every 2 weeks starting on 02/22/17, stopped in 09/2017.       04/10/2017 Imaging    IMPRESSION: 1. Status post right hemicolectomy without findings for recurrent tumor. 2. No worrisome hepatic lesions. Treated disease with only a small residual low attenuation lesion in the right hepatic lobe. 3. No recurrent mediastinal or abdominal lymphadenopathy.     07/09/2017 PET scan    PET 07/09/17 IMPRESSION: 1. Status post right hemicolectomy, without findings of hypermetabolic recurrent or metastatic disease. 2. A right middle lobe pulmonary nodule is unchanged. However, there is a right lower lobe 5 mm pulmonary nodule which is felt to be new and enlarged compared to prior exams. Suspicious for an isolated pulmonary metastasis. Consider CT follow-up at 3-6 months. 3. Age advanced coronary artery atherosclerosis. Recommend assessment of coronary risk factors and consideration of medical therapy. 4. Borderline ascending aortic dilatation, 4.0 cm      10/10/2017 Imaging    IMPRESSION: 1. Several (at least 10) subcentimeter pulmonary nodules scattered in both lungs, predominantly in the lower lobes, all new/increased, most compatible with enlarging pulmonary metastases, largest 8 mm in the right lower lobe. 2. No additional findings of new or progressive metastatic disease. No recurrent adenopathy. Stable small low-attenuation lesion in the inferior right liver lobe compatible with treated metastasis. No new liver metastases. 3. Stable ectatic 4.0 cm ascending thoracic aorta. Recommend  annual imaging followup by CTA or MRA.  4. Stable mild splenomegaly.     10/11/2017 - 12/2017 Chemotherapy    FOLFIRI and Avastin every 2 weeks starting 10/11/17, irnotecan stopped in 12/2017 due to disease progression      01/08/2018 Imaging    01/08/2018 CT CAP IMPRESSION: 1. Progressive hepatic and pulmonary metastatic disease. 2. Ascending Aortic  aneurysm NOS (ICD10-I71.9).    01/08/2018 Progression    01/08/2018 CT CAP IMPRESSION: 1. Progressive hepatic and pulmonary metastatic disease. 2. Ascending Aortic aneurysm NOS (ICD10-I71.9).    01/10/2018 - 03/07/2018 Chemotherapy    FOLFOX and Avastin every 2 weeks starting on 01/10/2018. Stopped due to disease progression     03/19/2018 Progression    03/19/2018 CT CAP IMPRESSION: 1. Interval increase in size of dominant nodule within the right lower lobe. There are a few additional nodules which have increased in size. Multiple additional small bilateral pulmonary nodules are grossly similar. 2. Slight interval increase in size of lesion within the right hepatic lobe. 3. Interval increase in soft tissue at the surgical anastomosis involving the small bowel within the central abdomen. Locally recurrent disease not excluded.     04/07/2018 - 04/2018 Chemotherapy    Third lineXeloda '1500mg'$  BID 7 days on/7 days off and Avastin every 2 weeksstarting 04/07/18 stopped after 1 cycle and 4 days because he started Clinical trail.     05/22/2018 Imaging    CT CAP [facility] 05/22/18 [facility] FINDINGS:   LINES AND TUBES: None.  LOWER THORAX: Please see same day CT chest for findings above the diaphragm.Marland Kitchen  HEPATOBILIARY: Low-attenuation lesion in segment 5 has minimally increased in size from prior measuring 2.4 x 2.0 cm, previously 2.3 x 1.8 cm when remeasured (8:42). The gallbladder is present and otherwise unremarkable. No biliary dilatation.   SPLEEN: Unremarkable. PANCREAS: Unremarkable.  ADRENALS: Unremarkable. KIDNEYS/URETERS: Unremarkable.  BLADDER: Decompressed making further evaluation difficult. PELVIC/REPRODUCTIVE ORGANS: Unremarkable.  GI TRACT: No dilated or thick walled loops of bowel. Sequelae of right hemicolectomy and appendectomy.   PERITONEUM/RETROPERITONEUM AND MESENTERY: Minimally decreased spiculated mesenteric soft tissue density at the site of enteroenteric  anastomosis, now measuring roughly 2.6 x 1.4 cm, previously 3.3 x 3.1 cm (8:54). There is also a mild amount of stranding near the anastomosis within the mid abdomen (8:54)   LYMPH NODES: Borderline enlarged 1.0 cm porta hepatic lymph node, previously 0.7 cm (8:26). VESSELS: The aorta is normal in caliber.  No significant calcified atherosclerotic disease. Though the contrast timing is suboptimal for evaluation of the portal venous system, the portal venous system is appears patent. The hepatic veins and IVC are unremarkable.  BONES AND SOFT TISSUES: Small fat-containing ventral hernia..    05/24/2018 - 08/02/2018 Chemotherapy    [facility] [id number] with palbociclib Lucia Bitter) '100mg'$  3 weeks on/1 week off and binimetinib 15 mg BID starting 05/24/18. Stopped 08/02/18 due to disease progression.     08/02/2018 Imaging    CT CAP 08/02/18 at [facility] IMPRESSION:  Since 05/22/2018:  -Unchanged to mildly decreased in size segment 6 hepatic lesion.  -Unchanged irregular soft tissue density at the enteroenteric anastomosis.  -Increase in size of an irregular soft tissue density at the enterocolic anastomosis. This is new since 03/19/2018.  -Mild decrease in size of porta hepatis lymph node.  IMPRESSION:  Slight interval increase in multiple metastatic pulmonary nodules.  New likely malignant small right pleural effusion. Interval prominent right infrahilar lymph nodes; metastasis is in the differential. Mild interlobular septal thickening in the  right middle and lower lobes is concerning for lymphangitic tumor spread. Attention on follow-up is suggested.      08/29/2018 Imaging    CT AP 08/29/18 IMPRESSION: 1. No acute CT findings of the abdomen or pelvis to explain abdominal pain.  2. Evidence of worsening metastatic colon malignancy status post right colon resection, including increasing mesenteric nodularity, increasing peritoneal nodularity, enlarging liver metastasis, and new and  enlarging pulmonary nodules.  3. Trace perihepatic ascites, of uncertain etiology, although presumably malignant given peritoneal metastatic disease.    09/02/2018 Imaging    CT chest 09/02/18  IMPRESSION: 1. Unfortunately there is interval increase in size of bilateral pulmonary nodules consistent with progression of pulmonary metastasis. 2. New small moderate RIGHT pleural effusion. 3. Interval increase in size of small mediastinal lymph nodes most consistent with metastatic adenopathy. 4. Interval increase in size of hepatic metastasis in the inferior RIGHT hepatic lobe compared to CT 03/19/2018.    09/04/2018 -  Chemotherapy    oral Ragorafinib '80mg'$  daily 3 weeks on, 1 week off every 28 days as tolerated and IV Nivolumab '3mg'$ /kg q2weeks starting 09/04/18     CURRENT THERAPY: oral Ragorafinib'80mg'$ daily 3 weeks on, 1 week off and IV Nivolumab '3mg'$ /kgq2weeks starting 09/04/18  INTERVAL HISTORY: I met with Mr. Goerner via Webex for today's virtual toxicity check. He began regorafenib and had first dose nivolumab 09/04/18. He cannot tell if he is having new side effects. Appetite is low, he is not interested in food. He is drinking adequately. Has started walking 2.5 miles with his wife periodically. He has intermittent cough with yellow phlegm. He has "lung" pain with deep inspiration and occasionally wheezes at night. Denies fever. Denies chest pain or dyspnea. Denies leg edema. He fluctuates between constipation and diarrhea. Has a lose BM every other day. He uses miralax and Senakot when he takes pain medication. Abdominal pain during the visit is 3/10 in the right upper abdomen. He is using 2 fentanyl patches to equal 24 mcg per MD recommendation, he will switch to one 25 mcg patch when he changes tomorrow. Takes 2-3 tabs dilaudid at least every other day mostly at night. Does occasionally take 4 tabs with increased pain at night. He does not take oxycodone. Denies n/v. He has cut back on  alcohol. Has not seen Dr. Cheryln Manly for mental health in over 2 months. He is depressed and stressed financially, worried about his families security in the future. Denies SI. He has virtual f/u with Dr. Truman Hayward on Friday.   REVIEW OF SYSTEMS:   Constitutional: Denies fevers, chills or abnormal weight loss (+) decreased appetite  Eyes: Denies blurriness of vision Ears, nose, mouth, throat, and face: Denies mucositis or sore throat Respiratory: Denies dyspnea (+) intermittent cough with phlegm (+) occasional wheezing (+) inspiratory pain  Cardiovascular: Denies palpitation, chest discomfort or lower extremity swelling Gastrointestinal:  Denies nausea, vomiting, GI bleeding, heartburn or change in bowel habits (+) fluctuates with constipation and diarrhea (+) abdominal pain  Skin: Denies abnormal skin rashes Lymphatics: Denies new lymphadenopathy or easy bruising Neurological:Denies new weaknesses (+) neuropathy improving in feet Behavioral/Psych: (+) depression (+) tearful at times  All other systems were reviewed with the patient and are negative.  MEDICAL HISTORY:  Past Medical History:  Diagnosis Date   Anxiety    Cancer of ascending colon ([place])    Hypertension    Seasonal allergies     SURGICAL HISTORY: Past Surgical History:  Procedure Laterality Date   APPENDECTOMY  1992  CATARACT EXTRACTION W/ INTRAOCULAR LENS IMPLANT Left 04/2016   COLON SURGERY     COLONOSCOPY W/ BIOPSIES AND POLYPECTOMY  04/2015   INGUINAL HERNIA REPAIR Right 1982   IR RADIOLOGIST EVAL & MGMT  01/16/2017   KNEE ARTHROSCOPY W/ MENISCECTOMY Right 1998   LAPAROSCOPIC RIGHT COLECTOMY N/A 06/14/2016   Procedure: LAPAROSCOPIC HAND ASSISTED HEMICOLECTOMY AND SMALL BOWEL RESECTION.;  Surgeon: [name], MD;  Location: [place];  Service: General;  Laterality: N/A;   LIVER BIOPSY Right 06/14/2016   Procedure: LIVER BIOPSY;  Surgeon: [name], MD;  Location: [place];  Service: General;  Laterality: Right;   Right Inferior Liver   PORTACATH PLACEMENT N/A 07/06/2016   Procedure: INSERTION PORT-A-CATH;  Surgeon: [name], MD;  Location: [place];  Service: General;  Laterality: N/A;   VASECTOMY  2005    I have reviewed the social history and family history with the patient and they are unchanged from previous note.  ALLERGIES:  is allergic to penicillins and xeloda [capecitabine].  MEDICATIONS:  Current Outpatient Medications  Medication Sig Dispense Refill   fentaNYL (DURAGESIC) 25 MCG/HR Place 1 patch onto the skin every 3 (three) days. 5 patch 0   gabapentin (NEURONTIN) 300 MG capsule Take 1 capsule (300 mg total) by mouth 3 (three) times daily. 90 capsule 1   HYDROmorphone (DILAUDID) 2 MG tablet Take 1-2 tablets (2-4 mg total) by mouth every 3 (three) hours as needed for severe pain. 120 tablet 0   losartan (COZAAR) 100 MG tablet Take 1 tablet (100 mg total) by mouth daily. 30 tablet 0   polyethylene glycol (MIRALAX / GLYCOLAX) 17 g packet Take 17 g by mouth daily. 14 each 0   regorafenib (STIVARGA) 40 MG tablet Take 2 tablets (80 mg total) by mouth daily. Take for 3 weeks on, 1 week off, repeat every 4 weeks. Take with a low fat and low calorie meal 42 tablet 2   senna-docusate (SENOKOT-S) 8.6-50 MG tablet Take 2 tablets by mouth 2 (two) times daily.     venlafaxine XR (EFFEXOR-XR) 150 MG 24 hr capsule Take 1 capsule (150 mg total) by mouth daily with breakfast.     albuterol (VENTOLIN HFA) 108 (90 Base) MCG/ACT inhaler Inhale 1-2 puffs into the lungs every 6 (six) hours as needed for wheezing or shortness of breath. 1 Inhaler 2   amLODipine (NORVASC) 10 MG tablet Take 1 tablet (10 mg total) by mouth daily. 30 tablet 5   LORazepam (ATIVAN) 1 MG tablet Take 1 tablet (1 mg total) by mouth every 4 (four) hours as needed for anxiety or sleep (nausea). 30 tablet 0   Multiple Vitamin (MULTIVITAMIN WITH MINERALS) TABS tablet Take 1 tablet by mouth daily.      omeprazole (PRILOSEC) 20 MG capsule TAKE ONE CAPSULE BY MOUTH TWICE DAILY BEFORE A MEAL (Patient taking differently: Take 20 mg by mouth daily. ) 60 capsule 2   oxyCODONE (ROXICODONE) 15 MG immediate release tablet Take 1 tablet (15 mg total) by mouth every 3 (three) hours as needed for moderate pain. 30 tablet 0   prochlorperazine (COMPAZINE) 10 MG tablet Take 1 tablet (10 mg total) by mouth every 6 (six) hours as needed for nausea or vomiting. 30 tablet 3   sildenafil (REVATIO) 20 MG tablet Take 60-100 mg by mouth See admin instructions. Take 60-100 mg by mouth at least 30 minutes prior to sexual activity     simethicone (MYLICON) 80 MG chewable tablet Chew 1 tablet (80  mg total) by mouth 4 (four) times daily as needed for flatulence. 30 tablet 0   No current facility-administered medications for this visit.    OBJECTIVE:  The patient appears well. No obvious rash on face or neck. Respiratory effort is normal, he did not cough. Speech is clear and non-pressured. Neuro grossly normal.   LABORATORY DATA:  I have reviewed the data as listed CBC Latest Ref Rng & Units 09/04/2018 08/31/2018 08/30/2018  WBC 4.0 - 10.5 K/uL 6.8 4.6 8.1  Hemoglobin 13.0 - 17.0 g/dL 15.0 12.7(L) 14.1  Hematocrit 39.0 - 52.0 % 46.2 38.9(L) 42.6  Platelets 150 - 400 K/uL 233 177 220     CMP Latest Ref Rng & Units 09/04/2018 08/31/2018 08/30/2018  Glucose 70 - 99 mg/dL 93 93 114(H)  BUN 6 - 20 mg/dL '12 13 12  '$ Creatinine 0.61 - 1.24 mg/dL 1.13 0.95 1.39(H)  Sodium 135 - 145 mmol/L 138 138 139  Potassium 3.5 - 5.1 mmol/L 4.2 4.0 4.0  Chloride 98 - 111 mmol/L 99 102 101  CO2 22 - 32 mmol/L '27 24 25  '$ Calcium 8.9 - 10.3 mg/dL 10.0 9.3 9.9  Total Protein 6.5 - 8.1 g/dL 8.2(H) - 7.5  Total Bilirubin 0.3 - 1.2 mg/dL 1.2 - 1.1  Alkaline Phos 38 - 126 U/L 101 - 74  AST 15 - 41 U/L 57(H) - 24  ALT 0 - 44 U/L 98(H) - 21      RADIOGRAPHIC STUDIES: I have personally reviewed the radiological images as listed and agreed  with the findings in the report. No results found.   ASSESSMENT & PLAN: Nicholas Lowery is a 58 y.o. male with   1. Right colon cancer with liver, node and lung metastasis, pT4bN2bM1, stage IV, MSI-stable, KRAS G12D mutation (+) -Treatment summary: -diagnosed in 04/2015. He is s/p small bowel resection, adjuvant mFOLFOX which was switched to FOLFIRINOX and then dose reduced to FOLFIRI. -Unfortunately he progressed on maintenance 5-FU, FOLFIRI and FOLFOX.  -Started3rd line Xeloda andAvastinin 03/2018 but after 1 cycleand 3 days treated [id number]'100mg'$  3 weeks on/1 week off andbinimetinib 15 mgBID starting 1/10/20withDr. Truman Hayward -[facility] and was [facility] from [facility] care. -CT Chest/AP from 08/2018 showed progressive metastatic cancer in colon, peritoneal, mesentery and new right sided pleural effusion and new pulmonary nodules.  -he began next line therapy with regorafenib and Nivo on 4/23 per [name] based on the [place] clinical trialdata(JCO 37(15), 2019). he understands the goal of therapy is palliative  -he appears stable; has tolerated therapy moderately well thus far.  -He has mild intermittent diarrhea every other day; I recommend he hold senakot and miralax on days with lose stool. I urged him to remain adequately hydrated and eat regularly. He agrees -for mild intermittent cough and wheezing at night, I recommend albuterol inhaler PRN. Afebrile; this does not sound infectious. He does not smoke. I encouraged him to call back or seek in-person visit if symptoms worsen or fail to improve with treatment. -he will return for lab, f/u, and nivolumab infusion in 1 week; he will continue regorafenib daily to total 3 weeks on and 1 week off. I reviewed the plan with [name].   2. Upper abdominal pain  -recent hospital admission for pain control in 08/2018, started fentanyl  patch and oral dilaudid  -on 25 mcg fentanyl patch and 4 mg dilaudid PRN; he does not use oxycodone. I recommend he use dilaudid as prescribed; he  has taken max dose 8 mg for severe pain at night; currently taking every other day, mostly at night  -regimen is adequate for now. I explained we will try to reduce narcotics as his pain improves. He agrees.  -pain 3/10 today  3. Depression, Anxiety, Insomnia  -long history of this, on medication  -worse lately with cancer progression. He is very concerned for his family and their financial security in the future. He is grieving the milestones he will miss out on  -has not started ativan for sleep as of 09/11/18 -he uses alcohol to cope, but has cut back recently. I applauded him and urged him to continue to cut back -followed by Dr. Cheryln Manly but has not seen him lately. I urged him to reach out; he will consider.  -denies SI  -monitoring closely   4. Hypertension  -OnAmlodipine, lisinopril recently changed to losartan -elevated in clinic; monitoring closely   5. Anemia of iron deficiency and chemo  -Hgb normal on 09/04/18   6. Anorexia and weight loss  -Secondary to underlying malignancy -encouraged him to eat small, frequent meals daily   7. Goal of care discussion, on DNR/DNI  -treatment goal is palliative, he understands his cancer is incurable    Plan -lab, f/u, nivo in 1 week -albuterol inh 1-2 puffs q6 PRN for wheezing  -continue regorafenib for 2 more weeks (total 3 weeks on), then 1 week off -consider [facility] telehealth visit   The patient was provided an opportunity to ask questions and all were answered. The patient agreed with the plan and demonstrated an understanding of the instructions. The patient was advised to call back or seek an in-person evaluation if the symptoms worsen or if the condition fails to improve as anticipated.   I provided 25 minutes of non-face-to-face time during today's encounter.     Alla Feeling, NP 09/11/18

## 2018-09-11 ENCOUNTER — Inpatient Hospital Stay (HOSPITAL_BASED_OUTPATIENT_CLINIC_OR_DEPARTMENT_OTHER): Payer: BLUE CROSS/BLUE SHIELD | Admitting: Nurse Practitioner

## 2018-09-11 ENCOUNTER — Telehealth: Payer: Self-pay | Admitting: Nurse Practitioner

## 2018-09-11 DIAGNOSIS — Z9221 Personal history of antineoplastic chemotherapy: Secondary | ICD-10-CM

## 2018-09-11 DIAGNOSIS — C787 Secondary malignant neoplasm of liver and intrahepatic bile duct: Secondary | ICD-10-CM

## 2018-09-11 DIAGNOSIS — C182 Malignant neoplasm of ascending colon: Secondary | ICD-10-CM | POA: Diagnosis not present

## 2018-09-11 DIAGNOSIS — C7800 Secondary malignant neoplasm of unspecified lung: Secondary | ICD-10-CM | POA: Diagnosis not present

## 2018-09-11 DIAGNOSIS — C189 Malignant neoplasm of colon, unspecified: Secondary | ICD-10-CM

## 2018-09-11 DIAGNOSIS — I10 Essential (primary) hypertension: Secondary | ICD-10-CM

## 2018-09-11 DIAGNOSIS — F418 Other specified anxiety disorders: Secondary | ICD-10-CM

## 2018-09-11 DIAGNOSIS — Z79899 Other long term (current) drug therapy: Secondary | ICD-10-CM

## 2018-09-11 DIAGNOSIS — R630 Anorexia: Secondary | ICD-10-CM

## 2018-09-11 MED ORDER — ALBUTEROL SULFATE HFA 108 (90 BASE) MCG/ACT IN AERS: 1.0000 | INHALATION_SPRAY | Freq: Four times a day (QID) | RESPIRATORY_TRACT | 2 refills | Status: AC | PRN

## 2018-09-11 NOTE — Telephone Encounter (Signed)
No los per 4/29. °

## 2018-09-16 NOTE — Progress Notes (Signed)
[place]   Telephone:([phone]) [phone] Fax:(336) [phone]   Clinic Follow up Note   Patient Care Team: Curlene Labrum, MD as PCP - General (Family Medicine)  Date of Service:  09/18/2018  CHIEF COMPLAINT:  F/u of metastatic colon cancer   SUMMARY OF ONCOLOGIC HISTORY: Oncology History   Cancer Staging Metastatic colon cancer to liver [facility]) Staging form: Colon and Rectum, AJCC 8th Edition - Clinical stage from 06/01/2016: Stage IVA (cTX, cNX, pM1a) - Signed by Truitt Merle, MD on 07/04/2016 - Pathologic stage from 06/14/2016: Stage IVA (pT4b(m), pN2b, pM1a) - Signed by Truitt Merle, MD on 07/04/2016       Metastatic colon cancer to liver ([place])   04/2015 Procedure    Colonoscopy by Dr. Ladona Horns. It showed showed 2 sessile polyps ready between 3-5 mm in size located 20 cm (A, B) from the point of entry, polypectomy was performed. Pedunculated polyp was found in the ascending colon (C), polypectomy was performed, and additional polyp (D) was found 30 cm from the point of entry, removed    04/2015 Pathology Results    tubular adenoma (A and B), and well differentiated adenocarcinoma arising from tubulovillous adenoma (C) and well differentiated adenocarcinoma arising from severe dysplasia to intramucosal carcinoma within tubular adenoma.     04/2015 Initial Diagnosis    Metastatic colon cancer to liver ([place])    05/29/2016 Imaging    CT abdomen and pelvis with contrast showed an apple core like stricture in right colon just above the cecum, measuring 3.2 cm in lengths. This is highly suspicious for malignancy. Small lymph node a noticed he had adjacent mesentery, measuring 8 mm. There is a low-density lesion within the inferior aspect of the right hepatic lobe measuring 1.7 cm, suspicious for metastasis.     06/06/2016 Tumor Marker    CEA 9.99    06/08/2016 PET scan    IMPRESSION: Approximately 3 cm hypermetabolic mass in the ascending colon, consistent with primary colon  carcinoma. This mass results in colonic obstruction and small bowel dilatation. Additional areas of hypermetabolic wall thickening in the cecum may represent other sites of colon carcinoma or colitis. Mild hypermetabolic lymphadenopathy in right pericolonic region, porta hepatis, and aortocaval space, consistent with metastatic disease. Mild hypermetabolic mediastinal lymphadenopathy also seen, and thoracic lymph node metastases cannot be excluded. Solitary hypermetabolic focus in inferior right hepatic lobe, consistent with liver metastasis. Consider abdomen MRI without and with contrast for further evaluation.    06/14/2016 Surgery    Hand assisted right hemicolectomy and small bowel resection for colon cancer, liver biopsy, by Dr. Barry Dienes    06/14/2016 Pathology Results    Right hemicolectomy showed invasive well to moderately differentiated adenocarcinoma, 2 foci measuring 7.5 cm and 4.5 cm, tumor invades through full thickness of colon, to the seroma and involve the Small Bowel, Surgical Margins Were Negative, 24 Out Of 64 Lymph Nodes Were Positive, Extracapsular Extension Identified, Multiple Satellite Tumor Deposits Present, Liver Biopsy Showed Metastatic Adenocarcinoma.      06/14/2016 Miscellaneous    Tumor MMR normal, MSI stable     06/14/2016 Miscellaneous    Foundation one genomic testing showed K-ras G12 D mutation, APC and TP53 mutation. No BRAF and NRAS mutation. MSI-stable, tumor burden low.    07/06/2016 Tumor Marker    CEA 13.69    07/13/2016 - 01/18/2017 Chemotherapy    mFOLFOX, every 2 weeks, started on 07/14/2015, Avastin added from cycle 3  Oxaliplatin dose to 89m/m2 due to side effects  and some cytopenia on 09/21/16  Changed to FOLFIRINOX starting cycle 7 and Reduced  Due to neuropathy hold Oxaliplatin and add Irinotecan with neulasta on day 3 starting with cycle 7 Add low dose Oxaliplatin with cycle 8.  Due to his worsening neuropathy, and good response to chemotherapy, I  previously stopped oxaliplatin from cycle 11, and continue FOLFIRI and avastin       07/27/2016 Tumor Marker    CEA 15.85    09/04/2016 Imaging    Ct C/A/P W Contrast IMPRESSION: Interval right colectomy. Stable small liver metastasis in the inferior right hepatic lobe. Stable mild porta hepatis and aortocaval lymphadenopathy. Stable mild mediastinal lymphadenopathy. No new or progressive metastatic disease identified within the chest, abdomen, or pelvis.    12/19/2016 PET scan    IMPRESSION: 1. Right hemicolectomy, with resolution of the prior hypermetabolic activity inferiorly in the right hepatic lobe, in several mediastinal lymph nodes, and in lymph nodes in the retroperitoneum and porta hepatis. No residual hypermetabolic or enlarged lymph nodes are identified. 2. Low-grade diffuse skeletal metabolic activity is likely therapy related. 3. Coronary atherosclerosis. 4. 3 by 4 mm right middle lobe pulmonary nodule is stable, not appreciably hypermetabolic, but below sensitive PET-CT size thresholds. This may warrant surveillance.    01/11/2017 Imaging    MR Abdomen W WO Contrast IMPRESSION: 1. No acute findings within the abdomen. Previously noted liver metastasis has resolved in the interval. No new lesions.    02/01/2017 - 09/2017 Chemotherapy    Maintenance therapy, Xeloda 2037m (1000 mg/m2)  q12h on day 1-14 every 21 days plus AVASTIN, starting 02/01/2017.  stopped after 12 days due to poor tolerance on 02/14/17  Changed to maintenance 5-FU and avastin every 2 weeks starting on 02/22/17, stopped in 09/2017.       04/10/2017 Imaging    IMPRESSION: 1. Status post right hemicolectomy without findings for recurrent tumor. 2. No worrisome hepatic lesions. Treated disease with only a small residual low attenuation lesion in the right hepatic lobe. 3. No recurrent mediastinal or abdominal lymphadenopathy.     07/09/2017 PET scan    PET 07/09/17 IMPRESSION: 1. Status post right  hemicolectomy, without findings of hypermetabolic recurrent or metastatic disease. 2. A right middle lobe pulmonary nodule is unchanged. However, there is a right lower lobe 5 mm pulmonary nodule which is felt to be new and enlarged compared to prior exams. Suspicious for an isolated pulmonary metastasis. Consider CT follow-up at 3-6 months. 3. Age advanced coronary artery atherosclerosis. Recommend assessment of coronary risk factors and consideration of medical therapy. 4. Borderline ascending aortic dilatation, 4.0 cm      10/10/2017 Imaging    IMPRESSION: 1. Several (at least 10) subcentimeter pulmonary nodules scattered in both lungs, predominantly in the lower lobes, all new/increased, most compatible with enlarging pulmonary metastases, largest 8 mm in the right lower lobe. 2. No additional findings of new or progressive metastatic disease. No recurrent adenopathy. Stable small low-attenuation lesion in the inferior right liver lobe compatible with treated metastasis. No new liver metastases. 3. Stable ectatic 4.0 cm ascending thoracic aorta. Recommend annual imaging followup by CTA or MRA.  4. Stable mild splenomegaly.     10/11/2017 - 12/2017 Chemotherapy    FOLFIRI and Avastin every 2 weeks starting 10/11/17, irnotecan stopped in 12/2017 due to disease progression      01/08/2018 Imaging    01/08/2018 CT CAP IMPRESSION: 1. Progressive hepatic and pulmonary metastatic disease. 2. Ascending Aortic aneurysm NOS (ICD10-I71.9).  01/08/2018 Progression    01/08/2018 CT CAP IMPRESSION: 1. Progressive hepatic and pulmonary metastatic disease. 2. Ascending Aortic aneurysm NOS (ICD10-I71.9).    01/10/2018 - 03/07/2018 Chemotherapy    FOLFOX and Avastin every 2 weeks starting on 01/10/2018. Stopped due to disease progression     03/19/2018 Progression    03/19/2018 CT CAP IMPRESSION: 1. Interval increase in size of dominant nodule within the right lower lobe. There are a few  additional nodules which have increased in size. Multiple additional small bilateral pulmonary nodules are grossly similar. 2. Slight interval increase in size of lesion within the right hepatic lobe. 3. Interval increase in soft tissue at the surgical anastomosis involving the small bowel within the central abdomen. Locally recurrent disease not excluded.     04/07/2018 - 04/2018 Chemotherapy    Third lineXeloda 1546m BID 7 days on/7 days off and Avastin every 2 weeksstarting 04/07/18 stopped after 1 cycle and 4 days because he started Clinical trail.     05/22/2018 [facility]    [facility]   LINES AND TUBES: None.  LOWER THORAX: Please see same day CT chest for findings above the diaphragm..Marland Kitchen HEPATOBILIARY: Low-attenuation lesion in segment 5 has minimally increased in size from prior measuring 2.4 x 2.0 cm, previously 2.3 x 1.8 cm when remeasured (8:42). The gallbladder is present and otherwise unremarkable. No biliary dilatation.   SPLEEN: Unremarkable. PANCREAS: Unremarkable.  ADRENALS: Unremarkable. KIDNEYS/URETERS: Unremarkable.  BLADDER: Decompressed making further evaluation difficult. PELVIC/REPRODUCTIVE ORGANS: Unremarkable.  GI TRACT: No dilated or thick walled loops of bowel. Sequelae of right hemicolectomy and appendectomy.   PERITONEUM/RETROPERITONEUM AND MESENTERY: Minimally decreased spiculated mesenteric soft tissue density at the site of enteroenteric anastomosis, now measuring roughly 2.6 x 1.4 cm, previously 3.3 x 3.1 cm (8:54). There is also a mild amount of stranding near the anastomosis within the mid abdomen (8:54)   LYMPH NODES: Borderline enlarged 1.0 cm porta hepatic lymph node, previously 0.7 cm (8:26). VESSELS: The aorta is normal in caliber.  No significant calcified atherosclerotic disease. Though the contrast timing is suboptimal for evaluation of the portal venous system, the portal venous system is appears patent. The  hepatic veins and IVC are unremarkable.  BONES AND SOFT TISSUES: Small fat-containing ventral hernia..    05/24/2018 - 08/02/2018 Chemotherapy    [facility] [id number] with palbociclib (Ibrnace) 1056m3 weeks on/1 week off and binimetinib 15 mg BID starting 05/24/18. Stopped 08/02/18 due to disease progression.     08/02/2018 Imaging    [facility]  Since 05/22/2018:  -Unchanged to mildly decreased in size segment 6 hepatic lesion.  -Unchanged irregular soft tissue density at the enteroenteric anastomosis.  -Increase in size of an irregular soft tissue density at the enterocolic anastomosis. This is new since 03/19/2018.  -Mild decrease in size of porta hepatis lymph node.  IMPRESSION:  Slight interval increase in multiple metastatic pulmonary nodules.  New likely malignant small right pleural effusion. Interval prominent right infrahilar lymph nodes; metastasis is in the differential. Mild interlobular septal thickening in the right middle and lower lobes is concerning for lymphangitic tumor spread. Attention on follow-up is suggested.      08/29/2018 Imaging    CT AP 08/29/18 IMPRESSION: 1. No acute CT findings of the abdomen or pelvis to explain abdominal pain.  2. Evidence of worsening metastatic colon malignancy status post right colon resection, including increasing mesenteric nodularity, increasing peritoneal nodularity, enlarging liver metastasis, and new  and enlarging pulmonary nodules.  3. Trace perihepatic ascites, of uncertain etiology, although presumably malignant given peritoneal metastatic disease.    09/02/2018 Imaging    CT chest 09/02/18  IMPRESSION: 1. Unfortunately there is interval increase in size of bilateral pulmonary nodules consistent with progression of pulmonary metastasis. 2. New small moderate RIGHT pleural effusion. 3. Interval increase in size of small mediastinal lymph nodes most consistent with metastatic  adenopathy. 4. Interval increase in size of hepatic metastasis in the inferior RIGHT hepatic lobe compared to CT 03/19/2018.    09/04/2018 -  Chemotherapy    oral Ragorafinib 35m daily 3 weeks on, 1 week off every 28 days as tolerated and IV Nivolumab 366mkg q2weeks starting 09/04/18      CURRENT THERAPY:  oral Ragorafinib8062mily 3 weeks on, 1 week off and IV Nivolumab 3mg52mq2weeks starting 09/04/18  INTERVAL HISTORY:  ChriDiar Berkelhere for a follow up and ongoing treatment. He presents to the clinic today by himself. He called his wife to be included in the visit. He notes when he wakes up in the morning he has pain. He hates Oxycodone 15mg36m another midday in the day and Dilauded 6mg a9might. He notes he still has pain with sleep and painful vivid dreams. He is not taking Tylenol-3. He stopped Fentanyl path because he is highly emotional and down to day 3. Day 2 he feels the best and day 1 improves.  He notes pain in his right flank pain which inhibits him to twists to the left. He still has mid-abdominal pain.  He notes when he takes a deep breath he has right lower chest pain. He takes Gabapentin TID. He is not taking Ativan currently to see which one works best for his sleep.  He notes he met his co-pay limit and he is now in his new year of coverage. He now has to pay deductible. He notes he is looking for a way to take showers with his fentanyl patch.  He notes he has returned to work, some days at home and others on site. He is not interested in doing disability. He notes he can feel pain at work and will use pain meds as needed.  He notes his appetite is still low. His weight is stable.  He notes having skin shin from Ragorafinib. He is on his third week. He notes no issues from Nivo. [facility] IncVIEW OF SYSTEMS:   Constitutional: Denies fevers, chills (+) Low appetite, stable weight (+) Trouble sleep Eyes: Denies blurriness of vision Ears, nose, mouth, throat, and face:  Denies mucositis or sore throat Respiratory: Denies cough, dyspnea or wheezes (+) right lower chest pain with deep breathing Cardiovascular: Denies palpitation, chest discomfort or lower extremity swelling Gastrointestinal:  Denies nausea, heartburn or change in bowel habits  (+) Mid abdominal pain  Skin: Denies abnormal skin rashes MSK; (+) Constant right flank pain  Lymphatics: Denies new lymphadenopathy or easy bruising Neurological:Denies numbness, tingling or new weaknesses Behavioral/Psych: Mood is stable, no new changes  All other systems were reviewed with the patient and are negative.  MEDICAL HISTORY:  Past Medical History:  Diagnosis Date  . Anxiety   . Cancer of ascending colon (HCC)  [place]   . Seasonal allergies     SURGICAL HISTORY: Past Surgical History:  Procedure Laterality Date  . APPENDECTOMY  1992  . CATARACT EXTRACTION W/ INTRAOCULAR LENS IMPLANT Left 04/2016  . COLON SURGERY    . COLONOSCOPY W/ BIOPSIES AND  POLYPECTOMY  04/2015  . INGUINAL HERNIA REPAIR Right 1982  . IR RADIOLOGIST EVAL & MGMT  01/16/2017  . KNEE ARTHROSCOPY W/ MENISCECTOMY Right 1998  . LAPAROSCOPIC RIGHT COLECTOMY N/A 06/14/2016   Procedure: LAPAROSCOPIC HAND ASSISTED HEMICOLECTOMY AND SMALL BOWEL RESECTION.;  Surgeon: [name], MD;  Location: [place];  Service: General;  Laterality: N/A;  . LIVER BIOPSY Right 06/14/2016   Procedure: LIVER BIOPSY;  Surgeon: [name], MD;  Location: [place];  Service: General;  Laterality: Right;  Right Inferior Liver  . PORTACATH PLACEMENT N/A 07/06/2016   Procedure: INSERTION PORT-A-CATH;  Surgeon: [name], MD;  Location: [place];  Service: General;  Laterality: N/A;  . VASECTOMY  2005    I have reviewed the social history and family history with the patient and they are unchanged from previous note.  ALLERGIES:  is allergic to penicillins and xeloda [capecitabine].  MEDICATIONS:  Current Outpatient Medications   Medication Sig Dispense Refill  . albuterol (VENTOLIN HFA) 108 (90 Base) MCG/ACT inhaler Inhale 1-2 puffs into the lungs every 6 (six) hours as needed for wheezing or shortness of breath. 1 Inhaler 2  . amLODipine (NORVASC) 10 MG tablet Take 1 tablet (10 mg total) by mouth daily. 30 tablet 5  . fentaNYL (DURAGESIC) 25 MCG/HR Place 1 patch onto the skin every other day. 15 patch 0  . gabapentin (NEURONTIN) 300 MG capsule Take 1 capsule (300 mg total) by mouth 3 (three) times daily. 90 capsule 1  . HYDROmorphone (DILAUDID) 2 MG tablet Take 1-2 tablets (2-4 mg total) by mouth every 3 (three) hours as needed for severe pain. 120 tablet 0  . LORazepam (ATIVAN) 1 MG tablet Take 1 tablet (1 mg total) by mouth every 4 (four) hours as needed for anxiety or sleep (nausea). 30 tablet 0  . losartan (COZAAR) 100 MG tablet Take 1 tablet (100 mg total) by mouth daily. 30 tablet 0  . Multiple Vitamin (MULTIVITAMIN WITH MINERALS) TABS tablet Take 1 tablet by mouth daily.    Marland Kitchen omeprazole (PRILOSEC) 20 MG capsule TAKE ONE CAPSULE BY MOUTH TWICE DAILY BEFORE A MEAL (Patient taking differently: Take 20 mg by mouth daily. ) 60 capsule 2  . oxyCODONE (ROXICODONE) 15 MG immediate release tablet Take 1 tablet (15 mg total) by mouth every 3 (three) hours as needed for moderate pain. 30 tablet 0  . polyethylene glycol (MIRALAX / GLYCOLAX) 17 g packet Take 17 g by mouth daily. 14 each 0  . prochlorperazine (COMPAZINE) 10 MG tablet Take 1 tablet (10 mg total) by mouth every 6 (six) hours as needed for nausea or vomiting. 30 tablet 3  . regorafenib (STIVARGA) 40 MG tablet Take 2 tablets (80 mg total) by mouth daily. Take for 3 weeks on, 1 week off, repeat every 4 weeks. Take with a low fat and low calorie meal 42 tablet 2  . senna-docusate (SENOKOT-S) 8.6-50 MG tablet Take 2 tablets by mouth 2 (two) times daily.    . sildenafil (REVATIO) 20 MG tablet Take 60-100 mg by mouth See admin instructions. Take 60-100 mg by mouth at least  30 minutes prior to sexual activity    . simethicone (MYLICON) 80 MG chewable tablet Chew 1 tablet (80 mg total) by mouth 4 (four) times daily as needed for flatulence. 30 tablet 0  . venlafaxine XR (EFFEXOR-XR) 150 MG 24 hr capsule Take 1 capsule (150 mg total) by mouth daily with breakfast.    . Oxycodone HCl 20 MG  TABS Take 1 tablet (20 mg total) by mouth every 8 (eight) hours as needed. 60 tablet 0   No current facility-administered medications for this visit.     PHYSICAL EXAMINATION: ECOG PERFORMANCE STATUS: 1 - Symptomatic but completely ambulatory  Vitals:   09/18/18 1318  BP: (!) 129/96  Pulse: 87  Resp: 18  Temp: 97.9 F (36.6 C)  SpO2: 98%   Filed Weights   09/18/18 1318  Weight: 188 lb 3.2 oz (85.4 kg)    GENERAL:alert, no distress and comfortable SKIN: skin color, texture, turgor are normal, no rashes or significant lesions EYES: normal, Conjunctiva are pink and non-injected, sclera clear OROPHARYNX:no exudate, no erythema and lips, buccal mucosa, and tongue normal  NECK: supple, thyroid normal size, non-tender, without nodularity LYMPH:  no palpable lymphadenopathy in the cervical, axillary or inguinal LUNGS: clear to auscultation and percussion with normal breathing effort HEART: regular rate & rhythm and no murmurs and no lower extremity edema ABDOMEN:abdomen soft, non-tender and normal bowel sounds Musculoskeletal:no cyanosis of digits and no clubbing  NEURO: alert & oriented x 3 with fluent speech, no focal motor/sensory deficits  LABORATORY DATA:  I have reviewed the data as listed CBC Latest Ref Rng & Units 09/18/2018 09/04/2018 08/31/2018  WBC 4.0 - 10.5 K/uL 5.9 6.8 4.6  Hemoglobin 13.0 - 17.0 g/dL 12.5(L) 15.0 12.7(L)  Hematocrit 39.0 - 52.0 % 37.9(L) 46.2 38.9(L)  Platelets 150 - 400 K/uL 179 233 177     CMP Latest Ref Rng & Units 09/18/2018 09/04/2018 08/31/2018  Glucose 70 - 99 mg/dL 85 93 93  BUN 6 - 20 mg/dL _0 Creatinine 0.61 - 1.24 mg/dL  0.91 1.13 0.95  Sodium 135 - 145 mmol/L 140 138 138  Potassium 3.5 - 5.1 mmol/L 4.2 4.2 4.0  Chloride 98 - 111 mmol/L 106 99 102  CO2 22 - 32 mmol/L _1 Calcium 8.9 - 10.3 mg/dL 8.9 10.0 9.3  Total Protein 6.5 - 8.1 g/dL 7.4 8.2(H) -  Total Bilirubin 0.3 - 1.2 mg/dL 0.7 1.2 -  Alkaline Phos 38 - 126 U/L 89 101 -  AST 15 - 41 U/L 31 57(H) -  ALT 0 - 44 U/L 24 98(H) -      RADIOGRAPHIC STUDIES: I have personally reviewed the radiological images as listed and agreed with the findings in the report. No results found.   ASSESSMENT & PLAN:  Jerrel Tiberio is a 58 y.o. male with   1. Right colon cancer with liver, node and lung metastasis, pT4bN2bM1, stage IV, MSI-stable, KRAS G12D mutation (+) -He was diagnosed in 04/2015. He is s/p small bowel resection, adjuvant mFOLFOX which was switched to FOLFIRINOX and then dose reduced to FOLFIRI. -He unfortunately he progressed on maintenance 5-FU, FOLFIRI and FOLFOX.  -Hestarted3rd line Xeloda andAvastinin 03/2018 but after 1 cycleand 3 days treated hestopped to [id number] 3 weeks on/1 week off andbinimetinib 15 mgBID starting 1/10/20withDr. LTruman Hayward-Unfortunatelyhe [facility] -He has developed worsening abdominal pain. CT Chest/AP from 08/2018 was negative for SBO but showed progressive metastatic cancer in colon, peritoneal, mesentery and new right sided pleural effusion and new pulmonary nodules. -He proceeded with 4th line therapy with Ragorafinib and Nivo based on the REGONIVO clinical trialdata(JCO 37(15), 2019). He started on 09/04/18. He is tolerating first cycle well overall.  -He is clinically doing better, back to work, but still has intermittent uncontrolled pain and nightmares.  He feels his fentanyl patch does not work on day 3, will increase fentanyl patch to every 48 hours.  -Will proceed with second dose  Nivo today. Continue Ragorafinib, on week 3, will be off next week.  -Labreviewed, adequate for treatment. UA negative.  -f/u in 2 weeks   2. Upper abdominal pain  -For the last few weeks he has had constant upper abdominal pain radiating across base of ribcage with occasion cramping (4-8/10).  -I again advised him to avoid alcohol consumption as this can further effect his liver and ability to continue treatment. He is willing to reduce his alcohol intake. -CT AP from 08/29/18 showed worsening metastatic colon malignancy and mesenteric and peritoneal nodularity.  -His worsening pain is related to his disease progression exacerbated by his anxiety and depression. He was recently hospitalized for pain management. -Starting 09/05/17 will increase fentanyl to 2 patches every 3 days. I recommend he try oxycodone 15-17m again, but if not tolerable continue dilaudid 440m every 3 hours as needed. He should take mainly at night and try to use less often during the day.  -I previously increased his Gabapentin to 30019mt night to help him sleep. (09/04/18) -We previously reviewed the side effects of narcotics, and narcotics dependence. I discussed if we can improve pain with chemo, I will reduce narcotics use. He is agreeable.  -He still has constant right flank pain, midabdominal pain, and right lower chest pain with deep breathing.  -He can use heating pad over right flank incase this is muscular related.  -Starting 09/18/18, will continue Fentanyl 25m30mr, but change it every 48 hours. Continue Oxycodone 15-20mg61mand Noon and Dilauded 6mg a67might and Gabapentin TID. -we discussed that if his pain improves with cancer treatment, will decrease his narcotics, he agrees    3. Depression, Anxiety, Insomnia  -Pt has long-standing history of depression, has been on Prozac for decades, it was very well controlled in the past.  -He has good family and social support. -He has become more  depressedagainwith disease progressionand stress of prognosis.  -I previously offered chance to speak with our SW [facility]seling. He declined for now.  -He denies suicidal or self harm thought or ideology.  -I again strongly encouraged him to f/u with his Psychiatrist, he is agreeable.  -He gets interrupted sleep for 7.5 hours. Currently onEffexor and Ativan. He has been taking Gabapentin TID.  -I reviewed sleep hygiene and recommend he reduce fluid intake at night.   4. Hypertension  -OnAmlodipineand Lisinopril, I refilled today -He does not want to follow-up with his primary care physician. -BP at 129/96today (09/18/18), uncontrolled, partially related to his pain and anxiety.  I encouraged him to monitor at home. -We discussed that Ragorafinib may increase his blood pressure.  5. Anemia of iron deficiency and chemo  -He has mild anemia, likely related to his colon cancer bleeding. -Continueoral iron supplements   6. Constipation  -likely secondary to pain medication -He only had 1 BM last week. I advised him to take 1/2 at a time of Milk of magnesium or magnesium citrate for severe constipation.  -Continue Miralax or Senakot daily. If he needs to he can take an enema.    7. Anorexia and weight loss  -Secondary to underlying malignancy -He will continue nutritionalsupplement -He continues to have weight loss especially with worsened abdominal pain and disease progression.  -He will consider Marinol to boost appetite and help with nausea.  -He will continue ativan.  -  His appetite is still low. He will continue to use and increase nutritional supplement as needed.   8. Goal of care discussion, on DNR/DNI  -The patient understands the goal of care is palliative.  -He notes he has updated his will to include a DNR.     Plan -Proceed with second dose Nivo today. It was denied by his insurance, I appealed yesterday but still denied. We are applying drug  replacement for him  -Continue Ragorafinib, on week 3, will be off next week.  -Lab, f/u and nivo in 2 weeks     No problem-specific Assessment & Plan notes found for this encounter.   No orders of the defined types were placed in this encounter.  All questions were answered. The patient knows to call the clinic with any problems, questions or concerns. No barriers to learning was detected. I spent 30 minutes counseling the patient face to face. The total time spent in the appointment was 40 minutes and more than 50% was on counseling and review of test results     Truitt Merle, MD 09/18/2018   I, Joslyn Devon, am acting as scribe for Truitt Merle, MD.   I have reviewed the above documentation for accuracy and completeness, and I agree with the above.    Addendum  Pt developed severe reaction towards the end of the nivolumab infusion.  Per patient and his nurse, he started with bilateral lower extremity pain, went to bathroom, came back and started having severe riggers, dyspnea with transit hypoxia, numbness at fingers and tongue,  and hypertension. PA Lucianne Lei saw him, give 2 doses of Demerol, Benadryl and Tylenol. His symptoms resolved completely. I saw him in the infusion room, will decrease his Nivo infusion rate with premedication Benadryl, Tylenol and Pepcid next time. He knows to call me if he has new symptoms after discharge.   Truitt Merle  09/18/2018

## 2018-09-17 ENCOUNTER — Telehealth: Payer: Self-pay | Admitting: *Deleted

## 2018-09-17 ENCOUNTER — Other Ambulatory Visit: Payer: Self-pay | Admitting: *Deleted

## 2018-09-17 DIAGNOSIS — C189 Malignant neoplasm of colon, unspecified: Secondary | ICD-10-CM

## 2018-09-17 DIAGNOSIS — C787 Secondary malignant neoplasm of liver and intrahepatic bile duct: Principal | ICD-10-CM

## 2018-09-17 DIAGNOSIS — E860 Dehydration: Secondary | ICD-10-CM

## 2018-09-17 NOTE — Telephone Encounter (Signed)
CSW briefly met with patient in infusion during last treatment.  CSW contacted patient by phone today to follow up from conversation.  Patient currently working, unable to talk.  CSW will contact patient at a later time.  Maryjean Morn, MSW, LCSW, OSW-C Clinical Social Worker [facility] [phone]

## 2018-09-18 ENCOUNTER — Other Ambulatory Visit: Payer: Self-pay

## 2018-09-18 ENCOUNTER — Telehealth: Payer: Self-pay | Admitting: Hematology

## 2018-09-18 ENCOUNTER — Inpatient Hospital Stay: Payer: BLUE CROSS/BLUE SHIELD

## 2018-09-18 ENCOUNTER — Encounter: Payer: Self-pay | Admitting: Hematology

## 2018-09-18 ENCOUNTER — Inpatient Hospital Stay: Payer: BLUE CROSS/BLUE SHIELD | Attending: Hematology

## 2018-09-18 ENCOUNTER — Inpatient Hospital Stay (HOSPITAL_BASED_OUTPATIENT_CLINIC_OR_DEPARTMENT_OTHER): Payer: BLUE CROSS/BLUE SHIELD | Admitting: Medical

## 2018-09-18 ENCOUNTER — Inpatient Hospital Stay (HOSPITAL_BASED_OUTPATIENT_CLINIC_OR_DEPARTMENT_OTHER): Payer: BLUE CROSS/BLUE SHIELD | Admitting: Hematology

## 2018-09-18 VITALS — BP 129/96 | HR 87 | Temp 97.9°F | Resp 18 | Ht 70.0 in | Wt 188.2 lb

## 2018-09-18 VITALS — BP 132/91 | HR 106 | Temp 99.4°F | Resp 18

## 2018-09-18 DIAGNOSIS — R630 Anorexia: Secondary | ICD-10-CM

## 2018-09-18 DIAGNOSIS — C787 Secondary malignant neoplasm of liver and intrahepatic bile duct: Secondary | ICD-10-CM

## 2018-09-18 DIAGNOSIS — Z66 Do not resuscitate: Secondary | ICD-10-CM | POA: Diagnosis not present

## 2018-09-18 DIAGNOSIS — C182 Malignant neoplasm of ascending colon: Secondary | ICD-10-CM

## 2018-09-18 DIAGNOSIS — F419 Anxiety disorder, unspecified: Secondary | ICD-10-CM | POA: Insufficient documentation

## 2018-09-18 DIAGNOSIS — Z87891 Personal history of nicotine dependence: Secondary | ICD-10-CM | POA: Diagnosis not present

## 2018-09-18 DIAGNOSIS — R1312 Dysphagia, oropharyngeal phase: Secondary | ICD-10-CM

## 2018-09-18 DIAGNOSIS — G4700 Insomnia, unspecified: Secondary | ICD-10-CM

## 2018-09-18 DIAGNOSIS — R634 Abnormal weight loss: Secondary | ICD-10-CM | POA: Insufficient documentation

## 2018-09-18 DIAGNOSIS — C189 Malignant neoplasm of colon, unspecified: Secondary | ICD-10-CM

## 2018-09-18 DIAGNOSIS — M545 Low back pain: Secondary | ICD-10-CM | POA: Diagnosis not present

## 2018-09-18 DIAGNOSIS — Z79899 Other long term (current) drug therapy: Secondary | ICD-10-CM | POA: Insufficient documentation

## 2018-09-18 DIAGNOSIS — R110 Nausea: Secondary | ICD-10-CM

## 2018-09-18 DIAGNOSIS — D509 Iron deficiency anemia, unspecified: Secondary | ICD-10-CM

## 2018-09-18 DIAGNOSIS — G893 Neoplasm related pain (acute) (chronic): Secondary | ICD-10-CM

## 2018-09-18 DIAGNOSIS — G620 Drug-induced polyneuropathy: Secondary | ICD-10-CM

## 2018-09-18 DIAGNOSIS — G629 Polyneuropathy, unspecified: Secondary | ICD-10-CM

## 2018-09-18 DIAGNOSIS — C7801 Secondary malignant neoplasm of right lung: Secondary | ICD-10-CM

## 2018-09-18 DIAGNOSIS — I10 Essential (primary) hypertension: Secondary | ICD-10-CM | POA: Diagnosis not present

## 2018-09-18 DIAGNOSIS — Z5112 Encounter for antineoplastic immunotherapy: Secondary | ICD-10-CM | POA: Diagnosis not present

## 2018-09-18 DIAGNOSIS — C7802 Secondary malignant neoplasm of left lung: Secondary | ICD-10-CM | POA: Insufficient documentation

## 2018-09-18 DIAGNOSIS — T8090XA Unspecified complication following infusion and therapeutic injection, initial encounter: Secondary | ICD-10-CM

## 2018-09-18 DIAGNOSIS — Z95828 Presence of other vascular implants and grafts: Secondary | ICD-10-CM

## 2018-09-18 DIAGNOSIS — T451X5A Adverse effect of antineoplastic and immunosuppressive drugs, initial encounter: Secondary | ICD-10-CM

## 2018-09-18 DIAGNOSIS — E860 Dehydration: Secondary | ICD-10-CM

## 2018-09-18 LAB — CBC WITH DIFFERENTIAL/PLATELET
Abs Immature Granulocytes: 0.01 10*3/uL (ref 0.00–0.07)
Basophils Absolute: 0.1 10*3/uL (ref 0.0–0.1)
Basophils Relative: 1 %
Eosinophils Absolute: 0.1 10*3/uL (ref 0.0–0.5)
Eosinophils Relative: 2 %
HCT: 37.9 % — ABNORMAL LOW (ref 39.0–52.0)
Hemoglobin: 12.5 g/dL — ABNORMAL LOW (ref 13.0–17.0)
Immature Granulocytes: 0 %
Lymphocytes Relative: 11 %
Lymphs Abs: 0.7 10*3/uL (ref 0.7–4.0)
MCH: 31.6 pg (ref 26.0–34.0)
MCHC: 33 g/dL (ref 30.0–36.0)
MCV: 95.9 fL (ref 80.0–100.0)
Monocytes Absolute: 0.9 10*3/uL (ref 0.1–1.0)
Monocytes Relative: 15 %
Neutro Abs: 4.1 10*3/uL (ref 1.7–7.7)
Neutrophils Relative %: 71 %
Platelets: 179 10*3/uL (ref 150–400)
RBC: 3.95 MIL/uL — ABNORMAL LOW (ref 4.22–5.81)
RDW: 13.7 % (ref 11.5–15.5)
WBC: 5.9 10*3/uL (ref 4.0–10.5)
nRBC: 0 % (ref 0.0–0.2)

## 2018-09-18 LAB — URINALYSIS, COMPLETE (UACMP) WITH MICROSCOPIC
Bacteria, UA: NONE SEEN
Bilirubin Urine: NEGATIVE
Glucose, UA: NEGATIVE mg/dL
Hgb urine dipstick: NEGATIVE
Ketones, ur: NEGATIVE mg/dL
Leukocytes,Ua: NEGATIVE
Nitrite: NEGATIVE
Protein, ur: NEGATIVE mg/dL
Specific Gravity, Urine: 1.013 (ref 1.005–1.030)
pH: 5 (ref 5.0–8.0)

## 2018-09-18 LAB — COMPREHENSIVE METABOLIC PANEL
ALT: 24 U/L (ref 0–44)
AST: 31 U/L (ref 15–41)
Albumin: 3.8 g/dL (ref 3.5–5.0)
Alkaline Phosphatase: 89 U/L (ref 38–126)
Anion gap: 10 (ref 5–15)
BUN: 13 mg/dL (ref 6–20)
CO2: 24 mmol/L (ref 22–32)
Calcium: 8.9 mg/dL (ref 8.9–10.3)
Chloride: 106 mmol/L (ref 98–111)
Creatinine, Ser: 0.91 mg/dL (ref 0.61–1.24)
GFR calc Af Amer: >60 mL/min (ref >60)
GFR calc non Af Amer: >60 mL/min (ref >60)
Glucose, Bld: 85 mg/dL (ref 70–99)
Potassium: 4.2 mmol/L (ref 3.5–5.1)
Sodium: 140 mmol/L (ref 135–145)
Total Bilirubin: 0.7 mg/dL (ref 0.3–1.2)
Total Protein: 7.4 g/dL (ref 6.5–8.1)

## 2018-09-18 LAB — TROPONIN I: Troponin I: <0.03 ng/mL (ref <0.03)

## 2018-09-18 LAB — MAGNESIUM: Magnesium: 2 mg/dL (ref 1.7–2.4)

## 2018-09-18 LAB — TSH: TSH: 2.231 u[IU]/mL (ref 0.320–4.118)

## 2018-09-18 LAB — PHOSPHORUS: Phosphorus: 4 mg/dL (ref 2.5–4.6)

## 2018-09-18 MED ORDER — CLONIDINE HCL 0.1 MG PO TABS
ORAL_TABLET | ORAL | Status: AC
  Filled 2018-09-18: qty 1

## 2018-09-18 MED ORDER — OXYCODONE HCL 20 MG PO TABS: 20.0000 mg | ORAL_TABLET | Freq: Three times a day (TID) | ORAL | 0 refills | Status: DC | PRN

## 2018-09-18 MED ORDER — SODIUM CHLORIDE 0.9% FLUSH
10.0000 mL | Freq: Once | INTRAVENOUS | Status: AC
  Administered 2018-09-18: 10 mL
  Filled 2018-09-18: qty 10

## 2018-09-18 MED ORDER — SODIUM CHLORIDE 0.9 % IV SOLN
Freq: Once | INTRAVENOUS | Status: AC
  Administered 2018-09-18: 15:00:00 via INTRAVENOUS
  Filled 2018-09-18: qty 250

## 2018-09-18 MED ORDER — MEPERIDINE HCL 25 MG/ML IJ SOLN
25.0000 mg | Freq: Once | INTRAMUSCULAR | Status: AC
  Administered 2018-09-18: 16:00:00 25 mg via INTRAVENOUS

## 2018-09-18 MED ORDER — HEPARIN SOD (PORK) LOCK FLUSH 100 UNIT/ML IV SOLN
500.0000 [IU] | Freq: Once | INTRAVENOUS | Status: AC | PRN
  Administered 2018-09-18: 500 [IU]
  Filled 2018-09-18: qty 5

## 2018-09-18 MED ORDER — ONDANSETRON HCL 4 MG/2ML IJ SOLN
8.0000 mg | Freq: Once | INTRAMUSCULAR | Status: AC
  Administered 2018-09-18: 8 mg via INTRAVENOUS

## 2018-09-18 MED ORDER — SODIUM CHLORIDE 0.9% FLUSH
10.0000 mL | INTRAVENOUS | Status: DC | PRN
  Administered 2018-09-18: 17:00:00 10 mL
  Filled 2018-09-18: qty 10

## 2018-09-18 MED ORDER — MEPERIDINE HCL 25 MG/ML IJ SOLN
INTRAMUSCULAR | Status: AC
  Filled 2018-09-18: qty 1

## 2018-09-18 MED ORDER — FAMOTIDINE IN NACL 20-0.9 MG/50ML-% IV SOLN
20.0000 mg | Freq: Once | INTRAVENOUS | Status: AC
  Administered 2018-09-18: 20 mg via INTRAVENOUS

## 2018-09-18 MED ORDER — CLONIDINE HCL 0.1 MG PO TABS
0.1000 mg | ORAL_TABLET | Freq: Once | ORAL | Status: AC
  Administered 2018-09-18: 0.1 mg via ORAL

## 2018-09-18 MED ORDER — SODIUM CHLORIDE 0.9 % IV SOLN
3.2000 mg/kg | Freq: Once | INTRAVENOUS | Status: AC
  Administered 2018-09-18: 15:00:00 280 mg via INTRAVENOUS
  Filled 2018-09-18: qty 24

## 2018-09-18 MED ORDER — MEPERIDINE HCL 25 MG/ML IJ SOLN
25.0000 mg | Freq: Once | INTRAMUSCULAR | Status: AC
  Administered 2018-09-18: 25 mg via INTRAVENOUS

## 2018-09-18 MED ORDER — ONDANSETRON HCL 4 MG/2ML IJ SOLN
INTRAMUSCULAR | Status: AC
  Filled 2018-09-18: qty 4

## 2018-09-18 MED ORDER — FENTANYL 25 MCG/HR TD PT72: 1.0000 | MEDICATED_PATCH | TRANSDERMAL | 0 refills | Status: DC

## 2018-09-18 NOTE — Telephone Encounter (Signed)
No los per 5/6.

## 2018-09-18 NOTE — Progress Notes (Signed)
Per [name], patient is OK to treat prior to the return of lab results.   At 1600, toward's then end of patient's treatment, the patient c/o of heaviness in the legs, and nausea.  Zofran was given. The patient then c/o chills,  and rigors began. He complained of his fingers and tongue feeling numb.  At 1615 demerol was given with Sandi Mealy at chairside.  See MAR for other medications given.  Around 1648, the patient's symptoms began to resolve. The nurse was instructed by [name] to let the patient go home, once vitals resolved.

## 2018-09-20 NOTE — Progress Notes (Signed)
    DATE:  09/18/2018                                          X  CHEMO/IMMUNOTHERAPY REACTION            MD:  Dr. Truitt Merle   AGENT/BLOOD PRODUCT RECEIVING TODAY:               Nivolumab   AGENT/BLOOD PRODUCT RECEIVING IMMEDIATELY PRIOR TO REACTION:           Nivolumab   VS: BP:      163/105   P:        108       SPO2:        100% on room air T: 97.6                BP:      178/118   P:        105       SPO2:        100% on room air   BP:      148/101   P:        105       SPO2:        100% on room air   REACTION(S):           Nausea, hypertension, tachycardia, oral numbness, numbness of the fingers, and rigors   PREMEDS:       None   INTERVENTION: Zofran 8 mg IV x1, Demerol 25 mg IV x1, Pepcid 20 mg IV x1, Demerol 25 mg IV x1, and clonidine 0.1 mg p.o. x1   Review of Systems  Review of Systems  Constitutional: Positive for chills. Negative for diaphoresis and fever.  HENT: Negative for trouble swallowing and voice change.   Respiratory: Negative for cough, chest tightness, shortness of breath and wheezing.   Cardiovascular: Negative for chest pain and palpitations.  Gastrointestinal: Positive for nausea and vomiting. Negative for abdominal pain, constipation and diarrhea.  Musculoskeletal: Negative for back pain and myalgias.  Neurological: Positive for tremors and numbness. Negative for dizziness, light-headedness and headaches.     Physical Exam  Physical Exam Constitutional:      General: He is not in acute distress.    Appearance: He is not diaphoretic.  HENT:     Head: Normocephalic and atraumatic.  Cardiovascular:     Rate and Rhythm: Regular rhythm. Tachycardia present.     Heart sounds: Normal heart sounds. No murmur. No friction rub. No gallop.   Pulmonary:     Effort: Pulmonary effort is normal. No respiratory distress.     Breath sounds: Normal breath sounds. No wheezing or rales.  Skin:    General: Skin is warm and dry.     Findings: No erythema or rash.   Neurological:     Mental Status: He is alert.     Comments: rigors noted     OUTCOME:               The patient nearly completed his infusion of nivolumab.  His symptoms abated after the above intervention.  The patient was seen with [name].  His family came and transported him home.   Sandi Mealy, MHS, PA-C

## 2018-09-21 LAB — CK ISOENZYMES
CK-BB: 0 %
CK-MB: 0 % (ref 0–3)
CK-MM: 100 % (ref 97–100)
Creatine Kinase-Total: 206 U/L (ref 41–331)
Macro Type 1: 0 %
Macro Type 2: 0 %

## 2018-09-23 ENCOUNTER — Encounter (HOSPITAL_COMMUNITY): Payer: Self-pay | Admitting: Pharmacy Technician

## 2018-09-23 ENCOUNTER — Telehealth: Payer: Self-pay

## 2018-09-23 NOTE — Telephone Encounter (Signed)
Faxed lab results to Dr. Truman Hayward at [facility] as requested.

## 2018-09-30 NOTE — Progress Notes (Signed)
[name]   [phone] [phone]   Clinic Follow up Note   Patient Care Team: Curlene Labrum, MD as PCP - General (Family Medicine)  Date of Service:  10/02/2018  CHIEF COMPLAINT: F/u of metastatic colon cancer  SUMMARY OF ONCOLOGIC HISTORY: Oncology History   Cancer Staging Metastatic colon cancer to liver [facility]) Staging form: Colon and Rectum, AJCC 8th Edition - Clinical stage from 06/01/2016: Stage IVA (cTX, cNX, pM1a) - Signed by Truitt Merle, MD on 07/04/2016 - Pathologic stage from 06/14/2016: Stage IVA (pT4b(m), pN2b, pM1a) - Signed by Truitt Merle, MD on 07/04/2016       Metastatic colon cancer to liver ([place])   04/2015 Procedure    Colonoscopy by Dr. Ladona Horns. It showed showed 2 sessile polyps ready between 3-5 mm in size located 20 cm (A, B) from the point of entry, polypectomy was performed. Pedunculated polyp was found in the ascending colon (C), polypectomy was performed, and additional polyp (D) was found 30 cm from the point of entry, removed    04/2015 Pathology Results    tubular adenoma (A and B), and well differentiated adenocarcinoma arising from tubulovillous adenoma (C) and well differentiated adenocarcinoma arising from severe dysplasia to intramucosal carcinoma within tubular adenoma.     04/2015 Initial Diagnosis    Metastatic colon cancer to liver ([place])    05/29/2016 Imaging    CT abdomen and pelvis with contrast showed an apple core like stricture in right colon just above the cecum, measuring 3.2 cm in lengths. This is highly suspicious for malignancy. Small lymph node a noticed he had adjacent mesentery, measuring 8 mm. There is a low-density lesion within the inferior aspect of the right hepatic lobe measuring 1.7 cm, suspicious for metastasis.     06/06/2016 Tumor Marker    CEA 9.99    06/08/2016 PET scan    IMPRESSION: Approximately 3 cm hypermetabolic mass in the ascending colon, consistent with primary colon  carcinoma. This mass results in colonic obstruction and small bowel dilatation. Additional areas of hypermetabolic wall thickening in the cecum may represent other sites of colon carcinoma or colitis. Mild hypermetabolic lymphadenopathy in right pericolonic region, porta hepatis, and aortocaval space, consistent with metastatic disease. Mild hypermetabolic mediastinal lymphadenopathy also seen, and thoracic lymph node metastases cannot be excluded. Solitary hypermetabolic focus in inferior right hepatic lobe, consistent with liver metastasis. Consider abdomen MRI without and with contrast for further evaluation.    06/14/2016 Surgery    Hand assisted right hemicolectomy and small bowel resection for colon cancer, liver biopsy, by Dr. Barry Dienes    06/14/2016 Pathology Results    Right hemicolectomy showed invasive well to moderately differentiated adenocarcinoma, 2 foci measuring 7.5 cm and 4.5 cm, tumor invades through full thickness of colon, to the seroma and involve the Small Bowel, Surgical Margins Were Negative, 24 Out Of 64 Lymph Nodes Were Positive, Extracapsular Extension Identified, Multiple Satellite Tumor Deposits Present, Liver Biopsy Showed Metastatic Adenocarcinoma.      06/14/2016 Miscellaneous    Tumor MMR normal, MSI stable     06/14/2016 Miscellaneous    Foundation one genomic testing showed K-ras G12 D mutation, APC and TP53 mutation. No BRAF and NRAS mutation. MSI-stable, tumor burden low.    07/06/2016 Tumor Marker    CEA 13.69    07/13/2016 - 01/18/2017 Chemotherapy    mFOLFOX, every 2 weeks, started on 07/14/2015, Avastin added from cycle 3  Oxaliplatin dose to '60mg'$ /m2 due to side effects and some  cytopenia on 09/21/16  Changed to FOLFIRINOX starting cycle 7 and Reduced  Due to neuropathy hold Oxaliplatin and add Irinotecan with neulasta on day 3 starting with cycle 7 Add low dose Oxaliplatin with cycle 8.  Due to his worsening neuropathy, and good response to chemotherapy, I  previously stopped oxaliplatin from cycle 11, and continue FOLFIRI and avastin       07/27/2016 Tumor Marker    CEA 15.85    09/04/2016 Imaging    Ct C/A/P W Contrast IMPRESSION: Interval right colectomy. Stable small liver metastasis in the inferior right hepatic lobe. Stable mild porta hepatis and aortocaval lymphadenopathy. Stable mild mediastinal lymphadenopathy. No new or progressive metastatic disease identified within the chest, abdomen, or pelvis.    12/19/2016 PET scan    IMPRESSION: 1. Right hemicolectomy, with resolution of the prior hypermetabolic activity inferiorly in the right hepatic lobe, in several mediastinal lymph nodes, and in lymph nodes in the retroperitoneum and porta hepatis. No residual hypermetabolic or enlarged lymph nodes are identified. 2. Low-grade diffuse skeletal metabolic activity is likely therapy related. 3. Coronary atherosclerosis. 4. 3 by 4 mm right middle lobe pulmonary nodule is stable, not appreciably hypermetabolic, but below sensitive PET-CT size thresholds. This may warrant surveillance.    01/11/2017 Imaging    MR Abdomen W WO Contrast IMPRESSION: 1. No acute findings within the abdomen. Previously noted liver metastasis has resolved in the interval. No new lesions.    02/01/2017 - 09/2017 Chemotherapy    Maintenance therapy, Xeloda '2000mg'$  (1000 mg/m2)  q12h on day 1-14 every 21 days plus AVASTIN, starting 02/01/2017.  stopped after 12 days due to poor tolerance on 02/14/17  Changed to maintenance 5-FU and avastin every 2 weeks starting on 02/22/17, stopped in 09/2017.       04/10/2017 Imaging    IMPRESSION: 1. Status post right hemicolectomy without findings for recurrent tumor. 2. No worrisome hepatic lesions. Treated disease with only a small residual low attenuation lesion in the right hepatic lobe. 3. No recurrent mediastinal or abdominal lymphadenopathy.     07/09/2017 PET scan    PET 07/09/17 IMPRESSION: 1. Status post right  hemicolectomy, without findings of hypermetabolic recurrent or metastatic disease. 2. A right middle lobe pulmonary nodule is unchanged. However, there is a right lower lobe 5 mm pulmonary nodule which is felt to be new and enlarged compared to prior exams. Suspicious for an isolated pulmonary metastasis. Consider CT follow-up at 3-6 months. 3. Age advanced coronary artery atherosclerosis. Recommend assessment of coronary risk factors and consideration of medical therapy. 4. Borderline ascending aortic dilatation, 4.0 cm      10/10/2017 Imaging    IMPRESSION: 1. Several (at least 10) subcentimeter pulmonary nodules scattered in both lungs, predominantly in the lower lobes, all new/increased, most compatible with enlarging pulmonary metastases, largest 8 mm in the right lower lobe. 2. No additional findings of new or progressive metastatic disease. No recurrent adenopathy. Stable small low-attenuation lesion in the inferior right liver lobe compatible with treated metastasis. No new liver metastases. 3. Stable ectatic 4.0 cm ascending thoracic aorta. Recommend annual imaging followup by CTA or MRA.  4. Stable mild splenomegaly.     10/11/2017 - 12/2017 Chemotherapy    FOLFIRI and Avastin every 2 weeks starting 10/11/17, irnotecan stopped in 12/2017 due to disease progression      01/08/2018 Imaging    01/08/2018 CT CAP IMPRESSION: 1. Progressive hepatic and pulmonary metastatic disease. 2. Ascending Aortic aneurysm NOS (ICD10-I71.9).    01/08/2018 Progression  01/08/2018 CT CAP IMPRESSION: 1. Progressive hepatic and pulmonary metastatic disease. 2. Ascending Aortic aneurysm NOS (ICD10-I71.9).    01/10/2018 - 03/07/2018 Chemotherapy    FOLFOX and Avastin every 2 weeks starting on 01/10/2018. Stopped due to disease progression     03/19/2018 Progression    03/19/2018 CT CAP IMPRESSION: 1. Interval increase in size of dominant nodule within the right lower lobe. There are a few  additional nodules which have increased in size. Multiple additional small bilateral pulmonary nodules are grossly similar. 2. Slight interval increase in size of lesion within the right hepatic lobe. 3. Interval increase in soft tissue at the surgical anastomosis involving the small bowel within the central abdomen. Locally recurrent disease not excluded.     04/07/2018 - 04/2018 Chemotherapy    Third lineXeloda 1592m BID 7 days on/7 days off and Avastin every 2 weeksstarting 04/07/18 stopped after 1 cycle and 4 days because he started Clinical trail.     05/22/2018 [facility]    [facility]   LINES AND TUBES: None.  LOWER THORAX: Please see same day CT chest for findings above the diaphragm..Marland Kitchen HEPATOBILIARY: Low-attenuation lesion in segment 5 has minimally increased in size from prior measuring 2.4 x 2.0 cm, previously 2.3 x 1.8 cm when remeasured (8:42). The gallbladder is present and otherwise unremarkable. No biliary dilatation.   SPLEEN: Unremarkable. PANCREAS: Unremarkable.  ADRENALS: Unremarkable. KIDNEYS/URETERS: Unremarkable.  BLADDER: Decompressed making further evaluation difficult. PELVIC/REPRODUCTIVE ORGANS: Unremarkable.  GI TRACT: No dilated or thick walled loops of bowel. Sequelae of right hemicolectomy and appendectomy.   PERITONEUM/RETROPERITONEUM AND MESENTERY: Minimally decreased spiculated mesenteric soft tissue density at the site of enteroenteric anastomosis, now measuring roughly 2.6 x 1.4 cm, previously 3.3 x 3.1 cm (8:54). There is also a mild amount of stranding near the anastomosis within the mid abdomen (8:54)   LYMPH NODES: Borderline enlarged 1.0 cm porta hepatic lymph node, previously 0.7 cm (8:26). VESSELS: The aorta is normal in caliber.  No significant calcified atherosclerotic disease. Though the contrast timing is suboptimal for evaluation of the portal venous system, the portal venous system is appears patent. The  hepatic veins and IVC are unremarkable.  BONES AND SOFT TISSUES: Small fat-containing ventral hernia..    05/24/2018 - 08/02/2018 Chemotherapy    [facility] [id number] with palbociclib (Ibrnace) 1047m3 weeks on/1 week off and binimetinib 15 mg BID starting 05/24/18. Stopped 08/02/18 due to disease progression.     08/02/2018 Imaging    [facility]  Since 05/22/2018:  -Unchanged to mildly decreased in size segment 6 hepatic lesion.  -Unchanged irregular soft tissue density at the enteroenteric anastomosis.  -Increase in size of an irregular soft tissue density at the enterocolic anastomosis. This is new since 03/19/2018.  -Mild decrease in size of porta hepatis lymph node.  IMPRESSION:  Slight interval increase in multiple metastatic pulmonary nodules.  New likely malignant small right pleural effusion. Interval prominent right infrahilar lymph nodes; metastasis is in the differential. Mild interlobular septal thickening in the right middle and lower lobes is concerning for lymphangitic tumor spread. Attention on follow-up is suggested.      08/29/2018 Imaging    CT AP 08/29/18 IMPRESSION: 1. No acute CT findings of the abdomen or pelvis to explain abdominal pain.  2. Evidence of worsening metastatic colon malignancy status post right colon resection, including increasing mesenteric nodularity, increasing peritoneal nodularity, enlarging liver metastasis, and new and enlarging pulmonary nodules.  3. Trace perihepatic ascites, of uncertain etiology, although presumably malignant given peritoneal metastatic disease.    09/02/2018 Imaging    CT chest 09/02/18  IMPRESSION: 1. Unfortunately there is interval increase in size of bilateral pulmonary nodules consistent with progression of pulmonary metastasis. 2. New small moderate RIGHT pleural effusion. 3. Interval increase in size of small mediastinal lymph nodes most consistent with metastatic  adenopathy. 4. Interval increase in size of hepatic metastasis in the inferior RIGHT hepatic lobe compared to CT 03/19/2018.    09/04/2018 -  Chemotherapy    oral Ragorafinib '80mg'$  daily 3 weeks on, 1 week off every 28 days as tolerated and IV Nivolumab '3mg'$ /kg q2weeks starting 09/04/18      CURRENT THERAPY:  oral Ragorafinib'80mg'$ daily3 weeks on, 1 week offand IV Nivolumab '3mg'$ /kgq2weeks starting 09/04/18  INTERVAL HISTORY:  Nicholas Lowery is here for a follow up and ongoing treatment. He is here alone. His pain is currently manageable with oxycodone '20mg'$  2-3 times during the day and dilauded at night. He would like his oxycodone in '10mg'$  with next refill so he can start to reduce his use as needed. He notes he has been having BM every 4 days. He has been taking stool softener and Senakot-s. He still has diffuse upper abdominal pain. He notes having difficulty starting urine stream. He notes he urinates 2-3 times a night. He also notes lower abdominal side pain over kidneys. He notes pain exacerbated by truck twist and constipation. He denies prostate issues or dysuria. He notes he is no longer drinking alcohol.  His BM are not hard but very forest green. He notes his appetite has significantly decreased and he has lost about 7 pounds. He takes carnation breakfast twice in a morning and skip lunch.  He notes right knee arthritis which is bone on bone. He was offered replacement but instead is receiving injections.  His wife notes he has a $20,000 bill but forget to bring today. This is for his drug replacement.     REVIEW OF SYSTEMS:   Constitutional: Denies fevers, chills (+) Low appetite, weight loss Eyes: Denies blurriness of vision Ears, nose, mouth, throat, and face: Denies mucositis or sore throat Respiratory: Denies cough, dyspnea or wheezes Cardiovascular: Denies palpitation, chest discomfort or lower extremity swelling Gastrointestinal:  Denies nausea, heartburn (+) upper  abdominal pain (+) constipation  UA: (+) Difficulty starting stream MSK: (+) B/l lower back pain (+) Right knee arthritis  Skin: Denies abnormal skin rashes Lymphatics: Denies new lymphadenopathy or easy bruising Neurological:Denies numbness, tingling or new weaknesses Behavioral/Psych: Mood is stable, no new changes  All other systems were reviewed with the patient and are negative.  MEDICAL HISTORY:  Past Medical History:  Diagnosis Date   Anxiety    Cancer of ascending colon ([place])    Hypertension    Seasonal allergies     SURGICAL HISTORY: Past Surgical History:  Procedure Laterality Date   APPENDECTOMY  1992   CATARACT EXTRACTION W/ INTRAOCULAR LENS IMPLANT Left 04/2016   COLON SURGERY     COLONOSCOPY W/ BIOPSIES AND POLYPECTOMY  04/2015   INGUINAL HERNIA REPAIR Right 1982   IR RADIOLOGIST EVAL & MGMT  01/16/2017   KNEE ARTHROSCOPY W/ MENISCECTOMY Right 1998   LAPAROSCOPIC RIGHT COLECTOMY N/A 06/14/2016   Procedure: LAPAROSCOPIC HAND ASSISTED HEMICOLECTOMY AND SMALL BOWEL RESECTION.;  Surgeon: [name], MD;  Location: [place];  Service: General;  Laterality: N/A;   LIVER BIOPSY Right 06/14/2016   Procedure: LIVER BIOPSY;  Surgeon: Dorris Fetch  Barry Dienes, MD;  Location: [place];  Service: General;  Laterality: Right;  Right Inferior Liver   PORTACATH PLACEMENT N/A 07/06/2016   Procedure: INSERTION PORT-A-CATH;  Surgeon: [name], MD;  Location: [place];  Service: General;  Laterality: N/A;   VASECTOMY  2005    I have reviewed the social history and family history with the patient and they are unchanged from previous note.  ALLERGIES:  is allergic to penicillins; xeloda [capecitabine]; and nivolumab.  MEDICATIONS:  Current Outpatient Medications  Medication Sig Dispense Refill   albuterol (VENTOLIN HFA) 108 (90 Base) MCG/ACT inhaler Inhale 1-2 puffs into the lungs every 6 (six) hours as needed for wheezing or shortness of breath. 1 Inhaler 2    amLODipine (NORVASC) 10 MG tablet Take 1 tablet (10 mg total) by mouth daily. 30 tablet 5   fentaNYL (DURAGESIC) 25 MCG/HR Place 1 patch onto the skin every other day. 15 patch 0   gabapentin (NEURONTIN) 300 MG capsule Take 1 capsule (300 mg total) by mouth 3 (three) times daily. 90 capsule 1   HYDROmorphone (DILAUDID) 2 MG tablet Take 1-2 tablets (2-4 mg total) by mouth every 3 (three) hours as needed for severe pain. 120 tablet 0   LORazepam (ATIVAN) 1 MG tablet Take 1 tablet (1 mg total) by mouth every 4 (four) hours as needed for anxiety or sleep (nausea). 30 tablet 0   losartan (COZAAR) 100 MG tablet Take 1 tablet (100 mg total) by mouth daily. 30 tablet 0   Multiple Vitamin (MULTIVITAMIN WITH MINERALS) TABS tablet Take 1 tablet by mouth daily.     omeprazole (PRILOSEC) 20 MG capsule TAKE ONE CAPSULE BY MOUTH TWICE DAILY BEFORE A MEAL (Patient taking differently: Take 20 mg by mouth daily. ) 60 capsule 2   polyethylene glycol (MIRALAX / GLYCOLAX) 17 g packet Take 17 g by mouth daily. 14 each 0   prochlorperazine (COMPAZINE) 10 MG tablet Take 1 tablet (10 mg total) by mouth every 6 (six) hours as needed for nausea or vomiting. 30 tablet 3   regorafenib (STIVARGA) 40 MG tablet Take 2 tablets (80 mg total) by mouth daily. Take for 3 weeks on, 1 week off, repeat every 4 weeks. Take with a low fat and low calorie meal 42 tablet 2   senna-docusate (SENOKOT-S) 8.6-50 MG tablet Take 2 tablets by mouth 2 (two) times daily.     sildenafil (REVATIO) 20 MG tablet Take 60-100 mg by mouth See admin instructions. Take 60-100 mg by mouth at least 30 minutes prior to sexual activity     simethicone (MYLICON) 80 MG chewable tablet Chew 1 tablet (80 mg total) by mouth 4 (four) times daily as needed for flatulence. 30 tablet 0   venlafaxine XR (EFFEXOR-XR) 150 MG 24 hr capsule Take 1 capsule (150 mg total) by mouth daily with breakfast.     cyclobenzaprine (FLEXERIL) 5 MG tablet Take 1 tablet (5 mg  total) by mouth 3 (three) times daily as needed for muscle spasms. 30 tablet 0   dronabinol (MARINOL) 2.5 MG capsule Take 1 capsule (2.5 mg total) by mouth 2 (two) times daily before a meal. 30 capsule 1   Oxycodone HCl 10 MG TABS Take 1-2 tablets (10-20 mg total) by mouth every 6 (six) hours as needed. 100 tablet 0   No current facility-administered medications for this visit.    Facility-Administered Medications Ordered in Other Visits  Medication Dose Route Frequency Provider Last Rate Last Dose   sodium chloride  flush (NS) 0.9 % injection 10 mL  10 mL Intracatheter PRN Truitt Merle, MD   10 mL at 10/02/18 1133    PHYSICAL EXAMINATION: ECOG PERFORMANCE STATUS: 2 - Symptomatic, <50% confined to bed  Vitals:   10/02/18 0910  BP: (!) 126/91  Pulse: (!) 109  Resp: 18  Temp: 98 F (36.7 C)  SpO2: 98%   Filed Weights   10/02/18 0910  Weight: 181 lb (82.1 kg)    GENERAL:alert, no distress and comfortable SKIN: skin color, texture, turgor are normal, no rashes or significant lesions EYES: normal, Conjunctiva are pink and non-injected, sclera clear OROPHARYNX:no exudate, no erythema and lips, buccal mucosa, and tongue normal  NECK: supple, thyroid normal size, non-tender, without nodularity LYMPH:  no palpable lymphadenopathy in the cervical, axillary or inguinal LUNGS: clear to auscultation and percussion with normal breathing effort HEART: regular rate & rhythm and no murmurs and no lower extremity edema ABDOMEN:abdomen soft, non-tender and normal bowel sounds Musculoskeletal:no cyanosis of digits and no clubbing  NEURO: alert & oriented x 3 with fluent speech, no focal motor/sensory deficits  LABORATORY DATA:  I have reviewed the data as listed CBC Latest Ref Rng & Units 10/02/2018 09/18/2018 09/04/2018  WBC 4.0 - 10.5 K/uL 8.3 5.9 6.8  Hemoglobin 13.0 - 17.0 g/dL 12.0(L) 12.5(L) 15.0  Hematocrit 39.0 - 52.0 % 37.0(L) 37.9(L) 46.2  Platelets 150 - 400 K/uL 232 179 233      CMP Latest Ref Rng & Units 10/02/2018 09/18/2018 09/04/2018  Glucose 70 - 99 mg/dL 99 85 93  BUN 6 - 20 mg/dL '13 13 12  '$ Creatinine 0.61 - 1.24 mg/dL 1.00 0.91 1.13  Sodium 135 - 145 mmol/L 139 140 138  Potassium 3.5 - 5.1 mmol/L 4.2 4.2 4.2  Chloride 98 - 111 mmol/L 105 106 99  CO2 22 - 32 mmol/L '25 24 27  '$ Calcium 8.9 - 10.3 mg/dL 9.0 8.9 10.0  Total Protein 6.5 - 8.1 g/dL 6.8 7.4 8.2(H)  Total Bilirubin 0.3 - 1.2 mg/dL 0.6 0.7 1.2  Alkaline Phos 38 - 126 U/L 98 89 101  AST 15 - 41 U/L 47(H) 31 57(H)  ALT 0 - 44 U/L 77(H) 24 98(H)      RADIOGRAPHIC STUDIES: I have personally reviewed the radiological images as listed and agreed with the findings in the report. No results found.   ASSESSMENT & PLAN:  Yeshua Stryker is a 58 y.o. male with   1. Right colon cancer with liver, node and lung metastasis, pT4bN2bM1, stage IV, MSI-stable, KRAS G12D mutation (+) -He was diagnosed in 04/2015. He is s/p small bowel resection, adjuvant mFOLFOX which was switched to FOLFIRINOX and then dose reduced to FOLFIRI. -He unfortunately he progressed on maintenance 5-FU, FOLFIRI and FOLFOX.  -Hestarted3rd line Xeloda andAvastinin 03/2018 but after 1 cycleand 3 days treated [facility]stopped [id number]'100mg'$  3 weeks on/1 week off andbinimetinib 15 mgBID starting 1/10/20withDr. Truman Hayward -Unfortunatelyhe progressed on [facility] and was [facility] from [facility] care. -He has developed worsening abdominal pain.CTChest/APfrom 08/2018 was negative for SBO but showedprogressivemetastatic cancer in colon, peritoneal, mesenteryand new right sided pleural effusionand new pulmonary nodules. -He proceeded with 4th line therapy with Ragorafinib and Nivo based on the REGONIVO clinical trialdata(JCO 37(15), 2019).He started on 09/04/18. He is tolerating first cycle well overall.  -He continues to have drop in appetite and weight, but his abdominal  pain is controlled with pain meds and constipation improving. He is willing to start decreased narcotic use.  He also has recent pain over kidneys with change in urine stream, will obtain a UA today -Labs reviewed, urine protein, CBC WNL except mild anemia, CMP WNL except slightly elevated liver enzymes. Iron panel and CEA are still pending. Overall adequate to proceed with Nivo and pre-meds.  -Unfortunately his Nivo replacement was denied by [employer] due to his income. We cancelled his nivo infusion today.  -will continue Stivarga, will increase to '120mg'$  next week, and '160mg'$  one week after if he tolerates  -plan to repeat CT scan in 3-4 weeks, if he has good response, will appear insurance approval for Nivo again   2. Upper abdominal pain  -For the lastfewweeks he has had constant upper abdominal pain radiating across base of ribcage with occasion cramping (4-8/10).  -Iagainadvised him to avoid alcohol consumption as this can further effect his liver and ability to continue treatment. He is willing to reduce his alcohol intake. -CT AP from 08/29/18 showed worsening metastatic colon malignancy and mesenteric and peritoneal nodularity. -His worsening pain is related to his disease progression exacerbated by his anxiety and depression.He wasrecently hospitalized forpain management.  -I previously increased his Gabapentin to '300mg'$  at night to help him sleep. (09/04/18) -He still has constant right flank pain, midabdominal pain, and right lower chest pain with deep breathing.  -He can use heating pad over right flank incase this is muscular related.  -Starting 09/18/18, will continue Fentanyl 30mg/hr, but change it every 48 hours. Continue Oxycodone '20mg'$  2-3 times during the day and Dilauded '6mg'$  at night and Gabapentin TID. -Pain is now manageable. He will reduce to '10mg'$  oxycodone for non-significant pain during the day.  -For his new b/l lower back pain I recommend he use flexeril. Will monitor.     3. Depression, Anxiety, Insomnia -Pt has long-standing history of depression, has been on Prozac for decades, it was very well controlled in the past.  -He has good family and social support. -He has become more depressedagainwith disease progressionand stress of prognosis.  -I previously offered chance to speak [place]. He declined for now.  -He denies suicidal or self harm thought or ideology. -Iagainstrongly encouraged him to f/u with his Psychiatrist, he is agreeable. -He gets interrupted sleep for 7.5 hours. Currently onEffexor and Ativan. He has been taking Gabapentin TID.  -Stable mood    4. Hypertension  -OnAmlodipineand Lisinopril, I refilled today -He does not want to follow-up with his primary care physician. -Will monitor as Ragorafinib may increase his blood pressure. -BP at126/91today (10/02/18),stable. I encouraged him to monitor at home.  5. Anemia of iron deficiency and chemo  -He has mild anemia, likely related to his colon cancer bleeding.  -Continueoral iron supplements  -Hg at 12 today (10/02/18), iron panel still pending.   6. Constipation  -likely secondary to pain medication -He only had 1 BM every 4 days. No hard stool -Continue Miralax or Senakot daily. If he needs to he can take an enema. He can take Milk of magnesium or magnesium citrate for severe constipation.  -He will reduce pain meds for mild to moderate pain.   7. Anorexia and weight loss  -Secondary to underlying malignancy -He continues to have weight loss especially with worsened abdominal pain and disease progression.  -His appetite and weight continues to drop, I strongly encouraged him to increase supplement, increase snacks and protein and calorie in diet to maintain weight.  -I encourage him to follow up with DWarnell Forester -He  Marinol and continue ativan.I refilled Marinol today (10/02/18)  8. Goal of care discussion, on  DNR/DNI -The patient understands the goal of care is palliative.  -Henotes he hasupdatedhis will to include a DNR.   9. Right knee arthritis  -He was offered total knee replacement by orthopedist but declined due to poor prognosis.  -He will continue to get injections in knee. Pain improved.     Plan -I refilled Flexeril, Marinol, Oxycodone at '10mg'$ .  -cancel Nivo infusion due to lack of coverage  -start second cycle Ragorafinib today, '80mg'$  daily for first week, '120mg'$  daily for second week and '160mg'$  daily for third week, if he tolerates .  -lab and f/u in 2 weeks, will order restaging scan on next visit  -I spoke with pt's wife Nicholas Lowery today after his visit    No problem-specific Assessment & Plan notes found for this encounter.   Orders Placed This Encounter  Procedures   Urine Culture    Standing Status:   Future    Standing Expiration Date:   10/02/2019   Urinalysis, Complete w Microscopic    Standing Status:   Future    Standing Expiration Date:   10/02/2019   All questions were answered. The patient knows to call the clinic with any problems, questions or concerns. No barriers to learning was detected. I spent 30 minutes counseling the patient face to face. The total time spent in the appointment was 40 minutes and more than 50% was on counseling and review of test results     Truitt Merle, MD 10/02/2018   I, Joslyn Devon, am acting as scribe for Truitt Merle, MD.   I have reviewed the above documentation for accuracy and completeness, and I agree with the above.

## 2018-10-01 ENCOUNTER — Other Ambulatory Visit: Payer: Self-pay | Admitting: Hematology

## 2018-10-01 DIAGNOSIS — C787 Secondary malignant neoplasm of liver and intrahepatic bile duct: Secondary | ICD-10-CM

## 2018-10-01 DIAGNOSIS — F419 Anxiety disorder, unspecified: Secondary | ICD-10-CM | POA: Insufficient documentation

## 2018-10-01 DIAGNOSIS — C189 Malignant neoplasm of colon, unspecified: Secondary | ICD-10-CM

## 2018-10-01 DIAGNOSIS — F329 Major depressive disorder, single episode, unspecified: Secondary | ICD-10-CM | POA: Insufficient documentation

## 2018-10-02 ENCOUNTER — Inpatient Hospital Stay: Payer: BLUE CROSS/BLUE SHIELD

## 2018-10-02 ENCOUNTER — Telehealth: Payer: Self-pay | Admitting: Hematology

## 2018-10-02 ENCOUNTER — Telehealth: Payer: Self-pay

## 2018-10-02 ENCOUNTER — Encounter: Payer: Self-pay | Admitting: Hematology

## 2018-10-02 ENCOUNTER — Other Ambulatory Visit: Payer: Self-pay

## 2018-10-02 ENCOUNTER — Inpatient Hospital Stay (HOSPITAL_BASED_OUTPATIENT_CLINIC_OR_DEPARTMENT_OTHER): Payer: BLUE CROSS/BLUE SHIELD | Admitting: Hematology

## 2018-10-02 VITALS — BP 126/91 | HR 109 | Temp 98.0°F | Resp 18 | Ht 70.0 in | Wt 181.0 lb

## 2018-10-02 VITALS — BP 121/83 | HR 100

## 2018-10-02 DIAGNOSIS — C7802 Secondary malignant neoplasm of left lung: Secondary | ICD-10-CM | POA: Diagnosis not present

## 2018-10-02 DIAGNOSIS — G629 Polyneuropathy, unspecified: Secondary | ICD-10-CM

## 2018-10-02 DIAGNOSIS — R1312 Dysphagia, oropharyngeal phase: Secondary | ICD-10-CM

## 2018-10-02 DIAGNOSIS — Z79899 Other long term (current) drug therapy: Secondary | ICD-10-CM

## 2018-10-02 DIAGNOSIS — R630 Anorexia: Secondary | ICD-10-CM

## 2018-10-02 DIAGNOSIS — C787 Secondary malignant neoplasm of liver and intrahepatic bile duct: Secondary | ICD-10-CM

## 2018-10-02 DIAGNOSIS — C182 Malignant neoplasm of ascending colon: Secondary | ICD-10-CM

## 2018-10-02 DIAGNOSIS — G4700 Insomnia, unspecified: Secondary | ICD-10-CM

## 2018-10-02 DIAGNOSIS — Z87891 Personal history of nicotine dependence: Secondary | ICD-10-CM

## 2018-10-02 DIAGNOSIS — C189 Malignant neoplasm of colon, unspecified: Secondary | ICD-10-CM

## 2018-10-02 DIAGNOSIS — F329 Major depressive disorder, single episode, unspecified: Secondary | ICD-10-CM

## 2018-10-02 DIAGNOSIS — I10 Essential (primary) hypertension: Secondary | ICD-10-CM

## 2018-10-02 DIAGNOSIS — M545 Low back pain: Secondary | ICD-10-CM

## 2018-10-02 DIAGNOSIS — C7801 Secondary malignant neoplasm of right lung: Secondary | ICD-10-CM | POA: Diagnosis not present

## 2018-10-02 DIAGNOSIS — R300 Dysuria: Secondary | ICD-10-CM

## 2018-10-02 DIAGNOSIS — D509 Iron deficiency anemia, unspecified: Secondary | ICD-10-CM

## 2018-10-02 DIAGNOSIS — Z95828 Presence of other vascular implants and grafts: Secondary | ICD-10-CM

## 2018-10-02 DIAGNOSIS — G893 Neoplasm related pain (acute) (chronic): Secondary | ICD-10-CM

## 2018-10-02 DIAGNOSIS — R634 Abnormal weight loss: Secondary | ICD-10-CM

## 2018-10-02 DIAGNOSIS — F419 Anxiety disorder, unspecified: Secondary | ICD-10-CM

## 2018-10-02 DIAGNOSIS — D500 Iron deficiency anemia secondary to blood loss (chronic): Secondary | ICD-10-CM

## 2018-10-02 LAB — CMP (CANCER CENTER ONLY)
ALT: 77 U/L — ABNORMAL HIGH (ref 0–44)
AST: 47 U/L — ABNORMAL HIGH (ref 15–41)
Albumin: 3.6 g/dL (ref 3.5–5.0)
Alkaline Phosphatase: 98 U/L (ref 38–126)
Anion gap: 9 (ref 5–15)
BUN: 13 mg/dL (ref 6–20)
CO2: 25 mmol/L (ref 22–32)
Calcium: 9 mg/dL (ref 8.9–10.3)
Chloride: 105 mmol/L (ref 98–111)
Creatinine: 1 mg/dL (ref 0.61–1.24)
GFR, Est AFR Am: >60 mL/min (ref >60)
GFR, Estimated: >60 mL/min (ref >60)
Glucose, Bld: 99 mg/dL (ref 70–99)
Potassium: 4.2 mmol/L (ref 3.5–5.1)
Sodium: 139 mmol/L (ref 135–145)
Total Bilirubin: 0.6 mg/dL (ref 0.3–1.2)
Total Protein: 6.8 g/dL (ref 6.5–8.1)

## 2018-10-02 LAB — CBC WITH DIFFERENTIAL/PLATELET
Abs Immature Granulocytes: 0.01 10*3/uL (ref 0.00–0.07)
Basophils Absolute: 0.1 10*3/uL (ref 0.0–0.1)
Basophils Relative: 1 %
Eosinophils Absolute: 0.4 10*3/uL (ref 0.0–0.5)
Eosinophils Relative: 5 %
HCT: 37 % — ABNORMAL LOW (ref 39.0–52.0)
Hemoglobin: 12 g/dL — ABNORMAL LOW (ref 13.0–17.0)
Immature Granulocytes: 0 %
Lymphocytes Relative: 9 %
Lymphs Abs: 0.7 10*3/uL (ref 0.7–4.0)
MCH: 31.1 pg (ref 26.0–34.0)
MCHC: 32.4 g/dL (ref 30.0–36.0)
MCV: 95.9 fL (ref 80.0–100.0)
Monocytes Absolute: 0.9 10*3/uL (ref 0.1–1.0)
Monocytes Relative: 10 %
Neutro Abs: 6.2 10*3/uL (ref 1.7–7.7)
Neutrophils Relative %: 75 %
Platelets: 232 10*3/uL (ref 150–400)
RBC: 3.86 MIL/uL — ABNORMAL LOW (ref 4.22–5.81)
RDW: 14.3 % (ref 11.5–15.5)
WBC: 8.3 10*3/uL (ref 4.0–10.5)
nRBC: 0 % (ref 0.0–0.2)

## 2018-10-02 LAB — TOTAL PROTEIN, URINE DIPSTICK: Protein, ur: NEGATIVE mg/dL

## 2018-10-02 LAB — CEA (IN HOUSE-CHCC): CEA (CHCC-In House): 49.85 ng/mL — ABNORMAL HIGH (ref 0.00–5.00)

## 2018-10-02 LAB — IRON AND TIBC
Iron: 39 ug/dL — ABNORMAL LOW (ref 42–163)
Saturation Ratios: 16 % — ABNORMAL LOW (ref 20–55)
TIBC: 243 ug/dL (ref 202–409)
UIBC: 204 ug/dL (ref 117–376)

## 2018-10-02 LAB — FERRITIN: Ferritin: 147 ng/mL (ref 24–336)

## 2018-10-02 MED ORDER — OXYCODONE HCL 10 MG PO TABS: 10.0000 mg | ORAL_TABLET | Freq: Four times a day (QID) | ORAL | 0 refills | Status: DC | PRN

## 2018-10-02 MED ORDER — DIPHENHYDRAMINE HCL 50 MG/ML IJ SOLN
INTRAMUSCULAR | Status: AC
  Filled 2018-10-02: qty 1

## 2018-10-02 MED ORDER — SODIUM CHLORIDE 0.9% FLUSH
10.0000 mL | Freq: Once | INTRAVENOUS | Status: AC
  Administered 2018-10-02: 10 mL
  Filled 2018-10-02: qty 10

## 2018-10-02 MED ORDER — FAMOTIDINE IN NACL 20-0.9 MG/50ML-% IV SOLN: 20.0000 mg | Freq: Once | INTRAVENOUS | Status: DC

## 2018-10-02 MED ORDER — ONDANSETRON HCL 4 MG/2ML IJ SOLN: 8.0000 mg | Freq: Once | INTRAMUSCULAR | Status: DC

## 2018-10-02 MED ORDER — HEPARIN SOD (PORK) LOCK FLUSH 100 UNIT/ML IV SOLN
500.0000 [IU] | Freq: Once | INTRAVENOUS | Status: AC | PRN
  Administered 2018-10-02: 12:00:00 500 [IU]
  Filled 2018-10-02: qty 5

## 2018-10-02 MED ORDER — SODIUM CHLORIDE 0.9 % IV SOLN: 3.0000 mg/kg | Freq: Once | INTRAVENOUS | Status: DC

## 2018-10-02 MED ORDER — SODIUM CHLORIDE 0.9% FLUSH
10.0000 mL | INTRAVENOUS | Status: DC | PRN
  Administered 2018-10-02: 12:00:00 10 mL
  Filled 2018-10-02: qty 10

## 2018-10-02 MED ORDER — ACETAMINOPHEN 325 MG PO TABS
ORAL_TABLET | ORAL | Status: AC
  Filled 2018-10-02: qty 2

## 2018-10-02 MED ORDER — DIPHENHYDRAMINE HCL 50 MG/ML IJ SOLN: 25.0000 mg | Freq: Once | INTRAMUSCULAR | Status: DC

## 2018-10-02 MED ORDER — DRONABINOL 2.5 MG PO CAPS: 2.5000 mg | ORAL_CAPSULE | Freq: Two times a day (BID) | ORAL | 1 refills | Status: DC

## 2018-10-02 MED ORDER — ACETAMINOPHEN 325 MG PO TABS: 650.0000 mg | ORAL_TABLET | Freq: Once | ORAL | Status: DC

## 2018-10-02 MED ORDER — SODIUM CHLORIDE 0.9 % IV SOLN
Freq: Once | INTRAVENOUS | Status: AC
  Administered 2018-10-02: 10:00:00 via INTRAVENOUS
  Filled 2018-10-02: qty 250

## 2018-10-02 MED ORDER — CYCLOBENZAPRINE HCL 5 MG PO TABS: 5.0000 mg | ORAL_TABLET | Freq: Three times a day (TID) | ORAL | 0 refills | Status: DC | PRN

## 2018-10-02 MED ORDER — FAMOTIDINE IN NACL 20-0.9 MG/50ML-% IV SOLN
INTRAVENOUS | Status: AC
  Filled 2018-10-02: qty 50

## 2018-10-02 NOTE — Telephone Encounter (Signed)
Nutrition Follow-up:  RD working remotely.  Called patient at the request of [name] due to weight loss.    Patient last seen by RD July 2018.   Patient reports that appetite has been decreased due to narcotics and pain.  Reports that food does not excite him anymore.  Reports that has 3 kids (39, 68 and 41) and wife does best to prepare foods for everybody.  Does not want to burden wife to prepare something different for him.  Reports that he typically drinks 2 carnation breakfast essential drinks mixed with 2% milk for breakfast, skips lunch and dinner is whatever he thinks he can eat or nothing.  Reports today going to [place].  Usually eats 1 good meal per day.    Reports that he understands that his cancer has progressed and treatment is now palliative.  Reports today has not been a good day.  Tearful at times during conversation.  Reports issues with depression.  Denies harming himself.  Reports that he has spoken with psychiatrist in the past.    Medications: marinol added today  Labs: reviewed  Anthropometrics:   Weight 181 lb today 7 lb decrease in the last 2 weeks.  Reports has not been this weight since high school   4% weight loss in the last 2 weeks.    Estimated Energy Needs  Kcals: 5859-2924 calories Protein: 123-140 g Fluid: > 2.4 L  NUTRITION DIAGNOSIS: Inadequate oral intake related to cancer, narcotics, abdominal pain, likely depression as well as evidenced by 4% weight loss in 2 weeks.   INTERVENTION:  Offered Clinical Social Work to call and patient declined. Encouraged trying appetite stimulant, marinol Encouraged small frequent meals Discussed briefly ways to add calories and protein but patient not in good place today.    MONITORING, EVALUATION, GOAL: Patient will increase calories and protein to maintain weight   NEXT VISIT: agreeable to phone follow-up June 16th   [place]anequa Kretz B. Zenia Resides, [place], [place] Registered Dietitian [phone]  (pager)

## 2018-10-02 NOTE — Patient Instructions (Signed)
[facility] Discharge Instructions for Patients Receiving Chemotherapy  Today you received the following chemotherapy agents: Opdivo  To help prevent nausea and vomiting after your treatment, we encourage you to take your nausea medication as directed.   If you develop nausea and vomiting that is not controlled by your nausea medication, call the clinic.   BELOW ARE SYMPTOMS THAT SHOULD BE REPORTED IMMEDIATELY:  *FEVER GREATER THAN 100.5 F  *CHILLS WITH OR WITHOUT FEVER  NAUSEA AND VOMITING THAT IS NOT CONTROLLED WITH YOUR NAUSEA MEDICATION  *UNUSUAL SHORTNESS OF BREATH  *UNUSUAL BRUISING OR BLEEDING  TENDERNESS IN MOUTH AND THROAT WITH OR WITHOUT PRESENCE OF ULCERS  *URINARY PROBLEMS  *BOWEL PROBLEMS  UNUSUAL RASH Items with * indicate a potential emergency and should be followed up as soon as possible.  Feel free to call the clinic should you have any questions or concerns. The clinic phone number is [phone].  Please show the [place] at check-in to the Emergency Department and triage nurse.  Nivolumab injection What is this medicine? NIVOLUMAB (nye VOL ue mab) is a monoclonal antibody. It is used to treat melanoma, lung cancer, kidney cancer, head and neck cancer, Hodgkin lymphoma, urothelial cancer, colon cancer, and liver cancer. This medicine may be used for other purposes; ask your health care provider or pharmacist if you have questions. COMMON BRAND NAME(S): Opdivo What should I tell my health care provider before I take this medicine? They need to know if you have any of these conditions: -diabetes -immune system problems -kidney disease -liver disease -lung disease -organ transplant -stomach or intestine problems -thyroid disease -an unusual or allergic reaction to nivolumab, other medicines, foods, dyes, or preservatives -pregnant or trying to get pregnant -breast-feeding How should I use this medicine? This medicine is for  infusion into a vein. It is given by a health care professional in a hospital or clinic setting. A special MedGuide will be given to you before each treatment. Be sure to read this information carefully each time. Talk to your pediatrician regarding the use of this medicine in children. While this drug may be prescribed for children as young as 12 years for selected conditions, precautions do apply. Overdosage: If you think you have taken too much of this medicine contact a poison control center or emergency room at once. NOTE: This medicine is only for you. Do not share this medicine with others. What if I miss a dose? It is important not to miss your dose. Call your doctor or health care professional if you are unable to keep an appointment. What may interact with this medicine? Interactions have not been studied. Give your health care provider a list of all the medicines, herbs, non-prescription drugs, or dietary supplements you use. Also tell them if you smoke, drink alcohol, or use illegal drugs. Some items may interact with your medicine. This list may not describe all possible interactions. Give your health care provider a list of all the medicines, herbs, non-prescription drugs, or dietary supplements you use. Also tell them if you smoke, drink alcohol, or use illegal drugs. Some items may interact with your medicine. What should I watch for while using this medicine? This drug may make you feel generally unwell. Continue your course of treatment even though you feel ill unless your doctor tells you to stop. You may need blood work done while you are taking this medicine. Do not become pregnant while taking this medicine or for 5 months after stopping  it. Women should inform their doctor if they wish to become pregnant or think they might be pregnant. There is a potential for serious side effects to an unborn child. Talk to your health care professional or pharmacist for more information. Do  not breast-feed an infant while taking this medicine or for 5 months after stopping it. What side effects may I notice from receiving this medicine? Side effects that you should report to your doctor or health care professional as soon as possible: -allergic reactions like skin rash, itching or hives, swelling of the face, lips, or tongue -breathing problems -blood in the urine -bloody or watery diarrhea or black, tarry stools -changes in emotions or moods -changes in vision -chest pain -cough -dizziness -feeling faint or lightheaded, falls -fever, chills -headache with fever, neck stiffness, confusion, loss of memory, sensitivity to light, hallucination, loss of contact with reality, or seizures -joint pain -mouth sores -redness, blistering, peeling or loosening of the skin, including inside the mouth -severe muscle pain or weakness -signs and symptoms of high blood sugar such as dizziness; dry mouth; dry skin; fruity breath; nausea; stomach pain; increased hunger or thirst; increased urination -signs and symptoms of kidney injury like trouble passing urine or change in the amount of urine -signs and symptoms of liver injury like dark yellow or brown urine; general ill feeling or flu-like symptoms; light-colored stools; loss of appetite; nausea; right upper belly pain; unusually weak or tired; yellowing of the eyes or skin -swelling of the ankles, feet, hands -trouble passing urine or change in the amount of urine -unusually weak or tired -weight gain or loss Side effects that usually do not require medical attention (report to your doctor or health care professional if they continue or are bothersome): -bone pain -constipation -decreased appetite -diarrhea -muscle pain -nausea, vomiting -tiredness This list may not describe all possible side effects. Call your doctor for medical advice about side effects. You may report side effects to FDA at 1-800-FDA-1088. Where should I keep  my medicine? This drug is given in a hospital or clinic and will not be stored at home. NOTE: This sheet is a summary. It may not cover all possible information. If you have questions about this medicine, talk to your doctor, pharmacist, or health care provider.  2019 Elsevier/Gold Standard (2017-09-19 12:55:04)

## 2018-10-02 NOTE — Telephone Encounter (Signed)
Per 5/20 los, appt already scheduled.

## 2018-10-08 ENCOUNTER — Encounter: Payer: Self-pay | Admitting: Hematology

## 2018-10-08 ENCOUNTER — Other Ambulatory Visit: Payer: Self-pay | Admitting: Hematology

## 2018-10-09 ENCOUNTER — Other Ambulatory Visit: Payer: Self-pay | Admitting: Nurse Practitioner

## 2018-10-09 ENCOUNTER — Encounter: Payer: Self-pay | Admitting: Nurse Practitioner

## 2018-10-09 ENCOUNTER — Telehealth: Payer: Self-pay | Admitting: Nurse Practitioner

## 2018-10-09 MED ORDER — HYDROMORPHONE HCL 2 MG PO TABS: 2.0000 mg | ORAL_TABLET | ORAL | 0 refills | Status: DC | PRN

## 2018-10-09 NOTE — Telephone Encounter (Signed)
Rescheduled appts per 5/27 staff message, with patient wanting appts on Friday if possible.  Patient aware of new appt date and time,

## 2018-10-14 ENCOUNTER — Other Ambulatory Visit: Payer: Self-pay | Admitting: Hematology

## 2018-10-14 ENCOUNTER — Encounter: Payer: Self-pay | Admitting: Hematology

## 2018-10-14 DIAGNOSIS — I10 Essential (primary) hypertension: Secondary | ICD-10-CM

## 2018-10-14 MED ORDER — LOSARTAN POTASSIUM 100 MG PO TABS: 100.0000 mg | ORAL_TABLET | Freq: Every day | ORAL | 0 refills | Status: DC

## 2018-10-15 ENCOUNTER — Telehealth: Payer: Self-pay

## 2018-10-15 NOTE — Telephone Encounter (Signed)
Faxed script for Marinol to [facility], sent to HIM for scan to chart.

## 2018-10-16 ENCOUNTER — Other Ambulatory Visit: Payer: Self-pay

## 2018-10-16 ENCOUNTER — Ambulatory Visit: Payer: BLUE CROSS/BLUE SHIELD

## 2018-10-16 ENCOUNTER — Ambulatory Visit: Payer: Self-pay | Admitting: Hematology

## 2018-10-16 NOTE — Progress Notes (Signed)
[place]   [phone] [phone]   Clinic Follow up Note   Patient Care Team: Curlene Labrum, MD as PCP - General (Family Medicine)  Date of Service:  10/18/2018  CHIEF COMPLAINT: F/u of metastatic colon cancer  SUMMARY OF ONCOLOGIC HISTORY: Oncology History   Cancer Staging Metastatic colon cancer to liver [facility]) Staging form: Colon and Rectum, AJCC 8th Edition - Clinical stage from 06/01/2016: Stage IVA (cTX, cNX, pM1a) - Signed by Truitt Merle, MD on 07/04/2016 - Pathologic stage from 06/14/2016: Stage IVA (pT4b(m), pN2b, pM1a) - Signed by Truitt Merle, MD on 07/04/2016       Metastatic colon cancer to liver ([place])   04/2015 Procedure    Colonoscopy by Dr. Ladona Horns. It showed showed 2 sessile polyps ready between 3-5 mm in size located 20 cm (A, B) from the point of entry, polypectomy was performed. Pedunculated polyp was found in the ascending colon (C), polypectomy was performed, and additional polyp (D) was found 30 cm from the point of entry, removed    04/2015 Pathology Results    tubular adenoma (A and B), and well differentiated adenocarcinoma arising from tubulovillous adenoma (C) and well differentiated adenocarcinoma arising from severe dysplasia to intramucosal carcinoma within tubular adenoma.     04/2015 Initial Diagnosis    Metastatic colon cancer to liver ([place])    05/29/2016 Imaging    CT abdomen and pelvis with contrast showed an apple core like stricture in right colon just above the cecum, measuring 3.2 cm in lengths. This is highly suspicious for malignancy. Small lymph node a noticed he had adjacent mesentery, measuring 8 mm. There is a low-density lesion within the inferior aspect of the right hepatic lobe measuring 1.7 cm, suspicious for metastasis.     06/06/2016 Tumor Marker    CEA 9.99    06/08/2016 PET scan    IMPRESSION: Approximately 3 cm hypermetabolic mass in the ascending colon, consistent with primary colon carcinoma.  This mass results in colonic obstruction and small bowel dilatation. Additional areas of hypermetabolic wall thickening in the cecum may represent other sites of colon carcinoma or colitis. Mild hypermetabolic lymphadenopathy in right pericolonic region, porta hepatis, and aortocaval space, consistent with metastatic disease. Mild hypermetabolic mediastinal lymphadenopathy also seen, and thoracic lymph node metastases cannot be excluded. Solitary hypermetabolic focus in inferior right hepatic lobe, consistent with liver metastasis. Consider abdomen MRI without and with contrast for further evaluation.    06/14/2016 Surgery    Hand assisted right hemicolectomy and small bowel resection for colon cancer, liver biopsy, by Dr. Barry Dienes    06/14/2016 Pathology Results    Right hemicolectomy showed invasive well to moderately differentiated adenocarcinoma, 2 foci measuring 7.5 cm and 4.5 cm, tumor invades through full thickness of colon, to the seroma and involve the Small Bowel, Surgical Margins Were Negative, 24 Out Of 64 Lymph Nodes Were Positive, Extracapsular Extension Identified, Multiple Satellite Tumor Deposits Present, Liver Biopsy Showed Metastatic Adenocarcinoma.      06/14/2016 Miscellaneous    Tumor MMR normal, MSI stable     06/14/2016 Miscellaneous    Foundation one genomic testing showed K-ras G12 D mutation, APC and TP53 mutation. No BRAF and NRAS mutation. MSI-stable, tumor burden low.    07/06/2016 Tumor Marker    CEA 13.69    07/13/2016 - 01/18/2017 Chemotherapy    mFOLFOX, every 2 weeks, started on 07/14/2015, Avastin added from cycle 3  Oxaliplatin dose to 48m/m2 due to side effects and some  cytopenia on 09/21/16  Changed to FOLFIRINOX starting cycle 7 and Reduced  Due to neuropathy hold Oxaliplatin and add Irinotecan with neulasta on day 3 starting with cycle 7 Add low dose Oxaliplatin with cycle 8.  Due to his worsening neuropathy, and good response to chemotherapy, I previously  stopped oxaliplatin from cycle 11, and continue FOLFIRI and avastin       07/27/2016 Tumor Marker    CEA 15.85    09/04/2016 Imaging    Ct C/A/P W Contrast IMPRESSION: Interval right colectomy. Stable small liver metastasis in the inferior right hepatic lobe. Stable mild porta hepatis and aortocaval lymphadenopathy. Stable mild mediastinal lymphadenopathy. No new or progressive metastatic disease identified within the chest, abdomen, or pelvis.    12/19/2016 PET scan    IMPRESSION: 1. Right hemicolectomy, with resolution of the prior hypermetabolic activity inferiorly in the right hepatic lobe, in several mediastinal lymph nodes, and in lymph nodes in the retroperitoneum and porta hepatis. No residual hypermetabolic or enlarged lymph nodes are identified. 2. Low-grade diffuse skeletal metabolic activity is likely therapy related. 3. Coronary atherosclerosis. 4. 3 by 4 mm right middle lobe pulmonary nodule is stable, not appreciably hypermetabolic, but below sensitive PET-CT size thresholds. This may warrant surveillance.    01/11/2017 Imaging    MR Abdomen W WO Contrast IMPRESSION: 1. No acute findings within the abdomen. Previously noted liver metastasis has resolved in the interval. No new lesions.    02/01/2017 - 09/2017 Chemotherapy    Maintenance therapy, Xeloda 2055m (1000 mg/m2)  q12h on day 1-14 every 21 days plus AVASTIN, starting 02/01/2017.  stopped after 12 days due to poor tolerance on 02/14/17  Changed to maintenance 5-FU and avastin every 2 weeks starting on 02/22/17, stopped in 09/2017.       04/10/2017 Imaging    IMPRESSION: 1. Status post right hemicolectomy without findings for recurrent tumor. 2. No worrisome hepatic lesions. Treated disease with only a small residual low attenuation lesion in the right hepatic lobe. 3. No recurrent mediastinal or abdominal lymphadenopathy.     07/09/2017 PET scan    PET 07/09/17 IMPRESSION: 1. Status post right  hemicolectomy, without findings of hypermetabolic recurrent or metastatic disease. 2. A right middle lobe pulmonary nodule is unchanged. However, there is a right lower lobe 5 mm pulmonary nodule which is felt to be new and enlarged compared to prior exams. Suspicious for an isolated pulmonary metastasis. Consider CT follow-up at 3-6 months. 3. Age advanced coronary artery atherosclerosis. Recommend assessment of coronary risk factors and consideration of medical therapy. 4. Borderline ascending aortic dilatation, 4.0 cm      10/10/2017 Imaging    IMPRESSION: 1. Several (at least 10) subcentimeter pulmonary nodules scattered in both lungs, predominantly in the lower lobes, all new/increased, most compatible with enlarging pulmonary metastases, largest 8 mm in the right lower lobe. 2. No additional findings of new or progressive metastatic disease. No recurrent adenopathy. Stable small low-attenuation lesion in the inferior right liver lobe compatible with treated metastasis. No new liver metastases. 3. Stable ectatic 4.0 cm ascending thoracic aorta. Recommend annual imaging followup by CTA or MRA.  4. Stable mild splenomegaly.     10/11/2017 - 12/2017 Chemotherapy    FOLFIRI and Avastin every 2 weeks starting 10/11/17, irnotecan stopped in 12/2017 due to disease progression      01/08/2018 Imaging    01/08/2018 CT CAP IMPRESSION: 1. Progressive hepatic and pulmonary metastatic disease. 2. Ascending Aortic aneurysm NOS (ICD10-I71.9).    01/08/2018 Progression  01/08/2018 CT CAP IMPRESSION: 1. Progressive hepatic and pulmonary metastatic disease. 2. Ascending Aortic aneurysm NOS (ICD10-I71.9).    01/10/2018 - 03/07/2018 Chemotherapy    FOLFOX and Avastin every 2 weeks starting on 01/10/2018. Stopped due to disease progression     03/19/2018 Progression    03/19/2018 CT CAP IMPRESSION: 1. Interval increase in size of dominant nodule within the right lower lobe. There are a few  additional nodules which have increased in size. Multiple additional small bilateral pulmonary nodules are grossly similar. 2. Slight interval increase in size of lesion within the right hepatic lobe. 3. Interval increase in soft tissue at the surgical anastomosis involving the small bowel within the central abdomen. Locally recurrent disease not excluded.     04/07/2018 - 04/2018 Chemotherapy    Third lineXeloda 1592m BID 7 days on/7 days off and Avastin every 2 weeksstarting 04/07/18 stopped after 1 cycle and 4 days because he started Clinical trail.     05/22/2018 Imaging    [facility]   LINES AND TUBES: None.  LOWER THORAX: Please see same day CT chest for findings above the diaphragm..Marland Kitchen HEPATOBILIARY: Low-attenuation lesion in segment 5 has minimally increased in size from prior measuring 2.4 x 2.0 cm, previously 2.3 x 1.8 cm when remeasured (8:42). The gallbladder is present and otherwise unremarkable. No biliary dilatation.   SPLEEN: Unremarkable. PANCREAS: Unremarkable.  ADRENALS: Unremarkable. KIDNEYS/URETERS: Unremarkable.  BLADDER: Decompressed making further evaluation difficult. PELVIC/REPRODUCTIVE ORGANS: Unremarkable.  GI TRACT: No dilated or thick walled loops of bowel. Sequelae of right hemicolectomy and appendectomy.   PERITONEUM/RETROPERITONEUM AND MESENTERY: Minimally decreased spiculated mesenteric soft tissue density at the site of enteroenteric anastomosis, now measuring roughly 2.6 x 1.4 cm, previously 3.3 x 3.1 cm (8:54). There is also a mild amount of stranding near the anastomosis within the mid abdomen (8:54)   LYMPH NODES: Borderline enlarged 1.0 cm porta hepatic lymph node, previously 0.7 cm (8:26). VESSELS: The aorta is normal in caliber.  No significant calcified atherosclerotic disease. Though the contrast timing is suboptimal for evaluation of the portal venous system, the portal venous system is appears patent. The  hepatic veins and IVC are unremarkable.  BONES AND SOFT TISSUES: Small fat-containing ventral hernia..    05/24/2018 - 08/02/2018 Chemotherapy    [facility] [id number] with palbociclib (Ibrnace) 1047m3 weeks on/1 week off and binimetinib 15 mg BID starting 05/24/18. Stopped 08/02/18 due to disease progression.     08/02/2018 Imaging    [facility]  Since 05/22/2018:  -Unchanged to mildly decreased in size segment 6 hepatic lesion.  -Unchanged irregular soft tissue density at the enteroenteric anastomosis.  -Increase in size of an irregular soft tissue density at the enterocolic anastomosis. This is new since 03/19/2018.  -Mild decrease in size of porta hepatis lymph node.  IMPRESSION:  Slight interval increase in multiple metastatic pulmonary nodules.  New likely malignant small right pleural effusion. Interval prominent right infrahilar lymph nodes; metastasis is in the differential. Mild interlobular septal thickening in the right middle and lower lobes is concerning for lymphangitic tumor spread. Attention on follow-up is suggested.      08/29/2018 Imaging    CT AP 08/29/18 IMPRESSION: 1. No acute CT findings of the abdomen or pelvis to explain abdominal pain.  2. Evidence of worsening metastatic colon malignancy status post right colon resection, including increasing mesenteric nodularity, increasing peritoneal nodularity, enlarging liver metastasis, and new and enlarging pulmonary nodules.  3. Trace perihepatic ascites, of uncertain etiology, although presumably malignant given peritoneal metastatic disease.    09/02/2018 Imaging    CT chest 09/02/18  IMPRESSION: 1. Unfortunately there is interval increase in size of bilateral pulmonary nodules consistent with progression of pulmonary metastasis. 2. New small moderate RIGHT pleural effusion. 3. Interval increase in size of small mediastinal lymph nodes most consistent with metastatic  adenopathy. 4. Interval increase in size of hepatic metastasis in the inferior RIGHT hepatic lobe compared to CT 03/19/2018.    09/04/2018 -  Chemotherapy    oral Ragorafinib 30m daily 3 weeks on, 1 week off every 28 days as tolerated and IV Nivolumab 320mkg q2weeks starting 09/04/18      CURRENT THERAPY:  oral Regorafenib8025mily3 weeks on, 1 week offand IV Nivolumab 3mg104mq2weeks starting 09/04/18. Nivo d/c after cycle 2 due to lack of coverage.   INTERVAL HISTORY:  ChriPerle Gibbonhere for a follow up. He presents to the clinic alone. He called his wife to be included in the visit. He notes he was not able to give a urine sample earlier. He notes he upper abdominal and midline pain is stable. His notes his positional pain has dissipated and not returned. He takes 2-3 oxycodone 20mg80may and 1 dilaudid at night. He changes his Fentanyl patch every 2 days. He notes this regimen makes pain manageable. He feels he does not need dilaudid and may only take the oxycodone. He notes he will drink beer once every few days. He notes he is sleeping better at night now.  He notes his baseline weight is 194. He continues to not eat as much. He has lost 10 pounds in the last month. He has to make himself to eat. He has only taken Marinol 2-3 times.  He feels like he is having memory decline in his day to day. He feels concerns if this progresses and affects his work and then his financial life.  He notes his fatigue has worsened. He notes his Bm every 2-3 days. He use to go daily. He has been taken Miralax every 1-2 days to avoid diarrhea  He has been taking Regorafenib 120mg 9my on 10/16/18 for the 3rd week and will be off next week, before cycle 3.    REVIEW OF SYSTEMS:   Constitutional: Denies fevers, chills or abnormal weight loss (+) worsened fatigue (+) weight loss  Eyes: Denies blurriness of vision Ears, nose, mouth, throat, and face: Denies mucositis or sore throat Respiratory:  Denies cough, dyspnea or wheezes Cardiovascular: Denies palpitation, chest discomfort or lower extremity swelling Gastrointestinal:  Denies nausea, heartburn (+0 BM every 2-3 days (+) upper abdominal and midline pain Skin: Denies abnormal skin rashes Lymphatics: Denies new lymphadenopathy or easy bruising Neurological:Denies numbness, tingling or new weaknesses (+) Memory decline  Behavioral/Psych: Mood is stable, no new changes  All other systems were reviewed with the patient and are negative.  MEDICAL HISTORY:  Past Medical History:  Diagnosis Date   Anxiety    Cancer of ascending colon (HCC)  Arcadiaypertension    Seasonal allergies     SURGICAL HISTORY: Past Surgical History:  Procedure Laterality Date   APPENDECTOMY  1992   CATARACT EXTRACTION W/ INTRAOCULAR LENS IMPLANT Left 04/2016   COLON SURGERY     COLONOSCOPY W/ BIOPSIES AND POLYPECTOMY  04/2015   INGUINAL HERNIA REPAIR Right 1982   IR RADIOLOGIST EVAL & MGMT  01/16/2017   KNEE ARTHROSCOPY W/ MENISCECTOMY Right 1998   LAPAROSCOPIC  RIGHT COLECTOMY N/A 06/14/2016   Procedure: LAPAROSCOPIC HAND ASSISTED HEMICOLECTOMY AND SMALL BOWEL RESECTION.;  Surgeon: [name], MD;  Location: [place];  Service: General;  Laterality: N/A;   LIVER BIOPSY Right 06/14/2016   Procedure: LIVER BIOPSY;  Surgeon: [name], MD;  Location: [place];  Service: General;  Laterality: Right;  Right Inferior Liver   PORTACATH PLACEMENT N/A 07/06/2016   Procedure: INSERTION PORT-A-CATH;  Surgeon: [name], MD;  Location: [place];  Service: General;  Laterality: N/A;   VASECTOMY  2005    I have reviewed the social history and family history with the patient and they are unchanged from previous note.  ALLERGIES:  is allergic to penicillins; xeloda [capecitabine]; and nivolumab.  MEDICATIONS:  Current Outpatient Medications  Medication Sig Dispense Refill   albuterol (VENTOLIN HFA) 108 (90 Base) MCG/ACT inhaler  Inhale 1-2 puffs into the lungs every 6 (six) hours as needed for wheezing or shortness of breath. 1 Inhaler 2   amLODipine (NORVASC) 10 MG tablet Take 1 tablet (10 mg total) by mouth daily. 30 tablet 5   cyclobenzaprine (FLEXERIL) 5 MG tablet Take 1 tablet (5 mg total) by mouth 3 (three) times daily as needed for muscle spasms. 30 tablet 0   dronabinol (MARINOL) 2.5 MG capsule Take 1 capsule (2.5 mg total) by mouth 2 (two) times daily before a meal. 30 capsule 1   fentaNYL (DURAGESIC) 25 MCG/HR Place 1 patch onto the skin every other day. 15 patch 0   gabapentin (NEURONTIN) 300 MG capsule Take 1 capsule (300 mg total) by mouth 3 (three) times daily. 90 capsule 1   HYDROmorphone (DILAUDID) 2 MG tablet Take 1-2 tablets (2-4 mg total) by mouth every 3 (three) hours as needed for severe pain. 120 tablet 0   LORazepam (ATIVAN) 1 MG tablet Take 1 tablet (1 mg total) by mouth every 4 (four) hours as needed for anxiety or sleep (nausea). 30 tablet 0   losartan (COZAAR) 100 MG tablet Take 1 tablet (100 mg total) by mouth daily. 30 tablet 0   Multiple Vitamin (MULTIVITAMIN WITH MINERALS) TABS tablet Take 1 tablet by mouth daily.     omeprazole (PRILOSEC) 20 MG capsule TAKE ONE CAPSULE BY MOUTH TWICE DAILY BEFORE A MEAL (Patient taking differently: Take 20 mg by mouth daily. ) 60 capsule 2   Oxycodone HCl 10 MG TABS Take 1-2 tablets (10-20 mg total) by mouth every 6 (six) hours as needed. 100 tablet 0   polyethylene glycol (MIRALAX / GLYCOLAX) 17 g packet Take 17 g by mouth daily. 14 each 0   prochlorperazine (COMPAZINE) 10 MG tablet Take 1 tablet (10 mg total) by mouth every 6 (six) hours as needed for nausea or vomiting. 30 tablet 3   regorafenib (STIVARGA) 40 MG tablet Take 2 tablets (80 mg total) by mouth daily. Take for 3 weeks on, 1 week off, repeat every 4 weeks. Take with a low fat and low calorie meal 42 tablet 2   senna-docusate (SENOKOT-S) 8.6-50 MG tablet Take 2 tablets by mouth 2  (two) times daily.     sildenafil (REVATIO) 20 MG tablet Take 60-100 mg by mouth See admin instructions. Take 60-100 mg by mouth at least 30 minutes prior to sexual activity     simethicone (MYLICON) 80 MG chewable tablet Chew 1 tablet (80 mg total) by mouth 4 (four) times daily as needed for flatulence. 30 tablet 0   venlafaxine XR (EFFEXOR-XR) 150 MG 24 hr capsule Take  1 capsule (150 mg total) by mouth daily with breakfast.     No current facility-administered medications for this visit.     PHYSICAL EXAMINATION: ECOG PERFORMANCE STATUS: 1 - Symptomatic but completely ambulatory  Vitals:   10/18/18 1320  BP: (!) 131/97  Pulse: (!) 101  Resp: 20  Temp: 97.6 F (36.4 C)  SpO2: 100%   Filed Weights   10/18/18 1320  Weight: 178 lb 11.2 oz (81.1 kg)    GENERAL:alert, no distress and comfortable SKIN: skin color, texture, turgor are normal, no rashes or significant lesions EYES: normal, Conjunctiva are pink and non-injected, sclera clear  NECK: supple, thyroid normal size, non-tender, without nodularity LYMPH:  no palpable lymphadenopathy in the cervical, axillary  LUNGS: clear to auscultation and percussion with normal breathing effort HEART: regular rate & rhythm and no murmurs and no lower extremity edema ABDOMEN:abdomen soft, non-tender and normal bowel sounds Musculoskeletal:no cyanosis of digits and no clubbing  NEURO: alert & oriented x 3 with fluent speech, no focal motor/sensory deficits  LABORATORY DATA:  I have reviewed the data as listed CBC Latest Ref Rng & Units 10/02/2018 09/18/2018 09/04/2018  WBC 4.0 - 10.5 K/uL 8.3 5.9 6.8  Hemoglobin 13.0 - 17.0 g/dL 12.0(L) 12.5(L) 15.0  Hematocrit 39.0 - 52.0 % 37.0(L) 37.9(L) 46.2  Platelets 150 - 400 K/uL 232 179 233     CMP Latest Ref Rng & Units 10/18/2018 10/02/2018 09/18/2018  Glucose 70 - 99 mg/dL 126(H) 99 85  BUN 6 - 20 mg/dL _0 Creatinine 0.61 - 1.24 mg/dL 1.10 1.00 0.91  Sodium 135 - 145 mmol/L 140 139  140  Potassium 3.5 - 5.1 mmol/L 4.6 4.2 4.2  Chloride 98 - 111 mmol/L 108 105 106  CO2 22 - 32 mmol/L _1 Calcium 8.9 - 10.3 mg/dL 8.4(L) 9.0 8.9  Total Protein 6.5 - 8.1 g/dL 6.7 6.8 7.4  Total Bilirubin 0.3 - 1.2 mg/dL 0.6 0.6 0.7  Alkaline Phos 38 - 126 U/L 83 98 89  AST 15 - 41 U/L 35 47(H) 31  ALT 0 - 44 U/L 34 77(H) 24      RADIOGRAPHIC STUDIES: I have personally reviewed the radiological images as listed and agreed with the findings in the report. No results found.   ASSESSMENT & PLAN:  Nicholas Lowery is a 58 y.o. male with   1. Right colon cancer with liver, node and lung metastasis, pT4bN2bM1, stage IV, MSI-stable, KRAS G12D mutation (+) -He was diagnosed in 04/2015. He is s/p right hemicolectomy due to bowel obstruction -He has had multiple line chemo, including FOLFOX, FOLFIRINOX, FOLFIRI, maintenance 5-FU, Xeloda andAvastin(briefly), [id number] 3 weeks on/1 week off andbinimetinib 15 mgBID starting 1/10/20withDr. LTruman Hayward-[facility]he [facility] and developed severe abdominal pain. -He proceeded with4thline therapy withRegorafeniband Nivo based on the REGONIVO clinical trialdata(JCO 37(15), 2019).He started on 09/04/18. He was tolerating well.  -He was not able to gain coverage for Nivolamab, so we canceled after cycle 2. Will continue with single agent Regorafenib and titrated up to 1270mthis week. He will be off next week, then start cycle 3 on 6/17 at 809mor first week, second week at 120m92mhe third week at 160mg23ms main side effect is fatigue and anorexia -Labs reviewed, CBC WNL except BG 126, Ca 8.4, albumin 3.3. CEA and AU still pending.  -His CEA has been trending up, concerning for cancer progression  -f/u in 4 weeks with restaging  CT  2. Upper abdominal pain  -For the lastfewweeks he has had constant upper abdominal pain radiating across base of ribcage with  occasion cramping (4-8/10).  -Iagainadvised him to avoid alcohol consumption as this can further effect his liver and ability to continue treatment. He is willing to reduce his alcohol intake. -CT AP from 08/29/18 showed worsening metastatic colon malignancy and mesenteric and peritoneal nodularity. -His worsening pain is related to his disease progression exacerbated by his anxiety and depression.He wasrecently hospitalized forpain management.  -Ipreviouslyincreasedhis Gabapentin to38m at night to help him sleep. (09/04/18) -He has flexeril for any muscular pain.  -He still has constant right flank pain, midabdominal pain, and right lower chest pain with deep breathing.  -Pain is now manageable with Oxycodone 10-253m2-3 times a day (average 4074may), Dilaudid 2mg77mghtly and Fentanyl25mc50m every 2 days. He feels oxycodone is more effective than dilaudid, and may change dilaudid to oxycodone at night also -Right flank pain has resolved.    3. Depression, Anxiety, Insomnia -Pt has long-standing history of depression, has been on Prozac for decades, it was very well controlled in the past.  -He has good family and social support. -He has become more depressedagainwith disease progressionand stress of prognosis.  -Ipreviouslyoffered chance to speak with our SW [place] [place].He declined for now. -He denies suicidal or self harm thought or ideology. -Currently onEffexor and Ativan.He has been taking Gabapentin TID.  -He is sleeping better now that pain is controlled.  -I encouraged him not to stress about work and to speak to someone ans release emotions.  -Stable mood, he has declined counseling for now.    4. Hypertension  -OnAmlodipineand Lisinopril, I refilled today -He does not want to follow-up with his primary care physician. -Will monitor as Regorafenibmay increase his blood pressure. -BP at131/97today (10/18/18),stable. I  encouraged him to monitor at home.  5. Anemia of iron deficiency and chemo  -He has mild anemia, likely related to his colon cancer bleeding.  -Continueoral iron supplements  6. Constipation  -likely secondary to pain medication -He only had 1 BM every 2-3 days. He takes Miralax every 1-2 days to avoid diarrhea.  -I encouraged him to take Miralax half dose daily. He can take Milk of magnesium or magnesium citrate for severe constipation.   7. Anorexia and weight loss  -Secondary to underlying malignancy -He continues to have weight loss especially with worsened abdominal pain and disease progression.  -His appetite and weight continues to drop. He has lost 10 pounds in the last month.  -I encouraged him to try Marinol more and to make sure he eats increased carbohydrates in diet.  -I encourage him to follow up with [facility] Goal of care discussion, on DNR/DNI -The patient understands the goal of care is palliative.  -Henotes he hasupdatedhis will to include a DNR.   9. Right knee arthritis  -He was offered total knee replacement by orthopedist but declined due to poor prognosis.  -He will continue to get injections in knee. Pain improved with cortisone shot and the pain medication he is on.   10. Memory decline  -He has concerned with recent declined in memory or foggy brain. He does not want this to effects his job.  -I discussed his narcotic use, Effexor and Marinol can all effect his brain.  -I encouraged him to take on a lighter load at work and to not do overtime.  -Will monitor closely.     Plan -  I refilled Fentanyl today, will not refill dilaudid in future   -Labs reviewed and adequate to continue Ragorafinib 119m daily this week. Will be off next week and will start cycle 3 on 10/30/18, with dose 863m 12056mnd 160m58mr first, 2nd and third week.  -Lab and f/u in 4 weeks with restaging CT a few days before    No problem-specific  Assessment & Plan notes found for this encounter.   Orders Placed This Encounter  Procedures   CT Abdomen Pelvis Wo Contrast    Standing Status:   Future    Standing Expiration Date:   10/18/2019    Order Specific Question:   [facility]    [facility]   Is [facility] Contrast requested for this exam?    Answer:   Yes, Per Radiology protocol    Order Specific Question:   Radiology Contrast Protocol - do NOT remove file path    Answer:   \[email or web address]   CT Chest W Contrast    Standing Status:   Future    Standing Expiration Date:   10/18/2019    Order Specific Question:   If indicated for the ordered procedure, I authorize the administration of contrast media per Radiology protocol    Answer:   Yes    Order Specific Question:   [facility]    [facility]   [facility]rast Protocol - do NOT remove file path    Answer:   [email or web address]   All questions were answered. The patient knows to call the clinic with any problems, questions or concerns. No barriers to learning was detected. I spent 20 minutes counseling the patient face to face. The total time spent in the appointment was 25 minutes and more than 50% was on counseling and review of test results     Tayla Panozzo Truitt Merle 10/18/2018   I, AmoyJoslyn Devon acting as scribe for Mckenlee Mangham Truitt Merle.   I have reviewed the above documentation for accuracy and completeness, and I agree with the above.

## 2018-10-18 ENCOUNTER — Other Ambulatory Visit: Payer: Self-pay

## 2018-10-18 ENCOUNTER — Telehealth: Payer: Self-pay | Admitting: Hematology

## 2018-10-18 ENCOUNTER — Inpatient Hospital Stay (HOSPITAL_BASED_OUTPATIENT_CLINIC_OR_DEPARTMENT_OTHER): Payer: BC Managed Care – PPO | Admitting: Hematology

## 2018-10-18 ENCOUNTER — Inpatient Hospital Stay: Payer: BC Managed Care – PPO | Attending: Hematology

## 2018-10-18 ENCOUNTER — Ambulatory Visit: Payer: BLUE CROSS/BLUE SHIELD

## 2018-10-18 ENCOUNTER — Encounter: Payer: Self-pay | Admitting: Hematology

## 2018-10-18 ENCOUNTER — Inpatient Hospital Stay: Payer: BC Managed Care – PPO

## 2018-10-18 VITALS — BP 131/97 | HR 101 | Temp 97.6°F | Resp 20 | Ht 70.0 in | Wt 178.7 lb

## 2018-10-18 DIAGNOSIS — D508 Other iron deficiency anemias: Secondary | ICD-10-CM | POA: Insufficient documentation

## 2018-10-18 DIAGNOSIS — Z66 Do not resuscitate: Secondary | ICD-10-CM | POA: Diagnosis not present

## 2018-10-18 DIAGNOSIS — Z9221 Personal history of antineoplastic chemotherapy: Secondary | ICD-10-CM

## 2018-10-18 DIAGNOSIS — Z9049 Acquired absence of other specified parts of digestive tract: Secondary | ICD-10-CM | POA: Diagnosis not present

## 2018-10-18 DIAGNOSIS — C787 Secondary malignant neoplasm of liver and intrahepatic bile duct: Secondary | ICD-10-CM | POA: Diagnosis not present

## 2018-10-18 DIAGNOSIS — Z79899 Other long term (current) drug therapy: Secondary | ICD-10-CM

## 2018-10-18 DIAGNOSIS — F419 Anxiety disorder, unspecified: Secondary | ICD-10-CM | POA: Diagnosis not present

## 2018-10-18 DIAGNOSIS — F329 Major depressive disorder, single episode, unspecified: Secondary | ICD-10-CM | POA: Diagnosis not present

## 2018-10-18 DIAGNOSIS — R300 Dysuria: Secondary | ICD-10-CM

## 2018-10-18 DIAGNOSIS — G4700 Insomnia, unspecified: Secondary | ICD-10-CM | POA: Diagnosis not present

## 2018-10-18 DIAGNOSIS — R05 Cough: Secondary | ICD-10-CM | POA: Diagnosis not present

## 2018-10-18 DIAGNOSIS — C772 Secondary and unspecified malignant neoplasm of intra-abdominal lymph nodes: Secondary | ICD-10-CM | POA: Diagnosis not present

## 2018-10-18 DIAGNOSIS — C189 Malignant neoplasm of colon, unspecified: Secondary | ICD-10-CM

## 2018-10-18 DIAGNOSIS — R590 Localized enlarged lymph nodes: Secondary | ICD-10-CM | POA: Insufficient documentation

## 2018-10-18 DIAGNOSIS — G893 Neoplasm related pain (acute) (chronic): Secondary | ICD-10-CM

## 2018-10-18 DIAGNOSIS — R413 Other amnesia: Secondary | ICD-10-CM

## 2018-10-18 DIAGNOSIS — C7802 Secondary malignant neoplasm of left lung: Secondary | ICD-10-CM | POA: Diagnosis not present

## 2018-10-18 DIAGNOSIS — M1711 Unilateral primary osteoarthritis, right knee: Secondary | ICD-10-CM | POA: Insufficient documentation

## 2018-10-18 DIAGNOSIS — J90 Pleural effusion, not elsewhere classified: Secondary | ICD-10-CM | POA: Insufficient documentation

## 2018-10-18 DIAGNOSIS — R634 Abnormal weight loss: Secondary | ICD-10-CM | POA: Insufficient documentation

## 2018-10-18 DIAGNOSIS — K5900 Constipation, unspecified: Secondary | ICD-10-CM | POA: Diagnosis not present

## 2018-10-18 DIAGNOSIS — C182 Malignant neoplasm of ascending colon: Secondary | ICD-10-CM | POA: Insufficient documentation

## 2018-10-18 DIAGNOSIS — R062 Wheezing: Secondary | ICD-10-CM | POA: Diagnosis not present

## 2018-10-18 DIAGNOSIS — I10 Essential (primary) hypertension: Secondary | ICD-10-CM | POA: Diagnosis not present

## 2018-10-18 DIAGNOSIS — D500 Iron deficiency anemia secondary to blood loss (chronic): Secondary | ICD-10-CM

## 2018-10-18 DIAGNOSIS — F418 Other specified anxiety disorders: Secondary | ICD-10-CM

## 2018-10-18 DIAGNOSIS — Z95828 Presence of other vascular implants and grafts: Secondary | ICD-10-CM

## 2018-10-18 DIAGNOSIS — C7801 Secondary malignant neoplasm of right lung: Secondary | ICD-10-CM | POA: Diagnosis not present

## 2018-10-18 DIAGNOSIS — G620 Drug-induced polyneuropathy: Secondary | ICD-10-CM

## 2018-10-18 DIAGNOSIS — R630 Anorexia: Secondary | ICD-10-CM | POA: Diagnosis not present

## 2018-10-18 DIAGNOSIS — K5700 Diverticulitis of small intestine with perforation and abscess without bleeding: Secondary | ICD-10-CM

## 2018-10-18 LAB — CMP (CANCER CENTER ONLY)
ALT: 34 U/L (ref 0–44)
AST: 35 U/L (ref 15–41)
Albumin: 3.3 g/dL — ABNORMAL LOW (ref 3.5–5.0)
Alkaline Phosphatase: 83 U/L (ref 38–126)
Anion gap: 8 (ref 5–15)
BUN: 15 mg/dL (ref 6–20)
CO2: 24 mmol/L (ref 22–32)
Calcium: 8.4 mg/dL — ABNORMAL LOW (ref 8.9–10.3)
Chloride: 108 mmol/L (ref 98–111)
Creatinine: 1.1 mg/dL (ref 0.61–1.24)
GFR, Est AFR Am: >60 mL/min (ref >60)
GFR, Estimated: >60 mL/min (ref >60)
Glucose, Bld: 126 mg/dL — ABNORMAL HIGH (ref 70–99)
Potassium: 4.6 mmol/L (ref 3.5–5.1)
Sodium: 140 mmol/L (ref 135–145)
Total Bilirubin: 0.6 mg/dL (ref 0.3–1.2)
Total Protein: 6.7 g/dL (ref 6.5–8.1)

## 2018-10-18 LAB — CEA (IN HOUSE-CHCC): CEA (CHCC-In House): 50.94 ng/mL — ABNORMAL HIGH (ref 0.00–5.00)

## 2018-10-18 MED ORDER — SODIUM CHLORIDE 0.9% FLUSH
10.0000 mL | Freq: Once | INTRAVENOUS | Status: AC
  Administered 2018-10-18: 10 mL
  Filled 2018-10-18: qty 10

## 2018-10-18 MED ORDER — FENTANYL 25 MCG/HR TD PT72: 1.0000 | MEDICATED_PATCH | TRANSDERMAL | 0 refills | Status: DC

## 2018-10-18 MED ORDER — HEPARIN SOD (PORK) LOCK FLUSH 100 UNIT/ML IV SOLN
500.0000 [IU] | Freq: Once | INTRAVENOUS | Status: AC
  Administered 2018-10-18: 500 [IU]
  Filled 2018-10-18: qty 5

## 2018-10-18 NOTE — Telephone Encounter (Signed)
Scheduled appt per 6/5 los. ° °Left a voice message of appt date and time. °

## 2018-10-21 ENCOUNTER — Other Ambulatory Visit: Payer: Self-pay | Admitting: Hematology

## 2018-10-28 ENCOUNTER — Telehealth: Payer: Self-pay | Admitting: Pharmacist

## 2018-10-28 DIAGNOSIS — C189 Malignant neoplasm of colon, unspecified: Secondary | ICD-10-CM

## 2018-10-28 DIAGNOSIS — C787 Secondary malignant neoplasm of liver and intrahepatic bile duct: Secondary | ICD-10-CM

## 2018-10-28 MED ORDER — REGORAFENIB 40 MG PO TABS: ORAL_TABLET | ORAL | 0 refills | Status: DC

## 2018-10-28 NOTE — Telephone Encounter (Signed)
Oral Oncology Pharmacist Encounter  Received call from pharmacist at [facility] to receive new Stivarga prescription as patient's dose has changed since he is no longer receiving Stivarga with Opdivo.  Per 10/18/2018 office note patient has been taking Stivarga 40 mg tablets, 3 tablets (120 mg) by mouth once daily, for 3 weeks on, one-week off, taken with a low-fat and low-calorie meal. Patient is now going to titrate up to target dose of Stivarga of 160 mg once daily on a 3-week on, one-week off schedule.  New verbal prescription provided to pharmacy: Take Stivarga 40 mg tablets, titrate up to target dose of 4 tablets (160 mg) by mouth once daily for 3 weeks on, one-week off, and repeated, quantity #84, refills = 0  I did not give refills on prescription since these directions include to titrate. New prescription for stable dose of Stivarga will need to be provided for next fill.  Johny Drilling, PharmD, BCPS, BCOP  10/28/2018 12:59 PM [facility] [phone]

## 2018-10-29 ENCOUNTER — Inpatient Hospital Stay: Payer: BC Managed Care – PPO

## 2018-10-29 NOTE — Progress Notes (Signed)
Nutrition  RD working remotely.  Called patient for nutrition follow-up.  No answer.  Left message on voicemail with callback number.    Lavinia Mcneely B. Zenia Resides, [place], [place] Registered Dietitian [phone] (pager)

## 2018-10-30 ENCOUNTER — Other Ambulatory Visit: Payer: Self-pay

## 2018-10-30 ENCOUNTER — Encounter: Payer: Self-pay | Admitting: Hematology

## 2018-10-30 ENCOUNTER — Ambulatory Visit: Payer: BLUE CROSS/BLUE SHIELD

## 2018-10-30 ENCOUNTER — Other Ambulatory Visit: Payer: Self-pay | Admitting: Hematology

## 2018-10-30 ENCOUNTER — Ambulatory Visit: Payer: Self-pay | Admitting: Nurse Practitioner

## 2018-10-30 DIAGNOSIS — C787 Secondary malignant neoplasm of liver and intrahepatic bile duct: Secondary | ICD-10-CM

## 2018-10-30 DIAGNOSIS — C189 Malignant neoplasm of colon, unspecified: Secondary | ICD-10-CM

## 2018-11-01 ENCOUNTER — Ambulatory Visit: Payer: BLUE CROSS/BLUE SHIELD

## 2018-11-01 ENCOUNTER — Other Ambulatory Visit: Payer: BLUE CROSS/BLUE SHIELD

## 2018-11-01 ENCOUNTER — Ambulatory Visit: Payer: BLUE CROSS/BLUE SHIELD | Admitting: Hematology

## 2018-11-04 ENCOUNTER — Encounter: Payer: Self-pay | Admitting: Hematology

## 2018-11-04 ENCOUNTER — Other Ambulatory Visit: Payer: Self-pay | Admitting: Hematology

## 2018-11-04 ENCOUNTER — Encounter: Payer: Self-pay | Admitting: Nurse Practitioner

## 2018-11-04 ENCOUNTER — Other Ambulatory Visit: Payer: Self-pay | Admitting: Nurse Practitioner

## 2018-11-04 DIAGNOSIS — R05 Cough: Secondary | ICD-10-CM

## 2018-11-04 DIAGNOSIS — R058 Other specified cough: Secondary | ICD-10-CM

## 2018-11-04 MED ORDER — OXYCODONE HCL 10 MG PO TABS: 10.0000 mg | ORAL_TABLET | Freq: Four times a day (QID) | ORAL | 0 refills | Status: DC | PRN

## 2018-11-04 MED ORDER — CYCLOBENZAPRINE HCL 5 MG PO TABS: 5.0000 mg | ORAL_TABLET | Freq: Three times a day (TID) | ORAL | 0 refills | Status: DC | PRN

## 2018-11-05 ENCOUNTER — Ambulatory Visit (HOSPITAL_COMMUNITY)
Admission: RE | Admit: 2018-11-05 | Discharge: 2018-11-05 | Disposition: A | Discharge status: Home / Self Care | Payer: BC Managed Care – PPO | Source: Ambulatory Visit | Attending: Nurse Practitioner | Admitting: Nurse Practitioner

## 2018-11-05 ENCOUNTER — Telehealth: Payer: Self-pay

## 2018-11-05 ENCOUNTER — Telehealth: Payer: Self-pay | Admitting: Nurse Practitioner

## 2018-11-05 ENCOUNTER — Encounter: Payer: Self-pay | Admitting: Nurse Practitioner

## 2018-11-05 ENCOUNTER — Other Ambulatory Visit: Payer: Self-pay

## 2018-11-05 ENCOUNTER — Inpatient Hospital Stay: Payer: BC Managed Care – PPO

## 2018-11-05 ENCOUNTER — Inpatient Hospital Stay (HOSPITAL_BASED_OUTPATIENT_CLINIC_OR_DEPARTMENT_OTHER): Payer: BC Managed Care – PPO | Admitting: Nurse Practitioner

## 2018-11-05 VITALS — BP 133/97 | HR 96 | Temp 98.0°F | Resp 18

## 2018-11-05 DIAGNOSIS — Z95828 Presence of other vascular implants and grafts: Secondary | ICD-10-CM

## 2018-11-05 DIAGNOSIS — C189 Malignant neoplasm of colon, unspecified: Secondary | ICD-10-CM

## 2018-11-05 DIAGNOSIS — C7802 Secondary malignant neoplasm of left lung: Secondary | ICD-10-CM

## 2018-11-05 DIAGNOSIS — R05 Cough: Secondary | ICD-10-CM | POA: Diagnosis not present

## 2018-11-05 DIAGNOSIS — K5900 Constipation, unspecified: Secondary | ICD-10-CM

## 2018-11-05 DIAGNOSIS — M1711 Unilateral primary osteoarthritis, right knee: Secondary | ICD-10-CM

## 2018-11-05 DIAGNOSIS — R634 Abnormal weight loss: Secondary | ICD-10-CM

## 2018-11-05 DIAGNOSIS — G4700 Insomnia, unspecified: Secondary | ICD-10-CM

## 2018-11-05 DIAGNOSIS — D508 Other iron deficiency anemias: Secondary | ICD-10-CM

## 2018-11-05 DIAGNOSIS — F418 Other specified anxiety disorders: Secondary | ICD-10-CM

## 2018-11-05 DIAGNOSIS — R062 Wheezing: Secondary | ICD-10-CM

## 2018-11-05 DIAGNOSIS — R058 Other specified cough: Secondary | ICD-10-CM

## 2018-11-05 DIAGNOSIS — R590 Localized enlarged lymph nodes: Secondary | ICD-10-CM

## 2018-11-05 DIAGNOSIS — Z9221 Personal history of antineoplastic chemotherapy: Secondary | ICD-10-CM

## 2018-11-05 DIAGNOSIS — C182 Malignant neoplasm of ascending colon: Secondary | ICD-10-CM | POA: Diagnosis not present

## 2018-11-05 DIAGNOSIS — R630 Anorexia: Secondary | ICD-10-CM

## 2018-11-05 DIAGNOSIS — C772 Secondary and unspecified malignant neoplasm of intra-abdominal lymph nodes: Secondary | ICD-10-CM

## 2018-11-05 DIAGNOSIS — D500 Iron deficiency anemia secondary to blood loss (chronic): Secondary | ICD-10-CM

## 2018-11-05 DIAGNOSIS — G893 Neoplasm related pain (acute) (chronic): Secondary | ICD-10-CM

## 2018-11-05 DIAGNOSIS — Z9049 Acquired absence of other specified parts of digestive tract: Secondary | ICD-10-CM

## 2018-11-05 DIAGNOSIS — C7801 Secondary malignant neoplasm of right lung: Secondary | ICD-10-CM

## 2018-11-05 DIAGNOSIS — Z66 Do not resuscitate: Secondary | ICD-10-CM

## 2018-11-05 DIAGNOSIS — J90 Pleural effusion, not elsewhere classified: Secondary | ICD-10-CM

## 2018-11-05 DIAGNOSIS — I10 Essential (primary) hypertension: Secondary | ICD-10-CM

## 2018-11-05 DIAGNOSIS — C787 Secondary malignant neoplasm of liver and intrahepatic bile duct: Secondary | ICD-10-CM

## 2018-11-05 DIAGNOSIS — F329 Major depressive disorder, single episode, unspecified: Secondary | ICD-10-CM

## 2018-11-05 DIAGNOSIS — R413 Other amnesia: Secondary | ICD-10-CM

## 2018-11-05 DIAGNOSIS — Z79899 Other long term (current) drug therapy: Secondary | ICD-10-CM

## 2018-11-05 LAB — CBC WITH DIFFERENTIAL (CANCER CENTER ONLY)
Abs Immature Granulocytes: 0.02 10*3/uL (ref 0.00–0.07)
Basophils Absolute: 0.1 10*3/uL (ref 0.0–0.1)
Basophils Relative: 1 %
Eosinophils Absolute: 0.9 10*3/uL — ABNORMAL HIGH (ref 0.0–0.5)
Eosinophils Relative: 13 %
HCT: 30.6 % — ABNORMAL LOW (ref 39.0–52.0)
Hemoglobin: 9.8 g/dL — ABNORMAL LOW (ref 13.0–17.0)
Immature Granulocytes: 0 %
Lymphocytes Relative: 9 %
Lymphs Abs: 0.6 10*3/uL — ABNORMAL LOW (ref 0.7–4.0)
MCH: 29.6 pg (ref 26.0–34.0)
MCHC: 32 g/dL (ref 30.0–36.0)
MCV: 92.4 fL (ref 80.0–100.0)
Monocytes Absolute: 0.9 10*3/uL (ref 0.1–1.0)
Monocytes Relative: 13 %
Neutro Abs: 4.4 10*3/uL (ref 1.7–7.7)
Neutrophils Relative %: 64 %
Platelet Count: 175 10*3/uL (ref 150–400)
RBC: 3.31 MIL/uL — ABNORMAL LOW (ref 4.22–5.81)
RDW: 14.6 % (ref 11.5–15.5)
WBC Count: 6.8 10*3/uL (ref 4.0–10.5)
nRBC: 0 % (ref 0.0–0.2)

## 2018-11-05 MED ORDER — LEVOFLOXACIN 750 MG PO TABS: 750.0000 mg | ORAL_TABLET | Freq: Every day | ORAL | 0 refills | Status: DC

## 2018-11-05 NOTE — Progress Notes (Signed)
Nicholas Lowery   [phone] [phone]   Clinic Follow up Note   Patient Care Team: Curlene Labrum, MD as PCP - General (Family Medicine) 11/05/2018  CHIEF COMPLAINT: Cough; f/u metastatic colon cancer   SUMMARY OF ONCOLOGIC HISTORY: Oncology History Overview Note  Cancer Staging Metastatic colon cancer to liver [facility]) Staging form: Colon and Rectum, AJCC 8th Edition - Clinical stage from 06/01/2016: Stage IVA (cTX, cNX, pM1a) - Signed by Truitt Merle, MD on 07/04/2016 - Pathologic stage from 06/14/2016: Stage IVA (pT4b(m), pN2b, pM1a) - Signed by Truitt Merle, MD on 07/04/2016     Metastatic colon cancer to liver ([place])  04/2015 Procedure   Colonoscopy by Dr. Ladona Horns. It showed showed 2 sessile polyps ready between 3-5 mm in size located 20 cm (A, B) from the point of entry, polypectomy was performed. Pedunculated polyp was found in the ascending colon (C), polypectomy was performed, and additional polyp (D) was found 30 cm from the point of entry, removed   04/2015 Pathology Results   tubular adenoma (A and B), and well differentiated adenocarcinoma arising from tubulovillous adenoma (C) and well differentiated adenocarcinoma arising from severe dysplasia to intramucosal carcinoma within tubular adenoma.    04/2015 Initial Diagnosis   Metastatic colon cancer to liver ([place])   05/29/2016 Imaging   CT abdomen and pelvis with contrast showed an apple core like stricture in right colon just above the cecum, measuring 3.2 cm in lengths. This is highly suspicious for malignancy. Small lymph node a noticed he had adjacent mesentery, measuring 8 mm. There is a low-density lesion within the inferior aspect of the right hepatic lobe measuring 1.7 cm, suspicious for metastasis.    06/06/2016 Tumor Marker   CEA 9.99   06/08/2016 PET scan   IMPRESSION: Approximately 3 cm hypermetabolic mass in the ascending colon, consistent with primary colon carcinoma. This mass  results in colonic obstruction and small bowel dilatation. Additional areas of hypermetabolic wall thickening in the cecum may represent other sites of colon carcinoma or colitis. Mild hypermetabolic lymphadenopathy in right pericolonic region, porta hepatis, and aortocaval space, consistent with metastatic disease. Mild hypermetabolic mediastinal lymphadenopathy also seen, and thoracic lymph node metastases cannot be excluded. Solitary hypermetabolic focus in inferior right hepatic lobe, consistent with liver metastasis. Consider abdomen MRI without and with contrast for further evaluation.   06/14/2016 Surgery   Hand assisted right hemicolectomy and small bowel resection for colon cancer, liver biopsy, by Dr. Barry Dienes   06/14/2016 Pathology Results   Right hemicolectomy showed invasive well to moderately differentiated adenocarcinoma, 2 foci measuring 7.5 cm and 4.5 cm, tumor invades through full thickness of colon, to the seroma and involve the Small Bowel, Surgical Margins Were Negative, 24 Out Of 64 Lymph Nodes Were Positive, Extracapsular Extension Identified, Multiple Satellite Tumor Deposits Present, Liver Biopsy Showed Metastatic Adenocarcinoma.     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 No BRAF and NRAS mutation. MSI-stable, tumor burden low.   07/06/2016 Tumor Marker   CEA 13.69   07/13/2016 - 01/18/2017 Chemotherapy   mFOLFOX, every 2 weeks, started on 07/14/2015, Avastin added from cycle 3  Oxaliplatin dose to 52m/m2 due to side effects and some cytopenia on 09/21/16  Changed to FOLFIRINOX starting cycle 7 and Reduced  Due to neuropathy hold Oxaliplatin and add Irinotecan with neulasta on day 3 starting with cycle  7 Add low dose Oxaliplatin with cycle 8.  Due to his worsening neuropathy, and good response to chemotherapy, I previously stopped oxaliplatin from  cycle 11, and continue FOLFIRI and avastin      07/27/2016 Tumor Marker   CEA 15.85   09/04/2016 Imaging   Ct C/A/P W Contrast IMPRESSION: Interval right colectomy. Stable small liver metastasis in the inferior right hepatic lobe. Stable mild porta hepatis and aortocaval lymphadenopathy. Stable mild mediastinal lymphadenopathy. No new or progressive metastatic disease identified within the chest, abdomen, or pelvis.   12/19/2016 PET scan   IMPRESSION: 1. Right hemicolectomy, with resolution of the prior hypermetabolic activity inferiorly in the right hepatic lobe, in several mediastinal lymph nodes, and in lymph nodes in the retroperitoneum and porta hepatis. No residual hypermetabolic or enlarged lymph nodes are identified. 2. Low-grade diffuse skeletal metabolic activity is likely therapy related. 3. Coronary atherosclerosis. 4. 3 by 4 mm right middle lobe pulmonary nodule is stable, not appreciably hypermetabolic, but below sensitive PET-CT size thresholds. This may warrant surveillance.   01/11/2017 Imaging   MR Abdomen W WO Contrast IMPRESSION: 1. No acute findings within the abdomen. Previously noted liver metastasis has resolved in the interval. No new lesions.   02/01/2017 - 09/2017 Chemotherapy   Maintenance therapy, Xeloda '2000mg'$  (1000 mg/m2)  q12h on day 1-14 every 21 days plus AVASTIN, starting 02/01/2017.  stopped after 12 days due to poor tolerance on 02/14/17  Changed to maintenance 5-FU and avastin every 2 weeks starting on 02/22/17, stopped in 09/2017.      04/10/2017 Imaging   IMPRESSION: 1. Status post right hemicolectomy without findings for recurrent tumor. 2. No worrisome hepatic lesions. Treated disease with only a small residual low attenuation lesion in the right hepatic lobe. 3. No recurrent mediastinal or abdominal lymphadenopathy.    07/09/2017 PET scan   PET 07/09/17 IMPRESSION: 1. Status post right hemicolectomy, without findings of hypermetabolic  recurrent or metastatic disease. 2. A right middle lobe pulmonary nodule is unchanged. However, there is a right lower lobe 5 mm pulmonary nodule which is felt to be new and enlarged compared to prior exams. Suspicious for an isolated pulmonary metastasis. Consider CT follow-up at 3-6 months. 3. Age advanced coronary artery atherosclerosis. Recommend assessment of coronary risk factors and consideration of medical therapy. 4. Borderline ascending aortic dilatation, 4.0 cm     10/10/2017 Imaging   IMPRESSION: 1. Several (at least 10) subcentimeter pulmonary nodules scattered in both lungs, predominantly in the lower lobes, all new/increased, most compatible with enlarging pulmonary metastases, largest 8 mm in the right lower lobe. 2. No additional findings of new or progressive metastatic disease. No recurrent adenopathy. Stable small low-attenuation lesion in the inferior right liver lobe compatible with treated metastasis. No new liver metastases. 3. Stable ectatic 4.0 cm ascending thoracic aorta. Recommend annual imaging followup by CTA or MRA.  4. Stable mild splenomegaly.    10/11/2017 - 12/2017 Chemotherapy   FOLFIRI and Avastin every 2 weeks starting 10/11/17, irnotecan stopped in 12/2017 due to disease progression     01/08/2018 Imaging   01/08/2018 CT CAP IMPRESSION: 1. Progressive hepatic and pulmonary metastatic disease. 2. Ascending Aortic aneurysm NOS (ICD10-I71.9).   01/08/2018 Progression   01/08/2018 CT CAP IMPRESSION: 1. Progressive hepatic and pulmonary metastatic disease. 2. Ascending Aortic aneurysm NOS (ICD10-I71.9).   01/10/2018 - 03/07/2018 Chemotherapy   FOLFOX and Avastin every 2 weeks starting on 01/10/2018. Stopped due to disease progression    03/19/2018 Progression   03/19/2018 CT  CAP IMPRESSION: 1. Interval increase in size of dominant nodule within the right lower lobe. There are a few additional nodules which have increased in size. Multiple additional  small bilateral pulmonary nodules are grossly similar. 2. Slight interval increase in size of lesion within the right hepatic lobe. 3. Interval increase in soft tissue at the surgical anastomosis involving the small bowel within the central abdomen. Locally recurrent disease not excluded.    04/07/2018 - 04/2018 Chemotherapy   Third lineXeloda '1500mg'$  BID 7 days on/7 days off and Avastin every 2 weeksstarting 04/07/18 stopped after 1 cycle and 4 days because he started Clinical trail.    05/22/2018 Imaging   CT CAP [facility] 05/22/18 [facility] FINDINGS:   LINES AND TUBES: None.  LOWER THORAX: Please see same day CT chest for findings above the diaphragm.Marland Kitchen  HEPATOBILIARY: Low-attenuation lesion in segment 5 has minimally increased in size from prior measuring 2.4 x 2.0 cm, previously 2.3 x 1.8 cm when remeasured (8:42). The gallbladder is present and otherwise unremarkable. No biliary dilatation.   SPLEEN: Unremarkable. PANCREAS: Unremarkable.  ADRENALS: Unremarkable. KIDNEYS/URETERS: Unremarkable.  BLADDER: Decompressed making further evaluation difficult. PELVIC/REPRODUCTIVE ORGANS: Unremarkable.  GI TRACT: No dilated or thick walled loops of bowel. Sequelae of right hemicolectomy and appendectomy.   PERITONEUM/RETROPERITONEUM AND MESENTERY: Minimally decreased spiculated mesenteric soft tissue density at the site of enteroenteric anastomosis, now measuring roughly 2.6 x 1.4 cm, previously 3.3 x 3.1 cm (8:54). There is also a mild amount of stranding near the anastomosis within the mid abdomen (8:54)   LYMPH NODES: Borderline enlarged 1.0 cm porta hepatic lymph node, previously 0.7 cm (8:26). VESSELS: The aorta is normal in caliber.  No significant calcified atherosclerotic disease. Though the contrast timing is suboptimal for evaluation of the portal venous system, the portal venous system is appears patent. The hepatic veins and IVC are unremarkable.  BONES AND SOFT TISSUES: Small  fat-containing ventral hernia..   05/24/2018 - 08/02/2018 Chemotherapy   [facility] [id number] with palbociclib Nicholas Lowery) '100mg'$  3 weeks on/1 week off and binimetinib 15 mg BID starting 05/24/18. Stopped 08/02/18 due to disease progression.    08/02/2018 Imaging   CT CAP 08/02/18 [facility] IMPRESSION:  Since 05/22/2018:  -Unchanged to mildly decreased in size segment 6 hepatic lesion.  -Unchanged irregular soft tissue density at the enteroenteric anastomosis.  -Increase in size of an irregular soft tissue density at the enterocolic anastomosis. This is new since 03/19/2018.  -Mild decrease in size of porta hepatis lymph node.  IMPRESSION:  Slight interval increase in multiple metastatic pulmonary nodules.  New likely malignant small right pleural effusion. Interval prominent right infrahilar lymph nodes; metastasis is in the differential. Mild interlobular septal thickening in the right middle and lower lobes is concerning for lymphangitic tumor spread. Attention on follow-up is suggested.     08/29/2018 Imaging   CT AP 08/29/18 IMPRESSION: 1. No acute CT findings of the abdomen or pelvis to explain abdominal pain.  2. Evidence of worsening metastatic colon malignancy status post right colon resection, including increasing mesenteric nodularity, increasing peritoneal nodularity, enlarging liver metastasis, and new and enlarging pulmonary nodules.  3. Trace perihepatic ascites, of uncertain etiology, although presumably malignant given peritoneal metastatic disease.   09/02/2018 Imaging   CT chest 09/02/18  IMPRESSION: 1. Unfortunately there is interval increase in size of bilateral pulmonary nodules consistent with progression of pulmonary metastasis. 2. New small moderate RIGHT pleural effusion. 3. Interval increase in size of small mediastinal lymph nodes most consistent  with metastatic adenopathy. 4. Interval increase in size of hepatic metastasis in the  inferior RIGHT hepatic lobe compared to CT 03/19/2018.   09/04/2018 -  Chemotherapy   oral Ragorafinib '80mg'$  daily 3 weeks on, 1 week off every 28 days as tolerated and IV Nivolumab '3mg'$ /kg q2weeks starting 09/04/18     CURRENT THERAPY: oral Regorafenib'80mg'$ daily3 weeks on, 1 week offand IV Nivolumab '3mg'$ [place] starting 09/04/18. Nivo d/c after cycle 2 due to lack of coverage.   INTERVAL HISTORY: Mr. Newby presents today for symptom management visit for few months history of productive cough. Cough began when he came off [facility] clinical trial earlier this year. Phlegm is yellow in the morning and grey/green in the evening, never blood-tinged. He has chest pain when he coughs. Wheezes at night. Using albuterol inh but not sure if it's working. Hasn't tried OTC cough meds. Denies fever, chills, dyspnea. Nonsmoker.  He began cycle 3 Regorafenib on 6/17. Tolerating well so far. Denies n/v/c/d. BM every other day, denies blood in stool. Appetite is low, drinks supplement for breakfast daily. RUQ pain is stable, around 4/10. Has occasional transient right chest pain. Denies exertional chest pain. He takes 3-4 tabs oxycodone 3-4 times per day. Changes fentanyl patch every other day. Gets anxious, edgy if he is away from [place] for long periods of time. Has cut back alcohol, drinks couple beers every other day. He continues to work. Neuropathy is improved.    MEDICAL HISTORY:  Past Medical History:  Diagnosis Date   Anxiety    Cancer of ascending colon ([place])    Hypertension    Seasonal allergies     SURGICAL HISTORY: Past Surgical History:  Procedure Laterality Date   APPENDECTOMY  1992   CATARACT EXTRACTION W/ INTRAOCULAR LENS IMPLANT Left 04/2016   COLON SURGERY     COLONOSCOPY W/ BIOPSIES AND POLYPECTOMY  04/2015   INGUINAL HERNIA REPAIR Right 1982   IR RADIOLOGIST EVAL & MGMT  01/16/2017   KNEE ARTHROSCOPY W/ MENISCECTOMY Right 1998   LAPAROSCOPIC RIGHT COLECTOMY N/A  06/14/2016   Procedure: LAPAROSCOPIC HAND ASSISTED HEMICOLECTOMY AND SMALL BOWEL RESECTION.;  Surgeon: [name], MD;  Location: [place];  Service: General;  Laterality: N/A;   LIVER BIOPSY Right 06/14/2016   Procedure: LIVER BIOPSY;  Surgeon: [name], MD;  Location: [place];  Service: General;  Laterality: Right;  Right Inferior Liver   PORTACATH PLACEMENT N/A 07/06/2016   Procedure: INSERTION PORT-A-CATH;  Surgeon: [name], MD;  Location: [place];  Service: General;  Laterality: N/A;   VASECTOMY  2005    I have reviewed the social history and family history with the patient and they are unchanged from previous note.  ALLERGIES:  is allergic to penicillins; xeloda [capecitabine]; and nivolumab.  MEDICATIONS:  Current Outpatient Medications  Medication Sig Dispense Refill   albuterol (VENTOLIN HFA) 108 (90 Base) MCG/ACT inhaler Inhale 1-2 puffs into the lungs every 6 (six) hours as needed for wheezing or shortness of breath. 1 Inhaler 2   amLODipine (NORVASC) 10 MG tablet Take 1 tablet (10 mg total) by mouth daily. 30 tablet 5   cyclobenzaprine (FLEXERIL) 5 MG tablet Take 1 tablet (5 mg total) by mouth 3 (three) times daily as needed for muscle spasms. 30 tablet 0   dronabinol (MARINOL) 2.5 MG capsule Take 1 capsule (2.5 mg total) by mouth 2 (two) times daily before a meal. 30 capsule 1   fentaNYL (DURAGESIC) 25 MCG/HR Place 1 patch onto the skin every  other day. 15 patch 0   gabapentin (NEURONTIN) 300 MG capsule TAKE ONE CAPSULE BY MOUTH THREE TIMES DAILY 90 capsule 1   HYDROmorphone (DILAUDID) 2 MG tablet Take 1-2 tablets (2-4 mg total) by mouth every 3 (three) hours as needed for severe pain. 120 tablet 0   levofloxacin (LEVAQUIN) 750 MG tablet Take 1 tablet (750 mg total) by mouth daily. 7 tablet 0   LORazepam (ATIVAN) 1 MG tablet Take 1 tablet (1 mg total) by mouth every 4 (four) hours as needed for anxiety or sleep (nausea). 30 tablet 0   losartan  (COZAAR) 100 MG tablet Take 1 tablet (100 mg total) by mouth daily. 30 tablet 0   Multiple Vitamin (MULTIVITAMIN WITH MINERALS) TABS tablet Take 1 tablet by mouth daily.     omeprazole (PRILOSEC) 20 MG capsule TAKE ONE CAPSULE BY MOUTH TWICE DAILY BEFORE A MEAL (Patient taking differently: Take 20 mg by mouth daily. ) 60 capsule 2   Oxycodone HCl 10 MG TABS Take 1-2 tablets (10-20 mg total) by mouth every 6 (six) hours as needed. 100 tablet 0   polyethylene glycol (MIRALAX / GLYCOLAX) 17 g packet Take 17 g by mouth daily. 14 each 0   prochlorperazine (COMPAZINE) 10 MG tablet Take 1 tablet (10 mg total) by mouth every 6 (six) hours as needed for nausea or vomiting. 30 tablet 3   regorafenib (STIVARGA) 40 MG tablet Titrate up to 4 tablets (125m) by mouth once daily as directed. Take for 3 weeks on, 1 week off, repeat every 4 weeks. Take with a low fat and low calorie meal 84 tablet 0   senna-docusate (SENOKOT-S) 8.6-50 MG tablet Take 2 tablets by mouth 2 (two) times daily.     sildenafil (REVATIO) 20 MG tablet Take 60-100 mg by mouth See admin instructions. Take 60-100 mg by mouth at least 30 minutes prior to sexual activity     simethicone (MYLICON) 80 MG chewable tablet Chew 1 tablet (80 mg total) by mouth 4 (four) times daily as needed for flatulence. 30 tablet 0   venlafaxine XR (EFFEXOR-XR) 150 MG 24 hr capsule Take 1 capsule (150 mg total) by mouth daily with breakfast.     No current facility-administered medications for this visit.     PHYSICAL EXAMINATION: ECOG PERFORMANCE STATUS: 1 - Symptomatic but completely ambulatory  Vitals:   11/05/18 0933  BP: (!) 133/97  Pulse: 96  Resp: 18  Temp: 98 F (36.7 C)  SpO2: 97%   GENERAL:alert, no distress and comfortable EYES:  sclera clear LUNGS: rhonchi and wheezing, clears with coughing. Respirations even and unlabored  Musculoskeletal:no cyanosis of digits  NEURO: alert & oriented x 3 with fluent speech, normal gait    LABORATORY DATA:  I have reviewed the data as listed CBC Latest Ref Rng & Units 11/05/2018 10/02/2018 09/18/2018  WBC 4.0 - 10.5 K/uL 6.8 8.3 5.9  Hemoglobin 13.0 - 17.0 g/dL 9.8(L) 12.0(L) 12.5(L)  Hematocrit 39.0 - 52.0 % 30.6(L) 37.0(L) 37.9(L)  Platelets 150 - 400 K/uL 175 232 179     CMP Latest Ref Rng & Units 10/18/2018 10/02/2018 09/18/2018  Glucose 70 - 99 mg/dL 126(H) 99 85  BUN 6 - 20 mg/dL _0 Creatinine 0.61 - 1.24 mg/dL 1.10 1.00 0.91  Sodium 135 - 145 mmol/L 140 139 140  Potassium 3.5 - 5.1 mmol/L 4.6 4.2 4.2  Chloride 98 - 111 mmol/L 108 105 106  CO2 22 - 32 mmol/L 24 25 24  Calcium 8.9 - 10.3 mg/dL 8.4(L) 9.0 8.9  Total Protein 6.5 - 8.1 g/dL 6.7 6.8 7.4  Total Bilirubin 0.3 - 1.2 mg/dL 0.6 0.6 0.7  Alkaline Phos 38 - 126 U/L 83 98 89  AST 15 - 41 U/L 35 47(H) 31  ALT 0 - 44 U/L 34 77(H) 24      RADIOGRAPHIC STUDIES: I have personally reviewed the radiological images as listed and agreed with the findings in the report. Dg Chest 2 View  Result Date: 11/05/2018 CLINICAL DATA:  History of metastatic colon cancer. Cough for 2-3 months. EXAM: CHEST - 2 VIEW COMPARISON:  CT chest 09/02/2018.  PA and lateral chest 02/24/2017. FINDINGS: The patient has a small right pleural effusion and basilar atelectasis. Scattered pulmonary nodules are identified, most prominent in the right lower lobe. No pneumothorax. No consolidative process. Heart size is normal. No acute or focal bony abnormality. IMPRESSION: Small right pleural effusion and pulmonary nodules. The appearance is not markedly changed compared to the prior chest CT. Electronically Signed   By: Inge Rise M.D.   On: 11/05/2018 08:58     ASSESSMENT & PLAN: Nicholas Lowery is a 58 y.o. male with   1. Possible URI 2. Right colon cancer with liver, node and lung metastasis, pT4bN2bM1, stage IV, MSI-stable, KRAS G12D mutation (+) -He was diagnosed in 04/2015. He is s/p right hemicolectomy due to bowel  obstruction -S/p multiple line chemo, including FOLFOX, FOLFIRINOX, FOLFIRI, maintenance 5-FU, Xeloda andAvastin(briefly), [id number] 3 weeks on/1 week off andbinimetinib 15 mgBID starting 1/10/20withDr. LTruman Hayward-Unfortunatelyhe [facility] and developed severe abdominal pain.  -He proceeded with4thline therapy withRegorafeniband Nivo based on the REGONIVO clinical trialdata(JCO 37(15), 2019).He started on 09/04/18. He was tolerating well.  -insurance did not cover Nivolamab, so we canceled after cycle 2. Will continue with single agent Regorafenib and titrated up to 1256m Began cycle 3 on 6/17 at 8083mor first week, second week at 120m58mhe third week at 160mg63m tolerates well overall, main side effects are fatigue and anorexia  3. Upper abdominal pain  4. Depression, Anxiety, Insomnia 5. Hypertension  6. Anemia of iron deficiency and chemo  7. Constipation  8. Anorexia and weight loss  9. Goal of care discussion, on DNR/DNI -[place] he hasupdatedhis will to include a DNR. 10. Right knee arthritis  11. Memory decline   Mr. CaldwEmbertonars stable. He has few months history of productive cough now with wheezing. Afebrile. WBC is normal. Chest xray shows small right pleural effusion, no infectious process. I advise him to continue albuterol inh PRN. He can try OTC cough suppressants. Instructed him to hydrate well with water. I prescribed a course of levaquin to cover bacterial etiology. I encouraged him to call if his cough worsens or fails to improve on therapy, or he develops dyspnea, fever. He understands.   From a metastatic colon cancer standpoint, he is stable. He built tolerance to oxycodone and subsequently increased his dose, has been taking 30-40 mg oxycodone at a time, 3-4 times daily. I reviewed dosing instructions and cautioned him to the addictive nature of this medication. I strongly encouraged  him to take as prescribed. And do not drive after he's taken it. He does not feel drowsy or impaired after he takes medication and agrees to be cautious. I refilled oxycodone and flexeril on 11/04/18. Dilaudid is not as effective for him. I discontinued.   He continues regorafenib, currently on week 2 of cycle 3,  he titrates up to 120 mg daily this week, then 160 mg daily for week 3. He is tolerating well, main side effect is anorexia. I encouraged him to use supplements and eat small/frequent amounts. He continues marinol. Mentally, he is stable, but anxious, edgy, and tearful at times. Continue Effexor. He does not see Dr. Cheryln Manly anymore, he felt there was no longer a benefit.   BP elevated, on anti-hypertensives. CBC only today, anemia slightly worse. Hgb 9.8. denies bleeding.  He will return for lab and f/u with [name] after restaging CT on 11/13/18. He knows to call sooner if needed.    Orders Placed This Encounter  Procedures   CBC with Differential ([place] Only)    Standing Status:   Future    Number of Occurrences:   1    Standing Expiration Date:   11/05/2019   All questions were answered. The patient knows to call the clinic with any problems, questions or concerns. No barriers to learning was detected.     Alla Feeling, NP 11/05/18

## 2018-11-05 NOTE — Telephone Encounter (Signed)
Cancelled appt per 6/23 los.

## 2018-11-05 NOTE — Telephone Encounter (Signed)
TC from [place] in lab. Patient needed orders in for CBC today. Orders put in.

## 2018-11-07 ENCOUNTER — Encounter: Payer: Self-pay | Admitting: Hematology

## 2018-11-07 ENCOUNTER — Encounter: Payer: Self-pay | Admitting: Nurse Practitioner

## 2018-11-07 ENCOUNTER — Ambulatory Visit: Payer: BLUE CROSS/BLUE SHIELD | Admitting: Nurse Practitioner

## 2018-11-12 ENCOUNTER — Other Ambulatory Visit: Payer: Self-pay | Admitting: Hematology

## 2018-11-12 DIAGNOSIS — C189 Malignant neoplasm of colon, unspecified: Secondary | ICD-10-CM

## 2018-11-12 NOTE — Progress Notes (Signed)
[place]   [phone] [phone]   Clinic Follow up Note   Patient Care Team: Curlene Labrum, MD as PCP - General (Family Medicine)  Date of Service:  11/14/2018  CHIEF COMPLAINT: F/u of metastatic colon cancer  SUMMARY OF ONCOLOGIC HISTORY: Oncology History Overview Note  Cancer Staging Metastatic colon cancer to liver [facility]) Staging form: Colon and Rectum, AJCC 8th Edition - Clinical stage from 06/01/2016: Stage IVA (cTX, cNX, pM1a) - Signed by Truitt Merle, MD on 07/04/2016 - Pathologic stage from 06/14/2016: Stage IVA (pT4b(m), pN2b, pM1a) - Signed by Truitt Merle, MD on 07/04/2016     Metastatic colon cancer to liver ([place])  04/2015 Procedure   Colonoscopy by Dr. Ladona Horns. It showed showed 2 sessile polyps ready between 3-5 mm in size located 20 cm (A, B) from the point of entry, polypectomy was performed. Pedunculated polyp was found in the ascending colon (C), polypectomy was performed, and additional polyp (D) was found 30 cm from the point of entry, removed   04/2015 Pathology Results   tubular adenoma (A and B), and well differentiated adenocarcinoma arising from tubulovillous adenoma (C) and well differentiated adenocarcinoma arising from severe dysplasia to intramucosal carcinoma within tubular adenoma.    04/2015 Initial Diagnosis   Metastatic colon cancer to liver ([place])   05/29/2016 Imaging   CT abdomen and pelvis with contrast showed an apple core like stricture in right colon just above the cecum, measuring 3.2 cm in lengths. This is highly suspicious for malignancy. Small lymph node a noticed he had adjacent mesentery, measuring 8 mm. There is a low-density lesion within the inferior aspect of the right hepatic lobe measuring 1.7 cm, suspicious for metastasis.    06/06/2016 Tumor Marker   CEA 9.99   06/08/2016 PET scan   IMPRESSION: Approximately 3 cm hypermetabolic mass in the ascending colon, consistent with primary colon carcinoma.  This mass results in colonic obstruction and small bowel dilatation. Additional areas of hypermetabolic wall thickening in the cecum may represent other sites of colon carcinoma or colitis. Mild hypermetabolic lymphadenopathy in right pericolonic region, porta hepatis, and aortocaval space, consistent with metastatic disease. Mild hypermetabolic mediastinal lymphadenopathy also seen, and thoracic lymph node metastases cannot be excluded. Solitary hypermetabolic focus in inferior right hepatic lobe, consistent with liver metastasis. Consider abdomen MRI without and with contrast for further evaluation.   06/14/2016 Surgery   Hand assisted right hemicolectomy and small bowel resection for colon cancer, liver biopsy, by Dr. Barry Dienes   06/14/2016 Pathology Results   Right hemicolectomy showed invasive well to moderately differentiated adenocarcinoma, 2 foci measuring 7.5 cm and 4.5 cm, tumor invades through full thickness of colon, to the seroma and involve the Small Bowel, Surgical Margins Were Negative, 24 Out Of 64 Lymph Nodes Were Positive, Extracapsular Extension Identified, Multiple Satellite Tumor Deposits Present, Liver Biopsy Showed Metastatic Adenocarcinoma.     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 No BRAF and NRAS mutation. MSI-stable, tumor burden low.   07/06/2016 Tumor Marker   CEA 13.69   07/13/2016 - 01/18/2017 Chemotherapy   mFOLFOX, every 2 weeks, started on 07/14/2015, Avastin added from cycle 3  Oxaliplatin dose to 93m/m2 due to side effects and some cytopenia on 09/21/16  Changed to FOLFIRINOX starting cycle 7 and Reduced  Due to neuropathy hold Oxaliplatin and add Irinotecan with neulasta on day  3 starting with cycle 7 Add low dose Oxaliplatin with cycle 8.  Due to his worsening neuropathy, and good response to chemotherapy, I previously stopped  oxaliplatin from cycle 11, and continue FOLFIRI and avastin      07/27/2016 Tumor Marker   CEA 15.85   09/04/2016 Imaging   Ct C/A/P W Contrast IMPRESSION: Interval right colectomy. Stable small liver metastasis in the inferior right hepatic lobe. Stable mild porta hepatis and aortocaval lymphadenopathy. Stable mild mediastinal lymphadenopathy. No new or progressive metastatic disease identified within the chest, abdomen, or pelvis.   12/19/2016 PET scan   IMPRESSION: 1. Right hemicolectomy, with resolution of the prior hypermetabolic activity inferiorly in the right hepatic lobe, in several mediastinal lymph nodes, and in lymph nodes in the retroperitoneum and porta hepatis. No residual hypermetabolic or enlarged lymph nodes are identified. 2. Low-grade diffuse skeletal metabolic activity is likely therapy related. 3. Coronary atherosclerosis. 4. 3 by 4 mm right middle lobe pulmonary nodule is stable, not appreciably hypermetabolic, but below sensitive PET-CT size thresholds. This may warrant surveillance.   01/11/2017 Imaging   MR Abdomen W WO Contrast IMPRESSION: 1. No acute findings within the abdomen. Previously noted liver metastasis has resolved in the interval. No new lesions.   02/01/2017 - 09/2017 Chemotherapy   Maintenance therapy, Xeloda 2069m (1000 mg/m2)  q12h on day 1-14 every 21 days plus AVASTIN, starting 02/01/2017.  stopped after 12 days due to poor tolerance on 02/14/17  Changed to maintenance 5-FU and avastin every 2 weeks starting on 02/22/17, stopped in 09/2017.      04/10/2017 Imaging   IMPRESSION: 1. Status post right hemicolectomy without findings for recurrent tumor. 2. No worrisome hepatic lesions. Treated disease with only a small residual low attenuation lesion in the right hepatic lobe. 3. No recurrent mediastinal or abdominal lymphadenopathy.    07/09/2017 PET scan   PET 07/09/17 IMPRESSION: 1. Status post right hemicolectomy, without findings of  hypermetabolic recurrent or metastatic disease. 2. A right middle lobe pulmonary nodule is unchanged. However, there is a right lower lobe 5 mm pulmonary nodule which is felt to be new and enlarged compared to prior exams. Suspicious for an isolated pulmonary metastasis. Consider CT follow-up at 3-6 months. 3. Age advanced coronary artery atherosclerosis. Recommend assessment of coronary risk factors and consideration of medical therapy. 4. Borderline ascending aortic dilatation, 4.0 cm     10/10/2017 Imaging   IMPRESSION: 1. Several (at least 10) subcentimeter pulmonary nodules scattered in both lungs, predominantly in the lower lobes, all new/increased, most compatible with enlarging pulmonary metastases, largest 8 mm in the right lower lobe. 2. No additional findings of new or progressive metastatic disease. No recurrent adenopathy. Stable small low-attenuation lesion in the inferior right liver lobe compatible with treated metastasis. No new liver metastases. 3. Stable ectatic 4.0 cm ascending thoracic aorta. Recommend annual imaging followup by CTA or MRA.  4. Stable mild splenomegaly.    10/11/2017 - 12/2017 Chemotherapy   FOLFIRI and Avastin every 2 weeks starting 10/11/17, irnotecan stopped in 12/2017 due to disease progression     01/08/2018 Imaging   01/08/2018 CT CAP IMPRESSION: 1. Progressive hepatic and pulmonary metastatic disease. 2. Ascending Aortic aneurysm NOS (ICD10-I71.9).   01/08/2018 Progression   01/08/2018 CT CAP IMPRESSION: 1. Progressive hepatic and pulmonary metastatic disease. 2. Ascending Aortic aneurysm NOS (ICD10-I71.9).   01/10/2018 - 03/07/2018 Chemotherapy   FOLFOX and Avastin every 2 weeks starting on 01/10/2018. Stopped due to disease progression    03/19/2018 Progression  03/19/2018 CT CAP IMPRESSION: 1. Interval increase in size of dominant nodule within the right lower lobe. There are a few additional nodules which have increased in size.  Multiple additional small bilateral pulmonary nodules are grossly similar. 2. Slight interval increase in size of lesion within the right hepatic lobe. 3. Interval increase in soft tissue at the surgical anastomosis involving the small bowel within the central abdomen. Locally recurrent disease not excluded.    03/21/2018 - 05/15/2018 Chemotherapy   Third lineXeloda 1579m BID 7 days on/7 days off and Avastin every 2 weeksstarting 04/07/18 stopped after 1 cycle and 4 days because he started Clinical trail.    05/22/2018 [facility]   [facility]   LINES AND TUBES: None.  LOWER THORAX: Please see same day CT chest for findings above the diaphragm..Marland Kitchen HEPATOBILIARY: Low-attenuation lesion in segment 5 has minimally increased in size from prior measuring 2.4 x 2.0 cm, previously 2.3 x 1.8 cm when remeasured (8:42). The gallbladder is present and otherwise unremarkable. No biliary dilatation.   SPLEEN: Unremarkable. PANCREAS: Unremarkable.  ADRENALS: Unremarkable. KIDNEYS/URETERS: Unremarkable.  BLADDER: Decompressed making further evaluation difficult. PELVIC/REPRODUCTIVE ORGANS: Unremarkable.  GI TRACT: No dilated or thick walled loops of bowel. Sequelae of right hemicolectomy and appendectomy.   PERITONEUM/RETROPERITONEUM AND MESENTERY: Minimally decreased spiculated mesenteric soft tissue density at the site of enteroenteric anastomosis, now measuring roughly 2.6 x 1.4 cm, previously 3.3 x 3.1 cm (8:54). There is also a mild amount of stranding near the anastomosis within the mid abdomen (8:54)   LYMPH NODES: Borderline enlarged 1.0 cm porta hepatic lymph node, previously 0.7 cm (8:26). VESSELS: The aorta is normal in caliber.  No significant calcified atherosclerotic disease. Though the contrast timing is suboptimal for evaluation of the portal venous system, the portal venous system is appears patent. The hepatic veins and IVC are unremarkable.  BONES AND SOFT  TISSUES: Small fat-containing ventral hernia..   05/24/2018 - 08/02/2018 Chemotherapy   [id number] with palbociclib (Ibrnace) 1069m3 weeks on/1 week off and binimetinib 15 mg BID starting 05/24/18. Stopped 08/02/18 due to disease progression.    08/02/2018 Imaging   [facility]  Since 05/22/2018:  -Unchanged to mildly decreased in size segment 6 hepatic lesion.  -Unchanged irregular soft tissue density at the enteroenteric anastomosis.  -Increase in size of an irregular soft tissue density at the enterocolic anastomosis. This is new since 03/19/2018.  -Mild decrease in size of porta hepatis lymph node.  IMPRESSION:  Slight interval increase in multiple metastatic pulmonary nodules.  New likely malignant small right pleural effusion. Interval prominent right infrahilar lymph nodes; metastasis is in the differential. Mild interlobular septal thickening in the right middle and lower lobes is concerning for lymphangitic tumor spread. Attention on follow-up is suggested.     08/29/2018 Imaging   CT AP 08/29/18 IMPRESSION: 1. No acute CT findings of the abdomen or pelvis to explain abdominal pain.  2. Evidence of worsening metastatic colon malignancy status post right colon resection, including increasing mesenteric nodularity, increasing peritoneal nodularity, enlarging liver metastasis, and new and enlarging pulmonary nodules.  3. Trace perihepatic ascites, of uncertain etiology, although presumably malignant given peritoneal metastatic disease.   09/02/2018 Imaging   CT chest 09/02/18  IMPRESSION: 1. Unfortunately there is interval increase in size of bilateral pulmonary nodules consistent with progression of pulmonary metastasis. 2. New small moderate RIGHT pleural effusion. 3. Interval increase in size of small mediastinal lymph nodes  most consistent with metastatic adenopathy. 4. Interval increase in size of hepatic metastasis in  the inferior RIGHT hepatic lobe compared to CT 03/19/2018.   09/04/2018 - 11/2018 Chemotherapy   oral Regorafenib 66m daily 3 weeks on, 1 week off and IV Nivolumab 332mkg q2weeks starting 09/04/18. Nivo d/c after cycle 2 due to lack of coverage. Will stop week of 11/18/18 due to disease progression.    11/13/2018 Imaging   CT CAP W Constrast  IMPRESSION: 1. There is been interval enlargement of segment 6 liver metastases. Additionally mesenteric tumor at the enterocolonic anastomosis is mildly increased in size from previous exam. Peritoneal deposit along at the umbilicus is mildly increased in size in the interval. New ascites. Findings are concerning for mild progression of disease within the abdomen and pelvis. 2. No significant change in diffuse pulmonary metastases. Stable volume of right pleural effusion. 3. Previous index mesenteric lymph nodes are mildly increased in size in the interval. 4. New small pericardial effusion.    Chemotherapy   PENDING [place]f M-F 2 weeks on/2 weeks off starting in 2 weeks      CURRENT THERAPY:  -oral Regorafenib8066mily3 weeks on, 1 week offand IV Nivolumab 3mg56mq2weeks starting 09/04/18. Nivo d/c after cycle 2 due to lack of coverage. Will stop week of 11/18/18 due to disease progression.  -PENDING Lons[place]f M-F 2 weeks on/2 weeks off starting in 2 weeks   INTERVAL HISTORY:  Nicholas Plasenciahere for a follow up. He presents to the clinic alone. He notes lately he continues to have productive cough with wheezing, mostly in the morning. Green-Yellow phlegm , no fever.  He notes he has had diarrhea with last CT scan yesterday. He notes more abdominal pain at incision site. He was taking 10mg62mFlexeril at a time. He continues 25mcg85mtanyl patch every 2 days. He would need a refill. He is taking oxycodone 20-30mg 353mimes a day.  He has planned to complete last on week of Regorafenib on 11/19/18. He notes he has been taking Erectile  Dysfunction medication but this has not helped him. He plans to talk to a Urologist.  He notes he does feel emotional about his latest disease progression, but feels it was expected at this point.    REVIEW OF SYSTEMS:   Constitutional: Denies fevers, chills or abnormal weight loss Eyes: Denies blurriness of vision Ears, nose, mouth, throat, and face: Denies mucositis or sore throat Respiratory: (+) Productive cough with phlegm, wheezes Cardiovascular: Denies palpitation, chest discomfort or lower extremity swelling Gastrointestinal:  Denies nausea, heartburn (+) diarrhea (+) Midabdominal pain  Skin: Denies abnormal skin rashes MSK: (+) Mid right back pain  Lymphatics: Denies new lymphadenopathy or easy bruising Neurological:Denies numbness, tingling or new weaknesses Behavioral/Psych: Mood is stable, no new changes  All other systems were reviewed with the patient and are negative.  MEDICAL HISTORY:  Past Medical History:  Diagnosis Date  . Anxiety   . Cancer of ascending colon (HCC)   Emeryypertension   . Seasonal allergies     SURGICAL HISTORY: Past Surgical History:  Procedure Laterality Date  . APPENDECTOMY  1992  . CATARACT EXTRACTION W/ INTRAOCULAR LENS IMPLANT Left 04/2016  . COLON SURGERY    . COLONOSCOPY W/ BIOPSIES AND POLYPECTOMY  04/2015  . INGUINAL HERNIA REPAIR Right 1982  . IR RADIOLOGIST EVAL & MGMT  01/16/2017  . KNEE ARTHROSCOPY W/ MENISCECTOMY Right 1998  . LAPAROSCOPIC RIGHT COLECTOMY N/A 06/14/2016   Procedure: LAPAROSCOPIC HAND ASSISTED HEMICOLECTOMY AND  SMALL BOWEL RESECTION.;  Surgeon: [name], MD;  Location: [place];  Service: General;  Laterality: N/A;  . LIVER BIOPSY Right 06/14/2016   Procedure: LIVER BIOPSY;  Surgeon: [name], MD;  Location: [place];  Service: General;  Laterality: Right;  Right Inferior Liver  . PORTACATH PLACEMENT N/A 07/06/2016   Procedure: INSERTION PORT-A-CATH;  Surgeon: [name], MD;  Location: [place];  Service: General;  Laterality: N/A;  . VASECTOMY  2005    I have reviewed the social history and family history with the patient and they are unchanged from previous note.  ALLERGIES:  is allergic to penicillins; xeloda [capecitabine]; and nivolumab.  MEDICATIONS:  Current Outpatient Medications  Medication Sig Dispense Refill  . albuterol (VENTOLIN HFA) 108 (90 Base) MCG/ACT inhaler Inhale 1-2 puffs into the lungs every 6 (six) hours as needed for wheezing or shortness of breath. 1 Inhaler 2  . amLODipine (NORVASC) 10 MG tablet Take 1 tablet (10 mg total) by mouth daily. 30 tablet 5  . cyclobenzaprine (FLEXERIL) 5 MG tablet Take 1 tablet (5 mg total) by mouth 3 (three) times daily as needed for muscle spasms. 30 tablet 0  . dronabinol (MARINOL) 2.5 MG capsule Take 1 capsule (2.5 mg total) by mouth 2 (two) times daily before a meal. 30 capsule 1  . fentaNYL (DURAGESIC) 25 MCG/HR Place 1 patch onto the skin every other day. 15 patch 0  . gabapentin (NEURONTIN) 300 MG capsule TAKE ONE CAPSULE BY MOUTH THREE TIMES DAILY 90 capsule 1  . levofloxacin (LEVAQUIN) 750 MG tablet Take 1 tablet (750 mg total) by mouth daily. 7 tablet 0  . LORazepam (ATIVAN) 1 MG tablet Take 1 tablet (1 mg total) by mouth every 4 (four) hours as needed for anxiety or sleep (nausea). 30 tablet 0  . losartan (COZAAR) 100 MG tablet Take 1 tablet (100 mg total) by mouth daily. 30 tablet 0  . Multiple Vitamin (MULTIVITAMIN WITH MINERALS) TABS tablet Take 1 tablet by mouth daily.    Marland Kitchen omeprazole (PRILOSEC) 20 MG capsule TAKE ONE CAPSULE BY MOUTH TWICE DAILY BEFORE A MEAL (Patient taking differently: Take 20 mg by mouth daily. ) 60 capsule 2  . Oxycodone HCl 10 MG TABS Take 1-2 tablets (10-20 mg total) by mouth every 6 (six) hours as needed. 100 tablet 0  . polyethylene glycol (MIRALAX / GLYCOLAX) 17 g packet Take 17 g by mouth daily. 14 each 0  . prochlorperazine (COMPAZINE) 10 MG tablet Take 1 tablet (10 mg total) by  mouth every 6 (six) hours as needed for nausea or vomiting. 30 tablet 3  . regorafenib (STIVARGA) 40 MG tablet Titrate up to 4 tablets (152m) by mouth once daily as directed. Take for 3 weeks on, 1 week off, repeat every 4 weeks. Take with a low fat and low calorie meal 84 tablet 0  . senna-docusate (SENOKOT-S) 8.6-50 MG tablet Take 2 tablets by mouth 2 (two) times daily.    . sildenafil (REVATIO) 20 MG tablet Take 60-100 mg by mouth See admin instructions. Take 60-100 mg by mouth at least 30 minutes prior to sexual activity    . simethicone (MYLICON) 80 MG chewable tablet Chew 1 tablet (80 mg total) by mouth 4 (four) times daily as needed for flatulence. 30 tablet 0  . venlafaxine XR (EFFEXOR-XR) 150 MG 24 hr capsule Take 1 capsule (150 mg total) by mouth daily with breakfast.    . fentaNYL (DURAGESIC) 50 MCG/HR Place 1 patch  onto the skin every other day. 15 patch 0   No current facility-administered medications for this visit.     PHYSICAL EXAMINATION: ECOG PERFORMANCE STATUS: 1 - Symptomatic but completely ambulatory  Vitals:   11/14/18 1144  BP: (!) 140/93  Pulse: (!) 106  Resp: 18  Temp: 97.7 F (36.5 C)  SpO2: 100%   Filed Weights   11/14/18 1144  Weight: 176 lb 6.4 oz (80 kg)    GENERAL:alert, no distress and comfortable SKIN: skin color, texture, turgor are normal, no rashes or significant lesions EYES: normal, Conjunctiva are pink and non-injected, sclera clear  NECK: supple, thyroid normal size, non-tender, without nodularity LYMPH:  no palpable lymphadenopathy in the cervical, axillary  LUNGS: clear to auscultation and percussion with normal breathing effort HEART: regular rate & rhythm and no murmurs and no lower extremity edema ABDOMEN:abdomen soft, non-tender and normal bowel sounds Musculoskeletal:no cyanosis of digits and no clubbing  NEURO: alert & oriented x 3 with fluent speech, no focal motor/sensory deficits  LABORATORY DATA:  I have reviewed the data as  listed CBC Latest Ref Rng & Units 11/05/2018 10/02/2018 09/18/2018  WBC 4.0 - 10.5 K/uL 6.8 8.3 5.9  Hemoglobin 13.0 - 17.0 g/dL 9.8(L) 12.0(L) 12.5(L)  Hematocrit 39.0 - 52.0 % 30.6(L) 37.0(L) 37.9(L)  Platelets 150 - 400 K/uL 175 232 179     CMP Latest Ref Rng & Units 11/13/2018 10/18/2018 10/02/2018  Glucose 70 - 99 mg/dL 99 126(H) 99  BUN 6 - 20 mg/dL _0 Creatinine 0.61 - 1.24 mg/dL 1.36(H) 1.10 1.00  Sodium 135 - 145 mmol/L 138 140 139  Potassium 3.5 - 5.1 mmol/L 4.3 4.6 4.2  Chloride 98 - 111 mmol/L 107 108 105  CO2 22 - 32 mmol/L 20(L) 24 25  Calcium 8.9 - 10.3 mg/dL 8.2(L) 8.4(L) 9.0  Total Protein 6.5 - 8.1 g/dL 6.8 6.7 6.8  Total Bilirubin 0.3 - 1.2 mg/dL 0.4 0.6 0.6  Alkaline Phos 38 - 126 U/L 75 83 98  AST 15 - 41 U/L 34 35 47(H)  ALT 0 - 44 U/L 24 34 77(H)      RADIOGRAPHIC STUDIES: I have personally reviewed the radiological images as listed and agreed with the findings in the report. Ct Chest W Contrast  Result Date: 11/13/2018 CLINICAL DATA:  Restaging metastatic colon cancer EXAM: CT CHEST, ABDOMEN, AND PELVIS WITH CONTRAST TECHNIQUE: Multidetector CT imaging of the chest, abdomen and pelvis was performed following the standard protocol during bolus administration of intravenous contrast. CONTRAST:  160m OMNIPAQUE IOHEXOL 300 MG/ML  SOLN COMPARISON:  CT chest 09/02/2018 and CT AP 08/29/2018 FINDINGS: CT CHEST FINDINGS Cardiovascular: The heart size appears normal. Small pericardial effusion, new. Lad coronary artery calcifications. Mediastinum/Nodes: Normal appearance of the thyroid gland. The trachea appears patent and is midline. Normal appearance of the esophagus. -index right paratracheal lymph node measures 1.2 cm, image 14/2. Previously 1.0 cm. Index right paratracheal lymph node measures 1 cm, image 16/2. Previously 0.9 cm. Index right pre-vascular node measures 0.9 cm, image 23/2. Previously 0.7 cm. Right hilar node measures 2 cm, image 33/2.  Previously 1.3 cm.  Lungs/Pleura: Similar small to moderate right pleural effusion. Index posteromedial right lower lobe lung nodule measures 1 cm, image 80/6. Previously 0.9 cm. Index superior segment of left lower lobe lung nodule measures 1.9 cm, image 44/6. Unchanged. Index left upper lobe lung nodule measures 0.6 cm, image 69/6. Previously 0.5 cm. Index right upper lobe lung nodule measures 0.6 cm,  image 44/6. Previously 0.7 cm. Index right middle lobe perifissural nodule measures 1 cm, image 91/6. Previously 0.8 cm. Index perifissural right lower lobe lung nodule measures 0.8 cm, image 112/6. Previously 1 cm. Musculoskeletal: No chest wall mass or suspicious bone lesions identified. CT ABDOMEN PELVIS FINDINGS Hepatobiliary: Index lesion within segment 6 measures 4.2 cm, image 72/2. Previously 3.2 cm. New, tiny nonspecific hypodensity along the dome measures 7 mm. Gallbladder normal. No biliary dilatation. Pancreas: Unremarkable. No pancreatic ductal dilatation or surrounding inflammatory changes. Spleen: The spleen is enlarged measuring 15.3 cm in cranial caudal dimension, unchanged. No focal splenic lesion. Adrenals/Urinary Tract: Normal adrenal glands. No kidney mass or hydronephrosis. Urinary bladder normal. Stomach/Bowel: The stomach is nondistended. Status post right hemicolectomy with entero colonic anastomosis. 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 Previously 4.0 x 4.8 cm. No evidence for high-grade bowel obstruction. Vascular/Lymphatic: Normal appearance of the abdominal aorta. Tumor encasement of the ileocolic branches of the superior mesenteric artery and vein identified. There is venous congestion identified within the associated ileocolic mesentery. Portal vein remains patent. Reproductive: Prostate is unremarkable. Other: New small volume ascites identified. Peritoneal implant at the level of the umbilicus measures 2.7 by 1.4 cm, image 84/2. Previously 2.0 x 1.3 cm.  Musculoskeletal: No acute or significant osseous findings. IMPRESSION: 1. There is been interval enlargement of segment 6 liver metastases. Additionally mesenteric tumor at the enterocolonic anastomosis is mildly increased in size from previous exam. Peritoneal deposit along at the umbilicus is mildly increased in size in the interval. New ascites. Findings are concerning for mild progression of disease within the abdomen and pelvis. 2. No significant change in diffuse pulmonary metastases. Stable volume of right pleural effusion. 3. Previous index mesenteric lymph nodes are mildly increased in size in the interval. 4. New small pericardial effusion. Electronically Signed   By: Kerby Moors M.D.   On: 11/13/2018 13:27   Ct Abdomen Pelvis W Contrast  Result Date: 11/13/2018 CLINICAL DATA:  Restaging metastatic colon cancer EXAM: CT CHEST, ABDOMEN, AND PELVIS WITH CONTRAST TECHNIQUE: Multidetector CT imaging of the chest, abdomen and pelvis was performed following the standard protocol during bolus administration of intravenous contrast. CONTRAST:  125m OMNIPAQUE IOHEXOL 300 MG/ML  SOLN COMPARISON:  CT chest 09/02/2018 and CT AP 08/29/2018 FINDINGS: CT CHEST FINDINGS Cardiovascular: The heart size appears normal. Small pericardial effusion, new. Lad coronary artery calcifications. Mediastinum/Nodes: Normal appearance of the thyroid gland. The trachea appears patent and is midline. Normal appearance of the esophagus. -index right paratracheal lymph node measures 1.2 cm, image 14/2. Previously 1.0 cm. Index right paratracheal lymph node measures 1 cm, image 16/2. Previously 0.9 cm. Index right pre-vascular node measures 0.9 cm, image 23/2. Previously 0.7 cm. Right hilar node measures 2 cm, image 33/2.  Previously 1.3 cm. Lungs/Pleura: Similar small to moderate right pleural effusion. Index posteromedial right lower lobe lung nodule measures 1 cm, image 80/6. Previously 0.9 cm. Index superior segment of left lower  lobe lung nodule measures 1.9 cm, image 44/6. Unchanged. Index left upper lobe lung nodule measures 0.6 cm, image 69/6. Previously 0.5 cm. Index right upper lobe lung nodule measures 0.6 cm, image 44/6. Previously 0.7 cm. Index right middle lobe perifissural nodule measures 1 cm, image 91/6. Previously 0.8 cm. Index perifissural right lower lobe lung nodule measures 0.8 cm, image 112/6. Previously 1 cm. Musculoskeletal: No chest wall mass or suspicious bone lesions identified. CT ABDOMEN PELVIS FINDINGS Hepatobiliary: Index lesion within segment 6 measures  4.2 cm, image 72/2. Previously 3.2 cm. New, tiny nonspecific hypodensity along the dome measures 7 mm. Gallbladder normal. No biliary dilatation. Pancreas: Unremarkable. No pancreatic ductal dilatation or surrounding inflammatory changes. Spleen: The spleen is enlarged measuring 15.3 cm in cranial caudal dimension, unchanged. No focal splenic lesion. Adrenals/Urinary Tract: Normal adrenal glands. No kidney mass or hydronephrosis. Urinary bladder normal. Stomach/Bowel: The stomach is nondistended. Status post right hemicolectomy with entero colonic anastomosis. 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 Previously 4.0 x 4.8 cm. No evidence for high-grade bowel obstruction. Vascular/Lymphatic: Normal appearance of the abdominal aorta. Tumor encasement of the ileocolic branches of the superior mesenteric artery and vein identified. There is venous congestion identified within the associated ileocolic mesentery. Portal vein remains patent. Reproductive: Prostate is unremarkable. Other: New small volume ascites identified. Peritoneal implant at the level of the umbilicus measures 2.7 by 1.4 cm, image 84/2. Previously 2.0 x 1.3 cm. Musculoskeletal: No acute or significant osseous findings. IMPRESSION: 1. There is been interval enlargement of segment 6 liver metastases. Additionally mesenteric tumor at the enterocolonic anastomosis is  mildly increased in size from previous exam. Peritoneal deposit along at the umbilicus is mildly increased in size in the interval. New ascites. Findings are concerning for mild progression of disease within the abdomen and pelvis. 2. No significant change in diffuse pulmonary metastases. Stable volume of right pleural effusion. 3. Previous index mesenteric lymph nodes are mildly increased in size in the interval. 4. New small pericardial effusion. Electronically Signed   By: Kerby Moors M.D.   On: 11/13/2018 13:27     ASSESSMENT & PLAN:  Nicholas Lowery is a 58 y.o. male with   1. Right colon cancer with liver, node and lung metastasis, pT4bN2bM1, stage IV, MSI-stable, KRAS G12D mutation (+) -He was diagnosed in 04/2015. He is s/p right hemicolectomy due to bowel obstruction -He has had multiple line chemo, including FOLFOX, FOLFIRINOX, FOLFIRI, maintenance 5-FU, Xeloda andAvastin(briefly), and [id number] 3 weeks on/1 week off andbinimetinib 15 mgBID starting 1/10/20withDr. LTruman Hayward-Unfortunatelyhe [facility] and developed severe abdominal pain secondary to peritoneal metastasis.  -He proceeded with4thline therapy withRegorafeniband Nivo based on the REGONIVO clinical trialdata(JCO 37(15), 2019).He started on 09/04/18. He was tolerating well.  -He was not able to gain coverage for Nivolamab, so we canceled after cycle 2 and he continued Regorafenib and titrated dose up -We discussed his CT CAP from 11/13/18 which shows disease progression of liver mets, pulmonary nodules, mesenteric tumor and lymph nodes, and peritoneal deposit near umbilicus. There is also new small ascites. Overall Regorafenib is no longer controlling his disease, will finish this week then stop due to disease progression.  -I discussed next-line treatment with oral Lonsurf M-F 2 weeks on, 2 weeks off or restart oral Xeloda and Avastin. He previously  had Xeloda, so I recommend him to start Lonsurf first. I reviewed possible side effects of fatigue, low blood counts, mild nausea and diarrhea. This is overall tolerable. He is agreeable proceeding with Lonsurf. Plan to start in 1-2 weeks when he receives it.  -Unfortunately there is no other standard therapy beyond these options. He is interested in clinical trials if available. I will start searching trial options.  -Labs reviewed from yesterday, CMP showed Cr 1.36 which is now, likely secondary to dehydration. With CT contrast and bad diarrhea yesterday, I am concerned about worsening renal function, will given IV Fluids today. I strongly encouraged him to drink a  lot of water. He is agreeable.  -Repeat labs next week and more IVFs if needed.  -Pt is concerned about his ED.I recommend he see urologist for his ED medication not working for him. I discussed the cause can be multifactorial.  -F/u open   2. Productive cough and wheezing -Has Green-Yellow phlegm, Afebrile  -11/05/18 Chest Xray showed small right pleural effusion, no infectious process.  -NP Lacie previously prescribed a course of Levaquin -his symptoms are likely secondary to lung metastasis, but will do COVID-19 testing. He is agreeable.  -CT CAP shows mild progression in lung with inflammation at base of lungs secondary to pleural infusion and possible infection.    3. Upper abdominal pain  -For the lastfewweeks he has had constant upper abdominal pain radiating across base of ribcage with occasion cramping (4-8/10).  -Iagainadvised him to avoid alcohol consumption as this can further effect his liver and ability to continue treatment. He is willing to reduce his alcohol intake. -CT AP from 08/29/18 showed worsening metastatic colon malignancy and mesenteric and peritoneal nodularity. -His worsening pain is related to his disease progression exacerbated by his anxiety and depression.He wasrecently hospitalized forpain  management.  -He is no longer on Dilaudid. I do not plan to refill in the future.  -Ipreviouslyincreasedhis Gabapentin to356m at night to help him sleep. (09/04/18) -He has flexeril for any muscular pain. He is currently taking two tabs at a time (118m. -He still has constant right flank pain, midabdominal pain, and right lower chest pain with deep breathing.  -With more disease progression, he has been needing slightly more oxycodone pain medication. Starting 11/13/18 will increase Fentanyl to 5069mevery 2 days, 3 days if tolerable. Will continue 25m53mycodone two tabs TID as needed for break through pain.   4. Depression, Anxiety, Insomnia -Pt has long-standing history of depression, has been on Prozac for decades, it was very well controlled in the past.  -He has good family and social support. -He has become more depressedagainwith disease progressionand stress of prognosis.  -Ipreviouslyoffered chance to speak with our [facility].He declined for now. -He denies suicidal or self harm thought or ideology. -He is sleeping better now that pain is controlled.  -I encouraged him not to stress about work and to speak to someone and release emotions.  -Stable mood, he has declined counseling for now.  -He feels with latest disease progression he feels this is expected, but still very emotional. He is willing to talk to SW AEnderlinffexor and Ativan.He has been taking Gabapentin TID  5. Hypertension  -OnAmlodipineand Lisinopril, I refilled today -He does not want to follow-up with his primary care physician. -Will monitor asRegorafenibmay increase his blood pressure. -BP at140/93today (11/14/18),stable.I encouraged him to monitor at home.  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  -Continueoral iron supplements  7. Constipation  -likely secondary to  pain medication -He only had 1 BMevery 2-3 days. He takes Miralax every 1-2 days to avoid diarrhea.  -I encouraged him to take Miralax half dose daily. He can takeMilk of magnesium or magnesium citrate for severe constipation.  8. Anorexia and weight loss  -Secondary to underlying malignancy -He continues to have weight loss especially with worsened abdominal pain and disease progression.  -His appetiteand weight continues to drop. He has lost 10 pounds in the last month.  -I encouraged him to try Marinol more and to  make sure he eats increased carbohydrates in diet.  -I encourage him to follow up with Dietician Barbara  9. Goal of care discussion, on DNR/DNI -The patient understands the goal of care is palliative, and his overall prognosis is very poor.  -Henotes he hasupdatedhis will to include a DNR.  10. Right knee arthritis  -He was offered total knee replacement by orthopedist but declined due to poor prognosis.  -He will continue to get injections in knee. Pain improved with cortisone shot and the pain medication he is on.  -Not mentioned today, pain likely well controlled   11. Memory decline  -He has concerned with recent declined in memory or foggy brain. He does not want this to effects his job.  -I discussed his narcotic use, Effexor and Marinol can all effect his brain.  -I encouraged him to take on a lighter load at work and to not do overtime.  -Will monitor closely.    Plan -CT CAP reviewed, unfortunately he has had disease progression -f/u with SW Edwyna Shell for counseling  -I refilled oxycodone today, increased Fentanyl to 33mg every 2 days -I prescribed Lonsurf today to start in the next 2 weeks -Cr from yesterday at 1.36. Will give IV Fluids today  -COVID-19 testing  -Continue Regorafenib until week of 11/18/18 -lab, and IVF early next week -lab and f/u in 3 weeks    No problem-specific Assessment & Plan notes found for this encounter.    No orders of the defined types were placed in this encounter.  All questions were answered. The patient knows to call the clinic with any problems, questions or concerns. No barriers to learning was detected. I spent 30 minutes counseling the patient face to face. The total time spent in the appointment was 40 minutes and more than 50% was on counseling and review of test results     YTruitt Merle MD 11/14/2018   I, AJoslyn Devon am acting as scribe for YTruitt Merle MD.   I have reviewed the above documentation for accuracy and completeness, and I agree with the above.

## 2018-11-13 ENCOUNTER — Other Ambulatory Visit: Payer: Self-pay

## 2018-11-13 ENCOUNTER — Encounter (HOSPITAL_COMMUNITY): Payer: Self-pay

## 2018-11-13 ENCOUNTER — Inpatient Hospital Stay: Payer: BC Managed Care – PPO | Attending: Hematology

## 2018-11-13 ENCOUNTER — Ambulatory Visit (HOSPITAL_COMMUNITY)
Admission: RE | Admit: 2018-11-13 | Discharge: 2018-11-13 | Disposition: A | Discharge status: Home / Self Care | Payer: BC Managed Care – PPO | Source: Ambulatory Visit | Attending: Hematology | Admitting: Hematology

## 2018-11-13 DIAGNOSIS — R630 Anorexia: Secondary | ICD-10-CM | POA: Insufficient documentation

## 2018-11-13 DIAGNOSIS — C787 Secondary malignant neoplasm of liver and intrahepatic bile duct: Secondary | ICD-10-CM | POA: Diagnosis not present

## 2018-11-13 DIAGNOSIS — C7800 Secondary malignant neoplasm of unspecified lung: Secondary | ICD-10-CM | POA: Insufficient documentation

## 2018-11-13 DIAGNOSIS — Z7951 Long term (current) use of inhaled steroids: Secondary | ICD-10-CM | POA: Insufficient documentation

## 2018-11-13 DIAGNOSIS — Z87891 Personal history of nicotine dependence: Secondary | ICD-10-CM | POA: Insufficient documentation

## 2018-11-13 DIAGNOSIS — R634 Abnormal weight loss: Secondary | ICD-10-CM | POA: Diagnosis not present

## 2018-11-13 DIAGNOSIS — G893 Neoplasm related pain (acute) (chronic): Secondary | ICD-10-CM | POA: Insufficient documentation

## 2018-11-13 DIAGNOSIS — C786 Secondary malignant neoplasm of retroperitoneum and peritoneum: Secondary | ICD-10-CM | POA: Insufficient documentation

## 2018-11-13 DIAGNOSIS — C189 Malignant neoplasm of colon, unspecified: Secondary | ICD-10-CM

## 2018-11-13 DIAGNOSIS — J90 Pleural effusion, not elsewhere classified: Secondary | ICD-10-CM | POA: Diagnosis not present

## 2018-11-13 DIAGNOSIS — F419 Anxiety disorder, unspecified: Secondary | ICD-10-CM | POA: Diagnosis not present

## 2018-11-13 DIAGNOSIS — K5900 Constipation, unspecified: Secondary | ICD-10-CM | POA: Diagnosis not present

## 2018-11-13 DIAGNOSIS — D509 Iron deficiency anemia, unspecified: Secondary | ICD-10-CM | POA: Insufficient documentation

## 2018-11-13 DIAGNOSIS — I10 Essential (primary) hypertension: Secondary | ICD-10-CM | POA: Insufficient documentation

## 2018-11-13 DIAGNOSIS — R600 Localized edema: Secondary | ICD-10-CM | POA: Diagnosis not present

## 2018-11-13 DIAGNOSIS — E860 Dehydration: Secondary | ICD-10-CM | POA: Insufficient documentation

## 2018-11-13 DIAGNOSIS — C182 Malignant neoplasm of ascending colon: Secondary | ICD-10-CM | POA: Insufficient documentation

## 2018-11-13 DIAGNOSIS — I313 Pericardial effusion (noninflammatory): Secondary | ICD-10-CM | POA: Diagnosis not present

## 2018-11-13 DIAGNOSIS — R062 Wheezing: Secondary | ICD-10-CM | POA: Diagnosis not present

## 2018-11-13 DIAGNOSIS — R180 Malignant ascites: Secondary | ICD-10-CM | POA: Diagnosis not present

## 2018-11-13 DIAGNOSIS — Z9221 Personal history of antineoplastic chemotherapy: Secondary | ICD-10-CM | POA: Insufficient documentation

## 2018-11-13 DIAGNOSIS — F329 Major depressive disorder, single episode, unspecified: Secondary | ICD-10-CM | POA: Insufficient documentation

## 2018-11-13 LAB — CMP (CANCER CENTER ONLY)
ALT: 24 U/L (ref 0–44)
AST: 34 U/L (ref 15–41)
Albumin: 3.7 g/dL (ref 3.5–5.0)
Alkaline Phosphatase: 75 U/L (ref 38–126)
Anion gap: 11 (ref 5–15)
BUN: 19 mg/dL (ref 6–20)
CO2: 20 mmol/L — ABNORMAL LOW (ref 22–32)
Calcium: 8.2 mg/dL — ABNORMAL LOW (ref 8.9–10.3)
Chloride: 107 mmol/L (ref 98–111)
Creatinine: 1.36 mg/dL — ABNORMAL HIGH (ref 0.61–1.24)
GFR, Est AFR Am: >60 mL/min (ref >60)
GFR, Estimated: 58 mL/min — ABNORMAL LOW (ref >60)
Glucose, Bld: 99 mg/dL (ref 70–99)
Potassium: 4.3 mmol/L (ref 3.5–5.1)
Sodium: 138 mmol/L (ref 135–145)
Total Bilirubin: 0.4 mg/dL (ref 0.3–1.2)
Total Protein: 6.8 g/dL (ref 6.5–8.1)

## 2018-11-13 MED ORDER — SODIUM CHLORIDE (PF) 0.9 % IJ SOLN
INTRAMUSCULAR | Status: AC
  Filled 2018-11-13: qty 50

## 2018-11-13 MED ORDER — IOHEXOL 300 MG/ML  SOLN
100.0000 mL | Freq: Once | INTRAMUSCULAR | Status: AC | PRN
  Administered 2018-11-13: 11:00:00 100 mL via INTRAVENOUS

## 2018-11-14 ENCOUNTER — Inpatient Hospital Stay (HOSPITAL_BASED_OUTPATIENT_CLINIC_OR_DEPARTMENT_OTHER): Payer: BC Managed Care – PPO | Admitting: Hematology

## 2018-11-14 ENCOUNTER — Telehealth: Payer: Self-pay | Admitting: Hematology

## 2018-11-14 ENCOUNTER — Ambulatory Visit: Payer: Self-pay | Admitting: *Deleted

## 2018-11-14 ENCOUNTER — Inpatient Hospital Stay: Payer: BC Managed Care – PPO

## 2018-11-14 ENCOUNTER — Other Ambulatory Visit: Payer: Self-pay

## 2018-11-14 ENCOUNTER — Telehealth: Payer: Self-pay

## 2018-11-14 VITALS — BP 134/98 | HR 90 | Resp 18

## 2018-11-14 VITALS — BP 140/93 | HR 106 | Temp 97.7°F | Resp 18 | Ht 70.0 in | Wt 176.4 lb

## 2018-11-14 DIAGNOSIS — C787 Secondary malignant neoplasm of liver and intrahepatic bile duct: Secondary | ICD-10-CM | POA: Diagnosis not present

## 2018-11-14 DIAGNOSIS — C182 Malignant neoplasm of ascending colon: Secondary | ICD-10-CM | POA: Diagnosis not present

## 2018-11-14 DIAGNOSIS — K5900 Constipation, unspecified: Secondary | ICD-10-CM

## 2018-11-14 DIAGNOSIS — I10 Essential (primary) hypertension: Secondary | ICD-10-CM

## 2018-11-14 DIAGNOSIS — T451X5A Adverse effect of antineoplastic and immunosuppressive drugs, initial encounter: Secondary | ICD-10-CM

## 2018-11-14 DIAGNOSIS — C189 Malignant neoplasm of colon, unspecified: Secondary | ICD-10-CM

## 2018-11-14 DIAGNOSIS — G620 Drug-induced polyneuropathy: Secondary | ICD-10-CM

## 2018-11-14 DIAGNOSIS — Z20822 Contact with and (suspected) exposure to covid-19: Secondary | ICD-10-CM

## 2018-11-14 DIAGNOSIS — Z95828 Presence of other vascular implants and grafts: Secondary | ICD-10-CM

## 2018-11-14 DIAGNOSIS — C7800 Secondary malignant neoplasm of unspecified lung: Secondary | ICD-10-CM

## 2018-11-14 DIAGNOSIS — C786 Secondary malignant neoplasm of retroperitoneum and peritoneum: Secondary | ICD-10-CM

## 2018-11-14 DIAGNOSIS — G893 Neoplasm related pain (acute) (chronic): Secondary | ICD-10-CM

## 2018-11-14 DIAGNOSIS — D500 Iron deficiency anemia secondary to blood loss (chronic): Secondary | ICD-10-CM

## 2018-11-14 DIAGNOSIS — E860 Dehydration: Secondary | ICD-10-CM

## 2018-11-14 MED ORDER — OXYCODONE HCL 10 MG PO TABS: 10.0000 mg | ORAL_TABLET | Freq: Four times a day (QID) | ORAL | 0 refills | Status: DC | PRN

## 2018-11-14 MED ORDER — SODIUM CHLORIDE 0.9 % IV SOLN
INTRAVENOUS | Status: AC
  Administered 2018-11-14: 16:00:00 via INTRAVENOUS
  Filled 2018-11-14: qty 250

## 2018-11-14 MED ORDER — SODIUM CHLORIDE 0.9% FLUSH
10.0000 mL | Freq: Once | INTRAVENOUS | Status: AC
  Administered 2018-11-14: 10 mL
  Filled 2018-11-14: qty 10

## 2018-11-14 MED ORDER — FENTANYL 50 MCG/HR TD PT72: 1.0000 | MEDICATED_PATCH | TRANSDERMAL | 0 refills | Status: DC

## 2018-11-14 MED ORDER — HEPARIN SOD (PORK) LOCK FLUSH 100 UNIT/ML IV SOLN
500.0000 [IU] | Freq: Once | INTRAVENOUS | Status: AC
  Administered 2018-11-14: 500 [IU]
  Filled 2018-11-14: qty 5

## 2018-11-14 NOTE — Patient Instructions (Signed)
Dehydration, Adult  Dehydration is when there is not enough fluid or water in your body. This happens when you lose more fluids than you take in. Dehydration can range from mild to very bad. It should be treated right away to keep it from getting very bad. Symptoms of mild dehydration may include:  Thirst.  Dry lips.  Slightly dry mouth.  Dry, warm skin.  Dizziness. Symptoms of moderate dehydration may include:  Very dry mouth.  Muscle cramps.  Dark pee (urine). Pee may be the color of tea.  Your body making less pee.  Your eyes making fewer tears.  Heartbeat that is uneven or faster than normal (palpitations).  Headache.  Light-headedness, especially when you stand up from sitting.  Fainting (syncope). Symptoms of very bad dehydration may include:  Changes in skin, such as: ? Cold and clammy skin. ? Blotchy (mottled) or pale skin. ? Skin that does not quickly return to normal after being lightly pinched and let go (poor skin turgor).  Changes in body fluids, such as: ? Feeling very thirsty. ? Your eyes making fewer tears. ? Not sweating when body temperature is high, such as in hot weather. ? Your body making very little pee.  Changes in vital signs, such as: ? Weak pulse. ? Pulse that is more than 100 beats a minute when you are sitting still. ? Fast breathing. ? Low blood pressure.  Other changes, such as: ? Sunken eyes. ? Cold hands and feet. ? Confusion. ? Lack of energy (lethargy). ? Trouble waking up from sleep. ? Short-term weight loss. ? Unconsciousness. Follow these instructions at home:   If told by your doctor, drink an ORS: ? Make an ORS by using instructions on the package. ? Start by drinking small amounts, about  cup (120 mL) every 5-10 minutes. ? Slowly drink more until you have had the amount that your doctor said to have.  Drink enough clear fluid to keep your pee clear or pale yellow. If you were told to drink an ORS, finish the  ORS first, then start slowly drinking clear fluids. Drink fluids such as: ? Water. Do not drink only water by itself. Doing that can make the salt (sodium) level in your body get too low (hyponatremia). ? Ice chips. ? Fruit juice that you have added water to (diluted). ? Low-calorie sports drinks.  Avoid: ? Alcohol. ? Drinks that have a lot of sugar. These include high-calorie sports drinks, fruit juice that does not have water added, and soda. ? Caffeine. ? Foods that are greasy or have a lot of fat or sugar.  Take over-the-counter and prescription medicines only as told by your doctor.  Do not take salt tablets. Doing that can make the salt level in your body get too high (hypernatremia).  Eat foods that have minerals (electrolytes). Examples include bananas, oranges, potatoes, tomatoes, and spinach.  Keep all follow-up visits as told by your doctor. This is important. Contact a doctor if:  You have belly (abdominal) pain that: ? Gets worse. ? Stays in one area (localizes).  You have a rash.  You have a stiff neck.  You get angry or annoyed more easily than normal (irritability).  You are more sleepy than normal.  You have a harder time waking up than normal.  You feel: ? Weak. ? Dizzy. ? Very thirsty.  You have peed (urinated) only a small amount of very dark pee during 6-8 hours. Get help right away if:  You have   symptoms of very bad dehydration.  You cannot drink fluids without throwing up (vomiting).  Your symptoms get worse with treatment.  You have a fever.  You have a very bad headache.  You are throwing up or having watery poop (diarrhea) and it: ? Gets worse. ? Does not go away.  You have blood or something green (bile) in your throw-up.  You have blood in your poop (stool). This may cause poop to look black and tarry.  You have not peed in 6-8 hours.  You pass out (faint).  Your heart rate when you are sitting still is more than 100 beats a  minute.  You have trouble breathing. This information is not intended to replace advice given to you by your health care provider. Make sure you discuss any questions you have with your health care provider. Document Released: 02/25/2009 Document Revised: 04/13/2017 Document Reviewed: 06/25/2015 Elsevier Patient Education  2020 Elsevier Inc.  

## 2018-11-14 NOTE — Telephone Encounter (Signed)
Pt returned call to be scheduled for COVID-19 testing.   Order was entered by Linus Orn, RN earlier when she tried to contact him.  I scheduled him for November 19, 2018 at 8:00 at the [facility] location in [place].  I made him aware to stay in the car and wear a mask.  Insur:  BC/BS

## 2018-11-14 NOTE — Telephone Encounter (Signed)
Scheduled appt per 7/2 los. Spoke with patient and he is aware of his appt date and time.

## 2018-11-14 NOTE — Progress Notes (Signed)
Spoke with Magda Paganini RN to get patient COVID testing today, due to productive cough, she will call him on his cell phone and get him into the drive through testing at [facility].

## 2018-11-14 NOTE — Telephone Encounter (Signed)
Nicholas Lowery from [facility] calling to request COVID 19 test for symptoms. Dr. Truitt Merle. Message left to call back and schedule testing. Order placed.

## 2018-11-15 ENCOUNTER — Encounter: Payer: Self-pay | Admitting: Hematology

## 2018-11-15 ENCOUNTER — Ambulatory Visit: Payer: BC Managed Care – PPO

## 2018-11-15 MED ORDER — TRIFLURIDINE-TIPIRACIL 20-8.19 MG PO TABS: ORAL_TABLET | ORAL | 0 refills | Status: DC

## 2018-11-18 ENCOUNTER — Ambulatory Visit: Payer: BC Managed Care – PPO

## 2018-11-18 ENCOUNTER — Other Ambulatory Visit: Payer: Self-pay | Admitting: Hematology

## 2018-11-18 ENCOUNTER — Telehealth: Payer: Self-pay | Admitting: Pharmacist

## 2018-11-18 ENCOUNTER — Encounter: Payer: Self-pay | Admitting: Hematology

## 2018-11-18 ENCOUNTER — Telehealth: Payer: Self-pay

## 2018-11-18 ENCOUNTER — Other Ambulatory Visit: Payer: Self-pay | Admitting: Nurse Practitioner

## 2018-11-18 ENCOUNTER — Other Ambulatory Visit: Payer: BC Managed Care – PPO

## 2018-11-18 DIAGNOSIS — C189 Malignant neoplasm of colon, unspecified: Secondary | ICD-10-CM

## 2018-11-18 DIAGNOSIS — I10 Essential (primary) hypertension: Secondary | ICD-10-CM

## 2018-11-18 DIAGNOSIS — R030 Elevated blood-pressure reading, without diagnosis of hypertension: Secondary | ICD-10-CM

## 2018-11-18 MED ORDER — TRIFLURIDINE-TIPIRACIL 20-8.19 MG PO TABS: ORAL_TABLET | ORAL | 0 refills | Status: AC

## 2018-11-18 NOTE — Telephone Encounter (Signed)
Oral Oncology Patient Advocate Encounter  Received notification from [facility] that prior authorization for [place] is required.  PA submitted on CoverMyMeds Key [id number] Status is pending  Oral Oncology Clinic will continue to follow.  [place] Patient [place] Phone [phone] Fax [phone] 11/18/2018    9:19 AM

## 2018-11-18 NOTE — Telephone Encounter (Signed)
Oral Oncology Patient Advocate Encounter  Prior Authorization for Frankey Poot has been approved.    PA# [id number] Effective dates: 11/18/18 through 11/18/19  Oral Oncology Clinic will continue to follow.   [place] Patient [place] Phone [phone] Fax [phone] 11/18/2018    11:57 AM

## 2018-11-18 NOTE — Telephone Encounter (Signed)
Oral Oncology Pharmacist Encounter  Received new prescription for Lonsurf (trifluridine/tipiracil) for the treatment of previously treated metastatic colon cancer, Kras mutation positive, planned duration until disease progression or unacceptable toxicity  Lonsurf is planned to be administered at ~35 mg/m2 by mouth 2 times daily (80 mg trifluridine in AM & 60 mg trifluridine in PM), taken on days 1-5 & 8-12 of each 28 day cycle  Labs from Epic assessed, OK for treatment initiation. 7/1 SCr=1.36, est CrCl ~ 65 mL/min  Current medication list in Epic reviewed, no DDIs with Lonsurf identified.  Prescription has been e-scribed to the [facility] for benefits analysis and approval.  Oral Oncology Clinic will continue to follow for insurance authorization, copayment issues, initial counseling and start date.  Johny Drilling, PharmD, BCPS, BCOP  11/18/2018 8:11 AM Oral Oncology Clinic [phone]

## 2018-11-18 NOTE — Telephone Encounter (Signed)
Oral Oncology Pharmacist Encounter  Received notification that insurance authorization of Nicholas Lowery has been approved. Lonsurf must be filled at [facility] per insurance requirement.  Lonsurf prescription has been e-scribed to mandated dispensing pharmacy.  We will update patient on dispensing pharmacy and perform initial counseling once we verify prescription receipt and processing at [facility].  Johny Drilling, PharmD, BCPS, BCOP  11/18/2018 12:32 PM Oral Oncology Clinic [phone]

## 2018-11-19 ENCOUNTER — Other Ambulatory Visit: Payer: Self-pay

## 2018-11-19 ENCOUNTER — Other Ambulatory Visit: Payer: Self-pay | Admitting: Medical

## 2018-11-19 ENCOUNTER — Inpatient Hospital Stay (HOSPITAL_BASED_OUTPATIENT_CLINIC_OR_DEPARTMENT_OTHER): Payer: BC Managed Care – PPO | Admitting: Medical

## 2018-11-19 ENCOUNTER — Other Ambulatory Visit: Payer: Self-pay | Admitting: Nurse Practitioner

## 2018-11-19 ENCOUNTER — Inpatient Hospital Stay: Payer: BC Managed Care – PPO

## 2018-11-19 VITALS — BP 114/82 | HR 91 | Temp 97.8°F | Resp 18

## 2018-11-19 DIAGNOSIS — Z20822 Contact with and (suspected) exposure to covid-19: Secondary | ICD-10-CM

## 2018-11-19 DIAGNOSIS — C189 Malignant neoplasm of colon, unspecified: Secondary | ICD-10-CM

## 2018-11-19 DIAGNOSIS — R062 Wheezing: Secondary | ICD-10-CM | POA: Diagnosis not present

## 2018-11-19 DIAGNOSIS — C787 Secondary malignant neoplasm of liver and intrahepatic bile duct: Secondary | ICD-10-CM | POA: Diagnosis not present

## 2018-11-19 DIAGNOSIS — J189 Pneumonia, unspecified organism: Secondary | ICD-10-CM

## 2018-11-19 DIAGNOSIS — C182 Malignant neoplasm of ascending colon: Secondary | ICD-10-CM | POA: Diagnosis not present

## 2018-11-19 DIAGNOSIS — E860 Dehydration: Secondary | ICD-10-CM

## 2018-11-19 DIAGNOSIS — Z95828 Presence of other vascular implants and grafts: Secondary | ICD-10-CM

## 2018-11-19 LAB — CMP (CANCER CENTER ONLY)
ALT: 18 U/L (ref 0–44)
AST: 24 U/L (ref 15–41)
Albumin: 3.6 g/dL (ref 3.5–5.0)
Alkaline Phosphatase: 75 U/L (ref 38–126)
Anion gap: 9 (ref 5–15)
BUN: 19 mg/dL (ref 6–20)
CO2: 22 mmol/L (ref 22–32)
Calcium: 8.5 mg/dL — ABNORMAL LOW (ref 8.9–10.3)
Chloride: 107 mmol/L (ref 98–111)
Creatinine: 1.38 mg/dL — ABNORMAL HIGH (ref 0.61–1.24)
GFR, Est AFR Am: >60 mL/min (ref >60)
GFR, Estimated: 57 mL/min — ABNORMAL LOW (ref >60)
Glucose, Bld: 101 mg/dL — ABNORMAL HIGH (ref 70–99)
Potassium: 4.4 mmol/L (ref 3.5–5.1)
Sodium: 138 mmol/L (ref 135–145)
Total Bilirubin: 0.4 mg/dL (ref 0.3–1.2)
Total Protein: 6.8 g/dL (ref 6.5–8.1)

## 2018-11-19 LAB — TOTAL PROTEIN, URINE DIPSTICK: Protein, ur: NEGATIVE mg/dL

## 2018-11-19 LAB — CEA (IN HOUSE-CHCC): CEA (CHCC-In House): 78.74 ng/mL — ABNORMAL HIGH (ref 0.00–5.00)

## 2018-11-19 MED ORDER — SULFAMETHOXAZOLE-TRIMETHOPRIM 800-160 MG PO TABS: 1.0000 | ORAL_TABLET | Freq: Two times a day (BID) | ORAL | 0 refills | Status: DC

## 2018-11-19 MED ORDER — HEPARIN SOD (PORK) LOCK FLUSH 100 UNIT/ML IV SOLN
500.0000 [IU] | Freq: Once | INTRAVENOUS | Status: AC
  Administered 2018-11-19: 500 [IU]
  Filled 2018-11-19: qty 5

## 2018-11-19 MED ORDER — CYCLOBENZAPRINE HCL 5 MG PO TABS: 5.0000 mg | ORAL_TABLET | Freq: Three times a day (TID) | ORAL | 0 refills | Status: DC | PRN

## 2018-11-19 MED ORDER — SODIUM CHLORIDE 0.9 % IV SOLN
Freq: Once | INTRAVENOUS | Status: AC
  Administered 2018-11-19: 10:00:00 via INTRAVENOUS
  Filled 2018-11-19: qty 250

## 2018-11-19 MED ORDER — SODIUM CHLORIDE 0.9% FLUSH
10.0000 mL | Freq: Once | INTRAVENOUS | Status: AC
  Administered 2018-11-19: 10 mL
  Filled 2018-11-19: qty 10

## 2018-11-19 MED ORDER — FLOVENT HFA 110 MCG/ACT IN AERO: 2.0000 | INHALATION_SPRAY | Freq: Two times a day (BID) | RESPIRATORY_TRACT | 12 refills | Status: AC

## 2018-11-19 MED ORDER — CYCLOBENZAPRINE HCL 5 MG PO TABS: 5.0000 mg | ORAL_TABLET | Freq: Three times a day (TID) | ORAL | 0 refills | Status: AC | PRN

## 2018-11-19 MED ORDER — SODIUM CHLORIDE 0.9 % IV SOLN
INTRAVENOUS | Status: DC
  Filled 2018-11-19: qty 250

## 2018-11-19 NOTE — Telephone Encounter (Signed)
[name] please advise patient MyChart request.

## 2018-11-19 NOTE — Telephone Encounter (Signed)
Oral Oncology Patient Advocate Encounter  I called [facility] to follow up on the [facility] prescription. They do not need anything from [place] at this time. The prescription is in processing with the pharmacy but they do not have copay information at this time.  Once the prescription is processed the patient will receive a call from them to go over copay information and schedule shipment.  Oral oncology clinic will continue to follow.  [place] Patient [place] Phone [phone] Fax [phone] 11/19/2018   9:28 AM

## 2018-11-19 NOTE — Telephone Encounter (Signed)
I refilled and increased quantity.  Thanks, Regan Rakers NP

## 2018-11-19 NOTE — Patient Instructions (Signed)
Rehydration, Adult Rehydration is the replacement of body fluids and salts and minerals (electrolytes) that are lost during dehydration. Dehydration is when there is not enough fluid or water in the body. This happens when you lose more fluids than you take in. Common causes of dehydration include:  Vomiting.  Diarrhea.  Excessive sweating, such as from heat exposure or exercise.  Taking medicines that cause the body to lose excess fluid (diuretics).  Impaired kidney function.  Not drinking enough fluid.  Certain illnesses or infections.  Certain poorly controlled long-term (chronic) illnesses, such as diabetes, heart disease, and kidney disease.  Symptoms of mild dehydration may include thirst, dry lips and mouth, dry skin, and dizziness. Symptoms of severe dehydration may include increased heart rate, confusion, fainting, and not urinating. You can rehydrate by drinking certain fluids or getting fluids through an IV tube, as told by your health care provider. What are the risks? Generally, rehydration is safe. However, one problem that can happen is taking in too much fluid (overhydration). This is rare. If overhydration happens, it can cause an electrolyte imbalance, kidney failure, or a decrease in salt (sodium) levels in the body. How to rehydrate Follow instructions from your health care provider for rehydration. The kind of fluid you should drink and the amount you should drink depend on your condition.  If directed by your health care provider, drink an oral rehydration solution (ORS). This is a drink designed to treat dehydration that is found in pharmacies and retail stores. ? Make an ORS by following instructions on the package. ? Start by drinking small amounts, about  cup (120 mL) every 5-10 minutes. ? Slowly increase how much you drink until you have taken the amount recommended by your health care provider.  Drink enough clear fluids to keep your urine clear or pale  yellow. If you were instructed to drink an ORS, finish the ORS first, then start slowly drinking other clear fluids. Drink fluids such as: ? Water. Do not drink only water. Doing that can lead to having too little sodium in your body (hyponatremia). ? Ice chips. ? Fruit juice that you have added water to (diluted juice). ? Low-calorie sports drinks.  If you are severely dehydrated, your health care provider may recommend that you receive fluids through an IV tube in the hospital.  Do not take sodium tablets. Doing that can lead to the condition of having too much sodium in your body (hypernatremia). Eating while you rehydrate Follow instructions from your health care provider about what to eat while you rehydrate. Your health care provider may recommend that you slowly begin eating regular foods in small amounts.  Eat foods that contain a healthy balance of electrolytes, such as bananas, oranges, potatoes, tomatoes, and spinach.  Avoid foods that are greasy or contain a lot of fat or sugar.  In some cases, you may get nutrition through a feeding tube that is passed through your nose and into your stomach (nasogastric tube, or NG tube). This may be done if you have uncontrolled vomiting or diarrhea. Beverages to avoid Certain beverages may make dehydration worse. While you rehydrate, avoid:  Alcohol.  Caffeine.  Drinks that contain a lot of sugar. These include: ? High-calorie sports drinks. ? Fruit juice that is not diluted. ? Soda.  Check nutrition labels to see how much sugar or caffeine a beverage contains. Signs of dehydration recovery You may be recovering from dehydration if:  You are urinating more often than before you started   rehydrating.  Your urine is clear or pale yellow.  Your energy level improves.  You vomit less frequently.  You have diarrhea less frequently.  Your appetite improves or returns to normal.  You feel less dizzy or less light-headed.  Your  skin tone and color start to look more normal. Contact a health care provider if:  You continue to have symptoms of mild dehydration, such as: ? Thirst. ? Dry lips. ? Slightly dry mouth. ? Dry, warm skin. ? Dizziness.  You continue to vomit or have diarrhea. Get help right away if:  You have symptoms of dehydration that get worse.  You feel: ? Confused. ? Weak. ? Like you are going to faint.  You have not urinated in 6-8 hours.  You have very dark urine.  You have trouble breathing.  Your heart rate while sitting still is over 100 beats a minute.  You cannot drink fluids without vomiting.  You have vomiting or diarrhea that: ? Gets worse. ? Does not go away.  You have a fever. This information is not intended to replace advice given to you by your health care provider. Make sure you discuss any questions you have with your health care provider. Document Released: 07/24/2011 Document Revised: 04/13/2017 Document Reviewed: 06/25/2015 Elsevier Patient Education  2020 [employer]  Coronavirus (COVID-19) Are you at risk?  Are you at risk for the Coronavirus (COVID-19)?  To be considered HIGH RISK for Coronavirus (COVID-19), you have to meet the following criteria:  . Traveled to [place], [place], [place], [place] or [place]; or in the [place] to [place], [place], [place], or [place]; and have fever, cough, and shortness of breath within the last 2 weeks of travel OR . Been in close contact with a person diagnosed with COVID-19 within the last 2 weeks and have fever, cough, and shortness of breath . IF YOU DO NOT MEET THESE CRITERIA, YOU ARE CONSIDERED LOW RISK FOR COVID-19.  What to do if you are HIGH RISK for COVID-19?  . If you are having a medical emergency, call 911. . Seek medical care right away. Before you go to a doctor's office, urgent care or emergency department, call ahead and tell them about your recent travel, contact with someone diagnosed  with COVID-19, and your symptoms. You should receive instructions from your physician's office regarding next steps of care.  . When you arrive at healthcare provider, tell the healthcare staff immediately you have returned from visiting [place], [place], [place], [place] or [place]; or traveled in the [place] to [place], [place], [place], or [place]; in the last two weeks or you have been in close contact with a person diagnosed with COVID-19 in the last 2 weeks.   . Tell the health care staff about your symptoms: fever, cough and shortness of breath. . After you have been seen by a medical provider, you will be either: o Tested for (COVID-19) and discharged home on quarantine except to seek medical care if symptoms worsen, and asked to  - Stay home and avoid contact with others until you get your results (4-5 days)  - Avoid travel on public transportation if possible (such as bus, train, or airplane) or o Sent to the Emergency Department by EMS for evaluation, COVID-19 testing, and possible admission depending on your condition and test results.  What to do if you are LOW RISK for COVID-19?  Reduce your risk of any infection by using the same precautions   used for avoiding the common cold or flu:  . Wash your hands often with soap and warm water for at least 20 seconds.  If soap and water are not readily available, use an alcohol-based hand sanitizer with at least 60% alcohol.  . If coughing or sneezing, cover your mouth and nose by coughing or sneezing into the elbow areas of your shirt or coat, into a tissue or into your sleeve (not your hands). . Avoid shaking hands with others and consider head nods or verbal greetings only. . Avoid touching your eyes, nose, or mouth with unwashed hands.  . Avoid close contact with people who are sick. . Avoid places or events with large numbers of people in one location, like concerts or sporting events. . Carefully consider travel plans you have  or are making. . If you are planning any travel outside or inside the [place], visit the CDC's Travelers' Health webpage for the latest health notices. . If you have some symptoms but not all symptoms, continue to monitor at home and seek medical attention if your symptoms worsen. . If you are having a medical emergency, call 911.   ADDITIONAL HEALTHCARE OPTIONS FOR PATIENTS  [facility] / e-Visit: [email or web address]         [facility]: [phone]  [facility]: [phone]                   [facility]: [phone]   

## 2018-11-20 ENCOUNTER — Other Ambulatory Visit: Payer: Self-pay | Admitting: Hematology

## 2018-11-20 ENCOUNTER — Other Ambulatory Visit: Payer: Self-pay | Admitting: Medical

## 2018-11-20 ENCOUNTER — Telehealth: Payer: Self-pay | Admitting: Medical

## 2018-11-20 ENCOUNTER — Telehealth: Payer: Self-pay

## 2018-11-20 DIAGNOSIS — C189 Malignant neoplasm of colon, unspecified: Secondary | ICD-10-CM

## 2018-11-20 NOTE — Telephone Encounter (Signed)
-----   Message from Truitt Merle, MD sent at 11/19/2018 11:24 AM EDT ----- Let pt know his lab results, Cr stable, still slightly elevated, will monitor, encourage pt to keep hydrated, thanks  Truitt Merle  11/19/2018

## 2018-11-20 NOTE — Telephone Encounter (Signed)
Oral Oncology Patient Advocate Encounter  I called [employer] and gave them the [employer] card information. They still do not have a copay yet as it is in benefits verification. I informed [facility] that the patient is needing to start on 7/13 and asked them to expedite the process, the representative stated he would put that request in.  [facility] [place] Phone [phone] Fax [phone] 11/20/2018   4:15 PM

## 2018-11-20 NOTE — Telephone Encounter (Signed)
Oral Oncology Pharmacist Encounter  [facility] Patient Support copayment coupon for Lonsurf  ID: [id number] BID: [id number] PCN: [id number] Group: [id number] Activation date: 11/20/2018  Johny Drilling, PharmD, BCPS, BCOP  11/20/2018 3:43 PM [facility] [phone]

## 2018-11-20 NOTE — Telephone Encounter (Signed)
Oral Chemotherapy Pharmacist Encounter     I spoke with patient for overview of: Lonsurf (trifluridine/tipiracil) for the treatment of previously treated metastatic colon cancer, Kras mutation positive, planned durationuntil disease progression or unacceptable toxicity.    Counseled patient on administration, dosing, side effects, monitoring, drug-food interactions, safe handling, storage, and disposal.   Patient will take Lonsurf 20 mg (trifluridine component) tablets, 4 tablets (5m trifluridine) by mouth in AM and 3 tablets (64mtrifluridine) in PM, within 1 hour hour of finishing meals, on days 1-5 and days 8-12, every 28 days.  Patient plans to take Lonsurf M-F, Saturday and Sunday off days, Lonsurf M-F again, then no tablets for the next 2 weeks. Patient will start D1 of each cycle on Mondays.  Lonsurf start date: TBD, planning for 11/25/18 Last Stiv[place]a dose was on 11/13/2018   Adverse effects include but are not limited to: fatigue, nausea, vomiting, diarrhea, and decreased blood counts.    Patient has anti-emetic on hand and knows to take it if nausea develops.   Patient will obtain anti diarrheal and alert the office of 4 or more loose stools above baseline.  Patient updated about CBC check on Cycle 1 Day 14.   Reviewed with patient importance of keeping a medication schedule and plan for any missed doses.   Medication reconciliation performed and medication/allergy list updated.  Insurance authorization for [place] been obtained. [facility]f prescription must be filled at [facility] [place]. We will follow-up with dispensing pharmacy to ensure timely dispensing of Lonsurf. I have secured a manufacturer copayment card to cover out of pocket expenses for the [place] will be shared with pharmacy.  Patient is feeling better since receiving fluids yesterday. He is still experiencing productive cough. He is awaiting  COVID-19 test results.  All questions answered.  Mr. CaMcmanamonoiced understanding and appreciation.    Patient knows to call the office with questions or concerns.   JeJohny DrillingPharmD, BCPS, BCOP  11/20/2018   3:29 PM Oral Oncology Clinic [phone]

## 2018-11-20 NOTE — Telephone Encounter (Signed)
Left voice message for patient regarding lab results, per [name] informed him Creatinine is stable still slightly elevated, encouraged him to keep well hydrated.  Asked that he call back if he has any questions.

## 2018-11-20 NOTE — Telephone Encounter (Signed)
No los per 7/7. °

## 2018-11-21 NOTE — Progress Notes (Signed)
Sympt[facility] Management Clinic Progress Note   Nicholas Lowery 04/28/1961 58 y.o.  Nicholas Lowery is managed by [name]   Actively treated with chemotherapy/immunotherapy/hormonal therapy: yes  Current therapy: PENDING Lonsurf M-F 2 weeks on/2 weeks off starting in 2 weeks  Next scheduled appointment with provider: 12/05/2018  Assessment: Plan:    Metastatic colon cancer: The patient is currently awaiting Lonsurf.  He is scheduled to see [name] in follow-up on 12/05/2018.  Wheezing: The patient continues on an albuterol inhaler.  He was given a prescription for Flovent inhaler today.  Please see After Visit Summary for patient specific instructions.  Future Appointments  Date Time Provider [place]  12/05/2018  7:45 AM [facility] None  12/05/2018  8:15 AM Truitt Merle, MD [facility] None    No orders of the defined types were placed in this encounter.      Subjective:   Patient ID:  Nicholas Lowery is a 58 y.o. (DOB [birth year]) male.  Chief Complaint: No chief complaint on file.   HPI Nicholas Lowery is a 58 year old male with a history of a metastatic colon cancer with liver mets.  He is awaiting the start of Lonsurf.  He was seen in clinic today with a report that he continues to have ongoing pulmonary wheezing.  He has been using an albuterol inhaler without resolution of his symptoms.  He reports having a cough with dark sputum in the morning.  He denies fevers, chills, sweats, headaches, or acute pain.  Medications: I have reviewed the patient's current medications.  Allergies:  Allergies  Allergen Reactions  . Penicillins Other (See Comments)    UNSPECIFIED REACTION   Has patient had a PCN reaction causing immediate rash, facial/tongue/throat swelling, SOB or lightheadedness with hypotension: unknown Has patient had a PCN reaction causing severe rash involving mucus membranes or skin necrosis: unknown  Has patient had a PCN reaction that required hospitalization unknown Has patient had a PCN reaction occurring within the last 10 years: No If all of the above answers are "NO", then may proceed with Cephalosporin use.   [name] [Capecitabine] Nausea And Vomiting and Other (See Comments)    Foot pain, patient couldn't walk, and bleeding gums  . Nivolumab Nausea And Vomiting, Palpitations and Other (See Comments)    Infusion reaction: Chills, rigors, numbness. Symptoms improved with ondansetron, clonidine, famotidine, meperidine    Past Medical History:  Diagnosis Date  . Anxiety   . Cancer of ascending colon ([place])   . Hypertension   . Seasonal allergies     Past Surgical History:  Procedure Laterality Date  . APPENDECTOMY  1992  . CATARACT EXTRACTION W/ INTRAOCULAR LENS IMPLANT Left 04/2016  . COLON SURGERY    . COLONOSCOPY W/ BIOPSIES AND POLYPECTOMY  04/2015  . INGUINAL HERNIA REPAIR Right 1982  . IR RADIOLOGIST EVAL & MGMT  01/16/2017  . KNEE ARTHROSCOPY W/ MENISCECTOMY Right 1998  . LAPAROSCOPIC RIGHT COLECTOMY N/A 06/14/2016   Procedure: LAPAROSCOPIC HAND ASSISTED HEMICOLECTOMY AND SMALL BOWEL RESECTION.;  Surgeon: [name], MD;  Location: [facility];  Service: General;  Laterality: N/A;  . LIVER BIOPSY Right 06/14/2016   Procedure: LIVER BIOPSY;  Surgeon: [name], MD;  Location: [facility];  Service: General;  Laterality: Right;  Right Inferior Liver  . PORTACATH PLACEMENT N/A 07/06/2016   Procedure: INSERTION PORT-A-CATH;  Surgeon: [name], MD;  Location: [facility];  Service: General;  Laterality: N/A;  . VASECTOMY  2005  Family History  Problem Relation Age of Onset  . Cancer Mother        lung cancer  . Stroke Mother   . Hypertension Father   . CAD Father   . Cancer Maternal Grandfather        prostate cancer     Social History   Socioeconomic History  . Marital status: Married    Spouse name: Not on file  . Number of children: Not on  file  . Years of education: Not on file  . Highest education level: Not on file  Occupational History  . Not on file  Social Needs  . Financial resource strain: Not on file  . Food insecurity    Worry: Not on file    Inability: Not on file  . Transportation needs    Medical: Not on file    Non-medical: Not on file  Tobacco Use  . Smoking status: Former Smoker    Years: 2.00    Types: Cigarettes    Quit date: 1990    Years since quitting: 30.5  . Smokeless tobacco: Never Used  Substance and Sexual Activity  . Alcohol use: Yes    Alcohol/week: 20.0 standard drinks    Types: 20 Cans of beer per week    Comment: pt states he drinks about 3-4 beers per night  . Drug use: No    Comment: 06/15/2016 "nothing since college"  . Sexual activity: Yes  Lifestyle  . Physical activity    Days per week: Not on file    Minutes per session: Not on file  . Stress: Not on file  Relationships  . Social Herbalist on phone: Not on file    Gets together: Not on file    Attends religious service: Not on file    Active member of club or organization: Not on file    Attends meetings of clubs or organizations: Not on file    Relationship status: Not on file  . Intimate partner violence    Fear of current or ex partner: Not on file    Emotionally abused: Not on file    Physically abused: Not on file    Forced sexual activity: Not on file  Other Topics Concern  . Not on file  Social History Narrative  . Not on file    Past Medical History, Surgical history, Social history, and Family history were reviewed and updated as appropriate.   Please see review of systems for further details on the patient's review from today.   Review of Systems:  Review of Systems  Constitutional: Negative for chills, diaphoresis and fever.  HENT: Negative for trouble swallowing and voice change.   Respiratory: Positive for wheezing. Negative for cough, chest tightness and shortness of breath.    Cardiovascular: Negative for chest pain and palpitations.  Gastrointestinal: Negative for abdominal pain, constipation, diarrhea, nausea and vomiting.  Musculoskeletal: Negative for back pain and myalgias.  Neurological: Negative for dizziness, light-headedness and headaches.    Objective:   Physical Exam:  There were no vitals taken for this visit. ECOG: 1  Physical Exam Constitutional:      General: He is not in acute distress.    Appearance: He is not diaphoretic.  HENT:     Head: Normocephalic and atraumatic.  Cardiovascular:     Rate and Rhythm: Normal rate and regular rhythm.     Heart sounds: Normal heart sounds. No murmur. No friction rub. No gallop.  Pulmonary:     Effort: Pulmonary effort is normal. No respiratory distress.     Breath sounds: Examination of the left-lower field reveals decreased breath sounds. Decreased breath sounds present. No wheezing or rales.  Skin:    General: Skin is warm and dry.     Findings: No erythema or rash.  Neurological:     Mental Status: He is alert.     Lab Review:     Component Value Date/Time   NA 138 11/19/2018 0854   NA 139 05/17/2017 0931   K 4.4 11/19/2018 0854   K 4.9 05/17/2017 0931   CL 107 11/19/2018 0854   CO2 22 11/19/2018 0854   CO2 27 05/17/2017 0931   GLUCOSE 101 (H) 11/19/2018 0854   GLUCOSE 86 05/17/2017 0931   BUN 19 11/19/2018 0854   BUN 16.8 05/17/2017 0931   CREATININE 1.38 (H) 11/19/2018 0854   CREATININE 1.1 05/17/2017 0931   CALCIUM 8.5 (L) 11/19/2018 0854   CALCIUM 9.8 05/17/2017 0931   PROT 6.8 11/19/2018 0854   PROT 7.0 05/17/2017 0931   ALBUMIN 3.6 11/19/2018 0854   ALBUMIN 4.1 05/17/2017 0931   AST 24 11/19/2018 0854   AST 17 05/17/2017 0931   ALT 18 11/19/2018 0854   ALT 16 05/17/2017 0931   ALKPHOS 75 11/19/2018 0854   ALKPHOS 44 05/17/2017 0931   BILITOT 0.4 11/19/2018 0854   BILITOT 0.57 05/17/2017 0931   GFRNONAA 57 (L) 11/19/2018 0854   GFRAA >60 11/19/2018 0854        Component Value Date/Time   WBC 6.8 11/05/2018 0918   WBC 8.3 10/02/2018 0900   RBC 3.31 (L) 11/05/2018 0918   HGB 9.8 (L) 11/05/2018 0918   HGB 13.4 05/17/2017 0931   HCT 30.6 (L) 11/05/2018 0918   HCT 40.5 05/17/2017 0931   PLT 175 11/05/2018 0918   PLT 119 (L) 05/17/2017 0931   MCV 92.4 11/05/2018 0918   MCV 96.9 05/17/2017 0931   MCH 29.6 11/05/2018 0918   MCHC 32.0 11/05/2018 0918   RDW 14.6 11/05/2018 0918   RDW 15.3 (H) 05/17/2017 0931   LYMPHSABS 0.6 (L) 11/05/2018 0918   LYMPHSABS 0.7 (L) 05/17/2017 0931   MONOABS 0.9 11/05/2018 0918   MONOABS 0.5 05/17/2017 0931   EOSABS 0.9 (H) 11/05/2018 0918   EOSABS 0.1 05/17/2017 0931   BASOSABS 0.1 11/05/2018 0918   BASOSABS 0.0 05/17/2017 0931   -------------------------------  Imaging from last 24 hours (if applicable):  Radiology interpretation: Dg Chest 2 View  Result Date: 11/05/2018 CLINICAL DATA:  History of metastatic colon cancer. Cough for 2-3 months. EXAM: CHEST - 2 VIEW COMPARISON:  CT chest 09/02/2018.  PA and lateral chest 02/24/2017. FINDINGS: The patient has a small right pleural effusion and basilar atelectasis. Scattered pulmonary nodules are identified, most prominent in the right lower lobe. No pneumothorax. No consolidative process. Heart size is normal. No acute or focal bony abnormality. IMPRESSION: Small right pleural effusion and pulmonary nodules. The appearance is not markedly changed compared to the prior chest CT. Electronically Signed   By: Inge Rise M.D.   On: 11/05/2018 08:58   Ct Chest W Contrast  Result Date: 11/13/2018 CLINICAL DATA:  Restaging metastatic colon cancer EXAM: CT CHEST, ABDOMEN, AND PELVIS WITH CONTRAST TECHNIQUE: Multidetector CT imaging of the chest, abdomen and pelvis was performed following the standard protocol during bolus administration of intravenous contrast. CONTRAST:  187mL OMNIPAQUE IOHEXOL 300 MG/ML  SOLN COMPARISON:  CT chest 09/02/2018 and CT  AP 08/29/2018  FINDINGS: CT CHEST FINDINGS Cardiovascular: The heart size appears normal. Small pericardial effusion, new. Lad coronary artery calcifications. Mediastinum/Nodes: Normal appearance of the thyroid gland. The trachea appears patent and is midline. Normal appearance of the esophagus. -index right paratracheal lymph node measures 1.2 cm, image 14/2. Previously 1.0 cm. Index right paratracheal lymph node measures 1 cm, image 16/2. Previously 0.9 cm. Index right pre-vascular node measures 0.9 cm, image 23/2. Previously 0.7 cm. Right hilar node measures 2 cm, image 33/2.  Previously 1.3 cm. Lungs/Pleura: Similar small to moderate right pleural effusion. Index posteromedial right lower lobe lung nodule measures 1 cm, image 80/6. Previously 0.9 cm. Index superior segment of left lower lobe lung nodule measures 1.9 cm, image 44/6. Unchanged. Index left upper lobe lung nodule measures 0.6 cm, image 69/6. Previously 0.5 cm. Index right upper lobe lung nodule measures 0.6 cm, image 44/6. Previously 0.7 cm. Index right middle lobe perifissural nodule measures 1 cm, image 91/6. Previously 0.8 cm. Index perifissural right lower lobe lung nodule measures 0.8 cm, image 112/6. Previously 1 cm. Musculoskeletal: No chest wall mass or suspicious bone lesions identified. CT ABDOMEN PELVIS FINDINGS Hepatobiliary: Index lesion within segment 6 measures 4.2 cm, image 72/2. Previously 3.2 cm. New, tiny nonspecific hypodensity along the dome measures 7 mm. Gallbladder normal. No biliary dilatation. Pancreas: Unremarkable. No pancreatic ductal dilatation or surrounding inflammatory changes. Spleen: The spleen is enlarged measuring 15.3 cm in cranial caudal dimension, unchanged. No focal splenic lesion. Adrenals/Urinary Tract: Normal adrenal glands. No kidney mass or hydronephrosis. Urinary bladder normal. Stomach/Bowel: The stomach is nondistended. Status post right hemicolectomy with entero colonic anastomosis. Central mesenteric tumor at the  anastomosis measures 5.6 x 7.1 cm, image 53/4. Previously 4.0 x 4.8 cm. No evidence for high-grade bowel obstruction. Vascular/Lymphatic: Normal appearance of the abdominal aorta. Tumor encasement of the ileocolic branches of the superior mesenteric artery and vein identified. There is venous congestion identified within the associated ileocolic mesentery. Portal vein remains patent. Reproductive: Prostate is unremarkable. Other: New small volume ascites identified. Peritoneal implant at the level of the umbilicus measures 2.7 by 1.4 cm, image 84/2. Previously 2.0 x 1.3 cm. Musculoskeletal: No acute or significant osseous findings. IMPRESSION: 1. There is been interval enlargement of segment 6 liver metastases. Additionally mesenteric tumor at the enterocolonic anastomosis is mildly increased in size from previous exam. Peritoneal deposit along at the umbilicus is mildly increased in size in the interval. New ascites. Findings are concerning for mild progression of disease within the abdomen and pelvis. 2. No significant change in diffuse pulmonary metastases. Stable volume of right pleural effusion. 3. Previous index mesenteric lymph nodes are mildly increased in size in the interval. 4. New small pericardial effusion. Electronically Signed   By: Kerby Moors M.D.   On: 11/13/2018 13:27   Ct Abdomen Pelvis W Contrast  Result Date: 11/13/2018 CLINICAL DATA:  Restaging metastatic colon cancer EXAM: CT CHEST, ABDOMEN, AND PELVIS WITH CONTRAST TECHNIQUE: Multidetector CT imaging of the chest, abdomen and pelvis was performed following the standard protocol during bolus administration of intravenous contrast. CONTRAST:  146mL OMNIPAQUE IOHEXOL 300 MG/ML  SOLN COMPARISON:  CT chest 09/02/2018 and CT AP 08/29/2018 FINDINGS: CT CHEST FINDINGS Cardiovascular: The heart size appears normal. Small pericardial effusion, new. Lad coronary artery calcifications. Mediastinum/Nodes: Normal appearance of the thyroid gland. The  trachea appears patent and is midline. Normal appearance of the esophagus. -index right paratracheal lymph node measures 1.2 cm, image 14/2. Previously 1.0 cm. Index right  paratracheal lymph node measures 1 cm, image 16/2. Previously 0.9 cm. Index right pre-vascular node measures 0.9 cm, image 23/2. Previously 0.7 cm. Right hilar node measures 2 cm, image 33/2.  Previously 1.3 cm. Lungs/Pleura: Similar small to moderate right pleural effusion. Index posteromedial right lower lobe lung nodule measures 1 cm, image 80/6. Previously 0.9 cm. Index superior segment of left lower lobe lung nodule measures 1.9 cm, image 44/6. Unchanged. Index left upper lobe lung nodule measures 0.6 cm, image 69/6. Previously 0.5 cm. Index right upper lobe lung nodule measures 0.6 cm, image 44/6. Previously 0.7 cm. Index right middle lobe perifissural nodule measures 1 cm, image 91/6. Previously 0.8 cm. Index perifissural right lower lobe lung nodule measures 0.8 cm, image 112/6. Previously 1 cm. Musculoskeletal: No chest wall mass or suspicious bone lesions identified. CT ABDOMEN PELVIS FINDINGS Hepatobiliary: Index lesion within segment 6 measures 4.2 cm, image 72/2. Previously 3.2 cm. New, tiny nonspecific hypodensity along the dome measures 7 mm. Gallbladder normal. No biliary dilatation. Pancreas: Unremarkable. No pancreatic ductal dilatation or surrounding inflammatory changes. Spleen: The spleen is enlarged measuring 15.3 cm in cranial caudal dimension, unchanged. No focal splenic lesion. Adrenals/Urinary Tract: Normal adrenal glands. No kidney mass or hydronephrosis. Urinary bladder normal. Stomach/Bowel: The stomach is nondistended. Status post right hemicolectomy with entero colonic anastomosis. 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 Previously 4.0 x 4.8 cm. No evidence for high-grade bowel obstruction. Vascular/Lymphatic: Normal appearance of the abdominal aorta. Tumor encasement of the  ileocolic branches of the superior mesenteric artery and vein identified. There is venous congestion identified within the associated ileocolic mesentery. Portal vein remains patent. Reproductive: Prostate is unremarkable. Other: New small volume ascites identified. Peritoneal implant at the level of the umbilicus measures 2.7 by 1.4 cm, image 84/2. Previously 2.0 x 1.3 cm. Musculoskeletal: No acute or significant osseous findings. IMPRESSION: 1. There is been interval enlargement of segment 6 liver metastases. Additionally mesenteric tumor at the enterocolonic anastomosis is mildly increased in size from previous exam. Peritoneal deposit along at the umbilicus is mildly increased in size in the interval. New ascites. Findings are concerning for mild progression of disease within the abdomen and pelvis. 2. No significant change in diffuse pulmonary metastases. Stable volume of right pleural effusion. 3. Previous index mesenteric lymph nodes are mildly increased in size in the interval. 4. New small pericardial effusion. Electronically Signed   By: Kerby Moors M.D.   On: 11/13/2018 13:27

## 2018-11-21 NOTE — Telephone Encounter (Signed)
Oral Oncology Pharmacist Encounter  I called [facility] to follow-up on status of patient's Lonsurf prescription. Lonsurf prescription had been canceled at the pharmacy due to a new prescription of Stiv[place]a being sent to the pharmacy by the office on 11/20/2018.  Pharmacy informed that Marylyn Ishihara has been discontinued, last dose on 11/13/2018. Lonsurf prescription has now been reactivated.  Pharmacy requested to expedite Lonsurf prescription as the hopeful start date of Lonsurf is 11/25/2018, which is this coming Monday.  Pharmacy informed that insurance authorization for Frankey Poot has already been obtained. Pharmacy informed that they already have [place] co-pay card information on file since this was provided to the pharmacy yesterday.  Representative stated that we should follow-up with the pharmacy tomorrow morning for a status check on the Lonsurf.  Johny Drilling, PharmD, BCPS, BCOP  11/21/2018 2:50 PM Oral Oncology Clinic [phone]

## 2018-11-22 NOTE — Telephone Encounter (Signed)
Oral Oncology Pharmacist Encounter  I called [facility] to follow-up on status of patient's Lonsurf prescription. Representative was able to expedite it out of insurance verification stage and into funding stage. I again provided information for Lonsurf co-pay card and funding team was able to apply that to his balance due. Lonsurf prescription is now ready to be scheduled with patient for delivery for copayment of $0.  I called and left voicemail for patient with above information as well as request that patient call dispensing pharmacy to schedule his first shipment of his Lonsurf. Phone number to the pharmacy is [phone]. I also left direct contact phone number for oral oncology clinic if patient should have any additional questions or concerns.  Johny Drilling, PharmD, BCPS, BCOP  11/22/2018 11:28 AM Oral Oncology Clinic [phone]

## 2018-11-24 ENCOUNTER — Inpatient Hospital Stay (HOSPITAL_COMMUNITY): Payer: BC Managed Care – PPO

## 2018-11-24 ENCOUNTER — Other Ambulatory Visit: Payer: Self-pay

## 2018-11-24 ENCOUNTER — Inpatient Hospital Stay (HOSPITAL_COMMUNITY)
Admission: EM | Admit: 2018-11-24 | Discharge: 2018-11-27 | DRG: 871 | Disposition: A | Discharge status: Home / Self Care | Payer: BC Managed Care – PPO | Attending: Internal Medicine | Admitting: Internal Medicine

## 2018-11-24 ENCOUNTER — Emergency Department (HOSPITAL_COMMUNITY): Payer: BC Managed Care – PPO

## 2018-11-24 ENCOUNTER — Encounter (HOSPITAL_COMMUNITY): Payer: Self-pay

## 2018-11-24 DIAGNOSIS — F329 Major depressive disorder, single episode, unspecified: Secondary | ICD-10-CM | POA: Diagnosis present

## 2018-11-24 DIAGNOSIS — G894 Chronic pain syndrome: Secondary | ICD-10-CM | POA: Diagnosis present

## 2018-11-24 DIAGNOSIS — Z1159 Encounter for screening for other viral diseases: Secondary | ICD-10-CM | POA: Diagnosis not present

## 2018-11-24 DIAGNOSIS — R6520 Severe sepsis without septic shock: Secondary | ICD-10-CM

## 2018-11-24 DIAGNOSIS — C189 Malignant neoplasm of colon, unspecified: Secondary | ICD-10-CM | POA: Diagnosis not present

## 2018-11-24 DIAGNOSIS — Z801 Family history of malignant neoplasm of trachea, bronchus and lung: Secondary | ICD-10-CM

## 2018-11-24 DIAGNOSIS — Z888 Allergy status to other drugs, medicaments and biological substances status: Secondary | ICD-10-CM

## 2018-11-24 DIAGNOSIS — K219 Gastro-esophageal reflux disease without esophagitis: Secondary | ICD-10-CM | POA: Diagnosis present

## 2018-11-24 DIAGNOSIS — C7801 Secondary malignant neoplasm of right lung: Secondary | ICD-10-CM | POA: Diagnosis present

## 2018-11-24 DIAGNOSIS — Z7951 Long term (current) use of inhaled steroids: Secondary | ICD-10-CM | POA: Diagnosis not present

## 2018-11-24 DIAGNOSIS — C19 Malignant neoplasm of rectosigmoid junction: Secondary | ICD-10-CM | POA: Diagnosis present

## 2018-11-24 DIAGNOSIS — Z66 Do not resuscitate: Secondary | ICD-10-CM | POA: Diagnosis present

## 2018-11-24 DIAGNOSIS — Z87891 Personal history of nicotine dependence: Secondary | ICD-10-CM | POA: Diagnosis not present

## 2018-11-24 DIAGNOSIS — D630 Anemia in neoplastic disease: Secondary | ICD-10-CM | POA: Diagnosis present

## 2018-11-24 DIAGNOSIS — K921 Melena: Secondary | ICD-10-CM | POA: Diagnosis present

## 2018-11-24 DIAGNOSIS — Z79899 Other long term (current) drug therapy: Secondary | ICD-10-CM

## 2018-11-24 DIAGNOSIS — C787 Secondary malignant neoplasm of liver and intrahepatic bile duct: Secondary | ICD-10-CM | POA: Diagnosis present

## 2018-11-24 DIAGNOSIS — N179 Acute kidney failure, unspecified: Secondary | ICD-10-CM | POA: Diagnosis present

## 2018-11-24 DIAGNOSIS — R509 Fever, unspecified: Secondary | ICD-10-CM | POA: Diagnosis present

## 2018-11-24 DIAGNOSIS — J90 Pleural effusion, not elsewhere classified: Secondary | ICD-10-CM

## 2018-11-24 DIAGNOSIS — Z8249 Family history of ischemic heart disease and other diseases of the circulatory system: Secondary | ICD-10-CM

## 2018-11-24 DIAGNOSIS — Z823 Family history of stroke: Secondary | ICD-10-CM | POA: Diagnosis not present

## 2018-11-24 DIAGNOSIS — Y95 Nosocomial condition: Secondary | ICD-10-CM | POA: Diagnosis present

## 2018-11-24 DIAGNOSIS — Z880 Allergy status to penicillin: Secondary | ICD-10-CM | POA: Diagnosis not present

## 2018-11-24 DIAGNOSIS — D509 Iron deficiency anemia, unspecified: Secondary | ICD-10-CM | POA: Diagnosis present

## 2018-11-24 DIAGNOSIS — Z8042 Family history of malignant neoplasm of prostate: Secondary | ICD-10-CM

## 2018-11-24 DIAGNOSIS — I10 Essential (primary) hypertension: Secondary | ICD-10-CM | POA: Diagnosis present

## 2018-11-24 DIAGNOSIS — J189 Pneumonia, unspecified organism: Secondary | ICD-10-CM | POA: Diagnosis present

## 2018-11-24 DIAGNOSIS — Z79891 Long term (current) use of opiate analgesic: Secondary | ICD-10-CM

## 2018-11-24 DIAGNOSIS — Z9842 Cataract extraction status, left eye: Secondary | ICD-10-CM

## 2018-11-24 DIAGNOSIS — F419 Anxiety disorder, unspecified: Secondary | ICD-10-CM | POA: Diagnosis present

## 2018-11-24 DIAGNOSIS — A419 Sepsis, unspecified organism: Principal | ICD-10-CM | POA: Diagnosis present

## 2018-11-24 DIAGNOSIS — Z9221 Personal history of antineoplastic chemotherapy: Secondary | ICD-10-CM

## 2018-11-24 DIAGNOSIS — I712 Thoracic aortic aneurysm, without rupture: Secondary | ICD-10-CM | POA: Diagnosis present

## 2018-11-24 DIAGNOSIS — Z9049 Acquired absence of other specified parts of digestive tract: Secondary | ICD-10-CM

## 2018-11-24 DIAGNOSIS — Z9889 Other specified postprocedural states: Secondary | ICD-10-CM

## 2018-11-24 LAB — COMPREHENSIVE METABOLIC PANEL
ALT: 22 U/L (ref 0–44)
AST: 27 U/L (ref 15–41)
Albumin: 3.2 g/dL — ABNORMAL LOW (ref 3.5–5.0)
Alkaline Phosphatase: 70 U/L (ref 38–126)
Anion gap: 8 (ref 5–15)
BUN: 35 mg/dL — ABNORMAL HIGH (ref 6–20)
CO2: 19 mmol/L — ABNORMAL LOW (ref 22–32)
Calcium: 8.3 mg/dL — ABNORMAL LOW (ref 8.9–10.3)
Chloride: 107 mmol/L (ref 98–111)
Creatinine, Ser: 1.85 mg/dL — ABNORMAL HIGH (ref 0.61–1.24)
GFR calc Af Amer: 46 mL/min — ABNORMAL LOW (ref >60)
GFR calc non Af Amer: 40 mL/min — ABNORMAL LOW (ref >60)
Glucose, Bld: 124 mg/dL — ABNORMAL HIGH (ref 70–99)
Potassium: 4.8 mmol/L (ref 3.5–5.1)
Sodium: 134 mmol/L — ABNORMAL LOW (ref 135–145)
Total Bilirubin: 0.6 mg/dL (ref 0.3–1.2)
Total Protein: 6.3 g/dL — ABNORMAL LOW (ref 6.5–8.1)

## 2018-11-24 LAB — CBC WITH DIFFERENTIAL/PLATELET
Abs Immature Granulocytes: 0.01 10*3/uL (ref 0.00–0.07)
Basophils Absolute: 0 10*3/uL (ref 0.0–0.1)
Basophils Relative: 0 %
Eosinophils Absolute: 0.5 10*3/uL (ref 0.0–0.5)
Eosinophils Relative: 11 %
HCT: 29 % — ABNORMAL LOW (ref 39.0–52.0)
Hemoglobin: 8.9 g/dL — ABNORMAL LOW (ref 13.0–17.0)
Immature Granulocytes: 0 %
Lymphocytes Relative: 2 %
Lymphs Abs: 0.1 10*3/uL — ABNORMAL LOW (ref 0.7–4.0)
MCH: 29 pg (ref 26.0–34.0)
MCHC: 30.7 g/dL (ref 30.0–36.0)
MCV: 94.5 fL (ref 80.0–100.0)
Monocytes Absolute: 0.4 10*3/uL (ref 0.1–1.0)
Monocytes Relative: 10 %
Neutro Abs: 3.3 10*3/uL (ref 1.7–7.7)
Neutrophils Relative %: 77 %
Platelets: 166 10*3/uL (ref 150–400)
RBC: 3.07 MIL/uL — ABNORMAL LOW (ref 4.22–5.81)
RDW: 15.5 % (ref 11.5–15.5)
WBC: 4.3 10*3/uL (ref 4.0–10.5)
nRBC: 0 % (ref 0.0–0.2)

## 2018-11-24 LAB — CMP (CANCER CENTER ONLY)
ALT: 22 U/L (ref 0–44)
AST: 28 U/L (ref 15–41)
Albumin: 3 g/dL — ABNORMAL LOW (ref 3.5–5.0)
Alkaline Phosphatase: 67 U/L (ref 38–126)
Anion gap: 7 (ref 5–15)
BUN: 32 mg/dL — ABNORMAL HIGH (ref 6–20)
CO2: 19 mmol/L — ABNORMAL LOW (ref 22–32)
Calcium: 7.7 mg/dL — ABNORMAL LOW (ref 8.9–10.3)
Chloride: 107 mmol/L (ref 98–111)
Creatinine: 1.79 mg/dL — ABNORMAL HIGH (ref 0.61–1.24)
GFR, Est AFR Am: 48 mL/min — ABNORMAL LOW (ref >60)
GFR, Estimated: 42 mL/min — ABNORMAL LOW (ref >60)
Glucose, Bld: 109 mg/dL — ABNORMAL HIGH (ref 70–99)
Potassium: 4.5 mmol/L (ref 3.5–5.1)
Sodium: 133 mmol/L — ABNORMAL LOW (ref 135–145)
Total Bilirubin: 0.5 mg/dL (ref 0.3–1.2)
Total Protein: 5.8 g/dL — ABNORMAL LOW (ref 6.5–8.1)

## 2018-11-24 LAB — LACTIC ACID, PLASMA: Lactic Acid, Venous: 0.8 mmol/L (ref 0.5–1.9)

## 2018-11-24 LAB — MRSA PCR SCREENING: MRSA by PCR: NEGATIVE

## 2018-11-24 LAB — URINALYSIS, ROUTINE W REFLEX MICROSCOPIC
Bilirubin Urine: NEGATIVE
Glucose, UA: NEGATIVE mg/dL
Hgb urine dipstick: NEGATIVE
Ketones, ur: NEGATIVE mg/dL
Leukocytes,Ua: NEGATIVE
Nitrite: NEGATIVE
Protein, ur: NEGATIVE mg/dL
Specific Gravity, Urine: 1.014 (ref 1.005–1.030)
pH: 5 (ref 5.0–8.0)

## 2018-11-24 LAB — SARS CORONAVIRUS 2 BY RT PCR (HOSPITAL ORDER, PERFORMED IN ~~LOC~~ HOSPITAL LAB): SARS Coronavirus 2: NEGATIVE

## 2018-11-24 LAB — STREP PNEUMONIAE URINARY ANTIGEN: Strep Pneumo Urinary Antigen: NEGATIVE

## 2018-11-24 LAB — APTT: aPTT: >200 seconds (ref 24–36)

## 2018-11-24 LAB — NOVEL CORONAVIRUS, NAA: SARS-CoV-2, NAA: NOT DETECTED

## 2018-11-24 LAB — PROTIME-INR
INR: 1.4 — ABNORMAL HIGH (ref 0.8–1.2)
Prothrombin Time: 16.6 seconds — ABNORMAL HIGH (ref 11.4–15.2)

## 2018-11-24 MED ORDER — CYCLOBENZAPRINE HCL 5 MG PO TABS: 5.0000 mg | ORAL_TABLET | Freq: Three times a day (TID) | ORAL | Status: DC | PRN

## 2018-11-24 MED ORDER — ONDANSETRON HCL 4 MG PO TABS
4.0000 mg | ORAL_TABLET | Freq: Four times a day (QID) | ORAL | Status: DC | PRN
  Administered 2018-11-25: 22:00:00 4 mg via ORAL
  Filled 2018-11-24: qty 1

## 2018-11-24 MED ORDER — SODIUM CHLORIDE 0.9 % IV SOLN
INTRAVENOUS | Status: DC
  Administered 2018-11-24 – 2018-11-27 (×5): via INTRAVENOUS

## 2018-11-24 MED ORDER — IOHEXOL 350 MG/ML SOLN
100.0000 mL | Freq: Once | INTRAVENOUS | Status: AC | PRN
  Administered 2018-11-24: 18:00:00 80 mL via INTRAVENOUS

## 2018-11-24 MED ORDER — LEVALBUTEROL HCL 0.63 MG/3ML IN NEBU
0.6300 mg | INHALATION_SOLUTION | Freq: Three times a day (TID) | RESPIRATORY_TRACT | Status: DC
  Administered 2018-11-24 – 2018-11-25 (×3): 0.63 mg via RESPIRATORY_TRACT
  Filled 2018-11-24 (×4): qty 3

## 2018-11-24 MED ORDER — POLYETHYLENE GLYCOL 3350 17 G PO PACK
17.0000 g | PACK | Freq: Two times a day (BID) | ORAL | Status: DC
  Filled 2018-11-24 (×4): qty 1

## 2018-11-24 MED ORDER — SODIUM CHLORIDE 0.9 % IV BOLUS (SEPSIS)
1000.0000 mL | Freq: Once | INTRAVENOUS | Status: AC
  Administered 2018-11-24: 1000 mL via INTRAVENOUS

## 2018-11-24 MED ORDER — SODIUM CHLORIDE 0.9 % IV SOLN
2.0000 g | Freq: Three times a day (TID) | INTRAVENOUS | Status: DC
  Administered 2018-11-24 – 2018-11-25 (×2): 2 g via INTRAVENOUS
  Filled 2018-11-24 (×4): qty 2

## 2018-11-24 MED ORDER — SENNOSIDES-DOCUSATE SODIUM 8.6-50 MG PO TABS
1.0000 | ORAL_TABLET | Freq: Two times a day (BID) | ORAL | Status: DC
  Administered 2018-11-24 – 2018-11-26 (×5): 1 via ORAL
  Filled 2018-11-24 (×5): qty 1

## 2018-11-24 MED ORDER — ONDANSETRON HCL 4 MG/2ML IJ SOLN: 4.0000 mg | Freq: Four times a day (QID) | INTRAMUSCULAR | Status: DC | PRN

## 2018-11-24 MED ORDER — LEVALBUTEROL HCL 0.63 MG/3ML IN NEBU: 0.6300 mg | INHALATION_SOLUTION | RESPIRATORY_TRACT | Status: DC | PRN

## 2018-11-24 MED ORDER — FLUTICASONE PROPIONATE HFA 110 MCG/ACT IN AERO: 2.0000 | INHALATION_SPRAY | Freq: Two times a day (BID) | RESPIRATORY_TRACT | Status: DC

## 2018-11-24 MED ORDER — ACETAMINOPHEN 500 MG PO TABS
1000.0000 mg | ORAL_TABLET | Freq: Once | ORAL | Status: AC
  Administered 2018-11-24: 1000 mg via ORAL
  Filled 2018-11-24: qty 2

## 2018-11-24 MED ORDER — ACETAMINOPHEN 650 MG RE SUPP: 650.0000 mg | Freq: Once | RECTAL | Status: AC

## 2018-11-24 MED ORDER — SODIUM CHLORIDE 0.9 % IV BOLUS (SEPSIS)
500.0000 mL | Freq: Once | INTRAVENOUS | Status: AC
  Administered 2018-11-24: 500 mL via INTRAVENOUS

## 2018-11-24 MED ORDER — ONDANSETRON HCL 4 MG/2ML IJ SOLN
4.0000 mg | Freq: Once | INTRAMUSCULAR | Status: AC
  Administered 2018-11-24: 4 mg via INTRAVENOUS
  Filled 2018-11-24: qty 2

## 2018-11-24 MED ORDER — VENLAFAXINE HCL ER 150 MG PO CP24
150.0000 mg | ORAL_CAPSULE | Freq: Every day | ORAL | Status: DC
  Administered 2018-11-25 – 2018-11-26 (×2): 150 mg via ORAL
  Filled 2018-11-24: qty 2
  Filled 2018-11-24: qty 1

## 2018-11-24 MED ORDER — LORAZEPAM 1 MG PO TABS
1.0000 mg | ORAL_TABLET | ORAL | Status: DC | PRN
  Administered 2018-11-25 – 2018-11-26 (×3): 1 mg via ORAL
  Filled 2018-11-24 (×3): qty 1

## 2018-11-24 MED ORDER — OXYCODONE HCL 5 MG PO TABS
10.0000 mg | ORAL_TABLET | Freq: Four times a day (QID) | ORAL | Status: DC | PRN
  Administered 2018-11-24: 22:00:00 10 mg via ORAL
  Administered 2018-11-25 – 2018-11-26 (×4): 20 mg via ORAL
  Administered 2018-11-26: 10 mg via ORAL
  Administered 2018-11-26 – 2018-11-27 (×2): 20 mg via ORAL
  Filled 2018-11-24: qty 2
  Filled 2018-11-24 (×4): qty 4
  Filled 2018-11-24 (×3): qty 2
  Filled 2018-11-24 (×2): qty 4

## 2018-11-24 MED ORDER — METRONIDAZOLE IN NACL 5-0.79 MG/ML-% IV SOLN
500.0000 mg | Freq: Three times a day (TID) | INTRAVENOUS | Status: DC
  Administered 2018-11-24 – 2018-11-25 (×2): 500 mg via INTRAVENOUS
  Filled 2018-11-24 (×2): qty 100

## 2018-11-24 MED ORDER — HEPARIN SODIUM (PORCINE) 5000 UNIT/ML IJ SOLN
5000.0000 [IU] | Freq: Three times a day (TID) | INTRAMUSCULAR | Status: DC
  Administered 2018-11-24 – 2018-11-26 (×5): 5000 [IU] via SUBCUTANEOUS
  Filled 2018-11-24 (×5): qty 1

## 2018-11-24 MED ORDER — VANCOMYCIN HCL 10 G IV SOLR
1250.0000 mg | INTRAVENOUS | Status: DC
  Filled 2018-11-24: qty 1250

## 2018-11-24 MED ORDER — GABAPENTIN 300 MG PO CAPS
300.0000 mg | ORAL_CAPSULE | Freq: Three times a day (TID) | ORAL | Status: DC
  Administered 2018-11-24: 300 mg via ORAL
  Filled 2018-11-24: qty 1

## 2018-11-24 MED ORDER — BUDESONIDE 0.5 MG/2ML IN SUSP
0.5000 mg | Freq: Two times a day (BID) | RESPIRATORY_TRACT | Status: DC
  Administered 2018-11-24 – 2018-11-27 (×6): 0.5 mg via RESPIRATORY_TRACT
  Filled 2018-11-24 (×6): qty 2

## 2018-11-24 MED ORDER — PROCHLORPERAZINE MALEATE 10 MG PO TABS: 10.0000 mg | ORAL_TABLET | Freq: Four times a day (QID) | ORAL | Status: DC | PRN

## 2018-11-24 MED ORDER — VANCOMYCIN HCL IN DEXTROSE 1-5 GM/200ML-% IV SOLN
1000.0000 mg | Freq: Once | INTRAVENOUS | Status: AC
  Administered 2018-11-24: 1000 mg via INTRAVENOUS
  Filled 2018-11-24: qty 200

## 2018-11-24 MED ORDER — LEVALBUTEROL HCL 0.63 MG/3ML IN NEBU: 0.6300 mg | INHALATION_SOLUTION | Freq: Three times a day (TID) | RESPIRATORY_TRACT | Status: DC

## 2018-11-24 MED ORDER — ACETAMINOPHEN 325 MG PO TABS: 650.0000 mg | ORAL_TABLET | Freq: Four times a day (QID) | ORAL | Status: DC | PRN

## 2018-11-24 MED ORDER — SODIUM CHLORIDE (PF) 0.9 % IJ SOLN
INTRAMUSCULAR | Status: AC
  Filled 2018-11-24: qty 50

## 2018-11-24 MED ORDER — METRONIDAZOLE IN NACL 5-0.79 MG/ML-% IV SOLN
500.0000 mg | Freq: Once | INTRAVENOUS | Status: AC
  Administered 2018-11-24: 500 mg via INTRAVENOUS
  Filled 2018-11-24: qty 100

## 2018-11-24 MED ORDER — CHLORHEXIDINE GLUCONATE CLOTH 2 % EX PADS
6.0000 | MEDICATED_PAD | Freq: Every day | CUTANEOUS | Status: DC
  Administered 2018-11-25 – 2018-11-26 (×2): 6 via TOPICAL

## 2018-11-24 MED ORDER — SODIUM CHLORIDE 0.9 % IV SOLN
2.0000 g | Freq: Once | INTRAVENOUS | Status: AC
  Administered 2018-11-24: 2 g via INTRAVENOUS
  Filled 2018-11-24: qty 2

## 2018-11-24 MED ORDER — FENTANYL 50 MCG/HR TD PT72
1.0000 | MEDICATED_PATCH | TRANSDERMAL | Status: DC
  Administered 2018-11-24 – 2018-11-27 (×2): 1 via TRANSDERMAL
  Filled 2018-11-24 (×2): qty 1

## 2018-11-24 MED ORDER — PANTOPRAZOLE SODIUM 40 MG PO TBEC
40.0000 mg | DELAYED_RELEASE_TABLET | Freq: Every day | ORAL | Status: DC
  Administered 2018-11-24 – 2018-11-26 (×3): 40 mg via ORAL
  Filled 2018-11-24 (×3): qty 1

## 2018-11-24 MED ORDER — ACETAMINOPHEN 650 MG RE SUPP: 650.0000 mg | Freq: Four times a day (QID) | RECTAL | Status: DC | PRN

## 2018-11-24 NOTE — ED Notes (Signed)
ED TO INPATIENT HANDOFF REPORT  ED Nurse Name and Phone #:  Donah Driver, RN  S Name/Age/Gender Nicholas Lowery 58 y.o. male Room/Bed: WA09/WA09  Code Status   Code Status: Full Code  Home/SNF/Other Home Patient oriented to: self, place, time and situation Is this baseline? Yes   Triage Complete: Triage complete  Chief Complaint Cancer Patient; Fever; Shortness of Breath; Cough; (Covid test came back Negative)  Triage Note Pt has hx of colon CA- here today for fever, nausea, abdominal cramping, cough, SHOB that has been going on for "A couple months", but has been increasing in severity.   Allergies Allergies  Allergen Reactions  . Penicillins Other (See Comments)    UNSPECIFIED REACTION   Has patient had a PCN reaction causing immediate rash, facial/tongue/throat swelling, SOB or lightheadedness with hypotension: unknown Has patient had a PCN reaction causing severe rash involving mucus membranes or skin necrosis: unknown Has patient had a PCN reaction that required hospitalization unknown Has patient had a PCN reaction occurring within the last 10 years: No If all of the above answers are "NO", then may proceed with Cephalosporin use.   [name] [Capecitabine] Nausea And Vomiting and Other (See Comments)    Foot pain, patient couldn't walk, and bleeding gums  . Nivolumab Nausea And Vomiting, Palpitations and Other (See Comments)    Infusion reaction: Chills, rigors, numbness. Symptoms improved with ondansetron, clonidine, famotidine, meperidine    Level of Care/Admitting Diagnosis ED Disposition    ED Disposition Condition [facility]: [place] [[id number]]  Level of Care: Stepdown [14]  Admit to SDU based on following criteria: Hemodynamic compromise or significant risk of instability:  Patient requiring short term acute titration and management of vasoactive drips, and invasive monitoring (i.e., CVP and Arterial line).  Covid  Evaluation: Person Under Investigation (PUI)  Diagnosis: Sepsis [facility]) [[id number]]  Admitting Physician: Alma Friendly [[id number]]  Attending Physician: Alma Friendly [[id number]]  Estimated length of stay: past midnight tomorrow  Certification:: I certify this patient will need inpatient services for at least 2 midnights  PT Class (Do Not Modify): Inpatient [101]  PT Acc Code (Do Not Modify): Private [1]       B Medical/Surgery History Past Medical History:  Diagnosis Date  . Anxiety   . Cancer of ascending colon ([place])   . Hypertension   . Seasonal allergies    Past Surgical History:  Procedure Laterality Date  . APPENDECTOMY  1992  . CATARACT EXTRACTION W/ INTRAOCULAR LENS IMPLANT Left 04/2016  . COLON SURGERY    . COLONOSCOPY W/ BIOPSIES AND POLYPECTOMY  04/2015  . INGUINAL HERNIA REPAIR Right 1982  . IR RADIOLOGIST EVAL & MGMT  01/16/2017  . KNEE ARTHROSCOPY W/ MENISCECTOMY Right 1998  . LAPAROSCOPIC RIGHT COLECTOMY N/A 06/14/2016   Procedure: LAPAROSCOPIC HAND ASSISTED HEMICOLECTOMY AND SMALL BOWEL RESECTION.;  Surgeon: [name], MD;  Location: [place];  Service: General;  Laterality: N/A;  . LIVER BIOPSY Right 06/14/2016   Procedure: LIVER BIOPSY;  Surgeon: [name], MD;  Location: [place];  Service: General;  Laterality: Right;  Right Inferior Liver  . PORTACATH PLACEMENT N/A 07/06/2016   Procedure: INSERTION PORT-A-CATH;  Surgeon: [name], MD;  Location: [place];  Service: General;  Laterality: N/A;  . VASECTOMY  2005     A IV Location/Drains/Wounds Patient Lines/Drains/Airways Status   Active Line/Drains/Airways    Name:   Placement date:   Placement time:  Site:   Days:   Implanted Port 07/06/16 Left Chest   07/06/16    0957    Chest   871   Incision (Closed) 06/14/16 Abdomen Other (Comment)   06/14/16    1556     893   Incision (Closed) 07/06/16 Chest Left   07/06/16    1001     871   Incision - 1 Port Abdomen 1: Superior;Medial    06/14/16    1541     893   Incision - 3 Ports Abdomen 1: Left;Superior;Lateral 2: Left;Mid;Lateral 3: Left;Lower;Lateral   06/14/16    1424     893          Intake/Output Last 24 hours No intake or output data in the 24 hours ending 11/24/18 1807  Labs/Imaging Results for orders placed or performed during the hospital encounter of 11/24/18 (from the past 48 hour(s))  Lactic acid, plasma     Status: None   Collection Time: 11/24/18 12:00 PM  Result Value Ref Range   Lactic Acid, Venous 0.8 0.5 - 1.9 mmol/L    Comment: Performed at [facility], [place] [address] [place], [place] [address]  Comprehensive metabolic panel     Status: Abnormal   Collection Time: 11/24/18 12:00 PM  Result Value Ref Range   Sodium 134 (L) 135 - 145 mmol/L   Potassium 4.8 3.5 - 5.1 mmol/L   Chloride 107 98 - 111 mmol/L   CO2 19 (L) 22 - 32 mmol/L   Glucose, Bld 124 (H) 70 - 99 mg/dL   BUN 35 (H) 6 - 20 mg/dL   Creatinine, Ser 1.85 (H) 0.61 - 1.24 mg/dL   Calcium 8.3 (L) 8.9 - 10.3 mg/dL   Total Protein 6.3 (L) 6.5 - 8.1 g/dL   Albumin 3.2 (L) 3.5 - 5.0 g/dL   AST 27 15 - 41 U/L   ALT 22 0 - 44 U/L   Alkaline Phosphatase 70 38 - 126 U/L   Total Bilirubin 0.6 0.3 - 1.2 mg/dL   GFR calc non Af Amer 40 (L) >60 mL/min   GFR calc Af Amer 46 (L) >60 mL/min   Anion gap 8 5 - 15    Comment: Performed at [facility], [place] [address] [place], [place] [address]  CBC WITH DIFFERENTIAL     Status: Abnormal   Collection Time: 11/24/18 12:00 PM  Result Value Ref Range   WBC 4.3 4.0 - 10.5 K/uL   RBC 3.07 (L) 4.22 - 5.81 MIL/uL   Hemoglobin 8.9 (L) 13.0 - 17.0 g/dL   HCT 29.0 (L) 39.0 - 52.0 %   MCV 94.5 80.0 - 100.0 fL   MCH 29.0 26.0 - 34.0 pg   MCHC 30.7 30.0 - 36.0 g/dL   RDW 15.5 11.5 - 15.5 %   Platelets 166 150 - 400 K/uL   nRBC 0.0 0.0 - 0.2 %   Neutrophils Relative % 77 %   Neutro Abs 3.3 1.7 - 7.7 K/uL   Lymphocytes Relative 2 %   Lymphs Abs 0.1 (L) 0.7 -  4.0 K/uL   Monocytes Relative 10 %   Monocytes Absolute 0.4 0.1 - 1.0 K/uL   Eosinophils Relative 11 %   Eosinophils Absolute 0.5 0.0 - 0.5 K/uL   Basophils Relative 0 %   Basophils Absolute 0.0 0.0 - 0.1 K/uL   Immature Granulocytes 0 %   Abs Immature Granulocytes 0.01 0.00 - 0.07 K/uL    Comment:  Performed at [facility], [place] [address] [place], [place] [address]  APTT     Status: Abnormal   Collection Time: 11/24/18 12:00 PM  Result Value Ref Range   aPTT >200 (HH) 24 - 36 seconds    Comment:        IF BASELINE aPTT IS ELEVATED, SUGGEST PATIENT RISK ASSESSMENT BE USED TO DETERMINE APPROPRIATE ANTICOAGULANT THERAPY. REPEATED TO VERIFY CRITICAL RESULT CALLED TO, READ BACK BY AND VERIFIED WITH: Riley Nearing 11/24/18 1413 RHOLMES Performed at [facility], [place] [address] [place], [place] [address]   Protime-INR     Status: Abnormal   Collection Time: 11/24/18 12:00 PM  Result Value Ref Range   Prothrombin Time 16.6 (H) 11.4 - 15.2 seconds   INR 1.4 (H) 0.8 - 1.2    Comment: (NOTE) INR goal varies based on device and disease states. Performed at [facility], [place] [address], [place], [place] [address]   SARS Coronavirus 2 (CEPHEID- Performed in [place] hospital lab), Hosp Order     Status: None   Collection Time: 11/24/18 12:53 PM   Specimen: Nasopharyngeal Swab  Result Value Ref Range   SARS Coronavirus 2 NEGATIVE NEGATIVE    Comment: (NOTE) If result is NEGATIVE SARS-CoV-2 target nucleic acids are NOT DETECTED. The SARS-CoV-2 RNA is generally detectable in upper and lower  respiratory specimens during the acute phase of infection. The lowest  concentration of SARS-CoV-2 viral copies this assay can detect is 250  copies / mL. A negative result does not preclude SARS-CoV-2 infection  and should not be used as the sole basis for treatment or other  patient management decisions.  A negative result may occur with   improper specimen collection / handling, submission of specimen other  than nasopharyngeal swab, presence of viral mutation(s) within the  areas targeted by this assay, and inadequate number of viral copies  (<250 copies / mL). A negative result must be combined with clinical  observations, patient history, and epidemiological information. If result is POSITIVE SARS-CoV-2 target nucleic acids are DETECTED. The SARS-CoV-2 RNA is generally detectable in upper and lower  respiratory specimens dur ing the acute phase of infection.  Positive  results are indicative of active infection with SARS-CoV-2.  Clinical  correlation with patient history and other diagnostic information is  necessary to determine patient infection status.  Positive results do  not rule out bacterial infection or co-infection with other viruses. If result is PRESUMPTIVE POSTIVE SARS-CoV-2 nucleic acids MAY BE PRESENT.   A presumptive positive result was obtained on the submitted specimen  and confirmed on repeat testing.  While 2019 novel coronavirus  (SARS-CoV-2) nucleic acids may be present in the submitted sample  additional confirmatory testing may be necessary for epidemiological  and / or clinical management purposes  to differentiate between  SARS-CoV-2 and other Sarbecovirus currently known to infect humans.  If clinically indicated additional testing with an alternate test  methodology [phone]) is advised. The SARS-CoV-2 RNA is generally  detectable in upper and lower respiratory sp ecimens during the acute  phase of infection. The expected result is Negative. Fact Sheet for Patients:  StrictlyIdeas.no Fact Sheet for Healthcare Providers: BankingDealers.co.za This test is not yet approved or cleared by the [place] FDA and has been authorized for detection and/or diagnosis of SARS-CoV-2 by FDA under an Emergency Use Authorization (EUA).  This EUA will  remain in effect (meaning this test can be used) for the duration  of the COVID-19 declaration under Section 564(b)(1) of the Act, 21 U.S.C. section 360bbb-3(b)(1), unless the authorization is terminated or revoked sooner. Performed at [facility], [place] [address] [place], [place] [address]   Urinalysis, Routine w reflex microscopic     Status: None   Collection Time: 11/24/18  5:00 PM  Result Value Ref Range   Color, Urine YELLOW YELLOW   APPearance CLEAR CLEAR   Specific Gravity, Urine 1.014 1.005 - 1.030   pH 5.0 5.0 - 8.0   Glucose, UA NEGATIVE NEGATIVE mg/dL   Hgb urine dipstick NEGATIVE NEGATIVE   Bilirubin Urine NEGATIVE NEGATIVE   Ketones, ur NEGATIVE NEGATIVE mg/dL   Protein, ur NEGATIVE NEGATIVE mg/dL   Nitrite NEGATIVE NEGATIVE   Leukocytes,Ua NEGATIVE NEGATIVE    Comment: Performed at [facility] [address], [place], [place] [address]   Dg Chest Port 1 View  Result Date: 11/24/2018 CLINICAL DATA:  Fever, cough, shortness of breath, nausea. EXAM: PORTABLE CHEST 1 VIEW COMPARISON:  November 05, 2018 FINDINGS: Left subclavian approach injectable port in stable position. Cardiomediastinal silhouette is mildly enlarged. Mediastinal contours appear intact. The left lung is clear. Right cardiophrenic airspace opacity and probable right pleural effusion. Previously noted by CT small pulmonary nodules are not seen radiographically. Osseous structures are without acute abnormality. Soft tissues are grossly normal. IMPRESSION: Right cardiophrenic airspace opacity and probable right pleural effusion. Electronically Signed   By: Fidela [place] M.D.   On: 11/24/2018 12:19    Pending Labs Unresulted Labs (From admission, onward)    Start     Ordered   11/25/18 7915  Basic metabolic panel  Tomorrow morning,   R     11/24/18 1524   11/25/18 0500  CBC  Tomorrow morning,   R     11/24/18 1524   11/25/18 0500  Creatinine, serum  Daily,   R      11/24/18 1514   11/24/18 1728  CMP ([place] only)  As needed,   STAT     11/24/18 1728   11/24/18 1458  Legionella Pneumophila Serogp 1 Ur Ag  Once,   STAT     11/24/18 1457   11/24/18 1457  Strep pneumoniae urinary antigen  Once,   STAT     11/24/18 1457   11/24/18 1128  Lactic acid, plasma  Now then every 2 hours,   STAT     11/24/18 1128   11/24/18 1128  Blood Culture (routine x 2)  BLOOD CULTURE X 2,   STAT     11/24/18 1128   11/24/18 1128  Urine culture  ONCE - STAT,   STAT     11/24/18 1128          Vitals/Pain Today's Vitals   11/24/18 1630 11/24/18 1700 11/24/18 1722 11/24/18 1730  BP: 120/79 110/76  111/78  Pulse: (!) 107 (!) 105 97   Resp: 15 15 (!) 22   Temp:      TempSrc:      SpO2: 93% 97% 96%   Weight:      Height:      PainSc:   8      Isolation Precautions Airborne and Contact precautions  Medications Medications  metroNIDAZOLE (FLAGYL) IVPB 500 mg ( Intravenous Canceled Entry 11/24/18 1523)  0.9 %  sodium chloride infusion (has no administration in time range)  cyclobenzaprine (FLEXERIL) tablet 5 mg (has no administration in time range)  fentaNYL (DURAGESIC) 50 MCG/HR 1 patch (has  no administration in time range)  fluticasone (FLOVENT HFA) 110 MCG/ACT inhaler 2 puff (2 puffs Inhalation Not Given 11/24/18 1517)  gabapentin (NEURONTIN) capsule 300 mg (has no administration in time range)  LORazepam (ATIVAN) tablet 1 mg (has no administration in time range)  pantoprazole (PROTONIX) EC tablet 40 mg (has no administration in time range)  Oxycodone HCl TABS 10-20 mg (has no administration in time range)  prochlorperazine (COMPAZINE) tablet 10 mg (has no administration in time range)  venlafaxine XR (EFFEXOR-XR) 24 hr capsule 150 mg (has no administration in time range)  levalbuterol (XOPENEX) nebulizer solution 0.63 mg (has no administration in time range)  polyethylene glycol (MIRALAX / GLYCOLAX) packet 17 g (has no administration in time range)   senna-docusate (Senokot-S) tablet 1 tablet (has no administration in time range)  heparin injection 5,000 Units (has no administration in time range)  acetaminophen (TYLENOL) tablet 650 mg (has no administration in time range)    Or  acetaminophen (TYLENOL) suppository 650 mg (has no administration in time range)  ondansetron (ZOFRAN) tablet 4 mg (has no administration in time range)    Or  ondansetron (ZOFRAN) injection 4 mg (has no administration in time range)  levalbuterol (XOPENEX) nebulizer solution 0.63 mg (has no administration in time range)  vancomycin (VANCOCIN) 1,250 mg in sodium chloride 0.9 % 250 mL IVPB (has no administration in time range)  aztreonam (AZACTAM) 2 g in sodium chloride 0.9 % 100 mL IVPB (has no administration in time range)  sodium chloride (PF) 0.9 % injection (has no administration in time range)  sodium chloride 0.9 % bolus 1,000 mL (0 mLs Intravenous Stopped 11/24/18 1245)    And  sodium chloride 0.9 % bolus 1,000 mL (0 mLs Intravenous Stopped 11/24/18 1315)    And  sodium chloride 0.9 % bolus 500 mL (0 mLs Intravenous Stopped 11/24/18 1321)  ondansetron (ZOFRAN) injection 4 mg (4 mg Intravenous Given 11/24/18 1240)  acetaminophen (TYLENOL) tablet 1,000 mg (1,000 mg Oral Given 11/24/18 1240)    Or  acetaminophen (TYLENOL) suppository 650 mg ( Rectal See Alternative 11/24/18 1240)  aztreonam (AZACTAM) 2 g in sodium chloride 0.9 % 100 mL IVPB (0 g Intravenous Stopped 11/24/18 1310)  metroNIDAZOLE (FLAGYL) IVPB 500 mg (0 mg Intravenous Stopped 11/24/18 1600)  vancomycin (VANCOCIN) IVPB 1000 mg/200 mL premix (1,000 mg Intravenous Transfusing/Transfer 11/24/18 1807)  iohexol (OMNIPAQUE) 350 MG/ML injection 100 mL (80 mLs Intravenous Contrast Given 11/24/18 1740)    Mobility walks Low fall risk   Focused Assessments Pulmonary Assessment Handoff:  Lung sounds: Bilateral Breath Sounds: Diminished L Breath Sounds: Diminished R Breath Sounds: Diminished O2 Device:  Room Air        R Recommendations: See Admitting Provider Note  Report given to:   Additional Notes:

## 2018-11-24 NOTE — ED Notes (Signed)
Report given to Mary, RN.

## 2018-11-24 NOTE — H&P (Addendum)
History and Physical  Nicholas Lowery XLK:[id number] DOB: 04/24/1961 DOA: 11/24/2018   Patient coming from: Home & is able to ambulate  Chief Complaint: Fever, cough, SOB, abdominal cramping   HPI: Nicholas Lowery is a 58 y.o. 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  Patient also complained of abdominal cramping, nausea but denies any vomiting.  Patient has been having productive cough now of greenish sputum, worsening shortness of breath for the past few weeks. Patient saw his oncologist, and was started on Bactrim about 5 days ago, with no significant improvement, prompting him to come to the ED.  Of note, patient is currently on 2 treatment, [name] is his oncologist.  ED Course: On presentation, patient noted to be febrile, tachycardic, saturating above 90 on room air.  No leukocytosis.  AKI noted with creatinine of 1.85.  Chest x-ray showed right cardiophrenic airspace opacity and probable right pleural effusion.  Patient was started on broad-spectrum antibiotics, given IV fluids.  [facility] hospitalist were consulted for admission.  Review of Systems: Review of systems are otherwise negative   Past Medical History:  Diagnosis Date  . Anxiety   . Cancer of ascending colon ([place])   . Hypertension   . Seasonal allergies    Past Surgical History:  Procedure Laterality Date  . APPENDECTOMY  1992  . CATARACT EXTRACTION W/ INTRAOCULAR LENS IMPLANT Left 04/2016  . COLON SURGERY    . COLONOSCOPY W/ BIOPSIES AND POLYPECTOMY  04/2015  . INGUINAL HERNIA REPAIR Right 1982  . IR RADIOLOGIST EVAL & MGMT  01/16/2017  . KNEE ARTHROSCOPY W/ MENISCECTOMY Right 1998  . LAPAROSCOPIC RIGHT COLECTOMY N/A 06/14/2016   Procedure: LAPAROSCOPIC HAND ASSISTED HEMICOLECTOMY AND SMALL BOWEL RESECTION.;   Surgeon: [name], MD;  Location: [facility];  Service: General;  Laterality: N/A;  . LIVER BIOPSY Right 06/14/2016   Procedure: LIVER BIOPSY;  Surgeon: [name], MD;  Location: [facility];  Service: General;  Laterality: Right;  Right Inferior Liver  . PORTACATH PLACEMENT N/A 07/06/2016   Procedure: INSERTION PORT-A-CATH;  Surgeon: [name], MD;  Location: [facility];  Service: General;  Laterality: N/A;  . VASECTOMY  2005    Social History:  reports that he quit smoking about 30 years ago. His smoking use included cigarettes. He quit after 2.00 years of use. He has never used smokeless tobacco. He reports current alcohol use of about 20.0 standard drinks of alcohol per week. He reports that he does not use drugs.   Allergies  Allergen Reactions  . Penicillins Other (See Comments)    UNSPECIFIED REACTION   Has patient had a PCN reaction causing immediate rash, facial/tongue/throat swelling, SOB or lightheadedness with hypotension: unknown Has patient had a PCN reaction causing severe rash involving mucus membranes or skin necrosis: unknown Has patient had a PCN reaction that required hospitalization unknown Has patient had a PCN reaction occurring within the last 10 years: No If all of the above answers are "NO", then may proceed with Cephalosporin use.   [name] [Capecitabine] Nausea And Vomiting and Other (See Comments)    Foot pain, patient couldn't walk, and bleeding gums  . Nivolumab Nausea And Vomiting, Palpitations and Other (See Comments)    Infusion reaction: Chills, rigors, numbness. Symptoms improved with ondansetron, clonidine, famotidine, meperidine    Family History  Problem Relation Age of Onset  . Cancer Mother        lung cancer  . Stroke Mother   . Hypertension Father   . CAD Father   . Cancer Maternal Grandfather        prostate cancer       Prior to Admission medications   Medication Sig Start Date End Date Taking? Authorizing Provider   albuterol (VENTOLIN HFA) 108 (90 Base) MCG/ACT inhaler Inhale 1-2 puffs into the lungs every 6 (six) hours as needed for wheezing or shortness of breath. 09/11/18  Yes Alla Feeling, NP  amLODipine (NORVASC) 10 MG tablet Take 1 tablet (10 mg total) by mouth daily. 11/18/18  Yes Truitt Merle, MD  celecoxib (CELEBREX) 200 MG capsule Take 200 mg by mouth daily. 11/18/18  Yes [provider]  cyclobenzaprine (FLEXERIL) 5 MG tablet Take 1 tablet (5 mg total) by mouth 3 (three) times daily as needed for muscle spasms. 11/19/18  Yes Tanner, Lyndon Code., PA-C  fentaNYL (DURAGESIC) 50 MCG/HR Place 1 patch onto the skin every other day. 11/14/18  Yes Truitt Merle, MD  fluticasone (FLOVENT HFA) 110 MCG/ACT inhaler Inhale 2 puffs into the lungs 2 (two) times daily. 11/19/18  Yes Tanner, Lyndon Code., PA-C  gabapentin (NEURONTIN) 300 MG capsule TAKE ONE CAPSULE BY MOUTH THREE TIMES DAILY Patient taking differently: Take 300 mg by mouth 3 (three) times daily.  10/23/18  Yes Truitt Merle, MD  LORazepam (ATIVAN) 1 MG tablet Take 1 tablet (1 mg total) by mouth every 4 (four) hours as needed for anxiety or sleep (nausea). 09/03/18  Yes Smith, Rondell A, MD  losartan (COZAAR) 100 MG tablet TAKE ONE TABLET BY MOUTH EVERY DAY Patient taking differently: Take 100 mg by mouth daily.  11/18/18  Yes Truitt Merle, MD  Multiple Vitamin (MULTIVITAMIN WITH MINERALS) TABS tablet Take 1 tablet by mouth daily.   Yes [provider]  omeprazole (PRILOSEC) 20 MG capsule TAKE ONE CAPSULE BY MOUTH TWICE DAILY BEFORE A MEAL Patient taking differently: Take 20 mg by mouth daily.  08/14/18  Yes Truitt Merle, MD  Oxycodone HCl 10 MG TABS Take 1-2 tablets (10-20 mg total) by mouth every 6 (six) hours as needed. Patient taking differently: Take 10-20 mg by mouth every 6 (six) hours as needed (pain).  11/14/18  Yes Truitt Merle, MD  polyethylene glycol (MIRALAX / GLYCOLAX) 17 g packet Take 17 g by mouth daily. Patient taking differently: Take 17 g by mouth daily as  needed for mild constipation.  08/29/18  Yes Domenic Moras, PA-C  prochlorperazine (COMPAZINE) 10 MG tablet Take 1 tablet (10 mg total) by mouth every 6 (six) hours as needed for nausea or vomiting. 06/16/17  Yes Truitt Merle, MD  senna-docusate (SENOKOT-S) 8.6-50 MG tablet Take 2 tablets by mouth 2 (two) times daily. Patient taking differently: Take 2 tablets by mouth as needed for mild constipation.  09/03/18  Yes Smith, Eustaquio Boyden A, MD  sildenafil (REVATIO) 20 MG tablet Take 60-100 mg by mouth See admin instructions. Take 60-100 mg by mouth at least 30 minutes prior to sexual activity 01/19/18  Yes [provider]  sulfamethoxazole-trimethoprim (BACTRIM DS) 800-160 MG tablet Take 1 tablet by mouth 2 (two) times daily. 11/19/18  Yes Tanner, Lyndon Code., PA-C  venlafaxine XR (EFFEXOR-XR) 150 MG 24 hr capsule TAKE ONE CAPSULE BY MOUTH DAILY WITH BREAKFAST Patient taking differently: Take 150 mg by mouth daily with breakfast.  11/18/18  Yes Truitt Merle, MD  cyclobenzaprine (  FLEXERIL) 5 MG tablet Take 1-2 tablets (5-10 mg total) by mouth 3 (three) times daily as needed for muscle spasms. Patient not taking: Reported on 11/24/2018 11/19/18   Alla Feeling, NP  dronabinol (MARINOL) 2.5 MG capsule Take 1 capsule (2.5 mg total) by mouth 2 (two) times daily before a meal. Patient not taking: Reported on 11/24/2018 10/14/18   Truitt Merle, MD  fentaNYL (DURAGESIC) 25 MCG/HR Place 1 patch onto the skin every other day. Patient not taking: Reported on 11/24/2018 10/18/18   Truitt Merle, MD  simethicone (MYLICON) 80 MG chewable tablet Chew 1 tablet (80 mg total) by mouth 4 (four) times daily as needed for flatulence. Patient not taking: Reported on 11/24/2018 09/03/18   Norval Morton, MD  trifluridine-tipiracil (LONSURF) 20-8.19 MG tablet Take 4 tabs (80mg  trifluridine) PO in AM & 3 tabs (60mg  trifluridine) in PM, immediately after food on days 1-5 & 8-12 of each 28d cycle 11/18/18   Truitt Merle, MD    Physical Exam: BP 111/78   Pulse  97   Temp 100.3 F (37.9 C) (Oral)   Resp (!) 22   Ht 6\' 3"  (1.905 m)   Wt 80 kg   SpO2 96%   BMI 22.04 kg/m   General: NAD, AAO x3 Eyes: Normal ENT: Normal Neck: Supple Cardiovascular: S1, S2 present Respiratory: Diminished breath sounds bilaterally Abdomen: Soft, mild generalized tenderness, nondistended, bowel sounds present Skin: Normal Musculoskeletal: No pedal edema bilaterally Psychiatric: Normal mood Neurologic: No focal neurologic deficits noted          Labs on Admission:  Basic Metabolic Panel: Recent Labs  Lab 11/19/18 0854 11/24/18 1200  NA 138 134*  K 4.4 4.8  CL 107 107  CO2 22 19*  GLUCOSE 101* 124*  BUN 19 35*  CREATININE 1.38* 1.85*  CALCIUM 8.5* 8.3*   Liver Function Tests: Recent Labs  Lab 11/19/18 0854 11/24/18 1200  AST 24 27  ALT 18 22  ALKPHOS 75 70  BILITOT 0.4 0.6  PROT 6.8 6.3*  ALBUMIN 3.6 3.2*   No results for input(s): LIPASE, AMYLASE in the last 168 hours. No results for input(s): AMMONIA in the last 168 hours. CBC: Recent Labs  Lab 11/24/18 1200  WBC 4.3  NEUTROABS 3.3  HGB 8.9*  HCT 29.0*  MCV 94.5  PLT 166   Cardiac Enzymes: No results for input(s): CKTOTAL, CKMB, CKMBINDEX, TROPONINI in the last 168 hours.  BNP (last 3 results) No results for input(s): BNP in the last 8760 hours.  ProBNP (last 3 results) No results for input(s): PROBNP in the last 8760 hours.  CBG: No results for input(s): GLUCAP in the last 168 hours.  Radiological Exams on Admission: Dg Chest Port 1 View  Result Date: 11/24/2018 CLINICAL DATA:  Fever, cough, shortness of breath, nausea. EXAM: PORTABLE CHEST 1 VIEW COMPARISON:  November 05, 2018 FINDINGS: Left subclavian approach injectable port in stable position. Cardiomediastinal silhouette is mildly enlarged. Mediastinal contours appear intact. The left lung is clear. Right cardiophrenic airspace opacity and probable right pleural effusion. Previously noted by CT small pulmonary nodules  are not seen radiographically. Osseous structures are without acute abnormality. Soft tissues are grossly normal. IMPRESSION: Right cardiophrenic airspace opacity and probable right pleural effusion. Electronically Signed   By: Fidela Salisbury M.D.   On: 11/24/2018 12:19    EKG: Independently reviewed.  Sinus tachycardia, no acute ST changes  Assessment/Plan Present on Admission: . Sepsis ([place]) . Metastatic colon cancer to liver ([place])  Principal Problem:   Sepsis ([place]) Active Problems:   Metastatic colon cancer to liver ([place])  Sepsis likely 2/2 HCAP On presentation patient noted to be febrile, tachycardic No leukocytosis, lactic acid 0.8 BC x2 pending UA negative, UC pending Urine strep pneumo, Legionella pending COVID-19 negative Chest x-ray showed right cardiophrenic airspace opacity and probable right pleural effusion CT chest, abdomen pending Continue IV fluids Continue IV vancomycin, aztreonam  Duo nebs, oxygen supplementation PRN Monitor for today in SDU  AKI Likely due to above versus recent Bactrim use Vs poor oral intake IV fluids Daily BMP  Hypertension BP soft Hold home losartan, amlodipine  Anemia of chronic disease (malignancy) Hemoglobin noted to be trending lower, from 12.5--> 8.9 from May Denies any obvious signs of bleeding Daily BMP  Metastatic colon cancer to the liver/lung Currently receiving treatment [name] his oncologist, please contact in a.m. to let her know patient is admitted  Chronic pain syndrome Continue fentanyl patch, Flexeril, gabapentin, oxycodone Strict bowel regimen  GERD Continue PPI  Depression/anxiety Continue venlafaxine, Ativan as needed       DVT prophylaxis: Heparin  Code Status: Full, patient decided to change his status  Family Communication: Dicussed with wife  Disposition Plan: To be determined  Consults called: None  Admission status: Inpatient    Alma Friendly MD [facility]   If 7PM-7AM, please contact night-coverage [email or web address]   11/24/2018, 6:02 PM

## 2018-11-24 NOTE — ED Triage Notes (Signed)
Pt has hx of colon CA- here today for fever, nausea, abdominal cramping, cough, SHOB that has been going on for "A couple months", but has been increasing in severity.

## 2018-11-24 NOTE — Progress Notes (Signed)
A consult was received from an ED physician for aztreonam and vancomycin per pharmacy dosing.  The patient's profile has been reviewed for ht/wt/allergies/indication/available labs.    A one time order has been placed for vancomycin 1gm and azteonam 2gm.  Further antibiotics/pharmacy consults should be ordered by admitting physician if indicated.                       Thank you, Lynelle Doctor 11/24/2018  12:04 PM

## 2018-11-24 NOTE — ED Provider Notes (Signed)
Medical screening examination/treatment/procedure(s) were conducted as a shared visit with non-physician practitioner(s) and myself.  I personally evaluated the patient during the encounter.      Results for orders placed or performed in visit on 11/19/18  CMP ([facility] only)  Result Value Ref Range   Sodium 138 135 - 145 mmol/L   Potassium 4.4 3.5 - 5.1 mmol/L   Chloride 107 98 - 111 mmol/L   CO2 22 22 - 32 mmol/L   Glucose, Bld 101 (H) 70 - 99 mg/dL   BUN 19 6 - 20 mg/dL   Creatinine 1.38 (H) 0.61 - 1.24 mg/dL   Calcium 8.5 (L) 8.9 - 10.3 mg/dL   Total Protein 6.8 6.5 - 8.1 g/dL   Albumin 3.6 3.5 - 5.0 g/dL   AST 24 15 - 41 U/L   ALT 18 0 - 44 U/L   Alkaline Phosphatase 75 38 - 126 U/L   Total Bilirubin 0.4 0.3 - 1.2 mg/dL   GFR, Est Non Af Am 57 (L) >60 mL/min   GFR, Est AFR Am >60 >60 mL/min   Anion gap 9 5 - 15  CEA (IN HOUSE-[facility])  Result Value Ref Range   CEA ([facility]-In House) 78.74 (H) 0.00 - 5.00 ng/mL  Total Protein, Urine dipstick  Result Value Ref Range   Protein, ur NEGATIVE NEGATIVE mg/dL   Dg Chest 2 View  Result Date: 11/05/2018 CLINICAL DATA:  History of metastatic colon cancer. Cough for 2-3 months. EXAM: CHEST - 2 VIEW COMPARISON:  CT chest 09/02/2018.  PA and lateral chest 02/24/2017. FINDINGS: The patient has a small right pleural effusion and basilar atelectasis. Scattered pulmonary nodules are identified, most prominent in the right lower lobe. No pneumothorax. No consolidative process. Heart size is normal. No acute or focal bony abnormality. IMPRESSION: Small right pleural effusion and pulmonary nodules. The appearance is not markedly changed compared to the prior chest CT. Electronically Signed   By: Inge Rise M.D.   On: 11/05/2018 08:58   Ct Chest W Contrast  Result Date: 11/13/2018 CLINICAL DATA:  Restaging metastatic colon cancer EXAM: CT CHEST, ABDOMEN, AND PELVIS WITH CONTRAST TECHNIQUE: Multidetector CT imaging of the chest, abdomen and  pelvis was performed following the standard protocol during bolus administration of intravenous contrast. CONTRAST:  192mL OMNIPAQUE IOHEXOL 300 MG/ML  SOLN COMPARISON:  CT chest 09/02/2018 and CT AP 08/29/2018 FINDINGS: CT CHEST FINDINGS Cardiovascular: The heart size appears normal. Small pericardial effusion, new. Lad coronary artery calcifications. Mediastinum/Nodes: Normal appearance of the thyroid gland. The trachea appears patent and is midline. Normal appearance of the esophagus. -index right paratracheal lymph node measures 1.2 cm, image 14/2. Previously 1.0 cm. Index right paratracheal lymph node measures 1 cm, image 16/2. Previously 0.9 cm. Index right pre-vascular node measures 0.9 cm, image 23/2. Previously 0.7 cm. Right hilar node measures 2 cm, image 33/2.  Previously 1.3 cm. Lungs/Pleura: Similar small to moderate right pleural effusion. Index posteromedial right lower lobe lung nodule measures 1 cm, image 80/6. Previously 0.9 cm. Index superior segment of left lower lobe lung nodule measures 1.9 cm, image 44/6. Unchanged. Index left upper lobe lung nodule measures 0.6 cm, image 69/6. Previously 0.5 cm. Index right upper lobe lung nodule measures 0.6 cm, image 44/6. Previously 0.7 cm. Index right middle lobe perifissural nodule measures 1 cm, image 91/6. Previously 0.8 cm. Index perifissural right lower lobe lung nodule measures 0.8 cm, image 112/6. Previously 1 cm. Musculoskeletal: No chest wall mass or suspicious bone lesions  identified. CT ABDOMEN PELVIS FINDINGS Hepatobiliary: Index lesion within segment 6 measures 4.2 cm, image 72/2. Previously 3.2 cm. New, tiny nonspecific hypodensity along the dome measures 7 mm. Gallbladder normal. No biliary dilatation. Pancreas: Unremarkable. No pancreatic ductal dilatation or surrounding inflammatory changes. Spleen: The spleen is enlarged measuring 15.3 cm in cranial caudal dimension, unchanged. No focal splenic lesion. Adrenals/Urinary Tract: Normal  adrenal glands. No kidney mass or hydronephrosis. Urinary bladder normal. Stomach/Bowel: The stomach is nondistended. Status post right hemicolectomy with entero colonic anastomosis. 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 Previously 4.0 x 4.8 cm. No evidence for high-grade bowel obstruction. Vascular/Lymphatic: Normal appearance of the abdominal aorta. Tumor encasement of the ileocolic branches of the superior mesenteric artery and vein identified. There is venous congestion identified within the associated ileocolic mesentery. Portal vein remains patent. Reproductive: Prostate is unremarkable. Other: New small volume ascites identified. Peritoneal implant at the level of the umbilicus measures 2.7 by 1.4 cm, image 84/2. Previously 2.0 x 1.3 cm. Musculoskeletal: No acute or significant osseous findings. IMPRESSION: 1. There is been interval enlargement of segment 6 liver metastases. Additionally mesenteric tumor at the enterocolonic anastomosis is mildly increased in size from previous exam. Peritoneal deposit along at the umbilicus is mildly increased in size in the interval. New ascites. Findings are concerning for mild progression of disease within the abdomen and pelvis. 2. No significant change in diffuse pulmonary metastases. Stable volume of right pleural effusion. 3. Previous index mesenteric lymph nodes are mildly increased in size in the interval. 4. New small pericardial effusion. Electronically Signed   By: Kerby Moors M.D.   On: 11/13/2018 13:27   Ct Abdomen Pelvis W Contrast  Result Date: 11/13/2018 CLINICAL DATA:  Restaging metastatic colon cancer EXAM: CT CHEST, ABDOMEN, AND PELVIS WITH CONTRAST TECHNIQUE: Multidetector CT imaging of the chest, abdomen and pelvis was performed following the standard protocol during bolus administration of intravenous contrast. CONTRAST:  128mL OMNIPAQUE IOHEXOL 300 MG/ML  SOLN COMPARISON:  CT chest 09/02/2018 and CT AP  08/29/2018 FINDINGS: CT CHEST FINDINGS Cardiovascular: The heart size appears normal. Small pericardial effusion, new. Lad coronary artery calcifications. Mediastinum/Nodes: Normal appearance of the thyroid gland. The trachea appears patent and is midline. Normal appearance of the esophagus. -index right paratracheal lymph node measures 1.2 cm, image 14/2. Previously 1.0 cm. Index right paratracheal lymph node measures 1 cm, image 16/2. Previously 0.9 cm. Index right pre-vascular node measures 0.9 cm, image 23/2. Previously 0.7 cm. Right hilar node measures 2 cm, image 33/2.  Previously 1.3 cm. Lungs/Pleura: Similar small to moderate right pleural effusion. Index posteromedial right lower lobe lung nodule measures 1 cm, image 80/6. Previously 0.9 cm. Index superior segment of left lower lobe lung nodule measures 1.9 cm, image 44/6. Unchanged. Index left upper lobe lung nodule measures 0.6 cm, image 69/6. Previously 0.5 cm. Index right upper lobe lung nodule measures 0.6 cm, image 44/6. Previously 0.7 cm. Index right middle lobe perifissural nodule measures 1 cm, image 91/6. Previously 0.8 cm. Index perifissural right lower lobe lung nodule measures 0.8 cm, image 112/6. Previously 1 cm. Musculoskeletal: No chest wall mass or suspicious bone lesions identified. CT ABDOMEN PELVIS FINDINGS Hepatobiliary: Index lesion within segment 6 measures 4.2 cm, image 72/2. Previously 3.2 cm. New, tiny nonspecific hypodensity along the dome measures 7 mm. Gallbladder normal. No biliary dilatation. Pancreas: Unremarkable. No pancreatic ductal dilatation or surrounding inflammatory changes. Spleen: The spleen is enlarged measuring 15.3 cm in cranial caudal  dimension, unchanged. No focal splenic lesion. Adrenals/Urinary Tract: Normal adrenal glands. No kidney mass or hydronephrosis. Urinary bladder normal. Stomach/Bowel: The stomach is nondistended. Status post right hemicolectomy with entero colonic anastomosis. Central mesenteric  tumor at the anastomosis measures 5.6 x 7.1 cm, image 53/4. Previously 4.0 x 4.8 cm. No evidence for high-grade bowel obstruction. Vascular/Lymphatic: Normal appearance of the abdominal aorta. Tumor encasement of the ileocolic branches of the superior mesenteric artery and vein identified. There is venous congestion identified within the associated ileocolic mesentery. Portal vein remains patent. Reproductive: Prostate is unremarkable. Other: New small volume ascites identified. Peritoneal implant at the level of the umbilicus measures 2.7 by 1.4 cm, image 84/2. Previously 2.0 x 1.3 cm. Musculoskeletal: No acute or significant osseous findings. IMPRESSION: 1. There is been interval enlargement of segment 6 liver metastases. Additionally mesenteric tumor at the enterocolonic anastomosis is mildly increased in size from previous exam. Peritoneal deposit along at the umbilicus is mildly increased in size in the interval. New ascites. Findings are concerning for mild progression of disease within the abdomen and pelvis. 2. No significant change in diffuse pulmonary metastases. Stable volume of right pleural effusion. 3. Previous index mesenteric lymph nodes are mildly increased in size in the interval. 4. New small pericardial effusion. Electronically Signed   By: Kerby Moors M.D.   On: 11/13/2018 13:27   Dg Chest Port 1 View  Result Date: 11/24/2018 CLINICAL DATA:  Fever, cough, shortness of breath, nausea. EXAM: PORTABLE CHEST 1 VIEW COMPARISON:  November 05, 2018 FINDINGS: Left subclavian approach injectable port in stable position. Cardiomediastinal silhouette is mildly enlarged. Mediastinal contours appear intact. The left lung is clear. Right cardiophrenic airspace opacity and probable right pleural effusion. Previously noted by CT small pulmonary nodules are not seen radiographically. Osseous structures are without acute abnormality. Soft tissues are grossly normal. IMPRESSION: Right cardiophrenic airspace  opacity and probable right pleural effusion. Electronically Signed   By: Fidela Salisbury M.D.   On: 11/24/2018 12:19     Patient seen by me along with the physician assistant.  Patient has a history of metastatic colon cancer.  Is followed by hematology oncology here.  Patient presenting with a complaint of fever nausea abdominal cramping cough and shortness of breath.  Shortness of breath is been for a couple of months.  Is been increasing in severity.  Patient's vital signs here are consistent with septic parameters.  With a temp of 101.7.  Sinus tachycardia on monitor and EKG.  Respiratory rate 22 but blood pressure is good not hypo-tensive.  Systolic is 570.  Oxygen sats are 93 to 99%.  Patient will be started on broad-spectrum antibiotics and sepsis protocol.  Patient will require admission.  Chest x-ray does show airspace opacity on the right side.  This is probably the source of the fever.  CRITICAL CARE Performed by: Fredia Sorrow Total critical care time: 30 minutes Critical care time was exclusive of separately billable procedures and treating other patients. Critical care was necessary to treat or prevent imminent or life-threatening deterioration. Critical care was time spent personally by me on the following activities: development of treatment plan with patient and/or surrogate as well as nursing, discussions with consultants, evaluation of patient's response to treatment, examination of patient, obtaining history from patient or surrogate, ordering and performing treatments and interventions, ordering and review of laboratory studies, ordering and review of radiographic studies, pulse oximetry and re-evaluation of patient's condition.    Fredia Sorrow, MD 11/24/18 1248

## 2018-11-24 NOTE — ED Provider Notes (Signed)
[facility] DEPT Provider Note   CSN: [id number] Arrival date & time: 11/24/18  1053    History   Chief Complaint Chief Complaint  Patient presents with  . Fever  . Abdominal Cramping    HPI Nicholas Lowery is a 58 y.o. male.     The history is provided by the patient and medical records. No language interpreter was used.  Fever Associated symptoms: cough, headaches and nausea   Associated symptoms: no chest pain, no diarrhea and no vomiting   Abdominal Cramping Associated symptoms include abdominal pain, headaches and shortness of breath. Pertinent negatives include no chest pain.   Nicholas Lowery is a 58 y.o. male  with a PMH of metastatic colon cancer who presents to the Emergency Department complaining of fever which began yesterday.  He had a temperature of 103 at home this morning, prompting him to come to the ER for evaluation.  No antipyretics prior to arrival.  Associated with cough and shortness of breath.  Also endorses nausea, decreased appetite and abdominal cramping.  Has not had any emesis.  Had a bowel movement this morning, however does feel as if he is constipated.  Bowel movement today was firm and large for him.  Seen by oncology 5 days ago where he was started on Bactrim for presumed pneumonia.  He has been taking this as directed without any missed doses.  He did have a COVID test at that appointment as well per patient which was negative.  Past Medical History:  Diagnosis Date  . Anxiety   . Cancer of ascending colon ([place])   . Hypertension   . Seasonal allergies     Patient Active Problem List   Diagnosis Date Noted  . Sepsis ([place]) 11/24/2018  . Anxiety and depression 10/01/2018  . DNR (do not resuscitate) 09/04/2018  . Cancer related pain 08/30/2018  . Advanced care planning/counseling discussion   . Palliative care by specialist   . Anxiety associated with cancer diagnosis   . 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 HTN (hypertension) 09/06/2016  . Port catheter in place 08/10/2016  . Iron deficiency anemia due to chronic blood loss 07/05/2016  . Goals of care, counseling/discussion 07/05/2016  . Diarrhea 06/06/2016  . Elevated blood pressure reading 06/06/2016  . Metastatic colon cancer to liver ([place]) 06/01/2016    Past Surgical History:  Procedure Laterality Date  . APPENDECTOMY  1992  . CATARACT EXTRACTION W/ INTRAOCULAR LENS IMPLANT Left 04/2016  . COLON SURGERY    . COLONOSCOPY W/ BIOPSIES AND POLYPECTOMY  04/2015  . INGUINAL HERNIA REPAIR Right 1982  . IR RADIOLOGIST EVAL & MGMT  01/16/2017  . KNEE ARTHROSCOPY W/ MENISCECTOMY Right 1998  . LAPAROSCOPIC RIGHT COLECTOMY N/A 06/14/2016   Procedure: LAPAROSCOPIC HAND ASSISTED HEMICOLECTOMY AND SMALL BOWEL RESECTION.;  Surgeon: [name], MD;  Location: [facility];  Service: General;  Laterality: N/A;  . LIVER BIOPSY Right 06/14/2016   Procedure: LIVER BIOPSY;  Surgeon: [name], MD;  Location: [facility];  Service: General;  Laterality: Right;  Right Inferior Liver  . PORTACATH PLACEMENT N/A 07/06/2016   Procedure: INSERTION PORT-A-CATH;  Surgeon: [name], MD;  Location: [facility];  Service: General;  Laterality: N/A;  . VASECTOMY  2005        Home Medications    Prior to Admission medications   Medication Sig Start Date End Date Taking? Authorizing Provider  albuterol (VENTOLIN HFA) 108 (90 Base) MCG/ACT inhaler Inhale  1-2 puffs into the lungs every 6 (six) hours as needed for wheezing or shortness of breath. 09/11/18  Yes Alla Feeling, NP  amLODipine (NORVASC) 10 MG tablet Take 1 tablet (10 mg total) by mouth daily. 11/18/18  Yes Truitt Merle, MD  celecoxib (CELEBREX) 200 MG capsule Take 200 mg by mouth daily. 11/18/18  Yes [provider]  cyclobenzaprine (FLEXERIL) 5 MG tablet Take 1 tablet (5 mg total) by mouth 3 (three) times daily as needed for muscle spasms. 11/19/18  Yes Tanner, Lyndon Code., PA-C  fentaNYL (DURAGESIC) 50 MCG/HR Place 1 patch onto the skin every other day. 11/14/18  Yes Truitt Merle, MD  fluticasone (FLOVENT HFA) 110 MCG/ACT inhaler Inhale 2 puffs into the lungs 2 (two) times daily. 11/19/18  Yes Tanner, Lyndon Code., PA-C  gabapentin (NEURONTIN) 300 MG capsule TAKE ONE CAPSULE BY MOUTH THREE TIMES DAILY Patient taking differently: Take 300 mg by mouth 3 (three) times daily.  10/23/18  Yes Truitt Merle, MD  LORazepam (ATIVAN) 1 MG tablet Take 1 tablet (1 mg total) by mouth every 4 (four) hours as needed for anxiety or sleep (nausea). 09/03/18  Yes Smith, Rondell A, MD  losartan (COZAAR) 100 MG tablet TAKE ONE TABLET BY MOUTH EVERY DAY Patient taking differently: Take 100 mg by mouth daily.  11/18/18  Yes Truitt Merle, MD  Multiple Vitamin (MULTIVITAMIN WITH MINERALS) TABS tablet Take 1 tablet by mouth daily.   Yes [provider]  omeprazole (PRILOSEC) 20 MG capsule TAKE ONE CAPSULE BY MOUTH TWICE DAILY BEFORE A MEAL Patient taking differently: Take 20 mg by mouth daily.  08/14/18  Yes Truitt Merle, MD  Oxycodone HCl 10 MG TABS Take 1-2 tablets (10-20 mg total) by mouth every 6 (six) hours as needed. Patient taking differently: Take 10-20 mg by mouth every 6 (six) hours as needed (pain).  11/14/18  Yes Truitt Merle, MD  polyethylene glycol (MIRALAX / GLYCOLAX) 17 g packet Take 17 g by mouth daily. Patient taking differently: Take 17 g by mouth daily as needed for mild constipation.  08/29/18  Yes Domenic Moras, PA-C  prochlorperazine (COMPAZINE) 10 MG tablet Take 1 tablet (10 mg total) by mouth every 6 (six) hours as needed for nausea or vomiting. 06/16/17  Yes Truitt Merle, MD  senna-docusate (SENOKOT-S) 8.6-50 MG tablet Take 2 tablets by mouth 2 (two) times daily. Patient taking differently: Take 2 tablets by mouth as needed for mild constipation.  09/03/18  Yes Smith, Eustaquio Boyden A, MD  sildenafil (REVATIO) 20 MG tablet Take 60-100 mg by mouth See admin instructions. Take 60-100 mg by mouth at least  30 minutes prior to sexual activity 01/19/18  Yes [provider]  sulfamethoxazole-trimethoprim (BACTRIM DS) 800-160 MG tablet Take 1 tablet by mouth 2 (two) times daily. 11/19/18  Yes Tanner, Lyndon Code., PA-C  venlafaxine XR (EFFEXOR-XR) 150 MG 24 hr capsule TAKE ONE CAPSULE BY MOUTH DAILY WITH BREAKFAST Patient taking differently: Take 150 mg by mouth daily with breakfast.  11/18/18  Yes Truitt Merle, MD  cyclobenzaprine (FLEXERIL) 5 MG tablet Take 1-2 tablets (5-10 mg total) by mouth 3 (three) times daily as needed for muscle spasms. Patient not taking: Reported on 11/24/2018 11/19/18   Alla Feeling, NP  dronabinol (MARINOL) 2.5 MG capsule Take 1 capsule (2.5 mg total) by mouth 2 (two) times daily before a meal. Patient not taking: Reported on 11/24/2018 10/14/18   Truitt Merle, MD  fentaNYL (DURAGESIC) 25 MCG/HR Place 1 patch onto the skin  every other day. Patient not taking: Reported on 11/24/2018 10/18/18   Truitt Merle, MD  simethicone (MYLICON) 80 MG chewable tablet Chew 1 tablet (80 mg total) by mouth 4 (four) times daily as needed for flatulence. Patient not taking: Reported on 11/24/2018 09/03/18   Norval Morton, MD  trifluridine-tipiracil (LONSURF) 20-8.19 MG tablet Take 4 tabs (80mg  trifluridine) PO in AM & 3 tabs (60mg  trifluridine) in PM, immediately after food on days 1-5 & 8-12 of each 28d cycle 11/18/18   Truitt Merle, MD    Family History Family History  Problem Relation Age of Onset  . Cancer Mother        lung cancer  . Stroke Mother   . Hypertension Father   . CAD Father   . Cancer Maternal Grandfather        prostate cancer     Social History Social History   Tobacco Use  . Smoking status: Former Smoker    Years: 2.00    Types: Cigarettes    Quit date: 1990    Years since quitting: 30.5  . Smokeless tobacco: Never Used  Substance Use Topics  . Alcohol use: Yes    Alcohol/week: 20.0 standard drinks    Types: 20 Cans of beer per week    Comment: pt states he drinks about  3-4 beers per night  . Drug use: No    Comment: 06/15/2016 "nothing since college"     Allergies   Penicillins, Xeloda [capecitabine], and Nivolumab   Review of Systems Review of Systems  Constitutional: Positive for fever.  Respiratory: Positive for cough and shortness of breath. Negative for wheezing.   Cardiovascular: Negative for chest pain.  Gastrointestinal: Positive for abdominal pain, constipation and nausea. Negative for diarrhea and vomiting.  Neurological: Positive for headaches.  All other systems reviewed and are negative.    Physical Exam Updated Vital Signs BP (!) 132/96 (BP Location: Right Arm)   Pulse (!) 130   Temp 100.3 F (37.9 C) (Oral)   Resp (!) 22   Ht 6\' 3"  (1.905 m)   Wt 80 kg   SpO2 95%   BMI 22.04 kg/m   Physical Exam Vitals signs and nursing note reviewed.  Constitutional:      General: He is not in acute distress.    Appearance: He is well-developed.  HENT:     Head: Normocephalic and atraumatic.  Neck:     Musculoskeletal: Neck supple.  Cardiovascular:     Rate and Rhythm: Regular rhythm.     Heart sounds: Normal heart sounds. No murmur.     Comments: Tachycardic, but regular. Pulmonary:     Effort: Pulmonary effort is normal. No respiratory distress.  Abdominal:     General: There is no distension.     Palpations: Abdomen is soft.     Tenderness: There is no guarding or rebound.     Comments: Generalized abdominal tenderness.  Skin:    General: Skin is warm and dry.  Neurological:     Mental Status: He is alert and oriented to person, place, and time.      ED Treatments / Results  Labs (all labs ordered are listed, but only abnormal results are displayed) Labs Reviewed  COMPREHENSIVE METABOLIC PANEL - Abnormal; Notable for the following components:      Result Value   Sodium 134 (*)    CO2 19 (*)    Glucose, Bld 124 (*)    BUN 35 (*)    Creatinine,  Ser 1.85 (*)    Calcium 8.3 (*)    Total Protein 6.3 (*)     Albumin 3.2 (*)    GFR calc non Af Amer 40 (*)    GFR calc Af Amer 46 (*)    All other components within normal limits  CBC WITH DIFFERENTIAL/PLATELET - Abnormal; Notable for the following components:   RBC 3.07 (*)    Hemoglobin 8.9 (*)    HCT 29.0 (*)    Lymphs Abs 0.1 (*)    All other components within normal limits  APTT - Abnormal; Notable for the following components:   aPTT >200 (*)    All other components within normal limits  PROTIME-INR - Abnormal; Notable for the following components:   Prothrombin Time 16.6 (*)    INR 1.4 (*)    All other components within normal limits  SARS CORONAVIRUS 2 (HOSPITAL ORDER, [facility])  CULTURE, BLOOD (ROUTINE X 2)  CULTURE, BLOOD (ROUTINE X 2)  URINE CULTURE  LACTIC ACID, PLASMA  LACTIC ACID, PLASMA  URINALYSIS, ROUTINE W REFLEX MICROSCOPIC  STREP PNEUMONIAE URINARY ANTIGEN  LEGIONELLA PNEUMOPHILA SEROGP 1 UR AG    EKG None  Radiology Dg Chest Port 1 View  Result Date: 11/24/2018 CLINICAL DATA:  Fever, cough, shortness of breath, nausea. EXAM: PORTABLE CHEST 1 VIEW COMPARISON:  November 05, 2018 FINDINGS: Left subclavian approach injectable port in stable position. Cardiomediastinal silhouette is mildly enlarged. Mediastinal contours appear intact. The left lung is clear. Right cardiophrenic airspace opacity and probable right pleural effusion. Previously noted by CT small pulmonary nodules are not seen radiographically. Osseous structures are without acute abnormality. Soft tissues are grossly normal. IMPRESSION: Right cardiophrenic airspace opacity and probable right pleural effusion. Electronically Signed   By: Fidela Salisbury M.D.   On: 11/24/2018 12:19    Procedures Procedures (including critical care time)  CRITICAL CARE Performed by: Ozella Almond Keryn Nessler   Total critical care time: 35 minutes  Critical care time was exclusive of separately billable procedures and treating other patients.   Critical care was necessary to treat or prevent imminent or life-threatening deterioration.  Critical care was time spent personally by me on the following activities: development of treatment plan with patient and/or surrogate as well as nursing, discussions with consultants, evaluation of patient's response to treatment, examination of patient, obtaining history from patient or surrogate, ordering and performing treatments and interventions, ordering and review of laboratory studies, ordering and review of radiographic studies, pulse oximetry and re-evaluation of patient's condition.   Medications Ordered in ED Medications  metroNIDAZOLE (FLAGYL) IVPB 500 mg (has no administration in time range)  vancomycin (VANCOCIN) IVPB 1000 mg/200 mL premix (has no administration in time range)  metroNIDAZOLE (FLAGYL) IVPB 500 mg (has no administration in time range)  0.9 %  sodium chloride infusion (has no administration in time range)  cyclobenzaprine (FLEXERIL) tablet 5 mg (has no administration in time range)  fentaNYL (DURAGESIC) 50 MCG/HR 1 patch (has no administration in time range)  fluticasone (FLOVENT HFA) 110 MCG/ACT inhaler 2 puff (has no administration in time range)  gabapentin (NEURONTIN) capsule 300 mg (has no administration in time range)  LORazepam (ATIVAN) tablet 1 mg (has no administration in time range)  pantoprazole (PROTONIX) EC tablet 40 mg (has no administration in time range)  Oxycodone HCl TABS 10-20 mg (has no administration in time range)  prochlorperazine (COMPAZINE) tablet 10 mg (has no administration in time range)  venlafaxine XR (EFFEXOR-XR) 24 hr  capsule 150 mg (has no administration in time range)  levalbuterol (XOPENEX) nebulizer solution 0.63 mg (has no administration in time range)  levalbuterol (XOPENEX) nebulizer solution 0.63 mg (has no administration in time range)  polyethylene glycol (MIRALAX / GLYCOLAX) packet 17 g (has no administration in time range)   senna-docusate (Senokot-S) tablet 1 tablet (has no administration in time range)  sodium chloride 0.9 % bolus 1,000 mL (0 mLs Intravenous Stopped 11/24/18 1245)    And  sodium chloride 0.9 % bolus 1,000 mL (1,000 mLs Intravenous New Bag/Given 11/24/18 1245)    And  sodium chloride 0.9 % bolus 500 mL (0 mLs Intravenous Stopped 11/24/18 1321)  ondansetron (ZOFRAN) injection 4 mg (4 mg Intravenous Given 11/24/18 1240)  acetaminophen (TYLENOL) tablet 1,000 mg (1,000 mg Oral Given 11/24/18 1240)    Or  acetaminophen (TYLENOL) suppository 650 mg ( Rectal See Alternative 11/24/18 1240)  aztreonam (AZACTAM) 2 g in sodium chloride 0.9 % 100 mL IVPB (0 g Intravenous Stopped 11/24/18 1310)     Initial Impression / Assessment and Plan / ED Course  I have reviewed the triage vital signs and the nursing notes.  Pertinent labs & imaging results that were available during my care of the patient were reviewed by me and considered in my medical decision making (see chart for details).       Nicholas Lowery is a 58 y.o. male with history of metastatic colon cancer who presents to the emergency department for fever of 103 at home associated with cough, shortness of breath and generalized abdominal cramping.  Per chart review, patient seen by oncology and diagnosed with pneumonia a few days ago.  Started on Bactrim which he has been compliant with, however he feels as if his symptoms are worsening.  Normal white count and lactic, however given sirs criteria, code sepsis was called and patient was started on broad-spectrum antibiotics.  Chest x-ray does show right cardiophrenic airspace opacity with likely pleural effusion.  Independently reviewed by myself and attending Dr. Bobby Rumpf and agree with radiology.  Likely source for fever.  Had an outpatient COVID test a few days ago at onset of symptoms.  Coronavirus test here negative as well.  Hospitalist consulted who will admit for ongoing care.  Patient seen by  and discussed with Dr. Rogene Houston who agrees with treatment plan.   Final Clinical Impressions(s) / ED Diagnoses   Final diagnoses:  Febrile illness    ED Discharge Orders    None       Thyra Yinger, Ozella Almond, PA-C 11/24/18 1509    Fredia Sorrow, MD 11/26/18 [phone]

## 2018-11-24 NOTE — Progress Notes (Signed)
Pharmacy Antibiotic Note  Nicholas Lowery is a 58 y.o. male with metastatic colon cancer with plan to start [place] in about two weeks per heme/onc office note on 11/19/18, presented to the ED on 11/24/2018 with c/o fever, n/v, cough and SOB.  To start vancomycin and aztreonam for sepsis.  - of note, pt has PCN listed as an allergy. No hx of cephalosporin use charted in EPIC  Plan: - vancomycin 1250 mg IV q24h for est AUC 464 - aztreonam 2gm IV q8h - monitor renal function closely  _____________________________________  Height: 6\' 3"  (190.5 cm) Weight: 176 lb 5.9 oz (80 kg) IBW/kg (Calculated) : 84.5  Temp (24hrs), Avg:101.2 F (38.4 C), Min:100.3 F (37.9 C), Max:101.7 F (38.7 C)  Recent Labs  Lab 11/19/18 0854 11/24/18 1200  WBC  --  4.3  CREATININE 1.38* 1.85*  LATICACIDVEN  --  0.8    Estimated Creatinine Clearance: 51.1 mL/min (A) (by C-G formula based on SCr of 1.85 mg/dL (H)).    Allergies  Allergen Reactions  . Penicillins Other (See Comments)    UNSPECIFIED REACTION   Has patient had a PCN reaction causing immediate rash, facial/tongue/throat swelling, SOB or lightheadedness with hypotension: unknown Has patient had a PCN reaction causing severe rash involving mucus membranes or skin necrosis: unknown Has patient had a PCN reaction that required hospitalization unknown Has patient had a PCN reaction occurring within the last 10 years: No If all of the above answers are "NO", then may proceed with Cephalosporin use.   [name] [Capecitabine] Nausea And Vomiting and Other (See Comments)    Foot pain, patient couldn't walk, and bleeding gums  . Nivolumab Nausea And Vomiting, Palpitations and Other (See Comments)    Infusion reaction: Chills, rigors, numbness. Symptoms improved with ondansetron, clonidine, famotidine, meperidine    Thank you for allowing pharmacy to be a part of this patient's care.  Lynelle Doctor 11/24/2018 3:02 PM

## 2018-11-24 NOTE — ED Notes (Signed)
Pt has been drinking oral contrast, is to go for CT at 1800- then to the floor after that.

## 2018-11-25 ENCOUNTER — Encounter (HOSPITAL_COMMUNITY): Payer: Self-pay | Admitting: Family Medicine

## 2018-11-25 ENCOUNTER — Inpatient Hospital Stay (HOSPITAL_COMMUNITY): Payer: BC Managed Care – PPO

## 2018-11-25 DIAGNOSIS — J189 Pneumonia, unspecified organism: Secondary | ICD-10-CM | POA: Diagnosis present

## 2018-11-25 DIAGNOSIS — J90 Pleural effusion, not elsewhere classified: Secondary | ICD-10-CM

## 2018-11-25 LAB — BASIC METABOLIC PANEL
Anion gap: 8 (ref 5–15)
BUN: 32 mg/dL — ABNORMAL HIGH (ref 6–20)
CO2: 18 mmol/L — ABNORMAL LOW (ref 22–32)
Calcium: 7.6 mg/dL — ABNORMAL LOW (ref 8.9–10.3)
Chloride: 109 mmol/L (ref 98–111)
Creatinine, Ser: 1.71 mg/dL — ABNORMAL HIGH (ref 0.61–1.24)
GFR calc Af Amer: 51 mL/min — ABNORMAL LOW (ref >60)
GFR calc non Af Amer: 44 mL/min — ABNORMAL LOW (ref >60)
Glucose, Bld: 96 mg/dL (ref 70–99)
Potassium: 4.1 mmol/L (ref 3.5–5.1)
Sodium: 135 mmol/L (ref 135–145)

## 2018-11-25 LAB — GRAM STAIN

## 2018-11-25 LAB — CBC
HCT: 27.6 % — ABNORMAL LOW (ref 39.0–52.0)
Hemoglobin: 8.4 g/dL — ABNORMAL LOW (ref 13.0–17.0)
MCH: 29.1 pg (ref 26.0–34.0)
MCHC: 30.4 g/dL (ref 30.0–36.0)
MCV: 95.5 fL (ref 80.0–100.0)
Platelets: 160 10*3/uL (ref 150–400)
RBC: 2.89 MIL/uL — ABNORMAL LOW (ref 4.22–5.81)
RDW: 15.9 % — ABNORMAL HIGH (ref 11.5–15.5)
WBC: 2.9 10*3/uL — ABNORMAL LOW (ref 4.0–10.5)
nRBC: 0 % (ref 0.0–0.2)

## 2018-11-25 LAB — BODY FLUID CELL COUNT WITH DIFFERENTIAL
Eos, Fluid: 12 %
Lymphs, Fluid: 47 %
Monocyte-Macrophage-Serous Fluid: 40 % — ABNORMAL LOW (ref 50–90)
Neutrophil Count, Fluid: 1 % (ref 0–25)
Total Nucleated Cell Count, Fluid: 464 cu mm (ref 0–1000)

## 2018-11-25 LAB — URINE CULTURE: Culture: NO GROWTH

## 2018-11-25 LAB — OCCULT BLOOD X 1 CARD TO LAB, STOOL: Fecal Occult Bld: POSITIVE — AB

## 2018-11-25 MED ORDER — SODIUM CHLORIDE 0.9 % IV SOLN: 2.0000 g | Freq: Two times a day (BID) | INTRAVENOUS | Status: DC

## 2018-11-25 MED ORDER — ENSURE ENLIVE PO LIQD
237.0000 mL | Freq: Two times a day (BID) | ORAL | Status: DC
  Administered 2018-11-25 – 2018-11-26 (×3): 237 mL via ORAL

## 2018-11-25 MED ORDER — ALBUTEROL SULFATE (2.5 MG/3ML) 0.083% IN NEBU: 2.5000 mg | INHALATION_SOLUTION | Freq: Four times a day (QID) | RESPIRATORY_TRACT | Status: DC | PRN

## 2018-11-25 MED ORDER — SODIUM CHLORIDE 0.9 % IV SOLN
2.0000 g | Freq: Two times a day (BID) | INTRAVENOUS | Status: DC
  Administered 2018-11-25 – 2018-11-26 (×3): 2 g via INTRAVENOUS
  Filled 2018-11-25 (×3): qty 2

## 2018-11-25 MED ORDER — SODIUM CHLORIDE 0.9% FLUSH: 10.0000 mL | INTRAVENOUS | Status: DC | PRN

## 2018-11-25 MED ORDER — GABAPENTIN 100 MG PO CAPS
200.0000 mg | ORAL_CAPSULE | Freq: Three times a day (TID) | ORAL | Status: DC
  Administered 2018-11-25 – 2018-11-27 (×7): 200 mg via ORAL
  Filled 2018-11-25 (×7): qty 2

## 2018-11-25 MED ORDER — CALCIUM GLUCONATE-NACL 1-0.675 GM/50ML-% IV SOLN
1.0000 g | Freq: Once | INTRAVENOUS | Status: AC
  Administered 2018-11-25: 06:00:00 1000 mg via INTRAVENOUS
  Filled 2018-11-25: qty 50

## 2018-11-25 MED ORDER — ADULT MULTIVITAMIN W/MINERALS CH
1.0000 | ORAL_TABLET | Freq: Every day | ORAL | Status: DC
  Administered 2018-11-25 – 2018-11-26 (×2): 1 via ORAL
  Filled 2018-11-25 (×2): qty 1

## 2018-11-25 MED ORDER — LIDOCAINE HCL 1 % IJ SOLN
INTRAMUSCULAR | Status: AC
  Filled 2018-11-25: qty 20

## 2018-11-25 NOTE — Progress Notes (Signed)
11/25/2018 3:55 PM  Notified of RN of heme positive stools.  Will request GI consultation unassigned for [facility].   Murvin Natal MD

## 2018-11-25 NOTE — TOC Initial Note (Signed)
Transition of Care [facility]) - Initial/Assessment Note    Patient Details  Name: Nicholas Lowery MRN: [id number] Date of Birth: 1960/05/20  Transition of Care [facility]) CM/SW Contact:    Nila Nephew, LCSW Phone Number: [phone] 11/25/2018, 2:23 PM  Clinical Narrative:         Completed High Risk Readmission screening due to score 27%.   Pt admitted with pneumonia, also pt of [place] with metastatic colon cancer. Please consult TOC team if needs arise.           Activities of Daily Living Home Assistive Devices/Equipment: None ADL Screening (condition at time of admission) Patient's cognitive ability adequate to safely complete daily activities?: Yes Is the patient deaf or have difficulty hearing?: No Does the patient have difficulty seeing, even when wearing glasses/contacts?: No Does the patient have difficulty concentrating, remembering, or making decisions?: No Patient able to express need for assistance with ADLs?: No Does the patient have difficulty dressing or bathing?: No Independently performs ADLs?: Yes (appropriate for developmental age) Does the patient have difficulty walking or climbing stairs?: No Weakness of Legs: None Weakness of Arms/Hands: None  Permission Sought/Granted                  Emotional Assessment              Admission diagnosis:  Febrile illness [R50.9] Metastatic colon cancer to liver ([place]) [C18.9, C78.7] Sepsis with acute renal failure without septic shock, due to unspecified organism, unspecified acute renal failure type ([place]) [A41.9, R65.20, N17.9] Patient Active Problem List   Diagnosis Date Noted  . HCAP (healthcare-associated pneumonia) 11/25/2018  . Pleural effusion   . Sepsis ([place]) 11/24/2018  . Anxiety and depression 10/01/2018  . DNR (do not resuscitate) 09/04/2018  . Cancer related pain 08/30/2018  . Advanced care planning/counseling discussion   . Palliative care by specialist   . Anxiety associated with cancer  diagnosis   . Peripheral neuropathy due to chemotherapy ([place]) 12/13/2017  . HTN (hypertension) 09/06/2016  . Port catheter in place 08/10/2016  . Iron deficiency anemia due to chronic blood loss 07/05/2016  . Goals of care, counseling/discussion 07/05/2016  . Diarrhea 06/06/2016  . Elevated blood pressure reading 06/06/2016  . Metastatic colon cancer to liver ([place]) 06/01/2016   PCP:  Curlene Labrum, MD Pharmacy:   [place], [place] - [address] [place] [address] Phone: [phone] Fax: [place], [place] [address] [place] [address] Phone: [phone] Fax: [phone]  [place], [place] - [place] [address] Phone: [phone] Fax: [phone]     Social Determinants of Health (SDOH) Interventions    Readmission Risk Interventions Readmission Risk Prevention Plan 11/25/2018  Transportation Screening Complete  PCP or Specialist Appt within 3-5 Days Complete  HRI or [place] Not Complete  HRI or Home Care Consult comments no indication  Social Work Consult for [place] Planning/Counseling Not Complete  SW consult not completed comments no indication  Palliative Care Screening Not Applicable  Medication Review Press photographer) Not Complete  Med Review Comments some PTA medications need review per RN admission summary

## 2018-11-25 NOTE — Procedures (Signed)
PROCEDURE SUMMARY:  Successful US guided diagnostic and therapeutic right thoracentesis. Yielded 1.3 liters of clear, yellow fluid. Pt tolerated procedure well. No immediate complications.  Specimen was sent for labs. CXR ordered.  EBL < 5 mL  Docia Barrier PA-C 11/25/2018 1:54 PM

## 2018-11-25 NOTE — Progress Notes (Addendum)
PROGRESS NOTE    Nicholas Lowery  PYK:[id number]  DOB: 1960/10/22  DOA: 11/24/2018 PCP: Curlene Labrum, MD   Brief Admission Hx: 58 y/o male with metastatic colon cancer, hypertension, anxiety presented with shortness of breath and fever.  MDM/Assessment & Plan:   1. Nosocomial pneumonia-patient is responding to treatments.  Change antibiotics to cefepime to cover strep, discontinue vancomycin as MRSA screening is negative.  Continue supportive therapy. 2. AKI - continue IV fluid hydration.  Follow BMP.  3. Sepsis - secondary to HCAP - resolved with treatments.  4. Anemia in neoplastic disease - following CBC closely.  Stool hemoccult pending.   5. 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 notifying of admission. 6. Chronic pain - resumed home medications.  7. GERD - protonix ordered for GI protection.  8. Depression/anxiety - continue venlafaxine/lorazepam.  DVT prophylaxis: heparin  Code Status: full  Family Communication: wife telephone update Disposition Plan: transfer to floor, possible discharge home 7/14   Consultants:  His oncologist [name] has seen him this admission   Procedures:    Antimicrobials:  Vancomycin/aztreonam 7/12>7/13  Cefepime 7/13 >   Subjective: Pt says he is much less SOB today.  No chest pain.  No rigors or diarrhea.  Abdominal pain is better this morning.   Objective: Vitals:   11/25/18 0300 11/25/18 0319 11/25/18 0400 11/25/18 0837  BP:  98/68 103/68   Pulse: 89 88 91   Resp: 15 14 12    Temp:   97.7 F (36.5 C) (!) 97.5 F (36.4 C)  TempSrc:   Oral Oral  SpO2: 95% 95% 91% 96%  Weight:      Height:        Intake/Output Summary (Last 24 hours) at 11/25/2018 5397 Last data filed at 11/25/2018 6734 Gross per 24 hour  Intake 1052.41 ml  Output --  Net 1052.41 ml   Filed Weights   11/24/18 1121 11/24/18 1832  Weight: 80 kg 90.1 kg     REVIEW OF SYSTEMS  As per history  otherwise all reviewed and reported negative  Exam:  General exam: awake, alert, NAD, cooperative.   Respiratory system: bilateral expiratory wheezes heard.  No increased work of breathing. Cardiovascular system: S1 & S2 heard. No JVD, murmurs, gallops, clicks or pedal edema. Gastrointestinal system: Abdomen is nondistended, soft and nontender. Normal bowel sounds heard. Central nervous system: Alert and oriented. No focal neurological deficits. Extremities: no CCE.  Data Reviewed: Basic Metabolic Panel: Recent Labs  Lab 11/19/18 0854 11/24/18 1200 11/24/18 1728 11/25/18 0248  NA 138 134* 133* 135  K 4.4 4.8 4.5 4.1  CL 107 107 107 109  CO2 22 19* 19* 18*  GLUCOSE 101* 124* 109* 96  BUN 19 35* 32* 32*  CREATININE 1.38* 1.85* 1.79* 1.71*  CALCIUM 8.5* 8.3* 7.7* 7.6*   Liver Function Tests: Recent Labs  Lab 11/19/18 0854 11/24/18 1200 11/24/18 1728  AST 24 27 28   ALT 18 22 22   ALKPHOS 75 70 67  BILITOT 0.4 0.6 0.5  PROT 6.8 6.3* 5.8*  ALBUMIN 3.6 3.2* 3.0*   No results for input(s): LIPASE, AMYLASE in the last 168 hours. No results for input(s): AMMONIA in the last 168 hours. CBC: Recent Labs  Lab 11/24/18 1200 11/25/18 0248  WBC 4.3 2.9*  NEUTROABS 3.3  --   HGB 8.9* 8.4*  HCT 29.0* 27.6*  MCV 94.5 95.5  PLT 166 160   Cardiac Enzymes: No results for  input(s): CKTOTAL, CKMB, CKMBINDEX, TROPONINI in the last 168 hours. CBG (last 3)  No results for input(s): GLUCAP in the last 72 hours. Recent Results (from the past 240 hour(s))  Novel Coronavirus, NAA ([facility])     Status: None   Collection Time: 11/19/18  8:24 AM  Result Value Ref Range Status   SARS-CoV-2, NAA Not Detected Not Detected Final    Comment: Testing was performed using the cobas(R) SARS-CoV-2 test. This test was developed and its performance characteristics determined by [employer]. This test has not been FDA cleared or approved. This test has been authorized by FDA under an  Emergency Use Authorization (EUA). This test is only authorized for the duration of time the declaration that circumstances exist justifying the authorization of the emergency use of in vitro diagnostic tests for detection of SARS-CoV-2 virus and/or diagnosis of COVID-19 infection under section 564(b)(1) of the Act, 21 U.S.C. 161WRU-0(A)(5), unless the authorization is terminated or revoked sooner. When diagnostic testing is negative, the possibility of a false negative result should be considered in the context of a patient's recent exposures and the presence of clinical signs and symptoms consistent with COVID-19. An individual without symptoms of COVID-19 and who is not shedding SARS-CoV-2 virus would expect to have a negati ve (not detected) result in this assay.   Blood Culture (routine x 2)     Status: None (Preliminary result)   Collection Time: 11/24/18 11:33 AM   Specimen: BLOOD  Result Value Ref Range Status   Specimen Description   Final    BLOOD SITE NOT SPECIFIED Performed at [facility], [address], [place], [place] [address]    Special Requests   Final    BOTTLES DRAWN AEROBIC AND ANAEROBIC Blood Culture adequate volume Performed at [facility] [address] [place], [place] [address]    Culture PENDING  Incomplete   Report Status PENDING  Incomplete  SARS Coronavirus 2 (CEPHEID- Performed in [facility] hospital lab), Hosp Order     Status: None   Collection Time: 11/24/18 12:53 PM   Specimen: Nasopharyngeal Swab  Result Value Ref Range Status   SARS Coronavirus 2 NEGATIVE NEGATIVE Final    Comment: (NOTE) If result is NEGATIVE SARS-CoV-2 target nucleic acids are NOT DETECTED. The SARS-CoV-2 RNA is generally detectable in upper and lower  respiratory specimens during the acute phase of infection. The lowest  concentration of SARS-CoV-2 viral copies this assay can detect is 250  copies / mL. A negative result does not preclude  SARS-CoV-2 infection  and should not be used as the sole basis for treatment or other  patient management decisions.  A negative result may occur with  improper specimen collection / handling, submission of specimen other  than nasopharyngeal swab, presence of viral mutation(s) within the  areas targeted by this assay, and inadequate number of viral copies  (<250 copies / mL). A negative result must be combined with clinical  observations, patient history, and epidemiological information. If result is POSITIVE SARS-CoV-2 target nucleic acids are DETECTED. The SARS-CoV-2 RNA is generally detectable in upper and lower  respiratory specimens dur ing the acute phase of infection.  Positive  results are indicative of active infection with SARS-CoV-2.  Clinical  correlation with patient history and other diagnostic information is  necessary to determine patient infection status.  Positive results do  not rule out bacterial infection or co-infection with other viruses. If result is PRESUMPTIVE POSTIVE SARS-CoV-2 nucleic acids MAY  BE PRESENT.   A presumptive positive result was obtained on the submitted specimen  and confirmed on repeat testing.  While 2019 novel coronavirus  (SARS-CoV-2) nucleic acids may be present in the submitted sample  additional confirmatory testing may be necessary for epidemiological  and / or clinical management purposes  to differentiate between  SARS-CoV-2 and other Sarbecovirus currently known to infect humans.  If clinically indicated additional testing with an alternate test  methodology [phone]) is advised. The SARS-CoV-2 RNA is generally  detectable in upper and lower respiratory sp ecimens during the acute  phase of infection. The expected result is Negative. Fact Sheet for Patients:  StrictlyIdeas.no Fact Sheet for Healthcare Providers: BankingDealers.co.za This test is not yet approved or cleared by the  [place] FDA and has been authorized for detection and/or diagnosis of SARS-CoV-2 by FDA under an Emergency Use Authorization (EUA).  This EUA will remain in effect (meaning this test can be used) for the duration of the COVID-19 declaration under Section 564(b)(1) of the Act, 21 U.S.C. section 360bbb-3(b)(1), unless the authorization is terminated or revoked sooner. Performed at [facility], [place] [address], [place], [place] [address]   MRSA PCR Screening     Status: None   Collection Time: 11/24/18  6:53 PM   Specimen: Nasopharyngeal  Result Value Ref Range Status   MRSA by PCR NEGATIVE NEGATIVE Final    Comment:        The GeneXpert MRSA Assay (FDA approved for NASAL specimens only), is one component of a comprehensive MRSA colonization surveillance program. It is not intended to diagnose MRSA infection nor to guide or monitor treatment for MRSA infections. Performed at [facility], [place] [address] [place], [place] [address]      Studies: Ct Angio Chest Pe W Or Wo Contrast  Result Date: 11/24/2018 CLINICAL DATA:  History of metastatic colon carcinoma with fever and abdominal pain EXAM: CT ANGIOGRAPHY CHEST CT ABDOMEN AND PELVIS WITH CONTRAST TECHNIQUE: Multidetector CT imaging of the chest was performed using the standard protocol during bolus administration of intravenous contrast. Multiplanar CT image reconstructions and MIPs were obtained to evaluate the vascular anatomy. Multidetector CT imaging of the abdomen and pelvis was performed using the standard protocol during bolus administration of intravenous contrast. CONTRAST:  109mL OMNIPAQUE IOHEXOL 350 MG/ML SOLN COMPARISON:  Similar exam dated 11/13/2018 FINDINGS: CTA CHEST FINDINGS Cardiovascular: Thoracic aorta demonstrates a normal branching pattern. No aneurysmal dilatation or dissection is seen. The heart is normal in size and configuration. The pulmonary artery shows a normal  branching pattern. No definitive filling defect to suggest pulmonary embolism is noted. Mediastinum/Nodes: Thoracic inlet is within normal limits. The esophagus is within normal limits. Scattered mediastinal lymph nodes are again identified similar to that seen on the recent restaging examination from 11 days previous. Stable right hilar lymph node is noted as well. Lungs/Pleura: The lungs are well aerated bilaterally. Nodules are again identified bilaterally. The overall appearance is stable from the prior exam from 11 days previous. An enlarging right-sided pleural effusion is seen. New superimposed right lower lobe and right middle lobe infiltrate is seen. Musculoskeletal: Degenerative changes of the thoracic spine are noted. No metastatic lesions are seen. Left-sided chest wall port is noted Review of the MIP images confirms the above findings. CT ABDOMEN and PELVIS FINDINGS Hepatobiliary: Liver again demonstrates a hypodense lesion within the right lobe consistent with metastatic disease measuring 4.2 cm in greatest dimension. Small hypodensity in the dome of  the liver is noted also stable from the prior study. Gallbladder demonstrates vicarious excretion of contrast. No ductal dilatation is seen. Pancreas: Unremarkable. No pancreatic ductal dilatation or surrounding inflammatory changes. Spleen: Normal in size without focal abnormality. Adrenals/Urinary Tract: Adrenal glands are within normal limits. Kidneys demonstrate no renal calculi or obstructive changes bilaterally. Normal excretion of contrast is seen. The bladder is decompressed. Stomach/Bowel: Colon is well visualized. Postsurgical changes are noted in the mid transverse colon consistent with the prior surgical history. There is again noted soft tissue mass in the mesentery adjacent to the anastomotic site best seen in the coronal plane on image number 40 of series 4. Just proximal to the small bowel colon anastomotic site there is some mild  dilatation of the distal small bowel identified. Multiple edematous loops of small bowel are noted proximal to the anastomotic site. A mild partial small bowel obstruction at the anastomotic site cannot be totally excluded. Vascular/Lymphatic: No aneurysmal dilatation is seen. The encasement of the superior mesenteric artery and vein by the adjacent mesenteric mass is less well visualized on today's exam due to the timing of the contrast bolus. There does remain however venous engorgement of the small bowel contributing to the degree of small bowel wall edema and likely ascites. Reproductive: Prostate is unremarkable. Other: Increase in ascites is noted both in the pelvis as well as surrounding the bowel loops and liver. Peritoneal implant is again noted near the umbilicus. Musculoskeletal: Degenerative changes of lumbar spine are seen. Review of the MIP images confirms the above findings. IMPRESSION: CTA of the chest: No evidence of pulmonary emboli are identified. Stable right hilar and mediastinal adenopathy is seen. Stable metastatic lesions within both lungs. Increasing right-sided pleural effusion with associated right lower and middle lobe infiltrates CT of the abdomen and pelvis: Postsurgical changes consistent with the given clinical history. There remains a mesenteric mass near the anastomotic site similar to that seen on the recent exam. Stable appearing hepatic metastatic disease is noted. Persistent venous engorgement is noted related to vascular encasement with new small bowel edematous changes identified. Some mild dilatation of the distal small bowel proximal to the anastomosis is seen. The possibility of mild narrowing at the anastomosis deserves consideration. This was not present on the most recent exam. This may be accentuated by the venous engorgement and small-bowel wall edema. Increasing ascites. There are changes consistent with peritoneal implantation stable from the prior exam.  Electronically Signed   By: Inez Catalina M.D.   On: 11/24/2018 18:13   Ct Abdomen Pelvis W Contrast  Result Date: 11/24/2018 CLINICAL DATA:  History of metastatic colon carcinoma with fever and abdominal pain EXAM: CT ANGIOGRAPHY CHEST CT ABDOMEN AND PELVIS WITH CONTRAST TECHNIQUE: Multidetector CT imaging of the chest was performed using the standard protocol during bolus administration of intravenous contrast. Multiplanar CT image reconstructions and MIPs were obtained to evaluate the vascular anatomy. Multidetector CT imaging of the abdomen and pelvis was performed using the standard protocol during bolus administration of intravenous contrast. CONTRAST:  56mL OMNIPAQUE IOHEXOL 350 MG/ML SOLN COMPARISON:  Similar exam dated 11/13/2018 FINDINGS: CTA CHEST FINDINGS Cardiovascular: Thoracic aorta demonstrates a normal branching pattern. No aneurysmal dilatation or dissection is seen. The heart is normal in size and configuration. The pulmonary artery shows a normal branching pattern. No definitive filling defect to suggest pulmonary embolism is noted. Mediastinum/Nodes: Thoracic inlet is within normal limits. The esophagus is within normal limits. Scattered mediastinal lymph nodes are again identified similar to that  seen on the recent restaging examination from 11 days previous. Stable right hilar lymph node is noted as well. Lungs/Pleura: The lungs are well aerated bilaterally. Nodules are again identified bilaterally. The overall appearance is stable from the prior exam from 11 days previous. An enlarging right-sided pleural effusion is seen. New superimposed right lower lobe and right middle lobe infiltrate is seen. Musculoskeletal: Degenerative changes of the thoracic spine are noted. No metastatic lesions are seen. Left-sided chest wall port is noted Review of the MIP images confirms the above findings. CT ABDOMEN and PELVIS FINDINGS Hepatobiliary: Liver again demonstrates a hypodense lesion within the  right lobe consistent with metastatic disease measuring 4.2 cm in greatest dimension. Small hypodensity in the dome of the liver is noted also stable from the prior study. Gallbladder demonstrates vicarious excretion of contrast. No ductal dilatation is seen. Pancreas: Unremarkable. No pancreatic ductal dilatation or surrounding inflammatory changes. Spleen: Normal in size without focal abnormality. Adrenals/Urinary Tract: Adrenal glands are within normal limits. Kidneys demonstrate no renal calculi or obstructive changes bilaterally. Normal excretion of contrast is seen. The bladder is decompressed. Stomach/Bowel: Colon is well visualized. Postsurgical changes are noted in the mid transverse colon consistent with the prior surgical history. There is again noted soft tissue mass in the mesentery adjacent to the anastomotic site best seen in the coronal plane on image number 40 of series 4. Just proximal to the small bowel colon anastomotic site there is some mild dilatation of the distal small bowel identified. Multiple edematous loops of small bowel are noted proximal to the anastomotic site. A mild partial small bowel obstruction at the anastomotic site cannot be totally excluded. Vascular/Lymphatic: No aneurysmal dilatation is seen. The encasement of the superior mesenteric artery and vein by the adjacent mesenteric mass is less well visualized on today's exam due to the timing of the contrast bolus. There does remain however venous engorgement of the small bowel contributing to the degree of small bowel wall edema and likely ascites. Reproductive: Prostate is unremarkable. Other: Increase in ascites is noted both in the pelvis as well as surrounding the bowel loops and liver. Peritoneal implant is again noted near the umbilicus. Musculoskeletal: Degenerative changes of lumbar spine are seen. Review of the MIP images confirms the above findings. IMPRESSION: CTA of the chest: No evidence of pulmonary emboli are  identified. Stable right hilar and mediastinal adenopathy is seen. Stable metastatic lesions within both lungs. Increasing right-sided pleural effusion with associated right lower and middle lobe infiltrates CT of the abdomen and pelvis: Postsurgical changes consistent with the given clinical history. There remains a mesenteric mass near the anastomotic site similar to that seen on the recent exam. Stable appearing hepatic metastatic disease is noted. Persistent venous engorgement is noted related to vascular encasement with new small bowel edematous changes identified. Some mild dilatation of the distal small bowel proximal to the anastomosis is seen. The possibility of mild narrowing at the anastomosis deserves consideration. This was not present on the most recent exam. This may be accentuated by the venous engorgement and small-bowel wall edema. Increasing ascites. There are changes consistent with peritoneal implantation stable from the prior exam. Electronically Signed   By: Inez Catalina M.D.   On: 11/24/2018 18:13   Dg Chest Port 1 View  Result Date: 11/24/2018 CLINICAL DATA:  Fever, cough, shortness of breath, nausea. EXAM: PORTABLE CHEST 1 VIEW COMPARISON:  November 05, 2018 FINDINGS: Left subclavian approach injectable port in stable position. Cardiomediastinal silhouette is mildly enlarged.  Mediastinal contours appear intact. The left lung is clear. Right cardiophrenic airspace opacity and probable right pleural effusion. Previously noted by CT small pulmonary nodules are not seen radiographically. Osseous structures are without acute abnormality. Soft tissues are grossly normal. IMPRESSION: Right cardiophrenic airspace opacity and probable right pleural effusion. Electronically Signed   By: Fidela Salisbury M.D.   On: 11/24/2018 12:19    Scheduled Meds:  budesonide  0.5 mg Nebulization BID   Chlorhexidine Gluconate Cloth  6 each Topical Daily   feeding supplement (ENSURE ENLIVE)  237 mL Oral  BID BM   fentaNYL  1 patch Transdermal Q72H   gabapentin  200 mg Oral TID   heparin  5,000 Units Subcutaneous Q8H   levalbuterol  0.63 mg Nebulization TID   pantoprazole  40 mg Oral Daily   polyethylene glycol  17 g Oral BID   senna-docusate  1 tablet Oral BID   venlafaxine XR  150 mg Oral Q breakfast   Continuous Infusions:  sodium chloride Stopped (11/25/18 0636)   ceFEPime (MAXIPIME) IV      Principal Problem:   Sepsis ([place]) Active Problems:   Metastatic colon cancer to liver ([place])   HCAP (healthcare-associated pneumonia)  Critical Care Time spent: 59 minutes   Irwin Brakeman, MD [facility] 11/25/2018, 9:22 AM    LOS: 1 day  How to contact the [facility] Attending or Consulting provider [place] or covering provider during after hours [place], for this patient?  1. Check the care team in [facility] and look for a) attending/consulting [facility] provider listed and b) the [facility] team listed 2. Log into [email or web address] and use [place]'s universal password to access. If you do not have the password, please contact the hospital operator. 3. Locate the [facility] provider you are looking for under [facility] and page to a number that you can be directly reached. 4. If you still have difficulty reaching the provider, please page the [facility] (Director on Call) for the Hospitalists listed on amion for assistance.

## 2018-11-25 NOTE — Progress Notes (Signed)
Initial Nutrition Assessment  RD working remotely.   DOCUMENTATION CODES:   (unable to assess for malnutrition at this time.)  INTERVENTION:  - will order [employer] with breakfast and lunch meals (to be mixed with whole milk), each supplement will provide 240 kcal and 13 grams protein. - will order daily multivitamin with minerals. - continue to encourage PO intakes.    NUTRITION DIAGNOSIS:   Increased nutrient needs related to chronic illness, catabolic illness, cancer and cancer related treatments as evidenced by estimated needs.  GOAL:   Patient will meet greater than or equal to 90% of their needs  MONITOR:   PO intake, Supplement acceptance, Labs, Weight trends  REASON FOR ASSESSMENT:   Malnutrition Screening Tool  ASSESSMENT:   58 y.o. male with medical history significant for metastatic colon cancer to the liver, HTN, and anxiety. He presented to the ED complaining of productive cough, shortness of breath for the past few weeks, and fever which started on 7/11.  Patient also complained of abdominal cramping and nausea but denied any vomiting. Patient saw his oncologist and was started on Bactrim about 5 days ago, with no significant improvement. On presentation, patient noted to be febrile, tachycardic, saturating above 90 on room air. AKI noted with creatinine of 1.85. CXR showed right cardiophrenic airspace opacity and probable right pleural effusion.  No intakes documented since admission. Per chart review, current weight is 199 lb and weight on 7/2 was 176 lb. Used weight from 7/2 to estimate nutrition needs as it is more consistent with trend. Weight has been slowly trending down since 2/3 at which time he weighed 202 lb. This indicates 26 lb weight loss (13% body weight) in the past 5 months; significant for time frame.  Patient is being followed by [facility] and was last seen on 5/20. Patient reports that since that time appetite has remained  poor and he continues to have lack of desire to eat or drink. At that time he was consuming [employer] with 2% milk for breakfast, skipping lunch, and dinner was variable. Patient reports that this is mainly consistent with eating habits now. That if he eats a full meal it is only once/day.   Patient underweight R thoracentesis earlier this afternoon and 1.3L clear, yellow fluid was removed.   MD note from today states plan to transfer to the floor from SDU and possible d/c home on 7/14.    Medications reviewed; 1 packet miralax BID, 1 tablet senokot BID Labs reviewed; BUN: 32 mg/dl, creatinine: 1.71 mg/dl, Ca: 7.6 mg/dl, GFR: 44 ml/min.  IVF; NS @ 75 ml/hr.      NUTRITION - FOCUSED PHYSICAL EXAM:  unable to complete at this time.   Diet Order:   Diet Order            Diet Heart Room service appropriate? Yes; Fluid consistency: Thin  Diet effective now              EDUCATION NEEDS:   No education needs have been identified at this time  Skin:  Skin Assessment: Reviewed RN Assessment  Last BM:  7/13  Height:   Ht Readings from Last 1 Encounters:  11/24/18 5\' 10"  (1.778 m)    Weight:   Wt Readings from Last 1 Encounters:  11/24/18 90.1 kg    Ideal Body Weight:  75.4 kg  BMI:  Body mass index is 28.5 kg/m.  Estimated Nutritional Needs:   Kcal:  2300-2550 kcal  Protein:  12-135 grams  Fluid:  >/= 2.3 L/day      Jarome Matin, MS, RD, LDN, [facility] Inpatient Clinical Dietitian Pager # [phone] After hours/weekend pager # [phone]

## 2018-11-25 NOTE — Progress Notes (Addendum)
HEMATOLOGY-ONCOLOGY PROGRESS NOTE  SUBJECTIVE: Patient admitted secondary to fever, cough, shortness of breath and abdominal cramping.  Reports that he had a fever up to 103 at home.  He denies having chills but states that he was cold.  He reports having a productive cough with yellow/green sputum.  Reports some shortness of breath as well.  Reports pain to his right posterior ribs.  He denies bright red blood but states that his stools were black yesterday.  He is anxious and tearful this morning.  Oncology History Overview Note  Cancer Staging Metastatic colon cancer to liver [facility]) Staging form: Colon and Rectum, AJCC 8th Edition - Clinical stage from 06/01/2016: Stage IVA (cTX, cNX, pM1a) - Signed by Truitt Merle, MD on 07/04/2016 - Pathologic stage from 06/14/2016: Stage IVA (pT4b(m), pN2b, pM1a) - Signed by Truitt Merle, MD on 07/04/2016     Metastatic colon cancer to liver ([place])  04/2015 Procedure   Colonoscopy by Dr. Ladona Horns. It showed showed 2 sessile polyps ready between 3-5 mm in size located 20 cm (A, B) from the point of entry, polypectomy was performed. Pedunculated polyp was found in the ascending colon (C), polypectomy was performed, and additional polyp (D) was found 30 cm from the point of entry, removed   04/2015 Pathology Results   tubular adenoma (A and B), and well differentiated adenocarcinoma arising from tubulovillous adenoma (C) and well differentiated adenocarcinoma arising from severe dysplasia to intramucosal carcinoma within tubular adenoma.    04/2015 Initial Diagnosis   Metastatic colon cancer to liver ([place])   05/29/2016 Imaging   CT abdomen and pelvis with contrast showed an apple core like stricture in right colon just above the cecum, measuring 3.2 cm in lengths. This is highly suspicious for malignancy. Small lymph node a noticed he had adjacent mesentery, measuring 8 mm. There is a low-density lesion within the inferior aspect of the right hepatic lobe measuring 1.7 cm,  suspicious for metastasis.    06/06/2016 Tumor Marker   CEA 9.99   06/08/2016 PET scan   IMPRESSION: Approximately 3 cm hypermetabolic mass in the ascending colon, consistent with primary colon carcinoma. This mass results in colonic obstruction and small bowel dilatation. Additional areas of hypermetabolic wall thickening in the cecum may represent other sites of colon carcinoma or colitis. Mild hypermetabolic lymphadenopathy in right pericolonic region, porta hepatis, and aortocaval space, consistent with metastatic disease. Mild hypermetabolic mediastinal lymphadenopathy also seen, and thoracic lymph node metastases cannot be excluded. Solitary hypermetabolic focus in inferior right hepatic lobe, consistent with liver metastasis. Consider abdomen MRI without and with contrast for further evaluation.   06/14/2016 Surgery   Hand assisted right hemicolectomy and small bowel resection for colon cancer, liver biopsy, by Dr. Barry Dienes   06/14/2016 Pathology Results   Right hemicolectomy showed invasive well to moderately differentiated adenocarcinoma, 2 foci measuring 7.5 cm and 4.5 cm, tumor invades through full thickness of colon, to the seroma and involve the Small Bowel, Surgical Margins Were Negative, 24 Out Of 64 Lymph Nodes Were Positive, Extracapsular Extension Identified, Multiple Satellite Tumor Deposits Present, Liver Biopsy Showed Metastatic Adenocarcinoma.     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 No BRAF and NRAS mutation. MSI-stable, tumor burden low.   07/06/2016 Tumor Marker   CEA 13.69   07/13/2016 - 01/18/2017 Chemotherapy   mFOLFOX, every 2 weeks, started on 07/14/2015, Avastin added  from cycle 3  Oxaliplatin dose to '60mg'$ /m2 due to side effects and some cytopenia on 09/21/16  Changed to FOLFIRINOX starting cycle 7 and Reduced  Due to neuropathy hold  Oxaliplatin and add Irinotecan with neulasta on day 3 starting with cycle 7 Add low dose Oxaliplatin with cycle 8.  Due to his worsening neuropathy, and good response to chemotherapy, I previously stopped oxaliplatin from cycle 11, and continue FOLFIRI and avastin      07/27/2016 Tumor Marker   CEA 15.85   09/04/2016 Imaging   Ct C/A/P W Contrast IMPRESSION: Interval right colectomy. Stable small liver metastasis in the inferior right hepatic lobe. Stable mild porta hepatis and aortocaval lymphadenopathy. Stable mild mediastinal lymphadenopathy. No new or progressive metastatic disease identified within the chest, abdomen, or pelvis.   12/19/2016 PET scan   IMPRESSION: 1. Right hemicolectomy, with resolution of the prior hypermetabolic activity inferiorly in the right hepatic lobe, in several mediastinal lymph nodes, and in lymph nodes in the retroperitoneum and porta hepatis. No residual hypermetabolic or enlarged lymph nodes are identified. 2. Low-grade diffuse skeletal metabolic activity is likely therapy related. 3. Coronary atherosclerosis. 4. 3 by 4 mm right middle lobe pulmonary nodule is stable, not appreciably hypermetabolic, but below sensitive PET-CT size thresholds. This may warrant surveillance.   01/11/2017 Imaging   MR Abdomen W WO Contrast IMPRESSION: 1. No acute findings within the abdomen. Previously noted liver metastasis has resolved in the interval. No new lesions.   02/01/2017 - 09/2017 Chemotherapy   Maintenance therapy, Xeloda '2000mg'$  (1000 mg/m2)  q12h on day 1-14 every 21 days plus AVASTIN, starting 02/01/2017.  stopped after 12 days due to poor tolerance on 02/14/17  Changed to maintenance 5-FU and avastin every 2 weeks starting on 02/22/17, stopped in 09/2017.      04/10/2017 Imaging   IMPRESSION: 1. Status post right hemicolectomy without findings for recurrent tumor. 2. No worrisome hepatic lesions. Treated disease with only a small residual low  attenuation lesion in the right hepatic lobe. 3. No recurrent mediastinal or abdominal lymphadenopathy.    07/09/2017 PET scan   PET 07/09/17 IMPRESSION: 1. Status post right hemicolectomy, without findings of hypermetabolic recurrent or metastatic disease. 2. A right middle lobe pulmonary nodule is unchanged. However, there is a right lower lobe 5 mm pulmonary nodule which is felt to be new and enlarged compared to prior exams. Suspicious for an isolated pulmonary metastasis. Consider CT follow-up at 3-6 months. 3. Age advanced coronary artery atherosclerosis. Recommend assessment of coronary risk factors and consideration of medical therapy. 4. Borderline ascending aortic dilatation, 4.0 cm     10/10/2017 Imaging   IMPRESSION: 1. Several (at least 10) subcentimeter pulmonary nodules scattered in both lungs, predominantly in the lower lobes, all new/increased, most compatible with enlarging pulmonary metastases, largest 8 mm in the right lower lobe. 2. No additional findings of new or progressive metastatic disease. No recurrent adenopathy. Stable small low-attenuation lesion in the inferior right liver lobe compatible with treated metastasis. No new liver metastases. 3. Stable ectatic 4.0 cm ascending thoracic aorta. Recommend annual imaging followup by CTA or MRA.  4. Stable mild splenomegaly.    10/11/2017 - 12/2017 Chemotherapy   FOLFIRI and Avastin every 2 weeks starting 10/11/17, irnotecan stopped in 12/2017 due to disease progression     01/08/2018 Imaging   01/08/2018 CT CAP IMPRESSION: 1. Progressive hepatic and pulmonary metastatic disease. 2. Ascending Aortic aneurysm NOS (ICD10-I71.9).   01/08/2018 Progression   01/08/2018 CT CAP IMPRESSION: 1.  Progressive hepatic and pulmonary metastatic disease. 2. Ascending Aortic aneurysm NOS (ICD10-I71.9).   01/10/2018 - 03/07/2018 Chemotherapy   FOLFOX and Avastin every 2 weeks starting on 01/10/2018. Stopped due to disease  progression    03/19/2018 Progression   03/19/2018 CT CAP IMPRESSION: 1. Interval increase in size of dominant nodule within the right lower lobe. There are a few additional nodules which have increased in size. Multiple additional small bilateral pulmonary nodules are grossly similar. 2. Slight interval increase in size of lesion within the right hepatic lobe. 3. Interval increase in soft tissue at the surgical anastomosis involving the small bowel within the central abdomen. Locally recurrent disease not excluded.    03/21/2018 - 05/15/2018 Chemotherapy   Third lineXeloda '1500mg'$  BID 7 days on/7 days off and Avastin every 2 weeksstarting 04/07/18 stopped after 1 cycle and 4 days because he started Clinical trail.    05/22/2018 Imaging   CT CAP from 05/22/18 at [facility] FINDINGS:   LINES AND TUBES: None.  LOWER THORAX: Please see same day CT chest for findings above the diaphragm.Marland Kitchen  HEPATOBILIARY: Low-attenuation lesion in segment 5 has minimally increased in size from prior measuring 2.4 x 2.0 cm, previously 2.3 x 1.8 cm when remeasured (8:42). The gallbladder is present and otherwise unremarkable. No biliary dilatation.   SPLEEN: Unremarkable. PANCREAS: Unremarkable.  ADRENALS: Unremarkable. KIDNEYS/URETERS: Unremarkable.  BLADDER: Decompressed making further evaluation difficult. PELVIC/REPRODUCTIVE ORGANS: Unremarkable.  GI TRACT: No dilated or thick walled loops of bowel. Sequelae of right hemicolectomy and appendectomy.   PERITONEUM/RETROPERITONEUM AND MESENTERY: Minimally decreased spiculated mesenteric soft tissue density at the site of enteroenteric anastomosis, now measuring roughly 2.6 x 1.4 cm, previously 3.3 x 3.1 cm (8:54). There is also a mild amount of stranding near the anastomosis within the mid abdomen (8:54)   LYMPH NODES: Borderline enlarged 1.0 cm porta hepatic lymph node, previously 0.7 cm (8:26). VESSELS: The aorta is normal in caliber.  No significant calcified  atherosclerotic disease. Though the contrast timing is suboptimal for evaluation of the portal venous system, the portal venous system is appears patent. The hepatic veins and IVC are unremarkable.  BONES AND SOFT TISSUES: Small fat-containing ventral hernia..   05/24/2018 - 08/02/2018 Chemotherapy   [id number] with palbociclib Lucia Bitter) '100mg'$  3 weeks on/1 week off and binimetinib 15 mg BID starting 05/24/18. Stopped 08/02/18 due to disease progression.    08/02/2018 Imaging   CT CAP 08/02/18 at [facility] IMPRESSION:  Since 05/22/2018:  -Unchanged to mildly decreased in size segment 6 hepatic lesion.  -Unchanged irregular soft tissue density at the enteroenteric anastomosis.  -Increase in size of an irregular soft tissue density at the enterocolic anastomosis. This is new since 03/19/2018.  -Mild decrease in size of porta hepatis lymph node.  IMPRESSION:  Slight interval increase in multiple metastatic pulmonary nodules.  New likely malignant small right pleural effusion. Interval prominent right infrahilar lymph nodes; metastasis is in the differential. Mild interlobular septal thickening in the right middle and lower lobes is concerning for lymphangitic tumor spread. Attention on follow-up is suggested.     08/29/2018 Imaging   CT AP 08/29/18 IMPRESSION: 1. No acute CT findings of the abdomen or pelvis to explain abdominal pain.  2. Evidence of worsening metastatic colon malignancy status post right colon resection, including increasing mesenteric nodularity, increasing peritoneal nodularity, enlarging liver metastasis, and new and enlarging pulmonary nodules.  3. Trace perihepatic ascites, of uncertain etiology, although presumably malignant given peritoneal metastatic disease.   09/02/2018 Imaging  CT chest 09/02/18  IMPRESSION: 1. Unfortunately there is interval increase in size of bilateral pulmonary nodules consistent with progression of  pulmonary metastasis. 2. New small moderate RIGHT pleural effusion. 3. Interval increase in size of small mediastinal lymph nodes most consistent with metastatic adenopathy. 4. Interval increase in size of hepatic metastasis in the inferior RIGHT hepatic lobe compared to CT 03/19/2018.   09/04/2018 - 11/2018 Chemotherapy   oral Regorafenib '80mg'$  daily 3 weeks on, 1 week off and IV Nivolumab '3mg'$ /kg q2weeks starting 09/04/18. Nivo d/c after cycle 2 due to lack of coverage. Will stop week of 11/18/18 due to disease progression.    11/13/2018 Imaging   CT CAP W Constrast  IMPRESSION: 1. There is been interval enlargement of segment 6 liver metastases. Additionally mesenteric tumor at the enterocolonic anastomosis is mildly increased in size from previous exam. Peritoneal deposit along at the umbilicus is mildly increased in size in the interval. New ascites. Findings are concerning for mild progression of disease within the abdomen and pelvis. 2. No significant change in diffuse pulmonary metastases. Stable volume of right pleural effusion. 3. Previous index mesenteric lymph nodes are mildly increased in size in the interval. 4. New small pericardial effusion.    Chemotherapy   PENDING [place] M-F 2 weeks on/2 weeks off starting in 2 weeks      REVIEW OF SYSTEMS:   Constitutional: Reports fever up to 103 at home.  Denies chills but states that he has been cold. Eyes: Denies blurriness of vision Ears, nose, mouth, throat, and face: Denies mucositis or sore throat Respiratory: Reports shortness of breath and productive cough. Cardiovascular: Denies palpitation, chest discomfort Gastrointestinal:  Denies nausea and vomiting.  Reports having a black stool yesterday.  No obvious bleeding. Skin: Denies abnormal skin rashes Lymphatics: Denies new lymphadenopathy or easy bruising Neurological:Denies numbness, tingling or new weaknesses Behavioral/Psych: Mood is stable, no new changes   Extremities: No lower extremity edema All other systems were reviewed with the patient and are negative.  I have reviewed the past medical history, past surgical history, social history and family history with the patient and they are unchanged from previous note.   PHYSICAL EXAMINATION: ECOG PERFORMANCE STATUS: 1 - Symptomatic but completely ambulatory  Vitals:   11/25/18 0400 11/25/18 0837  BP: 103/68   Pulse: 91   Resp: 12   Temp: 97.7 F (36.5 C) (!) 97.5 F (36.4 C)  SpO2: 91% 96%   Filed Weights   11/24/18 1121 11/24/18 1832  Weight: 176 lb 5.9 oz (80 kg) 198 lb 10.2 oz (90.1 kg)    Intake/Output from previous day: 07/12 0701 - 07/13 0700 In: 1052.4 [I.V.:686.9; IV Piggyback:365.6] Out: -   GENERAL:alert, anxious and tearful OROPHARYNX: no thrush or mucositis LYMPH:  no palpable lymphadenopathy in the cervical, axillary or inguinal LUNGS: Rhonchi and wheezes noted throughout his lung fields. HEART: regular rate & rhythm and no murmurs and no lower extremity edema ABDOMEN: Positive bowel sounds, mild distention.  No pain with palpation. Musculoskeletal:no cyanosis of digits and no clubbing  NEURO: alert & oriented x 3 with fluent speech, no focal motor/sensory deficits  LABORATORY DATA:  I have reviewed the data as listed CMP Latest Ref Rng & Units 11/25/2018 11/24/2018 11/24/2018  Glucose 70 - 99 mg/dL 96 109(H) 124(H)  BUN 6 - 20 mg/dL 32(H) 32(H) 35(H)  Creatinine 0.61 - 1.24 mg/dL 1.71(H) 1.79(H) 1.85(H)  Sodium 135 - 145 mmol/L 135 133(L) 134(L)  Potassium 3.5 - 5.1 mmol/L 4.1  4.5 4.8  Chloride 98 - 111 mmol/L 109 107 107  CO2 22 - 32 mmol/L 18(L) 19(L) 19(L)  Calcium 8.9 - 10.3 mg/dL 7.6(L) 7.7(L) 8.3(L)  Total Protein 6.5 - 8.1 g/dL - 5.8(L) 6.3(L)  Total Bilirubin 0.3 - 1.2 mg/dL - 0.5 0.6  Alkaline Phos 38 - 126 U/L - 67 70  AST 15 - 41 U/L - 28 27  ALT 0 - 44 U/L - 22 22    Lab Results  Component Value Date   WBC 2.9 (L) 11/25/2018   HGB 8.4  (L) 11/25/2018   HCT 27.6 (L) 11/25/2018   MCV 95.5 11/25/2018   PLT 160 11/25/2018   NEUTROABS 3.3 11/24/2018    Dg Chest 2 View  Result Date: 11/05/2018 CLINICAL DATA:  History of metastatic colon cancer. Cough for 2-3 months. EXAM: CHEST - 2 VIEW COMPARISON:  CT chest 09/02/2018.  PA and lateral chest 02/24/2017. FINDINGS: The patient has a small right pleural effusion and basilar atelectasis. Scattered pulmonary nodules are identified, most prominent in the right lower lobe. No pneumothorax. No consolidative process. Heart size is normal. No acute or focal bony abnormality. IMPRESSION: Small right pleural effusion and pulmonary nodules. The appearance is not markedly changed compared to the prior chest CT. Electronically Signed   By: Inge Rise M.D.   On: 11/05/2018 08:58   Ct Chest W Contrast  Result Date: 11/13/2018 CLINICAL DATA:  Restaging metastatic colon cancer EXAM: CT CHEST, ABDOMEN, AND PELVIS WITH CONTRAST TECHNIQUE: Multidetector CT imaging of the chest, abdomen and pelvis was performed following the standard protocol during bolus administration of intravenous contrast. CONTRAST:  138m OMNIPAQUE IOHEXOL 300 MG/ML  SOLN COMPARISON:  CT chest 09/02/2018 and CT AP 08/29/2018 FINDINGS: CT CHEST FINDINGS Cardiovascular: The heart size appears normal. Small pericardial effusion, new. Lad coronary artery calcifications. Mediastinum/Nodes: Normal appearance of the thyroid gland. The trachea appears patent and is midline. Normal appearance of the esophagus. -index right paratracheal lymph node measures 1.2 cm, image 14/2. Previously 1.0 cm. Index right paratracheal lymph node measures 1 cm, image 16/2. Previously 0.9 cm. Index right pre-vascular node measures 0.9 cm, image 23/2. Previously 0.7 cm. Right hilar node measures 2 cm, image 33/2.  Previously 1.3 cm. Lungs/Pleura: Similar small to moderate right pleural effusion. Index posteromedial right lower lobe lung nodule measures 1 cm, image  80/6. Previously 0.9 cm. Index superior segment of left lower lobe lung nodule measures 1.9 cm, image 44/6. Unchanged. Index left upper lobe lung nodule measures 0.6 cm, image 69/6. Previously 0.5 cm. Index right upper lobe lung nodule measures 0.6 cm, image 44/6. Previously 0.7 cm. Index right middle lobe perifissural nodule measures 1 cm, image 91/6. Previously 0.8 cm. Index perifissural right lower lobe lung nodule measures 0.8 cm, image 112/6. Previously 1 cm. Musculoskeletal: No chest wall mass or suspicious bone lesions identified. CT ABDOMEN PELVIS FINDINGS Hepatobiliary: Index lesion within segment 6 measures 4.2 cm, image 72/2. Previously 3.2 cm. New, tiny nonspecific hypodensity along the dome measures 7 mm. Gallbladder normal. No biliary dilatation. Pancreas: Unremarkable. No pancreatic ductal dilatation or surrounding inflammatory changes. Spleen: The spleen is enlarged measuring 15.3 cm in cranial caudal dimension, unchanged. No focal splenic lesion. Adrenals/Urinary Tract: Normal adrenal glands. No kidney mass or hydronephrosis. Urinary bladder normal. Stomach/Bowel: The stomach is nondistended. Status post right hemicolectomy with entero colonic anastomosis. 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 Previously 4.0 x 4.8 cm. No evidence for high-grade bowel obstruction.  Vascular/Lymphatic: Normal appearance of the abdominal aorta. Tumor encasement of the ileocolic branches of the superior mesenteric artery and vein identified. There is venous congestion identified within the associated ileocolic mesentery. Portal vein remains patent. Reproductive: Prostate is unremarkable. Other: New small volume ascites identified. Peritoneal implant at the level of the umbilicus measures 2.7 by 1.4 cm, image 84/2. Previously 2.0 x 1.3 cm. Musculoskeletal: No acute or significant osseous findings. IMPRESSION: 1. There is been interval enlargement of segment 6 liver metastases.  Additionally mesenteric tumor at the enterocolonic anastomosis is mildly increased in size from previous exam. Peritoneal deposit along at the umbilicus is mildly increased in size in the interval. New ascites. Findings are concerning for mild progression of disease within the abdomen and pelvis. 2. No significant change in diffuse pulmonary metastases. Stable volume of right pleural effusion. 3. Previous index mesenteric lymph nodes are mildly increased in size in the interval. 4. New small pericardial effusion. Electronically Signed   By: Kerby Moors M.D.   On: 11/13/2018 13:27   Ct Angio Chest Pe W Or Wo Contrast  Result Date: 11/24/2018 CLINICAL DATA:  History of metastatic colon carcinoma with fever and abdominal pain EXAM: CT ANGIOGRAPHY CHEST CT ABDOMEN AND PELVIS WITH CONTRAST TECHNIQUE: Multidetector CT imaging of the chest was performed using the standard protocol during bolus administration of intravenous contrast. Multiplanar CT image reconstructions and MIPs were obtained to evaluate the vascular anatomy. Multidetector CT imaging of the abdomen and pelvis was performed using the standard protocol during bolus administration of intravenous contrast. CONTRAST:  91m OMNIPAQUE IOHEXOL 350 MG/ML SOLN COMPARISON:  Similar exam dated 11/13/2018 FINDINGS: CTA CHEST FINDINGS Cardiovascular: Thoracic aorta demonstrates a normal branching pattern. No aneurysmal dilatation or dissection is seen. The heart is normal in size and configuration. The pulmonary artery shows a normal branching pattern. No definitive filling defect to suggest pulmonary embolism is noted. Mediastinum/Nodes: Thoracic inlet is within normal limits. The esophagus is within normal limits. Scattered mediastinal lymph nodes are again identified similar to that seen on the recent restaging examination from 11 days previous. Stable right hilar lymph node is noted as well. Lungs/Pleura: The lungs are well aerated bilaterally. Nodules are  again identified bilaterally. The overall appearance is stable from the prior exam from 11 days previous. An enlarging right-sided pleural effusion is seen. New superimposed right lower lobe and right middle lobe infiltrate is seen. Musculoskeletal: Degenerative changes of the thoracic spine are noted. No metastatic lesions are seen. Left-sided chest wall port is noted Review of the MIP images confirms the above findings. CT ABDOMEN and PELVIS FINDINGS Hepatobiliary: Liver again demonstrates a hypodense lesion within the right lobe consistent with metastatic disease measuring 4.2 cm in greatest dimension. Small hypodensity in the dome of the liver is noted also stable from the prior study. Gallbladder demonstrates vicarious excretion of contrast. No ductal dilatation is seen. Pancreas: Unremarkable. No pancreatic ductal dilatation or surrounding inflammatory changes. Spleen: Normal in size without focal abnormality. Adrenals/Urinary Tract: Adrenal glands are within normal limits. Kidneys demonstrate no renal calculi or obstructive changes bilaterally. Normal excretion of contrast is seen. The bladder is decompressed. Stomach/Bowel: Colon is well visualized. Postsurgical changes are noted in the mid transverse colon consistent with the prior surgical history. There is again noted soft tissue mass in the mesentery adjacent to the anastomotic site best seen in the coronal plane on image number 40 of series 4. Just proximal to the small bowel colon anastomotic site there is some mild dilatation of  the distal small bowel identified. Multiple edematous loops of small bowel are noted proximal to the anastomotic site. A mild partial small bowel obstruction at the anastomotic site cannot be totally excluded. Vascular/Lymphatic: No aneurysmal dilatation is seen. The encasement of the superior mesenteric artery and vein by the adjacent mesenteric mass is less well visualized on today's exam due to the timing of the contrast  bolus. There does remain however venous engorgement of the small bowel contributing to the degree of small bowel wall edema and likely ascites. Reproductive: Prostate is unremarkable. Other: Increase in ascites is noted both in the pelvis as well as surrounding the bowel loops and liver. Peritoneal implant is again noted near the umbilicus. Musculoskeletal: Degenerative changes of lumbar spine are seen. Review of the MIP images confirms the above findings. IMPRESSION: CTA of the chest: No evidence of pulmonary emboli are identified. Stable right hilar and mediastinal adenopathy is seen. Stable metastatic lesions within both lungs. Increasing right-sided pleural effusion with associated right lower and middle lobe infiltrates CT of the abdomen and pelvis: Postsurgical changes consistent with the given clinical history. There remains a mesenteric mass near the anastomotic site similar to that seen on the recent exam. Stable appearing hepatic metastatic disease is noted. Persistent venous engorgement is noted related to vascular encasement with new small bowel edematous changes identified. Some mild dilatation of the distal small bowel proximal to the anastomosis is seen. The possibility of mild narrowing at the anastomosis deserves consideration. This was not present on the most recent exam. This may be accentuated by the venous engorgement and small-bowel wall edema. Increasing ascites. There are changes consistent with peritoneal implantation stable from the prior exam. Electronically Signed   By: Inez Catalina M.D.   On: 11/24/2018 18:13   Ct Abdomen Pelvis W Contrast  Result Date: 11/24/2018 CLINICAL DATA:  History of metastatic colon carcinoma with fever and abdominal pain EXAM: CT ANGIOGRAPHY CHEST CT ABDOMEN AND PELVIS WITH CONTRAST TECHNIQUE: Multidetector CT imaging of the chest was performed using the standard protocol during bolus administration of intravenous contrast. Multiplanar CT image  reconstructions and MIPs were obtained to evaluate the vascular anatomy. Multidetector CT imaging of the abdomen and pelvis was performed using the standard protocol during bolus administration of intravenous contrast. CONTRAST:  5m OMNIPAQUE IOHEXOL 350 MG/ML SOLN COMPARISON:  Similar exam dated 11/13/2018 FINDINGS: CTA CHEST FINDINGS Cardiovascular: Thoracic aorta demonstrates a normal branching pattern. No aneurysmal dilatation or dissection is seen. The heart is normal in size and configuration. The pulmonary artery shows a normal branching pattern. No definitive filling defect to suggest pulmonary embolism is noted. Mediastinum/Nodes: Thoracic inlet is within normal limits. The esophagus is within normal limits. Scattered mediastinal lymph nodes are again identified similar to that seen on the recent restaging examination from 11 days previous. Stable right hilar lymph node is noted as well. Lungs/Pleura: The lungs are well aerated bilaterally. Nodules are again identified bilaterally. The overall appearance is stable from the prior exam from 11 days previous. An enlarging right-sided pleural effusion is seen. New superimposed right lower lobe and right middle lobe infiltrate is seen. Musculoskeletal: Degenerative changes of the thoracic spine are noted. No metastatic lesions are seen. Left-sided chest wall port is noted Review of the MIP images confirms the above findings. CT ABDOMEN and PELVIS FINDINGS Hepatobiliary: Liver again demonstrates a hypodense lesion within the right lobe consistent with metastatic disease measuring 4.2 cm in greatest dimension. Small hypodensity in the dome of the liver is noted  also stable from the prior study. Gallbladder demonstrates vicarious excretion of contrast. No ductal dilatation is seen. Pancreas: Unremarkable. No pancreatic ductal dilatation or surrounding inflammatory changes. Spleen: Normal in size without focal abnormality. Adrenals/Urinary Tract: Adrenal glands  are within normal limits. Kidneys demonstrate no renal calculi or obstructive changes bilaterally. Normal excretion of contrast is seen. The bladder is decompressed. Stomach/Bowel: Colon is well visualized. Postsurgical changes are noted in the mid transverse colon consistent with the prior surgical history. There is again noted soft tissue mass in the mesentery adjacent to the anastomotic site best seen in the coronal plane on image number 40 of series 4. Just proximal to the small bowel colon anastomotic site there is some mild dilatation of the distal small bowel identified. Multiple edematous loops of small bowel are noted proximal to the anastomotic site. A mild partial small bowel obstruction at the anastomotic site cannot be totally excluded. Vascular/Lymphatic: No aneurysmal dilatation is seen. The encasement of the superior mesenteric artery and vein by the adjacent mesenteric mass is less well visualized on today's exam due to the timing of the contrast bolus. There does remain however venous engorgement of the small bowel contributing to the degree of small bowel wall edema and likely ascites. Reproductive: Prostate is unremarkable. Other: Increase in ascites is noted both in the pelvis as well as surrounding the bowel loops and liver. Peritoneal implant is again noted near the umbilicus. Musculoskeletal: Degenerative changes of lumbar spine are seen. Review of the MIP images confirms the above findings. IMPRESSION: CTA of the chest: No evidence of pulmonary emboli are identified. Stable right hilar and mediastinal adenopathy is seen. Stable metastatic lesions within both lungs. Increasing right-sided pleural effusion with associated right lower and middle lobe infiltrates CT of the abdomen and pelvis: Postsurgical changes consistent with the given clinical history. There remains a mesenteric mass near the anastomotic site similar to that seen on the recent exam. Stable appearing hepatic metastatic  disease is noted. Persistent venous engorgement is noted related to vascular encasement with new small bowel edematous changes identified. Some mild dilatation of the distal small bowel proximal to the anastomosis is seen. The possibility of mild narrowing at the anastomosis deserves consideration. This was not present on the most recent exam. This may be accentuated by the venous engorgement and small-bowel wall edema. Increasing ascites. There are changes consistent with peritoneal implantation stable from the prior exam. Electronically Signed   By: Inez Catalina M.D.   On: 11/24/2018 18:13   Ct Abdomen Pelvis W Contrast  Result Date: 11/13/2018 CLINICAL DATA:  Restaging metastatic colon cancer EXAM: CT CHEST, ABDOMEN, AND PELVIS WITH CONTRAST TECHNIQUE: Multidetector CT imaging of the chest, abdomen and pelvis was performed following the standard protocol during bolus administration of intravenous contrast. CONTRAST:  129m OMNIPAQUE IOHEXOL 300 MG/ML  SOLN COMPARISON:  CT chest 09/02/2018 and CT AP 08/29/2018 FINDINGS: CT CHEST FINDINGS Cardiovascular: The heart size appears normal. Small pericardial effusion, new. Lad coronary artery calcifications. Mediastinum/Nodes: Normal appearance of the thyroid gland. The trachea appears patent and is midline. Normal appearance of the esophagus. -index right paratracheal lymph node measures 1.2 cm, image 14/2. Previously 1.0 cm. Index right paratracheal lymph node measures 1 cm, image 16/2. Previously 0.9 cm. Index right pre-vascular node measures 0.9 cm, image 23/2. Previously 0.7 cm. Right hilar node measures 2 cm, image 33/2.  Previously 1.3 cm. Lungs/Pleura: Similar small to moderate right pleural effusion. Index posteromedial right lower lobe lung nodule measures 1 cm, image  80/6. Previously 0.9 cm. Index superior segment of left lower lobe lung nodule measures 1.9 cm, image 44/6. Unchanged. Index left upper lobe lung nodule measures 0.6 cm, image 69/6. Previously  0.5 cm. Index right upper lobe lung nodule measures 0.6 cm, image 44/6. Previously 0.7 cm. Index right middle lobe perifissural nodule measures 1 cm, image 91/6. Previously 0.8 cm. Index perifissural right lower lobe lung nodule measures 0.8 cm, image 112/6. Previously 1 cm. Musculoskeletal: No chest wall mass or suspicious bone lesions identified. CT ABDOMEN PELVIS FINDINGS Hepatobiliary: Index lesion within segment 6 measures 4.2 cm, image 72/2. Previously 3.2 cm. New, tiny nonspecific hypodensity along the dome measures 7 mm. Gallbladder normal. No biliary dilatation. Pancreas: Unremarkable. No pancreatic ductal dilatation or surrounding inflammatory changes. Spleen: The spleen is enlarged measuring 15.3 cm in cranial caudal dimension, unchanged. No focal splenic lesion. Adrenals/Urinary Tract: Normal adrenal glands. No kidney mass or hydronephrosis. Urinary bladder normal. Stomach/Bowel: The stomach is nondistended. Status post right hemicolectomy with entero colonic anastomosis. 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 Previously 4.0 x 4.8 cm. No evidence for high-grade bowel obstruction. Vascular/Lymphatic: Normal appearance of the abdominal aorta. Tumor encasement of the ileocolic branches of the superior mesenteric artery and vein identified. There is venous congestion identified within the associated ileocolic mesentery. Portal vein remains patent. Reproductive: Prostate is unremarkable. Other: New small volume ascites identified. Peritoneal implant at the level of the umbilicus measures 2.7 by 1.4 cm, image 84/2. Previously 2.0 x 1.3 cm. Musculoskeletal: No acute or significant osseous findings. IMPRESSION: 1. There is been interval enlargement of segment 6 liver metastases. Additionally mesenteric tumor at the enterocolonic anastomosis is mildly increased in size from previous exam. Peritoneal deposit along at the umbilicus is mildly increased in size in the interval.  New ascites. Findings are concerning for mild progression of disease within the abdomen and pelvis. 2. No significant change in diffuse pulmonary metastases. Stable volume of right pleural effusion. 3. Previous index mesenteric lymph nodes are mildly increased in size in the interval. 4. New small pericardial effusion. Electronically Signed   By: Kerby Moors M.D.   On: 11/13/2018 13:27   Dg Chest Port 1 View  Result Date: 11/24/2018 CLINICAL DATA:  Fever, cough, shortness of breath, nausea. EXAM: PORTABLE CHEST 1 VIEW COMPARISON:  November 05, 2018 FINDINGS: Left subclavian approach injectable port in stable position. Cardiomediastinal silhouette is mildly enlarged. Mediastinal contours appear intact. The left lung is clear. Right cardiophrenic airspace opacity and probable right pleural effusion. Previously noted by CT small pulmonary nodules are not seen radiographically. Osseous structures are without acute abnormality. Soft tissues are grossly normal. IMPRESSION: Right cardiophrenic airspace opacity and probable right pleural effusion. Electronically Signed   By: Fidela Salisbury M.D.   On: 11/24/2018 12:19    ASSESSMENT AND PLAN: 1.  Metastatic colorectal cancer 2.  Pneumonia 3.  Sepsis secondary to #2, resolved 4.  Acute kidney injury 5.  Right pleural effusion 6.  Anemia secondary to iron deficiency, recent chemotherapy, and underlying malignancy 7.  Depression and anxiety 8.  Chronic pain 9.  Goals of care discussion  -Recently completed regorafenib.  He had disease progression and will be starting Lonsurf in the near future. -Continue antibiotics per hospitalist.  Follow cultures. -Continue IV fluids for AKI -We will ask interventional radiology to perform a right thoracentesis. -He has had a continued decline in his hemoglobin.  We will check stool for occult blood.  Transfuse packed red  blood cells for hemoglobin less than 7.0 or active bleeding. -Continue venlafaxine and  Lorazepam -Continue fentanyl patch and oxycodone -Per previous discussions, the patient understands the goal of care is palliative and his overall prognosis is poor.  He has expressed that he wants to be a DNR in previous discussions.   LOS: 1 day   Mikey Bussing, DNP, AGPCNP-BC, AOCNP 11/25/18  Addendum  I have seen the patient, examined him. I agree with the assessment and and plan and have edited the notes.   Pt is very emotional, scared and stressed when I saw him, he has long history of depression and anxiety. I requested chaplain service for spiritual support, will ask our [place] to give him a call also. He unfortunately has terminal metastatic colon cancer, has progressed through multiple line therapy. I recently switched him to oral chemo Lonsurf which will be shipped to his home this week. I told him not to start until his pneumonia is adequately treated. I also recommend a thoracentesis to rule out infection and malignant pleural effusion.   Pt previously agreed with DNR, but changed to full code during this admission, he is not ready to die, partially related his depression and anxiety. Emotional support given. I will follow up when he is in the hospital.  Truitt Merle  11/25/2018

## 2018-11-26 DIAGNOSIS — J90 Pleural effusion, not elsewhere classified: Secondary | ICD-10-CM

## 2018-11-26 LAB — CBC
HCT: 26.6 % — ABNORMAL LOW (ref 39.0–52.0)
Hemoglobin: 8 g/dL — ABNORMAL LOW (ref 13.0–17.0)
MCH: 29 pg (ref 26.0–34.0)
MCHC: 30.1 g/dL (ref 30.0–36.0)
MCV: 96.4 fL (ref 80.0–100.0)
Platelets: 139 10*3/uL — ABNORMAL LOW (ref 150–400)
RBC: 2.76 MIL/uL — ABNORMAL LOW (ref 4.22–5.81)
RDW: 15.9 % — ABNORMAL HIGH (ref 11.5–15.5)
WBC: 3.5 10*3/uL — ABNORMAL LOW (ref 4.0–10.5)
nRBC: 0 % (ref 0.0–0.2)

## 2018-11-26 LAB — COMPREHENSIVE METABOLIC PANEL
ALT: 20 U/L (ref 0–44)
AST: 24 U/L (ref 15–41)
Albumin: 2.5 g/dL — ABNORMAL LOW (ref 3.5–5.0)
Alkaline Phosphatase: 56 U/L (ref 38–126)
Anion gap: 8 (ref 5–15)
BUN: 23 mg/dL — ABNORMAL HIGH (ref 6–20)
CO2: 18 mmol/L — ABNORMAL LOW (ref 22–32)
Calcium: 7.7 mg/dL — ABNORMAL LOW (ref 8.9–10.3)
Chloride: 109 mmol/L (ref 98–111)
Creatinine, Ser: 1.35 mg/dL — ABNORMAL HIGH (ref 0.61–1.24)
GFR calc Af Amer: >60 mL/min (ref >60)
GFR calc non Af Amer: 59 mL/min — ABNORMAL LOW (ref >60)
Glucose, Bld: 81 mg/dL (ref 70–99)
Potassium: 4.2 mmol/L (ref 3.5–5.1)
Sodium: 135 mmol/L (ref 135–145)
Total Bilirubin: 0.2 mg/dL — ABNORMAL LOW (ref 0.3–1.2)
Total Protein: 5.1 g/dL — ABNORMAL LOW (ref 6.5–8.1)

## 2018-11-26 MED ORDER — SODIUM CHLORIDE 0.9 % IV SOLN: INTRAVENOUS | Status: DC

## 2018-11-26 MED ORDER — PANTOPRAZOLE SODIUM 40 MG PO TBEC
40.0000 mg | DELAYED_RELEASE_TABLET | Freq: Two times a day (BID) | ORAL | Status: DC
  Administered 2018-11-26: 40 mg via ORAL
  Filled 2018-11-26: qty 1

## 2018-11-26 MED ORDER — SODIUM CHLORIDE 0.9 % IV SOLN
2.0000 g | Freq: Three times a day (TID) | INTRAVENOUS | Status: DC
  Administered 2018-11-26 – 2018-11-27 (×3): 2 g via INTRAVENOUS
  Filled 2018-11-26 (×4): qty 2

## 2018-11-26 NOTE — Telephone Encounter (Addendum)
Oral Oncology Patient Advocate Encounter  I called [facility] to see if the [place] had been scheduled to ship.  The representative stated that they have attempted to reach the patient and left voicemails. The patient has not reached out to them yet.  I called the patient and followed up with him. He has the phone number to call [facility] and will do that now that things have calmed down a little after being admitted into the hospital.  Oral Oncology clinic will continue to follow.  [place] Patient [place] Phone [phone] Fax [phone] 11/26/2018   9:57 AM

## 2018-11-26 NOTE — Consult Note (Signed)
[facility] Consult  Referring Provider:Johnson, Eldridge Dace, MD Primary Care Physician:  Curlene Labrum, MD Primary Gastroenterologist: Althia Forts  Reason for Consultation: Black stool, FOBT positive, anemia  HPI: Nicholas Lowery is a 58 y.o. male with history of metastatic colon cancer, had a colonoscopy in 2016, found to have metastases in 2017, underwent right hemicolectomy and small bowel resection and liver biopsy on 1/18, was started on chemotherapy from 3/18 presented to the ER with a fever of 103 F at home, fever, cough and shortness of breath concerning for COVID infection, but tested negative on 11/19/2018 and 11/24/2018, underwent right-sided 1.3 L thoracocentesis on 11/25/2018, cytology pending. While in the ER he had one episode of black stools, and another episode yesterday, stool tested positive for FOBT. He is on narcotics at home but denies use of NSAIDs. Denies abdominal pain, nausea, vomiting, difficulty swallowing or pain on swallowing. No prior endoscopy.   Past Medical History:  Diagnosis Date  . Anxiety   . Cancer of ascending colon ([place])   . Hypertension   . Seasonal allergies     Past Surgical History:  Procedure Laterality Date  . APPENDECTOMY  1992  . CATARACT EXTRACTION W/ INTRAOCULAR LENS IMPLANT Left 04/2016  . COLON SURGERY    . COLONOSCOPY W/ BIOPSIES AND POLYPECTOMY  04/2015  . INGUINAL HERNIA REPAIR Right 1982  . IR RADIOLOGIST EVAL & MGMT  01/16/2017  . KNEE ARTHROSCOPY W/ MENISCECTOMY Right 1998  . LAPAROSCOPIC RIGHT COLECTOMY N/A 06/14/2016   Procedure: LAPAROSCOPIC HAND ASSISTED HEMICOLECTOMY AND SMALL BOWEL RESECTION.;  Surgeon: [name], MD;  Location: [facility];  Service: General;  Laterality: N/A;  . LIVER BIOPSY Right 06/14/2016   Procedure: LIVER BIOPSY;  Surgeon: [name], MD;  Location: [facility];  Service: General;  Laterality: Right;  Right Inferior Liver  . PORTACATH PLACEMENT N/A 07/06/2016   Procedure: INSERTION  PORT-A-CATH;  Surgeon: [name], MD;  Location: [facility];  Service: General;  Laterality: N/A;  . VASECTOMY  2005    Prior to Admission medications   Medication Sig Start Date End Date Taking? Authorizing Provider  albuterol (VENTOLIN HFA) 108 (90 Base) MCG/ACT inhaler Inhale 1-2 puffs into the lungs every 6 (six) hours as needed for wheezing or shortness of breath. 09/11/18  Yes Alla Feeling, NP  amLODipine (NORVASC) 10 MG tablet Take 1 tablet (10 mg total) by mouth daily. 11/18/18  Yes Truitt Merle, MD  celecoxib (CELEBREX) 200 MG capsule Take 200 mg by mouth daily. 11/18/18  Yes [provider]  cyclobenzaprine (FLEXERIL) 5 MG tablet Take 1 tablet (5 mg total) by mouth 3 (three) times daily as needed for muscle spasms. 11/19/18  Yes Tanner, Lyndon Code., PA-C  fentaNYL (DURAGESIC) 50 MCG/HR Place 1 patch onto the skin every other day. 11/14/18  Yes Truitt Merle, MD  fluticasone (FLOVENT HFA) 110 MCG/ACT inhaler Inhale 2 puffs into the lungs 2 (two) times daily. 11/19/18  Yes Tanner, Lyndon Code., PA-C  gabapentin (NEURONTIN) 300 MG capsule TAKE ONE CAPSULE BY MOUTH THREE TIMES DAILY Patient taking differently: Take 300 mg by mouth 3 (three) times daily.  10/23/18  Yes Truitt Merle, MD  LORazepam (ATIVAN) 1 MG tablet Take 1 tablet (1 mg total) by mouth every 4 (four) hours as needed for anxiety or sleep (nausea). 09/03/18  Yes Smith, Rondell A, MD  losartan (COZAAR) 100 MG tablet TAKE ONE TABLET BY MOUTH EVERY DAY Patient taking differently: Take 100 mg by mouth daily.  11/18/18  Yes Truitt Merle, MD  Multiple Vitamin (MULTIVITAMIN WITH MINERALS) TABS tablet Take 1 tablet by mouth daily.   Yes [provider]  omeprazole (PRILOSEC) 20 MG capsule TAKE ONE CAPSULE BY MOUTH TWICE DAILY BEFORE A MEAL Patient taking differently: Take 20 mg by mouth daily.  08/14/18  Yes Truitt Merle, MD  Oxycodone HCl 10 MG TABS Take 1-2 tablets (10-20 mg total) by mouth every 6 (six) hours as needed. Patient taking  differently: Take 10-20 mg by mouth every 6 (six) hours as needed (pain).  11/14/18  Yes Truitt Merle, MD  polyethylene glycol (MIRALAX / GLYCOLAX) 17 g packet Take 17 g by mouth daily. Patient taking differently: Take 17 g by mouth daily as needed for mild constipation.  08/29/18  Yes Domenic Moras, PA-C  prochlorperazine (COMPAZINE) 10 MG tablet Take 1 tablet (10 mg total) by mouth every 6 (six) hours as needed for nausea or vomiting. 06/16/17  Yes Truitt Merle, MD  senna-docusate (SENOKOT-S) 8.6-50 MG tablet Take 2 tablets by mouth 2 (two) times daily. Patient taking differently: Take 2 tablets by mouth as needed for mild constipation.  09/03/18  Yes Smith, Eustaquio Boyden A, MD  sildenafil (REVATIO) 20 MG tablet Take 60-100 mg by mouth See admin instructions. Take 60-100 mg by mouth at least 30 minutes prior to sexual activity 01/19/18  Yes [provider]  sulfamethoxazole-trimethoprim (BACTRIM DS) 800-160 MG tablet Take 1 tablet by mouth 2 (two) times daily. 11/19/18  Yes Tanner, Lyndon Code., PA-C  venlafaxine XR (EFFEXOR-XR) 150 MG 24 hr capsule TAKE ONE CAPSULE BY MOUTH DAILY WITH BREAKFAST Patient taking differently: Take 150 mg by mouth daily with breakfast.  11/18/18  Yes Truitt Merle, MD  cyclobenzaprine (FLEXERIL) 5 MG tablet Take 1-2 tablets (5-10 mg total) by mouth 3 (three) times daily as needed for muscle spasms. Patient not taking: Reported on 11/24/2018 11/19/18   Alla Feeling, NP  dronabinol (MARINOL) 2.5 MG capsule Take 1 capsule (2.5 mg total) by mouth 2 (two) times daily before a meal. Patient not taking: Reported on 11/24/2018 10/14/18   Truitt Merle, MD  fentaNYL (DURAGESIC) 25 MCG/HR Place 1 patch onto the skin every other day. Patient not taking: Reported on 11/24/2018 10/18/18   Truitt Merle, MD  simethicone (MYLICON) 80 MG chewable tablet Chew 1 tablet (80 mg total) by mouth 4 (four) times daily as needed for flatulence. Patient not taking: Reported on 11/24/2018 09/03/18   Norval Morton, MD   trifluridine-tipiracil (LONSURF) 20-8.19 MG tablet Take 4 tabs (80mg  trifluridine) PO in AM & 3 tabs (60mg  trifluridine) in PM, immediately after food on days 1-5 & 8-12 of each 28d cycle 11/18/18   Truitt Merle, MD    Current Facility-Administered Medications  Medication Dose Route Frequency Provider Last Rate Last Dose  . 0.9 %  sodium chloride infusion   Intravenous Continuous Johnson, Clanford L, MD 75 mL/hr at 11/26/18 0257    . acetaminophen (TYLENOL) tablet 650 mg  650 mg Oral Q6H PRN Alma Friendly, MD       Or  . acetaminophen (TYLENOL) suppository 650 mg  650 mg Rectal Q6H PRN Alma Friendly, MD      . albuterol (PROVENTIL) (2.5 MG/3ML) 0.083% nebulizer solution 2.5 mg  2.5 mg Nebulization Q6H PRN Johnson, Clanford L, MD      . budesonide (PULMICORT) nebulizer solution 0.5 mg  0.5 mg Nebulization BID Reuel Boom A, RPH   0.5 mg at 11/26/18 1133  . ceFEPIme (MAXIPIME)  2 g in sodium chloride 0.9 % 100 mL IVPB  2 g Intravenous Q8H BellRonaldo Miyamoto, RPH      . Chlorhexidine Gluconate Cloth 2 % PADS 6 each  6 each Topical Daily Alma Friendly, MD   6 each at 11/26/18 [phone]  . cyclobenzaprine (FLEXERIL) tablet 5 mg  5 mg Oral TID PRN Alma Friendly, MD      . feeding supplement (ENSURE ENLIVE) (ENSURE ENLIVE) liquid 237 mL  237 mL Oral BID BM Johnson, Clanford L, MD   237 mL at 11/26/18 0926  . fentaNYL (DURAGESIC) 50 MCG/HR 1 patch  1 patch Transdermal Q72H Alma Friendly, MD   1 patch at 11/24/18 2008  . gabapentin (NEURONTIN) capsule 200 mg  200 mg Oral TID Wynetta Emery, Clanford L, MD   200 mg at 11/26/18 0926  . LORazepam (ATIVAN) tablet 1 mg  1 mg Oral Q4H PRN Alma Friendly, MD   1 mg at 11/25/18 2136  . multivitamin with minerals tablet 1 tablet  1 tablet Oral Daily Wynetta Emery, Clanford L, MD   1 tablet at 11/26/18 0926  . ondansetron (ZOFRAN) tablet 4 mg  4 mg Oral Q6H PRN Alma Friendly, MD   4 mg at 11/25/18 2142   Or  . ondansetron (ZOFRAN)  injection 4 mg  4 mg Intravenous Q6H PRN Alma Friendly, MD      . oxyCODONE (Oxy IR/ROXICODONE) immediate release tablet 10-20 mg  10-20 mg Oral Q6H PRN Alma Friendly, MD   20 mg at 11/26/18 0817  . pantoprazole (PROTONIX) EC tablet 40 mg  40 mg Oral BID Ronnette Juniper, MD      . polyethylene glycol (MIRALAX / GLYCOLAX) packet 17 g  17 g Oral BID Alma Friendly, MD   Stopped at 11/25/18 [phone]  . prochlorperazine (COMPAZINE) tablet 10 mg  10 mg Oral Q6H PRN Alma Friendly, MD      . senna-docusate (Senokot-S) tablet 1 tablet  1 tablet Oral BID Alma Friendly, MD   1 tablet at 11/26/18 0926  . sodium chloride flush (NS) 0.9 % injection 10-40 mL  10-40 mL Intracatheter PRN Johnson, Clanford L, MD      . venlafaxine XR (EFFEXOR-XR) 24 hr capsule 150 mg  150 mg Oral Q breakfast Alma Friendly, MD   150 mg at 11/26/18 0817    Allergies as of 11/24/2018 - Review Complete 11/24/2018  Allergen Reaction Noted  . Penicillins Other (See Comments) 06/06/2016  . Xeloda [capecitabine] Nausea And Vomiting and Other (See Comments) 03/15/2017  . Nivolumab Nausea And Vomiting, Palpitations, and Other (See Comments) 09/23/2018    Family History  Problem Relation Age of Onset  . Cancer Mother        lung cancer  . Stroke Mother   . Hypertension Father   . CAD Father   . Cancer Maternal Grandfather        prostate cancer     Social History   Socioeconomic History  . Marital status: Married    Spouse name: Not on file  . Number of children: Not on file  . Years of education: Not on file  . Highest education level: Not on file  Occupational History  . Not on file  Social Needs  . Financial resource strain: Not on file  . Food insecurity    Worry: Not on file    Inability: Not on file  . Transportation needs    Medical:  Not on file    Non-medical: Not on file  Tobacco Use  . Smoking status: Former Smoker    Years: 2.00    Types: Cigarettes    Quit date: 1990     Years since quitting: 30.5  . Smokeless tobacco: Never Used  Substance and Sexual Activity  . Alcohol use: Yes    Alcohol/week: 20.0 standard drinks    Types: 20 Cans of beer per week    Comment: pt states he drinks about 3-4 beers per night  . Drug use: No    Comment: 06/15/2016 "nothing since college"  . Sexual activity: Yes  Lifestyle  . Physical activity    Days per week: Not on file    Minutes per session: Not on file  . Stress: Not on file  Relationships  . Social Herbalist on phone: Not on file    Gets together: Not on file    Attends religious service: Not on file    Active member of club or organization: Not on file    Attends meetings of clubs or organizations: Not on file    Relationship status: Not on file  . Intimate partner violence    Fear of current or ex partner: Not on file    Emotionally abused: Not on file    Physically abused: Not on file    Forced sexual activity: Not on file  Other Topics Concern  . Not on file  Social History Narrative  . Not on file    Review of Systems: Positive for: GI: Described in detail in HPI.    Gen: fever, chills, rigors, fatigue, weakness, malaise, involuntary weight loss,  Denies any night sweats, anorexia, and sleep disorder CV: Denies chest pain, angina, palpitations, syncope, orthopnea, PND, peripheral edema, and claudication. Resp: dyspnea, cough, sputum, wheezing. GU : Denies urinary burning, blood in urine, urinary frequency, urinary hesitancy, nocturnal urination, and urinary incontinence. MS: Denies joint pain or swelling.  Denies muscle weakness, cramps, atrophy.  Derm: Denies rash, itching, oral ulcerations, hives, unhealing ulcers.  Psych: Denies depression, anxiety, memory loss, suicidal ideation, hallucinations,  and confusion. Heme: Denies bruising and enlarged lymph nodes. Neuro:  Denies any headaches, dizziness, paresthesias. Endo:  Denies any problems with DM, thyroid, adrenal  function.  Physical Exam: Vital signs in last 24 hours: Temp:  [97.6 F (36.4 C)-98.1 F (36.7 C)] 98.1 F (36.7 C) (07/14 0436) Pulse Rate:  [84-102] 92 (07/14 0436) Resp:  [11-22] 17 (07/14 0436) BP: (111-129)/(72-89) 111/88 (07/14 0436) SpO2:  [93 %-98 %] 93 % (07/14 1133) Last BM Date: 11/25/18  General:   Alert,  Well-developed, well-nourished, pleasant and cooperative in NAD Head:  Normocephalic and atraumatic. Eyes: Mild pallor Ears:  Normal auditory acuity. Nose:  No deformity, discharge,  or lesions. Mouth:  No deformity or lesions.  Oropharynx pink & moist. Neck:  Supple; no masses or thyromegaly. Lungs: Audible mild wheezing decreased breath sound in right lower lung field  Heart:  Regular rate and rhythm; no murmurs, clicks, rubs,  or gallops. Extremities:  Without clubbing or edema. Neurologic:  Alert and  oriented x4;  grossly normal neurologically. Skin:  Intact without significant lesions or rashes. Psych:  Alert and cooperative. Normal mood and affect. Abdomen: Surgical incision appears healthy, Normal bowel sounds, without guarding, and without rebound.         Lab Results: Recent Labs    11/24/18 1200 11/25/18 0248 11/26/18 0433  WBC 4.3 2.9* 3.5*  HGB  8.9* 8.4* 8.0*  HCT 29.0* 27.6* 26.6*  PLT 166 160 139*   BMET Recent Labs    11/24/18 1728 11/25/18 0248 11/26/18 0433  NA 133* 135 135  K 4.5 4.1 4.2  CL 107 109 109  CO2 19* 18* 18*  GLUCOSE 109* 96 81  BUN 32* 32* 23*  CREATININE 1.79* 1.71* 1.35*  CALCIUM 7.7* 7.6* 7.7*   LFT Recent Labs    11/26/18 0433  PROT 5.1*  ALBUMIN 2.5*  AST 24  ALT 20  ALKPHOS 56  BILITOT 0.2*   PT/INR Recent Labs    11/24/18 1200  LABPROT 16.6*  INR 1.4*    Studies/Results: Dg Chest 1 View  Result Date: 11/25/2018 CLINICAL DATA:  Status post RIGHT thoracentesis. EXAM: CHEST  1 VIEW COMPARISON:  None. FINDINGS: The RIGHT pleural effusion has decreased in size. Mild RIGHT basilar  atelectasis persists. There is no evidence of pneumothorax. A LEFT Port-A-Cath is again noted. The LEFT lung is clear. IMPRESSION: Decreased RIGHT pleural effusion with mild persistent RIGHT basilar atelectasis. No pneumothorax. Electronically Signed   By: Margarette Canada M.D.   On: 11/25/2018 14:07   Ct Angio Chest Pe W Or Wo Contrast  Result Date: 11/24/2018 CLINICAL DATA:  History of metastatic colon carcinoma with fever and abdominal pain EXAM: CT ANGIOGRAPHY CHEST CT ABDOMEN AND PELVIS WITH CONTRAST TECHNIQUE: Multidetector CT imaging of the chest was performed using the standard protocol during bolus administration of intravenous contrast. Multiplanar CT image reconstructions and MIPs were obtained to evaluate the vascular anatomy. Multidetector CT imaging of the abdomen and pelvis was performed using the standard protocol during bolus administration of intravenous contrast. CONTRAST:  63mL OMNIPAQUE IOHEXOL 350 MG/ML SOLN COMPARISON:  Similar exam dated 11/13/2018 FINDINGS: CTA CHEST FINDINGS Cardiovascular: Thoracic aorta demonstrates a normal branching pattern. No aneurysmal dilatation or dissection is seen. The heart is normal in size and configuration. The pulmonary artery shows a normal branching pattern. No definitive filling defect to suggest pulmonary embolism is noted. Mediastinum/Nodes: Thoracic inlet is within normal limits. The esophagus is within normal limits. Scattered mediastinal lymph nodes are again identified similar to that seen on the recent restaging examination from 11 days previous. Stable right hilar lymph node is noted as well. Lungs/Pleura: The lungs are well aerated bilaterally. Nodules are again identified bilaterally. The overall appearance is stable from the prior exam from 11 days previous. An enlarging right-sided pleural effusion is seen. New superimposed right lower lobe and right middle lobe infiltrate is seen. Musculoskeletal: Degenerative changes of the thoracic spine are  noted. No metastatic lesions are seen. Left-sided chest wall port is noted Review of the MIP images confirms the above findings. CT ABDOMEN and PELVIS FINDINGS Hepatobiliary: Liver again demonstrates a hypodense lesion within the right lobe consistent with metastatic disease measuring 4.2 cm in greatest dimension. Small hypodensity in the dome of the liver is noted also stable from the prior study. Gallbladder demonstrates vicarious excretion of contrast. No ductal dilatation is seen. Pancreas: Unremarkable. No pancreatic ductal dilatation or surrounding inflammatory changes. Spleen: Normal in size without focal abnormality. Adrenals/Urinary Tract: Adrenal glands are within normal limits. Kidneys demonstrate no renal calculi or obstructive changes bilaterally. Normal excretion of contrast is seen. The bladder is decompressed. Stomach/Bowel: Colon is well visualized. Postsurgical changes are noted in the mid transverse colon consistent with the prior surgical history. There is again noted soft tissue mass in the mesentery adjacent to the anastomotic site best seen in the coronal plane on  image number 40 of series 4. Just proximal to the small bowel colon anastomotic site there is some mild dilatation of the distal small bowel identified. Multiple edematous loops of small bowel are noted proximal to the anastomotic site. A mild partial small bowel obstruction at the anastomotic site cannot be totally excluded. Vascular/Lymphatic: No aneurysmal dilatation is seen. The encasement of the superior mesenteric artery and vein by the adjacent mesenteric mass is less well visualized on today's exam due to the timing of the contrast bolus. There does remain however venous engorgement of the small bowel contributing to the degree of small bowel wall edema and likely ascites. Reproductive: Prostate is unremarkable. Other: Increase in ascites is noted both in the pelvis as well as surrounding the bowel loops and liver. Peritoneal  implant is again noted near the umbilicus. Musculoskeletal: Degenerative changes of lumbar spine are seen. Review of the MIP images confirms the above findings. IMPRESSION: CTA of the chest: No evidence of pulmonary emboli are identified. Stable right hilar and mediastinal adenopathy is seen. Stable metastatic lesions within both lungs. Increasing right-sided pleural effusion with associated right lower and middle lobe infiltrates CT of the abdomen and pelvis: Postsurgical changes consistent with the given clinical history. There remains a mesenteric mass near the anastomotic site similar to that seen on the recent exam. Stable appearing hepatic metastatic disease is noted. Persistent venous engorgement is noted related to vascular encasement with new small bowel edematous changes identified. Some mild dilatation of the distal small bowel proximal to the anastomosis is seen. The possibility of mild narrowing at the anastomosis deserves consideration. This was not present on the most recent exam. This may be accentuated by the venous engorgement and small-bowel wall edema. Increasing ascites. There are changes consistent with peritoneal implantation stable from the prior exam. Electronically Signed   By: Inez Catalina M.D.   On: 11/24/2018 18:13   Ct Abdomen Pelvis W Contrast  Result Date: 11/24/2018 CLINICAL DATA:  History of metastatic colon carcinoma with fever and abdominal pain EXAM: CT ANGIOGRAPHY CHEST CT ABDOMEN AND PELVIS WITH CONTRAST TECHNIQUE: Multidetector CT imaging of the chest was performed using the standard protocol during bolus administration of intravenous contrast. Multiplanar CT image reconstructions and MIPs were obtained to evaluate the vascular anatomy. Multidetector CT imaging of the abdomen and pelvis was performed using the standard protocol during bolus administration of intravenous contrast. CONTRAST:  24mL OMNIPAQUE IOHEXOL 350 MG/ML SOLN COMPARISON:  Similar exam dated 11/13/2018  FINDINGS: CTA CHEST FINDINGS Cardiovascular: Thoracic aorta demonstrates a normal branching pattern. No aneurysmal dilatation or dissection is seen. The heart is normal in size and configuration. The pulmonary artery shows a normal branching pattern. No definitive filling defect to suggest pulmonary embolism is noted. Mediastinum/Nodes: Thoracic inlet is within normal limits. The esophagus is within normal limits. Scattered mediastinal lymph nodes are again identified similar to that seen on the recent restaging examination from 11 days previous. Stable right hilar lymph node is noted as well. Lungs/Pleura: The lungs are well aerated bilaterally. Nodules are again identified bilaterally. The overall appearance is stable from the prior exam from 11 days previous. An enlarging right-sided pleural effusion is seen. New superimposed right lower lobe and right middle lobe infiltrate is seen. Musculoskeletal: Degenerative changes of the thoracic spine are noted. No metastatic lesions are seen. Left-sided chest wall port is noted Review of the MIP images confirms the above findings. CT ABDOMEN and PELVIS FINDINGS Hepatobiliary: Liver again demonstrates a hypodense lesion within the right  lobe consistent with metastatic disease measuring 4.2 cm in greatest dimension. Small hypodensity in the dome of the liver is noted also stable from the prior study. Gallbladder demonstrates vicarious excretion of contrast. No ductal dilatation is seen. Pancreas: Unremarkable. No pancreatic ductal dilatation or surrounding inflammatory changes. Spleen: Normal in size without focal abnormality. Adrenals/Urinary Tract: Adrenal glands are within normal limits. Kidneys demonstrate no renal calculi or obstructive changes bilaterally. Normal excretion of contrast is seen. The bladder is decompressed. Stomach/Bowel: Colon is well visualized. Postsurgical changes are noted in the mid transverse colon consistent with the prior surgical history.  There is again noted soft tissue mass in the mesentery adjacent to the anastomotic site best seen in the coronal plane on image number 40 of series 4. Just proximal to the small bowel colon anastomotic site there is some mild dilatation of the distal small bowel identified. Multiple edematous loops of small bowel are noted proximal to the anastomotic site. A mild partial small bowel obstruction at the anastomotic site cannot be totally excluded. Vascular/Lymphatic: No aneurysmal dilatation is seen. The encasement of the superior mesenteric artery and vein by the adjacent mesenteric mass is less well visualized on today's exam due to the timing of the contrast bolus. There does remain however venous engorgement of the small bowel contributing to the degree of small bowel wall edema and likely ascites. Reproductive: Prostate is unremarkable. Other: Increase in ascites is noted both in the pelvis as well as surrounding the bowel loops and liver. Peritoneal implant is again noted near the umbilicus. Musculoskeletal: Degenerative changes of lumbar spine are seen. Review of the MIP images confirms the above findings. IMPRESSION: CTA of the chest: No evidence of pulmonary emboli are identified. Stable right hilar and mediastinal adenopathy is seen. Stable metastatic lesions within both lungs. Increasing right-sided pleural effusion with associated right lower and middle lobe infiltrates CT of the abdomen and pelvis: Postsurgical changes consistent with the given clinical history. There remains a mesenteric mass near the anastomotic site similar to that seen on the recent exam. Stable appearing hepatic metastatic disease is noted. Persistent venous engorgement is noted related to vascular encasement with new small bowel edematous changes identified. Some mild dilatation of the distal small bowel proximal to the anastomosis is seen. The possibility of mild narrowing at the anastomosis deserves consideration. This was not  present on the most recent exam. This may be accentuated by the venous engorgement and small-bowel wall edema. Increasing ascites. There are changes consistent with peritoneal implantation stable from the prior exam. Electronically Signed   By: Inez Catalina M.D.   On: 11/24/2018 18:13   Dg Chest Port 1 View  Result Date: 11/24/2018 CLINICAL DATA:  Fever, cough, shortness of breath, nausea. EXAM: PORTABLE CHEST 1 VIEW COMPARISON:  November 05, 2018 FINDINGS: Left subclavian approach injectable port in stable position. Cardiomediastinal silhouette is mildly enlarged. Mediastinal contours appear intact. The left lung is clear. Right cardiophrenic airspace opacity and probable right pleural effusion. Previously noted by CT small pulmonary nodules are not seen radiographically. Osseous structures are without acute abnormality. Soft tissues are grossly normal. IMPRESSION: Right cardiophrenic airspace opacity and probable right pleural effusion. Electronically Signed   By: Fidela Salisbury M.D.   On: 11/24/2018 12:19   US Thoracentesis Asp Pleural Space W/img Guide  Result Date: 11/25/2018 INDICATION: Patient with history of shortness of breath, pneumonia, right pleural effusion. Request made for diagnostic and therapeutic thoracentesis. EXAM: ULTRASOUND GUIDED RIGHT DIAGNOSTIC AND THERAPEUTIC THORACENTESIS MEDICATIONS: 10 mL  1% lidocaine COMPLICATIONS: None immediate. PROCEDURE: An ultrasound guided thoracentesis was thoroughly discussed with the patient and questions answered. The benefits, risks, alternatives and complications were also discussed. The patient understands and wishes to proceed with the procedure. Written consent was obtained. Ultrasound was performed to localize and mark an adequate pocket of fluid in the right chest. The area was then prepped and draped in the normal sterile fashion. 1% Lidocaine was used for local anesthesia. Under ultrasound guidance a 6 Fr Safe-T-Centesis catheter was  introduced. Thoracentesis was performed. The catheter was removed and a dressing applied. FINDINGS: A total of approximately 1.3 liters of clear, yellow fluid was removed. Samples were sent to the laboratory as requested by the clinical team. IMPRESSION: Successful ultrasound guided diagnostic and therapeutic right thoracentesis yielding 1.3 liters of pleural fluid. Read by: Brynda Greathouse PA-C Electronically Signed   By: Markus Daft M.D.   On: 11/25/2018 14:33    Impression: 2 episodes of black stool, elevated BUN/creatinine ratio of 32/1.79 on admission, positive FOBT, possible upper GI bleed. Hemoglobin has remained stable at 8.9/8 0.4/8 and patient has not required PRBC transfusion, remains hemodynamically stable. On Protonix 40 mg daily.  Metastatic colon cancer, guarded prognosis but patient remains full code Right-sided thoracocentesis  Plan: Patient had regular food for breakfast today and has another tray for lunch at bedside. Keep patient n.p.o. post midnight, possible EGD in a.m., increase PPI to twice daily, monitor hemodynamics with plan to transfuse if hemoglobin is less than 7   LOS: 2 days   Gari Crown  11/26/2018, 11:59 AM  Pager [phone] If no answer or after 5 PM call [phone]

## 2018-11-26 NOTE — Progress Notes (Signed)
PHARMACY NOTE -  ANTIBIOTIC RENAL DOSE ADJUSTMENT   Patient has been initiated on cefepime for HCAP. SCr 1.35, estimated CrCl 70 ml/min  Plan: increase to cefepime 2 gm IV q8h  Eudelia Bunch, Pharm.D [phone] 11/26/2018 11:23 AM

## 2018-11-26 NOTE — Progress Notes (Signed)
PROGRESS NOTE    Nicholas Lowery  TIR:[id number]  DOB: [birth year]  DOA: 11/24/2018 PCP: Curlene Labrum, MD  Brief Admission Hx: 58 y/o male with metastatic colon cancer, hypertension, anxiety presented with shortness of breath and fever.  MDM/Assessment & Plan:   1. Nosocomial pneumonia-patient is responding to treatments.  Changed antibiotics to cefepime to cover strep, discontinued vancomycin as MRSA screening is negative.  Continue supportive therapy. 2. AKI - creatinine improving with IV fluid hydration.  Follow BMP.  3. Sepsis - RESOVED.  Secondary to HCAP - resolved with treatments.  4. 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  Following CBC closely.  Stool hemoccult positive. GI consulted.  EGD planned for 7/15.  5. Metastatic colon cancer to liver/lung -[name] consulting.  6. Chronic pain - resumed home medications.  7. GERD - protonix ordered for GI protection.  8. Depression/anxiety - continue venlafaxine/lorazepam.  DVT prophylaxis: heparin  Code Status: full  Family Communication: wife telephone update Disposition Plan: inpatient for EGD 7/15  Consultants:  His oncologist [name] has seen him this admission   Procedures:    Antimicrobials:  Vancomycin/aztreonam 7/12>7/13  Cefepime 7/13 >   Subjective: Pt having less SOB after thoracentesis, no CP, no fever.    Objective: Vitals:   11/25/18 2052 11/26/18 0436 11/26/18 1133 11/26/18 1343  BP: 129/89 111/88  117/88  Pulse: (!) 101 92  89  Resp: 20 17  18   Temp: 97.6 F (36.4 C) 98.1 F (36.7 C)  97.8 F (36.6 C)  TempSrc:  Oral  Oral  SpO2: 96% 93% 93% 95%  Weight:      Height:        Intake/Output Summary (Last 24 hours) at 11/26/2018 1457 Last data filed at 11/26/2018 1400 Gross per 24 hour  Intake 2222.84 ml  Output 900 ml  Net 1322.84 ml   Filed Weights   11/24/18 1121 11/24/18 1832  Weight: 80 kg 90.1 kg   REVIEW OF SYSTEMS  As per history otherwise all reviewed  and reported negative  Exam:  General exam: awake, alert, NAD, cooperative.   Respiratory system: bilateral expiratory wheezes heard.  No increased work of breathing. Cardiovascular system: S1 & S2 heard. No JVD, murmurs, gallops, clicks or pedal edema. Gastrointestinal system: Abdomen is nondistended, soft and nontender. Normal bowel sounds heard. Central nervous system: Alert and oriented. No focal neurological deficits. Extremities: no CCE.  Data Reviewed: Basic Metabolic Panel: Recent Labs  Lab 11/24/18 1200 11/24/18 1728 11/25/18 0248 11/26/18 0433  NA 134* 133* 135 135  K 4.8 4.5 4.1 4.2  CL 107 107 109 109  CO2 19* 19* 18* 18*  GLUCOSE 124* 109* 96 81  BUN 35* 32* 32* 23*  CREATININE 1.85* 1.79* 1.71* 1.35*  CALCIUM 8.3* 7.7* 7.6* 7.7*   Liver Function Tests: Recent Labs  Lab 11/24/18 1200 11/24/18 1728 11/26/18 0433  AST 27 28 24   ALT 22 22 20   ALKPHOS 70 67 56  BILITOT 0.6 0.5 0.2*  PROT 6.3* 5.8* 5.1*  ALBUMIN 3.2* 3.0* 2.5*   No results for input(s): LIPASE, AMYLASE in the last 168 hours. No results for input(s): AMMONIA in the last 168 hours. CBC: Recent Labs  Lab 11/24/18 1200 11/25/18 0248 11/26/18 0433  WBC 4.3 2.9* 3.5*  NEUTROABS 3.3  --   --   HGB 8.9* 8.4* 8.0*  HCT 29.0* 27.6* 26.6*  MCV 94.5 95.5 96.4  PLT 166 160 139*   Cardiac Enzymes: No results  for input(s): CKTOTAL, CKMB, CKMBINDEX, TROPONINI in the last 168 hours. CBG (last 3)  No results for input(s): GLUCAP in the last 72 hours. Recent Results (from the past 240 hour(s))  Novel Coronavirus, NAA ([facility])     Status: None   Collection Time: 11/19/18  8:24 AM  Result Value Ref Range Status   SARS-CoV-2, NAA Not Detected Not Detected Final    Comment: Testing was performed using the cobas(R) SARS-CoV-2 test. This test was developed and its performance characteristics determined by [employer]. This test has not been FDA cleared or approved. This test has been  authorized by FDA under an Emergency Use Authorization (EUA). This test is only authorized for the duration of time the declaration that circumstances exist justifying the authorization of the emergency use of in vitro diagnostic tests for detection of SARS-CoV-2 virus and/or diagnosis of COVID-19 infection under section 564(b)(1) of the Act, 21 U.S.C. 481EHU-3(J)(4), unless the authorization is terminated or revoked sooner. When diagnostic testing is negative, the possibility of a false negative result should be considered in the context of a patient's recent exposures and the presence of clinical signs and symptoms consistent with COVID-19. An individual without symptoms of COVID-19 and who is not shedding SARS-CoV-2 virus would expect to have a negati ve (not detected) result in this assay.   Blood Culture (routine x 2)     Status: None (Preliminary result)   Collection Time: 11/24/18 11:33 AM   Specimen: BLOOD  Result Value Ref Range Status   Specimen Description   Final    BLOOD SITE NOT SPECIFIED Performed at [facility], [address] [place], [place] [address]    Special Requests   Final    BOTTLES DRAWN AEROBIC AND ANAEROBIC Blood Culture adequate volume Performed at [facility] [address], [place], [place] [address]    Culture   Final    NO GROWTH 2 DAYS Performed at [facility] [address], [place], [place] [address]    Report Status PENDING  Incomplete  Blood Culture (routine x 2)     Status: None (Preliminary result)   Collection Time: 11/24/18 12:25 PM   Specimen: BLOOD  Result Value Ref Range Status   Specimen Description   Final    BLOOD PORT Performed at [facility] [address], [place], [place] [address]    Special Requests   Final    BOTTLES DRAWN AEROBIC ONLY Blood Culture results may not be optimal due to an excessive volume of blood received in culture bottles Performed at [facility] [address], [place], [place] [address]    Culture   Final    NO GROWTH 2 DAYS Performed at [facility] [address] [place],  [address]    Report Status PENDING  Incomplete  SARS Coronavirus 2 (CEPHEID- Performed in [facility] hospital lab), Hosp Order     Status: None   Collection Time: 11/24/18 12:53 PM   Specimen: Nasopharyngeal Swab  Result Value Ref Range Status   SARS Coronavirus 2 NEGATIVE NEGATIVE Final    Comment: (NOTE) If result is NEGATIVE SARS-CoV-2 target nucleic acids are NOT DETECTED. The SARS-CoV-2 RNA is generally detectable in upper and lower  respiratory specimens during the acute phase of infection. The lowest  concentration of SARS-CoV-2 viral copies this assay can detect is 250  copies / mL. A negative result does not preclude SARS-CoV-2 infection  and  should not be used as the sole basis for treatment or other  patient management decisions.  A negative result may occur with  improper specimen collection / handling, submission of specimen other  than nasopharyngeal swab, presence of viral mutation(s) within the  areas targeted by this assay, and inadequate number of viral copies  (<250 copies / mL). A negative result must be combined with clinical  observations, patient history, and epidemiological information. If result is POSITIVE SARS-CoV-2 target nucleic acids are DETECTED. The SARS-CoV-2 RNA is generally detectable in upper and lower  respiratory specimens dur ing the acute phase of infection.  Positive  results are indicative of active infection with SARS-CoV-2.  Clinical  correlation with patient history and other diagnostic information is  necessary to determine patient infection status.  Positive results do  not rule out bacterial infection or co-infection with other viruses. If result is PRESUMPTIVE POSTIVE SARS-CoV-2 nucleic acids MAY BE PRESENT.   A presumptive positive result was obtained on the  submitted specimen  and confirmed on repeat testing.  While 2019 novel coronavirus  (SARS-CoV-2) nucleic acids may be present in the submitted sample  additional confirmatory testing may be necessary for epidemiological  and / or clinical management purposes  to differentiate between  SARS-CoV-2 and other Sarbecovirus currently known to infect humans.  If clinically indicated additional testing with an alternate test  methodology [phone]) is advised. The SARS-CoV-2 RNA is generally  detectable in upper and lower respiratory sp ecimens during the acute  phase of infection. The expected result is Negative. Fact Sheet for Patients:  StrictlyIdeas.no Fact Sheet for Healthcare Providers: BankingDealers.co.za This test is not yet approved or cleared by the [place] FDA and has been authorized for detection and/or diagnosis of SARS-CoV-2 by FDA under an Emergency Use Authorization (EUA).  This EUA will remain in effect (meaning this test can be used) for the duration of the COVID-19 declaration under Section 564(b)(1) of the Act, 21 U.S.C. section 360bbb-3(b)(1), unless the authorization is terminated or revoked sooner. Performed at [facility], [place] [address], [place], [place] [address]   Urine culture     Status: None   Collection Time: 11/24/18  5:00 PM   Specimen: In/Out Cath Urine  Result Value Ref Range Status   Specimen Description   Final    IN/OUT CATH URINE Performed at [facility] [address], [place], [place] [address]    Special Requests   Final    NONE Performed at [facility], [place] [address], [place], [place] [address]    Culture   Final    NO GROWTH Performed at [facility], [place] [address] [place], [place] [address]    Report Status 11/25/2018 FINAL  Final  MRSA PCR Screening     Status: None   Collection Time: 11/24/18  6:53 PM   Specimen:  Nasopharyngeal  Result Value Ref Range Status   MRSA by PCR NEGATIVE NEGATIVE Final    Comment:        The GeneXpert MRSA Assay (FDA approved for NASAL specimens only), is one component of a comprehensive MRSA colonization surveillance program. It is not intended to diagnose MRSA infection nor to guide or monitor treatment for MRSA infections. Performed at [facility], [place] [address], [place], [place] [address]   Culture, body fluid-bottle     Status: None (Preliminary result)   Collection Time: 11/25/18  2:07 PM   Specimen: Pleura  Result Value Ref Range Status   Specimen Description PLEURAL FLUID  Final   Special Requests   Final    BOTTLES DRAWN AEROBIC AND ANAEROBIC Blood Culture adequate volume   Culture   Final    NO GROWTH < 24 HOURS Performed at [facility], [place] [address], [place], [place] [address]    Report Status PENDING  Incomplete  Gram stain     Status: None   Collection Time: 11/25/18  2:07 PM   Specimen: Pleura  Result Value Ref Range Status   Specimen Description PLEURAL FLUID  Final   Special Requests NONE  Final   Gram Stain   Final    WBC PRESENT,BOTH PMN AND MONONUCLEAR NO ORGANISMS SEEN CYTOSPIN SMEAR Performed at [facility], [address], [place], [place] [address]    Report Status 11/25/2018 FINAL  Final     Studies: Dg Chest 1 View  Result Date: 11/25/2018 CLINICAL DATA:  Status post RIGHT thoracentesis. EXAM: CHEST  1 VIEW COMPARISON:  None. FINDINGS: The RIGHT pleural effusion has decreased in size. Mild RIGHT basilar atelectasis persists. There is no evidence of pneumothorax. A LEFT Port-A-Cath is again noted. The LEFT lung is clear. IMPRESSION: Decreased RIGHT pleural effusion with mild persistent RIGHT basilar atelectasis. No pneumothorax. Electronically Signed   By: Margarette Canada M.D.   On: 11/25/2018 14:07   Ct Angio Chest Pe W Or Wo Contrast  Result Date: 11/24/2018 CLINICAL DATA:  History of  metastatic colon carcinoma with fever and abdominal pain EXAM: CT ANGIOGRAPHY CHEST CT ABDOMEN AND PELVIS WITH CONTRAST TECHNIQUE: Multidetector CT imaging of the chest was performed using the standard protocol during bolus administration of intravenous contrast. Multiplanar CT image reconstructions and MIPs were obtained to evaluate the vascular anatomy. Multidetector CT imaging of the abdomen and pelvis was performed using the standard protocol during bolus administration of intravenous contrast. CONTRAST:  110mL OMNIPAQUE IOHEXOL 350 MG/ML SOLN COMPARISON:  Similar exam dated 11/13/2018 FINDINGS: CTA CHEST FINDINGS Cardiovascular: Thoracic aorta demonstrates a normal branching pattern. No aneurysmal dilatation or dissection is seen. The heart is normal in size and configuration. The pulmonary artery shows a normal branching pattern. No definitive filling defect to suggest pulmonary embolism is noted. Mediastinum/Nodes: Thoracic inlet is within normal limits. The esophagus is within normal limits. Scattered mediastinal lymph nodes are again identified similar to that seen on the recent restaging examination from 11 days previous. Stable right hilar lymph node is noted as well. Lungs/Pleura: The lungs are well aerated bilaterally. Nodules are again identified bilaterally. The overall appearance is stable from the prior exam from 11 days previous. An enlarging right-sided pleural effusion is seen. New superimposed right lower lobe and right middle lobe infiltrate is seen. Musculoskeletal: Degenerative changes of the thoracic spine are noted. No metastatic lesions are seen. Left-sided chest wall port is noted Review of the MIP images confirms the above findings. CT ABDOMEN and PELVIS FINDINGS Hepatobiliary: Liver again demonstrates a hypodense lesion within the right lobe consistent with metastatic disease measuring 4.2 cm in greatest dimension. Small hypodensity in the dome of the liver is noted also stable from the  prior study. Gallbladder demonstrates vicarious excretion of contrast. No ductal dilatation is seen. Pancreas: Unremarkable. No pancreatic ductal dilatation or surrounding inflammatory changes. Spleen: Normal in size without focal abnormality. Adrenals/Urinary Tract: Adrenal glands are within normal limits. Kidneys demonstrate no renal calculi or obstructive changes bilaterally. Normal excretion of contrast is seen. The bladder is decompressed. Stomach/Bowel:  Colon is well visualized. Postsurgical changes are noted in the mid transverse colon consistent with the prior surgical history. There is again noted soft tissue mass in the mesentery adjacent to the anastomotic site best seen in the coronal plane on image number 40 of series 4. Just proximal to the small bowel colon anastomotic site there is some mild dilatation of the distal small bowel identified. Multiple edematous loops of small bowel are noted proximal to the anastomotic site. A mild partial small bowel obstruction at the anastomotic site cannot be totally excluded. Vascular/Lymphatic: No aneurysmal dilatation is seen. The encasement of the superior mesenteric artery and vein by the adjacent mesenteric mass is less well visualized on today's exam due to the timing of the contrast bolus. There does remain however venous engorgement of the small bowel contributing to the degree of small bowel wall edema and likely ascites. Reproductive: Prostate is unremarkable. Other: Increase in ascites is noted both in the pelvis as well as surrounding the bowel loops and liver. Peritoneal implant is again noted near the umbilicus. Musculoskeletal: Degenerative changes of lumbar spine are seen. Review of the MIP images confirms the above findings. IMPRESSION: CTA of the chest: No evidence of pulmonary emboli are identified. Stable right hilar and mediastinal adenopathy is seen. Stable metastatic lesions within both lungs. Increasing right-sided pleural effusion with  associated right lower and middle lobe infiltrates CT of the abdomen and pelvis: Postsurgical changes consistent with the given clinical history. There remains a mesenteric mass near the anastomotic site similar to that seen on the recent exam. Stable appearing hepatic metastatic disease is noted. Persistent venous engorgement is noted related to vascular encasement with new small bowel edematous changes identified. Some mild dilatation of the distal small bowel proximal to the anastomosis is seen. The possibility of mild narrowing at the anastomosis deserves consideration. This was not present on the most recent exam. This may be accentuated by the venous engorgement and small-bowel wall edema. Increasing ascites. There are changes consistent with peritoneal implantation stable from the prior exam. Electronically Signed   By: Inez Catalina M.D.   On: 11/24/2018 18:13   Ct Abdomen Pelvis W Contrast  Result Date: 11/24/2018 CLINICAL DATA:  History of metastatic colon carcinoma with fever and abdominal pain EXAM: CT ANGIOGRAPHY CHEST CT ABDOMEN AND PELVIS WITH CONTRAST TECHNIQUE: Multidetector CT imaging of the chest was performed using the standard protocol during bolus administration of intravenous contrast. Multiplanar CT image reconstructions and MIPs were obtained to evaluate the vascular anatomy. Multidetector CT imaging of the abdomen and pelvis was performed using the standard protocol during bolus administration of intravenous contrast. CONTRAST:  87mL OMNIPAQUE IOHEXOL 350 MG/ML SOLN COMPARISON:  Similar exam dated 11/13/2018 FINDINGS: CTA CHEST FINDINGS Cardiovascular: Thoracic aorta demonstrates a normal branching pattern. No aneurysmal dilatation or dissection is seen. The heart is normal in size and configuration. The pulmonary artery shows a normal branching pattern. No definitive filling defect to suggest pulmonary embolism is noted. Mediastinum/Nodes: Thoracic inlet is within normal limits. The  esophagus is within normal limits. Scattered mediastinal lymph nodes are again identified similar to that seen on the recent restaging examination from 11 days previous. Stable right hilar lymph node is noted as well. Lungs/Pleura: The lungs are well aerated bilaterally. Nodules are again identified bilaterally. The overall appearance is stable from the prior exam from 11 days previous. An enlarging right-sided pleural effusion is seen. New superimposed right lower lobe and right middle lobe infiltrate is seen. Musculoskeletal: Degenerative changes  of the thoracic spine are noted. No metastatic lesions are seen. Left-sided chest wall port is noted Review of the MIP images confirms the above findings. CT ABDOMEN and PELVIS FINDINGS Hepatobiliary: Liver again demonstrates a hypodense lesion within the right lobe consistent with metastatic disease measuring 4.2 cm in greatest dimension. Small hypodensity in the dome of the liver is noted also stable from the prior study. Gallbladder demonstrates vicarious excretion of contrast. No ductal dilatation is seen. Pancreas: Unremarkable. No pancreatic ductal dilatation or surrounding inflammatory changes. Spleen: Normal in size without focal abnormality. Adrenals/Urinary Tract: Adrenal glands are within normal limits. Kidneys demonstrate no renal calculi or obstructive changes bilaterally. Normal excretion of contrast is seen. The bladder is decompressed. Stomach/Bowel: Colon is well visualized. Postsurgical changes are noted in the mid transverse colon consistent with the prior surgical history. There is again noted soft tissue mass in the mesentery adjacent to the anastomotic site best seen in the coronal plane on image number 40 of series 4. Just proximal to the small bowel colon anastomotic site there is some mild dilatation of the distal small bowel identified. Multiple edematous loops of small bowel are noted proximal to the anastomotic site. A mild partial small bowel  obstruction at the anastomotic site cannot be totally excluded. Vascular/Lymphatic: No aneurysmal dilatation is seen. The encasement of the superior mesenteric artery and vein by the adjacent mesenteric mass is less well visualized on today's exam due to the timing of the contrast bolus. There does remain however venous engorgement of the small bowel contributing to the degree of small bowel wall edema and likely ascites. Reproductive: Prostate is unremarkable. Other: Increase in ascites is noted both in the pelvis as well as surrounding the bowel loops and liver. Peritoneal implant is again noted near the umbilicus. Musculoskeletal: Degenerative changes of lumbar spine are seen. Review of the MIP images confirms the above findings. IMPRESSION: CTA of the chest: No evidence of pulmonary emboli are identified. Stable right hilar and mediastinal adenopathy is seen. Stable metastatic lesions within both lungs. Increasing right-sided pleural effusion with associated right lower and middle lobe infiltrates CT of the abdomen and pelvis: Postsurgical changes consistent with the given clinical history. There remains a mesenteric mass near the anastomotic site similar to that seen on the recent exam. Stable appearing hepatic metastatic disease is noted. Persistent venous engorgement is noted related to vascular encasement with new small bowel edematous changes identified. Some mild dilatation of the distal small bowel proximal to the anastomosis is seen. The possibility of mild narrowing at the anastomosis deserves consideration. This was not present on the most recent exam. This may be accentuated by the venous engorgement and small-bowel wall edema. Increasing ascites. There are changes consistent with peritoneal implantation stable from the prior exam. Electronically Signed   By: Inez Catalina M.D.   On: 11/24/2018 18:13   US Thoracentesis Asp Pleural Space W/img Guide  Result Date: 11/25/2018 INDICATION: Patient with  history of shortness of breath, pneumonia, right pleural effusion. Request made for diagnostic and therapeutic thoracentesis. EXAM: ULTRASOUND GUIDED RIGHT DIAGNOSTIC AND THERAPEUTIC THORACENTESIS MEDICATIONS: 10 mL 1% lidocaine COMPLICATIONS: None immediate. PROCEDURE: An ultrasound guided thoracentesis was thoroughly discussed with the patient and questions answered. The benefits, risks, alternatives and complications were also discussed. The patient understands and wishes to proceed with the procedure. Written consent was obtained. Ultrasound was performed to localize and mark an adequate pocket of fluid in the right chest. The area was then prepped and draped in the  normal sterile fashion. 1% Lidocaine was used for local anesthesia. Under ultrasound guidance a 6 Fr Safe-T-Centesis catheter was introduced. Thoracentesis was performed. The catheter was removed and a dressing applied. FINDINGS: A total of approximately 1.3 liters of clear, yellow fluid was removed. Samples were sent to the laboratory as requested by the clinical team. IMPRESSION: Successful ultrasound guided diagnostic and therapeutic right thoracentesis yielding 1.3 liters of pleural fluid. Read by: Brynda Greathouse PA-C Electronically Signed   By: Markus Daft M.D.   On: 11/25/2018 14:33    Scheduled Meds:  budesonide  0.5 mg Nebulization BID   Chlorhexidine Gluconate Cloth  6 each Topical Daily   feeding supplement (ENSURE ENLIVE)  237 mL Oral BID BM   fentaNYL  1 patch Transdermal Q72H   gabapentin  200 mg Oral TID   multivitamin with minerals  1 tablet Oral Daily   pantoprazole  40 mg Oral BID   polyethylene glycol  17 g Oral BID   senna-docusate  1 tablet Oral BID   venlafaxine XR  150 mg Oral Q breakfast   Continuous Infusions:  sodium chloride 75 mL/hr at 11/26/18 0257   sodium chloride     ceFEPime (MAXIPIME) IV      Principal Problem:   Sepsis ([place]) Active Problems:   Metastatic colon cancer to liver  ([place])   HCAP (healthcare-associated pneumonia)   Pleural effusion  Irwin Brakeman, MD [facility] 11/26/2018, 2:57 PM    LOS: 2 days  How to contact the [facility] Attending or Consulting provider [place] or covering provider during after hours [place], for this patient?  1. Check the care team in [facility] and look for a) attending/consulting [facility] provider listed and b) the [facility] team listed 2. Log into [email or web address] and use [place]'s universal password to access. If you do not have the password, please contact the hospital operator. 3. Locate the [facility] provider you are looking for under [facility] and page to a number that you can be directly reached. 4. If you still have difficulty reaching the provider, please page the [facility] (Director on Call) for the Hospitalists listed on amion for assistance.

## 2018-11-26 NOTE — Progress Notes (Addendum)
HEMATOLOGY-ONCOLOGY PROGRESS NOTE  SUBJECTIVE: Feels better this morning. Right sided posterior ribs pain much better after thoracentesis. Gram stain negative. Culture pending. Fluid has been sent for cytology which is pending. Breathing better. Had one heme positive stool. Reports having a black stool again this morning. Afebrile. Other VS stable. Wants to go home today.   Oncology History Overview Note  Cancer Staging Metastatic colon cancer to liver [facility]) Staging form: Colon and Rectum, AJCC 8th Edition - Clinical stage from 06/01/2016: Stage IVA (cTX, cNX, pM1a) - Signed by Truitt Merle, MD on 07/04/2016 - Pathologic stage from 06/14/2016: Stage IVA (pT4b(m), pN2b, pM1a) - Signed by Truitt Merle, MD on 07/04/2016     Metastatic colon cancer to liver ([place])  04/2015 Procedure   Colonoscopy by Dr. Ladona Horns. It showed showed 2 sessile polyps ready between 3-5 mm in size located 20 cm (A, B) from the point of entry, polypectomy was performed. Pedunculated polyp was found in the ascending colon (C), polypectomy was performed, and additional polyp (D) was found 30 cm from the point of entry, removed   04/2015 Pathology Results   tubular adenoma (A and B), and well differentiated adenocarcinoma arising from tubulovillous adenoma (C) and well differentiated adenocarcinoma arising from severe dysplasia to intramucosal carcinoma within tubular adenoma.    04/2015 Initial Diagnosis   Metastatic colon cancer to liver ([place])   05/29/2016 Imaging   CT abdomen and pelvis with contrast showed an apple core like stricture in right colon just above the cecum, measuring 3.2 cm in lengths. This is highly suspicious for malignancy. Small lymph node a noticed he had adjacent mesentery, measuring 8 mm. There is a low-density lesion within the inferior aspect of the right hepatic lobe measuring 1.7 cm, suspicious for metastasis.    06/06/2016 Tumor Marker   CEA 9.99   06/08/2016 PET scan   IMPRESSION: Approximately 3 cm  hypermetabolic mass in the ascending colon, consistent with primary colon carcinoma. This mass results in colonic obstruction and small bowel dilatation. Additional areas of hypermetabolic wall thickening in the cecum may represent other sites of colon carcinoma or colitis. Mild hypermetabolic lymphadenopathy in right pericolonic region, porta hepatis, and aortocaval space, consistent with metastatic disease. Mild hypermetabolic mediastinal lymphadenopathy also seen, and thoracic lymph node metastases cannot be excluded. Solitary hypermetabolic focus in inferior right hepatic lobe, consistent with liver metastasis. Consider abdomen MRI without and with contrast for further evaluation.   06/14/2016 Surgery   Hand assisted right hemicolectomy and small bowel resection for colon cancer, liver biopsy, by Dr. Barry Dienes   06/14/2016 Pathology Results   Right hemicolectomy showed invasive well to moderately differentiated adenocarcinoma, 2 foci measuring 7.5 cm and 4.5 cm, tumor invades through full thickness of colon, to the seroma and involve the Small Bowel, Surgical Margins Were Negative, 24 Out Of 64 Lymph Nodes Were Positive, Extracapsular Extension Identified, Multiple Satellite Tumor Deposits Present, Liver Biopsy Showed Metastatic Adenocarcinoma.     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 No BRAF and NRAS mutation. MSI-stable, tumor burden low.   07/06/2016 Tumor Marker   CEA 13.69   07/13/2016 - 01/18/2017 Chemotherapy   mFOLFOX, every 2 weeks, started on 07/14/2015, Avastin added from cycle 3  Oxaliplatin dose to 53m/m2 due to side effects and some cytopenia on 09/21/16  Changed to FOLFIRINOX starting cycle 7 and Reduced  Due to neuropathy hold  Oxaliplatin and add Irinotecan with neulasta on day 3 starting with cycle 7 Add low dose Oxaliplatin with cycle 8.  Due to his  worsening neuropathy, and good response to chemotherapy, I previously stopped oxaliplatin from cycle 11, and continue FOLFIRI and avastin      07/27/2016 Tumor Marker   CEA 15.85   09/04/2016 Imaging   Ct C/A/P W Contrast IMPRESSION: Interval right colectomy. Stable small liver metastasis in the inferior right hepatic lobe. Stable mild porta hepatis and aortocaval lymphadenopathy. Stable mild mediastinal lymphadenopathy. No new or progressive metastatic disease identified within the chest, abdomen, or pelvis.   12/19/2016 PET scan   IMPRESSION: 1. Right hemicolectomy, with resolution of the prior hypermetabolic activity inferiorly in the right hepatic lobe, in several mediastinal lymph nodes, and in lymph nodes in the retroperitoneum and porta hepatis. No residual hypermetabolic or enlarged lymph nodes are identified. 2. Low-grade diffuse skeletal metabolic activity is likely therapy related. 3. Coronary atherosclerosis. 4. 3 by 4 mm right middle lobe pulmonary nodule is stable, not appreciably hypermetabolic, but below sensitive PET-CT size thresholds. This may warrant surveillance.   01/11/2017 Imaging   MR Abdomen W WO Contrast IMPRESSION: 1. No acute findings within the abdomen. Previously noted liver metastasis has resolved in the interval. No new lesions.   02/01/2017 - 09/2017 Chemotherapy   Maintenance therapy, Xeloda 2070m (1000 mg/m2)  q12h on day 1-14 every 21 days plus AVASTIN, starting 02/01/2017.  stopped after 12 days due to poor tolerance on 02/14/17  Changed to maintenance 5-FU and avastin every 2 weeks starting on 02/22/17, stopped in 09/2017.      04/10/2017 Imaging   IMPRESSION: 1. Status post right hemicolectomy without findings for recurrent tumor. 2. No worrisome hepatic lesions. Treated disease with only a small residual low attenuation lesion in the right hepatic lobe. 3. No recurrent mediastinal or abdominal lymphadenopathy.    07/09/2017 PET scan   PET  07/09/17 IMPRESSION: 1. Status post right hemicolectomy, without findings of hypermetabolic recurrent or metastatic disease. 2. A right middle lobe pulmonary nodule is unchanged. However, there is a right lower lobe 5 mm pulmonary nodule which is felt to be new and enlarged compared to prior exams. Suspicious for an isolated pulmonary metastasis. Consider CT follow-up at 3-6 months. 3. Age advanced coronary artery atherosclerosis. Recommend assessment of coronary risk factors and consideration of medical therapy. 4. Borderline ascending aortic dilatation, 4.0 cm     10/10/2017 Imaging   IMPRESSION: 1. Several (at least 10) subcentimeter pulmonary nodules scattered in both lungs, predominantly in the lower lobes, all new/increased, most compatible with enlarging pulmonary metastases, largest 8 mm in the right lower lobe. 2. No additional findings of new or progressive metastatic disease. No recurrent adenopathy. Stable small low-attenuation lesion in the inferior right liver lobe compatible with treated metastasis. No new liver metastases. 3. Stable ectatic 4.0 cm ascending thoracic aorta. Recommend annual imaging followup by CTA or MRA.  4. Stable mild splenomegaly.    10/11/2017 - 12/2017 Chemotherapy   FOLFIRI and Avastin every 2 weeks starting 10/11/17, irnotecan stopped in 12/2017 due to disease progression     01/08/2018 Imaging   01/08/2018 CT CAP IMPRESSION: 1. Progressive hepatic and pulmonary metastatic disease. 2. Ascending Aortic aneurysm NOS (ICD10-I71.9).   01/08/2018 Progression   01/08/2018 CT CAP IMPRESSION: 1. Progressive hepatic and pulmonary metastatic disease. 2. Ascending Aortic aneurysm NOS (ICD10-I71.9).   01/10/2018 - 03/07/2018 Chemotherapy   FOLFOX and Avastin every 2 weeks starting on 01/10/2018. Stopped due  to disease progression    03/19/2018 Progression   03/19/2018 CT CAP IMPRESSION: 1. Interval increase in size of dominant nodule within the  right lower lobe. There are a few additional nodules which have increased in size. Multiple additional small bilateral pulmonary nodules are grossly similar. 2. Slight interval increase in size of lesion within the right hepatic lobe. 3. Interval increase in soft tissue at the surgical anastomosis involving the small bowel within the central abdomen. Locally recurrent disease not excluded.    03/21/2018 - 05/15/2018 Chemotherapy   Third lineXeloda 1523m BID 7 days on/7 days off and Avastin every 2 weeksstarting 04/07/18 stopped after 1 cycle and 4 days because he started Clinical trail.    05/22/2018 Imaging   [facility]   LINES AND TUBES: None.  LOWER THORAX: Please see same day CT chest for findings above the diaphragm..Marland Kitchen HEPATOBILIARY: Low-attenuation lesion in segment 5 has minimally increased in size from prior measuring 2.4 x 2.0 cm, previously 2.3 x 1.8 cm when remeasured (8:42). The gallbladder is present and otherwise unremarkable. No biliary dilatation.   SPLEEN: Unremarkable. PANCREAS: Unremarkable.  ADRENALS: Unremarkable. KIDNEYS/URETERS: Unremarkable.  BLADDER: Decompressed making further evaluation difficult. PELVIC/REPRODUCTIVE ORGANS: Unremarkable.  GI TRACT: No dilated or thick walled loops of bowel. Sequelae of right hemicolectomy and appendectomy.   PERITONEUM/RETROPERITONEUM AND MESENTERY: Minimally decreased spiculated mesenteric soft tissue density at the site of enteroenteric anastomosis, now measuring roughly 2.6 x 1.4 cm, previously 3.3 x 3.1 cm (8:54). There is also a mild amount of stranding near the anastomosis within the mid abdomen (8:54)   LYMPH NODES: Borderline enlarged 1.0 cm porta hepatic lymph node, previously 0.7 cm (8:26). VESSELS: The aorta is normal in caliber.  No significant calcified atherosclerotic disease. Though the contrast timing is suboptimal for evaluation of the portal venous system, the portal venous  system is appears patent. The hepatic veins and IVC are unremarkable.  BONES AND SOFT TISSUES: Small fat-containing ventral hernia..   05/24/2018 - 08/02/2018 Chemotherapy   [id number] with palbociclib (Ibrnace) 1032m3 weeks on/1 week off and binimetinib 15 mg BID starting 05/24/18. Stopped 08/02/18 due to disease progression.    08/02/2018 [facility]   [facility]  Since 05/22/2018:  -Unchanged to mildly decreased in size segment 6 hepatic lesion.  -Unchanged irregular soft tissue density at the enteroenteric anastomosis.  -Increase in size of an irregular soft tissue density at the enterocolic anastomosis. This is new since 03/19/2018.  -Mild decrease in size of porta hepatis lymph node.  IMPRESSION:  Slight interval increase in multiple metastatic pulmonary nodules.  New likely malignant small right pleural effusion. Interval prominent right infrahilar lymph nodes; metastasis is in the differential. Mild interlobular septal thickening in the right middle and lower lobes is concerning for lymphangitic tumor spread. Attention on follow-up is suggested.     08/29/2018 Imaging   CT AP 08/29/18 IMPRESSION: 1. No acute CT findings of the abdomen or pelvis to explain abdominal pain.  2. Evidence of worsening metastatic colon malignancy status post right colon resection, including increasing mesenteric nodularity, increasing peritoneal nodularity, enlarging liver metastasis, and new and enlarging pulmonary nodules.  3. Trace perihepatic ascites, of uncertain etiology, although presumably malignant given peritoneal metastatic disease.   09/02/2018 Imaging   CT chest 09/02/18  IMPRESSION: 1. Unfortunately there is interval increase in size of bilateral pulmonary nodules consistent with progression of pulmonary metastasis. 2. New small moderate RIGHT pleural effusion.  3. Interval increase in size of small mediastinal lymph nodes  most consistent with metastatic adenopathy. 4. Interval increase in size of hepatic metastasis in the inferior RIGHT hepatic lobe compared to CT 03/19/2018.   09/04/2018 - 11/2018 Chemotherapy   oral Regorafenib 45m daily 3 weeks on, 1 week off and IV Nivolumab 369mkg q2weeks starting 09/04/18. Nivo d/c after cycle 2 due to lack of coverage. Will stop week of 11/18/18 due to disease progression.    11/13/2018 Imaging   CT CAP W Constrast  IMPRESSION: 1. There is been interval enlargement of segment 6 liver metastases. Additionally mesenteric tumor at the enterocolonic anastomosis is mildly increased in size from previous exam. Peritoneal deposit along at the umbilicus is mildly increased in size in the interval. New ascites. Findings are concerning for mild progression of disease within the abdomen and pelvis. 2. No significant change in diffuse pulmonary metastases. Stable volume of right pleural effusion. 3. Previous index mesenteric lymph nodes are mildly increased in size in the interval. 4. New small pericardial effusion.    Chemotherapy   PENDING [place] M-F 2 weeks on/2 weeks off starting in 2 weeks      REVIEW OF SYSTEMS:   Constitutional: Denies fever and chills.  Eyes: Denies blurriness of vision Ears, nose, mouth, throat, and face: Denies mucositis or sore throat Respiratory: Shortness of breath and cough better. Cardiovascular: Denies palpitation, chest discomfort Gastrointestinal:  Denies nausea and vomiting.  Reports having a black stool yesterday.  No obvious bleeding. Skin: Denies abnormal skin rashes Lymphatics: Denies new lymphadenopathy or easy bruising Neurological:Denies numbness, tingling or new weaknesses Behavioral/Psych: Mood is stable, no new changes  Extremities: No lower extremity edema All other systems were reviewed with the patient and are negative.  I have reviewed the past medical history, past surgical history, social history and family history  with the patient and they are unchanged from previous note.   PHYSICAL EXAMINATION: ECOG PERFORMANCE STATUS: 1 - Symptomatic but completely ambulatory  Vitals:   11/25/18 2052 11/26/18 0436  BP: 129/89 111/88  Pulse: (!) 101 92  Resp: 20 17  Temp: 97.6 F (36.4 C) 98.1 F (36.7 C)  SpO2: 96% 93%   Filed Weights   11/24/18 1121 11/24/18 1832  Weight: 176 lb 5.9 oz (80 kg) 198 lb 10.2 oz (90.1 kg)    Intake/Output from previous day: 07/13 0701 - 07/14 0700 In: 1713.2 [P.O.:960; I.V.:573; IV Piggyback:180.2] Out: 200 [Urine:200]  GENERAL:alert, no distress OROPHARYNX: no thrush or mucositis LYMPH:  no palpable lymphadenopathy in the cervical, axillary or inguinal LUNGS: scattered expiratory wheezes noted. HEART: regular rate & rhythm and no murmurs and no lower extremity edema ABDOMEN: Positive bowel sounds, mild distention.  No pain with palpation. Musculoskeletal:no cyanosis of digits and no clubbing  NEURO: alert & oriented x 3 with fluent speech, no focal motor/sensory deficits  LABORATORY DATA:  I have reviewed the data as listed CMP Latest Ref Rng & Units 11/26/2018 11/25/2018 11/24/2018  Glucose 70 - 99 mg/dL 81 96 109(H)  BUN 6 - 20 mg/dL 23(H) 32(H) 32(H)  Creatinine 0.61 - 1.24 mg/dL 1.35(H) 1.71(H) 1.79(H)  Sodium 135 - 145 mmol/L 135 135 133(L)  Potassium 3.5 - 5.1 mmol/L 4.2 4.1 4.5  Chloride 98 - 111 mmol/L 109 109 107  CO2 22 - 32 mmol/L 18(L) 18(L) 19(L)  Calcium 8.9 - 10.3 mg/dL 7.7(L) 7.6(L) 7.7(L)  Total Protein 6.5 - 8.1 g/dL 5.1(L) - 5.8(L)  Total Bilirubin 0.3 - 1.2 mg/dL 0.2(L) -  0.5  Alkaline Phos 38 - 126 U/L 56 - 67  AST 15 - 41 U/L 24 - 28  ALT 0 - 44 U/L 20 - 22    Lab Results  Component Value Date   WBC 3.5 (L) 11/26/2018   HGB 8.0 (L) 11/26/2018   HCT 26.6 (L) 11/26/2018   MCV 96.4 11/26/2018   PLT 139 (L) 11/26/2018   NEUTROABS 3.3 11/24/2018    Dg Chest 1 View  Result Date: 11/25/2018 CLINICAL DATA:  Status post RIGHT  thoracentesis. EXAM: CHEST  1 VIEW COMPARISON:  None. FINDINGS: The RIGHT pleural effusion has decreased in size. Mild RIGHT basilar atelectasis persists. There is no evidence of pneumothorax. A LEFT Port-A-Cath is again noted. The LEFT lung is clear. IMPRESSION: Decreased RIGHT pleural effusion with mild persistent RIGHT basilar atelectasis. No pneumothorax. Electronically Signed   By: Margarette Canada M.D.   On: 11/25/2018 14:07   Dg Chest 2 View  Result Date: 11/05/2018 CLINICAL DATA:  History of metastatic colon cancer. Cough for 2-3 months. EXAM: CHEST - 2 VIEW COMPARISON:  CT chest 09/02/2018.  PA and lateral chest 02/24/2017. FINDINGS: The patient has a small right pleural effusion and basilar atelectasis. Scattered pulmonary nodules are identified, most prominent in the right lower lobe. No pneumothorax. No consolidative process. Heart size is normal. No acute or focal bony abnormality. IMPRESSION: Small right pleural effusion and pulmonary nodules. The appearance is not markedly changed compared to the prior chest CT. Electronically Signed   By: Inge Rise M.D.   On: 11/05/2018 08:58   Ct Chest W Contrast  Result Date: 11/13/2018 CLINICAL DATA:  Restaging metastatic colon cancer EXAM: CT CHEST, ABDOMEN, AND PELVIS WITH CONTRAST TECHNIQUE: Multidetector CT imaging of the chest, abdomen and pelvis was performed following the standard protocol during bolus administration of intravenous contrast. CONTRAST:  124m OMNIPAQUE IOHEXOL 300 MG/ML  SOLN COMPARISON:  CT chest 09/02/2018 and CT AP 08/29/2018 FINDINGS: CT CHEST FINDINGS Cardiovascular: The heart size appears normal. Small pericardial effusion, new. Lad coronary artery calcifications. Mediastinum/Nodes: Normal appearance of the thyroid gland. The trachea appears patent and is midline. Normal appearance of the esophagus. -index right paratracheal lymph node measures 1.2 cm, image 14/2. Previously 1.0 cm. Index right paratracheal lymph node measures  1 cm, image 16/2. Previously 0.9 cm. Index right pre-vascular node measures 0.9 cm, image 23/2. Previously 0.7 cm. Right hilar node measures 2 cm, image 33/2.  Previously 1.3 cm. Lungs/Pleura: Similar small to moderate right pleural effusion. Index posteromedial right lower lobe lung nodule measures 1 cm, image 80/6. Previously 0.9 cm. Index superior segment of left lower lobe lung nodule measures 1.9 cm, image 44/6. Unchanged. Index left upper lobe lung nodule measures 0.6 cm, image 69/6. Previously 0.5 cm. Index right upper lobe lung nodule measures 0.6 cm, image 44/6. Previously 0.7 cm. Index right middle lobe perifissural nodule measures 1 cm, image 91/6. Previously 0.8 cm. Index perifissural right lower lobe lung nodule measures 0.8 cm, image 112/6. Previously 1 cm. Musculoskeletal: No chest wall mass or suspicious bone lesions identified. CT ABDOMEN PELVIS FINDINGS Hepatobiliary: Index lesion within segment 6 measures 4.2 cm, image 72/2. Previously 3.2 cm. New, tiny nonspecific hypodensity along the dome measures 7 mm. Gallbladder normal. No biliary dilatation. Pancreas: Unremarkable. No pancreatic ductal dilatation or surrounding inflammatory changes. Spleen: The spleen is enlarged measuring 15.3 cm in cranial caudal dimension, unchanged. No focal splenic lesion. Adrenals/Urinary Tract: Normal adrenal glands. No kidney mass or hydronephrosis. Urinary bladder normal. Stomach/Bowel:  The stomach is nondistended. Status post right hemicolectomy with entero colonic anastomosis. 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 Previously 4.0 x 4.8 cm. No evidence for high-grade bowel obstruction. Vascular/Lymphatic: Normal appearance of the abdominal aorta. Tumor encasement of the ileocolic branches of the superior mesenteric artery and vein identified. There is venous congestion identified within the associated ileocolic mesentery. Portal vein remains patent. Reproductive: Prostate is  unremarkable. Other: New small volume ascites identified. Peritoneal implant at the level of the umbilicus measures 2.7 by 1.4 cm, image 84/2. Previously 2.0 x 1.3 cm. Musculoskeletal: No acute or significant osseous findings. IMPRESSION: 1. There is been interval enlargement of segment 6 liver metastases. Additionally mesenteric tumor at the enterocolonic anastomosis is mildly increased in size from previous exam. Peritoneal deposit along at the umbilicus is mildly increased in size in the interval. New ascites. Findings are concerning for mild progression of disease within the abdomen and pelvis. 2. No significant change in diffuse pulmonary metastases. Stable volume of right pleural effusion. 3. Previous index mesenteric lymph nodes are mildly increased in size in the interval. 4. New small pericardial effusion. Electronically Signed   By: Kerby Moors M.D.   On: 11/13/2018 13:27   Ct Angio Chest Pe W Or Wo Contrast  Result Date: 11/24/2018 CLINICAL DATA:  History of metastatic colon carcinoma with fever and abdominal pain EXAM: CT ANGIOGRAPHY CHEST CT ABDOMEN AND PELVIS WITH CONTRAST TECHNIQUE: Multidetector CT imaging of the chest was performed using the standard protocol during bolus administration of intravenous contrast. Multiplanar CT image reconstructions and MIPs were obtained to evaluate the vascular anatomy. Multidetector CT imaging of the abdomen and pelvis was performed using the standard protocol during bolus administration of intravenous contrast. CONTRAST:  37m OMNIPAQUE IOHEXOL 350 MG/ML SOLN COMPARISON:  Similar exam dated 11/13/2018 FINDINGS: CTA CHEST FINDINGS Cardiovascular: Thoracic aorta demonstrates a normal branching pattern. No aneurysmal dilatation or dissection is seen. The heart is normal in size and configuration. The pulmonary artery shows a normal branching pattern. No definitive filling defect to suggest pulmonary embolism is noted. Mediastinum/Nodes: Thoracic inlet is  within normal limits. The esophagus is within normal limits. Scattered mediastinal lymph nodes are again identified similar to that seen on the recent restaging examination from 11 days previous. Stable right hilar lymph node is noted as well. Lungs/Pleura: The lungs are well aerated bilaterally. Nodules are again identified bilaterally. The overall appearance is stable from the prior exam from 11 days previous. An enlarging right-sided pleural effusion is seen. New superimposed right lower lobe and right middle lobe infiltrate is seen. Musculoskeletal: Degenerative changes of the thoracic spine are noted. No metastatic lesions are seen. Left-sided chest wall port is noted Review of the MIP images confirms the above findings. CT ABDOMEN and PELVIS FINDINGS Hepatobiliary: Liver again demonstrates a hypodense lesion within the right lobe consistent with metastatic disease measuring 4.2 cm in greatest dimension. Small hypodensity in the dome of the liver is noted also stable from the prior study. Gallbladder demonstrates vicarious excretion of contrast. No ductal dilatation is seen. Pancreas: Unremarkable. No pancreatic ductal dilatation or surrounding inflammatory changes. Spleen: Normal in size without focal abnormality. Adrenals/Urinary Tract: Adrenal glands are within normal limits. Kidneys demonstrate no renal calculi or obstructive changes bilaterally. Normal excretion of contrast is seen. The bladder is decompressed. Stomach/Bowel: Colon is well visualized. Postsurgical changes are noted in the mid transverse colon consistent with the prior surgical history. There is again noted soft tissue mass  in the mesentery adjacent to the anastomotic site best seen in the coronal plane on image number 40 of series 4. Just proximal to the small bowel colon anastomotic site there is some mild dilatation of the distal small bowel identified. Multiple edematous loops of small bowel are noted proximal to the anastomotic site. A  mild partial small bowel obstruction at the anastomotic site cannot be totally excluded. Vascular/Lymphatic: No aneurysmal dilatation is seen. The encasement of the superior mesenteric artery and vein by the adjacent mesenteric mass is less well visualized on today's exam due to the timing of the contrast bolus. There does remain however venous engorgement of the small bowel contributing to the degree of small bowel wall edema and likely ascites. Reproductive: Prostate is unremarkable. Other: Increase in ascites is noted both in the pelvis as well as surrounding the bowel loops and liver. Peritoneal implant is again noted near the umbilicus. Musculoskeletal: Degenerative changes of lumbar spine are seen. Review of the MIP images confirms the above findings. IMPRESSION: CTA of the chest: No evidence of pulmonary emboli are identified. Stable right hilar and mediastinal adenopathy is seen. Stable metastatic lesions within both lungs. Increasing right-sided pleural effusion with associated right lower and middle lobe infiltrates CT of the abdomen and pelvis: Postsurgical changes consistent with the given clinical history. There remains a mesenteric mass near the anastomotic site similar to that seen on the recent exam. Stable appearing hepatic metastatic disease is noted. Persistent venous engorgement is noted related to vascular encasement with new small bowel edematous changes identified. Some mild dilatation of the distal small bowel proximal to the anastomosis is seen. The possibility of mild narrowing at the anastomosis deserves consideration. This was not present on the most recent exam. This may be accentuated by the venous engorgement and small-bowel wall edema. Increasing ascites. There are changes consistent with peritoneal implantation stable from the prior exam. Electronically Signed   By: Inez Catalina M.D.   On: 11/24/2018 18:13   Ct Abdomen Pelvis W Contrast  Result Date: 11/24/2018 CLINICAL DATA:   History of metastatic colon carcinoma with fever and abdominal pain EXAM: CT ANGIOGRAPHY CHEST CT ABDOMEN AND PELVIS WITH CONTRAST TECHNIQUE: Multidetector CT imaging of the chest was performed using the standard protocol during bolus administration of intravenous contrast. Multiplanar CT image reconstructions and MIPs were obtained to evaluate the vascular anatomy. Multidetector CT imaging of the abdomen and pelvis was performed using the standard protocol during bolus administration of intravenous contrast. CONTRAST:  47m OMNIPAQUE IOHEXOL 350 MG/ML SOLN COMPARISON:  Similar exam dated 11/13/2018 FINDINGS: CTA CHEST FINDINGS Cardiovascular: Thoracic aorta demonstrates a normal branching pattern. No aneurysmal dilatation or dissection is seen. The heart is normal in size and configuration. The pulmonary artery shows a normal branching pattern. No definitive filling defect to suggest pulmonary embolism is noted. Mediastinum/Nodes: Thoracic inlet is within normal limits. The esophagus is within normal limits. Scattered mediastinal lymph nodes are again identified similar to that seen on the recent restaging examination from 11 days previous. Stable right hilar lymph node is noted as well. Lungs/Pleura: The lungs are well aerated bilaterally. Nodules are again identified bilaterally. The overall appearance is stable from the prior exam from 11 days previous. An enlarging right-sided pleural effusion is seen. New superimposed right lower lobe and right middle lobe infiltrate is seen. Musculoskeletal: Degenerative changes of the thoracic spine are noted. No metastatic lesions are seen. Left-sided chest wall port is noted Review of the MIP images confirms the above findings.  CT ABDOMEN and PELVIS FINDINGS Hepatobiliary: Liver again demonstrates a hypodense lesion within the right lobe consistent with metastatic disease measuring 4.2 cm in greatest dimension. Small hypodensity in the dome of the liver is noted also  stable from the prior study. Gallbladder demonstrates vicarious excretion of contrast. No ductal dilatation is seen. Pancreas: Unremarkable. No pancreatic ductal dilatation or surrounding inflammatory changes. Spleen: Normal in size without focal abnormality. Adrenals/Urinary Tract: Adrenal glands are within normal limits. Kidneys demonstrate no renal calculi or obstructive changes bilaterally. Normal excretion of contrast is seen. The bladder is decompressed. Stomach/Bowel: Colon is well visualized. Postsurgical changes are noted in the mid transverse colon consistent with the prior surgical history. There is again noted soft tissue mass in the mesentery adjacent to the anastomotic site best seen in the coronal plane on image number 40 of series 4. Just proximal to the small bowel colon anastomotic site there is some mild dilatation of the distal small bowel identified. Multiple edematous loops of small bowel are noted proximal to the anastomotic site. A mild partial small bowel obstruction at the anastomotic site cannot be totally excluded. Vascular/Lymphatic: No aneurysmal dilatation is seen. The encasement of the superior mesenteric artery and vein by the adjacent mesenteric mass is less well visualized on today's exam due to the timing of the contrast bolus. There does remain however venous engorgement of the small bowel contributing to the degree of small bowel wall edema and likely ascites. Reproductive: Prostate is unremarkable. Other: Increase in ascites is noted both in the pelvis as well as surrounding the bowel loops and liver. Peritoneal implant is again noted near the umbilicus. Musculoskeletal: Degenerative changes of lumbar spine are seen. Review of the MIP images confirms the above findings. IMPRESSION: CTA of the chest: No evidence of pulmonary emboli are identified. Stable right hilar and mediastinal adenopathy is seen. Stable metastatic lesions within both lungs. Increasing right-sided pleural  effusion with associated right lower and middle lobe infiltrates CT of the abdomen and pelvis: Postsurgical changes consistent with the given clinical history. There remains a mesenteric mass near the anastomotic site similar to that seen on the recent exam. Stable appearing hepatic metastatic disease is noted. Persistent venous engorgement is noted related to vascular encasement with new small bowel edematous changes identified. Some mild dilatation of the distal small bowel proximal to the anastomosis is seen. The possibility of mild narrowing at the anastomosis deserves consideration. This was not present on the most recent exam. This may be accentuated by the venous engorgement and small-bowel wall edema. Increasing ascites. There are changes consistent with peritoneal implantation stable from the prior exam. Electronically Signed   By: Inez Catalina M.D.   On: 11/24/2018 18:13   Ct Abdomen Pelvis W Contrast  Result Date: 11/13/2018 CLINICAL DATA:  Restaging metastatic colon cancer EXAM: CT CHEST, ABDOMEN, AND PELVIS WITH CONTRAST TECHNIQUE: Multidetector CT imaging of the chest, abdomen and pelvis was performed following the standard protocol during bolus administration of intravenous contrast. CONTRAST:  128m OMNIPAQUE IOHEXOL 300 MG/ML  SOLN COMPARISON:  CT chest 09/02/2018 and CT AP 08/29/2018 FINDINGS: CT CHEST FINDINGS Cardiovascular: The heart size appears normal. Small pericardial effusion, new. Lad coronary artery calcifications. Mediastinum/Nodes: Normal appearance of the thyroid gland. The trachea appears patent and is midline. Normal appearance of the esophagus. -index right paratracheal lymph node measures 1.2 cm, image 14/2. Previously 1.0 cm. Index right paratracheal lymph node measures 1 cm, image 16/2. Previously 0.9 cm. Index right pre-vascular node measures 0.9 cm,  image 23/2. Previously 0.7 cm. Right hilar node measures 2 cm, image 33/2.  Previously 1.3 cm. Lungs/Pleura: Similar small to  moderate right pleural effusion. Index posteromedial right lower lobe lung nodule measures 1 cm, image 80/6. Previously 0.9 cm. Index superior segment of left lower lobe lung nodule measures 1.9 cm, image 44/6. Unchanged. Index left upper lobe lung nodule measures 0.6 cm, image 69/6. Previously 0.5 cm. Index right upper lobe lung nodule measures 0.6 cm, image 44/6. Previously 0.7 cm. Index right middle lobe perifissural nodule measures 1 cm, image 91/6. Previously 0.8 cm. Index perifissural right lower lobe lung nodule measures 0.8 cm, image 112/6. Previously 1 cm. Musculoskeletal: No chest wall mass or suspicious bone lesions identified. CT ABDOMEN PELVIS FINDINGS Hepatobiliary: Index lesion within segment 6 measures 4.2 cm, image 72/2. Previously 3.2 cm. New, tiny nonspecific hypodensity along the dome measures 7 mm. Gallbladder normal. No biliary dilatation. Pancreas: Unremarkable. No pancreatic ductal dilatation or surrounding inflammatory changes. Spleen: The spleen is enlarged measuring 15.3 cm in cranial caudal dimension, unchanged. No focal splenic lesion. Adrenals/Urinary Tract: Normal adrenal glands. No kidney mass or hydronephrosis. Urinary bladder normal. Stomach/Bowel: The stomach is nondistended. Status post right hemicolectomy with entero colonic anastomosis. 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 Previously 4.0 x 4.8 cm. No evidence for high-grade bowel obstruction. Vascular/Lymphatic: Normal appearance of the abdominal aorta. Tumor encasement of the ileocolic branches of the superior mesenteric artery and vein identified. There is venous congestion identified within the associated ileocolic mesentery. Portal vein remains patent. Reproductive: Prostate is unremarkable. Other: New small volume ascites identified. Peritoneal implant at the level of the umbilicus measures 2.7 by 1.4 cm, image 84/2. Previously 2.0 x 1.3 cm. Musculoskeletal: No acute or significant  osseous findings. IMPRESSION: 1. There is been interval enlargement of segment 6 liver metastases. Additionally mesenteric tumor at the enterocolonic anastomosis is mildly increased in size from previous exam. Peritoneal deposit along at the umbilicus is mildly increased in size in the interval. New ascites. Findings are concerning for mild progression of disease within the abdomen and pelvis. 2. No significant change in diffuse pulmonary metastases. Stable volume of right pleural effusion. 3. Previous index mesenteric lymph nodes are mildly increased in size in the interval. 4. New small pericardial effusion. Electronically Signed   By: Kerby Moors M.D.   On: 11/13/2018 13:27   Dg Chest Port 1 View  Result Date: 11/24/2018 CLINICAL DATA:  Fever, cough, shortness of breath, nausea. EXAM: PORTABLE CHEST 1 VIEW COMPARISON:  November 05, 2018 FINDINGS: Left subclavian approach injectable port in stable position. Cardiomediastinal silhouette is mildly enlarged. Mediastinal contours appear intact. The left lung is clear. Right cardiophrenic airspace opacity and probable right pleural effusion. Previously noted by CT small pulmonary nodules are not seen radiographically. Osseous structures are without acute abnormality. Soft tissues are grossly normal. IMPRESSION: Right cardiophrenic airspace opacity and probable right pleural effusion. Electronically Signed   By: Fidela Salisbury M.D.   On: 11/24/2018 12:19   US Thoracentesis Asp Pleural Space W/img Guide  Result Date: 11/25/2018 INDICATION: Patient with history of shortness of breath, pneumonia, right pleural effusion. Request made for diagnostic and therapeutic thoracentesis. EXAM: ULTRASOUND GUIDED RIGHT DIAGNOSTIC AND THERAPEUTIC THORACENTESIS MEDICATIONS: 10 mL 1% lidocaine COMPLICATIONS: None immediate. PROCEDURE: An ultrasound guided thoracentesis was thoroughly discussed with the patient and questions answered. The benefits, risks, alternatives and  complications were also discussed. The patient understands and wishes to proceed with the procedure. Written  consent was obtained. Ultrasound was performed to localize and mark an adequate pocket of fluid in the right chest. The area was then prepped and draped in the normal sterile fashion. 1% Lidocaine was used for local anesthesia. Under ultrasound guidance a 6 Fr Safe-T-Centesis catheter was introduced. Thoracentesis was performed. The catheter was removed and a dressing applied. FINDINGS: A total of approximately 1.3 liters of clear, yellow fluid was removed. Samples were sent to the laboratory as requested by the clinical team. IMPRESSION: Successful ultrasound guided diagnostic and therapeutic right thoracentesis yielding 1.3 liters of pleural fluid. Read by: Brynda Greathouse PA-C Electronically Signed   By: Markus Daft M.D.   On: 11/25/2018 14:33    ASSESSMENT AND PLAN: 1.  Metastatic colorectal cancer 2.  Pneumonia 3.  Sepsis secondary to #2, resolved 4.  Acute kidney injury 5.  Right pleural effusion 6.  Anemia secondary to iron deficiency, recent chemotherapy, and underlying malignancy 7.  Depression and anxiety 8.  Chronic pain 9.  Goals of care discussion  -Recently completed regorafenib and this has been stopped secondary to disease progression. The patient may be eligible for a clinical trial at [facility]. Will make arrangements for follow up at [facility] as an outpatient.  -Continue antibiotics per hospitalist.  Cultures negative to date. -Renal function improving. IVF per hospitalist -Await cytology from pleural fluid. -Stool for occult blood positive. GI has been consulted. May need upper endoscopy. Hemoglobin remains stable. No transfusion indicated..  -Continue venlafaxine and Lorazepam -Continue fentanyl patch and oxycodone -Per previous discussions, the patient understands the goal of care is palliative and his overall prognosis is poor. In the past, he has expressed desire to be a DNR,  but wishes to be a FULL CODE at this time.    LOS: 2 days   Mikey Bussing, DNP, AGPCNP-BC, AOCNP 11/26/18  Addendum  I have seen the patient, examined him. I agree with the assessment and and plan and have edited the notes.   [name] feels overall better this morning, calm, denies new symptoms. I discussed his pleural effusion test results, unlikely empyema, culture and cytology pending. Will consult GI for possible upper GI bleeding. Dr. Truman Hayward from [facility] contacted me yesterday regarding the opportunity of a phase 1 clinical trial of a MEK inhibitor. I discussed with pt, he is interested, will let Dr. Truman Hayward know. I discussed the rapid cancer progression lately, and he may not be able to wait long time if the screening for clinical trail takes a while or If the washout time from last treatment requires 3-4 weeks or longer. He will get Lonsurf filled but hold it for now while he is recovering from pneumonia and being evaluate for phase 1 clinical trial. I called is wife Tye [place] and updated her, all questions answered. Will continue f/u.  Truitt Merle  11/26/2018

## 2018-11-27 ENCOUNTER — Encounter (HOSPITAL_COMMUNITY)
Admission: EM | Disposition: A | Discharge status: Home / Self Care | Payer: Self-pay | Source: Home / Self Care | Attending: Family Medicine

## 2018-11-27 ENCOUNTER — Inpatient Hospital Stay (HOSPITAL_COMMUNITY): Payer: BC Managed Care – PPO | Admitting: Certified Registered Nurse Anesthetist

## 2018-11-27 ENCOUNTER — Encounter (HOSPITAL_COMMUNITY): Payer: Self-pay

## 2018-11-27 HISTORY — PX: ESOPHAGOGASTRODUODENOSCOPY (EGD) WITH PROPOFOL: SHX5813

## 2018-11-27 LAB — CBC
HCT: 26.6 % — ABNORMAL LOW (ref 39.0–52.0)
Hemoglobin: 7.9 g/dL — ABNORMAL LOW (ref 13.0–17.0)
MCH: 28.5 pg (ref 26.0–34.0)
MCHC: 29.7 g/dL — ABNORMAL LOW (ref 30.0–36.0)
MCV: 96 fL (ref 80.0–100.0)
Platelets: 130 10*3/uL — ABNORMAL LOW (ref 150–400)
RBC: 2.77 MIL/uL — ABNORMAL LOW (ref 4.22–5.81)
RDW: 15.7 % — ABNORMAL HIGH (ref 11.5–15.5)
WBC: 3.6 10*3/uL — ABNORMAL LOW (ref 4.0–10.5)
nRBC: 0 % (ref 0.0–0.2)

## 2018-11-27 LAB — COMPREHENSIVE METABOLIC PANEL
ALT: 21 U/L (ref 0–44)
AST: 27 U/L (ref 15–41)
Albumin: 2.6 g/dL — ABNORMAL LOW (ref 3.5–5.0)
Alkaline Phosphatase: 53 U/L (ref 38–126)
Anion gap: 7 (ref 5–15)
BUN: 17 mg/dL (ref 6–20)
CO2: 19 mmol/L — ABNORMAL LOW (ref 22–32)
Calcium: 7.9 mg/dL — ABNORMAL LOW (ref 8.9–10.3)
Chloride: 111 mmol/L (ref 98–111)
Creatinine, Ser: 1.17 mg/dL (ref 0.61–1.24)
GFR calc Af Amer: >60 mL/min (ref >60)
GFR calc non Af Amer: >60 mL/min (ref >60)
Glucose, Bld: 78 mg/dL (ref 70–99)
Potassium: 3.9 mmol/L (ref 3.5–5.1)
Sodium: 137 mmol/L (ref 135–145)
Total Bilirubin: 0.4 mg/dL (ref 0.3–1.2)
Total Protein: 5.2 g/dL — ABNORMAL LOW (ref 6.5–8.1)

## 2018-11-27 LAB — LEGIONELLA PNEUMOPHILA SEROGP 1 UR AG: L. pneumophila Serogp 1 Ur Ag: NEGATIVE

## 2018-11-27 SURGERY — ESOPHAGOGASTRODUODENOSCOPY (EGD) WITH PROPOFOL
Anesthesia: Monitor Anesthesia Care

## 2018-11-27 MED ORDER — PROPOFOL 500 MG/50ML IV EMUL
INTRAVENOUS | Status: DC | PRN
  Administered 2018-11-27: 150 ug/kg/min via INTRAVENOUS

## 2018-11-27 MED ORDER — LABETALOL HCL 5 MG/ML IV SOLN
5.0000 mg | INTRAVENOUS | Status: AC | PRN
  Administered 2018-11-27 (×4): 5 mg via INTRAVENOUS

## 2018-11-27 MED ORDER — ENSURE ENLIVE PO LIQD: 237.0000 mL | Freq: Two times a day (BID) | ORAL | 12 refills | Status: DC

## 2018-11-27 MED ORDER — PROPOFOL 10 MG/ML IV BOLUS
INTRAVENOUS | Status: AC
  Filled 2018-11-27: qty 20

## 2018-11-27 MED ORDER — HYDRALAZINE HCL 20 MG/ML IJ SOLN
5.0000 mg | Freq: Once | INTRAMUSCULAR | Status: AC
  Administered 2018-11-27: 14:00:00 5 mg via INTRAVENOUS

## 2018-11-27 MED ORDER — HYDRALAZINE HCL 20 MG/ML IJ SOLN
INTRAMUSCULAR | Status: AC
  Filled 2018-11-27: qty 1

## 2018-11-27 MED ORDER — LEVOFLOXACIN 750 MG PO TABS: 750.0000 mg | ORAL_TABLET | Freq: Every day | ORAL | 0 refills | Status: DC

## 2018-11-27 MED ORDER — HEPARIN SOD (PORK) LOCK FLUSH 100 UNIT/ML IV SOLN
500.0000 [IU] | INTRAVENOUS | Status: AC | PRN
  Administered 2018-11-27: 500 [IU]

## 2018-11-27 MED ORDER — LEVOFLOXACIN 750 MG PO TABS
750.0000 mg | ORAL_TABLET | Freq: Every day | ORAL | Status: DC
  Filled 2018-11-27: qty 1

## 2018-11-27 MED ORDER — PROPOFOL 10 MG/ML IV BOLUS
INTRAVENOUS | Status: DC | PRN
  Administered 2018-11-27: 30 mg via INTRAVENOUS

## 2018-11-27 MED ORDER — ONDANSETRON HCL 4 MG PO TABS: 4.0000 mg | ORAL_TABLET | Freq: Four times a day (QID) | ORAL | 0 refills | Status: DC | PRN

## 2018-11-27 MED ORDER — LABETALOL HCL 5 MG/ML IV SOLN
INTRAVENOUS | Status: AC
  Filled 2018-11-27: qty 4

## 2018-11-27 MED ORDER — LIDOCAINE 2% (20 MG/ML) 5 ML SYRINGE
INTRAMUSCULAR | Status: DC | PRN
  Administered 2018-11-27: 100 mg via INTRAVENOUS

## 2018-11-27 MED ORDER — LACTATED RINGERS IV SOLN
INTRAVENOUS | Status: DC | PRN
  Administered 2018-11-27: 14:00:00 via INTRAVENOUS

## 2018-11-27 MED ORDER — LOSARTAN POTASSIUM 50 MG PO TABS
100.0000 mg | ORAL_TABLET | Freq: Every day | ORAL | Status: DC
  Administered 2018-11-27: 16:00:00 100 mg via ORAL
  Filled 2018-11-27: qty 2

## 2018-11-27 SURGICAL SUPPLY — 15 items

## 2018-11-27 NOTE — Op Note (Signed)
[facility] Patient Name: Nicholas Lowery Procedure Date: 11/27/2018 MRN: [id number] Attending MD: Wonda Horner , MD Date of Birth: Jan 31, 1961 CSN: [id number] Age: 58 Admit Type: Inpatient Procedure:                Upper GI endoscopy Indications:              Melena Providers:                Wonda Horner, MD, Raynelle Bring, RN, William Dalton, Technician Referring MD:              Medicines:                Propofol per Anesthesia Complications:            No immediate complications. Estimated Blood Loss:     Estimated blood loss: none. Procedure:                Pre-Anesthesia Assessment:                           - Prior to the procedure, a History and Physical                            was performed, and patient medications and                            allergies were reviewed. The patient's tolerance of                            previous anesthesia was also reviewed. The risks                            and benefits of the procedure and the sedation                            options and risks were discussed with the patient.                            All questions were answered, and informed consent                            was obtained. Prior Anticoagulants: The patient has                            taken no previous anticoagulant or antiplatelet                            agents. ASA Grade Assessment: III - A patient with                            severe systemic disease. After reviewing the risks  and benefits, the patient was deemed in                            satisfactory condition to undergo the procedure.                           After obtaining informed consent, the endoscope was                            passed under direct vision. Throughout the                            procedure, the patient's blood pressure, pulse, and                            oxygen saturations were monitored  continuously. The                            [id number]-H190 ([id number]) Olympus gastroscope was                            introduced through the mouth, and advanced to the                            second part of duodenum. The upper GI endoscopy was                            accomplished without difficulty. The patient                            tolerated the procedure well. Scope In: Scope Out: Findings:      The examined esophagus was normal.      The entire examined stomach was normal.      The examined duodenum was normal. Impression:               - Normal esophagus.                           - Normal stomach.                           - Normal examined duodenum.                           - No specimens collected. Moderate Sedation:      . Recommendation:           - Resume regular diet.                           - Next step would be to do a colonoscopy to                            evaluate melena. He has metastatic colon cancer and  is under the care of Dr Burr Medico from oncology. He                            wants to go home and this could be done as an                            outpatient. Procedure Code(s):        --- Professional ---                           [phone], Esophagogastroduodenoscopy, flexible,                            transoral; diagnostic, including collection of                            specimen(s) by brushing or washing, when performed                            (separate procedure) Diagnosis Code(s):        --- Professional ---                           K92.1, Melena (includes Hematochezia) CPT copyright 2019 [employer]. All rights reserved. The codes documented in this report are preliminary and upon coder review may  be revised to meet current compliance requirements. Wonda Horner, MD 11/27/2018 2:35:29 PM This report has been signed electronically. Number of Addenda: 0

## 2018-11-27 NOTE — Progress Notes (Signed)
SATURATION QUALIFICATIONS: (This note is used to comply with regulatory documentation for home oxygen)  Patient Saturations on Room Air at Rest =95%  Patient Saturations on Room Air while Ambulating = 90%  Virginia Rochester, RN

## 2018-11-27 NOTE — Progress Notes (Addendum)
HEMATOLOGY-ONCOLOGY PROGRESS NOTE  SUBJECTIVE: Patient is scheduled for an upper endoscopy today.  He is hoping to be discharged after the procedure.  Denies bleeding.  Breathing has improved.  Right posterior rib pain resolved.  Remains afebrile.  Oncology History Overview Note  Cancer Staging Metastatic colon cancer to liver [facility]) Staging form: Colon and Rectum, AJCC 8th Edition - Clinical stage from 06/01/2016: Stage IVA (cTX, cNX, pM1a) - Signed by Truitt Merle, MD on 07/04/2016 - Pathologic stage from 06/14/2016: Stage IVA (pT4b(m), pN2b, pM1a) - Signed by Truitt Merle, MD on 07/04/2016     Metastatic colon cancer to liver ([place])  04/2015 Procedure   Colonoscopy by Dr. Ladona Horns. It showed showed 2 sessile polyps ready between 3-5 mm in size located 20 cm (A, B) from the point of entry, polypectomy was performed. Pedunculated polyp was found in the ascending colon (C), polypectomy was performed, and additional polyp (D) was found 30 cm from the point of entry, removed   04/2015 Pathology Results   tubular adenoma (A and B), and well differentiated adenocarcinoma arising from tubulovillous adenoma (C) and well differentiated adenocarcinoma arising from severe dysplasia to intramucosal carcinoma within tubular adenoma.    04/2015 Initial Diagnosis   Metastatic colon cancer to liver ([place])   05/29/2016 Imaging   CT abdomen and pelvis with contrast showed an apple core like stricture in right colon just above the cecum, measuring 3.2 cm in lengths. This is highly suspicious for malignancy. Small lymph node a noticed he had adjacent mesentery, measuring 8 mm. There is a low-density lesion within the inferior aspect of the right hepatic lobe measuring 1.7 cm, suspicious for metastasis.    06/06/2016 Tumor Marker   CEA 9.99   06/08/2016 PET scan   IMPRESSION: Approximately 3 cm hypermetabolic mass in the ascending colon, consistent with primary colon carcinoma. This mass results in colonic obstruction and  small bowel dilatation. Additional areas of hypermetabolic wall thickening in the cecum may represent other sites of colon carcinoma or colitis. Mild hypermetabolic lymphadenopathy in right pericolonic region, porta hepatis, and aortocaval space, consistent with metastatic disease. Mild hypermetabolic mediastinal lymphadenopathy also seen, and thoracic lymph node metastases cannot be excluded. Solitary hypermetabolic focus in inferior right hepatic lobe, consistent with liver metastasis. Consider abdomen MRI without and with contrast for further evaluation.   06/14/2016 Surgery   Hand assisted right hemicolectomy and small bowel resection for colon cancer, liver biopsy, by Dr. Barry Dienes   06/14/2016 Pathology Results   Right hemicolectomy showed invasive well to moderately differentiated adenocarcinoma, 2 foci measuring 7.5 cm and 4.5 cm, tumor invades through full thickness of colon, to the seroma and involve the Small Bowel, Surgical Margins Were Negative, 24 Out Of 64 Lymph Nodes Were Positive, Extracapsular Extension Identified, Multiple Satellite Tumor Deposits Present, Liver Biopsy Showed Metastatic Adenocarcinoma.     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 No BRAF and NRAS mutation. MSI-stable, tumor burden low.   07/06/2016 Tumor Marker   CEA 13.69   07/13/2016 - 01/18/2017 Chemotherapy   mFOLFOX, every 2 weeks, started on 07/14/2015, Avastin added from cycle 3  Oxaliplatin dose to '60mg'$ /m2 due to side effects and some cytopenia on 09/21/16  Changed to FOLFIRINOX starting cycle 7 and Reduced  Due to neuropathy hold Oxaliplatin and add Irinotecan with neulasta on day 3 starting with cycle 7 Add low dose Oxaliplatin with  cycle 8.  Due to his worsening neuropathy, and good response to chemotherapy, I previously stopped oxaliplatin from cycle 11, and continue FOLFIRI and  avastin      07/27/2016 Tumor Marker   CEA 15.85   09/04/2016 Imaging   Ct C/A/P W Contrast IMPRESSION: Interval right colectomy. Stable small liver metastasis in the inferior right hepatic lobe. Stable mild porta hepatis and aortocaval lymphadenopathy. Stable mild mediastinal lymphadenopathy. No new or progressive metastatic disease identified within the chest, abdomen, or pelvis.   12/19/2016 PET scan   IMPRESSION: 1. Right hemicolectomy, with resolution of the prior hypermetabolic activity inferiorly in the right hepatic lobe, in several mediastinal lymph nodes, and in lymph nodes in the retroperitoneum and porta hepatis. No residual hypermetabolic or enlarged lymph nodes are identified. 2. Low-grade diffuse skeletal metabolic activity is likely therapy related. 3. Coronary atherosclerosis. 4. 3 by 4 mm right middle lobe pulmonary nodule is stable, not appreciably hypermetabolic, but below sensitive PET-CT size thresholds. This may warrant surveillance.   01/11/2017 Imaging   MR Abdomen W WO Contrast IMPRESSION: 1. No acute findings within the abdomen. Previously noted liver metastasis has resolved in the interval. No new lesions.   02/01/2017 - 09/2017 Chemotherapy   Maintenance therapy, Xeloda '2000mg'$  (1000 mg/m2)  q12h on day 1-14 every 21 days plus AVASTIN, starting 02/01/2017.  stopped after 12 days due to poor tolerance on 02/14/17  Changed to maintenance 5-FU and avastin every 2 weeks starting on 02/22/17, stopped in 09/2017.      04/10/2017 Imaging   IMPRESSION: 1. Status post right hemicolectomy without findings for recurrent tumor. 2. No worrisome hepatic lesions. Treated disease with only a small residual low attenuation lesion in the right hepatic lobe. 3. No recurrent mediastinal or abdominal lymphadenopathy.    07/09/2017 PET scan   PET 07/09/17 IMPRESSION: 1. Status post right hemicolectomy, without findings of hypermetabolic recurrent or metastatic disease. 2.  A right middle lobe pulmonary nodule is unchanged. However, there is a right lower lobe 5 mm pulmonary nodule which is felt to be new and enlarged compared to prior exams. Suspicious for an isolated pulmonary metastasis. Consider CT follow-up at 3-6 months. 3. Age advanced coronary artery atherosclerosis. Recommend assessment of coronary risk factors and consideration of medical therapy. 4. Borderline ascending aortic dilatation, 4.0 cm     10/10/2017 Imaging   IMPRESSION: 1. Several (at least 10) subcentimeter pulmonary nodules scattered in both lungs, predominantly in the lower lobes, all new/increased, most compatible with enlarging pulmonary metastases, largest 8 mm in the right lower lobe. 2. No additional findings of new or progressive metastatic disease. No recurrent adenopathy. Stable small low-attenuation lesion in the inferior right liver lobe compatible with treated metastasis. No new liver metastases. 3. Stable ectatic 4.0 cm ascending thoracic aorta. Recommend annual imaging followup by CTA or MRA.  4. Stable mild splenomegaly.    10/11/2017 - 12/2017 Chemotherapy   FOLFIRI and Avastin every 2 weeks starting 10/11/17, irnotecan stopped in 12/2017 due to disease progression     01/08/2018 Imaging   01/08/2018 CT CAP IMPRESSION: 1. Progressive hepatic and pulmonary metastatic disease. 2. Ascending Aortic aneurysm NOS (ICD10-I71.9).   01/08/2018 Progression   01/08/2018 CT CAP IMPRESSION: 1. Progressive hepatic and pulmonary metastatic disease. 2. Ascending Aortic aneurysm NOS (ICD10-I71.9).   01/10/2018 - 03/07/2018 Chemotherapy   FOLFOX and Avastin every 2 weeks starting on 01/10/2018. Stopped due to disease progression    03/19/2018 Progression   03/19/2018 CT CAP IMPRESSION: 1. Interval increase in  size of dominant nodule within the right lower lobe. There are a few additional nodules which have increased in size. Multiple additional small bilateral pulmonary nodules  are grossly similar. 2. Slight interval increase in size of lesion within the right hepatic lobe. 3. Interval increase in soft tissue at the surgical anastomosis involving the small bowel within the central abdomen. Locally recurrent disease not excluded.    03/21/2018 - 05/15/2018 Chemotherapy   Third lineXeloda '1500mg'$  BID 7 days on/7 days off and Avastin every 2 weeksstarting 04/07/18 stopped after 1 cycle and 4 days because he started Clinical trail.    05/22/2018 Imaging   CT CAP [facility] 05/22/18 at [facility] FINDINGS:   LINES AND TUBES: None.  LOWER THORAX: Please see same day CT chest for findings above the diaphragm.Marland Kitchen  HEPATOBILIARY: Low-attenuation lesion in segment 5 has minimally increased in size from prior measuring 2.4 x 2.0 cm, previously 2.3 x 1.8 cm when remeasured (8:42). The gallbladder is present and otherwise unremarkable. No biliary dilatation.   SPLEEN: Unremarkable. PANCREAS: Unremarkable.  ADRENALS: Unremarkable. KIDNEYS/URETERS: Unremarkable.  BLADDER: Decompressed making further evaluation difficult. PELVIC/REPRODUCTIVE ORGANS: Unremarkable.  GI TRACT: No dilated or thick walled loops of bowel. Sequelae of right hemicolectomy and appendectomy.   PERITONEUM/RETROPERITONEUM AND MESENTERY: Minimally decreased spiculated mesenteric soft tissue density at the site of enteroenteric anastomosis, now measuring roughly 2.6 x 1.4 cm, previously 3.3 x 3.1 cm (8:54). There is also a mild amount of stranding near the anastomosis within the mid abdomen (8:54)   LYMPH NODES: Borderline enlarged 1.0 cm porta hepatic lymph node, previously 0.7 cm (8:26). VESSELS: The aorta is normal in caliber.  No significant calcified atherosclerotic disease. Though the contrast timing is suboptimal for evaluation of the portal venous system, the portal venous system is appears patent. The hepatic veins and IVC are unremarkable.  BONES AND SOFT TISSUES: Small fat-containing ventral hernia..    05/24/2018 - 08/02/2018 Chemotherapy   [id number] with palbociclib (Ibrnace) '100mg'$  3 weeks on/1 week off and binimetinib 15 mg BID starting 05/24/18. Stopped 08/02/18 due to disease progression.    08/02/2018 Imaging   CT CAP 08/02/18 at [facility] IMPRESSION:  Since 05/22/2018:  -Unchanged to mildly decreased in size segment 6 hepatic lesion.  -Unchanged irregular soft tissue density at the enteroenteric anastomosis.  -Increase in size of an irregular soft tissue density at the enterocolic anastomosis. This is new since 03/19/2018.  -Mild decrease in size of porta hepatis lymph node.  IMPRESSION:  Slight interval increase in multiple metastatic pulmonary nodules.  New likely malignant small right pleural effusion. Interval prominent right infrahilar lymph nodes; metastasis is in the differential. Mild interlobular septal thickening in the right middle and lower lobes is concerning for lymphangitic tumor spread. Attention on follow-up is suggested.     08/29/2018 Imaging   CT AP 08/29/18 IMPRESSION: 1. No acute CT findings of the abdomen or pelvis to explain abdominal pain.  2. Evidence of worsening metastatic colon malignancy status post right colon resection, including increasing mesenteric nodularity, increasing peritoneal nodularity, enlarging liver metastasis, and new and enlarging pulmonary nodules.  3. Trace perihepatic ascites, of uncertain etiology, although presumably malignant given peritoneal metastatic disease.   09/02/2018 Imaging   CT chest 09/02/18  IMPRESSION: 1. Unfortunately there is interval increase in size of bilateral pulmonary nodules consistent with progression of pulmonary metastasis. 2. New small moderate RIGHT pleural effusion. 3. Interval increase in size of small mediastinal lymph nodes most consistent with metastatic adenopathy. 4. Interval increase  in size of hepatic metastasis in the inferior RIGHT hepatic lobe compared to CT  03/19/2018.   09/04/2018 - 11/2018 Chemotherapy   oral Regorafenib '80mg'$  daily 3 weeks on, 1 week off and IV Nivolumab '3mg'$ /kg q2weeks starting 09/04/18. Nivo d/c after cycle 2 due to lack of coverage. Will stop week of 11/18/18 due to disease progression.    11/13/2018 Imaging   CT CAP W Constrast  IMPRESSION: 1. There is been interval enlargement of segment 6 liver metastases. Additionally mesenteric tumor at the enterocolonic anastomosis is mildly increased in size from previous exam. Peritoneal deposit along at the umbilicus is mildly increased in size in the interval. New ascites. Findings are concerning for mild progression of disease within the abdomen and pelvis. 2. No significant change in diffuse pulmonary metastases. Stable volume of right pleural effusion. 3. Previous index mesenteric lymph nodes are mildly increased in size in the interval. 4. New small pericardial effusion.    Chemotherapy   PENDING [place] M-F 2 weeks on/2 weeks off starting in 2 weeks     REVIEW OF SYSTEMS:   Constitutional: Denies fever and chills.  Eyes: Denies blurriness of vision Ears, nose, mouth, throat, and face: Denies mucositis or sore throat Respiratory: Shortness of breath and cough better. Cardiovascular: Denies palpitation, chest discomfort Gastrointestinal:  Denies nausea and vomiting.  Skin: Denies abnormal skin rashes Lymphatics: Denies new lymphadenopathy or easy bruising Neurological:Denies numbness, tingling or new weaknesses Behavioral/Psych: Mood is stable, no new changes  Extremities: No lower extremity edema All other systems were reviewed with the patient and are negative.  I have reviewed the past medical history, past surgical history, social history and family history with the patient and they are unchanged from previous note.   PHYSICAL EXAMINATION: ECOG PERFORMANCE STATUS: 1 - Symptomatic but completely ambulatory  Vitals:   11/27/18 1345 11/27/18 1355  BP: (!)  158/108 (!) 158/98  Pulse: 89 88  Resp: 12 16  Temp:    SpO2: 98% 97%   Filed Weights   11/24/18 1121 11/24/18 1832  Weight: 176 lb 5.9 oz (80 kg) 198 lb 10.2 oz (90.1 kg)    Intake/Output from previous day: 07/14 0701 - 07/15 0700 In: 2253.2 [P.O.:120; I.V.:1833.2; IV Piggyback:300] Out: 1050 [Urine:1050]  GENERAL:alert, no distress OROPHARYNX: no thrush or mucositis LYMPH:  no palpable lymphadenopathy in the cervical, axillary or inguinal LUNGS: scattered expiratory wheezes noted. HEART: regular rate & rhythm and no murmurs and no lower extremity edema ABDOMEN: Positive bowel sounds, mild distention.  No pain with palpation. Musculoskeletal:no cyanosis of digits and no clubbing  NEURO: alert & oriented x 3 with fluent speech, no focal motor/sensory deficits  LABORATORY DATA:  I have reviewed the data as listed CMP Latest Ref Rng & Units 11/27/2018 11/26/2018 11/25/2018  Glucose 70 - 99 mg/dL 78 81 96  BUN 6 - 20 mg/dL 17 23(H) 32(H)  Creatinine 0.61 - 1.24 mg/dL 1.17 1.35(H) 1.71(H)  Sodium 135 - 145 mmol/L 137 135 135  Potassium 3.5 - 5.1 mmol/L 3.9 4.2 4.1  Chloride 98 - 111 mmol/L 111 109 109  CO2 22 - 32 mmol/L 19(L) 18(L) 18(L)  Calcium 8.9 - 10.3 mg/dL 7.9(L) 7.7(L) 7.6(L)  Total Protein 6.5 - 8.1 g/dL 5.2(L) 5.1(L) -  Total Bilirubin 0.3 - 1.2 mg/dL 0.4 0.2(L) -  Alkaline Phos 38 - 126 U/L 53 56 -  AST 15 - 41 U/L 27 24 -  ALT 0 - 44 U/L 21 20 -    Lab  Results  Component Value Date   WBC 3.6 (L) 11/27/2018   HGB 7.9 (L) 11/27/2018   HCT 26.6 (L) 11/27/2018   MCV 96.0 11/27/2018   PLT 130 (L) 11/27/2018   NEUTROABS 3.3 11/24/2018    Dg Chest 1 View  Result Date: 11/25/2018 CLINICAL DATA:  Status post RIGHT thoracentesis. EXAM: CHEST  1 VIEW COMPARISON:  None. FINDINGS: The RIGHT pleural effusion has decreased in size. Mild RIGHT basilar atelectasis persists. There is no evidence of pneumothorax. A LEFT Port-A-Cath is again noted. The LEFT lung is clear.  IMPRESSION: Decreased RIGHT pleural effusion with mild persistent RIGHT basilar atelectasis. No pneumothorax. Electronically Signed   By: Margarette Canada M.D.   On: 11/25/2018 14:07   Dg Chest 2 View  Result Date: 11/05/2018 CLINICAL DATA:  History of metastatic colon cancer. Cough for 2-3 months. EXAM: CHEST - 2 VIEW COMPARISON:  CT chest 09/02/2018.  PA and lateral chest 02/24/2017. FINDINGS: The patient has a small right pleural effusion and basilar atelectasis. Scattered pulmonary nodules are identified, most prominent in the right lower lobe. No pneumothorax. No consolidative process. Heart size is normal. No acute or focal bony abnormality. IMPRESSION: Small right pleural effusion and pulmonary nodules. The appearance is not markedly changed compared to the prior chest CT. Electronically Signed   By: Inge Rise M.D.   On: 11/05/2018 08:58   Ct Chest W Contrast  Result Date: 11/13/2018 CLINICAL DATA:  Restaging metastatic colon cancer EXAM: CT CHEST, ABDOMEN, AND PELVIS WITH CONTRAST TECHNIQUE: Multidetector CT imaging of the chest, abdomen and pelvis was performed following the standard protocol during bolus administration of intravenous contrast. CONTRAST:  1102m OMNIPAQUE IOHEXOL 300 MG/ML  SOLN COMPARISON:  CT chest 09/02/2018 and CT AP 08/29/2018 FINDINGS: CT CHEST FINDINGS Cardiovascular: The heart size appears normal. Small pericardial effusion, new. Lad coronary artery calcifications. Mediastinum/Nodes: Normal appearance of the thyroid gland. The trachea appears patent and is midline. Normal appearance of the esophagus. -index right paratracheal lymph node measures 1.2 cm, image 14/2. Previously 1.0 cm. Index right paratracheal lymph node measures 1 cm, image 16/2. Previously 0.9 cm. Index right pre-vascular node measures 0.9 cm, image 23/2. Previously 0.7 cm. Right hilar node measures 2 cm, image 33/2.  Previously 1.3 cm. Lungs/Pleura: Similar small to moderate right pleural effusion. Index  posteromedial right lower lobe lung nodule measures 1 cm, image 80/6. Previously 0.9 cm. Index superior segment of left lower lobe lung nodule measures 1.9 cm, image 44/6. Unchanged. Index left upper lobe lung nodule measures 0.6 cm, image 69/6. Previously 0.5 cm. Index right upper lobe lung nodule measures 0.6 cm, image 44/6. Previously 0.7 cm. Index right middle lobe perifissural nodule measures 1 cm, image 91/6. Previously 0.8 cm. Index perifissural right lower lobe lung nodule measures 0.8 cm, image 112/6. Previously 1 cm. Musculoskeletal: No chest wall mass or suspicious bone lesions identified. CT ABDOMEN PELVIS FINDINGS Hepatobiliary: Index lesion within segment 6 measures 4.2 cm, image 72/2. Previously 3.2 cm. New, tiny nonspecific hypodensity along the dome measures 7 mm. Gallbladder normal. No biliary dilatation. Pancreas: Unremarkable. No pancreatic ductal dilatation or surrounding inflammatory changes. Spleen: The spleen is enlarged measuring 15.3 cm in cranial caudal dimension, unchanged. No focal splenic lesion. Adrenals/Urinary Tract: Normal adrenal glands. No kidney mass or hydronephrosis. Urinary bladder normal. Stomach/Bowel: The stomach is nondistended. Status post right hemicolectomy with entero colonic anastomosis. 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 Previously 4.0 x 4.8 cm. No evidence for  high-grade bowel obstruction. Vascular/Lymphatic: Normal appearance of the abdominal aorta. Tumor encasement of the ileocolic branches of the superior mesenteric artery and vein identified. There is venous congestion identified within the associated ileocolic mesentery. Portal vein remains patent. Reproductive: Prostate is unremarkable. Other: New small volume ascites identified. Peritoneal implant at the level of the umbilicus measures 2.7 by 1.4 cm, image 84/2. Previously 2.0 x 1.3 cm. Musculoskeletal: No acute or significant osseous findings. IMPRESSION: 1. There  is been interval enlargement of segment 6 liver metastases. Additionally mesenteric tumor at the enterocolonic anastomosis is mildly increased in size from previous exam. Peritoneal deposit along at the umbilicus is mildly increased in size in the interval. New ascites. Findings are concerning for mild progression of disease within the abdomen and pelvis. 2. No significant change in diffuse pulmonary metastases. Stable volume of right pleural effusion. 3. Previous index mesenteric lymph nodes are mildly increased in size in the interval. 4. New small pericardial effusion. Electronically Signed   By: Kerby Moors M.D.   On: 11/13/2018 13:27   Ct Angio Chest Pe W Or Wo Contrast  Result Date: 11/24/2018 CLINICAL DATA:  History of metastatic colon carcinoma with fever and abdominal pain EXAM: CT ANGIOGRAPHY CHEST CT ABDOMEN AND PELVIS WITH CONTRAST TECHNIQUE: Multidetector CT imaging of the chest was performed using the standard protocol during bolus administration of intravenous contrast. Multiplanar CT image reconstructions and MIPs were obtained to evaluate the vascular anatomy. Multidetector CT imaging of the abdomen and pelvis was performed using the standard protocol during bolus administration of intravenous contrast. CONTRAST:  20m OMNIPAQUE IOHEXOL 350 MG/ML SOLN COMPARISON:  Similar exam dated 11/13/2018 FINDINGS: CTA CHEST FINDINGS Cardiovascular: Thoracic aorta demonstrates a normal branching pattern. No aneurysmal dilatation or dissection is seen. The heart is normal in size and configuration. The pulmonary artery shows a normal branching pattern. No definitive filling defect to suggest pulmonary embolism is noted. Mediastinum/Nodes: Thoracic inlet is within normal limits. The esophagus is within normal limits. Scattered mediastinal lymph nodes are again identified similar to that seen on the recent restaging examination from 11 days previous. Stable right hilar lymph node is noted as well.  Lungs/Pleura: The lungs are well aerated bilaterally. Nodules are again identified bilaterally. The overall appearance is stable from the prior exam from 11 days previous. An enlarging right-sided pleural effusion is seen. New superimposed right lower lobe and right middle lobe infiltrate is seen. Musculoskeletal: Degenerative changes of the thoracic spine are noted. No metastatic lesions are seen. Left-sided chest wall port is noted Review of the MIP images confirms the above findings. CT ABDOMEN and PELVIS FINDINGS Hepatobiliary: Liver again demonstrates a hypodense lesion within the right lobe consistent with metastatic disease measuring 4.2 cm in greatest dimension. Small hypodensity in the dome of the liver is noted also stable from the prior study. Gallbladder demonstrates vicarious excretion of contrast. No ductal dilatation is seen. Pancreas: Unremarkable. No pancreatic ductal dilatation or surrounding inflammatory changes. Spleen: Normal in size without focal abnormality. Adrenals/Urinary Tract: Adrenal glands are within normal limits. Kidneys demonstrate no renal calculi or obstructive changes bilaterally. Normal excretion of contrast is seen. The bladder is decompressed. Stomach/Bowel: Colon is well visualized. Postsurgical changes are noted in the mid transverse colon consistent with the prior surgical history. There is again noted soft tissue mass in the mesentery adjacent to the anastomotic site best seen in the coronal plane on image number 40 of series 4. Just proximal to the small bowel colon anastomotic site there is some  mild dilatation of the distal small bowel identified. Multiple edematous loops of small bowel are noted proximal to the anastomotic site. A mild partial small bowel obstruction at the anastomotic site cannot be totally excluded. Vascular/Lymphatic: No aneurysmal dilatation is seen. The encasement of the superior mesenteric artery and vein by the adjacent mesenteric mass is less  well visualized on today's exam due to the timing of the contrast bolus. There does remain however venous engorgement of the small bowel contributing to the degree of small bowel wall edema and likely ascites. Reproductive: Prostate is unremarkable. Other: Increase in ascites is noted both in the pelvis as well as surrounding the bowel loops and liver. Peritoneal implant is again noted near the umbilicus. Musculoskeletal: Degenerative changes of lumbar spine are seen. Review of the MIP images confirms the above findings. IMPRESSION: CTA of the chest: No evidence of pulmonary emboli are identified. Stable right hilar and mediastinal adenopathy is seen. Stable metastatic lesions within both lungs. Increasing right-sided pleural effusion with associated right lower and middle lobe infiltrates CT of the abdomen and pelvis: Postsurgical changes consistent with the given clinical history. There remains a mesenteric mass near the anastomotic site similar to that seen on the recent exam. Stable appearing hepatic metastatic disease is noted. Persistent venous engorgement is noted related to vascular encasement with new small bowel edematous changes identified. Some mild dilatation of the distal small bowel proximal to the anastomosis is seen. The possibility of mild narrowing at the anastomosis deserves consideration. This was not present on the most recent exam. This may be accentuated by the venous engorgement and small-bowel wall edema. Increasing ascites. There are changes consistent with peritoneal implantation stable from the prior exam. Electronically Signed   By: Inez Catalina M.D.   On: 11/24/2018 18:13   Ct Abdomen Pelvis W Contrast  Result Date: 11/24/2018 CLINICAL DATA:  History of metastatic colon carcinoma with fever and abdominal pain EXAM: CT ANGIOGRAPHY CHEST CT ABDOMEN AND PELVIS WITH CONTRAST TECHNIQUE: Multidetector CT imaging of the chest was performed using the standard protocol during bolus  administration of intravenous contrast. Multiplanar CT image reconstructions and MIPs were obtained to evaluate the vascular anatomy. Multidetector CT imaging of the abdomen and pelvis was performed using the standard protocol during bolus administration of intravenous contrast. CONTRAST:  85m OMNIPAQUE IOHEXOL 350 MG/ML SOLN COMPARISON:  Similar exam dated 11/13/2018 FINDINGS: CTA CHEST FINDINGS Cardiovascular: Thoracic aorta demonstrates a normal branching pattern. No aneurysmal dilatation or dissection is seen. The heart is normal in size and configuration. The pulmonary artery shows a normal branching pattern. No definitive filling defect to suggest pulmonary embolism is noted. Mediastinum/Nodes: Thoracic inlet is within normal limits. The esophagus is within normal limits. Scattered mediastinal lymph nodes are again identified similar to that seen on the recent restaging examination from 11 days previous. Stable right hilar lymph node is noted as well. Lungs/Pleura: The lungs are well aerated bilaterally. Nodules are again identified bilaterally. The overall appearance is stable from the prior exam from 11 days previous. An enlarging right-sided pleural effusion is seen. New superimposed right lower lobe and right middle lobe infiltrate is seen. Musculoskeletal: Degenerative changes of the thoracic spine are noted. No metastatic lesions are seen. Left-sided chest wall port is noted Review of the MIP images confirms the above findings. CT ABDOMEN and PELVIS FINDINGS Hepatobiliary: Liver again demonstrates a hypodense lesion within the right lobe consistent with metastatic disease measuring 4.2 cm in greatest dimension. Small hypodensity in the dome of the  liver is noted also stable from the prior study. Gallbladder demonstrates vicarious excretion of contrast. No ductal dilatation is seen. Pancreas: Unremarkable. No pancreatic ductal dilatation or surrounding inflammatory changes. Spleen: Normal in size without  focal abnormality. Adrenals/Urinary Tract: Adrenal glands are within normal limits. Kidneys demonstrate no renal calculi or obstructive changes bilaterally. Normal excretion of contrast is seen. The bladder is decompressed. Stomach/Bowel: Colon is well visualized. Postsurgical changes are noted in the mid transverse colon consistent with the prior surgical history. There is again noted soft tissue mass in the mesentery adjacent to the anastomotic site best seen in the coronal plane on image number 40 of series 4. Just proximal to the small bowel colon anastomotic site there is some mild dilatation of the distal small bowel identified. Multiple edematous loops of small bowel are noted proximal to the anastomotic site. A mild partial small bowel obstruction at the anastomotic site cannot be totally excluded. Vascular/Lymphatic: No aneurysmal dilatation is seen. The encasement of the superior mesenteric artery and vein by the adjacent mesenteric mass is less well visualized on today's exam due to the timing of the contrast bolus. There does remain however venous engorgement of the small bowel contributing to the degree of small bowel wall edema and likely ascites. Reproductive: Prostate is unremarkable. Other: Increase in ascites is noted both in the pelvis as well as surrounding the bowel loops and liver. Peritoneal implant is again noted near the umbilicus. Musculoskeletal: Degenerative changes of lumbar spine are seen. Review of the MIP images confirms the above findings. IMPRESSION: CTA of the chest: No evidence of pulmonary emboli are identified. Stable right hilar and mediastinal adenopathy is seen. Stable metastatic lesions within both lungs. Increasing right-sided pleural effusion with associated right lower and middle lobe infiltrates CT of the abdomen and pelvis: Postsurgical changes consistent with the given clinical history. There remains a mesenteric mass near the anastomotic site similar to that seen on  the recent exam. Stable appearing hepatic metastatic disease is noted. Persistent venous engorgement is noted related to vascular encasement with new small bowel edematous changes identified. Some mild dilatation of the distal small bowel proximal to the anastomosis is seen. The possibility of mild narrowing at the anastomosis deserves consideration. This was not present on the most recent exam. This may be accentuated by the venous engorgement and small-bowel wall edema. Increasing ascites. There are changes consistent with peritoneal implantation stable from the prior exam. Electronically Signed   By: Inez Catalina M.D.   On: 11/24/2018 18:13   Ct Abdomen Pelvis W Contrast  Result Date: 11/13/2018 CLINICAL DATA:  Restaging metastatic colon cancer EXAM: CT CHEST, ABDOMEN, AND PELVIS WITH CONTRAST TECHNIQUE: Multidetector CT imaging of the chest, abdomen and pelvis was performed following the standard protocol during bolus administration of intravenous contrast. CONTRAST:  132m OMNIPAQUE IOHEXOL 300 MG/ML  SOLN COMPARISON:  CT chest 09/02/2018 and CT AP 08/29/2018 FINDINGS: CT CHEST FINDINGS Cardiovascular: The heart size appears normal. Small pericardial effusion, new. Lad coronary artery calcifications. Mediastinum/Nodes: Normal appearance of the thyroid gland. The trachea appears patent and is midline. Normal appearance of the esophagus. -index right paratracheal lymph node measures 1.2 cm, image 14/2. Previously 1.0 cm. Index right paratracheal lymph node measures 1 cm, image 16/2. Previously 0.9 cm. Index right pre-vascular node measures 0.9 cm, image 23/2. Previously 0.7 cm. Right hilar node measures 2 cm, image 33/2.  Previously 1.3 cm. Lungs/Pleura: Similar small to moderate right pleural effusion. Index posteromedial right lower lobe lung nodule measures  1 cm, image 80/6. Previously 0.9 cm. Index superior segment of left lower lobe lung nodule measures 1.9 cm, image 44/6. Unchanged. Index left upper lobe  lung nodule measures 0.6 cm, image 69/6. Previously 0.5 cm. Index right upper lobe lung nodule measures 0.6 cm, image 44/6. Previously 0.7 cm. Index right middle lobe perifissural nodule measures 1 cm, image 91/6. Previously 0.8 cm. Index perifissural right lower lobe lung nodule measures 0.8 cm, image 112/6. Previously 1 cm. Musculoskeletal: No chest wall mass or suspicious bone lesions identified. CT ABDOMEN PELVIS FINDINGS Hepatobiliary: Index lesion within segment 6 measures 4.2 cm, image 72/2. Previously 3.2 cm. New, tiny nonspecific hypodensity along the dome measures 7 mm. Gallbladder normal. No biliary dilatation. Pancreas: Unremarkable. No pancreatic ductal dilatation or surrounding inflammatory changes. Spleen: The spleen is enlarged measuring 15.3 cm in cranial caudal dimension, unchanged. No focal splenic lesion. Adrenals/Urinary Tract: Normal adrenal glands. No kidney mass or hydronephrosis. Urinary bladder normal. Stomach/Bowel: The stomach is nondistended. Status post right hemicolectomy with entero colonic anastomosis. 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 Previously 4.0 x 4.8 cm. No evidence for high-grade bowel obstruction. Vascular/Lymphatic: Normal appearance of the abdominal aorta. Tumor encasement of the ileocolic branches of the superior mesenteric artery and vein identified. There is venous congestion identified within the associated ileocolic mesentery. Portal vein remains patent. Reproductive: Prostate is unremarkable. Other: New small volume ascites identified. Peritoneal implant at the level of the umbilicus measures 2.7 by 1.4 cm, image 84/2. Previously 2.0 x 1.3 cm. Musculoskeletal: No acute or significant osseous findings. IMPRESSION: 1. There is been interval enlargement of segment 6 liver metastases. Additionally mesenteric tumor at the enterocolonic anastomosis is mildly increased in size from previous exam. Peritoneal deposit along at the  umbilicus is mildly increased in size in the interval. New ascites. Findings are concerning for mild progression of disease within the abdomen and pelvis. 2. No significant change in diffuse pulmonary metastases. Stable volume of right pleural effusion. 3. Previous index mesenteric lymph nodes are mildly increased in size in the interval. 4. New small pericardial effusion. Electronically Signed   By: Kerby Moors M.D.   On: 11/13/2018 13:27   Dg Chest Port 1 View  Result Date: 11/24/2018 CLINICAL DATA:  Fever, cough, shortness of breath, nausea. EXAM: PORTABLE CHEST 1 VIEW COMPARISON:  November 05, 2018 FINDINGS: Left subclavian approach injectable port in stable position. Cardiomediastinal silhouette is mildly enlarged. Mediastinal contours appear intact. The left lung is clear. Right cardiophrenic airspace opacity and probable right pleural effusion. Previously noted by CT small pulmonary nodules are not seen radiographically. Osseous structures are without acute abnormality. Soft tissues are grossly normal. IMPRESSION: Right cardiophrenic airspace opacity and probable right pleural effusion. Electronically Signed   By: Fidela Salisbury M.D.   On: 11/24/2018 12:19   US Thoracentesis Asp Pleural Space W/img Guide  Result Date: 11/25/2018 INDICATION: Patient with history of shortness of breath, pneumonia, right pleural effusion. Request made for diagnostic and therapeutic thoracentesis. EXAM: ULTRASOUND GUIDED RIGHT DIAGNOSTIC AND THERAPEUTIC THORACENTESIS MEDICATIONS: 10 mL 1% lidocaine COMPLICATIONS: None immediate. PROCEDURE: An ultrasound guided thoracentesis was thoroughly discussed with the patient and questions answered. The benefits, risks, alternatives and complications were also discussed. The patient understands and wishes to proceed with the procedure. Written consent was obtained. Ultrasound was performed to localize and mark an adequate pocket of fluid in the right chest. The area was then  prepped and draped in the normal sterile fashion. 1% Lidocaine  was used for local anesthesia. Under ultrasound guidance a 6 Fr Safe-T-Centesis catheter was introduced. Thoracentesis was performed. The catheter was removed and a dressing applied. FINDINGS: A total of approximately 1.3 liters of clear, yellow fluid was removed. Samples were sent to the laboratory as requested by the clinical team. IMPRESSION: Successful ultrasound guided diagnostic and therapeutic right thoracentesis yielding 1.3 liters of pleural fluid. Read by: Brynda Greathouse PA-C Electronically Signed   By: Markus Daft M.D.   On: 11/25/2018 14:33    ASSESSMENT AND PLAN: 1.  Metastatic colorectal cancer 2.  Pneumonia 3.  Sepsis secondary to #2, resolved 4.  Acute kidney injury 5.  Right pleural effusion 6.  Anemia secondary to iron deficiency, recent chemotherapy, and underlying malignancy 7.  Depression and anxiety 8.  Chronic pain 9.  Goals of care discussion  -Recently completed regorafenib and this has been stopped secondary to disease progression.  Dr. Truman Hayward from [facility] has contacted our office with an opportunity for a phase 1 clinical trial of a MEK inhibitor.  The patient is interested in this opportunity.  If the screening process for the trial takes a long time from his last treatment, the we may have him go ahead and start his Lonsurf.  However, we will continue to hold this for now while he is recovering from pneumonia and being evaluated for the phase 1 clinical trial. -Antibiotics per hospitalist.. -Renal function improving.  Continue to monitor. 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 -Stool for occult blood positive.  For upper endoscopy today.  Hemoglobin remains stable. No transfusion indicated..  -Continue venlafaxine and Lorazepam -Continue fentanyl patch and oxycodone -Per previous discussions, the patient understands the goal of care is palliative and his overall prognosis is poor. In the  past, he has expressed desire to be a DNR, but wishes to be a FULL CODE at this time.    LOS: 3 days   Mikey Bussing, DNP, AGPCNP-BC, AOCNP 11/27/18  Addendum  I have seen the patient, examined him. I agree with the assessment and and plan and have edited the notes.   EGD done in the afternoon and was normal and negative for bleeding source. He will be discharged home today. I will f/u with him next week, he will be screened for a phase 1 clinical trial at [facility] in the next week.   Truitt Merle  11/27/2018

## 2018-11-27 NOTE — H&P (Signed)
Patient here in endo to evaluate melena by egd PE. Blood pressure elevated and being treated by anethesia. Heart rrr. Lungs clear. Abdomen soft Imp melena.  Plan egd.

## 2018-11-27 NOTE — Anesthesia Preprocedure Evaluation (Addendum)
Anesthesia Evaluation  Patient identified by MRN, date of birth, ID band Patient awake    Reviewed: Allergy & Precautions, H&P , NPO status , Patient's Chart, lab work & pertinent test results  Airway Mallampati: II  TM Distance: >3 FB Neck ROM: Full    Dental no notable dental hx. (+) Teeth Intact, Dental Advisory Given   Pulmonary neg pulmonary ROS, former smoker,    Pulmonary exam normal breath sounds clear to auscultation       Cardiovascular hypertension, Pt. on medications  Rhythm:Regular Rate:Normal     Neuro/Psych Anxiety Depression negative neurological ROS     GI/Hepatic negative GI ROS, Neg liver ROS,   Endo/Other  negative endocrine ROS  Renal/GU negative Renal ROS  negative genitourinary   Musculoskeletal   Abdominal   Peds  Hematology  (+) Blood dyscrasia, anemia ,   Anesthesia Other Findings   Reproductive/Obstetrics negative OB ROS                            Anesthesia Physical Anesthesia Plan  ASA: II  Anesthesia Plan: MAC   Post-op Pain Management:    Induction: Intravenous  PONV Risk Score and Plan: 1 and Propofol infusion  Airway Management Planned: Nasal Cannula  Additional Equipment:   Intra-op Plan:   Post-operative Plan:   Informed Consent: I have reviewed the patients History and Physical, chart, labs and discussed the procedure including the risks, benefits and alternatives for the proposed anesthesia with the patient or authorized representative who has indicated his/her understanding and acceptance.     Dental advisory given  Plan Discussed with: CRNA  Anesthesia Plan Comments:        Anesthesia Quick Evaluation

## 2018-11-27 NOTE — Anesthesia Procedure Notes (Signed)
Procedure Name: MAC Date/Time: 11/27/2018 2:00 PM Performed by: West Pugh, CRNA Pre-anesthesia Checklist: Patient identified, Emergency Drugs available, Suction available, Patient being monitored and Timeout performed Patient Re-evaluated:Patient Re-evaluated prior to induction Oxygen Delivery Method: Simple face mask Preoxygenation: Pre-oxygenation with 100% oxygen Placement Confirmation: positive ETCO2 Dental Injury: Teeth and Oropharynx as per pre-operative assessment

## 2018-11-27 NOTE — Progress Notes (Signed)
Pt BP elevated, see VS in flow sheet. Fitzgerald MD aware and advises to give 5 mg Labetalol PRN every 10 minutes up to 20 mg. Will continue to monitor.

## 2018-11-27 NOTE — Progress Notes (Signed)
Pt BP continues to be elevated with a diastolic over 230, see documented VS. Fitzgerald MD made aware and advises to give 5 of hydralazine and proceed with EGD. CRNA aware as well.

## 2018-11-27 NOTE — Discharge Summary (Addendum)
Physician Discharge Summary  Undrea Shipes FGH:[id number] DOB: 1961-03-24 DOA: 11/24/2018  PCP: Curlene Labrum, MD  Admit date: 11/24/2018 Discharge date: 11/27/2018  Admitted From: Home Disposition: Home  Recommendations for Outpatient Follow-up:  1. Follow up with PCP in 1-2 weeks 2. Follow up with Medical Oncology [name] within 1 week 3. Have [name] refer to [facility] for Clinical Trial 4. Follow up with Gastroenterology within 1-2 weeks for Outpatient Colonoscopy  5. Please obtain CMP/CBC, Mag, Phos in one week 6. Please follow up on the following pending results:  Home Health: No Equipment/Devices: None    Discharge Condition: Stable CODE STATUS: FULL CODE  Diet recommendation: Heart Healthy Diet   Brief/Interim Summary: The patient 58 y/o male with metastatic colon cancer, hypertension, anxiety presented with shortness of breath and fever.  Patient was found to have an H CAP pneumonia and was treated with IV cefepime as IV vancomycin was discontinued given his MRSA PCR screening negative.  Has AKI improved with IV fluid hydration and sepsis resolved.  He also underwent a thoracentesis with improvement in his breathing.  His anemia was worked up and GI did an EGD on 715 which showed no explanation for his anemia.  They recommended colonoscopy patient elects to do this as an outpatient.  Dr. Narda Amber recommended follow-up within 1 to 2 weeks and Dr. Prudence Davidson also recommends follow-up and she will refer the patient to [facility] for clinical trial.  Upon discharge patient was ambulated and did not desaturate and maintain his O2 saturations.  His transfer patient to p.o. antibiotics with Levaquin and was deemed stable for discharge and will need to follow-up with PCP, medical oncology as well as gastroenterology in the outpatient setting.  Discharge Diagnoses:  Principal Problem:   Sepsis ([place]) Active Problems:   Metastatic colon cancer to liver ([place])   HCAP (healthcare-associated  pneumonia)   Pleural effusion  Nosocomial pneumonia associated with pleural effusion on the right-patient is responding to treatments.  Changed antibiotics to cefepime to cover strep, discontinued vancomycin as MRSA screening is negative.  Continue supportive therapy.  Received Xopenex breathing treatment and did not desaturate on amatory screen.  Will change IV cefepime to p.o. Levaquin for 4 more days for a total 7-day treatment.  Also underwent thoracentesis with improvement in his breathing  AKI - creatinine improving with IV fluid hydration.  Follow BMP as an outpatient   Sepsis - RESOVED.  Secondary to HCAP - resolved with treatments.   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  Following CBC closely.  Stool hemoccult positive. GI consulted.  EGD planned for 7/15 and showed no evidence of any bleeding.  Will need outpatient colonoscopy  Metastatic colon cancer to liver/lung -[name] consulting and will follow patient outpatient  Chronic pain - resumed home medications.   GERD - protonix ordered for GI protection and will resume home dose  Depression/anxiety - continue venlafaxine/lorazepam  Discharge Instructions  Discharge Instructions    Call MD for:  difficulty breathing, headache or visual disturbances   Complete by: As directed    Call MD for:  extreme fatigue   Complete by: As directed    Call MD for:  hives   Complete by: As directed    Call MD for:  persistant dizziness or light-headedness   Complete by: As directed    Call MD for:  persistant nausea and vomiting   Complete by: As directed    Call MD for:  redness, tenderness, or signs  of infection (pain, swelling, redness, odor or green/yellow discharge around incision site)   Complete by: As directed    Call MD for:  severe uncontrolled pain   Complete by: As directed    Call MD for:  temperature >100.4   Complete by: As directed    Diet - low sodium heart healthy   Complete by: As directed    Discharge  instructions   Complete by: As directed    You were cared for by a hospitalist during your hospital stay. If you have any questions about your discharge medications or the care you received while you were in the hospital after you are discharged, you can call the unit and ask to speak with the hospitalist on call if the hospitalist that took care of you is not available. Once you are discharged, your primary care physician will handle any further medical issues. Please note that NO REFILLS for any discharge medications will be authorized once you are discharged, as it is imperative that you return to your primary care physician (or establish a relationship with a primary care physician if you do not have one) for your aftercare needs so that they can reassess your need for medications and monitor your lab values.  Follow up with PCP, Medical Oncology, and Gastroenterology. Medical Oncology will be referring to [facility] for clinical Trial. Take all medications as prescribed. If symptoms change or worsen please return to the ED for evaluation   Increase activity slowly   Complete by: As directed      Allergies as of 11/27/2018      Reactions   Penicillins Other (See Comments)   UNSPECIFIED REACTION  Has patient had a PCN reaction causing immediate rash, facial/tongue/throat swelling, SOB or lightheadedness with hypotension: unknown Has patient had a PCN reaction causing severe rash involving mucus membranes or skin necrosis: unknown Has patient had a PCN reaction that required hospitalization unknown Has patient had a PCN reaction occurring within the last 10 years: No If all of the above answers are "NO", then may proceed with Cephalosporin use.   Xeloda [capecitabine] Nausea And Vomiting, Other (See Comments)   Foot pain, patient couldn't walk, and bleeding gums   Nivolumab Nausea And Vomiting, Palpitations, Other (See Comments)   Infusion reaction: Chills, rigors, numbness. Symptoms improved with  ondansetron, clonidine, famotidine, meperidine      Medication List    STOP taking these medications   celecoxib 200 MG capsule Commonly known as: CELEBREX   dronabinol 2.5 MG capsule Commonly known as: MARINOL   simethicone 80 MG chewable tablet Commonly known as: MYLICON   sulfamethoxazole-trimethoprim 800-160 MG tablet Commonly known as: BACTRIM DS     TAKE these medications   albuterol 108 (90 Base) MCG/ACT inhaler Commonly known as: VENTOLIN HFA Inhale 1-2 puffs into the lungs every 6 (six) hours as needed for wheezing or shortness of breath.   amLODipine 10 MG tablet Commonly known as: NORVASC Take 1 tablet (10 mg total) by mouth daily.   cyclobenzaprine 5 MG tablet Commonly known as: FLEXERIL Take 1 tablet (5 mg total) by mouth 3 (three) times daily as needed for muscle spasms. What changed: Another medication with the same name was removed. Continue taking this medication, and follow the directions you see here.   feeding supplement (ENSURE ENLIVE) Liqd Take 237 mLs by mouth 2 (two) times daily between meals.   fentaNYL 50 MCG/HR Commonly known as: Berkey 1 patch onto the skin every other day. What  changed: Another medication with the same name was removed. Continue taking this medication, and follow the directions you see here.   Flovent HFA 110 MCG/ACT inhaler Generic drug: fluticasone Inhale 2 puffs into the lungs 2 (two) times daily.   gabapentin 300 MG capsule Commonly known as: NEURONTIN TAKE ONE CAPSULE BY MOUTH THREE TIMES DAILY   levofloxacin 750 MG tablet Commonly known as: LEVAQUIN Take 1 tablet (750 mg total) by mouth daily. Start taking on: November 28, 2018   LORazepam 1 MG tablet Commonly known as: ATIVAN Take 1 tablet (1 mg total) by mouth every 4 (four) hours as needed for anxiety or sleep (nausea).   losartan 100 MG tablet Commonly known as: COZAAR TAKE ONE TABLET BY MOUTH EVERY DAY   multivitamin with minerals Tabs  tablet Take 1 tablet by mouth daily.   omeprazole 20 MG capsule Commonly known as: PRILOSEC TAKE ONE CAPSULE BY MOUTH TWICE DAILY BEFORE A MEAL What changed: See the new instructions.   ondansetron 4 MG tablet Commonly known as: ZOFRAN Take 1 tablet (4 mg total) by mouth every 6 (six) hours as needed for nausea.   Oxycodone HCl 10 MG Tabs Take 1-2 tablets (10-20 mg total) by mouth every 6 (six) hours as needed. What changed: reasons to take this   polyethylene glycol 17 g packet Commonly known as: MIRALAX / GLYCOLAX Take 17 g by mouth daily. What changed:   when to take this  reasons to take this   prochlorperazine 10 MG tablet Commonly known as: COMPAZINE Take 1 tablet (10 mg total) by mouth every 6 (six) hours as needed for nausea or vomiting.   senna-docusate 8.6-50 MG tablet Commonly known as: Senokot-S Take 2 tablets by mouth 2 (two) times daily. What changed:   when to take this  reasons to take this   sildenafil 20 MG tablet Commonly known as: REVATIO Take 60-100 mg by mouth See admin instructions. Take 60-100 mg by mouth at least 30 minutes prior to sexual activity   trifluridine-tipiracil 20-8.19 MG tablet Commonly known as: LONSURF Take 4 tabs (80mg  trifluridine) PO in AM & 3 tabs (60mg  trifluridine) in PM, immediately after food on days 1-5 & 8-12 of each 28d cycle   venlafaxine XR 150 MG 24 hr capsule Commonly known as: EFFEXOR-XR TAKE ONE CAPSULE BY MOUTH DAILY WITH BREAKFAST What changed: See the new instructions.      Follow-up Information    Burdine, Virgina Evener, MD. Call.   Specialty: Family Medicine Why: Follow up within 1 week  Contact information: [place] [address] [phone]        Truitt Merle, MD. Call.   Specialties: Hematology, Oncology Why: Follow up within 1 week Contact information: [place] [address] [phone]        Wonda Horner, MD. Call.   Specialty:  Gastroenterology Why: Follow up for Outpatient Colonoscopy  Contact information: [address] [place] [address] [phone]          Allergies  Allergen Reactions  . Penicillins Other (See Comments)    UNSPECIFIED REACTION   Has patient had a PCN reaction causing immediate rash, facial/tongue/throat swelling, SOB or lightheadedness with hypotension: unknown Has patient had a PCN reaction causing severe rash involving mucus membranes or skin necrosis: unknown Has patient had a PCN reaction that required hospitalization unknown Has patient had a PCN reaction occurring within the last 10 years: No If all of the  above answers are "NO", then may proceed with Cephalosporin use.   [name] [Capecitabine] Nausea And Vomiting and Other (See Comments)    Foot pain, patient couldn't walk, and bleeding gums  . Nivolumab Nausea And Vomiting, Palpitations and Other (See Comments)    Infusion reaction: Chills, rigors, numbness. Symptoms improved with ondansetron, clonidine, famotidine, meperidine   Consultations:  Interventional Radiology  Medical Oncology  Gastroenterology   Procedures/Studies: Dg Chest 1 View  Result Date: 11/25/2018 CLINICAL DATA:  Status post RIGHT thoracentesis. EXAM: CHEST  1 VIEW COMPARISON:  None. FINDINGS: The RIGHT pleural effusion has decreased in size. Mild RIGHT basilar atelectasis persists. There is no evidence of pneumothorax. A LEFT Port-A-Cath is again noted. The LEFT lung is clear. IMPRESSION: Decreased RIGHT pleural effusion with mild persistent RIGHT basilar atelectasis. No pneumothorax. Electronically Signed   By: Margarette Canada M.D.   On: 11/25/2018 14:07   Dg Chest 2 View  Result Date: 11/05/2018 CLINICAL DATA:  History of metastatic colon cancer. Cough for 2-3 months. EXAM: CHEST - 2 VIEW COMPARISON:  CT chest 09/02/2018.  PA and lateral chest 02/24/2017. FINDINGS: The patient has a small right pleural effusion and basilar  atelectasis. Scattered pulmonary nodules are identified, most prominent in the right lower lobe. No pneumothorax. No consolidative process. Heart size is normal. No acute or focal bony abnormality. IMPRESSION: Small right pleural effusion and pulmonary nodules. The appearance is not markedly changed compared to the prior chest CT. Electronically Signed   By: Inge Rise M.D.   On: 11/05/2018 08:58   Ct Chest W Contrast  Result Date: 11/13/2018 CLINICAL DATA:  Restaging metastatic colon cancer EXAM: CT CHEST, ABDOMEN, AND PELVIS WITH CONTRAST TECHNIQUE: Multidetector CT imaging of the chest, abdomen and pelvis was performed following the standard protocol during bolus administration of intravenous contrast. CONTRAST:  164mL OMNIPAQUE IOHEXOL 300 MG/ML  SOLN COMPARISON:  CT chest 09/02/2018 and CT AP 08/29/2018 FINDINGS: CT CHEST FINDINGS Cardiovascular: The heart size appears normal. Small pericardial effusion, new. Lad coronary artery calcifications. Mediastinum/Nodes: Normal appearance of the thyroid gland. The trachea appears patent and is midline. Normal appearance of the esophagus. -index right paratracheal lymph node measures 1.2 cm, image 14/2. Previously 1.0 cm. Index right paratracheal lymph node measures 1 cm, image 16/2. Previously 0.9 cm. Index right pre-vascular node measures 0.9 cm, image 23/2. Previously 0.7 cm. Right hilar node measures 2 cm, image 33/2.  Previously 1.3 cm. Lungs/Pleura: Similar small to moderate right pleural effusion. Index posteromedial right lower lobe lung nodule measures 1 cm, image 80/6. Previously 0.9 cm. Index superior segment of left lower lobe lung nodule measures 1.9 cm, image 44/6. Unchanged. Index left upper lobe lung nodule measures 0.6 cm, image 69/6. Previously 0.5 cm. Index right upper lobe lung nodule measures 0.6 cm, image 44/6. Previously 0.7 cm. Index right middle lobe perifissural nodule measures 1 cm, image 91/6. Previously 0.8 cm. Index perifissural  right lower lobe lung nodule measures 0.8 cm, image 112/6. Previously 1 cm. Musculoskeletal: No chest wall mass or suspicious bone lesions identified. CT ABDOMEN PELVIS FINDINGS Hepatobiliary: Index lesion within segment 6 measures 4.2 cm, image 72/2. Previously 3.2 cm. New, tiny nonspecific hypodensity along the dome measures 7 mm. Gallbladder normal. No biliary dilatation. Pancreas: Unremarkable. No pancreatic ductal dilatation or surrounding inflammatory changes. Spleen: The spleen is enlarged measuring 15.3 cm in cranial caudal dimension, unchanged. No focal splenic lesion. Adrenals/Urinary Tract: Normal adrenal glands. No kidney mass or hydronephrosis. Urinary bladder normal. Stomach/Bowel: The stomach  is nondistended. Status post right hemicolectomy with entero colonic anastomosis. 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 Previously 4.0 x 4.8 cm. No evidence for high-grade bowel obstruction. Vascular/Lymphatic: Normal appearance of the abdominal aorta. Tumor encasement of the ileocolic branches of the superior mesenteric artery and vein identified. There is venous congestion identified within the associated ileocolic mesentery. Portal vein remains patent. Reproductive: Prostate is unremarkable. Other: New small volume ascites identified. Peritoneal implant at the level of the umbilicus measures 2.7 by 1.4 cm, image 84/2. Previously 2.0 x 1.3 cm. Musculoskeletal: No acute or significant osseous findings. IMPRESSION: 1. There is been interval enlargement of segment 6 liver metastases. Additionally mesenteric tumor at the enterocolonic anastomosis is mildly increased in size from previous exam. Peritoneal deposit along at the umbilicus is mildly increased in size in the interval. New ascites. Findings are concerning for mild progression of disease within the abdomen and pelvis. 2. No significant change in diffuse pulmonary metastases. Stable volume of right pleural effusion.  3. Previous index mesenteric lymph nodes are mildly increased in size in the interval. 4. New small pericardial effusion. Electronically Signed   By: Kerby Moors M.D.   On: 11/13/2018 13:27   Ct Angio Chest Pe W Or Wo Contrast  Result Date: 11/24/2018 CLINICAL DATA:  History of metastatic colon carcinoma with fever and abdominal pain EXAM: CT ANGIOGRAPHY CHEST CT ABDOMEN AND PELVIS WITH CONTRAST TECHNIQUE: Multidetector CT imaging of the chest was performed using the standard protocol during bolus administration of intravenous contrast. Multiplanar CT image reconstructions and MIPs were obtained to evaluate the vascular anatomy. Multidetector CT imaging of the abdomen and pelvis was performed using the standard protocol during bolus administration of intravenous contrast. CONTRAST:  3mL OMNIPAQUE IOHEXOL 350 MG/ML SOLN COMPARISON:  Similar exam dated 11/13/2018 FINDINGS: CTA CHEST FINDINGS Cardiovascular: Thoracic aorta demonstrates a normal branching pattern. No aneurysmal dilatation or dissection is seen. The heart is normal in size and configuration. The pulmonary artery shows a normal branching pattern. No definitive filling defect to suggest pulmonary embolism is noted. Mediastinum/Nodes: Thoracic inlet is within normal limits. The esophagus is within normal limits. Scattered mediastinal lymph nodes are again identified similar to that seen on the recent restaging examination from 11 days previous. Stable right hilar lymph node is noted as well. Lungs/Pleura: The lungs are well aerated bilaterally. Nodules are again identified bilaterally. The overall appearance is stable from the prior exam from 11 days previous. An enlarging right-sided pleural effusion is seen. New superimposed right lower lobe and right middle lobe infiltrate is seen. Musculoskeletal: Degenerative changes of the thoracic spine are noted. No metastatic lesions are seen. Left-sided chest wall port is noted Review of the MIP images  confirms the above findings. CT ABDOMEN and PELVIS FINDINGS Hepatobiliary: Liver again demonstrates a hypodense lesion within the right lobe consistent with metastatic disease measuring 4.2 cm in greatest dimension. Small hypodensity in the dome of the liver is noted also stable from the prior study. Gallbladder demonstrates vicarious excretion of contrast. No ductal dilatation is seen. Pancreas: Unremarkable. No pancreatic ductal dilatation or surrounding inflammatory changes. Spleen: Normal in size without focal abnormality. Adrenals/Urinary Tract: Adrenal glands are within normal limits. Kidneys demonstrate no renal calculi or obstructive changes bilaterally. Normal excretion of contrast is seen. The bladder is decompressed. Stomach/Bowel: Colon is well visualized. Postsurgical changes are noted in the mid transverse colon consistent with the prior surgical history. There is again noted soft tissue mass in the  mesentery adjacent to the anastomotic site best seen in the coronal plane on image number 40 of series 4. Just proximal to the small bowel colon anastomotic site there is some mild dilatation of the distal small bowel identified. Multiple edematous loops of small bowel are noted proximal to the anastomotic site. A mild partial small bowel obstruction at the anastomotic site cannot be totally excluded. Vascular/Lymphatic: No aneurysmal dilatation is seen. The encasement of the superior mesenteric artery and vein by the adjacent mesenteric mass is less well visualized on today's exam due to the timing of the contrast bolus. There does remain however venous engorgement of the small bowel contributing to the degree of small bowel wall edema and likely ascites. Reproductive: Prostate is unremarkable. Other: Increase in ascites is noted both in the pelvis as well as surrounding the bowel loops and liver. Peritoneal implant is again noted near the umbilicus. Musculoskeletal: Degenerative changes of lumbar spine  are seen. Review of the MIP images confirms the above findings. IMPRESSION: CTA of the chest: No evidence of pulmonary emboli are identified. Stable right hilar and mediastinal adenopathy is seen. Stable metastatic lesions within both lungs. Increasing right-sided pleural effusion with associated right lower and middle lobe infiltrates CT of the abdomen and pelvis: Postsurgical changes consistent with the given clinical history. There remains a mesenteric mass near the anastomotic site similar to that seen on the recent exam. Stable appearing hepatic metastatic disease is noted. Persistent venous engorgement is noted related to vascular encasement with new small bowel edematous changes identified. Some mild dilatation of the distal small bowel proximal to the anastomosis is seen. The possibility of mild narrowing at the anastomosis deserves consideration. This was not present on the most recent exam. This may be accentuated by the venous engorgement and small-bowel wall edema. Increasing ascites. There are changes consistent with peritoneal implantation stable from the prior exam. Electronically Signed   By: Inez Catalina M.D.   On: 11/24/2018 18:13   Ct Abdomen Pelvis W Contrast  Result Date: 11/24/2018 CLINICAL DATA:  History of metastatic colon carcinoma with fever and abdominal pain EXAM: CT ANGIOGRAPHY CHEST CT ABDOMEN AND PELVIS WITH CONTRAST TECHNIQUE: Multidetector CT imaging of the chest was performed using the standard protocol during bolus administration of intravenous contrast. Multiplanar CT image reconstructions and MIPs were obtained to evaluate the vascular anatomy. Multidetector CT imaging of the abdomen and pelvis was performed using the standard protocol during bolus administration of intravenous contrast. CONTRAST:  41mL OMNIPAQUE IOHEXOL 350 MG/ML SOLN COMPARISON:  Similar exam dated 11/13/2018 FINDINGS: CTA CHEST FINDINGS Cardiovascular: Thoracic aorta demonstrates a normal branching  pattern. No aneurysmal dilatation or dissection is seen. The heart is normal in size and configuration. The pulmonary artery shows a normal branching pattern. No definitive filling defect to suggest pulmonary embolism is noted. Mediastinum/Nodes: Thoracic inlet is within normal limits. The esophagus is within normal limits. Scattered mediastinal lymph nodes are again identified similar to that seen on the recent restaging examination from 11 days previous. Stable right hilar lymph node is noted as well. Lungs/Pleura: The lungs are well aerated bilaterally. Nodules are again identified bilaterally. The overall appearance is stable from the prior exam from 11 days previous. An enlarging right-sided pleural effusion is seen. New superimposed right lower lobe and right middle lobe infiltrate is seen. Musculoskeletal: Degenerative changes of the thoracic spine are noted. No metastatic lesions are seen. Left-sided chest wall port is noted Review of the MIP images confirms the above findings. CT ABDOMEN  and PELVIS FINDINGS Hepatobiliary: Liver again demonstrates a hypodense lesion within the right lobe consistent with metastatic disease measuring 4.2 cm in greatest dimension. Small hypodensity in the dome of the liver is noted also stable from the prior study. Gallbladder demonstrates vicarious excretion of contrast. No ductal dilatation is seen. Pancreas: Unremarkable. No pancreatic ductal dilatation or surrounding inflammatory changes. Spleen: Normal in size without focal abnormality. Adrenals/Urinary Tract: Adrenal glands are within normal limits. Kidneys demonstrate no renal calculi or obstructive changes bilaterally. Normal excretion of contrast is seen. The bladder is decompressed. Stomach/Bowel: Colon is well visualized. Postsurgical changes are noted in the mid transverse colon consistent with the prior surgical history. There is again noted soft tissue mass in the mesentery adjacent to the anastomotic site best  seen in the coronal plane on image number 40 of series 4. Just proximal to the small bowel colon anastomotic site there is some mild dilatation of the distal small bowel identified. Multiple edematous loops of small bowel are noted proximal to the anastomotic site. A mild partial small bowel obstruction at the anastomotic site cannot be totally excluded. Vascular/Lymphatic: No aneurysmal dilatation is seen. The encasement of the superior mesenteric artery and vein by the adjacent mesenteric mass is less well visualized on today's exam due to the timing of the contrast bolus. There does remain however venous engorgement of the small bowel contributing to the degree of small bowel wall edema and likely ascites. Reproductive: Prostate is unremarkable. Other: Increase in ascites is noted both in the pelvis as well as surrounding the bowel loops and liver. Peritoneal implant is again noted near the umbilicus. Musculoskeletal: Degenerative changes of lumbar spine are seen. Review of the MIP images confirms the above findings. IMPRESSION: CTA of the chest: No evidence of pulmonary emboli are identified. Stable right hilar and mediastinal adenopathy is seen. Stable metastatic lesions within both lungs. Increasing right-sided pleural effusion with associated right lower and middle lobe infiltrates CT of the abdomen and pelvis: Postsurgical changes consistent with the given clinical history. There remains a mesenteric mass near the anastomotic site similar to that seen on the recent exam. Stable appearing hepatic metastatic disease is noted. Persistent venous engorgement is noted related to vascular encasement with new small bowel edematous changes identified. Some mild dilatation of the distal small bowel proximal to the anastomosis is seen. The possibility of mild narrowing at the anastomosis deserves consideration. This was not present on the most recent exam. This may be accentuated by the venous engorgement and  small-bowel wall edema. Increasing ascites. There are changes consistent with peritoneal implantation stable from the prior exam. Electronically Signed   By: Inez Catalina M.D.   On: 11/24/2018 18:13   Ct Abdomen Pelvis W Contrast  Result Date: 11/13/2018 CLINICAL DATA:  Restaging metastatic colon cancer EXAM: CT CHEST, ABDOMEN, AND PELVIS WITH CONTRAST TECHNIQUE: Multidetector CT imaging of the chest, abdomen and pelvis was performed following the standard protocol during bolus administration of intravenous contrast. CONTRAST:  134mL OMNIPAQUE IOHEXOL 300 MG/ML  SOLN COMPARISON:  CT chest 09/02/2018 and CT AP 08/29/2018 FINDINGS: CT CHEST FINDINGS Cardiovascular: The heart size appears normal. Small pericardial effusion, new. Lad coronary artery calcifications. Mediastinum/Nodes: Normal appearance of the thyroid gland. The trachea appears patent and is midline. Normal appearance of the esophagus. -index right paratracheal lymph node measures 1.2 cm, image 14/2. Previously 1.0 cm. Index right paratracheal lymph node measures 1 cm, image 16/2. Previously 0.9 cm. Index right pre-vascular node measures 0.9 cm, image 23/2.  Previously 0.7 cm. Right hilar node measures 2 cm, image 33/2.  Previously 1.3 cm. Lungs/Pleura: Similar small to moderate right pleural effusion. Index posteromedial right lower lobe lung nodule measures 1 cm, image 80/6. Previously 0.9 cm. Index superior segment of left lower lobe lung nodule measures 1.9 cm, image 44/6. Unchanged. Index left upper lobe lung nodule measures 0.6 cm, image 69/6. Previously 0.5 cm. Index right upper lobe lung nodule measures 0.6 cm, image 44/6. Previously 0.7 cm. Index right middle lobe perifissural nodule measures 1 cm, image 91/6. Previously 0.8 cm. Index perifissural right lower lobe lung nodule measures 0.8 cm, image 112/6. Previously 1 cm. Musculoskeletal: No chest wall mass or suspicious bone lesions identified. CT ABDOMEN PELVIS FINDINGS Hepatobiliary: Index  lesion within segment 6 measures 4.2 cm, image 72/2. Previously 3.2 cm. New, tiny nonspecific hypodensity along the dome measures 7 mm. Gallbladder normal. No biliary dilatation. Pancreas: Unremarkable. No pancreatic ductal dilatation or surrounding inflammatory changes. Spleen: The spleen is enlarged measuring 15.3 cm in cranial caudal dimension, unchanged. No focal splenic lesion. Adrenals/Urinary Tract: Normal adrenal glands. No kidney mass or hydronephrosis. Urinary bladder normal. Stomach/Bowel: The stomach is nondistended. Status post right hemicolectomy with entero colonic anastomosis. 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 Previously 4.0 x 4.8 cm. No evidence for high-grade bowel obstruction. Vascular/Lymphatic: Normal appearance of the abdominal aorta. Tumor encasement of the ileocolic branches of the superior mesenteric artery and vein identified. There is venous congestion identified within the associated ileocolic mesentery. Portal vein remains patent. Reproductive: Prostate is unremarkable. Other: New small volume ascites identified. Peritoneal implant at the level of the umbilicus measures 2.7 by 1.4 cm, image 84/2. Previously 2.0 x 1.3 cm. Musculoskeletal: No acute or significant osseous findings. IMPRESSION: 1. There is been interval enlargement of segment 6 liver metastases. Additionally mesenteric tumor at the enterocolonic anastomosis is mildly increased in size from previous exam. Peritoneal deposit along at the umbilicus is mildly increased in size in the interval. New ascites. Findings are concerning for mild progression of disease within the abdomen and pelvis. 2. No significant change in diffuse pulmonary metastases. Stable volume of right pleural effusion. 3. Previous index mesenteric lymph nodes are mildly increased in size in the interval. 4. New small pericardial effusion. Electronically Signed   By: Kerby Moors M.D.   On: 11/13/2018 13:27   Dg  Chest Port 1 View  Result Date: 11/24/2018 CLINICAL DATA:  Fever, cough, shortness of breath, nausea. EXAM: PORTABLE CHEST 1 VIEW COMPARISON:  November 05, 2018 FINDINGS: Left subclavian approach injectable port in stable position. Cardiomediastinal silhouette is mildly enlarged. Mediastinal contours appear intact. The left lung is clear. Right cardiophrenic airspace opacity and probable right pleural effusion. Previously noted by CT small pulmonary nodules are not seen radiographically. Osseous structures are without acute abnormality. Soft tissues are grossly normal. IMPRESSION: Right cardiophrenic airspace opacity and probable right pleural effusion. Electronically Signed   By: Fidela Salisbury M.D.   On: 11/24/2018 12:19   US Thoracentesis Asp Pleural Space W/img Guide  Result Date: 11/25/2018 INDICATION: Patient with history of shortness of breath, pneumonia, right pleural effusion. Request made for diagnostic and therapeutic thoracentesis. EXAM: ULTRASOUND GUIDED RIGHT DIAGNOSTIC AND THERAPEUTIC THORACENTESIS MEDICATIONS: 10 mL 1% lidocaine COMPLICATIONS: None immediate. PROCEDURE: An ultrasound guided thoracentesis was thoroughly discussed with the patient and questions answered. The benefits, risks, alternatives and complications were also discussed. The patient understands and wishes to proceed with the procedure. Written consent was obtained.  Ultrasound was performed to localize and mark an adequate pocket of fluid in the right chest. The area was then prepped and draped in the normal sterile fashion. 1% Lidocaine was used for local anesthesia. Under ultrasound guidance a 6 Fr Safe-T-Centesis catheter was introduced. Thoracentesis was performed. The catheter was removed and a dressing applied. FINDINGS: A total of approximately 1.3 liters of clear, yellow fluid was removed. Samples were sent to the laboratory as requested by the clinical team. IMPRESSION: Successful ultrasound guided diagnostic and  therapeutic right thoracentesis yielding 1.3 liters of pleural fluid. Read by: Brynda Greathouse PA-C Electronically Signed   By: Markus Daft M.D.   On: 11/25/2018 14:33   EGD 11/27/2018  Findings:      The examined esophagus was normal.      The entire examined stomach was normal.      The examined duodenum was normal. Impression:               - Normal esophagus.                           - Normal stomach.                           - Normal examined duodenum.                           - No specimens collected. Moderate Sedation:      . Recommendation:           - Resume regular diet.                           - Next step would be to do a colonoscopy to                            evaluate melena. He has metastatic colon cancer and                            is under the care of Dr Burr Medico from oncology. He                            wants to go home and this could be done as an                            outpatient.  Subjective: Seen and examined prior to EGD and he was feeling better.  Denies chest pain, lightheadedness or dizziness.  Wanted to go home.  EGD showed no evidence of bleeding and patient was deemed stable for discharge and he is understandable agreeable with the plan of care and will follow with PCP, gastroenterology, as well as medical oncology and will have medical oncology refer the patient to [facility] for clinical trial.  Discharge Exam: Vitals:   11/27/18 1435 11/27/18 1454  BP: (!) 158/103 (!) 164/114  Pulse: 84 83  Resp:  18  Temp:  98.4 F (36.9 C)  SpO2: 97% 96%   Vitals:   11/27/18 1355 11/27/18 1423 11/27/18 1435 11/27/18 1454  BP: (!) 158/98 128/77 (!) 158/103 (!) 164/114  Pulse: 88 83 84 83  Resp: 16 18  18  Temp:  98.5 F (36.9 C)  98.4 F (36.9 C)  TempSrc:  Temporal  Oral  SpO2: 97% 93% 97% 96%  Weight:      Height:       General: Pt is alert, awake, not in acute distress Cardiovascular: Mildly tachycardic, S1/S2 +, no rubs, no gallops Respiratory:  Diminished bilaterally, no wheezing, no rhonchi Abdominal: Soft, NT, ND, bowel sounds + Extremities: no edema, no cyanosis  The results of significant diagnostics from this hospitalization (including imaging, microbiology, ancillary and laboratory) are listed below for reference.    Microbiology: Recent Results (from the past 240 hour(s))  Novel Coronavirus, NAA ([facility])     Status: None   Collection Time: 11/19/18  8:24 AM  Result Value Ref Range Status   SARS-CoV-2, NAA Not Detected Not Detected Final    Comment: Testing was performed using the cobas(R) SARS-CoV-2 test. This test was developed and its performance characteristics determined by [employer]. This test has not been FDA cleared or approved. This test has been authorized by FDA under an Emergency Use Authorization (EUA). This test is only authorized for the duration of time the declaration that circumstances exist justifying the authorization of the emergency use of in vitro diagnostic tests for detection of SARS-CoV-2 virus and/or diagnosis of COVID-19 infection under section 564(b)(1) of the Act, 21 U.S.C. 672CNO-7(S)(9), unless the authorization is terminated or revoked sooner. When diagnostic testing is negative, the possibility of a false negative result should be considered in the context of a patient's recent exposures and the presence of clinical signs and symptoms consistent with COVID-19. An individual without symptoms of COVID-19 and who is not shedding SARS-CoV-2 virus would expect to have a negati ve (not detected) result in this assay.   Blood Culture (routine x 2)     Status: None (Preliminary result)   Collection Time: 11/24/18 11:33 AM   Specimen: BLOOD  Result Value Ref Range Status   Specimen Description   Final    BLOOD SITE NOT SPECIFIED Performed at [facility], [address], [place], [place] [address]    Special Requests   Final    BOTTLES DRAWN AEROBIC AND ANAEROBIC Blood  Culture adequate volume Performed at [facility] [address], [place], [place] [address]    Culture   Final    NO GROWTH 3 DAYS Performed at [facility], [place] [address], [place], [place] [address]    Report Status PENDING  Incomplete  Blood Culture (routine x 2)     Status: None (Preliminary result)   Collection Time: 11/24/18 12:25 PM   Specimen: BLOOD  Result Value Ref Range Status   Specimen Description   Final    BLOOD PORT Performed at [facility] [address] [place], [place] [address]    Special Requests   Final    BOTTLES DRAWN AEROBIC ONLY Blood Culture results may not be optimal due to an excessive volume of blood received in culture bottles Performed at [facility] [address], [place], [place] [address]    Culture   Final    NO GROWTH 3 DAYS Performed at [facility], [place] [address] [place], [place] [address]    Report Status PENDING  Incomplete  SARS Coronavirus 2 (CEPHEID- Performed in [facility] hospital lab), Hosp Order     Status: None   Collection Time: 11/24/18 12:53 PM   Specimen: Nasopharyngeal Swab  Result Value Ref  Range Status   SARS Coronavirus 2 NEGATIVE NEGATIVE Final    Comment: (NOTE) If result is NEGATIVE SARS-CoV-2 target nucleic acids are NOT DETECTED. The SARS-CoV-2 RNA is generally detectable in upper and lower  respiratory specimens during the acute phase of infection. The lowest  concentration of SARS-CoV-2 viral copies this assay can detect is 250  copies / mL. A negative result does not preclude SARS-CoV-2 infection  and should not be used as the sole basis for treatment or other  patient management decisions.  A negative result may occur with  improper specimen collection / handling, submission of specimen other  than nasopharyngeal swab, presence of viral mutation(s) within the  areas targeted by this assay, and inadequate number of viral copies   (<250 copies / mL). A negative result must be combined with clinical  observations, patient history, and epidemiological information. If result is POSITIVE SARS-CoV-2 target nucleic acids are DETECTED. The SARS-CoV-2 RNA is generally detectable in upper and lower  respiratory specimens dur ing the acute phase of infection.  Positive  results are indicative of active infection with SARS-CoV-2.  Clinical  correlation with patient history and other diagnostic information is  necessary to determine patient infection status.  Positive results do  not rule out bacterial infection or co-infection with other viruses. If result is PRESUMPTIVE POSTIVE SARS-CoV-2 nucleic acids MAY BE PRESENT.   A presumptive positive result was obtained on the submitted specimen  and confirmed on repeat testing.  While 2019 novel coronavirus  (SARS-CoV-2) nucleic acids may be present in the submitted sample  additional confirmatory testing may be necessary for epidemiological  and / or clinical management purposes  to differentiate between  SARS-CoV-2 and other Sarbecovirus currently known to infect humans.  If clinically indicated additional testing with an alternate test  methodology [phone]) is advised. The SARS-CoV-2 RNA is generally  detectable in upper and lower respiratory sp ecimens during the acute  phase of infection. The expected result is Negative. Fact Sheet for Patients:  StrictlyIdeas.no Fact Sheet for Healthcare Providers: BankingDealers.co.za This test is not yet approved or cleared by the [place] FDA and has been authorized for detection and/or diagnosis of SARS-CoV-2 by FDA under an Emergency Use Authorization (EUA).  This EUA will remain in effect (meaning this test can be used) for the duration of the COVID-19 declaration under Section 564(b)(1) of the Act, 21 U.S.C. section 360bbb-3(b)(1), unless the authorization is terminated  or revoked sooner. Performed at [facility], [place] [address] [place], [place] [address]   Urine culture     Status: None   Collection Time: 11/24/18  5:00 PM   Specimen: In/Out Cath Urine  Result Value Ref Range Status   Specimen Description   Final    IN/OUT CATH URINE Performed at [facility] [address], [place], [place] [address]    Special Requests   Final    NONE Performed at [facility], [place] [address], [place], [place] [address]    Culture   Final    NO GROWTH Performed at [facility], [place] [address] [place], [place] [address]    Report Status 11/25/2018 FINAL  Final  MRSA PCR Screening     Status: None   Collection Time: 11/24/18  6:53 PM   Specimen: Nasopharyngeal  Result Value Ref Range Status   MRSA by PCR NEGATIVE NEGATIVE Final    Comment:        The GeneXpert MRSA  Assay (FDA approved for NASAL specimens only), is one component of a comprehensive MRSA colonization surveillance program. It is not intended to diagnose MRSA infection nor to guide or monitor treatment for MRSA infections. Performed at [facility], [place] [address], [place], [place] [address]   Culture, body fluid-bottle     Status: None (Preliminary result)   Collection Time: 11/25/18  2:07 PM   Specimen: Pleura  Result Value Ref Range Status   Specimen Description PLEURAL FLUID  Final   Special Requests   Final    BOTTLES DRAWN AEROBIC AND ANAEROBIC Blood Culture adequate volume   Culture   Final    NO GROWTH 2 DAYS Performed at [facility], [address] [place], [place] [address]    Report Status PENDING  Incomplete  Gram stain     Status: None   Collection Time: 11/25/18  2:07 PM   Specimen: Pleura  Result Value Ref Range Status   Specimen Description PLEURAL FLUID  Final   Special Requests NONE  Final   Gram Stain   Final    WBC PRESENT,BOTH PMN AND MONONUCLEAR NO ORGANISMS  SEEN CYTOSPIN SMEAR Performed at [facility], [address] [place], [place] [address]    Report Status 11/25/2018 FINAL  Final     Labs: BNP (last 3 results) No results for input(s): BNP in the last 8760 hours. Basic Metabolic Panel: Recent Labs  Lab 11/24/18 1200 11/24/18 1728 11/25/18 0248 11/26/18 0433 11/27/18 0320  NA 134* 133* 135 135 137  K 4.8 4.5 4.1 4.2 3.9  CL 107 107 109 109 111  CO2 19* 19* 18* 18* 19*  GLUCOSE 124* 109* 96 81 78  BUN 35* 32* 32* 23* 17  CREATININE 1.85* 1.79* 1.71* 1.35* 1.17  CALCIUM 8.3* 7.7* 7.6* 7.7* 7.9*   Liver Function Tests: Recent Labs  Lab 11/24/18 1200 11/24/18 1728 11/26/18 0433 11/27/18 0320  AST 27 28 24 27   ALT 22 22 20 21   ALKPHOS 70 67 56 53  BILITOT 0.6 0.5 0.2* 0.4  PROT 6.3* 5.8* 5.1* 5.2*  ALBUMIN 3.2* 3.0* 2.5* 2.6*   No results for input(s): LIPASE, AMYLASE in the last 168 hours. No results for input(s): AMMONIA in the last 168 hours. CBC: Recent Labs  Lab 11/24/18 1200 11/25/18 0248 11/26/18 0433 11/27/18 0320  WBC 4.3 2.9* 3.5* 3.6*  NEUTROABS 3.3  --   --   --   HGB 8.9* 8.4* 8.0* 7.9*  HCT 29.0* 27.6* 26.6* 26.6*  MCV 94.5 95.5 96.4 96.0  PLT 166 160 139* 130*   Cardiac Enzymes: No results for input(s): CKTOTAL, CKMB, CKMBINDEX, TROPONINI in the last 168 hours. BNP: Invalid input(s): POCBNP CBG: No results for input(s): GLUCAP in the last 168 hours. D-Dimer No results for input(s): DDIMER in the last 72 hours. Hgb A1c No results for input(s): HGBA1C in the last 72 hours. Lipid Profile No results for input(s): CHOL, HDL, LDLCALC, TRIG, CHOLHDL, LDLDIRECT in the last 72 hours. Thyroid function studies No results for input(s): TSH, T4TOTAL, T3FREE, THYROIDAB in the last 72 hours.  Invalid input(s): FREET3 Anemia work up No results for input(s): VITAMINB12, FOLATE, FERRITIN, TIBC, IRON, RETICCTPCT in the last 72 hours. Urinalysis    Component Value Date/Time   COLORURINE  YELLOW 11/24/2018 1700   APPEARANCEUR CLEAR 11/24/2018 1700   LABSPEC 1.014 11/24/2018 1700   PHURINE 5.0 11/24/2018 1700   GLUCOSEU NEGATIVE 11/24/2018 1700   HGBUR NEGATIVE 11/24/2018  [place] 11/24/2018 1700   KETONESUR NEGATIVE 11/24/2018 1700   PROTEINUR NEGATIVE 11/24/2018 1700   NITRITE NEGATIVE 11/24/2018 1700   LEUKOCYTESUR NEGATIVE 11/24/2018 1700   Sepsis Labs Invalid input(s): PROCALCITONIN,  WBC,  LACTICIDVEN Microbiology Recent Results (from the past 240 hour(s))  Novel Coronavirus, NAA ([facility])     Status: None   Collection Time: 11/19/18  8:24 AM  Result Value Ref Range Status   SARS-CoV-2, NAA Not Detected Not Detected Final    Comment: Testing was performed using the cobas(R) SARS-CoV-2 test. This test was developed and its performance characteristics determined by [employer]. This test has not been FDA cleared or approved. This test has been authorized by FDA under an Emergency Use Authorization (EUA). This test is only authorized for the duration of time the declaration that circumstances exist justifying the authorization of the emergency use of in vitro diagnostic tests for detection of SARS-CoV-2 virus and/or diagnosis of COVID-19 infection under section 564(b)(1) of the Act, 21 U.S.C. 967ELF-8(B)(0), unless the authorization is terminated or revoked sooner. When diagnostic testing is negative, the possibility of a false negative result should be considered in the context of a patient's recent exposures and the presence of clinical signs and symptoms consistent with COVID-19. An individual without symptoms of COVID-19 and who is not shedding SARS-CoV-2 virus would expect to have a negati ve (not detected) result in this assay.   Blood Culture (routine x 2)     Status: None (Preliminary result)   Collection Time: 11/24/18 11:33 AM   Specimen: BLOOD  Result Value Ref Range Status   Specimen Description   Final    BLOOD SITE  NOT SPECIFIED Performed at [facility], [address], [place], [place] [address]    Special Requests   Final    BOTTLES DRAWN AEROBIC AND ANAEROBIC Blood Culture adequate volume Performed at [facility] [address], [place], [place] [address]    Culture   Final    NO GROWTH 3 DAYS Performed at [facility], [place] [address], [place], [place] [address]    Report Status PENDING  Incomplete  Blood Culture (routine x 2)     Status: None (Preliminary result)   Collection Time: 11/24/18 12:25 PM   Specimen: BLOOD  Result Value Ref Range Status   Specimen Description   Final    BLOOD PORT Performed at [facility] [address] [place], [place] [address]    Special Requests   Final    BOTTLES DRAWN AEROBIC ONLY Blood Culture results may not be optimal due to an excessive volume of blood received in culture bottles Performed at [facility] [address], [place], [place] [address]    Culture   Final    NO GROWTH 3 DAYS Performed at [facility], [place] [address] [place], [place] [address]    Report Status PENDING  Incomplete  SARS Coronavirus 2 (CEPHEID- Performed in [facility] hospital lab), Hosp Order     Status: None   Collection Time: 11/24/18 12:53 PM   Specimen: Nasopharyngeal Swab  Result Value Ref Range Status   SARS Coronavirus 2 NEGATIVE NEGATIVE Final    Comment: (NOTE) If result is NEGATIVE SARS-CoV-2 target nucleic acids are NOT DETECTED. The SARS-CoV-2 RNA is generally detectable in upper and lower  respiratory specimens during the acute phase of infection. The lowest  concentration of SARS-CoV-2 viral copies this assay can  detect is 250  copies / mL. A negative result does not preclude SARS-CoV-2 infection  and should not be used as the sole basis for treatment or other  patient management decisions.  A negative result may occur with  improper specimen collection / handling,  submission of specimen other  than nasopharyngeal swab, presence of viral mutation(s) within the  areas targeted by this assay, and inadequate number of viral copies  (<250 copies / mL). A negative result must be combined with clinical  observations, patient history, and epidemiological information. If result is POSITIVE SARS-CoV-2 target nucleic acids are DETECTED. The SARS-CoV-2 RNA is generally detectable in upper and lower  respiratory specimens dur ing the acute phase of infection.  Positive  results are indicative of active infection with SARS-CoV-2.  Clinical  correlation with patient history and other diagnostic information is  necessary to determine patient infection status.  Positive results do  not rule out bacterial infection or co-infection with other viruses. If result is PRESUMPTIVE POSTIVE SARS-CoV-2 nucleic acids MAY BE PRESENT.   A presumptive positive result was obtained on the submitted specimen  and confirmed on repeat testing.  While 2019 novel coronavirus  (SARS-CoV-2) nucleic acids may be present in the submitted sample  additional confirmatory testing may be necessary for epidemiological  and / or clinical management purposes  to differentiate between  SARS-CoV-2 and other Sarbecovirus currently known to infect humans.  If clinically indicated additional testing with an alternate test  methodology [phone]) is advised. The SARS-CoV-2 RNA is generally  detectable in upper and lower respiratory sp ecimens during the acute  phase of infection. The expected result is Negative. Fact Sheet for Patients:  StrictlyIdeas.no Fact Sheet for Healthcare Providers: BankingDealers.co.za This test is not yet approved or cleared by the [place] FDA and has been authorized for detection and/or diagnosis of SARS-CoV-2 by FDA under an Emergency Use Authorization (EUA).  This EUA will remain in effect (meaning this test can be  used) for the duration of the COVID-19 declaration under Section 564(b)(1) of the Act, 21 U.S.C. section 360bbb-3(b)(1), unless the authorization is terminated or revoked sooner. Performed at [facility], [place] [address], [place], [place] [address]   Urine culture     Status: None   Collection Time: 11/24/18  5:00 PM   Specimen: In/Out Cath Urine  Result Value Ref Range Status   Specimen Description   Final    IN/OUT CATH URINE Performed at [place] [address], [place], [place] [address]    Special Requests   Final    NONE Performed at [facility], [place] [address], [place], [place] [address]    Culture   Final    NO GROWTH Performed at [facility], [place] [address] [place], [place] [address]    Report Status 11/25/2018 FINAL  Final  MRSA PCR Screening     Status: None   Collection Time: 11/24/18  6:53 PM   Specimen: Nasopharyngeal  Result Value Ref Range Status   MRSA by PCR NEGATIVE NEGATIVE Final    Comment:        The GeneXpert MRSA Assay (FDA approved for NASAL specimens only), is one component of a comprehensive MRSA colonization surveillance program. It is not intended to diagnose MRSA infection nor to guide or monitor treatment for MRSA infections. Performed at [facility], [place] [address], [place],  [address]   Culture, body fluid-bottle  Status: None (Preliminary result)   Collection Time: 11/25/18  2:07 PM   Specimen: Pleura  Result Value Ref Range Status   Specimen Description PLEURAL FLUID  Final   Special Requests   Final    BOTTLES DRAWN AEROBIC AND ANAEROBIC Blood Culture adequate volume   Culture   Final    NO GROWTH 2 DAYS Performed at [facility], [address] [place], [place] [address]    Report Status PENDING  Incomplete  Gram stain     Status: None   Collection Time: 11/25/18  2:07 PM   Specimen: Pleura  Result Value Ref Range  Status   Specimen Description PLEURAL FLUID  Final   Special Requests NONE  Final   Gram Stain   Final    WBC PRESENT,BOTH PMN AND MONONUCLEAR NO ORGANISMS SEEN CYTOSPIN SMEAR Performed at [facility], [address], [place], [place] [address]    Report Status 11/25/2018 FINAL  Final   Time coordinating discharge: 35 minutes  SIGNED:  Kerney Elbe, DO [facility] 11/27/2018, 7:54 PM Pager is on [place]  If 7PM-7AM, please contact night-coverage [email or web address] Password [email or web address]

## 2018-11-27 NOTE — Progress Notes (Signed)
   11/27/18 0900  Clinical Encounter Type  Visited With Patient  Visit Type Initial;Psychological support;Spiritual support  Referral From Nurse;Palliative care team  Consult/Referral To Chaplain  Spiritual Encounters  Spiritual Needs Prayer;Emotional;Other (Comment) (Spiritual Care Conversation/Support)  Stress Factors  Patient Stress Factors Health changes;Lack of knowledge;Major life changes;Other (Comment)   I spoke with [name] per [place] consult. We spoke at length about his spirituality and faith that "he will go to [place]" when he passes. [name] stated that he understands that he is "dying." He stated that he "wants to heal enough to get home with his family." [name] said that not being with his family is "debilitating to him mentally." He is missing his wife and children.  [name] said that he "craves" information about next steps in his care.  He also stated that he is "depressed" over not being able to see his wife of 30 years.  We did a life review and he wants time to spend with his family before he passes.  He requested prayer and I prayed with him.  I contacted the [facility] requesting permission for his wife to come visit for support and to help him make decisions about next steps in his care. I also briefed his nurse about what Mr. Kalter and I discussed, with his permission.   Please, contact Spiritual Care for further assistance.   Chaplain Shanon Ace M.Div., [facility]

## 2018-11-27 NOTE — Progress Notes (Signed)
The patient's EGD was normal.  There is no explanation on this examination for melena.  The next step would be colonoscopy to further investigate.  Patient is agreeable to this but states he would like to go home and have this done later as an outpatient.  He is under the care of Dr.Feng from oncology.  Prior to endoscopy his blood pressure was high.  He was given medications per anesthesia to get his blood pressure into the acceptable range for sedation.  He needs to get started back on his blood pressure medicines.

## 2018-11-27 NOTE — Transfer of Care (Signed)
Immediate Anesthesia Transfer of Care Note  Patient: Nicholas Lowery  Procedure(s) Performed: ESOPHAGOGASTRODUODENOSCOPY (EGD) WITH PROPOFOL (N/A )  Patient Location: Endoscopy Unit  Anesthesia Type:MAC  Level of Consciousness: awake, alert , oriented and patient cooperative  Airway & Oxygen Therapy: Patient Spontanous Breathing and Patient connected to nasal cannula oxygen  Post-op Assessment: Report given to RN and Post -op Vital signs reviewed and stable  Post vital signs: Reviewed and stable  Last Vitals:  Vitals Value Taken Time  BP    Temp    Pulse    Resp    SpO2      Last Pain:  Vitals:   11/27/18 1333  TempSrc:   PainSc: 0-No pain      Patients Stated Pain Goal: 0 (48/27/07 8675)  Complications: No apparent anesthesia complications

## 2018-11-28 ENCOUNTER — Encounter: Payer: Self-pay | Admitting: Hematology

## 2018-11-28 ENCOUNTER — Telehealth: Payer: Self-pay

## 2018-11-28 ENCOUNTER — Encounter (HOSPITAL_COMMUNITY): Payer: Self-pay | Admitting: Gastroenterology

## 2018-11-28 ENCOUNTER — Other Ambulatory Visit: Payer: Self-pay | Admitting: Hematology

## 2018-11-28 MED ORDER — LORAZEPAM 1 MG PO TABS: 1.0000 mg | ORAL_TABLET | ORAL | 0 refills | Status: DC | PRN

## 2018-11-28 NOTE — Anesthesia Postprocedure Evaluation (Signed)
Anesthesia Post Note  Patient: Nicholas Lowery  Procedure(s) Performed: ESOPHAGOGASTRODUODENOSCOPY (EGD) WITH PROPOFOL (N/A )     Patient location during evaluation: Endoscopy Anesthesia Type: MAC Level of consciousness: awake and alert Pain management: pain level controlled Vital Signs Assessment: post-procedure vital signs reviewed and stable Respiratory status: spontaneous breathing, nonlabored ventilation and respiratory function stable Cardiovascular status: stable and blood pressure returned to baseline Postop Assessment: no apparent nausea or vomiting Anesthetic complications: no    Last Vitals:  Vitals:   11/27/18 1435 11/27/18 1454  BP: (!) 158/103 (!) 164/114  Pulse: 84 83  Resp:  18  Temp:  36.9 C  SpO2: 97% 96%    Last Pain:  Vitals:   11/27/18 1454  TempSrc: Oral  PainSc:                  Nicholas Lowery,W. EDMOND

## 2018-11-28 NOTE — Telephone Encounter (Signed)
Faxed script for Ativan to [facility] at [phone]

## 2018-11-29 ENCOUNTER — Telehealth: Payer: Self-pay

## 2018-11-29 ENCOUNTER — Other Ambulatory Visit: Payer: Self-pay | Admitting: Medical

## 2018-11-29 ENCOUNTER — Encounter: Payer: Self-pay | Admitting: Hematology

## 2018-11-29 DIAGNOSIS — C189 Malignant neoplasm of colon, unspecified: Secondary | ICD-10-CM

## 2018-11-29 LAB — CULTURE, BLOOD (ROUTINE X 2)
Culture: NO GROWTH
Culture: NO GROWTH
Special Requests: ADEQUATE

## 2018-11-29 MED ORDER — PROCHLORPERAZINE MALEATE 10 MG PO TABS: 10.0000 mg | ORAL_TABLET | Freq: Four times a day (QID) | ORAL | 3 refills | Status: AC | PRN

## 2018-11-29 MED ORDER — LEVOFLOXACIN 750 MG PO TABS: 750.0000 mg | ORAL_TABLET | Freq: Every day | ORAL | 0 refills | Status: DC

## 2018-11-29 NOTE — Progress Notes (Signed)
[place]   [phone] [phone]   Clinic Follow up Note   Patient Care Team: Curlene Labrum, MD as PCP - General (Family Medicine)  Date of Service:  12/05/2018  CHIEF COMPLAINT: F/u of metastatic colon cancer  SUMMARY OF ONCOLOGIC HISTORY: Oncology History Overview Note  Cancer Staging Metastatic colon cancer to liver [facility]) Staging form: Colon and Rectum, AJCC 8th Edition - Clinical stage from 06/01/2016: Stage IVA (cTX, cNX, pM1a) - Signed by Truitt Merle, MD on 07/04/2016 - Pathologic stage from 06/14/2016: Stage IVA (pT4b(m), pN2b, pM1a) - Signed by Truitt Merle, MD on 07/04/2016     Metastatic colon cancer to liver ([place])  04/2015 Procedure   Colonoscopy by Dr. Ladona Horns. It showed showed 2 sessile polyps ready between 3-5 mm in size located 20 cm (A, B) from the point of entry, polypectomy was performed. Pedunculated polyp was found in the ascending colon (C), polypectomy was performed, and additional polyp (D) was found 30 cm from the point of entry, removed   04/2015 Pathology Results   tubular adenoma (A and B), and well differentiated adenocarcinoma arising from tubulovillous adenoma (C) and well differentiated adenocarcinoma arising from severe dysplasia to intramucosal carcinoma within tubular adenoma.    04/2015 Initial Diagnosis   Metastatic colon cancer to liver ([place])   05/29/2016 Imaging   CT abdomen and pelvis with contrast showed an apple core like stricture in right colon just above the cecum, measuring 3.2 cm in lengths. This is highly suspicious for malignancy. Small lymph node a noticed he had adjacent mesentery, measuring 8 mm. There is a low-density lesion within the inferior aspect of the right hepatic lobe measuring 1.7 cm, suspicious for metastasis.    06/06/2016 Tumor Marker   CEA 9.99   06/08/2016 PET scan   IMPRESSION: Approximately 3 cm hypermetabolic mass in the ascending colon, consistent with primary colon carcinoma.  This mass results in colonic obstruction and small bowel dilatation. Additional areas of hypermetabolic wall thickening in the cecum may represent other sites of colon carcinoma or colitis. Mild hypermetabolic lymphadenopathy in right pericolonic region, porta hepatis, and aortocaval space, consistent with metastatic disease. Mild hypermetabolic mediastinal lymphadenopathy also seen, and thoracic lymph node metastases cannot be excluded. Solitary hypermetabolic focus in inferior right hepatic lobe, consistent with liver metastasis. Consider abdomen MRI without and with contrast for further evaluation.   06/14/2016 Surgery   Hand assisted right hemicolectomy and small bowel resection for colon cancer, liver biopsy, by Dr. Barry Dienes   06/14/2016 Pathology Results   Right hemicolectomy showed invasive well to moderately differentiated adenocarcinoma, 2 foci measuring 7.5 cm and 4.5 cm, tumor invades through full thickness of colon, to the seroma and involve the Small Bowel, Surgical Margins Were Negative, 24 Out Of 64 Lymph Nodes Were Positive, Extracapsular Extension Identified, Multiple Satellite Tumor Deposits Present, Liver Biopsy Showed Metastatic Adenocarcinoma.     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 No BRAF and NRAS mutation. MSI-stable, tumor burden low.   07/06/2016 Tumor Marker   CEA 13.69   07/13/2016 - 01/18/2017 Chemotherapy   mFOLFOX, every 2 weeks, started on 07/14/2015, Avastin added from cycle 3  Oxaliplatin dose to '60mg'$ /m2 due to side effects and some cytopenia on 09/21/16  Changed to FOLFIRINOX starting cycle 7 and Reduced  Due to neuropathy hold Oxaliplatin and add Irinotecan with neulasta on day  3 starting with cycle 7 Add low dose Oxaliplatin with cycle 8.  Due to his worsening neuropathy, and good response to chemotherapy, I previously stopped  oxaliplatin from cycle 11, and continue FOLFIRI and avastin      07/27/2016 Tumor Marker   CEA 15.85   09/04/2016 Imaging   Ct C/A/P W Contrast IMPRESSION: Interval right colectomy. Stable small liver metastasis in the inferior right hepatic lobe. Stable mild porta hepatis and aortocaval lymphadenopathy. Stable mild mediastinal lymphadenopathy. No new or progressive metastatic disease identified within the chest, abdomen, or pelvis.   12/19/2016 PET scan   IMPRESSION: 1. Right hemicolectomy, with resolution of the prior hypermetabolic activity inferiorly in the right hepatic lobe, in several mediastinal lymph nodes, and in lymph nodes in the retroperitoneum and porta hepatis. No residual hypermetabolic or enlarged lymph nodes are identified. 2. Low-grade diffuse skeletal metabolic activity is likely therapy related. 3. Coronary atherosclerosis. 4. 3 by 4 mm right middle lobe pulmonary nodule is stable, not appreciably hypermetabolic, but below sensitive PET-CT size thresholds. This may warrant surveillance.   01/11/2017 Imaging   MR Abdomen W WO Contrast IMPRESSION: 1. No acute findings within the abdomen. Previously noted liver metastasis has resolved in the interval. No new lesions.   02/01/2017 - 09/2017 Chemotherapy   Maintenance therapy, Xeloda 2069m (1000 mg/m2)  q12h on day 1-14 every 21 days plus AVASTIN, starting 02/01/2017.  stopped after 12 days due to poor tolerance on 02/14/17  Changed to maintenance 5-FU and avastin every 2 weeks starting on 02/22/17, stopped in 09/2017.      04/10/2017 Imaging   IMPRESSION: 1. Status post right hemicolectomy without findings for recurrent tumor. 2. No worrisome hepatic lesions. Treated disease with only a small residual low attenuation lesion in the right hepatic lobe. 3. No recurrent mediastinal or abdominal lymphadenopathy.    07/09/2017 PET scan   PET 07/09/17 IMPRESSION: 1. Status post right hemicolectomy, without findings of  hypermetabolic recurrent or metastatic disease. 2. A right middle lobe pulmonary nodule is unchanged. However, there is a right lower lobe 5 mm pulmonary nodule which is felt to be new and enlarged compared to prior exams. Suspicious for an isolated pulmonary metastasis. Consider CT follow-up at 3-6 months. 3. Age advanced coronary artery atherosclerosis. Recommend assessment of coronary risk factors and consideration of medical therapy. 4. Borderline ascending aortic dilatation, 4.0 cm     10/10/2017 Imaging   IMPRESSION: 1. Several (at least 10) subcentimeter pulmonary nodules scattered in both lungs, predominantly in the lower lobes, all new/increased, most compatible with enlarging pulmonary metastases, largest 8 mm in the right lower lobe. 2. No additional findings of new or progressive metastatic disease. No recurrent adenopathy. Stable small low-attenuation lesion in the inferior right liver lobe compatible with treated metastasis. No new liver metastases. 3. Stable ectatic 4.0 cm ascending thoracic aorta. Recommend annual imaging followup by CTA or MRA.  4. Stable mild splenomegaly.    10/11/2017 - 12/2017 Chemotherapy   FOLFIRI and Avastin every 2 weeks starting 10/11/17, irnotecan stopped in 12/2017 due to disease progression     01/08/2018 Imaging   01/08/2018 CT CAP IMPRESSION: 1. Progressive hepatic and pulmonary metastatic disease. 2. Ascending Aortic aneurysm NOS (ICD10-I71.9).   01/08/2018 Progression   01/08/2018 CT CAP IMPRESSION: 1. Progressive hepatic and pulmonary metastatic disease. 2. Ascending Aortic aneurysm NOS (ICD10-I71.9).   01/10/2018 - 03/07/2018 Chemotherapy   FOLFOX and Avastin every 2 weeks starting on 01/10/2018. Stopped due to disease progression    03/19/2018 Progression  03/19/2018 CT CAP IMPRESSION: 1. Interval increase in size of dominant nodule within the right lower lobe. There are a few additional nodules which have increased in size.  Multiple additional small bilateral pulmonary nodules are grossly similar. 2. Slight interval increase in size of lesion within the right hepatic lobe. 3. Interval increase in soft tissue at the surgical anastomosis involving the small bowel within the central abdomen. Locally recurrent disease not excluded.    03/21/2018 - 05/15/2018 Chemotherapy   Third lineXeloda 1579m BID 7 days on/7 days off and Avastin every 2 weeksstarting 04/07/18 stopped after 1 cycle and 4 days because he started Clinical trail.    05/22/2018 [facility]   [facility]   LINES AND TUBES: None.  LOWER THORAX: Please see same day CT chest for findings above the diaphragm..Marland Kitchen HEPATOBILIARY: Low-attenuation lesion in segment 5 has minimally increased in size from prior measuring 2.4 x 2.0 cm, previously 2.3 x 1.8 cm when remeasured (8:42). The gallbladder is present and otherwise unremarkable. No biliary dilatation.   SPLEEN: Unremarkable. PANCREAS: Unremarkable.  ADRENALS: Unremarkable. KIDNEYS/URETERS: Unremarkable.  BLADDER: Decompressed making further evaluation difficult. PELVIC/REPRODUCTIVE ORGANS: Unremarkable.  GI TRACT: No dilated or thick walled loops of bowel. Sequelae of right hemicolectomy and appendectomy.   PERITONEUM/RETROPERITONEUM AND MESENTERY: Minimally decreased spiculated mesenteric soft tissue density at the site of enteroenteric anastomosis, now measuring roughly 2.6 x 1.4 cm, previously 3.3 x 3.1 cm (8:54). There is also a mild amount of stranding near the anastomosis within the mid abdomen (8:54)   LYMPH NODES: Borderline enlarged 1.0 cm porta hepatic lymph node, previously 0.7 cm (8:26). VESSELS: The aorta is normal in caliber.  No significant calcified atherosclerotic disease. Though the contrast timing is suboptimal for evaluation of the portal venous system, the portal venous system is appears patent. The hepatic veins and IVC are unremarkable.  BONES AND SOFT  TISSUES: Small fat-containing ventral hernia..   05/24/2018 - 08/02/2018 Chemotherapy   [facility] [id number] with palbociclib (Ibrnace) 1069m3 weeks on/1 week off and binimetinib 15 mg BID starting 05/24/18. Stopped 08/02/18 due to disease progression.    08/02/2018 Imaging   [facility]  Since 05/22/2018:  -Unchanged to mildly decreased in size segment 6 hepatic lesion.  -Unchanged irregular soft tissue density at the enteroenteric anastomosis.  -Increase in size of an irregular soft tissue density at the enterocolic anastomosis. This is new since 03/19/2018.  -Mild decrease in size of porta hepatis lymph node.  IMPRESSION:  Slight interval increase in multiple metastatic pulmonary nodules.  New likely malignant small right pleural effusion. Interval prominent right infrahilar lymph nodes; metastasis is in the differential. Mild interlobular septal thickening in the right middle and lower lobes is concerning for lymphangitic tumor spread. Attention on follow-up is suggested.     08/29/2018 Imaging   CT AP 08/29/18 IMPRESSION: 1. No acute CT findings of the abdomen or pelvis to explain abdominal pain.  2. Evidence of worsening metastatic colon malignancy status post right colon resection, including increasing mesenteric nodularity, increasing peritoneal nodularity, enlarging liver metastasis, and new and enlarging pulmonary nodules.  3. Trace perihepatic ascites, of uncertain etiology, although presumably malignant given peritoneal metastatic disease.   09/02/2018 Imaging   CT chest 09/02/18  IMPRESSION: 1. Unfortunately there is interval increase in size of bilateral pulmonary nodules consistent with progression of pulmonary metastasis. 2. New small moderate RIGHT pleural effusion. 3. Interval increase in size of small mediastinal lymph nodes  most consistent with metastatic adenopathy. 4. Interval increase in size of hepatic metastasis in  the inferior RIGHT hepatic lobe compared to CT 03/19/2018.   09/04/2018 - 11/2018 Chemotherapy   oral Regorafenib '80mg'$  daily 3 weeks on, 1 week off and IV Nivolumab '3mg'$ /kg q2weeks starting 09/04/18. Nivo d/c after cycle 2 due to lack of coverage. Will stop week of 11/18/18 due to disease progression.    11/13/2018 Imaging   CT CAP W Constrast  IMPRESSION: 1. There is been interval enlargement of segment 6 liver metastases. Additionally mesenteric tumor at the enterocolonic anastomosis is mildly increased in size from previous exam. Peritoneal deposit along at the umbilicus is mildly increased in size in the interval. New ascites. Findings are concerning for mild progression of disease within the abdomen and pelvis. 2. No significant change in diffuse pulmonary metastases. Stable volume of right pleural effusion. 3. Previous index mesenteric lymph nodes are mildly increased in size in the interval. 4. New small pericardial effusion.    Chemotherapy   PENDING Lonsurf M-F 2 weeks on/2 weeks off starting in 2 weeks   11/25/2018 Pathology Results   CYTOLOGY Diagnosis PLEURAL FLUID, RIGHT (SPECIMEN 1 OF 1 COLLECTED 11/25/18): - MALIGNANT CELLS CONSISTENT WITH METASTATIC ADENOCARCINOMA - SEE COMMENT      CURRENT THERAPY:  PENDING Lonsurf M-F 2 weeks on/2 weeks off    INTERVAL HISTORY:  Nicholas Lowery is here for a follow up. He presents to the clinic alone. He called his wife to be included in the visit today. He notes this week his abdomen has been more bloated. He also has significant LE swelling and hard time breathing. He had US  Abdomen on 12/02/18 which showed small volume ascites and moderate right pleural effusion. Paracentesis was deferred as his fluids was close to his bile ducts. Lasix has helped him resolve his LE edema. He notes his breathing is also better but not completely baseline. He notes he will sleep but will snore a lot. He notes his BMs are improved since  hospitalization but has not had a BM in 2 days due to him not eating. He has been taking Senakot-S. He also notes he is not up as much as he usually is.  He is taking oxycodone 2-3 tabs q4hours overall 9 tabs a day. He is still on 25mg Fentanyl patch every 2 days. His pain is mostly at a 6/10 and with pain meds will go down to 1-2/10.    REVIEW OF SYSTEMS:   Constitutional: Denies fevers, chills or abnormal weight loss Eyes: Denies blurriness of vision Ears, nose, mouth, throat, and face: Denies mucositis or sore throat Respiratory: Denies cough or wheezes (+) dyspnea improved  Cardiovascular: Denies palpitation, chest discomfort or lower extremity swelling Gastrointestinal:  Denies nausea, heartburn or change in bowel habits (+) abdominal bloating with pain  Skin: Denies abnormal skin rashes Lymphatics: Denies new lymphadenopathy or easy bruising Neurological:Denies numbness, tingling or new weaknesses Behavioral/Psych: Mood is stable, no new changes  All other systems were reviewed with the patient and are negative.  MEDICAL HISTORY:  Past Medical History:  Diagnosis Date  . Anxiety   . Cancer of ascending colon ([place]   . Hypertension   . Seasonal allergies     SURGICAL HISTORY: Past Surgical History:  Procedure Laterality Date  . APPENDECTOMY  1992  . CATARACT EXTRACTION W/ INTRAOCULAR LENS IMPLANT Left 04/2016  . COLON SURGERY    . COLONOSCOPY W/ BIOPSIES AND POLYPECTOMY  04/2015  . ESOPHAGOGASTRODUODENOSCOPY (EGD)  WITH PROPOFOL N/A 11/27/2018   Procedure: ESOPHAGOGASTRODUODENOSCOPY (EGD) WITH PROPOFOL;  Surgeon: Wonda Horner, MD;  Location: [facility];  Service: Endoscopy;  Laterality: N/A;  . INGUINAL HERNIA REPAIR Right 1982  . IR RADIOLOGIST EVAL & MGMT  01/16/2017  . KNEE ARTHROSCOPY W/ MENISCECTOMY Right 1998  . LAPAROSCOPIC RIGHT COLECTOMY N/A 06/14/2016   Procedure: LAPAROSCOPIC HAND ASSISTED HEMICOLECTOMY AND SMALL BOWEL RESECTION.;  Surgeon: [name], MD;   Location: [place];  Service: General;  Laterality: N/A;  . LIVER BIOPSY Right 06/14/2016   Procedure: LIVER BIOPSY;  Surgeon: [name], MD;  Location: [place];  Service: General;  Laterality: Right;  Right Inferior Liver  . PORTACATH PLACEMENT N/A 07/06/2016   Procedure: INSERTION PORT-A-CATH;  Surgeon: [name], MD;  Location: [place];  Service: General;  Laterality: N/A;  . VASECTOMY  2005    I have reviewed the social history and family history with the patient and they are unchanged from previous note.  ALLERGIES:  is allergic to penicillins; xeloda [capecitabine]; and nivolumab.  MEDICATIONS:  Current Outpatient Medications  Medication Sig Dispense Refill  . albuterol (VENTOLIN HFA) 108 (90 Base) MCG/ACT inhaler Inhale 1-2 puffs into the lungs every 6 (six) hours as needed for wheezing or shortness of breath. (Patient taking differently: Inhale 1-2 puffs into the lungs every 4 (four) hours as needed for wheezing or shortness of breath. ) 1 Inhaler 2  . amLODipine (NORVASC) 10 MG tablet Take 1 tablet (10 mg total) by mouth daily. 30 tablet 5  . cyclobenzaprine (FLEXERIL) 5 MG tablet Take 1 tablet (5 mg total) by mouth 3 (three) times daily as needed for muscle spasms. 30 tablet 0  . fentaNYL (DURAGESIC) 50 MCG/HR Place 1 patch onto the skin every other day. 15 patch 0  . fluticasone (FLOVENT HFA) 110 MCG/ACT inhaler Inhale 2 puffs into the lungs 2 (two) times daily. 1 Inhaler 12  . gabapentin (NEURONTIN) 300 MG capsule TAKE ONE CAPSULE BY MOUTH THREE TIMES DAILY (Patient taking differently: Take 300 mg by mouth 3 (three) times daily. ) 90 capsule 1  . LORazepam (ATIVAN) 1 MG tablet Take 1 tablet (1 mg total) by mouth every 4 (four) hours as needed for anxiety or sleep (nausea). 20 tablet 0  . losartan (COZAAR) 100 MG tablet TAKE ONE TABLET BY MOUTH EVERY DAY (Patient taking differently: Take 100 mg by mouth daily. ) 30 tablet 0  . Multiple Vitamin (MULTIVITAMIN WITH  MINERALS) TABS tablet Take 1 tablet by mouth daily.    . naloxone (NARCAN) nasal spray 4 mg/0.1 mL Place into the nose.    . ondansetron (ZOFRAN) 4 MG tablet Take 1 tablet (4 mg total) by mouth every 6 (six) hours as needed for nausea. 20 tablet 0  . Oxycodone HCl 10 MG TABS Take 1-2 tablets (10-20 mg total) by mouth every 6 (six) hours as needed. (Patient taking differently: Take 10-20 mg by mouth every 6 (six) hours as needed (pain). ) 100 tablet 0  . polyethylene glycol (MIRALAX / GLYCOLAX) 17 g packet Take 17 g by mouth daily. (Patient taking differently: Take 17 g by mouth daily as needed for mild constipation. ) 14 each 0  . prochlorperazine (COMPAZINE) 10 MG tablet Take 1 tablet (10 mg total) by mouth every 6 (six) hours as needed for nausea or vomiting. 30 tablet 3  . senna-docusate (SENOKOT-S) 8.6-50 MG tablet Take 2 tablets by mouth 2 (two) times daily. (Patient taking differently: Take 2 tablets  by mouth as needed for mild constipation. )    . venlafaxine XR (EFFEXOR-XR) 150 MG 24 hr capsule TAKE ONE CAPSULE BY MOUTH DAILY WITH BREAKFAST (Patient taking differently: Take 150 mg by mouth daily with breakfast. ) 90 capsule 1  . fentaNYL (DURAGESIC) 25 MCG/HR Place 1 patch onto the skin every other day. In addition to 75mg/hr patch every 48 hours 5 patch 0  . furosemide (LASIX) 20 MG tablet Take 0.5 tablets (10 mg total) by mouth daily. 30 tablet 2  . ondansetron (ZOFRAN ODT) 8 MG disintegrating tablet Take 1 tablet (8 mg total) by mouth every 8 (eight) hours as needed for nausea or vomiting. 20 tablet 0  . trifluridine-tipiracil (LONSURF) 20-8.19 MG tablet Take 4 tabs ('80mg'$  trifluridine) PO in AM & 3 tabs ('60mg'$  trifluridine) in PM, immediately after food on days 1-5 & 8-12 of each 28d cycle (Patient not taking: Reported on 12/05/2018) 70 tablet 0  . zolpidem (AMBIEN CR) 12.5 MG CR tablet Take 1 tablet (12.5 mg total) by mouth at bedtime. 20 tablet 0   No current facility-administered  medications for this visit.     PHYSICAL EXAMINATION: ECOG PERFORMANCE STATUS: 2 - Symptomatic, <50% confined to bed  Vitals:   12/05/18 0824 12/05/18 0828  BP: (!) 126/95 (!) 121/91  Pulse: (!) 103   Resp: 17   Temp: 98.6 F (37 C)   SpO2: 98%    Filed Weights   12/05/18 0824  Weight: 180 lb 14.4 oz (82.1 kg)    GENERAL:alert, no distress and comfortable SKIN: skin color, texture, turgor are normal, no rashes or significant lesions EYES: normal, Conjunctiva are pink and non-injected, sclera clear  NECK: supple, thyroid normal size, non-tender, without nodularity LYMPH:  no palpable lymphadenopathy in the cervical, axillary  LUNGS: clear to auscultation and percussion with normal breathing effort (+) B/l pleural effusion  HEART: regular rate & rhythm and no murmurs and no lower extremity edema ABDOMEN:abdomen soft, non-tender and normal bowel sounds Musculoskeletal:no cyanosis of digits and no clubbing  NEURO: alert & oriented x 3 with fluent speech, no focal motor/sensory deficits  LABORATORY DATA:  I have reviewed the data as listed CBC Latest Ref Rng & Units 11/27/2018 11/26/2018 11/25/2018  WBC 4.0 - 10.5 K/uL 3.6(L) 3.5(L) 2.9(L)  Hemoglobin 13.0 - 17.0 g/dL 7.9(L) 8.0(L) 8.4(L)  Hematocrit 39.0 - 52.0 % 26.6(L) 26.6(L) 27.6(L)  Platelets 150 - 400 K/uL 130(L) 139(L) 160     CMP Latest Ref Rng & Units 12/05/2018 11/27/2018 11/26/2018  Glucose 70 - 99 mg/dL 98 78 81  BUN 6 - 20 mg/dL 14 17 23(H)  Creatinine 0.61 - 1.24 mg/dL 1.32(H) 1.17 1.35(H)  Sodium 135 - 145 mmol/L 139 137 135  Potassium 3.5 - 5.1 mmol/L 3.7 3.9 4.2  Chloride 98 - 111 mmol/L 104 111 109  CO2 22 - 32 mmol/L 25 19(L) 18(L)  Calcium 8.9 - 10.3 mg/dL 8.9 7.9(L) 7.7(L)  Total Protein 6.5 - 8.1 g/dL 6.4(L) 5.2(L) 5.1(L)  Total Bilirubin 0.3 - 1.2 mg/dL 0.4 0.4 0.2(L)  Alkaline Phos 38 - 126 U/L 76 53 56  AST 15 - 41 U/L '24 27 24  '$ ALT 0 - 44 U/L '24 21 20      '$ RADIOGRAPHIC STUDIES: I have  personally reviewed the radiological images as listed and agreed with the findings in the report. No results found.   ASSESSMENT & PLAN:  Nicholas Neidertis a 58y.o. male with   1. Right colon cancer  with liver, node and lung metastasis, pT4bN2bM1, stage IV, MSI-stable, KRAS G12D mutation (+), plural and peritoneal mets in 11/2018 -He was diagnosed in 04/2015. He is s/pright hemicolectomy due to bowel obstruction -He has had multiple line chemo, includingFOLFOX,FOLFIRINOX,FOLFIRI,maintenance 5-FU, Xeloda andAvastin(briefly), [id number]'100mg'$  3 weeks on/1 week off andbinimetinib 15 mgBID starting 1/10/20withDr. Truman Hayward -Unfortunatelyhe progressed on [facility] Trail anddeveloped severe abdominal pain secondary to peritoneal metastasis. -He proceeded with4thline therapy withRegorafeniband Nivo based on the REGONIVO clinical trialdata(JCO 37(15), 2019).He started on 09/04/18. -He wasnotable to gain coverage for Nivolamab, so we canceled after cycle 2 and he continued Regorafenib and titrated dose up. Unfortunately after about 3 months of Ragnorafinib CT CAP from 11/13/18 shows disease progression and regimen was stopped, last dose 11/20/2018 -Dr. Truman Hayward from [facility] has contacted our office with an opportunity for a phase 1 clinical trial of a MEK inhibitor. The patient is interested in this opportunity and scheduled to go to [facility] next Tuesday  -Although he is looking forward to screening and starting trail at [facility], I discussed starting would happen in another 2-3 weeks if he passed screenings and he will have to be in good performance status with adequate kidney and liver functions. He understands and still would like to try clinical trail, he plans to be seen by [facility] on 7/28.  -He has received Lonsurf by mail, will hold it for now, if he is not going to participate the clinical trail at [facility], will start [place]  -We again discussed his overall poor  prognosis and I recommend him to more focused on his health and his quality of life with his family and manage his symptoms. I recommend he try palliative home care service to assist him at home. He is interested.  -Labs reviewed, CMP WNL except Cr 1.32, Protein at 6.4, albumin 3, urine protein trace, Ferritin normal, Iron 22. CEA is still pending. Physical exam shows pleural effusion on exam. Will do Xray to see if thoracentesis is needed.  -F/u next week   2. Recent PNA, Malignant Pleural Effusion, Abdominal Ascites, LE edema   -With recent PNA he underwent thoracentesis on 11/25/18. Cytology showed malignant cells consistent with metastatic adenocarcinoma. --With oral '20mg'$  lasix his breathing has improved, but not at baseline, and his LE has resolved.  -His Cr at 1.32 today. Will reduce lasix to half tablet daily only as needed and increase water intake. He had pleural effusion on exam today (12/05/18). Will obtain chest Xray to determine if he need Thoracentesis.   3. Upper abdominal pain  -CT AP from 08/29/18 showed worsening metastatic colon malignancy and mesenteric and peritoneal nodularity. -His worsening pain is related to his disease progression exacerbated by his anxiety and depression. -Ipreviouslyincreasedhis Gabapentin to'300mg'$  at night to help him sleep. (09/04/18) -due to his uncontrolled abdominal pain, I will increased Fentanyl to 72mg every 2 days. He has increased his '10mg'$  oxycodone 2-3 tabs q4hours. I will increase his long acting fentanyl patch to 729m by prescribing 2551mtoday to decrease oxycodone use (12/05/18) -continue flexeril as needed   4. Depression, Anxiety, Insomnia -Pt has long-standing history of depression, has been on Prozac for decades, it was very well controlled in the past.  -He has good family and social support. -Ipreviouslyoffered chance to speak with our [facility].He declined for now. -He denies suicidal or self  harm thought or ideology. -Heis sleeping better now that pain is controlled.  -I encouraged him not to stress about work and to  speak to someone and release emotions. -Stable mood, he has declined counseling for now. -He feels with latest disease progression he feels this is expected, but still very emotional. He is willing to talk to [place] onEffexor and Ativan.He has been taking Gabapentin TID -His increased pain and dyspnea has made it difficult for him to sleep and has been using pain meds to help before bed. I discussed he should not take pain meds.  -He has ativan but this is not enough for him. I will call in long acting Ambien #20 tablets for him to try once 20-30 minutes before bed (12/05/18). He continue Ativan as needed for anxiety or sleep issues in the middle of the night. I advised him not to take ativan and Ambien at the same time as they are sedative.   5. Hypertension  -OnAmlodipineand Lisinopril, I refilled today -He does not want to follow-up with his primary care physician. -Will monitor asRegorafenibmay increase his blood pressure. -BP at121/91today (12/05/18),stable.I encouraged him to monitor at home.  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  -Continueoral iron supplements  7. Constipation  -likely secondary to pain medication -He only had 1 BMevery2-3 days. He takes Miralax every 1-2 days to avoid diarrhea.  -I encouraged him to take Miralax half dose daily.He can takeMilk of magnesium or magnesium citrate for severe constipation.  8. Anorexia and weight loss, N&V  -Secondary to underlying malignancy -He continues to have weight loss especially with worsened abdominal pain and disease progression.  -His appetiteand weight continues to drop. He has lost 10 pounds in the last month.  -I encouraged him to try Marinol more and to make sure he eats  increased carbohydrates in diet. -I encourage him to follow up with [place] week he had significant nausea and was not able to keep pills down. This lowered his appetite and weight. I called in Zofran today (12/05/18) -will continue monitoring, watch for signs of bowel obstruction   9. Goal of care discussion, reversed DNR/DNI -The patient understands the goal of care is palliative, and his overall prognosis is very poor.  -Hepreviously agreed with DNR but reversed it on his recent hospital admission. .    Plan -I prescribed him Ambien-CR for sleep.  -Increase fentanyl patch to 7mg by prescribing 245m today (he has 5048mhr patch at home) -Reduce lasix to half tablet and use only as needed -I called Zofran today, will watch for bowel obstruction  -Chest Xray today  -Send palliative care referral  -F/u next week [facility]  -I spoke with his wife on the phone during his visit   No problem-specific Assessment & Plan notes found for this encounter.   Orders Placed This Encounter  Procedures  . DG Chest 1 View    Standing Status:   Future    Number of Occurrences:   1    Standing Expiration Date:   12/05/2019    Order Specific Question:   Reason for Exam (SYMPTOM  OR DIAGNOSIS REQUIRED)    Answer:   evaluate right pleural effusion    Order Specific Question:   [facility]    [facility] Order Specific Question:   Radiology Contrast Protocol - do NOT remove file path    Answer:   [email or web address]  . Ambulatory referral to Hospice  Referral Priority:   Routine    Referral Type:   Consultation    Referral Reason:   Specialty Services Required    Requested Specialty:   Hospice Services    Number of Visits Requested:   1   All questions were answered. The patient knows to call the clinic with any problems, questions or  concerns. No barriers to learning was detected. I spent 30 minutes counseling the patient face to face. The total time spent in the appointment was 40 minutes and more than 50% was on counseling and review of test results     Truitt Merle, MD 12/05/2018   I, Joslyn Devon, am acting as scribe for Truitt Merle, MD.   I have reviewed the above documentation for accuracy and completeness, and I agree with the above.

## 2018-11-29 NOTE — Telephone Encounter (Signed)
Spoke to patient with Abdominal US appointment for Monday 7/20 at 8:30 does not have to be fasting and see Sandi Mealy PA at 9:30, he verbalized an understanding.

## 2018-11-29 NOTE — Telephone Encounter (Signed)
Nicholas Lowery reports "Vomited two hours after taking Levaquin this morning with an Ensure.  Ten ounces water consumed after this time.  Threw up about 8 oz in a bag that looked like Ensure residue and water.  2:00 pm only dry heaves.    -Yesterday, I don't recall when I took the [place] but threw up Thursday evening.   -Wednesday took Levaquin in the evening.  Threw up two hours later yellowish looking.  I have a ton of phlegm I just swallow because I can't get it up and out.  Now becoming able to breathe through my nose.  Anxiety unable to sleep lying down and on my back snore so bad."  Please advise.

## 2018-11-30 LAB — CULTURE, BODY FLUID W GRAM STAIN -BOTTLE
Culture: NO GROWTH
Special Requests: ADEQUATE

## 2018-12-02 ENCOUNTER — Ambulatory Visit (HOSPITAL_COMMUNITY)
Admission: RE | Admit: 2018-12-02 | Discharge: 2018-12-02 | Disposition: A | Discharge status: Home / Self Care | Payer: BC Managed Care – PPO | Source: Ambulatory Visit | Attending: Medical | Admitting: Medical

## 2018-12-02 ENCOUNTER — Other Ambulatory Visit: Payer: Self-pay | Admitting: Medical

## 2018-12-02 ENCOUNTER — Inpatient Hospital Stay (HOSPITAL_BASED_OUTPATIENT_CLINIC_OR_DEPARTMENT_OTHER): Payer: BC Managed Care – PPO | Admitting: Medical

## 2018-12-02 ENCOUNTER — Other Ambulatory Visit: Payer: Self-pay

## 2018-12-02 VITALS — BP 134/102 | HR 95 | Temp 98.9°F | Resp 18 | Ht 70.0 in | Wt 183.1 lb

## 2018-12-02 DIAGNOSIS — R062 Wheezing: Secondary | ICD-10-CM | POA: Diagnosis not present

## 2018-12-02 DIAGNOSIS — R634 Abnormal weight loss: Secondary | ICD-10-CM | POA: Diagnosis not present

## 2018-12-02 DIAGNOSIS — C787 Secondary malignant neoplasm of liver and intrahepatic bile duct: Secondary | ICD-10-CM | POA: Insufficient documentation

## 2018-12-02 DIAGNOSIS — C189 Malignant neoplasm of colon, unspecified: Secondary | ICD-10-CM

## 2018-12-02 DIAGNOSIS — G893 Neoplasm related pain (acute) (chronic): Secondary | ICD-10-CM | POA: Diagnosis not present

## 2018-12-02 DIAGNOSIS — C786 Secondary malignant neoplasm of retroperitoneum and peritoneum: Secondary | ICD-10-CM | POA: Diagnosis not present

## 2018-12-02 DIAGNOSIS — I10 Essential (primary) hypertension: Secondary | ICD-10-CM | POA: Diagnosis not present

## 2018-12-02 DIAGNOSIS — Z7951 Long term (current) use of inhaled steroids: Secondary | ICD-10-CM | POA: Diagnosis not present

## 2018-12-02 DIAGNOSIS — Z87891 Personal history of nicotine dependence: Secondary | ICD-10-CM | POA: Diagnosis not present

## 2018-12-02 DIAGNOSIS — C7800 Secondary malignant neoplasm of unspecified lung: Secondary | ICD-10-CM | POA: Diagnosis not present

## 2018-12-02 DIAGNOSIS — C182 Malignant neoplasm of ascending colon: Secondary | ICD-10-CM | POA: Diagnosis not present

## 2018-12-02 DIAGNOSIS — J90 Pleural effusion, not elsewhere classified: Secondary | ICD-10-CM | POA: Diagnosis not present

## 2018-12-02 DIAGNOSIS — I313 Pericardial effusion (noninflammatory): Secondary | ICD-10-CM | POA: Diagnosis not present

## 2018-12-02 DIAGNOSIS — F329 Major depressive disorder, single episode, unspecified: Secondary | ICD-10-CM | POA: Diagnosis not present

## 2018-12-02 DIAGNOSIS — D509 Iron deficiency anemia, unspecified: Secondary | ICD-10-CM | POA: Diagnosis not present

## 2018-12-02 DIAGNOSIS — R180 Malignant ascites: Secondary | ICD-10-CM

## 2018-12-02 DIAGNOSIS — E860 Dehydration: Secondary | ICD-10-CM | POA: Diagnosis not present

## 2018-12-02 DIAGNOSIS — F419 Anxiety disorder, unspecified: Secondary | ICD-10-CM | POA: Diagnosis not present

## 2018-12-02 DIAGNOSIS — Z9221 Personal history of antineoplastic chemotherapy: Secondary | ICD-10-CM | POA: Diagnosis not present

## 2018-12-02 DIAGNOSIS — K5900 Constipation, unspecified: Secondary | ICD-10-CM | POA: Diagnosis not present

## 2018-12-02 DIAGNOSIS — R630 Anorexia: Secondary | ICD-10-CM | POA: Diagnosis not present

## 2018-12-02 DIAGNOSIS — R600 Localized edema: Secondary | ICD-10-CM | POA: Diagnosis not present

## 2018-12-02 NOTE — Progress Notes (Signed)
[facility] Progress Note   Nicholas Lowery [id number] [birth year] 58 y.o.  Nicholas Lowery is managed by Nicholas Lowery  Actively treated with chemotherapy/immunotherapy/hormonal therapy: yes  Current therapy: awaiting Lonsurf  Next scheduled appointment with provider: 12/05/2018  Assessment: Plan:    Metastatic colon cancer to liver [facility]) - Plan: US Paracentesis  Malignant ascites   Metastatic colon cancer: Awaiting Lonsurf start. Was to see Nicholas Lowery from [facility] regarding a phase 1 clinical trial of a MEK inhibitor.  Unfortunately per Nicholas Lowery, Nicholas Lowery is leaving [facility].   Malignant abdominal ascites: An abdominal US returned showing moderate abdominal ascites. He will be having a paracentesis this afternoon.  Please see After Visit Summary for patient specific instructions.  Future Appointments  Date Time Provider Department Center  12/02/2018  2:00 PM [facility] 2 [facility] [place]  12/05/2018  7:45 AM [facility] LAB 3 [facility] None  12/05/2018  8:15 AM Malachy Mood, MD [facility] None  12/09/2018  9:30 AM Nicholas Lowery, Nicholas Greathouse., PA-C [facility] None    Orders Placed This Encounter  Procedures   US Paracentesis       Subjective:   Patient ID:  Nicholas Lowery is a 58 y.o. (DOB [birth year]) male.  Chief Complaint:  Chief Complaint  Patient presents with   Results    [place] Scan    HPI Nicholas Lowery  is a 58 year old with a metastatic colon cancer who is managed by Nicholas Lowery. He was recently hospitalized with pneumonia. He had a right thoracentesis completed with cytology showing malignant cells consistent with a metastatic adenocarcinoma. He was awaiting Lonsurf start and was to see Nicholas Lowery from [facility] regarding a phase 1 clinical trial of a MEK inhibitor.  Unfortunately per Nicholas Lowery, Nicholas Lowery is leaving [facility]. He contacted this office last Friday afternoon reporting that he was having abdominal distention and was concerned that he was developing  ascites. He had an abdominal US completed this morning which returned showing moderate abdominal ascites. He is scheduled to see Nicholas Lowery on 02/05/2019. He would like to speak with our chaplain. He is agreeable for me to reach out to her and ask that she contact him.   Medications: I have reviewed the patient's current medications.  Allergies:  Allergies  Allergen Reactions   Penicillins Other (See Comments)    UNSPECIFIED REACTION   Has patient had a PCN reaction causing immediate rash, facial/tongue/throat swelling, SOB or lightheadedness with hypotension: unknown Has patient had a PCN reaction causing severe rash involving mucus membranes or skin necrosis: unknown Has patient had a PCN reaction that required hospitalization unknown Has patient had a PCN reaction occurring within the last 10 years: No If all of the above answers are "NO", then may proceed with Cephalosporin use.    Xeloda [Capecitabine] Nausea And Vomiting and Other (See Comments)    Foot pain, patient couldn't walk, and bleeding gums   Nivolumab Nausea And Vomiting, Palpitations and Other (See Comments)    Infusion reaction: Chills, rigors, numbness. Symptoms improved with ondansetron, clonidine, famotidine, meperidine    Past Medical History:  Diagnosis Date   Anxiety    Cancer of ascending colon (HCC)    Hypertension    Seasonal allergies     Past Surgical History:  Procedure Laterality Date   APPENDECTOMY  1992   CATARACT EXTRACTION W/ INTRAOCULAR LENS IMPLANT Left 04/2016   COLON SURGERY     COLONOSCOPY W/ BIOPSIES AND POLYPECTOMY  04/2015   ESOPHAGOGASTRODUODENOSCOPY (EGD) WITH PROPOFOL  N/A 11/27/2018   Procedure: ESOPHAGOGASTRODUODENOSCOPY (EGD) WITH PROPOFOL;  Surgeon: Graylin Shiver, MD;  Location[facility];  Service: Endoscopy;  Laterality: N/A;   INGUINAL HERNIA REPAIR Right 1982   IR RADIOLOGIST EVAL & MGMT  01/16/2017   KNEE ARTHROSCOPY W/ MENISCECTOMY Right 1998   LAPAROSCOPIC  RIGHT COLECTOMY N/A 06/14/2016   Procedure: LAPAROSCOPIC HAND ASSISTED HEMICOLECTOMY AND SMALL BOWEL RESECTION.;  Surgeon: Almond Lint, MD;  Location: [facility];  Service: General;  Laterality: N/A;   LIVER BIOPSY Right 06/14/2016   Procedure: LIVER BIOPSY;  Surgeon: Almond Lint, MD;  Location: [facility] OR;  Service: General;  Laterality: Right;  Right Inferior Liver   PORTACATH PLACEMENT N/A 07/06/2016   Procedure: INSERTION PORT-A-CATH;  Surgeon: Almond Lint, MD;  Location: [facility];  Service: General;  Laterality: N/A;   VASECTOMY  2005    Family History  Problem Relation Age of Onset   Cancer Mother        lung cancer   Stroke Mother    Hypertension Father    CAD Father    Cancer Maternal Grandfather        prostate cancer     Social History   Socioeconomic History   Marital status: Married    Spouse name: Not on file   Number of children: Not on file   Years of education: Not on file   Highest education level: Not on file  Occupational History   Not on file  Social Needs   Financial resource strain: Not on file   Food insecurity    Worry: Not on file    Inability: Not on file   Transportation needs    Medical: Not on file    Non-medical: Not on file  Tobacco Use   Smoking status: Former Smoker    Years: 2.00    Types: Cigarettes    Quit date: 1990    Years since quitting: 30.5   Smokeless tobacco: Never Used  Substance and Sexual Activity   Alcohol use: Yes    Alcohol/week: 20.0 standard drinks    Types: 20 Cans of beer per week    Comment: pt states he drinks about 3-4 beers per night   Drug use: No    Comment: 06/15/2016 "nothing since college"   Sexual activity: Yes  Lifestyle   Physical activity    Days per week: Not on file    Minutes per session: Not on file   Stress: Not on file  Relationships   Social connections    Talks on phone: Not on file    Gets together: Not on file    Attends religious service: Not on file     Active member of club or organization: Not on file    Attends meetings of clubs or organizations: Not on file    Relationship status: Not on file   Intimate partner violence    Fear of current or ex partner: Not on file    Emotionally abused: Not on file    Physically abused: Not on file    Forced sexual activity: Not on file  Other Topics Concern   Not on file  Social History Narrative   Not on file    Past Medical History, Surgical history, Social history, and Family history were reviewed and updated as appropriate.   Please see review of systems for further details on the patient's review from today.   Review of Systems:  Review of Systems  Constitutional: Negative for  activity change, appetite change, chills, diaphoresis and fever.  Respiratory: Negative for cough, chest tightness and shortness of breath.   Cardiovascular: Negative for chest pain, palpitations and leg swelling.  Gastrointestinal: Positive for abdominal distention. Negative for abdominal pain, constipation, diarrhea, nausea and vomiting.  Psychiatric/Behavioral: The patient is nervous/anxious.     Objective:   Physical Exam:  BP (!) 134/102 (BP Location: Left Arm, Patient Position: Sitting) Comment: Notified Nurse of BP   Pulse 95    Temp 98.9 F (37.2 C) (Temporal)    Resp 18    Ht 5\' 10"  (1.778 m)    Wt 183 lb 1.6 oz (83.1 kg)    SpO2 98%    BMI 26.27 kg/m  ECOG: 1  Physical Exam Constitutional:      General: He is not in acute distress.    Appearance: He is not ill-appearing.  HENT:     Head: Normocephalic and atraumatic.  Neurological:     Mental Status: He is alert.     Coordination: Coordination normal.     Gait: Gait normal.  Psychiatric:     Comments: Mr. Lenger in intermittently tearful.     Lab Review:     Component Value Date/Time   NA 137 11/27/2018 0320   NA 139 05/17/2017 0931   K 3.9 11/27/2018 0320   K 4.9 05/17/2017 0931   CL 111 11/27/2018 0320   CO2 19 (L)  11/27/2018 0320   CO2 27 05/17/2017 0931   GLUCOSE 78 11/27/2018 0320   GLUCOSE 86 05/17/2017 0931   BUN 17 11/27/2018 0320   BUN 16.8 05/17/2017 0931   CREATININE 1.17 11/27/2018 0320   CREATININE 1.79 (H) 11/24/2018 1728   CREATININE 1.1 05/17/2017 0931   CALCIUM 7.9 (L) 11/27/2018 0320   CALCIUM 9.8 05/17/2017 0931   PROT 5.2 (L) 11/27/2018 0320   PROT 7.0 05/17/2017 0931   ALBUMIN 2.6 (L) 11/27/2018 0320   ALBUMIN 4.1 05/17/2017 0931   AST 27 11/27/2018 0320   AST 28 11/24/2018 1728   AST 17 05/17/2017 0931   ALT 21 11/27/2018 0320   ALT 22 11/24/2018 1728   ALT 16 05/17/2017 0931   ALKPHOS 53 11/27/2018 0320   ALKPHOS 44 05/17/2017 0931   BILITOT 0.4 11/27/2018 0320   BILITOT 0.5 11/24/2018 1728   BILITOT 0.57 05/17/2017 0931   GFRNONAA >60 11/27/2018 0320   GFRNONAA 42 (L) 11/24/2018 1728   GFRAA >60 11/27/2018 0320   GFRAA 48 (L) 11/24/2018 1728       Component Value Date/Time   WBC 3.6 (L) 11/27/2018 0320   RBC 2.77 (L) 11/27/2018 0320   HGB 7.9 (L) 11/27/2018 0320   HGB 9.8 (L) 11/05/2018 0918   HGB 13.4 05/17/2017 0931   HCT 26.6 (L) 11/27/2018 0320   HCT 40.5 05/17/2017 0931   PLT 130 (L) 11/27/2018 0320   PLT 175 11/05/2018 0918   PLT 119 (L) 05/17/2017 0931   MCV 96.0 11/27/2018 0320   MCV 96.9 05/17/2017 0931   MCH 28.5 11/27/2018 0320   MCHC 29.7 (L) 11/27/2018 0320   RDW 15.7 (H) 11/27/2018 0320   RDW 15.3 (H) 05/17/2017 0931   LYMPHSABS 0.1 (L) 11/24/2018 1200   LYMPHSABS 0.7 (L) 05/17/2017 0931   MONOABS 0.4 11/24/2018 1200   MONOABS 0.5 05/17/2017 0931   EOSABS 0.5 11/24/2018 1200   EOSABS 0.1 05/17/2017 0931   BASOSABS 0.0 11/24/2018 1200   BASOSABS 0.0 05/17/2017 0931   -------------------------------  Imaging from last  24 hours (if applicable):  Radiology interpretation: Dg Chest 1 View  Result Date: 11/25/2018 CLINICAL DATA:  Status post RIGHT thoracentesis. EXAM: CHEST  1 VIEW COMPARISON:  None. FINDINGS: The RIGHT pleural  effusion has decreased in size. Mild RIGHT basilar atelectasis persists. There is no evidence of pneumothorax. A LEFT Port-A-Cath is again noted. The LEFT lung is clear. IMPRESSION: Decreased RIGHT pleural effusion with mild persistent RIGHT basilar atelectasis. No pneumothorax. Electronically Signed   By: Harmon Pier M.D.   On: 11/25/2018 14:07   Dg Chest 2 View  Result Date: 11/05/2018 CLINICAL DATA:  History of metastatic colon cancer. Cough for 2-3 months. EXAM: CHEST - 2 VIEW COMPARISON:  CT chest 09/02/2018.  PA and lateral chest 02/24/2017. FINDINGS: The patient has a small right pleural effusion and basilar atelectasis. Scattered pulmonary nodules are identified, most prominent in the right lower lobe. No pneumothorax. No consolidative process. Heart size is normal. No acute or focal bony abnormality. IMPRESSION: Small right pleural effusion and pulmonary nodules. The appearance is not markedly changed compared to the prior chest CT. Electronically Signed   By: Drusilla Kanner M.D.   On: 11/05/2018 08:58   Ct Chest W Contrast  Result Date: 11/13/2018 CLINICAL DATA:  Restaging metastatic colon cancer EXAM: CT CHEST, ABDOMEN, AND PELVIS WITH CONTRAST TECHNIQUE: Multidetector CT imaging of the chest, abdomen and pelvis was performed following the standard protocol during bolus administration of intravenous contrast. CONTRAST:  OMNIPAQUE IOHEXOL 300 MG/ML  SOLN COMPARISON:  CT chest 09/02/2018 and CT AP 08/29/2018 FINDINGS: CT CHEST FINDINGS Cardiovascular: The heart size appears normal. Small pericardial effusion, new. Lad coronary artery calcifications. Mediastinum/Nodes: Normal appearance of the thyroid gland. The trachea appears patent and is midline. Normal appearance of the esophagus. -index right paratracheal lymph node measures 1.2 cm, image 14/2. Previously 1.0 cm. Index right paratracheal lymph node measures 1 cm, image 16/2. Previously 0.9 cm. Index right pre-vascular node measures 0.9  cm, image 23/2. Previously 0.7 cm. Right hilar node measures 2 cm, image 33/2.  Previously 1.3 cm. Lungs/Pleura: Similar small to moderate right pleural effusion. Index posteromedial right lower lobe lung nodule measures 1 cm, image 80/6. Previously 0.9 cm. Index superior segment of left lower lobe lung nodule measures 1.9 cm, image 44/6. Unchanged. Index left upper lobe lung nodule measures 0.6 cm, image 69/6. Previously 0.5 cm. Index right upper lobe lung nodule measures 0.6 cm, image 44/6. Previously 0.7 cm. Index right middle lobe perifissural nodule measures 1 cm, image 91/6. Previously 0.8 cm. Index perifissural right lower lobe lung nodule measures 0.8 cm, image 112/6. Previously 1 cm. Musculoskeletal: No chest wall mass or suspicious bone lesions identified. CT ABDOMEN PELVIS FINDINGS Hepatobiliary: Index lesion within segment 6 measures 4.2 cm, image 72/2. Previously 3.2 cm. New, tiny nonspecific hypodensity along the dome measures 7 mm. Gallbladder normal. No biliary dilatation. Pancreas: Unremarkable. No pancreatic ductal dilatation or surrounding inflammatory changes. Spleen: The spleen is enlarged measuring 15.3 cm in cranial caudal dimension, unchanged. No focal splenic lesion. Adrenals/Urinary Tract: Normal adrenal glands. No kidney mass or hydronephrosis. Urinary bladder normal. Stomach/Bowel: The stomach is nondistended. Status post right hemicolectomy with entero colonic anastomosis. 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 Previously 4.0 x 4.8 cm. No evidence for high-grade bowel obstruction. Vascular/Lymphatic: Normal appearance of the abdominal aorta. Tumor encasement of the ileocolic branches of the superior mesenteric artery and vein identified. There is venous congestion identified within the associated ileocolic mesentery. Portal  vein remains patent. Reproductive: Prostate is unremarkable. Other: New small volume ascites identified. Peritoneal implant at  the level of the umbilicus measures 2.7 by 1.4 cm, image 84/2. Previously 2.0 x 1.3 cm. Musculoskeletal: No acute or significant osseous findings. IMPRESSION: 1. There is been interval enlargement of segment 6 liver metastases. Additionally mesenteric tumor at the enterocolonic anastomosis is mildly increased in size from previous exam. Peritoneal deposit along at the umbilicus is mildly increased in size in the interval. New ascites. Findings are concerning for mild progression of disease within the abdomen and pelvis. 2. No significant change in diffuse pulmonary metastases. Stable volume of right pleural effusion. 3. Previous index mesenteric lymph nodes are mildly increased in size in the interval. 4. New small pericardial effusion. Electronically Signed   By: Signa Kell M.D.   On: 11/13/2018 13:27   Ct Angio Chest Pe W Or Wo Contrast  Result Date: 11/24/2018 CLINICAL DATA:  History of metastatic colon carcinoma with fever and abdominal pain EXAM: CT ANGIOGRAPHY CHEST CT ABDOMEN AND PELVIS WITH CONTRAST TECHNIQUE: Multidetector CT imaging of the chest was performed using the standard protocol during bolus administration of intravenous contrast. Multiplanar CT image reconstructions and MIPs were obtained to evaluate the vascular anatomy. Multidetector CT imaging of the abdomen and pelvis was performed using the standard protocol during bolus administration of intravenous contrast. CONTRAST:  80mL OMNIPAQUE IOHEXOL 350 MG/ML SOLN COMPARISON:  Similar exam dated 11/13/2018 FINDINGS: CTA CHEST FINDINGS Cardiovascular: Thoracic aorta demonstrates a normal branching pattern. No aneurysmal dilatation or dissection is seen. The heart is normal in size and configuration. The pulmonary artery shows a normal branching pattern. No definitive filling defect to suggest pulmonary embolism is noted. Mediastinum/Nodes: Thoracic inlet is within normal limits. The esophagus is within normal limits. Scattered mediastinal  lymph nodes are again identified similar to that seen on the recent restaging examination from 11 days previous. Stable right hilar lymph node is noted as well. Lungs/Pleura: The lungs are well aerated bilaterally. Nodules are again identified bilaterally. The overall appearance is stable from the prior exam from 11 days previous. An enlarging right-sided pleural effusion is seen. New superimposed right lower lobe and right middle lobe infiltrate is seen. Musculoskeletal: Degenerative changes of the thoracic spine are noted. No metastatic lesions are seen. Left-sided chest wall port is noted Review of the MIP images confirms the above findings. CT ABDOMEN and PELVIS FINDINGS Hepatobiliary: Liver again demonstrates a hypodense lesion within the right lobe consistent with metastatic disease measuring 4.2 cm in greatest dimension. Small hypodensity in the dome of the liver is noted also stable from the prior study. Gallbladder demonstrates vicarious excretion of contrast. No ductal dilatation is seen. Pancreas: Unremarkable. No pancreatic ductal dilatation or surrounding inflammatory changes. Spleen: Normal in size without focal abnormality. Adrenals/Urinary Tract: Adrenal glands are within normal limits. Kidneys demonstrate no renal calculi or obstructive changes bilaterally. Normal excretion of contrast is seen. The bladder is decompressed. Stomach/Bowel: Colon is well visualized. Postsurgical changes are noted in the mid transverse colon consistent with the prior surgical history. There is again noted soft tissue mass in the mesentery adjacent to the anastomotic site best seen in the coronal plane on image number 40 of series 4. Just proximal to the small bowel colon anastomotic site there is some mild dilatation of the distal small bowel identified. Multiple edematous loops of small bowel are noted proximal to the anastomotic site. A mild partial small bowel obstruction at the anastomotic site cannot be totally  excluded. Vascular/Lymphatic: No aneurysmal dilatation is seen. The encasement of the superior mesenteric artery and vein by the adjacent mesenteric mass is less well visualized on today's exam due to the timing of the contrast bolus. There does remain however venous engorgement of the small bowel contributing to the degree of small bowel wall edema and likely ascites. Reproductive: Prostate is unremarkable. Other: Increase in ascites is noted both in the pelvis as well as surrounding the bowel loops and liver. Peritoneal implant is again noted near the umbilicus. Musculoskeletal: Degenerative changes of lumbar spine are seen. Review of the MIP images confirms the above findings. IMPRESSION: CTA of the chest: No evidence of pulmonary emboli are identified. Stable right hilar and mediastinal adenopathy is seen. Stable metastatic lesions within both lungs. Increasing right-sided pleural effusion with associated right lower and middle lobe infiltrates CT of the abdomen and pelvis: Postsurgical changes consistent with the given clinical history. There remains a mesenteric mass near the anastomotic site similar to that seen on the recent exam. Stable appearing hepatic metastatic disease is noted. Persistent venous engorgement is noted related to vascular encasement with new small bowel edematous changes identified. Some mild dilatation of the distal small bowel proximal to the anastomosis is seen. The possibility of mild narrowing at the anastomosis deserves consideration. This was not present on the most recent exam. This may be accentuated by the venous engorgement and small-bowel wall edema. Increasing ascites. There are changes consistent with peritoneal implantation stable from the prior exam. Electronically Signed   By: Alcide Clever M.D.   On: 11/24/2018 18:13   Ct Abdomen Pelvis W Contrast  Result Date: 11/24/2018 CLINICAL DATA:  History of metastatic colon carcinoma with fever and abdominal pain EXAM: CT  ANGIOGRAPHY CHEST CT ABDOMEN AND PELVIS WITH CONTRAST TECHNIQUE: Multidetector CT imaging of the chest was performed using the standard protocol during bolus administration of intravenous contrast. Multiplanar CT image reconstructions and MIPs were obtained to evaluate the vascular anatomy. Multidetector CT imaging of the abdomen and pelvis was performed using the standard protocol during bolus administration of intravenous contrast. CONTRAST:  80mL OMNIPAQUE IOHEXOL 350 MG/ML SOLN COMPARISON:  Similar exam dated 11/13/2018 FINDINGS: CTA CHEST FINDINGS Cardiovascular: Thoracic aorta demonstrates a normal branching pattern. No aneurysmal dilatation or dissection is seen. The heart is normal in size and configuration. The pulmonary artery shows a normal branching pattern. No definitive filling defect to suggest pulmonary embolism is noted. Mediastinum/Nodes: Thoracic inlet is within normal limits. The esophagus is within normal limits. Scattered mediastinal lymph nodes are again identified similar to that seen on the recent restaging examination from 11 days previous. Stable right hilar lymph node is noted as well. Lungs/Pleura: The lungs are well aerated bilaterally. Nodules are again identified bilaterally. The overall appearance is stable from the prior exam from 11 days previous. An enlarging right-sided pleural effusion is seen. New superimposed right lower lobe and right middle lobe infiltrate is seen. Musculoskeletal: Degenerative changes of the thoracic spine are noted. No metastatic lesions are seen. Left-sided chest wall port is noted Review of the MIP images confirms the above findings. CT ABDOMEN and PELVIS FINDINGS Hepatobiliary: Liver again demonstrates a hypodense lesion within the right lobe consistent with metastatic disease measuring 4.2 cm in greatest dimension. Small hypodensity in the dome of the liver is noted also stable from the prior study. Gallbladder demonstrates vicarious excretion of  contrast. No ductal dilatation is seen. Pancreas: Unremarkable. No pancreatic ductal dilatation or surrounding inflammatory changes. Spleen: Normal in size  without focal abnormality. Adrenals/Urinary Tract: Adrenal glands are within normal limits. Kidneys demonstrate no renal calculi or obstructive changes bilaterally. Normal excretion of contrast is seen. The bladder is decompressed. Stomach/Bowel: Colon is well visualized. Postsurgical changes are noted in the mid transverse colon consistent with the prior surgical history. There is again noted soft tissue mass in the mesentery adjacent to the anastomotic site best seen in the coronal plane on image number 40 of series 4. Just proximal to the small bowel colon anastomotic site there is some mild dilatation of the distal small bowel identified. Multiple edematous loops of small bowel are noted proximal to the anastomotic site. A mild partial small bowel obstruction at the anastomotic site cannot be totally excluded. Vascular/Lymphatic: No aneurysmal dilatation is seen. The encasement of the superior mesenteric artery and vein by the adjacent mesenteric mass is less well visualized on today's exam due to the timing of the contrast bolus. There does remain however venous engorgement of the small bowel contributing to the degree of small bowel wall edema and likely ascites. Reproductive: Prostate is unremarkable. Other: Increase in ascites is noted both in the pelvis as well as surrounding the bowel loops and liver. Peritoneal implant is again noted near the umbilicus. Musculoskeletal: Degenerative changes of lumbar spine are seen. Review of the MIP images confirms the above findings. IMPRESSION: CTA of the chest: No evidence of pulmonary emboli are identified. Stable right hilar and mediastinal adenopathy is seen. Stable metastatic lesions within both lungs. Increasing right-sided pleural effusion with associated right lower and middle lobe infiltrates CT of the  abdomen and pelvis: Postsurgical changes consistent with the given clinical history. There remains a mesenteric mass near the anastomotic site similar to that seen on the recent exam. Stable appearing hepatic metastatic disease is noted. Persistent venous engorgement is noted related to vascular encasement with new small bowel edematous changes identified. Some mild dilatation of the distal small bowel proximal to the anastomosis is seen. The possibility of mild narrowing at the anastomosis deserves consideration. This was not present on the most recent exam. This may be accentuated by the venous engorgement and small-bowel wall edema. Increasing ascites. There are changes consistent with peritoneal implantation stable from the prior exam. Electronically Signed   By: Alcide Clever M.D.   On: 11/24/2018 18:13   Ct Abdomen Pelvis W Contrast  Result Date: 11/13/2018 CLINICAL DATA:  Restaging metastatic colon cancer EXAM: CT CHEST, ABDOMEN, AND PELVIS WITH CONTRAST TECHNIQUE: Multidetector CT imaging of the chest, abdomen and pelvis was performed following the standard protocol during bolus administration of intravenous contrast. CONTRAST:  OMNIPAQUE IOHEXOL 300 MG/ML  SOLN COMPARISON:  CT chest 09/02/2018 and CT AP 08/29/2018 FINDINGS: CT CHEST FINDINGS Cardiovascular: The heart size appears normal. Small pericardial effusion, new. Lad coronary artery calcifications. Mediastinum/Nodes: Normal appearance of the thyroid gland. The trachea appears patent and is midline. Normal appearance of the esophagus. -index right paratracheal lymph node measures 1.2 cm, image 14/2. Previously 1.0 cm. Index right paratracheal lymph node measures 1 cm, image 16/2. Previously 0.9 cm. Index right pre-vascular node measures 0.9 cm, image 23/2. Previously 0.7 cm. Right hilar node measures 2 cm, image 33/2.  Previously 1.3 cm. Lungs/Pleura: Similar small to moderate right pleural effusion. Index posteromedial right lower lobe lung  nodule measures 1 cm, image 80/6. Previously 0.9 cm. Index superior segment of left lower lobe lung nodule measures 1.9 cm, image 44/6. Unchanged. Index left upper lobe lung nodule measures 0.6 cm, image 69/6. Previously  0.5 cm. Index right upper lobe lung nodule measures 0.6 cm, image 44/6. Previously 0.7 cm. Index right middle lobe perifissural nodule measures 1 cm, image 91/6. Previously 0.8 cm. Index perifissural right lower lobe lung nodule measures 0.8 cm, image 112/6. Previously 1 cm. Musculoskeletal: No chest wall mass or suspicious bone lesions identified. CT ABDOMEN PELVIS FINDINGS Hepatobiliary: Index lesion within segment 6 measures 4.2 cm, image 72/2. Previously 3.2 cm. New, tiny nonspecific hypodensity along the dome measures 7 mm. Gallbladder normal. No biliary dilatation. Pancreas: Unremarkable. No pancreatic ductal dilatation or surrounding inflammatory changes. Spleen: The spleen is enlarged measuring 15.3 cm in cranial caudal dimension, unchanged. No focal splenic lesion. Adrenals/Urinary Tract: Normal adrenal glands. No kidney mass or hydronephrosis. Urinary bladder normal. Stomach/Bowel: The stomach is nondistended. Status post right hemicolectomy with entero colonic anastomosis. 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 Previously 4.0 x 4.8 cm. No evidence for high-grade bowel obstruction. Vascular/Lymphatic: Normal appearance of the abdominal aorta. Tumor encasement of the ileocolic branches of the superior mesenteric artery and vein identified. There is venous congestion identified within the associated ileocolic mesentery. Portal vein remains patent. Reproductive: Prostate is unremarkable. Other: New small volume ascites identified. Peritoneal implant at the level of the umbilicus measures 2.7 by 1.4 cm, image 84/2. Previously 2.0 x 1.3 cm. Musculoskeletal: No acute or significant osseous findings. IMPRESSION: 1. There is been interval enlargement of  segment 6 liver metastases. Additionally mesenteric tumor at the enterocolonic anastomosis is mildly increased in size from previous exam. Peritoneal deposit along at the umbilicus is mildly increased in size in the interval. New ascites. Findings are concerning for mild progression of disease within the abdomen and pelvis. 2. No significant change in diffuse pulmonary metastases. Stable volume of right pleural effusion. 3. Previous index mesenteric lymph nodes are mildly increased in size in the interval. 4. New small pericardial effusion. Electronically Signed   By: Signa Kell M.D.   On: 11/13/2018 13:27   US Abdomen Limited  Result Date: 12/02/2018 CLINICAL DATA:  Metastatic colon cancer to the liver. Follow-up ascites. EXAM: LIMITED ABDOMEN ULTRASOUND FOR ASCITES TECHNIQUE: Limited ultrasound survey for ascites was performed in all four abdominal quadrants. COMPARISON:  Abdomen and pelvis CT dated 11/24/2018. FINDINGS: Moderate amount of free peritoneal fluid, increased since 11/24/2018. IMPRESSION: Moderate amount of ascites, increased. Electronically Signed   By: Beckie Salts M.D.   On: 12/02/2018 09:18   Dg Chest Port 1 View  Result Date: 11/24/2018 CLINICAL DATA:  Fever, cough, shortness of breath, nausea. EXAM: PORTABLE CHEST 1 VIEW COMPARISON:  November 05, 2018 FINDINGS: Left subclavian approach injectable port in stable position. Cardiomediastinal silhouette is mildly enlarged. Mediastinal contours appear intact. The left lung is clear. Right cardiophrenic airspace opacity and probable right pleural effusion. Previously noted by CT small pulmonary nodules are not seen radiographically. Osseous structures are without acute abnormality. Soft tissues are grossly normal. IMPRESSION: Right cardiophrenic airspace opacity and probable right pleural effusion. Electronically Signed   By: Ted Mcalpine M.D.   On: 11/24/2018 12:19   US Thoracentesis Asp Pleural Space W/img Guide  Result Date:  11/25/2018 INDICATION: Patient with history of shortness of breath, pneumonia, right pleural effusion. Request made for diagnostic and therapeutic thoracentesis. EXAM: ULTRASOUND GUIDED RIGHT DIAGNOSTIC AND THERAPEUTIC THORACENTESIS MEDICATIONS: 10 mL 1% lidocaine COMPLICATIONS: None immediate. PROCEDURE: An ultrasound guided thoracentesis was thoroughly discussed with the patient and questions answered. The benefits, risks, alternatives and complications were also discussed. The patient  understands and wishes to proceed with the procedure. Written consent was obtained. Ultrasound was performed to localize and mark an adequate pocket of fluid in the right chest. The area was then prepped and draped in the normal sterile fashion. 1% Lidocaine was used for local anesthesia. Under ultrasound guidance a 6 Fr Safe-T-Centesis catheter was introduced. Thoracentesis was performed. The catheter was removed and a dressing applied. FINDINGS: A total of approximately 1.3 liters of clear, yellow fluid was removed. Samples were sent to the laboratory as requested by the clinical team. IMPRESSION: Successful ultrasound guided diagnostic and therapeutic right thoracentesis yielding 1.3 liters of pleural fluid. Read by: Loyce Dys PA-C Electronically Signed   By: Richarda Overlie M.D.   On: 11/25/2018 14:33

## 2018-12-02 NOTE — Progress Notes (Signed)
Patient ID: Nicholas Lowery, male   DOB: 1960/10/10, 58 y.o.   MRN: [id number] Patient presented to ultrasound department today for paracentesis.  On limited ultrasound abdomen in all 4 quadrants there is only minimal ascites noted, none of which is accessible due to surrounding bowel loops.  Procedure canceled.  Patient notified. Mod rt pleural effusion noted.

## 2018-12-02 NOTE — Patient Instructions (Signed)
Paracentesis Paracentesis is a procedure to remove excess fluid from the abdomen. When a person collects too much fluid in the abdomen, it is called ascites. Ascites can result from certain conditions, such as chronic scarring of the liver (cirrhosis), cancer, heart failure, or infection. In paracentesis, the fluid is removed using a needle that is inserted through the skin and tissue into the abdomen. This procedure may be done to:  Find the cause of the ascites.  Relieve symptoms that are caused by the ascites, such as pain or shortness of breath. Tell a health care provider about:  Any allergies you have.  All medicines you are taking, including vitamins, herbs, eye drops, creams, and over-the-counter medicines.  Any problems you or family members have had with anesthetic medicines.  Any blood disorders you have.  Any surgeries you have had.  Any medical conditions you have.  Whether you are pregnant or may be pregnant. What are the risks? Generally, this is a safe procedure. However, problems may occur, including:  Infection.  Bleeding.  Injury to an abdominal organ, such as the bowel (large intestine), liver, spleen, or bladder.  Low blood pressure (hypotension).  Mental status changes in people who have liver disease. These changes would be caused by shifts in the balance of fluids and minerals (electrolytes) in the body. What happens before the procedure?  Ask your health care provider about: ? Changing or stopping your regular medicines. This is especially important if you are taking diabetes medicines or blood thinners. ? Taking medicines such as aspirin and ibuprofen. These medicines can thin your blood. Do not take these medicines unless your health care provider tells you to take them. ? Taking over-the-counter medicines, vitamins, herbs, and supplements.  Ask your health care provider what steps will be taken to help prevent infection. These may include washing  skin with a germ-killing soap.  A blood sample may be taken to determine your blood clotting time.  You may have imaging tests.  You will be asked to urinate. What happens during the procedure?   You may be asked to lie on your back with your head raised (elevated).  You will be given a medicine to numb the area (local anesthetic) where the needle will be inserted.  Your abdominal skin will be punctured with a needle.  A drainage tube will be connected to the needle. Fluid will drain through the tube into a container.  After enough fluid has been removed, the needle will be removed.  A sample of the fluid will be sent for examination and testing.  A bandage (dressing) will be placed over the puncture site. The procedure may vary among health care providers and hospitals. What happens after the procedure? It is up to you to get the results of your procedure. Ask your health care provider, or the department that is doing the procedure, when your results will be ready. Summary  Paracentesis is a procedure to remove excess fluid from the abdomen using a needle.  This procedure may be done to find the cause of ascites or to help relieve your symptoms.  Your skin will be numbed with medicine (local anesthetic) before the needle is inserted.  A sample of the removed fluid will be sent for examination and testing. This information is not intended to replace advice given to you by your health care provider. Make sure you discuss any questions you have with your health care provider. Document Released: 11/14/2004 Document Revised: 04/02/2018 Document Reviewed: 02/19/2018 Elsevier  Patient Education  [employer].

## 2018-12-02 NOTE — Progress Notes (Signed)
Pt presents today with appt to discuss results of [place] scan this am.  RLQ abd pain w/distention present, reports bilat lower leg edema and heaviness.  Pt denies N/V/D but some anorexia present d/t pain/swelling, SOB/DOE present d/t swelling.  Pt aware of paracentesis appt today.

## 2018-12-03 ENCOUNTER — Telehealth: Payer: Self-pay | Admitting: *Deleted

## 2018-12-03 NOTE — Telephone Encounter (Signed)
Called & left message for pt to return call to discuss [place] per Dr Ernestina Penna instructions: to start if not doing trial.

## 2018-12-04 NOTE — Telephone Encounter (Signed)
Oral Oncology Patient Advocate Encounter  Confirmed with [facility] that Frankey Poot was shipped on 7/17 and delivered on 7/18 with a $0 copay.  [facility] Phone [phone] Fax [phone] 12/04/2018   2:51 PM

## 2018-12-05 ENCOUNTER — Telehealth: Payer: Self-pay

## 2018-12-05 ENCOUNTER — Other Ambulatory Visit: Payer: Self-pay

## 2018-12-05 ENCOUNTER — Telehealth: Payer: Self-pay | Admitting: Hematology

## 2018-12-05 ENCOUNTER — Encounter: Payer: Self-pay | Admitting: Hematology

## 2018-12-05 ENCOUNTER — Inpatient Hospital Stay: Payer: BC Managed Care – PPO

## 2018-12-05 ENCOUNTER — Inpatient Hospital Stay (HOSPITAL_BASED_OUTPATIENT_CLINIC_OR_DEPARTMENT_OTHER): Payer: BC Managed Care – PPO | Admitting: Hematology

## 2018-12-05 ENCOUNTER — Ambulatory Visit (HOSPITAL_COMMUNITY)
Admission: RE | Admit: 2018-12-05 | Discharge: 2018-12-05 | Disposition: A | Discharge status: Home / Self Care | Payer: BC Managed Care – PPO | Source: Ambulatory Visit | Attending: Hematology | Admitting: Hematology

## 2018-12-05 VITALS — BP 121/91 | HR 103 | Temp 98.6°F | Resp 17 | Ht 70.0 in | Wt 180.9 lb

## 2018-12-05 DIAGNOSIS — C7800 Secondary malignant neoplasm of unspecified lung: Secondary | ICD-10-CM | POA: Diagnosis not present

## 2018-12-05 DIAGNOSIS — C189 Malignant neoplasm of colon, unspecified: Secondary | ICD-10-CM | POA: Diagnosis not present

## 2018-12-05 DIAGNOSIS — R634 Abnormal weight loss: Secondary | ICD-10-CM

## 2018-12-05 DIAGNOSIS — R630 Anorexia: Secondary | ICD-10-CM

## 2018-12-05 DIAGNOSIS — D500 Iron deficiency anemia secondary to blood loss (chronic): Secondary | ICD-10-CM

## 2018-12-05 DIAGNOSIS — C786 Secondary malignant neoplasm of retroperitoneum and peritoneum: Secondary | ICD-10-CM

## 2018-12-05 DIAGNOSIS — C182 Malignant neoplasm of ascending colon: Secondary | ICD-10-CM

## 2018-12-05 DIAGNOSIS — G893 Neoplasm related pain (acute) (chronic): Secondary | ICD-10-CM

## 2018-12-05 DIAGNOSIS — C787 Secondary malignant neoplasm of liver and intrahepatic bile duct: Secondary | ICD-10-CM | POA: Diagnosis not present

## 2018-12-05 DIAGNOSIS — D509 Iron deficiency anemia, unspecified: Secondary | ICD-10-CM

## 2018-12-05 DIAGNOSIS — I10 Essential (primary) hypertension: Secondary | ICD-10-CM

## 2018-12-05 DIAGNOSIS — J90 Pleural effusion, not elsewhere classified: Secondary | ICD-10-CM

## 2018-12-05 LAB — TOTAL PROTEIN, URINE DIPSTICK

## 2018-12-05 LAB — IRON AND TIBC
Iron: 22 ug/dL — ABNORMAL LOW (ref 42–163)
Saturation Ratios: 10 % — ABNORMAL LOW (ref 20–55)
TIBC: 225 ug/dL (ref 202–409)
UIBC: 203 ug/dL (ref 117–376)

## 2018-12-05 LAB — CMP (CANCER CENTER ONLY)
ALT: 24 U/L (ref 0–44)
AST: 24 U/L (ref 15–41)
Albumin: 3 g/dL — ABNORMAL LOW (ref 3.5–5.0)
Alkaline Phosphatase: 76 U/L (ref 38–126)
Anion gap: 10 (ref 5–15)
BUN: 14 mg/dL (ref 6–20)
CO2: 25 mmol/L (ref 22–32)
Calcium: 8.9 mg/dL (ref 8.9–10.3)
Chloride: 104 mmol/L (ref 98–111)
Creatinine: 1.32 mg/dL — ABNORMAL HIGH (ref 0.61–1.24)
GFR, Est AFR Am: >60 mL/min (ref >60)
GFR, Estimated: >60 mL/min (ref >60)
Glucose, Bld: 98 mg/dL (ref 70–99)
Potassium: 3.7 mmol/L (ref 3.5–5.1)
Sodium: 139 mmol/L (ref 135–145)
Total Bilirubin: 0.4 mg/dL (ref 0.3–1.2)
Total Protein: 6.4 g/dL — ABNORMAL LOW (ref 6.5–8.1)

## 2018-12-05 LAB — CEA (IN HOUSE-CHCC): CEA (CHCC-In House): 53.91 ng/mL — ABNORMAL HIGH (ref 0.00–5.00)

## 2018-12-05 LAB — FERRITIN: Ferritin: 103 ng/mL (ref 24–336)

## 2018-12-05 MED ORDER — FUROSEMIDE 20 MG PO TABS: 10.0000 mg | ORAL_TABLET | Freq: Every day | ORAL | 2 refills | Status: AC

## 2018-12-05 MED ORDER — ONDANSETRON HCL 4 MG PO TABS: 4.0000 mg | ORAL_TABLET | Freq: Four times a day (QID) | ORAL | 0 refills | Status: DC | PRN

## 2018-12-05 MED ORDER — FENTANYL 25 MCG/HR TD PT72: 1.0000 | MEDICATED_PATCH | TRANSDERMAL | 0 refills | Status: DC

## 2018-12-05 MED ORDER — ONDANSETRON 8 MG PO TBDP: 8.0000 mg | ORAL_TABLET | Freq: Three times a day (TID) | ORAL | 0 refills | Status: DC | PRN

## 2018-12-05 MED ORDER — ZOLPIDEM TARTRATE ER 12.5 MG PO TBCR: 12.5000 mg | EXTENDED_RELEASE_TABLET | Freq: Every day | ORAL | 0 refills | Status: DC

## 2018-12-05 NOTE — Telephone Encounter (Signed)
Scheduled appt per 7/23 los. °

## 2018-12-05 NOTE — Telephone Encounter (Signed)
Demographics and office note from today faxed to [facility] for Palliative Care Referral.  Sent to fax [phone]

## 2018-12-05 NOTE — Addendum Note (Signed)
Addended by: Truitt Merle on: 12/05/2018 03:15 PM   Modules accepted: Orders

## 2018-12-05 NOTE — Telephone Encounter (Signed)
Spoke with patient regarding x-ray results, increase in fluid in right lung, per [name] may be able to drain some fluid (thoracentesis), the patient would like to have this done.  I explained will call scheduling to see when it can be done.

## 2018-12-07 ENCOUNTER — Other Ambulatory Visit (HOSPITAL_COMMUNITY)
Admission: RE | Admit: 2018-12-07 | Discharge: 2018-12-07 | Disposition: A | Discharge status: Home / Self Care | Payer: BC Managed Care – PPO | Source: Ambulatory Visit | Attending: Hematology | Admitting: Hematology

## 2018-12-07 DIAGNOSIS — Z1159 Encounter for screening for other viral diseases: Secondary | ICD-10-CM | POA: Insufficient documentation

## 2018-12-07 LAB — SARS CORONAVIRUS 2 (TAT 6-24 HRS): SARS Coronavirus 2: NEGATIVE

## 2018-12-09 ENCOUNTER — Encounter: Payer: BC Managed Care – PPO | Admitting: Medical

## 2018-12-09 ENCOUNTER — Other Ambulatory Visit: Payer: Self-pay | Admitting: Hematology

## 2018-12-09 ENCOUNTER — Ambulatory Visit (HOSPITAL_COMMUNITY)
Admission: RE | Admit: 2018-12-09 | Discharge: 2018-12-09 | Disposition: A | Discharge status: Home / Self Care | Payer: BC Managed Care – PPO | Source: Ambulatory Visit | Attending: Hematology | Admitting: Hematology

## 2018-12-09 ENCOUNTER — Encounter (HOSPITAL_COMMUNITY): Payer: Self-pay | Admitting: Physician Assistant

## 2018-12-09 ENCOUNTER — Other Ambulatory Visit: Payer: Self-pay

## 2018-12-09 DIAGNOSIS — C189 Malignant neoplasm of colon, unspecified: Secondary | ICD-10-CM | POA: Diagnosis present

## 2018-12-09 DIAGNOSIS — J90 Pleural effusion, not elsewhere classified: Secondary | ICD-10-CM | POA: Diagnosis present

## 2018-12-09 DIAGNOSIS — C787 Secondary malignant neoplasm of liver and intrahepatic bile duct: Secondary | ICD-10-CM | POA: Insufficient documentation

## 2018-12-09 HISTORY — PX: IR THORACENTESIS ASP PLEURAL SPACE W/IMG GUIDE: IMG5380

## 2018-12-09 MED ORDER — LIDOCAINE HCL 1 % IJ SOLN
INTRAMUSCULAR | Status: AC
  Filled 2018-12-09: qty 20

## 2018-12-09 MED ORDER — LIDOCAINE HCL (PF) 1 % IJ SOLN
INTRAMUSCULAR | Status: DC | PRN
  Administered 2018-12-09: 10 mL

## 2018-12-09 NOTE — Procedures (Signed)
PROCEDURE SUMMARY:  Successful image-guided right thoracentesis. Yielded 2.1 liters of clear yellow fluid. Patient tolerated procedure well. EBL: Zero No immediate complications.  Specimen was not sent for labs. Post procedure CXR shows no pneumothorax.  Please see imaging section of Epic for full dictation.  Joaquim Nam PA-C 12/09/2018 2:58 PM

## 2018-12-11 ENCOUNTER — Telehealth: Payer: Self-pay

## 2018-12-11 ENCOUNTER — Encounter (HOSPITAL_COMMUNITY): Payer: Self-pay | Admitting: Emergency Medicine

## 2018-12-11 ENCOUNTER — Other Ambulatory Visit: Payer: Self-pay

## 2018-12-11 ENCOUNTER — Inpatient Hospital Stay (HOSPITAL_COMMUNITY)
Admission: EM | Admit: 2018-12-11 | Discharge: 2018-12-14 | DRG: 392 | Disposition: A | Discharge status: Home / Self Care | Payer: BC Managed Care – PPO | Attending: Internal Medicine | Admitting: Internal Medicine

## 2018-12-11 ENCOUNTER — Emergency Department (HOSPITAL_COMMUNITY): Payer: BC Managed Care – PPO

## 2018-12-11 ENCOUNTER — Other Ambulatory Visit: Payer: Self-pay | Admitting: Hematology

## 2018-12-11 DIAGNOSIS — Z888 Allergy status to other drugs, medicaments and biological substances status: Secondary | ICD-10-CM | POA: Diagnosis not present

## 2018-12-11 DIAGNOSIS — Z8249 Family history of ischemic heart disease and other diseases of the circulatory system: Secondary | ICD-10-CM

## 2018-12-11 DIAGNOSIS — A09 Infectious gastroenteritis and colitis, unspecified: Principal | ICD-10-CM | POA: Diagnosis present

## 2018-12-11 DIAGNOSIS — Z823 Family history of stroke: Secondary | ICD-10-CM | POA: Diagnosis not present

## 2018-12-11 DIAGNOSIS — C787 Secondary malignant neoplasm of liver and intrahepatic bile duct: Secondary | ICD-10-CM

## 2018-12-11 DIAGNOSIS — J910 Malignant pleural effusion: Secondary | ICD-10-CM | POA: Diagnosis present

## 2018-12-11 DIAGNOSIS — F329 Major depressive disorder, single episode, unspecified: Secondary | ICD-10-CM | POA: Diagnosis present

## 2018-12-11 DIAGNOSIS — I10 Essential (primary) hypertension: Secondary | ICD-10-CM | POA: Diagnosis present

## 2018-12-11 DIAGNOSIS — R000 Tachycardia, unspecified: Secondary | ICD-10-CM

## 2018-12-11 DIAGNOSIS — R188 Other ascites: Secondary | ICD-10-CM

## 2018-12-11 DIAGNOSIS — Z801 Family history of malignant neoplasm of trachea, bronchus and lung: Secondary | ICD-10-CM

## 2018-12-11 DIAGNOSIS — Z87891 Personal history of nicotine dependence: Secondary | ICD-10-CM

## 2018-12-11 DIAGNOSIS — C189 Malignant neoplasm of colon, unspecified: Secondary | ICD-10-CM

## 2018-12-11 DIAGNOSIS — K529 Noninfective gastroenteritis and colitis, unspecified: Secondary | ICD-10-CM | POA: Diagnosis present

## 2018-12-11 DIAGNOSIS — G893 Neoplasm related pain (acute) (chronic): Secondary | ICD-10-CM | POA: Diagnosis present

## 2018-12-11 DIAGNOSIS — Z66 Do not resuscitate: Secondary | ICD-10-CM | POA: Diagnosis present

## 2018-12-11 DIAGNOSIS — N179 Acute kidney failure, unspecified: Secondary | ICD-10-CM | POA: Diagnosis present

## 2018-12-11 DIAGNOSIS — R197 Diarrhea, unspecified: Secondary | ICD-10-CM | POA: Diagnosis not present

## 2018-12-11 DIAGNOSIS — J302 Other seasonal allergic rhinitis: Secondary | ICD-10-CM | POA: Diagnosis present

## 2018-12-11 DIAGNOSIS — I158 Other secondary hypertension: Secondary | ICD-10-CM | POA: Diagnosis not present

## 2018-12-11 DIAGNOSIS — F419 Anxiety disorder, unspecified: Secondary | ICD-10-CM | POA: Diagnosis present

## 2018-12-11 DIAGNOSIS — R180 Malignant ascites: Secondary | ICD-10-CM | POA: Diagnosis present

## 2018-12-11 DIAGNOSIS — J90 Pleural effusion, not elsewhere classified: Secondary | ICD-10-CM | POA: Diagnosis not present

## 2018-12-11 DIAGNOSIS — Z8042 Family history of malignant neoplasm of prostate: Secondary | ICD-10-CM

## 2018-12-11 DIAGNOSIS — Z20828 Contact with and (suspected) exposure to other viral communicable diseases: Secondary | ICD-10-CM | POA: Diagnosis present

## 2018-12-11 DIAGNOSIS — Z880 Allergy status to penicillin: Secondary | ICD-10-CM

## 2018-12-11 LAB — CBC
HCT: 41.9 % (ref 39.0–52.0)
Hemoglobin: 12.9 g/dL — ABNORMAL LOW (ref 13.0–17.0)
MCH: 27.5 pg (ref 26.0–34.0)
MCHC: 30.8 g/dL (ref 30.0–36.0)
MCV: 89.3 fL (ref 80.0–100.0)
Platelets: 494 10*3/uL — ABNORMAL HIGH (ref 150–400)
RBC: 4.69 MIL/uL (ref 4.22–5.81)
RDW: 14.9 % (ref 11.5–15.5)
WBC: 13.2 10*3/uL — ABNORMAL HIGH (ref 4.0–10.5)
nRBC: 0 % (ref 0.0–0.2)

## 2018-12-11 LAB — COMPREHENSIVE METABOLIC PANEL
ALT: 15 U/L (ref 0–44)
AST: 22 U/L (ref 15–41)
Albumin: 3.4 g/dL — ABNORMAL LOW (ref 3.5–5.0)
Alkaline Phosphatase: 72 U/L (ref 38–126)
Anion gap: 14 (ref 5–15)
BUN: 17 mg/dL (ref 6–20)
CO2: 21 mmol/L — ABNORMAL LOW (ref 22–32)
Calcium: 9 mg/dL (ref 8.9–10.3)
Chloride: 102 mmol/L (ref 98–111)
Creatinine, Ser: 1.32 mg/dL — ABNORMAL HIGH (ref 0.61–1.24)
GFR calc Af Amer: >60 mL/min (ref >60)
GFR calc non Af Amer: >60 mL/min (ref >60)
Glucose, Bld: 141 mg/dL — ABNORMAL HIGH (ref 70–99)
Potassium: 4.1 mmol/L (ref 3.5–5.1)
Sodium: 137 mmol/L (ref 135–145)
Total Bilirubin: 0.9 mg/dL (ref 0.3–1.2)
Total Protein: 6.7 g/dL (ref 6.5–8.1)

## 2018-12-11 LAB — LACTIC ACID, PLASMA: Lactic Acid, Venous: 0.8 mmol/L (ref 0.5–1.9)

## 2018-12-11 LAB — LIPASE, BLOOD: Lipase: 23 U/L (ref 11–51)

## 2018-12-11 MED ORDER — PROCHLORPERAZINE MALEATE 10 MG PO TABS
10.0000 mg | ORAL_TABLET | Freq: Four times a day (QID) | ORAL | Status: DC | PRN
  Filled 2018-12-11: qty 1

## 2018-12-11 MED ORDER — DEXTROSE-NACL 5-0.9 % IV SOLN
INTRAVENOUS | Status: DC
  Administered 2018-12-12 – 2018-12-14 (×4): via INTRAVENOUS

## 2018-12-11 MED ORDER — FENTANYL 50 MCG/HR TD PT72
1.0000 | MEDICATED_PATCH | TRANSDERMAL | Status: DC
  Administered 2018-12-13: 1 via TRANSDERMAL
  Filled 2018-12-11: qty 1

## 2018-12-11 MED ORDER — LORAZEPAM 1 MG PO TABS
1.0000 mg | ORAL_TABLET | Freq: Every day | ORAL | Status: DC
  Administered 2018-12-12 – 2018-12-13 (×3): 1 mg via ORAL
  Filled 2018-12-11 (×4): qty 1

## 2018-12-11 MED ORDER — ONDANSETRON HCL 4 MG PO TABS: 4.0000 mg | ORAL_TABLET | Freq: Four times a day (QID) | ORAL | Status: DC | PRN

## 2018-12-11 MED ORDER — SENNOSIDES-DOCUSATE SODIUM 8.6-50 MG PO TABS: 2.0000 | ORAL_TABLET | ORAL | Status: DC | PRN

## 2018-12-11 MED ORDER — ALBUTEROL SULFATE HFA 108 (90 BASE) MCG/ACT IN AERS: 1.0000 | INHALATION_SPRAY | RESPIRATORY_TRACT | Status: DC | PRN

## 2018-12-11 MED ORDER — BUDESONIDE 0.25 MG/2ML IN SUSP
0.2500 mg | Freq: Two times a day (BID) | RESPIRATORY_TRACT | Status: DC
  Administered 2018-12-12 – 2018-12-14 (×5): 0.25 mg via RESPIRATORY_TRACT
  Filled 2018-12-11 (×5): qty 2

## 2018-12-11 MED ORDER — ONDANSETRON HCL 4 MG/2ML IJ SOLN
4.0000 mg | Freq: Once | INTRAMUSCULAR | Status: AC
  Administered 2018-12-11: 4 mg via INTRAVENOUS
  Filled 2018-12-11: qty 2

## 2018-12-11 MED ORDER — ZOLPIDEM TARTRATE 5 MG PO TABS
5.0000 mg | ORAL_TABLET | Freq: Every evening | ORAL | Status: DC | PRN
  Administered 2018-12-12 – 2018-12-13 (×2): 5 mg via ORAL
  Filled 2018-12-11 (×2): qty 1

## 2018-12-11 MED ORDER — IOHEXOL 300 MG/ML  SOLN
100.0000 mL | Freq: Once | INTRAMUSCULAR | Status: AC | PRN
  Administered 2018-12-11: 100 mL via INTRAVENOUS

## 2018-12-11 MED ORDER — NALOXONE HCL 4 MG/0.1ML NA LIQD
1.0000 | Freq: Once | NASAL | Status: DC | PRN
  Filled 2018-12-11: qty 8

## 2018-12-11 MED ORDER — CYCLOBENZAPRINE HCL 5 MG PO TABS: 5.0000 mg | ORAL_TABLET | Freq: Three times a day (TID) | ORAL | Status: DC | PRN

## 2018-12-11 MED ORDER — ALBUTEROL SULFATE (2.5 MG/3ML) 0.083% IN NEBU
2.5000 mg | INHALATION_SOLUTION | RESPIRATORY_TRACT | Status: DC | PRN
  Administered 2018-12-12: 2.5 mg via RESPIRATORY_TRACT
  Filled 2018-12-11: qty 3

## 2018-12-11 MED ORDER — TRIFLURIDINE-TIPIRACIL 20-8.19 MG PO TABS: 35.0000 mg/m2 | ORAL_TABLET | Freq: Two times a day (BID) | ORAL | Status: DC

## 2018-12-11 MED ORDER — SODIUM CHLORIDE 0.9 % IV BOLUS
1000.0000 mL | Freq: Once | INTRAVENOUS | Status: AC
  Administered 2018-12-11: 1000 mL via INTRAVENOUS

## 2018-12-11 MED ORDER — GABAPENTIN 300 MG PO CAPS: 300.0000 mg | ORAL_CAPSULE | Freq: Three times a day (TID) | ORAL | Status: DC | PRN

## 2018-12-11 MED ORDER — MORPHINE SULFATE (PF) 4 MG/ML IV SOLN
4.0000 mg | Freq: Once | INTRAVENOUS | Status: AC
  Administered 2018-12-11: 4 mg via INTRAVENOUS
  Filled 2018-12-11: qty 1

## 2018-12-11 MED ORDER — FENTANYL 25 MCG/HR TD PT72
1.0000 | MEDICATED_PATCH | TRANSDERMAL | Status: DC
  Administered 2018-12-13: 1 via TRANSDERMAL
  Filled 2018-12-11: qty 1

## 2018-12-11 MED ORDER — POLYETHYLENE GLYCOL 3350 17 G PO PACK: 17.0000 g | PACK | Freq: Every day | ORAL | Status: DC | PRN

## 2018-12-11 MED ORDER — AMLODIPINE BESYLATE 10 MG PO TABS
10.0000 mg | ORAL_TABLET | Freq: Every day | ORAL | Status: DC
  Administered 2018-12-12 – 2018-12-14 (×3): 10 mg via ORAL
  Filled 2018-12-11: qty 2
  Filled 2018-12-11 (×2): qty 1
  Filled 2018-12-11: qty 2

## 2018-12-11 MED ORDER — OXYCODONE HCL 5 MG PO TABS
20.0000 mg | ORAL_TABLET | Freq: Two times a day (BID) | ORAL | Status: DC
  Administered 2018-12-12 – 2018-12-14 (×6): 20 mg via ORAL
  Filled 2018-12-11 (×6): qty 4

## 2018-12-11 MED ORDER — ONDANSETRON HCL 4 MG/2ML IJ SOLN
4.0000 mg | Freq: Four times a day (QID) | INTRAMUSCULAR | Status: DC | PRN
  Administered 2018-12-14: 4 mg via INTRAVENOUS
  Filled 2018-12-11: qty 2

## 2018-12-11 MED ORDER — HEPARIN SODIUM (PORCINE) 5000 UNIT/ML IJ SOLN
5000.0000 [IU] | Freq: Three times a day (TID) | INTRAMUSCULAR | Status: DC
  Administered 2018-12-12 – 2018-12-14 (×7): 5000 [IU] via SUBCUTANEOUS
  Filled 2018-12-11 (×9): qty 1

## 2018-12-11 MED ORDER — FLUTICASONE PROPIONATE HFA 110 MCG/ACT IN AERO: 2.0000 | INHALATION_SPRAY | Freq: Two times a day (BID) | RESPIRATORY_TRACT | Status: DC

## 2018-12-11 MED ORDER — METRONIDAZOLE IN NACL 5-0.79 MG/ML-% IV SOLN
500.0000 mg | Freq: Three times a day (TID) | INTRAVENOUS | Status: DC
  Administered 2018-12-12 – 2018-12-14 (×8): 500 mg via INTRAVENOUS
  Filled 2018-12-11 (×8): qty 100

## 2018-12-11 MED ORDER — VENLAFAXINE HCL ER 150 MG PO CP24
150.0000 mg | ORAL_CAPSULE | Freq: Every day | ORAL | Status: DC
  Administered 2018-12-12 – 2018-12-14 (×3): 150 mg via ORAL
  Filled 2018-12-11 (×2): qty 1
  Filled 2018-12-11: qty 2

## 2018-12-11 MED ORDER — ADULT MULTIVITAMIN W/MINERALS CH
1.0000 | ORAL_TABLET | Freq: Every day | ORAL | Status: DC
  Administered 2018-12-12 – 2018-12-14 (×3): 1 via ORAL
  Filled 2018-12-11 (×3): qty 1

## 2018-12-11 MED ORDER — ONDANSETRON 4 MG PO TBDP: 8.0000 mg | ORAL_TABLET | Freq: Three times a day (TID) | ORAL | Status: DC | PRN

## 2018-12-11 MED ORDER — LEVOFLOXACIN IN D5W 750 MG/150ML IV SOLN
750.0000 mg | Freq: Every day | INTRAVENOUS | Status: DC
  Administered 2018-12-12 – 2018-12-13 (×3): 750 mg via INTRAVENOUS
  Filled 2018-12-11 (×3): qty 150

## 2018-12-11 NOTE — ED Triage Notes (Signed)
Pt c/o diarrhea since yesterday, heart racing and felling weak. Was seen here last week for PNA.

## 2018-12-11 NOTE — Telephone Encounter (Signed)
Patient calls stating he had thoracentesis on Monday drained 2 liters, feels like he is feeling back up and wants to know if he can have it done again tomorrow or Friday.  Has appointment with [name] tomorrow and wants to talk about his pain - he has cut back on his pain medication due to constipation, also confused about the clinical trials and [place].     [phone]

## 2018-12-11 NOTE — ED Provider Notes (Signed)
[facility] DEPT Provider Note   CSN: [id number] Arrival date & time: 12/11/18  [place]    History   Chief Complaint Chief Complaint  Patient presents with   Diarrhea   Tachycardia    HPI Nicholas Lowery is a 58 y.o. male.     HPI  58 year old male with a history of metastatic colon cancer, hypertension, anxiety, recent hospital admission with concern for sepsis secondary to nosocomial pneumonia with associated pleural effusion, AKI who presents with concern for diarrhea, heart racing, generalized weakness.  Reports since 2-3PM has had diarrhea, has had 7 episodes since 2PM today Monday night began to have some problems Was constipated and had been taking senokot and miralax  Having abdominal pain starting today Dehydration Breathing has been shallow and heart rate high  Normally will take 20mg  oxycodone twice a day, had one dose today Used to take 30-40mg  TID, last week began taking less because of constipation, last dose of senokot/miralax wsa yesterday  No vomiting, no fevers, no black or bloody stools    Past Medical History:  Diagnosis Date   Anxiety    Cancer of ascending colon ([place])    Hypertension    Seasonal allergies     Patient Active Problem List   Diagnosis Date Noted   Enteritis of infectious origin 12/11/2018   HCAP (healthcare-associated pneumonia) 11/25/2018   Pleural effusion    Sepsis ([place]) 11/24/2018   Anxiety and depression 10/01/2018   DNR (do not resuscitate) 09/04/2018   Cancer related pain 08/30/2018   Advanced care planning/counseling discussion    Palliative care by specialist    Anxiety associated with cancer diagnosis    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   HTN (hypertension) 09/06/2016   Port catheter in place 08/10/2016   Iron deficiency anemia due to chronic blood loss 07/05/2016   Goals of care, counseling/discussion 07/05/2016   Diarrhea  06/06/2016   Elevated blood pressure reading 06/06/2016   Metastatic colon cancer to liver ([place]) 06/01/2016    Past Surgical History:  Procedure Laterality Date   APPENDECTOMY  1992   CATARACT EXTRACTION W/ INTRAOCULAR LENS IMPLANT Left 04/2016   COLON SURGERY     COLONOSCOPY W/ BIOPSIES AND POLYPECTOMY  04/2015   ESOPHAGOGASTRODUODENOSCOPY (EGD) WITH PROPOFOL N/A 11/27/2018   Procedure: ESOPHAGOGASTRODUODENOSCOPY (EGD) WITH PROPOFOL;  Surgeon: Wonda Horner, MD;  Location: [facility];  Service: Endoscopy;  Laterality: N/A;   INGUINAL HERNIA REPAIR Right 1982   IR RADIOLOGIST EVAL & MGMT  01/16/2017   IR THORACENTESIS ASP PLEURAL SPACE W/IMG GUIDE  12/09/2018   KNEE ARTHROSCOPY W/ MENISCECTOMY Right 1998   LAPAROSCOPIC RIGHT COLECTOMY N/A 06/14/2016   Procedure: LAPAROSCOPIC HAND ASSISTED HEMICOLECTOMY AND SMALL BOWEL RESECTION.;  Surgeon: [name], MD;  Location: [facility];  Service: General;  Laterality: N/A;   LIVER BIOPSY Right 06/14/2016   Procedure: LIVER BIOPSY;  Surgeon: [name], MD;  Location: [facility];  Service: General;  Laterality: Right;  Right Inferior Liver   PORTACATH PLACEMENT N/A 07/06/2016   Procedure: INSERTION PORT-A-CATH;  Surgeon: [name], MD;  Location: [facility];  Service: General;  Laterality: N/A;   VASECTOMY  2005        Home Medications    Prior to Admission medications   Medication Sig Start Date End Date Taking? Authorizing Provider  albuterol (VENTOLIN HFA) 108 (90 Base) MCG/ACT inhaler Inhale 1-2 puffs into the lungs every 6 (six) hours as needed for wheezing or shortness of  breath. Patient taking differently: Inhale 1-2 puffs into the lungs every 4 (four) hours as needed for wheezing or shortness of breath.  09/11/18  Yes Alla Feeling, NP  amLODipine (NORVASC) 10 MG tablet Take 1 tablet (10 mg total) by mouth daily. 11/18/18  Yes Truitt Merle, MD  cyclobenzaprine (FLEXERIL) 5 MG tablet Take 1 tablet (5 mg total) by  mouth 3 (three) times daily as needed for muscle spasms. 11/19/18  Yes Tanner, Lyndon Code., PA-C  fentaNYL (DURAGESIC) 25 MCG/HR Place 1 patch onto the skin every other day. In addition to 73mcg/hr patch every 48 hours 12/05/18  Yes Truitt Merle, MD  fentaNYL (DURAGESIC) 50 MCG/HR Place 1 patch onto the skin every other day. 11/14/18  Yes Truitt Merle, MD  fluticasone (FLOVENT HFA) 110 MCG/ACT inhaler Inhale 2 puffs into the lungs 2 (two) times daily. Patient taking differently: Inhale 2 puffs into the lungs 2 (two) times a day.  11/19/18  Yes Tanner, Lyndon Code., PA-C  gabapentin (NEURONTIN) 300 MG capsule TAKE ONE CAPSULE BY MOUTH THREE TIMES DAILY Patient taking differently: Take 300 mg by mouth 3 (three) times daily as needed (nerve pain).  10/23/18  Yes Truitt Merle, MD  LORazepam (ATIVAN) 1 MG tablet Take 1 tablet (1 mg total) by mouth every 4 (four) hours as needed for anxiety or sleep (nausea). Patient taking differently: Take 1 mg by mouth at bedtime.  11/28/18  Yes Truitt Merle, MD  Multiple Vitamin (MULTIVITAMIN WITH MINERALS) TABS tablet Take 1 tablet by mouth daily.   Yes [provider]  ondansetron (ZOFRAN ODT) 8 MG disintegrating tablet Take 1 tablet (8 mg total) by mouth every 8 (eight) hours as needed for nausea or vomiting. 12/05/18  Yes Truitt Merle, MD  Oxycodone HCl 10 MG TABS Take 1-2 tablets (10-20 mg total) by mouth every 6 (six) hours as needed. Patient taking differently: Take 20 mg by mouth 2 (two) times a day.  11/14/18  Yes Truitt Merle, MD  polyethylene glycol (MIRALAX / GLYCOLAX) 17 g packet Take 17 g by mouth daily. Patient taking differently: Take 17 g by mouth daily as needed for mild constipation.  08/29/18  Yes Domenic Moras, PA-C  prochlorperazine (COMPAZINE) 10 MG tablet Take 1 tablet (10 mg total) by mouth every 6 (six) hours as needed for nausea or vomiting. 11/29/18  Yes Tanner, Lyndon Code., PA-C  senna-docusate (SENOKOT-S) 8.6-50 MG tablet Take 2 tablets by mouth 2 (two) times daily. Patient  taking differently: Take 2 tablets by mouth as needed for mild constipation.  09/03/18  Yes Norval Morton, MD  venlafaxine XR (EFFEXOR-XR) 150 MG 24 hr capsule TAKE ONE CAPSULE BY MOUTH DAILY WITH BREAKFAST Patient taking differently: Take 150 mg by mouth daily with breakfast.  11/18/18  Yes Truitt Merle, MD  zolpidem (AMBIEN CR) 12.5 MG CR tablet Take 1 tablet (12.5 mg total) by mouth at bedtime. 12/05/18  Yes Truitt Merle, MD  furosemide (LASIX) 20 MG tablet Take 0.5 tablets (10 mg total) by mouth daily. 12/05/18   Truitt Merle, MD  losartan (COZAAR) 100 MG tablet TAKE ONE TABLET BY MOUTH EVERY DAY Patient not taking: No sig reported 11/18/18   Truitt Merle, MD  naloxone [facility]) nasal spray 4 mg/0.1 mL Place 1 spray into the nose once as needed (emergency).  11/28/18   [provider]  ondansetron (ZOFRAN) 4 MG tablet Take 1 tablet (4 mg total) by mouth every 6 (six) hours as needed for nausea. 12/05/18   Burr Medico,  Krista Blue, MD  trifluridine-tipiracil (LONSURF) 20-8.19 MG tablet Take 4 tabs (80mg  trifluridine) PO in AM & 3 tabs (60mg  trifluridine) in PM, immediately after food on days 1-5 & 8-12 of each 28d cycle 11/18/18   Truitt Merle, MD    Family History Family History  Problem Relation Age of Onset   Cancer Mother        lung cancer   Stroke Mother    Hypertension Father    CAD Father    Cancer Maternal Grandfather        prostate cancer     Social History Social History   Tobacco Use   Smoking status: Former Smoker    Years: 2.00    Types: Cigarettes    Quit date: 1990    Years since quitting: 30.5   Smokeless tobacco: Never Used  Substance Use Topics   Alcohol use: Yes    Alcohol/week: 20.0 standard drinks    Types: 20 Cans of beer per week    Comment: pt states he drinks about 3-4 beers per night   Drug use: No    Comment: 06/15/2016 "nothing since college"     Allergies   Penicillins, Xeloda [capecitabine], and Nivolumab   Review of Systems Review of Systems    Constitutional: Positive for appetite change and fatigue. Negative for fever.  HENT: Negative for sore throat.   Eyes: Negative for visual disturbance.  Respiratory: Negative for cough and shortness of breath.   Cardiovascular: Negative for chest pain.  Gastrointestinal: Positive for abdominal pain (central abdomen and to right an dleft), diarrhea and nausea. Negative for vomiting.  Genitourinary: Negative for difficulty urinating.  Musculoskeletal: Positive for back pain (middle of back on the right). Negative for neck stiffness.  Skin: Negative for rash.  Neurological: Negative for syncope, light-headedness and headaches.     Physical Exam Updated Vital Signs BP (!) 113/95    Pulse (!) 105    Temp 97.6 F (36.4 C) (Oral)    Resp 18    SpO2 97%   Physical Exam Vitals signs and nursing note reviewed.  Constitutional:      General: He is not in acute distress.    Appearance: He is well-developed. He is not diaphoretic.  HENT:     Head: Normocephalic and atraumatic.  Eyes:     Conjunctiva/sclera: Conjunctivae normal.  Neck:     Musculoskeletal: Normal range of motion.  Cardiovascular:     Rate and Rhythm: Regular rhythm. Tachycardia present.  Pulmonary:     Effort: Pulmonary effort is normal. No respiratory distress.  Abdominal:     General: There is no distension.     Palpations: Abdomen is soft.     Tenderness: There is abdominal tenderness (tenderness diffuse, worse over umbilicus with nodularity, question hernia). There is no guarding.  Skin:    General: Skin is warm and dry.  Neurological:     Mental Status: He is alert and oriented to person, place, and time.      ED Treatments / Results  Labs (all labs ordered are listed, but only abnormal results are displayed) Labs Reviewed  COMPREHENSIVE METABOLIC PANEL - Abnormal; Notable for the following components:      Result Value   CO2 21 (*)    Glucose, Bld 141 (*)    Creatinine, Ser 1.32 (*)    Albumin 3.4 (*)     All other components within normal limits  CBC - Abnormal; Notable for the following components:   WBC 13.2 (*)  Hemoglobin 12.9 (*)    Platelets 494 (*)    All other components within normal limits  URINALYSIS, ROUTINE W REFLEX MICROSCOPIC - Abnormal; Notable for the following components:   Specific Gravity, Urine >1.046 (*)    Ketones, ur 5 (*)    All other components within normal limits  CBC - Abnormal; Notable for the following components:   RBC 3.95 (*)    Hemoglobin 11.2 (*)    HCT 35.8 (*)    All other components within normal limits  CREATININE, SERUM - Abnormal; Notable for the following components:   Creatinine, Ser 1.27 (*)    All other components within normal limits  COMPREHENSIVE METABOLIC PANEL - Abnormal; Notable for the following components:   Glucose, Bld 108 (*)    Creatinine, Ser 1.33 (*)    Calcium 8.1 (*)    Total Protein 5.5 (*)    Albumin 2.7 (*)    GFR calc non Af Amer 60 (*)    All other components within normal limits  CBC - Abnormal; Notable for the following components:   RBC 3.83 (*)    Hemoglobin 11.0 (*)    HCT 34.9 (*)    All other components within normal limits  SARS CORONAVIRUS 2 (HOSPITAL ORDER, [facility])  C DIFFICILE QUICK SCREEN W PCR REFLEX  GASTROINTESTINAL PANEL BY PCR, STOOL (REPLACES STOOL CULTURE)  LIPASE, BLOOD  LACTIC ACID, PLASMA  LACTIC ACID, PLASMA    EKG EKG Interpretation  Date/Time:  Wednesday December 11 2018 19:36:44 EDT Ventricular Rate:  104 PR Interval:    QRS Duration: 86 QT Interval:  340 QTC Calculation: 448 R Axis:   68 Text Interpretation:  Sinus tachycardia Low voltage, extremity leads No significant change since last tracing Confirmed by Gareth Morgan [phone]) on 12/11/2018 7:47:55 PM Also confirmed by Gareth Morgan [phone]), editor Hattie Perch ([phone])  on 12/12/2018 6:38:33 AM   Radiology Ct Abdomen Pelvis W Contrast  Result Date: 12/11/2018 CLINICAL  DATA:  Diarrhea EXAM: CT ABDOMEN AND PELVIS WITH CONTRAST TECHNIQUE: Multidetector CT imaging of the abdomen and pelvis was performed using the standard protocol following bolus administration of intravenous contrast. CONTRAST:  162mL OMNIPAQUE IOHEXOL 300 MG/ML  SOLN COMPARISON:  CT 11/24/2018, 08/29/2018 FINDINGS: Lower chest: Lung bases demonstrate moderate right-sided pleural effusion. Innumerable metastatic pulmonary nodules are again visualized. Peribronchial thickening at the right middle lobe and right lung base. Partial atelectasis or pneumonia at the right base. Hepatobiliary: Multiple hypodense liver nodules consistent with metastatic disease. Index lesion in the inferior right hepatic lobe measures 5.5 cm, compared with 4.3 cm previously. Increased conspicuity of other metastatic nodules in the liver. No calcified gallstone or biliary dilatation Pancreas: Unremarkable. No pancreatic ductal dilatation or surrounding inflammatory changes. Spleen: Normal in size without focal abnormality. Adrenals/Urinary Tract: Adrenal glands are normal. Kidneys show no hydronephrosis. The bladder is unremarkable Stomach/Bowel: The stomach is nonenlarged. Abnormal segmental small bowel thickening in the right hemiabdomen. No intramural air. Status post partial colectomy Vascular/Lymphatic: Nonaneurysmal aorta. Stable subcentimeter retroperitoneal nodes. Increased intraperitoneal nodules near the umbilical region, measuring up to 14 mm. Reproductive: Slightly enlarged prostate with calcification Other: No free air. Moderate to large volume of ascites in the abdomen and pelvis, increased compared to prior. Ill-defined soft tissue density within the central mesentery adjacent to surgical anastomosis. This appears to encase the distal SMA and SMV. Musculoskeletal: No acute or suspicious osseous abnormality. IMPRESSION: 1. Segmental small bowel wall thickening within the right hemiabdomen which  may be secondary to infection or  ischemia, less likely inflammatory bowel disease. Bowel wall thickening appears worse when compared to the most recent CT from November 24, 2018. The segment of thickened small bowel is also fluid filled and there may be a degree of obstruction, possibly related to surgical anastomosis. 2. Moderate right pleural effusion. Numerous metastatic pulmonary nodules as before. 3. Increased size of inferior right hepatic lobe metastatic lesion with increased consult conspicuity of additional hypodense liver lesions, consistent with progression of metastatic disease. 4. Ill-defined mesenteric soft tissue mass near the surgical anastomosis, with encasement of the distal SMA and SMV vessels 5. Moderate to large volume of ascites within the abdomen and pelvis, may be slightly increased. Increased peritoneal nodularity near the umbilicus Electronically Signed   By: Donavan Foil M.D.   On: 12/11/2018 22:25    Procedures Procedures (including critical care time)  Medications Ordered in ED Medications  multivitamin with minerals tablet 1 tablet (1 tablet Oral Given 12/12/18 1115)  polyethylene glycol (MIRALAX / GLYCOLAX) packet 17 g (has no administration in time range)  senna-docusate (Senokot-S) tablet 2 tablet (has no administration in time range)  gabapentin (NEURONTIN) capsule 300 mg (has no administration in time range)  fentaNYL (DURAGESIC) 50 MCG/HR 1 patch (has no administration in time range)  oxyCODONE (Oxy IR/ROXICODONE) immediate release tablet 20 mg (20 mg Oral Given 12/12/18 0828)  amLODipine (NORVASC) tablet 10 mg (10 mg Oral Given 12/12/18 1115)  venlafaxine XR (EFFEXOR-XR) 24 hr capsule 150 mg (150 mg Oral Given 12/12/18 0828)  cyclobenzaprine (FLEXERIL) tablet 5 mg (has no administration in time range)  LORazepam (ATIVAN) tablet 1 mg (1 mg Oral Given 12/12/18 0108)  prochlorperazine (COMPAZINE) tablet 10 mg (has no administration in time range)  naloxone (NARCAN) nasal spray 4 mg/0.1 mL (has no  administration in time range)  zolpidem (AMBIEN) tablet 5 mg (5 mg Oral Given 12/12/18 0120)  fentaNYL (DURAGESIC) 25 MCG/HR 1 patch (has no administration in time range)  ondansetron (ZOFRAN) tablet 4 mg (has no administration in time range)  ondansetron (ZOFRAN-ODT) disintegrating tablet 8 mg (has no administration in time range)  heparin injection 5,000 Units (5,000 Units Subcutaneous Given 12/12/18 0537)  dextrose 5 %-0.9 % sodium chloride infusion ( Intravenous Rate/Dose Verify 12/12/18 1147)  ondansetron (ZOFRAN) tablet 4 mg (has no administration in time range)    Or  ondansetron (ZOFRAN) injection 4 mg (has no administration in time range)  levofloxacin (LEVAQUIN) IVPB 750 mg (0 mg Intravenous Stopped 12/12/18 0514)  metroNIDAZOLE (FLAGYL) IVPB 500 mg (0 mg Intravenous Stopped 12/12/18 0633)  budesonide (PULMICORT) nebulizer solution 0.25 mg (0.25 mg Nebulization Given 12/12/18 0828)  albuterol (PROVENTIL) (2.5 MG/3ML) 0.083% nebulizer solution 2.5 mg (has no administration in time range)  sodium chloride 0.9 % bolus 1,000 mL (0 mLs Intravenous Stopped 12/11/18 2307)  morphine 4 MG/ML injection 4 mg (4 mg Intravenous Given 12/11/18 2045)  ondansetron (ZOFRAN) injection 4 mg (4 mg Intravenous Given 12/11/18 2045)  iohexol (OMNIPAQUE) 300 MG/ML solution 100 mL (100 mLs Intravenous Contrast Given 12/11/18 2142)  sodium chloride 0.9 % bolus 1,000 mL (0 mLs Intravenous Stopped 12/12/18 0223)     Initial Impression / Assessment and Plan / ED Course  I have reviewed the triage vital signs and the nursing notes.  Pertinent labs & imaging results that were available during my care of the patient were reviewed by me and considered in my medical decision making (see chart for details).  58 year old male with a history of metastatic colon cancer, hypertension, anxiety, recent hospital admission with concern for sepsis secondary to nosocomial pneumonia with associated pleural effusion, AKI who  presents with concern for diarrhea, heart racing, generalized weakness.  Given abdominal tenderness, CT obtained which shows worsening inflammation in area of small bowel, fluid filled. Clinically have low suspicion for obstruction as he is passing flatus and not vomiting.  Diarrhea may be mulifactoria. Doubt ischemic given duration of inflammation present. May be infectious, consider CDiff given recent abx. Also consider diarrhea secondary to withdrawal from opiates given recent decrease in dose as well as use of laxatives.  Sinus tachycardia persistent in the ED despite fluids.  Will admit for continued hydration and monitoring.     Final Clinical Impressions(s) / ED Diagnoses   Final diagnoses:  Enteritis  Tachycardia    ED Discharge Orders    None       Gareth Morgan, MD 12/12/18 1501

## 2018-12-11 NOTE — Telephone Encounter (Signed)
Telephone  12/11/2018 Conversation: Advice Only (Newest Message First) M  12/11/18 3:24 PM Note   Patient calls stating he had thoracentesis on Monday drained 2 liters, feels like he is feeling back up and wants to know if he can have it done again tomorrow or Friday.  Has appointment with [name] tomorrow and wants to talk about his pain - he has cut back on his pain medication due to constipation, also confused about the clinical trials and Lonsurf.     [phone]

## 2018-12-11 NOTE — Telephone Encounter (Signed)
I will place thoracentesis order. Malachy Mood, could you call IR to schedule for him? Thanks   Truitt Merle MD

## 2018-12-11 NOTE — H&P (Signed)
History and Physical   Nicholas Lowery UYQ:[id number] DOB: January 15, 1961 DOA: 12/11/2018  Referring MD/NP/PA: Dr. Billy Fischer  PCP: Curlene Labrum, MD   Outpatient Specialists: [name], oncology, Dr. Penelope Coop, gastroenterology  Patient coming from: Home  Chief Complaint: Diarrhea  HPI: Nicholas Lowery is a 58 y.o. male with medical history significant of colon cancer with metastasis to the liver, hypertension, anxiety disorder, recurrent diarrhea as well as chronic pain from cancer who presented to the ER today with diarrhea and generalized weakness.  Patient has been on high-dose narcotics and apparently been constipated for a while.  He has been taking laxatives lately to relieve his constipation.  He however started having spirits diarrhea yesterday which has not stopped.  He has not taken any laxatives today.  Patient also has weakness and not even adequately.  He was seen in the ER tachypneic with CT scan showing possible right sided colitis.  He was having 7 out of 10 abdominal pain localized to the area.  Patient is therefore being admitted for colitis questionable infectious versus ischemic.  He is afebrile..  ED Course: Temperature 97.6 blood pressure 138/103, pulse 139, respiratory of 24 and oxygen sat 96% on room air.  White count is 13.2, hemoglobin 12.9 platelets 494.  Sodium 137 potassium 4.0 chloride 102 CO2 21 creatinine 1.32 BUN 17 calcium 9.0 glucose 141.  CT abdomen pelvis showed segmental small bowel wall thickening within the right hemiabdomen which may be secondary to infection or ischemia less likely inflammatory bowel disease.  Patient is therefore being admitted with enteritis.  Review of Systems: As per HPI otherwise 10 point review of systems negative.    Past Medical History:  Diagnosis Date  . Anxiety   . Cancer of ascending colon ([place])   . Hypertension   . Seasonal allergies     Past Surgical History:  Procedure Laterality Date  . APPENDECTOMY  1992   . CATARACT EXTRACTION W/ INTRAOCULAR LENS IMPLANT Left 04/2016  . COLON SURGERY    . COLONOSCOPY W/ BIOPSIES AND POLYPECTOMY  04/2015  . ESOPHAGOGASTRODUODENOSCOPY (EGD) WITH PROPOFOL N/A 11/27/2018   Procedure: ESOPHAGOGASTRODUODENOSCOPY (EGD) WITH PROPOFOL;  Surgeon: Wonda Horner, MD;  Location: [facility];  Service: Endoscopy;  Laterality: N/A;  . INGUINAL HERNIA REPAIR Right 1982  . IR RADIOLOGIST EVAL & MGMT  01/16/2017  . IR THORACENTESIS ASP PLEURAL SPACE W/IMG GUIDE  12/09/2018  . KNEE ARTHROSCOPY W/ MENISCECTOMY Right 1998  . LAPAROSCOPIC RIGHT COLECTOMY N/A 06/14/2016   Procedure: LAPAROSCOPIC HAND ASSISTED HEMICOLECTOMY AND SMALL BOWEL RESECTION.;  Surgeon: [name], MD;  Location: [place];  Service: General;  Laterality: N/A;  . LIVER BIOPSY Right 06/14/2016   Procedure: LIVER BIOPSY;  Surgeon: [name], MD;  Location: [place];  Service: General;  Laterality: Right;  Right Inferior Liver  . PORTACATH PLACEMENT N/A 07/06/2016   Procedure: INSERTION PORT-A-CATH;  Surgeon: [name], MD;  Location: [place];  Service: General;  Laterality: N/A;  . VASECTOMY  2005     reports that he quit smoking about 30 years ago. His smoking use included cigarettes. He quit after 2.00 years of use. He has never used smokeless tobacco. He reports current alcohol use of about 20.0 standard drinks of alcohol per week. He reports that he does not use drugs.  Allergies  Allergen Reactions  . Penicillins Other (See Comments)    UNSPECIFIED REACTION   Has patient had a PCN reaction causing immediate rash, facial/tongue/throat swelling, SOB or lightheadedness with  hypotension: unknown Has patient had a PCN reaction causing severe rash involving mucus membranes or skin necrosis: unknown Has patient had a PCN reaction that required hospitalization unknown Has patient had a PCN reaction occurring within the last 10 years: No If all of the above answers are "NO", then may proceed  with Cephalosporin use.   [name] [Capecitabine] Nausea And Vomiting and Other (See Comments)    Foot pain, patient couldn't walk, and bleeding gums  . Nivolumab Nausea And Vomiting, Palpitations and Other (See Comments)    Infusion reaction: Chills, rigors, numbness. Symptoms improved with ondansetron, clonidine, famotidine, meperidine    Family History  Problem Relation Age of Onset  . Cancer Mother        lung cancer  . Stroke Mother   . Hypertension Father   . CAD Father   . Cancer Maternal Grandfather        prostate cancer      Prior to Admission medications   Medication Sig Start Date End Date Taking? Authorizing Provider  albuterol (VENTOLIN HFA) 108 (90 Base) MCG/ACT inhaler Inhale 1-2 puffs into the lungs every 6 (six) hours as needed for wheezing or shortness of breath. Patient taking differently: Inhale 1-2 puffs into the lungs every 4 (four) hours as needed for wheezing or shortness of breath.  09/11/18  Yes Alla Feeling, NP  amLODipine (NORVASC) 10 MG tablet Take 1 tablet (10 mg total) by mouth daily. 11/18/18  Yes Truitt Merle, MD  cyclobenzaprine (FLEXERIL) 5 MG tablet Take 1 tablet (5 mg total) by mouth 3 (three) times daily as needed for muscle spasms. 11/19/18  Yes Tanner, Lyndon Code., PA-C  fentaNYL (DURAGESIC) 25 MCG/HR Place 1 patch onto the skin every other day. In addition to 71mcg/hr patch every 48 hours 12/05/18  Yes Truitt Merle, MD  fentaNYL (DURAGESIC) 50 MCG/HR Place 1 patch onto the skin every other day. 11/14/18  Yes Truitt Merle, MD  fluticasone (FLOVENT HFA) 110 MCG/ACT inhaler Inhale 2 puffs into the lungs 2 (two) times daily. Patient taking differently: Inhale 2 puffs into the lungs 2 (two) times a day.  11/19/18  Yes Tanner, Lyndon Code., PA-C  gabapentin (NEURONTIN) 300 MG capsule TAKE ONE CAPSULE BY MOUTH THREE TIMES DAILY Patient taking differently: Take 300 mg by mouth 3 (three) times daily as needed (nerve pain).  10/23/18  Yes Truitt Merle, MD  LORazepam (ATIVAN) 1 MG  tablet Take 1 tablet (1 mg total) by mouth every 4 (four) hours as needed for anxiety or sleep (nausea). Patient taking differently: Take 1 mg by mouth at bedtime.  11/28/18  Yes Truitt Merle, MD  Multiple Vitamin (MULTIVITAMIN WITH MINERALS) TABS tablet Take 1 tablet by mouth daily.   Yes [provider]  ondansetron (ZOFRAN ODT) 8 MG disintegrating tablet Take 1 tablet (8 mg total) by mouth every 8 (eight) hours as needed for nausea or vomiting. 12/05/18  Yes Truitt Merle, MD  Oxycodone HCl 10 MG TABS Take 1-2 tablets (10-20 mg total) by mouth every 6 (six) hours as needed. Patient taking differently: Take 20 mg by mouth 2 (two) times a day.  11/14/18  Yes Truitt Merle, MD  polyethylene glycol (MIRALAX / GLYCOLAX) 17 g packet Take 17 g by mouth daily. Patient taking differently: Take 17 g by mouth daily as needed for mild constipation.  08/29/18  Yes Domenic Moras, PA-C  prochlorperazine (COMPAZINE) 10 MG tablet Take 1 tablet (10 mg total) by mouth every 6 (six) hours as needed  for nausea or vomiting. 11/29/18  Yes Tanner, Lyndon Code., PA-C  senna-docusate (SENOKOT-S) 8.6-50 MG tablet Take 2 tablets by mouth 2 (two) times daily. Patient taking differently: Take 2 tablets by mouth as needed for mild constipation.  09/03/18  Yes Norval Morton, MD  venlafaxine XR (EFFEXOR-XR) 150 MG 24 hr capsule TAKE ONE CAPSULE BY MOUTH DAILY WITH BREAKFAST Patient taking differently: Take 150 mg by mouth daily with breakfast.  11/18/18  Yes Truitt Merle, MD  zolpidem (AMBIEN CR) 12.5 MG CR tablet Take 1 tablet (12.5 mg total) by mouth at bedtime. 12/05/18  Yes Truitt Merle, MD  furosemide (LASIX) 20 MG tablet Take 0.5 tablets (10 mg total) by mouth daily. 12/05/18   Truitt Merle, MD  losartan (COZAAR) 100 MG tablet TAKE ONE TABLET BY MOUTH EVERY DAY Patient not taking: No sig reported 11/18/18   Truitt Merle, MD  naloxone [facility]) nasal spray 4 mg/0.1 mL Place 1 spray into the nose once as needed (emergency).  11/28/18   [provider]  ondansetron (ZOFRAN) 4 MG tablet Take 1 tablet (4 mg total) by mouth every 6 (six) hours as needed for nausea. 12/05/18   Truitt Merle, MD  trifluridine-tipiracil (LONSURF) 20-8.19 MG tablet Take 4 tabs (80mg  trifluridine) PO in AM & 3 tabs (60mg  trifluridine) in PM, immediately after food on days 1-5 & 8-12 of each 28d cycle 11/18/18   Truitt Merle, MD    Physical Exam: Vitals:   12/11/18 1930 12/11/18 2052 12/11/18 2100 12/11/18 2130  BP: (!) 116/98 (!) 119/105 (!) 138/103 (!) 127/113  Pulse:  (!) 105 (!) 101 (!) 139  Resp: (!) 24 19 19  (!) 23  Temp:      TempSrc:      SpO2:  96% 96% 98%      Constitutional: Chronically ill looking in mild distress Vitals:   12/11/18 1930 12/11/18 2052 12/11/18 2100 12/11/18 2130  BP: (!) 116/98 (!) 119/105 (!) 138/103 (!) 127/113  Pulse:  (!) 105 (!) 101 (!) 139  Resp: (!) 24 19 19  (!) 23  Temp:      TempSrc:      SpO2:  96% 96% 98%   Eyes: PERRL, lids and conjunctivae normal ENMT: Mucous membranes are dry. Posterior pharynx clear of any exudate or lesions.Normal dentition.  Neck: normal, supple, no masses, no thyromegaly Respiratory: Decreased air entry especially right lung, crackles, rhonchi, no significant wheezing, increase work of breathing.  No accessory muscle use.  Cardiovascular: Tachycardic, no murmurs / rubs / gallops. No extremity edema. 2+ pedal pulses. No carotid bruits.  Abdomen: Distended, tympanitic, diffuse tenderness more on the right flank, evidence of ascites, Bowel sounds positive.  Musculoskeletal: no clubbing / cyanosis. No joint deformity upper and lower extremities. Good ROM, no contractures. Normal muscle tone.  Skin: no rashes, lesions, ulcers. No induration Neurologic: CN 2-12 grossly intact. Sensation intact, DTR normal. Strength 5/5 in all 4.  Psychiatric: Normal judgment and insight. Alert and oriented x 3. Normal mood.     Labs on Admission: I have personally reviewed following labs and imaging studies  CBC:  Recent Labs  Lab 12/11/18 1918  WBC 13.2*  HGB 12.9*  HCT 41.9  MCV 89.3  PLT 841*   Basic Metabolic Panel: Recent Labs  Lab 12/05/18 0812 12/11/18 1918  NA 139 137  K 3.7 4.1  CL 104 102  CO2 25 21*  GLUCOSE 98 141*  BUN 14 17  CREATININE 1.32* 1.32*  CALCIUM 8.9 9.0  GFR: Estimated Creatinine Clearance: 65.3 mL/min (A) (by C-G formula based on SCr of 1.32 mg/dL (H)). Liver Function Tests: Recent Labs  Lab 12/05/18 0812 12/11/18 1918  AST 24 22  ALT 24 15  ALKPHOS 76 72  BILITOT 0.4 0.9  PROT 6.4* 6.7  ALBUMIN 3.0* 3.4*   Recent Labs  Lab 12/11/18 1918  LIPASE 23   No results for input(s): AMMONIA in the last 168 hours. Coagulation Profile: No results for input(s): INR, PROTIME in the last 168 hours. Cardiac Enzymes: No results for input(s): CKTOTAL, CKMB, CKMBINDEX, TROPONINI in the last 168 hours. BNP (last 3 results) No results for input(s): PROBNP in the last 8760 hours. HbA1C: No results for input(s): HGBA1C in the last 72 hours. CBG: No results for input(s): GLUCAP in the last 168 hours. Lipid Profile: No results for input(s): CHOL, HDL, LDLCALC, TRIG, CHOLHDL, LDLDIRECT in the last 72 hours. Thyroid Function Tests: No results for input(s): TSH, T4TOTAL, FREET4, T3FREE, THYROIDAB in the last 72 hours. Anemia Panel: No results for input(s): VITAMINB12, FOLATE, FERRITIN, TIBC, IRON, RETICCTPCT in the last 72 hours. Urine analysis:    Component Value Date/Time   COLORURINE YELLOW 11/24/2018 1700   APPEARANCEUR CLEAR 11/24/2018 1700   LABSPEC 1.014 11/24/2018 1700   PHURINE 5.0 11/24/2018 1700   GLUCOSEU NEGATIVE 11/24/2018 1700   HGBUR NEGATIVE 11/24/2018 1700   BILIRUBINUR NEGATIVE 11/24/2018 1700   KETONESUR NEGATIVE 11/24/2018 1700   PROTEINUR TRACE (A) 12/05/2018 0802   NITRITE NEGATIVE 11/24/2018 1700   LEUKOCYTESUR NEGATIVE 11/24/2018 1700   Sepsis Labs: @LABRCNTIP (procalcitonin:4,lacticidven:4) ) Recent Results (from the past  240 hour(s))  SARS Coronavirus 2 (Performed in [place] hospital lab)     Status: None   Collection Time: 12/07/18  2:33 PM   Specimen: Nasal Swab  Result Value Ref Range Status   SARS Coronavirus 2 NEGATIVE NEGATIVE Final    Comment: (NOTE) SARS-CoV-2 target nucleic acids are NOT DETECTED. The SARS-CoV-2 RNA is generally detectable in upper and lower respiratory specimens during the acute phase of infection. Negative results do not preclude SARS-CoV-2 infection, do not rule out co-infections with other pathogens, and should not be used as the sole basis for treatment or other patient management decisions. Negative results must be combined with clinical observations, patient history, and epidemiological information. The expected result is Negative. Fact Sheet for Patients: SugarRoll.be Fact Sheet for Healthcare Providers: https://www.woods-mathews.com/ This test is not yet approved or cleared by the [place] FDA and  has been authorized for detection and/or diagnosis of SARS-CoV-2 by FDA under an Emergency Use Authorization (EUA). This EUA will remain  in effect (meaning this test can be used) for the duration of the COVID-19 declaration under Section 56 4(b)(1) of the Act, 21 U.S.C. section 360bbb-3(b)(1), unless the authorization is terminated or revoked sooner. Performed at [facility], [place] [address] [place], [place] [address]      Radiological Exams on Admission: Ct Abdomen Pelvis W Contrast  Result Date: 12/11/2018 CLINICAL DATA:  Diarrhea EXAM: CT ABDOMEN AND PELVIS WITH CONTRAST TECHNIQUE: Multidetector CT imaging of the abdomen and pelvis was performed using the standard protocol following bolus administration of intravenous contrast. CONTRAST:  127mL OMNIPAQUE IOHEXOL 300 MG/ML  SOLN COMPARISON:  CT 11/24/2018, 08/29/2018 FINDINGS: Lower chest: Lung bases demonstrate moderate right-sided pleural effusion.  Innumerable metastatic pulmonary nodules are again visualized. Peribronchial thickening at the right middle lobe and right lung base. Partial atelectasis or pneumonia at the right base. Hepatobiliary: Multiple hypodense  liver nodules consistent with metastatic disease. Index lesion in the inferior right hepatic lobe measures 5.5 cm, compared with 4.3 cm previously. Increased conspicuity of other metastatic nodules in the liver. No calcified gallstone or biliary dilatation Pancreas: Unremarkable. No pancreatic ductal dilatation or surrounding inflammatory changes. Spleen: Normal in size without focal abnormality. Adrenals/Urinary Tract: Adrenal glands are normal. Kidneys show no hydronephrosis. The bladder is unremarkable Stomach/Bowel: The stomach is nonenlarged. Abnormal segmental small bowel thickening in the right hemiabdomen. No intramural air. Status post partial colectomy Vascular/Lymphatic: Nonaneurysmal aorta. Stable subcentimeter retroperitoneal nodes. Increased intraperitoneal nodules near the umbilical region, measuring up to 14 mm. Reproductive: Slightly enlarged prostate with calcification Other: No free air. Moderate to large volume of ascites in the abdomen and pelvis, increased compared to prior. Ill-defined soft tissue density within the central mesentery adjacent to surgical anastomosis. This appears to encase the distal SMA and SMV. Musculoskeletal: No acute or suspicious osseous abnormality. IMPRESSION: 1. Segmental small bowel wall thickening within the right hemiabdomen which may be secondary to infection or ischemia, less likely inflammatory bowel disease. Bowel wall thickening appears worse when compared to the most recent CT from November 24, 2018. The segment of thickened small bowel is also fluid filled and there may be a degree of obstruction, possibly related to surgical anastomosis. 2. Moderate right pleural effusion. Numerous metastatic pulmonary nodules as before. 3. Increased size of  inferior right hepatic lobe metastatic lesion with increased consult conspicuity of additional hypodense liver lesions, consistent with progression of metastatic disease. 4. Ill-defined mesenteric soft tissue mass near the surgical anastomosis, with encasement of the distal SMA and SMV vessels 5. Moderate to large volume of ascites within the abdomen and pelvis, may be slightly increased. Increased peritoneal nodularity near the umbilicus Electronically Signed   By: Donavan Foil M.D.   On: 12/11/2018 22:25    Assessment/Plan Principal Problem:   Enteritis of infectious origin Active Problems:   Metastatic colon cancer to liver ([place])   Diarrhea   HTN (hypertension)   DNR (do not resuscitate)   Pleural effusion     #1 acute enteritis: Suspected infectious more than ischemic.  Patient will be admitted for supportive care.  Initiate IV antibiotics.  Consider checking for CAD.  Supportive care.  Pain management will continue with home regimen.  Other stool studies will be pursued.  The use of laxatives may have contributed as well.  #2 metastatic colon cancer: Continue outpatient treatment.  #3 hypertension: Hold ACE inhibitors.  Continue home regimen.  #4 pleural effusion: This is chronic.  He has ascites as well as right pleural effusion which is moderate on the CT.  #5 generalized weakness: Due to chronic disease.  Hydrate and continue monitoring.  #6 dehydration: Hydrate aggressively.  #7 acute kidney injury: Mild worsening.  Hold ACE inhibitors.  Hydrate patient and follow renal function.  DVT prophylaxis: Heparin Code Status: DNR Family Communication: No family at bedside Disposition Plan: To be determined Consults called: None Admission status: Inpatient  Severity of Illness: The appropriate patient status for this patient is INPATIENT. Inpatient status is judged to be reasonable and necessary in order to provide the required intensity of service to ensure the patient's  safety. The patient's presenting symptoms, physical exam findings, and initial radiographic and laboratory data in the context of their chronic comorbidities is felt to place them at high risk for further clinical deterioration. Furthermore, it is not anticipated that the patient will be medically stable for discharge from the hospital within  2 midnights of admission. The following factors support the patient status of inpatient.   " The patient's presenting symptoms include abdominal pain with diarrhea. " The worrisome physical exam findings include evidence of dehydration but also ascites. " The initial radiographic and laboratory data are worrisome because of CT showing enteritis with ascites and pleural effusion. " The chronic co-morbidities include metastatic colon cancer.   * I certify that at the point of admission it is my clinical judgment that the patient will require inpatient hospital care spanning beyond 2 midnights from the point of admission due to high intensity of service, high risk for further deterioration and high frequency of surveillance required.Barbette Merino MD [facility] Pager [phone]  If 7PM-7AM, please contact night-coverage [email or web address] Password [facility]  12/11/2018, 11:26 PM

## 2018-12-12 ENCOUNTER — Inpatient Hospital Stay: Payer: BC Managed Care – PPO

## 2018-12-12 ENCOUNTER — Telehealth: Payer: Self-pay

## 2018-12-12 ENCOUNTER — Inpatient Hospital Stay: Payer: BC Managed Care – PPO | Admitting: Hematology

## 2018-12-12 ENCOUNTER — Other Ambulatory Visit: Payer: BC Managed Care – PPO

## 2018-12-12 DIAGNOSIS — K529 Noninfective gastroenteritis and colitis, unspecified: Secondary | ICD-10-CM

## 2018-12-12 LAB — COMPREHENSIVE METABOLIC PANEL
ALT: 13 U/L (ref 0–44)
AST: 17 U/L (ref 15–41)
Albumin: 2.7 g/dL — ABNORMAL LOW (ref 3.5–5.0)
Alkaline Phosphatase: 61 U/L (ref 38–126)
Anion gap: 9 (ref 5–15)
BUN: 18 mg/dL (ref 6–20)
CO2: 25 mmol/L (ref 22–32)
Calcium: 8.1 mg/dL — ABNORMAL LOW (ref 8.9–10.3)
Chloride: 102 mmol/L (ref 98–111)
Creatinine, Ser: 1.33 mg/dL — ABNORMAL HIGH (ref 0.61–1.24)
GFR calc Af Amer: >60 mL/min (ref >60)
GFR calc non Af Amer: 60 mL/min — ABNORMAL LOW (ref >60)
Glucose, Bld: 108 mg/dL — ABNORMAL HIGH (ref 70–99)
Potassium: 4.3 mmol/L (ref 3.5–5.1)
Sodium: 136 mmol/L (ref 135–145)
Total Bilirubin: 0.6 mg/dL (ref 0.3–1.2)
Total Protein: 5.5 g/dL — ABNORMAL LOW (ref 6.5–8.1)

## 2018-12-12 LAB — URINALYSIS, ROUTINE W REFLEX MICROSCOPIC
Bilirubin Urine: NEGATIVE
Glucose, UA: NEGATIVE mg/dL
Hgb urine dipstick: NEGATIVE
Ketones, ur: 5 mg/dL — AB
Leukocytes,Ua: NEGATIVE
Nitrite: NEGATIVE
Protein, ur: NEGATIVE mg/dL
Specific Gravity, Urine: >1.046 — ABNORMAL HIGH (ref 1.005–1.030)
pH: 5 (ref 5.0–8.0)

## 2018-12-12 LAB — CREATININE, SERUM
Creatinine, Ser: 1.27 mg/dL — ABNORMAL HIGH (ref 0.61–1.24)
GFR calc Af Amer: >60 mL/min (ref >60)
GFR calc non Af Amer: >60 mL/min (ref >60)

## 2018-12-12 LAB — CBC
HCT: 35.8 % — ABNORMAL LOW (ref 39.0–52.0)
HCT: 34.9 % — ABNORMAL LOW (ref 39.0–52.0)
Hemoglobin: 11.2 g/dL — ABNORMAL LOW (ref 13.0–17.0)
Hemoglobin: 11 g/dL — ABNORMAL LOW (ref 13.0–17.0)
MCH: 28.4 pg (ref 26.0–34.0)
MCH: 28.7 pg (ref 26.0–34.0)
MCHC: 31.3 g/dL (ref 30.0–36.0)
MCHC: 31.5 g/dL (ref 30.0–36.0)
MCV: 90.6 fL (ref 80.0–100.0)
MCV: 91.1 fL (ref 80.0–100.0)
Platelets: 326 10*3/uL (ref 150–400)
Platelets: 304 10*3/uL (ref 150–400)
RBC: 3.95 MIL/uL — ABNORMAL LOW (ref 4.22–5.81)
RBC: 3.83 MIL/uL — ABNORMAL LOW (ref 4.22–5.81)
RDW: 15 % (ref 11.5–15.5)
RDW: 15.1 % (ref 11.5–15.5)
WBC: 7.9 10*3/uL (ref 4.0–10.5)
WBC: 7.5 10*3/uL (ref 4.0–10.5)
nRBC: 0 % (ref 0.0–0.2)
nRBC: 0 % (ref 0.0–0.2)

## 2018-12-12 LAB — SARS CORONAVIRUS 2 BY RT PCR (HOSPITAL ORDER, PERFORMED IN ~~LOC~~ HOSPITAL LAB): SARS Coronavirus 2: NEGATIVE

## 2018-12-12 LAB — LACTIC ACID, PLASMA: Lactic Acid, Venous: 0.8 mmol/L (ref 0.5–1.9)

## 2018-12-12 MED ORDER — ALBUTEROL SULFATE (2.5 MG/3ML) 0.083% IN NEBU: 2.5000 mg | INHALATION_SOLUTION | Freq: Four times a day (QID) | RESPIRATORY_TRACT | Status: DC | PRN

## 2018-12-12 MED ORDER — ALBUTEROL SULFATE (2.5 MG/3ML) 0.083% IN NEBU
2.5000 mg | INHALATION_SOLUTION | Freq: Three times a day (TID) | RESPIRATORY_TRACT | Status: DC
  Administered 2018-12-13: 2.5 mg via RESPIRATORY_TRACT
  Filled 2018-12-12: qty 3

## 2018-12-12 NOTE — ED Notes (Signed)
ED TO INPATIENT HANDOFF REPORT  Name/Age/Gender Nicholas Lowery 58 y.o. male  Code Status    Code Status Orders  (From admission, onward)         Start     Ordered   12/11/18 2304  Do not attempt resuscitation (DNR)  Continuous    Question Answer Comment  In the event of cardiac or respiratory ARREST Do not call a "code blue"   In the event of cardiac or respiratory ARREST Do not perform Intubation, CPR, defibrillation or ACLS   In the event of cardiac or respiratory ARREST Use medication by any route, position, wound care, and other measures to relive pain and suffering. May use oxygen, suction and manual treatment of airway obstruction as needed for comfort.      12/11/18 2304        Code Status History    Date Active Date Inactive Code Status Order ID Comments User Context   11/24/2018 1524 11/27/2018 1958 Full Code [id number]  Alma Friendly, MD ED   09/05/2018 1042 11/24/2018 1054 DNR [id number]  Jonnie Finner, RN Outpatient   08/30/2018 1421 09/03/2018 1705 DNR [id number]  Caren Griffins, MD ED   06/14/2016 2053 06/18/2016 1652 Full Code [id number]  [name], MD Inpatient   Advance Care Planning Activity    Advance Directive Documentation     Most Recent Value  Type of Advance Directive  Healthcare Power of Attorney, Living will  Pre-existing out of facility DNR order (yellow form or pink MOST form)  -  "MOST" Form in Place?  -      Home/SNF/Other Home  Chief Complaint cancer pt; shob; diarrhea; tachycardia  Level of Care/Admitting Diagnosis ED Disposition    ED Disposition Condition [place]: [facility] [100102]  Level of Care: Telemetry [5]  Admit to tele based on following criteria: Complex arrhythmia (Bradycardia/Tachycardia)  Covid Evaluation: Asymptomatic Screening Protocol (No Symptoms)  Diagnosis: Acute colitis [185631]  Admitting Physician: Elwyn Reach [[id number]]  Attending Physician: Elwyn Reach [[id number]]  Estimated length of stay: past midnight tomorrow  Certification:: I certify this patient will need inpatient services for at least 2 midnights  PT Class (Do Not Modify): Inpatient [101]  PT Acc Code (Do Not Modify): Private [1]       Medical History Past Medical History:  Diagnosis Date  . Anxiety   . Cancer of ascending colon ([place])   . Hypertension   . Seasonal allergies     Allergies Allergies  Allergen Reactions  . Penicillins Other (See Comments)    UNSPECIFIED REACTION   Has patient had a PCN reaction causing immediate rash, facial/tongue/throat swelling, SOB or lightheadedness with hypotension: unknown Has patient had a PCN reaction causing severe rash involving mucus membranes or skin necrosis: unknown Has patient had a PCN reaction that required hospitalization unknown Has patient had a PCN reaction occurring within the last 10 years: No If all of the above answers are "NO", then may proceed with Cephalosporin use.   [name] [Capecitabine] Nausea And Vomiting and Other (See Comments)    Foot pain, patient couldn't walk, and bleeding gums  . Nivolumab Nausea And Vomiting, Palpitations and Other (See Comments)    Infusion reaction: Chills, rigors, numbness. Symptoms improved with ondansetron, clonidine, famotidine, meperidine    IV Location/Drains/Wounds Patient Lines/Drains/Airways Status   Active Line/Drains/Airways    Name:   Placement date:   Placement time:   Site:  Days:   Implanted Port 07/06/16 Left Chest   07/06/16    0957    Chest   889   Incision (Closed) 06/14/16 Abdomen Other (Comment)   06/14/16    1556     911   Incision (Closed) 07/06/16 Chest Left   07/06/16    1001     889   Incision - 1 Port Abdomen 1: Superior;Medial   06/14/16    1541     911   Incision - 3 Ports Abdomen 1: Left;Superior;Lateral 2: Left;Mid;Lateral 3: Left;Lower;Lateral   06/14/16    1424     911          Labs/Imaging Results for orders placed or  performed during the hospital encounter of 12/11/18 (from the past 48 hour(s))  Lipase, blood     Status: None   Collection Time: 12/11/18  7:18 PM  Result Value Ref Range   Lipase 23 11 - 51 U/L    Comment: Performed at [facility], [place] [address], [place], [place] [address]  Comprehensive metabolic panel     Status: Abnormal   Collection Time: 12/11/18  7:18 PM  Result Value Ref Range   Sodium 137 135 - 145 mmol/L   Potassium 4.1 3.5 - 5.1 mmol/L   Chloride 102 98 - 111 mmol/L   CO2 21 (L) 22 - 32 mmol/L   Glucose, Bld 141 (H) 70 - 99 mg/dL   BUN 17 6 - 20 mg/dL   Creatinine, Ser 1.32 (H) 0.61 - 1.24 mg/dL   Calcium 9.0 8.9 - 10.3 mg/dL   Total Protein 6.7 6.5 - 8.1 g/dL   Albumin 3.4 (L) 3.5 - 5.0 g/dL   AST 22 15 - 41 U/L   ALT 15 0 - 44 U/L   Alkaline Phosphatase 72 38 - 126 U/L   Total Bilirubin 0.9 0.3 - 1.2 mg/dL   GFR calc non Af Amer >60 >60 mL/min   GFR calc Af Amer >60 >60 mL/min   Anion gap 14 5 - 15    Comment: Performed at [facility], [place] [address] [place], [place] [address]  CBC     Status: Abnormal   Collection Time: 12/11/18  7:18 PM  Result Value Ref Range   WBC 13.2 (H) 4.0 - 10.5 K/uL   RBC 4.69 4.22 - 5.81 MIL/uL   Hemoglobin 12.9 (L) 13.0 - 17.0 g/dL   HCT 41.9 39.0 - 52.0 %   MCV 89.3 80.0 - 100.0 fL   MCH 27.5 26.0 - 34.0 pg   MCHC 30.8 30.0 - 36.0 g/dL   RDW 14.9 11.5 - 15.5 %   Platelets 494 (H) 150 - 400 K/uL   nRBC 0.0 0.0 - 0.2 %    Comment: Performed at [facility], [place] [address], [place], [place] [address]  SARS Coronavirus 2 (CEPHEID - Performed in [place] hospital lab), Hosp Order     Status: None   Collection Time: 12/11/18 11:09 PM   Specimen: Nasopharyngeal Swab  Result Value Ref Range   SARS Coronavirus 2 NEGATIVE NEGATIVE    Comment: (NOTE) If result is NEGATIVE SARS-CoV-2 target nucleic acids are NOT DETECTED. The SARS-CoV-2 RNA is generally detectable in upper  and lower  respiratory specimens during the acute phase of infection. The lowest  concentration of SARS-CoV-2 viral copies this assay can detect is 250  copies / mL. A negative result does not preclude SARS-CoV-2 infection  and should  not be used as the sole basis for treatment or other  patient management decisions.  A negative result may occur with  improper specimen collection / handling, submission of specimen other  than nasopharyngeal swab, presence of viral mutation(s) within the  areas targeted by this assay, and inadequate number of viral copies  (<250 copies / mL). A negative result must be combined with clinical  observations, patient history, and epidemiological information. If result is POSITIVE SARS-CoV-2 target nucleic acids are DETECTED. The SARS-CoV-2 RNA is generally detectable in upper and lower  respiratory specimens dur ing the acute phase of infection.  Positive  results are indicative of active infection with SARS-CoV-2.  Clinical  correlation with patient history and other diagnostic information is  necessary to determine patient infection status.  Positive results do  not rule out bacterial infection or co-infection with other viruses. If result is PRESUMPTIVE POSTIVE SARS-CoV-2 nucleic acids MAY BE PRESENT.   A presumptive positive result was obtained on the submitted specimen  and confirmed on repeat testing.  While 2019 novel coronavirus  (SARS-CoV-2) nucleic acids may be present in the submitted sample  additional confirmatory testing may be necessary for epidemiological  and / or clinical management purposes  to differentiate between  SARS-CoV-2 and other Sarbecovirus currently known to infect humans.  If clinically indicated additional testing with an alternate test  methodology [phone]) is advised. The SARS-CoV-2 RNA is generally  detectable in upper and lower respiratory sp ecimens during the acute  phase of infection. The expected result is  Negative. Fact Sheet for Patients:  StrictlyIdeas.no Fact Sheet for Healthcare Providers: BankingDealers.co.za This test is not yet approved or cleared by the [place] FDA and has been authorized for detection and/or diagnosis of SARS-CoV-2 by FDA under an Emergency Use Authorization (EUA).  This EUA will remain in effect (meaning this test can be used) for the duration of the COVID-19 declaration under Section 564(b)(1) of the Act, 21 U.S.C. section 360bbb-3(b)(1), unless the authorization is terminated or revoked sooner. Performed at [facility], [place] [address], [place], [place] [address]   Lactic acid, plasma     Status: None   Collection Time: 12/11/18 11:09 PM  Result Value Ref Range   Lactic Acid, Venous 0.8 0.5 - 1.9 mmol/L    Comment: Performed at [facility], [place] [address], [place], [place] [address]  Urinalysis, Routine w reflex microscopic     Status: Abnormal   Collection Time: 12/12/18  2:42 AM  Result Value Ref Range   Color, Urine YELLOW YELLOW   APPearance CLEAR CLEAR   Specific Gravity, Urine >1.046 (H) 1.005 - 1.030   pH 5.0 5.0 - 8.0   Glucose, UA NEGATIVE NEGATIVE mg/dL   Hgb urine dipstick NEGATIVE NEGATIVE   Bilirubin Urine NEGATIVE NEGATIVE   Ketones, ur 5 (A) NEGATIVE mg/dL   Protein, ur NEGATIVE NEGATIVE mg/dL   Nitrite NEGATIVE NEGATIVE   Leukocytes,Ua NEGATIVE NEGATIVE    Comment: Performed at [place] [address], [place], [place] [address]  Lactic acid, plasma     Status: None   Collection Time: 12/12/18  2:42 AM  Result Value Ref Range   Lactic Acid, Venous 0.8 0.5 - 1.9 mmol/L    Comment: Performed at [facility], [place] [address], [place], [place] [address]  CBC     Status: Abnormal   Collection Time: 12/12/18  2:42 AM  Result Value Ref Range   WBC 7.9  4.0 - 10.5 K/uL   RBC 3.95 (L) 4.22 - 5.81 MIL/uL    Hemoglobin 11.2 (L) 13.0 - 17.0 g/dL   HCT 35.8 (L) 39.0 - 52.0 %   MCV 90.6 80.0 - 100.0 fL   MCH 28.4 26.0 - 34.0 pg   MCHC 31.3 30.0 - 36.0 g/dL   RDW 15.0 11.5 - 15.5 %   Platelets 326 150 - 400 K/uL   nRBC 0.0 0.0 - 0.2 %    Comment: Performed at [facility], [place] [address], [place], [place] [address]  Creatinine, serum     Status: Abnormal   Collection Time: 12/12/18  2:42 AM  Result Value Ref Range   Creatinine, Ser 1.27 (H) 0.61 - 1.24 mg/dL   GFR calc non Af Amer >60 >60 mL/min   GFR calc Af Amer >60 >60 mL/min    Comment: Performed at [facility], [place] [address] [place], [place] [address]  Comprehensive metabolic panel     Status: Abnormal   Collection Time: 12/12/18  5:35 AM  Result Value Ref Range   Sodium 136 135 - 145 mmol/L   Potassium 4.3 3.5 - 5.1 mmol/L   Chloride 102 98 - 111 mmol/L   CO2 25 22 - 32 mmol/L   Glucose, Bld 108 (H) 70 - 99 mg/dL   BUN 18 6 - 20 mg/dL   Creatinine, Ser 1.33 (H) 0.61 - 1.24 mg/dL   Calcium 8.1 (L) 8.9 - 10.3 mg/dL   Total Protein 5.5 (L) 6.5 - 8.1 g/dL   Albumin 2.7 (L) 3.5 - 5.0 g/dL   AST 17 15 - 41 U/L   ALT 13 0 - 44 U/L   Alkaline Phosphatase 61 38 - 126 U/L   Total Bilirubin 0.6 0.3 - 1.2 mg/dL   GFR calc non Af Amer 60 (L) >60 mL/min   GFR calc Af Amer >60 >60 mL/min   Anion gap 9 5 - 15    Comment: Performed at [facility], [place] [address] [place] [address], [place], [place] [address]  CBC     Status: Abnormal   Collection Time: 12/12/18  5:35 AM  Result Value Ref Range   WBC 7.5 4.0 - 10.5 K/uL   RBC 3.83 (L) 4.22 - 5.81 MIL/uL   Hemoglobin 11.0 (L) 13.0 - 17.0 g/dL   HCT 34.9 (L) 39.0 - 52.0 %   MCV 91.1 80.0 - 100.0 fL   MCH 28.7 26.0 - 34.0 pg   MCHC 31.5 30.0 - 36.0 g/dL   RDW 15.1 11.5 - 15.5 %   Platelets 304 150 - 400 K/uL   nRBC 0.0 0.0 - 0.2 %    Comment: Performed at [facility], [place] [address], [place],  [address]   Ct Abdomen  Pelvis W Contrast  Result Date: 12/11/2018 CLINICAL DATA:  Diarrhea EXAM: CT ABDOMEN AND PELVIS WITH CONTRAST TECHNIQUE: Multidetector CT imaging of the abdomen and pelvis was performed using the standard protocol following bolus administration of intravenous contrast. CONTRAST:  162mL OMNIPAQUE IOHEXOL 300 MG/ML  SOLN COMPARISON:  CT 11/24/2018, 08/29/2018 FINDINGS: Lower chest: Lung bases demonstrate moderate right-sided pleural effusion. Innumerable metastatic pulmonary nodules are again visualized. Peribronchial thickening at the right middle lobe and right lung base. Partial atelectasis or pneumonia at the right base. Hepatobiliary: Multiple hypodense liver nodules consistent with metastatic disease. Index lesion in the inferior right hepatic lobe measures 5.5 cm, compared with 4.3 cm previously. Increased conspicuity of other metastatic  nodules in the liver. No calcified gallstone or biliary dilatation Pancreas: Unremarkable. No pancreatic ductal dilatation or surrounding inflammatory changes. Spleen: Normal in size without focal abnormality. Adrenals/Urinary Tract: Adrenal glands are normal. Kidneys show no hydronephrosis. The bladder is unremarkable Stomach/Bowel: The stomach is nonenlarged. Abnormal segmental small bowel thickening in the right hemiabdomen. No intramural air. Status post partial colectomy Vascular/Lymphatic: Nonaneurysmal aorta. Stable subcentimeter retroperitoneal nodes. Increased intraperitoneal nodules near the umbilical region, measuring up to 14 mm. Reproductive: Slightly enlarged prostate with calcification Other: No free air. Moderate to large volume of ascites in the abdomen and pelvis, increased compared to prior. Ill-defined soft tissue density within the central mesentery adjacent to surgical anastomosis. This appears to encase the distal SMA and SMV. Musculoskeletal: No acute or suspicious osseous abnormality. IMPRESSION: 1. Segmental small bowel wall thickening within the  right hemiabdomen which may be secondary to infection or ischemia, less likely inflammatory bowel disease. Bowel wall thickening appears worse when compared to the most recent CT from November 24, 2018. The segment of thickened small bowel is also fluid filled and there may be a degree of obstruction, possibly related to surgical anastomosis. 2. Moderate right pleural effusion. Numerous metastatic pulmonary nodules as before. 3. Increased size of inferior right hepatic lobe metastatic lesion with increased consult conspicuity of additional hypodense liver lesions, consistent with progression of metastatic disease. 4. Ill-defined mesenteric soft tissue mass near the surgical anastomosis, with encasement of the distal SMA and SMV vessels 5. Moderate to large volume of ascites within the abdomen and pelvis, may be slightly increased. Increased peritoneal nodularity near the umbilicus Electronically Signed   By: Donavan Foil M.D.   On: 12/11/2018 22:25    Pending Labs Unresulted Labs (From admission, onward)    Start     Ordered   12/13/18 4128  Basic metabolic panel  Daily,   R     12/12/18 1030   12/13/18 0500  CBC  Daily,   R     12/12/18 1030   12/12/18 1217  Gastrointestinal Panel by PCR , Stool  (Gastrointestinal Panel by PCR, Stool)  Once,   STAT     12/12/18 1217   12/11/18 2259  C difficile quick scan w PCR reflex  (C Difficile quick screen w PCR reflex panel)  Once, for 24 hours,   STAT     12/11/18 2258          Vitals/Pain Today's Vitals   12/12/18 1200 12/12/18 1300 12/12/18 1400 12/12/18 1500  BP: (!) 113/95 (!) 123/100 (!) 134/105 (!) 120/97  Pulse: (!) 105 100 (!) 107 (!) 101  Resp: 18   18  Temp:      TempSrc:      SpO2: 97% 97% 98% 95%  PainSc:        Isolation Precautions Enteric precautions (UV disinfection)  Medications Medications  multivitamin with minerals tablet 1 tablet (1 tablet Oral Given 12/12/18 1115)  polyethylene glycol (MIRALAX / GLYCOLAX) packet 17 g  (has no administration in time range)  senna-docusate (Senokot-S) tablet 2 tablet (has no administration in time range)  gabapentin (NEURONTIN) capsule 300 mg (has no administration in time range)  fentaNYL (DURAGESIC) 50 MCG/HR 1 patch (has no administration in time range)  oxyCODONE (Oxy IR/ROXICODONE) immediate release tablet 20 mg (20 mg Oral Given 12/12/18 0828)  amLODipine (NORVASC) tablet 10 mg (10 mg Oral Given 12/12/18 1115)  venlafaxine XR (EFFEXOR-XR) 24 hr capsule 150 mg (150 mg Oral Given 12/12/18 0828)  cyclobenzaprine (  FLEXERIL) tablet 5 mg (has no administration in time range)  LORazepam (ATIVAN) tablet 1 mg (1 mg Oral Given 12/12/18 0108)  prochlorperazine (COMPAZINE) tablet 10 mg (has no administration in time range)  naloxone (NARCAN) nasal spray 4 mg/0.1 mL (has no administration in time range)  zolpidem (AMBIEN) tablet 5 mg (5 mg Oral Given 12/12/18 0120)  fentaNYL (DURAGESIC) 25 MCG/HR 1 patch (has no administration in time range)  ondansetron (ZOFRAN) tablet 4 mg (has no administration in time range)  ondansetron (ZOFRAN-ODT) disintegrating tablet 8 mg (has no administration in time range)  heparin injection 5,000 Units (5,000 Units Subcutaneous Given 12/12/18 0537)  dextrose 5 %-0.9 % sodium chloride infusion ( Intravenous New Bag/Given 12/12/18 1508)  ondansetron (ZOFRAN) tablet 4 mg (has no administration in time range)    Or  ondansetron (ZOFRAN) injection 4 mg (has no administration in time range)  levofloxacin (LEVAQUIN) IVPB 750 mg (0 mg Intravenous Stopped 12/12/18 0514)  metroNIDAZOLE (FLAGYL) IVPB 500 mg (500 mg Intravenous New Bag/Given 12/12/18 1512)  budesonide (PULMICORT) nebulizer solution 0.25 mg (0.25 mg Nebulization Given 12/12/18 0828)  albuterol (PROVENTIL) (2.5 MG/3ML) 0.083% nebulizer solution 2.5 mg (has no administration in time range)  sodium chloride 0.9 % bolus 1,000 mL (0 mLs Intravenous Stopped 12/11/18 2307)  morphine 4 MG/ML injection 4 mg (4 mg  Intravenous Given 12/11/18 2045)  ondansetron (ZOFRAN) injection 4 mg (4 mg Intravenous Given 12/11/18 2045)  iohexol (OMNIPAQUE) 300 MG/ML solution 100 mL (100 mLs Intravenous Contrast Given 12/11/18 2142)  sodium chloride 0.9 % bolus 1,000 mL (0 mLs Intravenous Stopped 12/12/18 0223)    Mobility walks with device

## 2018-12-12 NOTE — ED Notes (Signed)
Spoke to pharmacist regarding IV compatibility of flagyl and d5 in .9% NaCl. Per pharmacy, medication should be compatible. Will continue to monitor.

## 2018-12-12 NOTE — Progress Notes (Signed)
Nicholas Lowery   DOB:1961-04-30   CX#:[id number]   SHF#:[id number]  Oncology follow up  Subjective: Patient is well-known to me, under my care for his metastatic colon cancer.  He has had disease progression lately, and has been hospitalized multiple times.  He presented to the emergency room for worsening abdominal pain, nausea and diarrhea.  He was previously constipated, has been taking laxatives.  He was also trying to decrease his narcotics due to constipation. He has low appetite, and has not been slept well, no fever or chills.    Objective:  Vitals:   12/12/18 2009 12/12/18 2041  BP:  111/81  Pulse:  (!) 107  Resp:  15  Temp:  98 F (36.7 C)  SpO2: 94% 95%    Body mass index is 25.97 kg/m.  Intake/Output Summary (Last 24 hours) at 12/12/2018 2253 Last data filed at 12/12/2018 1800 Gross per 24 hour  Intake 1552.6 ml  Output -  Net 1552.6 ml     Sclerae unicteric  Oropharynx clear  No peripheral adenopathy  Lungs clear -- no rales or rhonchi, decreased breath sound on lung bases   Heart regular rate and rhythm  Abdomen soft, (+) diffuse tenderness   MSK no focal spinal tenderness, no peripheral edema  Neuro nonfocal     CBG (last 3)  No results for input(s): GLUCAP in the last 72 hours.   Labs:  Lab Results  Component Value Date   WBC 7.5 12/12/2018   HGB 11.0 (L) 12/12/2018   HCT 34.9 (L) 12/12/2018   MCV 91.1 12/12/2018   PLT 304 12/12/2018   NEUTROABS 3.3 11/24/2018    Urine Studies No results for input(s): UHGB, CRYS in the last 72 hours.  Invalid input(s): UACOL, UAPR, USPG, UPH, UTP, UGL, UKET, UBIL, UNIT, UROB, ULEU, UEPI, UWBC, URBC, UBAC, CAST, [place], [place]  Basic Metabolic Panel: Recent Labs  Lab 12/11/18 1918 12/12/18 0242 12/12/18 0535  NA 137  --  136  K 4.1  --  4.3  CL 102  --  102  CO2 21*  --  25  GLUCOSE 141*  --  108*  BUN 17  --  18  CREATININE 1.32* 1.27* 1.33*  CALCIUM 9.0  --  8.1*   GFR Estimated Creatinine  Clearance: 64.8 mL/min (A) (by C-G formula based on SCr of 1.33 mg/dL (H)). Liver Function Tests: Recent Labs  Lab 12/11/18 1918 12/12/18 0535  AST 22 17  ALT 15 13  ALKPHOS 72 61  BILITOT 0.9 0.6  PROT 6.7 5.5*  ALBUMIN 3.4* 2.7*   Recent Labs  Lab 12/11/18 1918  LIPASE 23   No results for input(s): AMMONIA in the last 168 hours. Coagulation profile No results for input(s): INR, PROTIME in the last 168 hours.  CBC: Recent Labs  Lab 12/11/18 1918 12/12/18 0242 12/12/18 0535  WBC 13.2* 7.9 7.5  HGB 12.9* 11.2* 11.0*  HCT 41.9 35.8* 34.9*  MCV 89.3 90.6 91.1  PLT 494* 326 304   Cardiac Enzymes: No results for input(s): CKTOTAL, CKMB, CKMBINDEX, TROPONINI in the last 168 hours. BNP: Invalid input(s): POCBNP CBG: No results for input(s): GLUCAP in the last 168 hours. D-Dimer No results for input(s): DDIMER in the last 72 hours. Hgb A1c No results for input(s): HGBA1C in the last 72 hours. Lipid Profile No results for input(s): CHOL, HDL, LDLCALC, TRIG, CHOLHDL, LDLDIRECT in the last 72 hours. Thyroid function studies No results for input(s): TSH, T4TOTAL, T3FREE, THYROIDAB in the last 72  hours.  Invalid input(s): FREET3 Anemia work up No results for input(s): VITAMINB12, FOLATE, FERRITIN, TIBC, IRON, RETICCTPCT in the last 72 hours. Microbiology Recent Results (from the past 240 hour(s))  SARS Coronavirus 2 (Performed in [place] hospital lab)     Status: None   Collection Time: 12/07/18  2:33 PM   Specimen: Nasal Swab  Result Value Ref Range Status   SARS Coronavirus 2 NEGATIVE NEGATIVE Final    Comment: (NOTE) SARS-CoV-2 target nucleic acids are NOT DETECTED. The SARS-CoV-2 RNA is generally detectable in upper and lower respiratory specimens during the acute phase of infection. Negative results do not preclude SARS-CoV-2 infection, do not rule out co-infections with other pathogens, and should not be used as the sole basis for treatment or other  patient management decisions. Negative results must be combined with clinical observations, patient history, and epidemiological information. The expected result is Negative. Fact Sheet for Patients: SugarRoll.be Fact Sheet for Healthcare Providers: https://www.woods-mathews.com/ This test is not yet approved or cleared by the [place] FDA and  has been authorized for detection and/or diagnosis of SARS-CoV-2 by FDA under an Emergency Use Authorization (EUA). This EUA will remain  in effect (meaning this test can be used) for the duration of the COVID-19 declaration under Section 56 4(b)(1) of the Act, 21 U.S.C. section 360bbb-3(b)(1), unless the authorization is terminated or revoked sooner. Performed at [facility], [place] [address], [place], [place] [address]   SARS Coronavirus 2 (CEPHEID - Performed in [place] hospital lab), Hosp Order     Status: None   Collection Time: 12/11/18 11:09 PM   Specimen: Nasopharyngeal Swab  Result Value Ref Range Status   SARS Coronavirus 2 NEGATIVE NEGATIVE Final    Comment: (NOTE) If result is NEGATIVE SARS-CoV-2 target nucleic acids are NOT DETECTED. The SARS-CoV-2 RNA is generally detectable in upper and lower  respiratory specimens during the acute phase of infection. The lowest  concentration of SARS-CoV-2 viral copies this assay can detect is 250  copies / mL. A negative result does not preclude SARS-CoV-2 infection  and should not be used as the sole basis for treatment or other  patient management decisions.  A negative result may occur with  improper specimen collection / handling, submission of specimen other  than nasopharyngeal swab, presence of viral mutation(s) within the  areas targeted by this assay, and inadequate number of viral copies  (<250 copies / mL). A negative result must be combined with clinical  observations, patient history, and epidemiological information. If  result is POSITIVE SARS-CoV-2 target nucleic acids are DETECTED. The SARS-CoV-2 RNA is generally detectable in upper and lower  respiratory specimens dur ing the acute phase of infection.  Positive  results are indicative of active infection with SARS-CoV-2.  Clinical  correlation with patient history and other diagnostic information is  necessary to determine patient infection status.  Positive results do  not rule out bacterial infection or co-infection with other viruses. If result is PRESUMPTIVE POSTIVE SARS-CoV-2 nucleic acids MAY BE PRESENT.   A presumptive positive result was obtained on the submitted specimen  and confirmed on repeat testing.  While 2019 novel coronavirus  (SARS-CoV-2) nucleic acids may be present in the submitted sample  additional confirmatory testing may be necessary for epidemiological  and / or clinical management purposes  to differentiate between  SARS-CoV-2 and other Sarbecovirus currently known to infect humans.  If clinically indicated additional testing with an alternate test  methodology [phone]) is advised.  The SARS-CoV-2 RNA is generally  detectable in upper and lower respiratory sp ecimens during the acute  phase of infection. The expected result is Negative. Fact Sheet for Patients:  StrictlyIdeas.no Fact Sheet for Healthcare Providers: BankingDealers.co.za This test is not yet approved or cleared by the [place] FDA and has been authorized for detection and/or diagnosis of SARS-CoV-2 by FDA under an Emergency Use Authorization (EUA).  This EUA will remain in effect (meaning this test can be used) for the duration of the COVID-19 declaration under Section 564(b)(1) of the Act, 21 U.S.C. section 360bbb-3(b)(1), unless the authorization is terminated or revoked sooner. Performed at [facility], [place] [address], [place], [place] [address]       Studies:  Ct Abdomen  Pelvis W Contrast  Result Date: 12/11/2018 CLINICAL DATA:  Diarrhea EXAM: CT ABDOMEN AND PELVIS WITH CONTRAST TECHNIQUE: Multidetector CT imaging of the abdomen and pelvis was performed using the standard protocol following bolus administration of intravenous contrast. CONTRAST:  180mL OMNIPAQUE IOHEXOL 300 MG/ML  SOLN COMPARISON:  CT 11/24/2018, 08/29/2018 FINDINGS: Lower chest: Lung bases demonstrate moderate right-sided pleural effusion. Innumerable metastatic pulmonary nodules are again visualized. Peribronchial thickening at the right middle lobe and right lung base. Partial atelectasis or pneumonia at the right base. Hepatobiliary: Multiple hypodense liver nodules consistent with metastatic disease. Index lesion in the inferior right hepatic lobe measures 5.5 cm, compared with 4.3 cm previously. Increased conspicuity of other metastatic nodules in the liver. No calcified gallstone or biliary dilatation Pancreas: Unremarkable. No pancreatic ductal dilatation or surrounding inflammatory changes. Spleen: Normal in size without focal abnormality. Adrenals/Urinary Tract: Adrenal glands are normal. Kidneys show no hydronephrosis. The bladder is unremarkable Stomach/Bowel: The stomach is nonenlarged. Abnormal segmental small bowel thickening in the right hemiabdomen. No intramural air. Status post partial colectomy Vascular/Lymphatic: Nonaneurysmal aorta. Stable subcentimeter retroperitoneal nodes. Increased intraperitoneal nodules near the umbilical region, measuring up to 14 mm. Reproductive: Slightly enlarged prostate with calcification Other: No free air. Moderate to large volume of ascites in the abdomen and pelvis, increased compared to prior. Ill-defined soft tissue density within the central mesentery adjacent to surgical anastomosis. This appears to encase the distal SMA and SMV. Musculoskeletal: No acute or suspicious osseous abnormality. IMPRESSION: 1. Segmental small bowel wall thickening within the  right hemiabdomen which may be secondary to infection or ischemia, less likely inflammatory bowel disease. Bowel wall thickening appears worse when compared to the most recent CT from November 24, 2018. The segment of thickened small bowel is also fluid filled and there may be a degree of obstruction, possibly related to surgical anastomosis. 2. Moderate right pleural effusion. Numerous metastatic pulmonary nodules as before. 3. Increased size of inferior right hepatic lobe metastatic lesion with increased consult conspicuity of additional hypodense liver lesions, consistent with progression of metastatic disease. 4. Ill-defined mesenteric soft tissue mass near the surgical anastomosis, with encasement of the distal SMA and SMV vessels 5. Moderate to large volume of ascites within the abdomen and pelvis, may be slightly increased. Increased peritoneal nodularity near the umbilicus Electronically Signed   By: Donavan Foil M.D.   On: 12/11/2018 22:25    Assessment: 58 y.o.  Male with stage IV colon cancer, recently progressed, anxiety, depression, HTN, pleural effusion, presented with worsening abdominal pain, nausea, and diarrhea for 1-2 days   1. Acute enteritis  2.  Metastatic colon cancer to liver, peritoneum, pleura, lung, nodes  3.  Abdominal pain secondary to cancer, on chronic narcotics 4.  Hypertension 5. Right pleural effusion and recent pneumonia, ascites  6. Deconditioning    Plan:  -I reviewed his CT scan and discussed with him -will rule out infectious enteritis, agree with antibiotics -continue supportive care  -He has progressed through multiple line chemotherapy, his overall prognosis is guarded, especially since his recently disease progression -he went to [facility] early this week to discuss clinical trial options, he is planning to start in 2 weeks. However I am concerned that his overall condition has deteriorated due to his cancer progression, his performance status may deteriorate  quickly, and he may not be qualify for phase 1 clinical trial -I have prescribed oral chemo drug Lonsurf, he has not started while he is being evaluated for phase 1 trial -he previously agreed with DNR, but reversed on his recent hospitalization.  He has long history of depression and anxiety, although he is more receptive, but overall he has been struggling hard with his terminal cancer, and not ready for hospice or ready to die.  -I will follow up.   Truitt Merle, MD 12/12/2018

## 2018-12-12 NOTE — Telephone Encounter (Signed)
Reviewed documentation to call patient regarding Palliative Care consult and noted patient is in hospital.

## 2018-12-12 NOTE — Progress Notes (Signed)
[facility]                                                                              Patient Demographics  Nicholas Lowery, is a 58 y.o. male, DOB - [birth year], ZOX:[id number]  Admit date - 12/11/2018   Admitting Physician Rometta Emery, MD  Outpatient Primary MD for the patient is Burdine, Ananias Pilgrim, MD  Outpatient specialists:   LOS - 1  days   Medical records reviewed and are as summarized below:    Chief Complaint  Patient presents with   Diarrhea   Tachycardia       Brief summary   Patient is a 59 year old male with colon cancer with metastasis to the liver, hypertension, anxiety disorder, recurrent diarrhea as well as chronic pain from cancer who presented to the ER  with diarrhea and generalized weakness.  Patient has been on high-dose narcotics and apparently been constipated for a while.  He has been taking laxatives lately to relieve his constipation.  He however started having diarrhea a day before the admission which has not stopped.  He has not taken any laxatives.  Patient also has weakness and not even adequately.  He was seen in the ER tachypneic with CT scan showing possible right sided colitis.  He was having 7 out of 10 abdominal pain localized to the area.  Patient is therefore being admitted for colitis questionable infectious versus ischemic.  He is afebrile.  COVID-19 test negative  Assessment & Plan    Principal Problem: Acute enteritis of infectious origin -Possibly infectious.  CT scan showed possible right-sided colitis -Patient also reported being on antibiotics for sepsis and pneumonia when he was admitted 7/12 to 7/15 and was discharged home on levofloxacin -Allergic to penicillins, patient was placed on IV Levaquin and Flagyl, follow C. difficile PCR, GI pathogen panel -COVID-19 test negative -Continue IV fluids, antiemetics  Active Problems:   Metastatic colon cancer to liver (HCC) -Continue outpatient follow-up  with oncology     HTN (hypertension) -BP currently soft, continue amlodipine, hold ACE inhibitor  Right pleural effusion -Chronic, had recent thoracentesis -Currently no shortness of breath, asymptomatic, hold off on thoracentesis    Code Status: DNR DVT Prophylaxis: Heparin Family Communication: Discussed in detail with the patient, all imaging results, lab results explained to the patient    Disposition Plan: Hopefully DC home in a.m. if improving  Time Spent in minutes   35 minutes  Procedures:  None  Consultants:   None  Antimicrobials:   Anti-infectives (From admission, onward)   Start     Dose/Rate Route Frequency Ordered Stop   12/11/18 2330  levofloxacin (LEVAQUIN) IVPB 750 mg     750 mg 100 mL/hr over 90 Minutes Intravenous Daily at bedtime 12/11/18 2322     12/11/18 2330  metroNIDAZOLE (FLAGYL) IVPB 500 mg     500 mg 100 mL/hr over 60 Minutes Intravenous Every 8 hours 12/11/18 2322            Medications  Scheduled Meds:  amLODipine  10 mg Oral Daily   budesonide (PULMICORT) nebulizer solution  0.25 mg Nebulization BID   [  START ON 12/13/2018] fentaNYL  1 patch Transdermal Q48H   [START ON 12/13/2018] fentaNYL  1 patch Transdermal Q48H   heparin  5,000 Units Subcutaneous Q8H   LORazepam  1 mg Oral QHS   multivitamin with minerals  1 tablet Oral Daily   oxyCODONE  20 mg Oral BID   venlafaxine XR  150 mg Oral Q breakfast   Continuous Infusions:  dextrose 5 % and 0.9% NaCl 125 mL/hr at 12/12/18 1508   levofloxacin (LEVAQUIN) IV Stopped (12/12/18 0514)   metronidazole 500 mg (12/12/18 1512)   PRN Meds:.albuterol, cyclobenzaprine, gabapentin, naloxone, ondansetron **OR** ondansetron (ZOFRAN) IV, ondansetron, ondansetron, polyethylene glycol, prochlorperazine, senna-docusate, zolpidem      Subjective:   Nicholas Lowery was seen and examined today.  States had an episode of vomiting earlier this morning at 4:30 AM.  No diarrhea  this morning.  Abdominal pain 6/10, controlled with pain medications.  No fevers. Patient denies dizziness, chest pain, shortness of breath.   Objective:   Vitals:   12/12/18 1400 12/12/18 1500 12/12/18 1619 12/12/18 1634  BP: (!) 134/105 (!) 120/97 (!) 122/93   Pulse: (!) 107 (!) 101 (!) 106   Resp:  18 18   Temp:   98 F (36.7 C)   TempSrc:   Oral   SpO2: 98% 95% 96%   Weight:    82.1 kg  Height:    5\' 10"  (1.778 m)    Intake/Output Summary (Last 24 hours) at 12/12/2018 1651 Last data filed at 12/12/2018 1147 Gross per 24 hour  Intake 758.44 ml  Output --  Net 758.44 ml     Wt Readings from Last 3 Encounters:  12/12/18 82.1 kg  12/05/18 82.1 kg  12/02/18 83.1 kg     Exam  General: Alert and oriented x 3, NAD  Eyes:   HEENT:  Atraumatic, normocephalic, normal oropharynx  Cardiovascular: S1 S2 auscultated,  Regular rate and rhythm.  Respiratory: Clear to auscultation bilaterally, no wheezing, rales or rhonchi  Gastrointestinal: Soft, mild diffuse TTP, nondistended, + bowel sounds  Ext: no pedal edema bilaterally  Neuro: No new deficits  Musculoskeletal: No digital cyanosis, clubbing  Skin: No rashes  Psych: Normal affect and demeanor, alert and oriented x3    Data Reviewed:  I have personally reviewed following labs and imaging studies  Micro Results Recent Results (from the past 240 hour(s))  SARS Coronavirus 2 (Performed in [facility] hospital lab)     Status: None   Collection Time: 12/07/18  2:33 PM   Specimen: Nasal Swab  Result Value Ref Range Status   SARS Coronavirus 2 NEGATIVE NEGATIVE Final    Comment: (NOTE) SARS-CoV-2 target nucleic acids are NOT DETECTED. The SARS-CoV-2 RNA is generally detectable in upper and lower respiratory specimens during the acute phase of infection. Negative results do not preclude SARS-CoV-2 infection, do not rule out co-infections with other pathogens, and should not be used as the sole basis for treatment  or other patient management decisions. Negative results must be combined with clinical observations, patient history, and epidemiological information. The expected result is Negative. Fact Sheet for Patients: HairSlick.no Fact Sheet for Healthcare Providers: quierodirigir.com This test is not yet approved or cleared by the [place] FDA and  has been authorized for detection and/or diagnosis of SARS-CoV-2 by FDA under an Emergency Use Authorization (EUA). This EUA will remain  in effect (meaning this test can be used) for the duration of the COVID-19 declaration under Section 56 4(b)(1) of the  Act, 21 U.S.C. section 360bbb-3(b)(1), unless the authorization is terminated or revoked sooner. Performed at [facility], [address] [place], [place] [address]   SARS Coronavirus 2 (CEPHEID - Performed in [facility] hospital lab), Hosp Order     Status: None   Collection Time: 12/11/18 11:09 PM   Specimen: Nasopharyngeal Swab  Result Value Ref Range Status   SARS Coronavirus 2 NEGATIVE NEGATIVE Final    Comment: (NOTE) If result is NEGATIVE SARS-CoV-2 target nucleic acids are NOT DETECTED. The SARS-CoV-2 RNA is generally detectable in upper and lower  respiratory specimens during the acute phase of infection. The lowest  concentration of SARS-CoV-2 viral copies this assay can detect is 250  copies / mL. A negative result does not preclude SARS-CoV-2 infection  and should not be used as the sole basis for treatment or other  patient management decisions.  A negative result may occur with  improper specimen collection / handling, submission of specimen other  than nasopharyngeal swab, presence of viral mutation(s) within the  areas targeted by this assay, and inadequate number of viral copies  (<250 copies / mL). A negative result must be combined with clinical  observations, patient history, and epidemiological  information. If result is POSITIVE SARS-CoV-2 target nucleic acids are DETECTED. The SARS-CoV-2 RNA is generally detectable in upper and lower  respiratory specimens dur ing the acute phase of infection.  Positive  results are indicative of active infection with SARS-CoV-2.  Clinical  correlation with patient history and other diagnostic information is  necessary to determine patient infection status.  Positive results do  not rule out bacterial infection or co-infection with other viruses. If result is PRESUMPTIVE POSTIVE SARS-CoV-2 nucleic acids MAY BE PRESENT.   A presumptive positive result was obtained on the submitted specimen  and confirmed on repeat testing.  While 2019 novel coronavirus  (SARS-CoV-2) nucleic acids may be present in the submitted sample  additional confirmatory testing may be necessary for epidemiological  and / or clinical management purposes  to differentiate between  SARS-CoV-2 and other Sarbecovirus currently known to infect humans.  If clinically indicated additional testing with an alternate test  methodology [phone]) is advised. The SARS-CoV-2 RNA is generally  detectable in upper and lower respiratory sp ecimens during the acute  phase of infection. The expected result is Negative. Fact Sheet for Patients:  BoilerBrush.com.cy Fact Sheet for Healthcare Providers: https://pope.com/ This test is not yet approved or cleared by the [place] FDA and has been authorized for detection and/or diagnosis of SARS-CoV-2 by FDA under an Emergency Use Authorization (EUA).  This EUA will remain in effect (meaning this test can be used) for the duration of the COVID-19 declaration under Section 564(b)(1) of the Act, 21 U.S.C. section 360bbb-3(b)(1), unless the authorization is terminated or revoked sooner. Performed at [facility], [address], [place], [place] [address]     Radiology  Reports Dg Chest 1 View  Result Date: 12/09/2018 CLINICAL DATA:  Post rt lung thora, in IR, pt waiting, feels better, no quite so sob mk rt-r EXAM: CHEST  1 VIEW COMPARISON:  12/05/2018 FINDINGS: No pneumothorax. Small residual right pleural effusion. Improved aeration at the right lung base with some residual interstitial and airspace opacities medially. Left lung clear. Heart size and mediastinal contours are within normal limits. Left subclavian port catheter to the distal SVC as before. Visualized bones unremarkable. IMPRESSION: No pneumothorax post right thoracentesis. Electronically Signed   By: Algis Downs  Deanne Coffer M.D.   On: 12/09/2018 15:31   Dg Chest 1 View  Result Date: 12/05/2018 CLINICAL DATA:  Evaluate right pleural effusion. Metastatic colon carcinoma. EXAM: CHEST  1 VIEW COMPARISON:  11/25/2018 FINDINGS: By pleural effusion has increased, moderate size, now obscuring the right hemidiaphragm. There is opacity at the right lung base consistent with atelectasis. Remainder of the lungs is clear. No left pleural effusion. Cardiac silhouette is normal in size. No mediastinal or hilar masses. Left anterior chest wall Port-A-Cath is stable. Skeletal structures are grossly intact. IMPRESSION: 1. Increased size of the right pleural effusion, now moderate and obscuring the right hemidiaphragm. There is associated right lung base opacity most likely atelectasis. 2. Remainder of the lungs is clear.  No left pleural effusion. Electronically Signed   By: Amie Portland M.D.   On: 12/05/2018 14:40   Dg Chest 1 View  Result Date: 11/25/2018 CLINICAL DATA:  Status post RIGHT thoracentesis. EXAM: CHEST  1 VIEW COMPARISON:  None. FINDINGS: The RIGHT pleural effusion has decreased in size. Mild RIGHT basilar atelectasis persists. There is no evidence of pneumothorax. A LEFT Port-A-Cath is again noted. The LEFT lung is clear. IMPRESSION: Decreased RIGHT pleural effusion with mild persistent RIGHT basilar atelectasis. No  pneumothorax. Electronically Signed   By: Harmon Pier M.D.   On: 11/25/2018 14:07   Ct Chest W Contrast  Result Date: 11/13/2018 CLINICAL DATA:  Restaging metastatic colon cancer EXAM: CT CHEST, ABDOMEN, AND PELVIS WITH CONTRAST TECHNIQUE: Multidetector CT imaging of the chest, abdomen and pelvis was performed following the standard protocol during bolus administration of intravenous contrast. CONTRAST:  OMNIPAQUE IOHEXOL 300 MG/ML  SOLN COMPARISON:  CT chest 09/02/2018 and CT AP 08/29/2018 FINDINGS: CT CHEST FINDINGS Cardiovascular: The heart size appears normal. Small pericardial effusion, new. Lad coronary artery calcifications. Mediastinum/Nodes: Normal appearance of the thyroid gland. The trachea appears patent and is midline. Normal appearance of the esophagus. -index right paratracheal lymph node measures 1.2 cm, image 14/2. Previously 1.0 cm. Index right paratracheal lymph node measures 1 cm, image 16/2. Previously 0.9 cm. Index right pre-vascular node measures 0.9 cm, image 23/2. Previously 0.7 cm. Right hilar node measures 2 cm, image 33/2.  Previously 1.3 cm. Lungs/Pleura: Similar small to moderate right pleural effusion. Index posteromedial right lower lobe lung nodule measures 1 cm, image 80/6. Previously 0.9 cm. Index superior segment of left lower lobe lung nodule measures 1.9 cm, image 44/6. Unchanged. Index left upper lobe lung nodule measures 0.6 cm, image 69/6. Previously 0.5 cm. Index right upper lobe lung nodule measures 0.6 cm, image 44/6. Previously 0.7 cm. Index right middle lobe perifissural nodule measures 1 cm, image 91/6. Previously 0.8 cm. Index perifissural right lower lobe lung nodule measures 0.8 cm, image 112/6. Previously 1 cm. Musculoskeletal: No chest wall mass or suspicious bone lesions identified. CT ABDOMEN PELVIS FINDINGS Hepatobiliary: Index lesion within segment 6 measures 4.2 cm, image 72/2. Previously 3.2 cm. New, tiny nonspecific hypodensity along the dome  measures 7 mm. Gallbladder normal. No biliary dilatation. Pancreas: Unremarkable. No pancreatic ductal dilatation or surrounding inflammatory changes. Spleen: The spleen is enlarged measuring 15.3 cm in cranial caudal dimension, unchanged. No focal splenic lesion. Adrenals/Urinary Tract: Normal adrenal glands. No kidney mass or hydronephrosis. Urinary bladder normal. Stomach/Bowel: The stomach is nondistended. Status post right hemicolectomy with entero colonic anastomosis. 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 Previously 4.0 x 4.8 cm. No evidence for high-grade bowel obstruction. Vascular/Lymphatic: Normal appearance of  the abdominal aorta. Tumor encasement of the ileocolic branches of the superior mesenteric artery and vein identified. There is venous congestion identified within the associated ileocolic mesentery. Portal vein remains patent. Reproductive: Prostate is unremarkable. Other: New small volume ascites identified. Peritoneal implant at the level of the umbilicus measures 2.7 by 1.4 cm, image 84/2. Previously 2.0 x 1.3 cm. Musculoskeletal: No acute or significant osseous findings. IMPRESSION: 1. There is been interval enlargement of segment 6 liver metastases. Additionally mesenteric tumor at the enterocolonic anastomosis is mildly increased in size from previous exam. Peritoneal deposit along at the umbilicus is mildly increased in size in the interval. New ascites. Findings are concerning for mild progression of disease within the abdomen and pelvis. 2. No significant change in diffuse pulmonary metastases. Stable volume of right pleural effusion. 3. Previous index mesenteric lymph nodes are mildly increased in size in the interval. 4. New small pericardial effusion. Electronically Signed   By: Signa Kell M.D.   On: 11/13/2018 13:27   Ct Angio Chest Pe W Or Wo Contrast  Result Date: 11/24/2018 CLINICAL DATA:  History of metastatic colon carcinoma with  fever and abdominal pain EXAM: CT ANGIOGRAPHY CHEST CT ABDOMEN AND PELVIS WITH CONTRAST TECHNIQUE: Multidetector CT imaging of the chest was performed using the standard protocol during bolus administration of intravenous contrast. Multiplanar CT image reconstructions and MIPs were obtained to evaluate the vascular anatomy. Multidetector CT imaging of the abdomen and pelvis was performed using the standard protocol during bolus administration of intravenous contrast. CONTRAST:  80mL OMNIPAQUE IOHEXOL 350 MG/ML SOLN COMPARISON:  Similar exam dated 11/13/2018 FINDINGS: CTA CHEST FINDINGS Cardiovascular: Thoracic aorta demonstrates a normal branching pattern. No aneurysmal dilatation or dissection is seen. The heart is normal in size and configuration. The pulmonary artery shows a normal branching pattern. No definitive filling defect to suggest pulmonary embolism is noted. Mediastinum/Nodes: Thoracic inlet is within normal limits. The esophagus is within normal limits. Scattered mediastinal lymph nodes are again identified similar to that seen on the recent restaging examination from 11 days previous. Stable right hilar lymph node is noted as well. Lungs/Pleura: The lungs are well aerated bilaterally. Nodules are again identified bilaterally. The overall appearance is stable from the prior exam from 11 days previous. An enlarging right-sided pleural effusion is seen. New superimposed right lower lobe and right middle lobe infiltrate is seen. Musculoskeletal: Degenerative changes of the thoracic spine are noted. No metastatic lesions are seen. Left-sided chest wall port is noted Review of the MIP images confirms the above findings. CT ABDOMEN and PELVIS FINDINGS Hepatobiliary: Liver again demonstrates a hypodense lesion within the right lobe consistent with metastatic disease measuring 4.2 cm in greatest dimension. Small hypodensity in the dome of the liver is noted also stable from the prior study. Gallbladder  demonstrates vicarious excretion of contrast. No ductal dilatation is seen. Pancreas: Unremarkable. No pancreatic ductal dilatation or surrounding inflammatory changes. Spleen: Normal in size without focal abnormality. Adrenals/Urinary Tract: Adrenal glands are within normal limits. Kidneys demonstrate no renal calculi or obstructive changes bilaterally. Normal excretion of contrast is seen. The bladder is decompressed. Stomach/Bowel: Colon is well visualized. Postsurgical changes are noted in the mid transverse colon consistent with the prior surgical history. There is again noted soft tissue mass in the mesentery adjacent to the anastomotic site best seen in the coronal plane on image number 40 of series 4. Just proximal to the small bowel colon anastomotic site there is some mild dilatation of the distal small bowel  identified. Multiple edematous loops of small bowel are noted proximal to the anastomotic site. A mild partial small bowel obstruction at the anastomotic site cannot be totally excluded. Vascular/Lymphatic: No aneurysmal dilatation is seen. The encasement of the superior mesenteric artery and vein by the adjacent mesenteric mass is less well visualized on today's exam due to the timing of the contrast bolus. There does remain however venous engorgement of the small bowel contributing to the degree of small bowel wall edema and likely ascites. Reproductive: Prostate is unremarkable. Other: Increase in ascites is noted both in the pelvis as well as surrounding the bowel loops and liver. Peritoneal implant is again noted near the umbilicus. Musculoskeletal: Degenerative changes of lumbar spine are seen. Review of the MIP images confirms the above findings. IMPRESSION: CTA of the chest: No evidence of pulmonary emboli are identified. Stable right hilar and mediastinal adenopathy is seen. Stable metastatic lesions within both lungs. Increasing right-sided pleural effusion with associated right lower and  middle lobe infiltrates CT of the abdomen and pelvis: Postsurgical changes consistent with the given clinical history. There remains a mesenteric mass near the anastomotic site similar to that seen on the recent exam. Stable appearing hepatic metastatic disease is noted. Persistent venous engorgement is noted related to vascular encasement with new small bowel edematous changes identified. Some mild dilatation of the distal small bowel proximal to the anastomosis is seen. The possibility of mild narrowing at the anastomosis deserves consideration. This was not present on the most recent exam. This may be accentuated by the venous engorgement and small-bowel wall edema. Increasing ascites. There are changes consistent with peritoneal implantation stable from the prior exam. Electronically Signed   By: Alcide Clever M.D.   On: 11/24/2018 18:13   Ct Abdomen Pelvis W Contrast  Result Date: 12/11/2018 CLINICAL DATA:  Diarrhea EXAM: CT ABDOMEN AND PELVIS WITH CONTRAST TECHNIQUE: Multidetector CT imaging of the abdomen and pelvis was performed using the standard protocol following bolus administration of intravenous contrast. CONTRAST:  OMNIPAQUE IOHEXOL 300 MG/ML  SOLN COMPARISON:  CT 11/24/2018, 08/29/2018 FINDINGS: Lower chest: Lung bases demonstrate moderate right-sided pleural effusion. Innumerable metastatic pulmonary nodules are again visualized. Peribronchial thickening at the right middle lobe and right lung base. Partial atelectasis or pneumonia at the right base. Hepatobiliary: Multiple hypodense liver nodules consistent with metastatic disease. Index lesion in the inferior right hepatic lobe measures 5.5 cm, compared with 4.3 cm previously. Increased conspicuity of other metastatic nodules in the liver. No calcified gallstone or biliary dilatation Pancreas: Unremarkable. No pancreatic ductal dilatation or surrounding inflammatory changes. Spleen: Normal in size without focal abnormality.  Adrenals/Urinary Tract: Adrenal glands are normal. Kidneys show no hydronephrosis. The bladder is unremarkable Stomach/Bowel: The stomach is nonenlarged. Abnormal segmental small bowel thickening in the right hemiabdomen. No intramural air. Status post partial colectomy Vascular/Lymphatic: Nonaneurysmal aorta. Stable subcentimeter retroperitoneal nodes. Increased intraperitoneal nodules near the umbilical region, measuring up to 14 mm. Reproductive: Slightly enlarged prostate with calcification Other: No free air. Moderate to large volume of ascites in the abdomen and pelvis, increased compared to prior. Ill-defined soft tissue density within the central mesentery adjacent to surgical anastomosis. This appears to encase the distal SMA and SMV. Musculoskeletal: No acute or suspicious osseous abnormality. IMPRESSION: 1. Segmental small bowel wall thickening within the right hemiabdomen which may be secondary to infection or ischemia, less likely inflammatory bowel disease. Bowel wall thickening appears worse when compared to the most recent CT from November 24, 2018. The segment of  thickened small bowel is also fluid filled and there may be a degree of obstruction, possibly related to surgical anastomosis. 2. Moderate right pleural effusion. Numerous metastatic pulmonary nodules as before. 3. Increased size of inferior right hepatic lobe metastatic lesion with increased consult conspicuity of additional hypodense liver lesions, consistent with progression of metastatic disease. 4. Ill-defined mesenteric soft tissue mass near the surgical anastomosis, with encasement of the distal SMA and SMV vessels 5. Moderate to large volume of ascites within the abdomen and pelvis, may be slightly increased. Increased peritoneal nodularity near the umbilicus Electronically Signed   By: Jasmine Pang M.D.   On: 12/11/2018 22:25   Ct Abdomen Pelvis W Contrast  Result Date: 11/24/2018 CLINICAL DATA:  History of metastatic colon  carcinoma with fever and abdominal pain EXAM: CT ANGIOGRAPHY CHEST CT ABDOMEN AND PELVIS WITH CONTRAST TECHNIQUE: Multidetector CT imaging of the chest was performed using the standard protocol during bolus administration of intravenous contrast. Multiplanar CT image reconstructions and MIPs were obtained to evaluate the vascular anatomy. Multidetector CT imaging of the abdomen and pelvis was performed using the standard protocol during bolus administration of intravenous contrast. CONTRAST:  80mL OMNIPAQUE IOHEXOL 350 MG/ML SOLN COMPARISON:  Similar exam dated 11/13/2018 FINDINGS: CTA CHEST FINDINGS Cardiovascular: Thoracic aorta demonstrates a normal branching pattern. No aneurysmal dilatation or dissection is seen. The heart is normal in size and configuration. The pulmonary artery shows a normal branching pattern. No definitive filling defect to suggest pulmonary embolism is noted. Mediastinum/Nodes: Thoracic inlet is within normal limits. The esophagus is within normal limits. Scattered mediastinal lymph nodes are again identified similar to that seen on the recent restaging examination from 11 days previous. Stable right hilar lymph node is noted as well. Lungs/Pleura: The lungs are well aerated bilaterally. Nodules are again identified bilaterally. The overall appearance is stable from the prior exam from 11 days previous. An enlarging right-sided pleural effusion is seen. New superimposed right lower lobe and right middle lobe infiltrate is seen. Musculoskeletal: Degenerative changes of the thoracic spine are noted. No metastatic lesions are seen. Left-sided chest wall port is noted Review of the MIP images confirms the above findings. CT ABDOMEN and PELVIS FINDINGS Hepatobiliary: Liver again demonstrates a hypodense lesion within the right lobe consistent with metastatic disease measuring 4.2 cm in greatest dimension. Small hypodensity in the dome of the liver is noted also stable from the prior study.  Gallbladder demonstrates vicarious excretion of contrast. No ductal dilatation is seen. Pancreas: Unremarkable. No pancreatic ductal dilatation or surrounding inflammatory changes. Spleen: Normal in size without focal abnormality. Adrenals/Urinary Tract: Adrenal glands are within normal limits. Kidneys demonstrate no renal calculi or obstructive changes bilaterally. Normal excretion of contrast is seen. The bladder is decompressed. Stomach/Bowel: Colon is well visualized. Postsurgical changes are noted in the mid transverse colon consistent with the prior surgical history. There is again noted soft tissue mass in the mesentery adjacent to the anastomotic site best seen in the coronal plane on image number 40 of series 4. Just proximal to the small bowel colon anastomotic site there is some mild dilatation of the distal small bowel identified. Multiple edematous loops of small bowel are noted proximal to the anastomotic site. A mild partial small bowel obstruction at the anastomotic site cannot be totally excluded. Vascular/Lymphatic: No aneurysmal dilatation is seen. The encasement of the superior mesenteric artery and vein by the adjacent mesenteric mass is less well visualized on today's exam due to the timing of the contrast bolus.  There does remain however venous engorgement of the small bowel contributing to the degree of small bowel wall edema and likely ascites. Reproductive: Prostate is unremarkable. Other: Increase in ascites is noted both in the pelvis as well as surrounding the bowel loops and liver. Peritoneal implant is again noted near the umbilicus. Musculoskeletal: Degenerative changes of lumbar spine are seen. Review of the MIP images confirms the above findings. IMPRESSION: CTA of the chest: No evidence of pulmonary emboli are identified. Stable right hilar and mediastinal adenopathy is seen. Stable metastatic lesions within both lungs. Increasing right-sided pleural effusion with associated right  lower and middle lobe infiltrates CT of the abdomen and pelvis: Postsurgical changes consistent with the given clinical history. There remains a mesenteric mass near the anastomotic site similar to that seen on the recent exam. Stable appearing hepatic metastatic disease is noted. Persistent venous engorgement is noted related to vascular encasement with new small bowel edematous changes identified. Some mild dilatation of the distal small bowel proximal to the anastomosis is seen. The possibility of mild narrowing at the anastomosis deserves consideration. This was not present on the most recent exam. This may be accentuated by the venous engorgement and small-bowel wall edema. Increasing ascites. There are changes consistent with peritoneal implantation stable from the prior exam. Electronically Signed   By: Alcide Clever M.D.   On: 11/24/2018 18:13   Ct Abdomen Pelvis W Contrast  Result Date: 11/13/2018 CLINICAL DATA:  Restaging metastatic colon cancer EXAM: CT CHEST, ABDOMEN, AND PELVIS WITH CONTRAST TECHNIQUE: Multidetector CT imaging of the chest, abdomen and pelvis was performed following the standard protocol during bolus administration of intravenous contrast. CONTRAST:  OMNIPAQUE IOHEXOL 300 MG/ML  SOLN COMPARISON:  CT chest 09/02/2018 and CT AP 08/29/2018 FINDINGS: CT CHEST FINDINGS Cardiovascular: The heart size appears normal. Small pericardial effusion, new. Lad coronary artery calcifications. Mediastinum/Nodes: Normal appearance of the thyroid gland. The trachea appears patent and is midline. Normal appearance of the esophagus. -index right paratracheal lymph node measures 1.2 cm, image 14/2. Previously 1.0 cm. Index right paratracheal lymph node measures 1 cm, image 16/2. Previously 0.9 cm. Index right pre-vascular node measures 0.9 cm, image 23/2. Previously 0.7 cm. Right hilar node measures 2 cm, image 33/2.  Previously 1.3 cm. Lungs/Pleura: Similar small to moderate right pleural effusion.  Index posteromedial right lower lobe lung nodule measures 1 cm, image 80/6. Previously 0.9 cm. Index superior segment of left lower lobe lung nodule measures 1.9 cm, image 44/6. Unchanged. Index left upper lobe lung nodule measures 0.6 cm, image 69/6. Previously 0.5 cm. Index right upper lobe lung nodule measures 0.6 cm, image 44/6. Previously 0.7 cm. Index right middle lobe perifissural nodule measures 1 cm, image 91/6. Previously 0.8 cm. Index perifissural right lower lobe lung nodule measures 0.8 cm, image 112/6. Previously 1 cm. Musculoskeletal: No chest wall mass or suspicious bone lesions identified. CT ABDOMEN PELVIS FINDINGS Hepatobiliary: Index lesion within segment 6 measures 4.2 cm, image 72/2. Previously 3.2 cm. New, tiny nonspecific hypodensity along the dome measures 7 mm. Gallbladder normal. No biliary dilatation. Pancreas: Unremarkable. No pancreatic ductal dilatation or surrounding inflammatory changes. Spleen: The spleen is enlarged measuring 15.3 cm in cranial caudal dimension, unchanged. No focal splenic lesion. Adrenals/Urinary Tract: Normal adrenal glands. No kidney mass or hydronephrosis. Urinary bladder normal. Stomach/Bowel: The stomach is nondistended. Status post right hemicolectomy with entero colonic anastomosis. 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 Previously 4.0 x 4.8 cm. No  evidence for high-grade bowel obstruction. Vascular/Lymphatic: Normal appearance of the abdominal aorta. Tumor encasement of the ileocolic branches of the superior mesenteric artery and vein identified. There is venous congestion identified within the associated ileocolic mesentery. Portal vein remains patent. Reproductive: Prostate is unremarkable. Other: New small volume ascites identified. Peritoneal implant at the level of the umbilicus measures 2.7 by 1.4 cm, image 84/2. Previously 2.0 x 1.3 cm. Musculoskeletal: No acute or significant osseous findings. IMPRESSION: 1.  There is been interval enlargement of segment 6 liver metastases. Additionally mesenteric tumor at the enterocolonic anastomosis is mildly increased in size from previous exam. Peritoneal deposit along at the umbilicus is mildly increased in size in the interval. New ascites. Findings are concerning for mild progression of disease within the abdomen and pelvis. 2. No significant change in diffuse pulmonary metastases. Stable volume of right pleural effusion. 3. Previous index mesenteric lymph nodes are mildly increased in size in the interval. 4. New small pericardial effusion. Electronically Signed   By: Signa Kell M.D.   On: 11/13/2018 13:27   US Abdomen Limited  Result Date: 12/02/2018 CLINICAL DATA:  Metastatic colon cancer to the liver. Follow-up ascites. EXAM: LIMITED ABDOMEN ULTRASOUND FOR ASCITES TECHNIQUE: Limited ultrasound survey for ascites was performed in all four abdominal quadrants. COMPARISON:  Abdomen and pelvis CT dated 11/24/2018. FINDINGS: Moderate amount of free peritoneal fluid, increased since 11/24/2018. IMPRESSION: Moderate amount of ascites, increased. Electronically Signed   By: Beckie Salts M.D.   On: 12/02/2018 09:18   Dg Chest Port 1 View  Result Date: 11/24/2018 CLINICAL DATA:  Fever, cough, shortness of breath, nausea. EXAM: PORTABLE CHEST 1 VIEW COMPARISON:  November 05, 2018 FINDINGS: Left subclavian approach injectable port in stable position. Cardiomediastinal silhouette is mildly enlarged. Mediastinal contours appear intact. The left lung is clear. Right cardiophrenic airspace opacity and probable right pleural effusion. Previously noted by CT small pulmonary nodules are not seen radiographically. Osseous structures are without acute abnormality. Soft tissues are grossly normal. IMPRESSION: Right cardiophrenic airspace opacity and probable right pleural effusion. Electronically Signed   By: Ted Mcalpine M.D.   On: 11/24/2018 12:19   [place] Ascites (abdomen  Limited)  Result Date: 12/02/2018 CLINICAL DATA:  58 year old male with colon cancer metastatic to the liver. EXAM: LIMITED ABDOMEN ULTRASOUND FOR ASCITES TECHNIQUE: Limited ultrasound survey for ascites was performed in all four abdominal quadrants. COMPARISON:  CT scan of the abdomen and pelvis 11/24/2018 FINDINGS: Sonographic interrogation of the 4 quadrants of the abdomen demonstrates small volume intraperitoneal ascites. There is a moderately large right-sided pleural effusion. Fluid was deemed too scant for paracentesis. IMPRESSION: 1. Small volume ascites.  Paracentesis was deferred. 2. Moderate right pleural effusion. Electronically Signed   By: Malachy Moan M.D.   On: 12/02/2018 16:20   Ir Thoracentesis Asp Pleural Space W/img Guide  Result Date: 12/09/2018 INDICATION: Patient with history of metastatic colon cancer, recurrent right-sided pleural effusion. Request to IR therapeutic right-sided thoracentesis. EXAM: ULTRASOUND GUIDED right THORACENTESIS MEDICATIONS: 10 mL 1% lidocaine COMPLICATIONS: None immediate. PROCEDURE: An ultrasound guided thoracentesis was thoroughly discussed with the patient and questions answered. The benefits, risks, alternatives and complications were also discussed. The patient understands and wishes to proceed with the procedure. Written consent was obtained. Ultrasound was performed to localize and mark an adequate pocket of fluid in the right chest. The area was then prepped and draped in the normal sterile fashion. 1% Lidocaine was used for local anesthesia. Under ultrasound guidance a 6 Fr Safe-T-Centesis catheter  was introduced. Thoracentesis was performed. The catheter was removed and a dressing applied. FINDINGS: A total of approximately 2.1 L of clear yellow fluid was removed. IMPRESSION: Successful ultrasound guided right thoracentesis yielding 2.1 L of pleural fluid. Read by Lynnette Caffey, PA-C Electronically Signed   By: Richarda Overlie M.D.   On:  12/09/2018 15:01   US Thoracentesis Asp Pleural Space W/img Guide  Result Date: 11/25/2018 INDICATION: Patient with history of shortness of breath, pneumonia, right pleural effusion. Request made for diagnostic and therapeutic thoracentesis. EXAM: ULTRASOUND GUIDED RIGHT DIAGNOSTIC AND THERAPEUTIC THORACENTESIS MEDICATIONS: 10 mL 1% lidocaine COMPLICATIONS: None immediate. PROCEDURE: An ultrasound guided thoracentesis was thoroughly discussed with the patient and questions answered. The benefits, risks, alternatives and complications were also discussed. The patient understands and wishes to proceed with the procedure. Written consent was obtained. Ultrasound was performed to localize and mark an adequate pocket of fluid in the right chest. The area was then prepped and draped in the normal sterile fashion. 1% Lidocaine was used for local anesthesia. Under ultrasound guidance a 6 Fr Safe-T-Centesis catheter was introduced. Thoracentesis was performed. The catheter was removed and a dressing applied. FINDINGS: A total of approximately 1.3 liters of clear, yellow fluid was removed. Samples were sent to the laboratory as requested by the clinical team. IMPRESSION: Successful ultrasound guided diagnostic and therapeutic right thoracentesis yielding 1.3 liters of pleural fluid. Read by: Loyce Dys PA-C Electronically Signed   By: Richarda Overlie M.D.   On: 11/25/2018 14:33    Lab Data:  CBC: Recent Labs  Lab 12/11/18 1918 12/12/18 0242 12/12/18 0535  WBC 13.2* 7.9 7.5  HGB 12.9* 11.2* 11.0*  HCT 41.9 35.8* 34.9*  MCV 89.3 90.6 91.1  PLT 494* 326 304   Basic Metabolic Panel: Recent Labs  Lab 12/11/18 1918 12/12/18 0242 12/12/18 0535  NA 137  --  136  K 4.1  --  4.3  CL 102  --  102  CO2 21*  --  25  GLUCOSE 141*  --  108*  BUN 17  --  18  CREATININE 1.32* 1.27* 1.33*  CALCIUM 9.0  --  8.1*   GFR: Estimated Creatinine Clearance: 64.8 mL/min (A) (by C-G formula based on SCr of 1.33 mg/dL  (H)). Liver Function Tests: Recent Labs  Lab 12/11/18 1918 12/12/18 0535  AST 22 17  ALT 15 13  ALKPHOS 72 61  BILITOT 0.9 0.6  PROT 6.7 5.5*  ALBUMIN 3.4* 2.7*   Recent Labs  Lab 12/11/18 1918  LIPASE 23   No results for input(s): AMMONIA in the last 168 hours. Coagulation Profile: No results for input(s): INR, PROTIME in the last 168 hours. Cardiac Enzymes: No results for input(s): CKTOTAL, CKMB, CKMBINDEX, TROPONINI in the last 168 hours. BNP (last 3 results) No results for input(s): PROBNP in the last 8760 hours. HbA1C: No results for input(s): HGBA1C in the last 72 hours. CBG: No results for input(s): GLUCAP in the last 168 hours. Lipid Profile: No results for input(s): CHOL, HDL, LDLCALC, TRIG, CHOLHDL, LDLDIRECT in the last 72 hours. Thyroid Function Tests: No results for input(s): TSH, T4TOTAL, FREET4, T3FREE, THYROIDAB in the last 72 hours. Anemia Panel: No results for input(s): VITAMINB12, FOLATE, FERRITIN, TIBC, IRON, RETICCTPCT in the last 72 hours. Urine analysis:    Component Value Date/Time   COLORURINE YELLOW 12/12/2018 0242   APPEARANCEUR CLEAR 12/12/2018 0242   LABSPEC >1.046 (H) 12/12/2018 0242   PHURINE 5.0 12/12/2018 0242   GLUCOSEU NEGATIVE 12/12/2018  0242   HGBUR NEGATIVE 12/12/2018 0242   BILIRUBINUR NEGATIVE 12/12/2018 0242   KETONESUR 5 (A) 12/12/2018 0242   PROTEINUR NEGATIVE 12/12/2018 0242   NITRITE NEGATIVE 12/12/2018 0242   LEUKOCYTESUR NEGATIVE 12/12/2018 0242     Daelin Haste M.D. [facility] 12/12/2018, 4:51 PM  Pager: [phone] Between 7am to 7pm - call Pager - [phone]  After 7pm go to [email or web address] - password [email or web address]  Call night coverage person covering after 7pm

## 2018-12-12 NOTE — Progress Notes (Signed)
MEDICATION RELATED CONSULT NOTE - INITIAL   Pharmacy Consult for Trifluridine-Tipiracil Frankey Poot) Indication: Oral chemo  Allergies  Allergen Reactions  . Penicillins Other (See Comments)    UNSPECIFIED REACTION   Has patient had a PCN reaction causing immediate rash, facial/tongue/throat swelling, SOB or lightheadedness with hypotension: unknown Has patient had a PCN reaction causing severe rash involving mucus membranes or skin necrosis: unknown Has patient had a PCN reaction that required hospitalization unknown Has patient had a PCN reaction occurring within the last 10 years: No If all of the above answers are "NO", then may proceed with Cephalosporin use.   [name] [Capecitabine] Nausea And Vomiting and Other (See Comments)    Foot pain, patient couldn't walk, and bleeding gums  . Nivolumab Nausea And Vomiting, Palpitations and Other (See Comments)    Infusion reaction: Chills, rigors, numbness. Symptoms improved with ondansetron, clonidine, famotidine, meperidine    Patient Measurements:   Adjusted Body Weight:   Vital Signs: Temp: 97.6 F (36.4 C) (07/29 1859) Temp Source: Oral (07/29 1859) BP: 127/113 (07/29 2130) Pulse Rate: 139 (07/29 2130) Intake/Output from previous day: No intake/output data recorded. Intake/Output from this shift: No intake/output data recorded.  Labs: Recent Labs    12/11/18 1918  WBC 13.2*  HGB 12.9*  HCT 41.9  PLT 494*  CREATININE 1.32*  ALBUMIN 3.4*  PROT 6.7  AST 22  ALT 15  ALKPHOS 72  BILITOT 0.9   Estimated Creatinine Clearance: 65.3 mL/min (A) (by C-G formula based on SCr of 1.32 mg/dL (H)).   Microbiology: Recent Results (from the past 720 hour(s))  Novel Coronavirus, NAA ([facility])     Status: None   Collection Time: 11/19/18  8:24 AM  Result Value Ref Range Status   SARS-CoV-2, NAA Not Detected Not Detected Final    Comment: Testing was performed using the cobas(R) SARS-CoV-2 test. This test was developed and  its performance characteristics determined by [employer]. This test has not been FDA cleared or approved. This test has been authorized by FDA under an Emergency Use Authorization (EUA). This test is only authorized for the duration of time the declaration that circumstances exist justifying the authorization of the emergency use of in vitro diagnostic tests for detection of SARS-CoV-2 virus and/or diagnosis of COVID-19 infection under section 564(b)(1) of the Act, 21 U.S.C. 867EHM-0(N)(4), unless the authorization is terminated or revoked sooner. When diagnostic testing is negative, the possibility of a false negative result should be considered in the context of a patient's recent exposures and the presence of clinical signs and symptoms consistent with COVID-19. An individual without symptoms of COVID-19 and who is not shedding SARS-CoV-2 virus would expect to have a negati ve (not detected) result in this assay.   Blood Culture (routine x 2)     Status: None   Collection Time: 11/24/18 11:33 AM   Specimen: BLOOD  Result Value Ref Range Status   Specimen Description   Final    BLOOD SITE NOT SPECIFIED Performed at [facility], [address], [place], [place] [address]    Special Requests   Final    BOTTLES DRAWN AEROBIC AND ANAEROBIC Blood Culture adequate volume Performed at [facility] [address], [place], [place] [address]    Culture   Final    NO GROWTH 5 DAYS Performed at [facility], [place] [address], [place], [place] [address]    Report Status 11/29/2018 FINAL  Final  Blood Culture (routine x  2)     Status: None   Collection Time: 11/24/18 12:25 PM   Specimen: BLOOD  Result Value Ref Range Status   Specimen Description   Final    BLOOD PORT Performed at  [facility] [address] [place], [place] [address]    Special Requests   Final    BOTTLES DRAWN AEROBIC ONLY Blood Culture results may not be  optimal due to an excessive volume of blood received in culture bottles Performed at [place] [address] [place], [place] [address]    Culture   Final    NO GROWTH 5 DAYS Performed at [facility], [place] [address] [place], [place] [address]    Report Status 11/29/2018 FINAL  Final  SARS Coronavirus 2 (CEPHEID- Performed in [place] hospital lab), Hosp Order     Status: None   Collection Time: 11/24/18 12:53 PM   Specimen: Nasopharyngeal Swab  Result Value Ref Range Status   SARS Coronavirus 2 NEGATIVE NEGATIVE Final    Comment: (NOTE) If result is NEGATIVE SARS-CoV-2 target nucleic acids are NOT DETECTED. The SARS-CoV-2 RNA is generally detectable in upper and lower  respiratory specimens during the acute phase of infection. The lowest  concentration of SARS-CoV-2 viral copies this assay can detect is 250  copies / mL. A negative result does not preclude SARS-CoV-2 infection  and should not be used as the sole basis for treatment or other  patient management decisions.  A negative result may occur with  improper specimen collection / handling, submission of specimen other  than nasopharyngeal swab, presence of viral mutation(s) within the  areas targeted by this assay, and inadequate number of viral copies  (<250 copies / mL). A negative result must be combined with clinical  observations, patient history, and epidemiological information. If result is POSITIVE SARS-CoV-2 target nucleic acids are DETECTED. The SARS-CoV-2 RNA is generally detectable in upper and lower  respiratory specimens dur ing the acute phase of infection.  Positive  results are indicative of active infection with SARS-CoV-2.  Clinical  correlation with patient history and other diagnostic information is  necessary to determine patient infection status.  Positive results do  not rule out bacterial infection or co-infection with other viruses. If result is PRESUMPTIVE  POSTIVE SARS-CoV-2 nucleic acids MAY BE PRESENT.   A presumptive positive result was obtained on the submitted specimen  and confirmed on repeat testing.  While 2019 novel coronavirus  (SARS-CoV-2) nucleic acids may be present in the submitted sample  additional confirmatory testing may be necessary for epidemiological  and / or clinical management purposes  to differentiate between  SARS-CoV-2 and other Sarbecovirus currently known to infect humans.  If clinically indicated additional testing with an alternate test  methodology [phone]) is advised. The SARS-CoV-2 RNA is generally  detectable in upper and lower respiratory sp ecimens during the acute  phase of infection. The expected result is Negative. Fact Sheet for Patients:  StrictlyIdeas.no Fact Sheet for Healthcare Providers: BankingDealers.co.za This test is not yet approved or cleared by the [place] FDA and has been authorized for detection and/or diagnosis of SARS-CoV-2 by FDA under an Emergency Use Authorization (EUA).  This EUA will remain in effect (meaning this test can be used) for the duration of the COVID-19 declaration under Section 564(b)(1) of the Act, 21 U.S.C. section 360bbb-3(b)(1), unless the authorization is terminated or revoked sooner. Performed at [facility], [place] [address] [place], [place]  [address]   Urine culture     Status: None   Collection Time: 11/24/18  5:00 PM   Specimen: In/Out Cath Urine  Result Value Ref Range Status   Specimen Description   Final    IN/OUT CATH URINE Performed at [facility] [address] [place], [place] [address]    Special Requests   Final    NONE Performed at [facility], [place] [address], [place], [place] [address]    Culture   Final    NO GROWTH Performed at [facility], [place] [address], [place], [place] [address]    Report Status 11/25/2018  FINAL  Final  MRSA PCR Screening     Status: None   Collection Time: 11/24/18  6:53 PM   Specimen: Nasopharyngeal  Result Value Ref Range Status   MRSA by PCR NEGATIVE NEGATIVE Final    Comment:        The GeneXpert MRSA Assay (FDA approved for NASAL specimens only), is one component of a comprehensive MRSA colonization surveillance program. It is not intended to diagnose MRSA infection nor to guide or monitor treatment for MRSA infections. Performed at [facility], [place] [address], [place], [place] [address]   Culture, body fluid-bottle     Status: None   Collection Time: 11/25/18  2:07 PM   Specimen: Pleura  Result Value Ref Range Status   Specimen Description PLEURAL FLUID  Final   Special Requests   Final    BOTTLES DRAWN AEROBIC AND ANAEROBIC Blood Culture adequate volume   Culture   Final    NO GROWTH 5 DAYS Performed at [facility], [address] [place], [place] [address]    Report Status 11/30/2018 FINAL  Final  Gram stain     Status: None   Collection Time: 11/25/18  2:07 PM   Specimen: Pleura  Result Value Ref Range Status   Specimen Description PLEURAL FLUID  Final   Special Requests NONE  Final   Gram Stain   Final    WBC PRESENT,BOTH PMN AND MONONUCLEAR NO ORGANISMS SEEN CYTOSPIN SMEAR Performed at [facility], [address] [place], [place] [address]    Report Status 11/25/2018 FINAL  Final  SARS Coronavirus 2 (Performed in [facility] hospital lab)     Status: None   Collection Time: 12/07/18  2:33 PM   Specimen: Nasal Swab  Result Value Ref Range Status   SARS Coronavirus 2 NEGATIVE NEGATIVE Final    Comment: (NOTE) SARS-CoV-2 target nucleic acids are NOT DETECTED. The SARS-CoV-2 RNA is generally detectable in upper and lower respiratory specimens during the acute phase of infection. Negative results do not preclude SARS-CoV-2 infection, do not rule out co-infections with other pathogens, and should not be used  as the sole basis for treatment or other patient management decisions. Negative results must be combined with clinical observations, patient history, and epidemiological information. The expected result is Negative. Fact Sheet for Patients: SugarRoll.be Fact Sheet for Healthcare Providers: https://www.woods-mathews.com/ This test is not yet approved or cleared by the [place] FDA and  has been authorized for detection and/or diagnosis of SARS-CoV-2 by FDA under an Emergency Use Authorization (EUA). This EUA will remain  in effect (meaning this test can be used) for the duration of the COVID-19 declaration under Section 56 4(b)(1) of the Act, 21 U.S.C. section 360bbb-3(b)(1), unless the authorization is terminated or revoked sooner. Performed at [facility],  [address], [place], [place] [address]   SARS Coronavirus 2 (CEPHEID - Performed in [place] hospital lab), Hosp Order     Status: None   Collection Time: 12/11/18 11:09 PM   Specimen: Nasopharyngeal Swab  Result Value Ref Range Status   SARS Coronavirus 2 NEGATIVE NEGATIVE Final    Comment: (NOTE) If result is NEGATIVE SARS-CoV-2 target nucleic acids are NOT DETECTED. The SARS-CoV-2 RNA is generally detectable in upper and lower  respiratory specimens during the acute phase of infection. The lowest  concentration of SARS-CoV-2 viral copies this assay can detect is 250  copies / mL. A negative result does not preclude SARS-CoV-2 infection  and should not be used as the sole basis for treatment or other  patient management decisions.  A negative result may occur with  improper specimen collection / handling, submission of specimen other  than nasopharyngeal swab, presence of viral mutation(s) within the  areas targeted by this assay, and inadequate number of viral copies  (<250 copies / mL). A negative result must be combined with clinical  observations, patient  history, and epidemiological information. If result is POSITIVE SARS-CoV-2 target nucleic acids are DETECTED. The SARS-CoV-2 RNA is generally detectable in upper and lower  respiratory specimens dur ing the acute phase of infection.  Positive  results are indicative of active infection with SARS-CoV-2.  Clinical  correlation with patient history and other diagnostic information is  necessary to determine patient infection status.  Positive results do  not rule out bacterial infection or co-infection with other viruses. If result is PRESUMPTIVE POSTIVE SARS-CoV-2 nucleic acids MAY BE PRESENT.   A presumptive positive result was obtained on the submitted specimen  and confirmed on repeat testing.  While 2019 novel coronavirus  (SARS-CoV-2) nucleic acids may be present in the submitted sample  additional confirmatory testing may be necessary for epidemiological  and / or clinical management purposes  to differentiate between  SARS-CoV-2 and other Sarbecovirus currently known to infect humans.  If clinically indicated additional testing with an alternate test  methodology [phone]) is advised. The SARS-CoV-2 RNA is generally  detectable in upper and lower respiratory sp ecimens during the acute  phase of infection. The expected result is Negative. Fact Sheet for Patients:  StrictlyIdeas.no Fact Sheet for Healthcare Providers: BankingDealers.co.za This test is not yet approved or cleared by the [place] FDA and has been authorized for detection and/or diagnosis of SARS-CoV-2 by FDA under an Emergency Use Authorization (EUA).  This EUA will remain in effect (meaning this test can be used) for the duration of the COVID-19 declaration under Section 564(b)(1) of the Act, 21 U.S.C. section 360bbb-3(b)(1), unless the authorization is terminated or revoked sooner. Performed at [facility], [place] [address], [place],  [place] [address]     Medical History: Past Medical History:  Diagnosis Date  . Anxiety   . Cancer of ascending colon ([place])   . Hypertension   . Seasonal allergies     Medications:  (Not in a hospital admission)  Scheduled:  . amLODipine  10 mg Oral Daily  . budesonide (PULMICORT) nebulizer solution  0.25 mg Nebulization BID  . [START ON 12/13/2018] fentaNYL  1 patch Transdermal Q48H  . [START ON 12/13/2018] fentaNYL  1 patch Transdermal Q48H  . heparin  5,000 Units Subcutaneous Q8H  . LORazepam  1 mg Oral QHS  . multivitamin with minerals  1 tablet Oral Daily  . oxyCODONE  20 mg Oral BID  .  trifluridine-tipiracil  35 mg/m2 trifluridine (Adjusted) Oral BID PC  . venlafaxine XR  150 mg Oral Q breakfast    Assessment: Patient with active infection.  Trifluridine and tipiracil ([place]) . ANC < 500/mm3 or febrile neutropenia . Platelets < 50,000  . Grade 3 or 4 non-hematologic toxicity . Active infection   Goal of Therapy:  Safe and effective use of Trifluridine and tipiracil ([place])  Plan:  D/c Trifluridine and tipiracil ([place]) at this time.  Tyler Deis, Shea Stakes Crowford 12/12/2018,1:31 AM

## 2018-12-12 NOTE — Progress Notes (Signed)
Pharmacy Antibiotic Note  Nicholas Lowery is a 58 y.o. male admitted on 12/11/2018 with Intra-abdominal infection.  Pharmacy has been consulted for Levofloxacin, flagyl dosing.  Plan: Levofloxacin 750mg  iv q24hr Flagyl 500mg  iv q8hr      Temp (24hrs), Avg:97.6 F (36.4 C), Min:97.6 F (36.4 C), Max:97.6 F (36.4 C)  Recent Labs  Lab 12/05/18 0812 12/11/18 1918 12/11/18 2309 12/12/18 0242  WBC  --  13.2*  --  7.9  CREATININE 1.32* 1.32*  --  1.27*  LATICACIDVEN  --   --  0.8 0.8    Estimated Creatinine Clearance: 67.9 mL/min (A) (by C-G formula based on SCr of 1.27 mg/dL (H)).    Allergies  Allergen Reactions  . Penicillins Other (See Comments)    UNSPECIFIED REACTION   Has patient had a PCN reaction causing immediate rash, facial/tongue/throat swelling, SOB or lightheadedness with hypotension: unknown Has patient had a PCN reaction causing severe rash involving mucus membranes or skin necrosis: unknown Has patient had a PCN reaction that required hospitalization unknown Has patient had a PCN reaction occurring within the last 10 years: No If all of the above answers are "NO", then may proceed with Cephalosporin use.   [name] [Capecitabine] Nausea And Vomiting and Other (See Comments)    Foot pain, patient couldn't walk, and bleeding gums  . Nivolumab Nausea And Vomiting, Palpitations and Other (See Comments)    Infusion reaction: Chills, rigors, numbness. Symptoms improved with ondansetron, clonidine, famotidine, meperidine    Antimicrobials this admission: Levofloxacin 12/11/2018 >> Flagyl 12/11/2018 >>   Dose adjustments this admission: -  Microbiology results: -  Thank you for allowing pharmacy to be a part of this patient's care.  Nicholas Lowery 12/12/2018 4:36 AM

## 2018-12-12 NOTE — ED Notes (Signed)
Pt placed in hospital bed

## 2018-12-13 ENCOUNTER — Ambulatory Visit: Payer: BC Managed Care – PPO

## 2018-12-13 ENCOUNTER — Other Ambulatory Visit: Payer: BC Managed Care – PPO

## 2018-12-13 ENCOUNTER — Inpatient Hospital Stay (HOSPITAL_COMMUNITY): Payer: BC Managed Care – PPO

## 2018-12-13 ENCOUNTER — Ambulatory Visit: Payer: BC Managed Care – PPO | Admitting: Hematology

## 2018-12-13 DIAGNOSIS — R188 Other ascites: Secondary | ICD-10-CM

## 2018-12-13 DIAGNOSIS — R180 Malignant ascites: Secondary | ICD-10-CM

## 2018-12-13 LAB — BASIC METABOLIC PANEL
Anion gap: 5 (ref 5–15)
BUN: 19 mg/dL (ref 6–20)
CO2: 22 mmol/L (ref 22–32)
Calcium: 7.5 mg/dL — ABNORMAL LOW (ref 8.9–10.3)
Chloride: 110 mmol/L (ref 98–111)
Creatinine, Ser: 1.28 mg/dL — ABNORMAL HIGH (ref 0.61–1.24)
GFR calc Af Amer: >60 mL/min (ref >60)
GFR calc non Af Amer: >60 mL/min (ref >60)
Glucose, Bld: 135 mg/dL — ABNORMAL HIGH (ref 70–99)
Potassium: 3.6 mmol/L (ref 3.5–5.1)
Sodium: 137 mmol/L (ref 135–145)

## 2018-12-13 LAB — BODY FLUID CELL COUNT WITH DIFFERENTIAL
Eos, Fluid: 0 %
Lymphs, Fluid: 24 %
Monocyte-Macrophage-Serous Fluid: 48 % — ABNORMAL LOW (ref 50–90)
Neutrophil Count, Fluid: 28 % — ABNORMAL HIGH (ref 0–25)
Total Nucleated Cell Count, Fluid: 423 cu mm (ref 0–1000)

## 2018-12-13 LAB — CBC
HCT: 31.5 % — ABNORMAL LOW (ref 39.0–52.0)
Hemoglobin: 9.6 g/dL — ABNORMAL LOW (ref 13.0–17.0)
MCH: 28 pg (ref 26.0–34.0)
MCHC: 30.5 g/dL (ref 30.0–36.0)
MCV: 91.8 fL (ref 80.0–100.0)
Platelets: 241 10*3/uL (ref 150–400)
RBC: 3.43 MIL/uL — ABNORMAL LOW (ref 4.22–5.81)
RDW: 15.1 % (ref 11.5–15.5)
WBC: 5.2 10*3/uL (ref 4.0–10.5)
nRBC: 0 % (ref 0.0–0.2)

## 2018-12-13 LAB — PROTEIN, PLEURAL OR PERITONEAL FLUID: Total protein, fluid: <3 g/dL

## 2018-12-13 LAB — LACTATE DEHYDROGENASE, PLEURAL OR PERITONEAL FLUID: LD, Fluid: 76 U/L — ABNORMAL HIGH (ref 3–23)

## 2018-12-13 LAB — ALBUMIN, PLEURAL OR PERITONEAL FLUID: Albumin, Fluid: 1.4 g/dL

## 2018-12-13 LAB — GLUCOSE, PLEURAL OR PERITONEAL FLUID: Glucose, Fluid: 113 mg/dL

## 2018-12-13 LAB — SARS CORONAVIRUS 2 BY RT PCR (HOSPITAL ORDER, PERFORMED IN ~~LOC~~ HOSPITAL LAB): SARS Coronavirus 2: NEGATIVE

## 2018-12-13 MED ORDER — SODIUM CHLORIDE 0.9% FLUSH
10.0000 mL | INTRAVENOUS | Status: DC | PRN
  Administered 2018-12-14: 10 mL
  Filled 2018-12-13: qty 40

## 2018-12-13 MED ORDER — SODIUM CHLORIDE 0.9% FLUSH
10.0000 mL | Freq: Two times a day (BID) | INTRAVENOUS | Status: DC
  Administered 2018-12-13 – 2018-12-14 (×2): 10 mL

## 2018-12-13 MED ORDER — ALBUTEROL SULFATE (2.5 MG/3ML) 0.083% IN NEBU
2.5000 mg | INHALATION_SOLUTION | Freq: Two times a day (BID) | RESPIRATORY_TRACT | Status: DC
  Administered 2018-12-13 – 2018-12-14 (×2): 2.5 mg via RESPIRATORY_TRACT
  Filled 2018-12-13 (×2): qty 3

## 2018-12-13 MED ORDER — LIDOCAINE HCL 1 % IJ SOLN
INTRAMUSCULAR | Status: AC
  Administered 2018-12-13: 16:00:00
  Filled 2018-12-13: qty 10

## 2018-12-13 NOTE — Progress Notes (Addendum)
[facility]) hospital liaison note.  This patient is enrolled in our palliative care services in the community.  [facility] will continue to follow for any discharge planning needs and to coordinate continuation of palliative care  Thank you Farrel Gordon, RN, CCM [place] ( listed on [place]) [phone]

## 2018-12-13 NOTE — Progress Notes (Signed)
[facility]                                                                              Patient Demographics  Nicholas Lowery, is a 58 y.o. male, DOB - 1961/02/03, NWG:[id number]  Admit date - 12/11/2018   Admitting Physician Rometta Emery, MD  Outpatient Primary MD for the patient is Burdine, Ananias Pilgrim, MD  Outpatient specialists:   LOS - 2  days   Medical records reviewed and are as summarized below:    Chief Complaint  Patient presents with   Diarrhea   Tachycardia       Brief summary   Patient is a 58 year old male with colon cancer with metastasis to the liver, hypertension, anxiety disorder, recurrent diarrhea as well as chronic pain from cancer who presented to the ER  with diarrhea and generalized weakness.  Patient has been on high-dose narcotics and apparently been constipated for a while.  He has been taking laxatives lately to relieve his constipation.  He however started having diarrhea a day before the admission which has not stopped.  He has not taken any laxatives.  Patient also has weakness and not even adequately.  He was seen in the ER tachypneic with CT scan showing possible right sided colitis.  He was having 7 out of 10 abdominal pain localized to the area.  Patient is therefore being admitted for colitis questionable infectious versus ischemic.  He is afebrile.  COVID-19 test negative  Assessment & Plan    Principal Problem: Acute enteritis of infectious origin -Possibly infectious.  CT scan showed possible right-sided colitis -Patient also reported being on antibiotics for sepsis and pneumonia when he was admitted 7/12 to 7/15 and was discharged home on levofloxacin -Allergic to penicillins, continue IV Levaquin and Flagyl -Patient had no bowel movement in the last 24 hours, C. difficile canceled, cancel enteric precautions. -COVID-19 test negative -Continue IV fluids, antiemetics.  Tolerating solid diet without any  difficulty.  Active Problems:   Metastatic colon cancer to liver [facility]) -Continue outpatient follow-up with oncology -Highly appreciate Dr. Latanya Maudlin recommendations    HTN (hypertension) -BP currently soft, continue amlodipine, hold ACE inhibitor  Right pleural effusion, ascites - Chronic, had recent thoracentesis - Patient is wondering if repeat thoracentesis or paracentesis will be done, has multiple questions regarding malignancy  - CT abdomen pelvis on 7/29 showed ill-defined mesenteric soft tissue mass near surgical anastomosis with encasement of the distal SMA and SMV vessels.  Moderate to large volume of ascites, moderate right pleural effusion. - Paracentesis were deferred on 7/20, underwent right thoracentesis on 7/13 yielding 1.3 L fluid, ultrasound-guided right thoracentesis again on 7/27 yielding 2.1 L fluid - d/w patient, agrees with ultrasound-guided paracentesis  Code Status: DNR DVT Prophylaxis: Heparin Family Communication: Discussed in detail with the patient, all imaging results, lab results explained to the patient and wife   Disposition Plan:   Time Spent in minutes   35 minutes  Procedures:  None  Consultants:   None  Antimicrobials:   Anti-infectives (From admission, onward)   Start     Dose/Rate Route Frequency Ordered Stop   12/11/18  2330  levofloxacin (LEVAQUIN) IVPB 750 mg     750 mg 100 mL/hr over 90 Minutes Intravenous Daily at bedtime 12/11/18 2322     12/11/18 2330  metroNIDAZOLE (FLAGYL) IVPB 500 mg     500 mg 100 mL/hr over 60 Minutes Intravenous Every 8 hours 12/11/18 2322           Medications  Scheduled Meds:  albuterol  2.5 mg Nebulization BID   amLODipine  10 mg Oral Daily   budesonide (PULMICORT) nebulizer solution  0.25 mg Nebulization BID   fentaNYL  1 patch Transdermal Q48H   fentaNYL  1 patch Transdermal Q48H   heparin  5,000 Units Subcutaneous Q8H   LORazepam  1 mg Oral QHS   multivitamin with minerals  1  tablet Oral Daily   oxyCODONE  20 mg Oral BID   sodium chloride flush  10-40 mL Intracatheter Q12H   venlafaxine XR  150 mg Oral Q breakfast   Continuous Infusions:  dextrose 5 % and 0.9% NaCl 100 mL/hr at 12/13/18 1120   levofloxacin (LEVAQUIN) IV Stopped (12/12/18 2317)   metronidazole 500 mg (12/13/18 1318)   PRN Meds:.albuterol, cyclobenzaprine, gabapentin, naloxone, ondansetron **OR** ondansetron (ZOFRAN) IV, ondansetron, ondansetron, polyethylene glycol, prochlorperazine, senna-docusate, sodium chloride flush, zolpidem      Subjective:   Nicholas Lowery was seen and examined today.  States had an episode of vomiting earlier this morning at 4:30 AM.  No diarrhea this morning.  Abdominal pain 6/10, controlled with pain medications.  No fevers. Patient denies dizziness, chest pain, shortness of breath.   Objective:   Vitals:   12/12/18 2009 12/12/18 2041 12/13/18 0514 12/13/18 0749  BP:  111/81 108/85   Pulse:  (!) 107 95   Resp:  15 18   Temp:  98 F (36.7 C) 98 F (36.7 C)   TempSrc:  Oral Oral   SpO2: 94% 95% 96% 93%  Weight:      Height:        Intake/Output Summary (Last 24 hours) at 12/13/2018 1425 Last data filed at 12/13/2018 0600 Gross per 24 hour  Intake 1945.26 ml  Output 200 ml  Net 1745.26 ml     Wt Readings from Last 3 Encounters:  12/12/18 82.1 kg  12/05/18 82.1 kg  12/02/18 83.1 kg   Physical Exam  General: Alert and oriented x 3, NAD  Eyes:   HEENT:  Atraumatic, normocephalic  Cardiovascular: S1 S2 clear, no murmurs, RRR. No pedal edema b/l  Respiratory: Decreased breath sound at the bases  Gastrointestinal: Soft, mild diffuse TTP, distended, NBS  Ext: no pedal edema bilaterally  Neuro: no new deficits  Musculoskeletal: No cyanosis, clubbing  Skin: No rashes  Psych: Normal affect and demeanor, alert and oriented x3      Data Reviewed:  I have personally reviewed following labs and imaging studies  Micro  Results Recent Results (from the past 240 hour(s))  SARS Coronavirus 2 (Performed in [facility] hospital lab)     Status: None   Collection Time: 12/07/18  2:33 PM   Specimen: Nasal Swab  Result Value Ref Range Status   SARS Coronavirus 2 NEGATIVE NEGATIVE Final    Comment: (NOTE) SARS-CoV-2 target nucleic acids are NOT DETECTED. The SARS-CoV-2 RNA is generally detectable in upper and lower respiratory specimens during the acute phase of infection. Negative results do not preclude SARS-CoV-2 infection, do not rule out co-infections with other pathogens, and should not be used as the sole basis for treatment  or other patient management decisions. Negative results must be combined with clinical observations, patient history, and epidemiological information. The expected result is Negative. Fact Sheet for Patients: HairSlick.no Fact Sheet for Healthcare Providers: quierodirigir.com This test is not yet approved or cleared by the [place] FDA and  has been authorized for detection and/or diagnosis of SARS-CoV-2 by FDA under an Emergency Use Authorization (EUA). This EUA will remain  in effect (meaning this test can be used) for the duration of the COVID-19 declaration under Section 56 4(b)(1) of the Act, 21 U.S.C. section 360bbb-3(b)(1), unless the authorization is terminated or revoked sooner. Performed at [facility], [address], [place], [place] [address]   SARS Coronavirus 2 (CEPHEID - Performed in [facility] hospital lab), Hosp Order     Status: None   Collection Time: 12/11/18 11:09 PM   Specimen: Nasopharyngeal Swab  Result Value Ref Range Status   SARS Coronavirus 2 NEGATIVE NEGATIVE Final    Comment: (NOTE) If result is NEGATIVE SARS-CoV-2 target nucleic acids are NOT DETECTED. The SARS-CoV-2 RNA is generally detectable in upper and lower  respiratory specimens during the acute phase of infection. The  lowest  concentration of SARS-CoV-2 viral copies this assay can detect is 250  copies / mL. A negative result does not preclude SARS-CoV-2 infection  and should not be used as the sole basis for treatment or other  patient management decisions.  A negative result may occur with  improper specimen collection / handling, submission of specimen other  than nasopharyngeal swab, presence of viral mutation(s) within the  areas targeted by this assay, and inadequate number of viral copies  (<250 copies / mL). A negative result must be combined with clinical  observations, patient history, and epidemiological information. If result is POSITIVE SARS-CoV-2 target nucleic acids are DETECTED. The SARS-CoV-2 RNA is generally detectable in upper and lower  respiratory specimens dur ing the acute phase of infection.  Positive  results are indicative of active infection with SARS-CoV-2.  Clinical  correlation with patient history and other diagnostic information is  necessary to determine patient infection status.  Positive results do  not rule out bacterial infection or co-infection with other viruses. If result is PRESUMPTIVE POSTIVE SARS-CoV-2 nucleic acids MAY BE PRESENT.   A presumptive positive result was obtained on the submitted specimen  and confirmed on repeat testing.  While 2019 novel coronavirus  (SARS-CoV-2) nucleic acids may be present in the submitted sample  additional confirmatory testing may be necessary for epidemiological  and / or clinical management purposes  to differentiate between  SARS-CoV-2 and other Sarbecovirus currently known to infect humans.  If clinically indicated additional testing with an alternate test  methodology [phone]) is advised. The SARS-CoV-2 RNA is generally  detectable in upper and lower respiratory sp ecimens during the acute  phase of infection. The expected result is Negative. Fact Sheet for Patients:   BoilerBrush.com.cy Fact Sheet for Healthcare Providers: https://pope.com/ This test is not yet approved or cleared by the [place] FDA and has been authorized for detection and/or diagnosis of SARS-CoV-2 by FDA under an Emergency Use Authorization (EUA).  This EUA will remain in effect (meaning this test can be used) for the duration of the COVID-19 declaration under Section 564(b)(1) of the Act, 21 U.S.C. section 360bbb-3(b)(1), unless the authorization is terminated or revoked sooner. Performed at [facility], [address] [place], [place] [address]     Radiology Reports Dg Chest 1 View  Result Date: 12/09/2018 CLINICAL DATA:  Post rt lung thora, in IR, pt waiting, feels better, no quite so sob mk rt-r EXAM: CHEST  1 VIEW COMPARISON:  12/05/2018 FINDINGS: No pneumothorax. Small residual right pleural effusion. Improved aeration at the right lung base with some residual interstitial and airspace opacities medially. Left lung clear. Heart size and mediastinal contours are within normal limits. Left subclavian port catheter to the distal SVC as before. Visualized bones unremarkable. IMPRESSION: No pneumothorax post right thoracentesis. Electronically Signed   By: Corlis Leak M.D.   On: 12/09/2018 15:31   Dg Chest 1 View  Result Date: 12/05/2018 CLINICAL DATA:  Evaluate right pleural effusion. Metastatic colon carcinoma. EXAM: CHEST  1 VIEW COMPARISON:  11/25/2018 FINDINGS: By pleural effusion has increased, moderate size, now obscuring the right hemidiaphragm. There is opacity at the right lung base consistent with atelectasis. Remainder of the lungs is clear. No left pleural effusion. Cardiac silhouette is normal in size. No mediastinal or hilar masses. Left anterior chest wall Port-A-Cath is stable. Skeletal structures are grossly intact. IMPRESSION: 1. Increased size of the right pleural effusion, now moderate and  obscuring the right hemidiaphragm. There is associated right lung base opacity most likely atelectasis. 2. Remainder of the lungs is clear.  No left pleural effusion. Electronically Signed   By: Amie Portland M.D.   On: 12/05/2018 14:40   Dg Chest 1 View  Result Date: 11/25/2018 CLINICAL DATA:  Status post RIGHT thoracentesis. EXAM: CHEST  1 VIEW COMPARISON:  None. FINDINGS: The RIGHT pleural effusion has decreased in size. Mild RIGHT basilar atelectasis persists. There is no evidence of pneumothorax. A LEFT Port-A-Cath is again noted. The LEFT lung is clear. IMPRESSION: Decreased RIGHT pleural effusion with mild persistent RIGHT basilar atelectasis. No pneumothorax. Electronically Signed   By: Harmon Pier M.D.   On: 11/25/2018 14:07   Ct Angio Chest Pe W Or Wo Contrast  Result Date: 11/24/2018 CLINICAL DATA:  History of metastatic colon carcinoma with fever and abdominal pain EXAM: CT ANGIOGRAPHY CHEST CT ABDOMEN AND PELVIS WITH CONTRAST TECHNIQUE: Multidetector CT imaging of the chest was performed using the standard protocol during bolus administration of intravenous contrast. Multiplanar CT image reconstructions and MIPs were obtained to evaluate the vascular anatomy. Multidetector CT imaging of the abdomen and pelvis was performed using the standard protocol during bolus administration of intravenous contrast. CONTRAST:  80mL OMNIPAQUE IOHEXOL 350 MG/ML SOLN COMPARISON:  Similar exam dated 11/13/2018 FINDINGS: CTA CHEST FINDINGS Cardiovascular: Thoracic aorta demonstrates a normal branching pattern. No aneurysmal dilatation or dissection is seen. The heart is normal in size and configuration. The pulmonary artery shows a normal branching pattern. No definitive filling defect to suggest pulmonary embolism is noted. Mediastinum/Nodes: Thoracic inlet is within normal limits. The esophagus is within normal limits. Scattered mediastinal lymph nodes are again identified similar to that seen on the recent  restaging examination from 11 days previous. Stable right hilar lymph node is noted as well. Lungs/Pleura: The lungs are well aerated bilaterally. Nodules are again identified bilaterally. The overall appearance is stable from the prior exam from 11 days previous. An enlarging right-sided pleural effusion is seen. New superimposed right lower lobe and right middle lobe infiltrate is seen. Musculoskeletal: Degenerative changes of the thoracic spine are noted. No metastatic lesions are seen. Left-sided chest wall port is noted Review of the MIP images confirms the above findings. CT ABDOMEN and PELVIS FINDINGS Hepatobiliary: Liver again demonstrates a hypodense lesion within the right lobe consistent  with metastatic disease measuring 4.2 cm in greatest dimension. Small hypodensity in the dome of the liver is noted also stable from the prior study. Gallbladder demonstrates vicarious excretion of contrast. No ductal dilatation is seen. Pancreas: Unremarkable. No pancreatic ductal dilatation or surrounding inflammatory changes. Spleen: Normal in size without focal abnormality. Adrenals/Urinary Tract: Adrenal glands are within normal limits. Kidneys demonstrate no renal calculi or obstructive changes bilaterally. Normal excretion of contrast is seen. The bladder is decompressed. Stomach/Bowel: Colon is well visualized. Postsurgical changes are noted in the mid transverse colon consistent with the prior surgical history. There is again noted soft tissue mass in the mesentery adjacent to the anastomotic site best seen in the coronal plane on image number 40 of series 4. Just proximal to the small bowel colon anastomotic site there is some mild dilatation of the distal small bowel identified. Multiple edematous loops of small bowel are noted proximal to the anastomotic site. A mild partial small bowel obstruction at the anastomotic site cannot be totally excluded. Vascular/Lymphatic: No aneurysmal dilatation is seen. The  encasement of the superior mesenteric artery and vein by the adjacent mesenteric mass is less well visualized on today's exam due to the timing of the contrast bolus. There does remain however venous engorgement of the small bowel contributing to the degree of small bowel wall edema and likely ascites. Reproductive: Prostate is unremarkable. Other: Increase in ascites is noted both in the pelvis as well as surrounding the bowel loops and liver. Peritoneal implant is again noted near the umbilicus. Musculoskeletal: Degenerative changes of lumbar spine are seen. Review of the MIP images confirms the above findings. IMPRESSION: CTA of the chest: No evidence of pulmonary emboli are identified. Stable right hilar and mediastinal adenopathy is seen. Stable metastatic lesions within both lungs. Increasing right-sided pleural effusion with associated right lower and middle lobe infiltrates CT of the abdomen and pelvis: Postsurgical changes consistent with the given clinical history. There remains a mesenteric mass near the anastomotic site similar to that seen on the recent exam. Stable appearing hepatic metastatic disease is noted. Persistent venous engorgement is noted related to vascular encasement with new small bowel edematous changes identified. Some mild dilatation of the distal small bowel proximal to the anastomosis is seen. The possibility of mild narrowing at the anastomosis deserves consideration. This was not present on the most recent exam. This may be accentuated by the venous engorgement and small-bowel wall edema. Increasing ascites. There are changes consistent with peritoneal implantation stable from the prior exam. Electronically Signed   By: Alcide Clever M.D.   On: 11/24/2018 18:13   Ct Abdomen Pelvis W Contrast  Result Date: 12/11/2018 CLINICAL DATA:  Diarrhea EXAM: CT ABDOMEN AND PELVIS WITH CONTRAST TECHNIQUE: Multidetector CT imaging of the abdomen and pelvis was performed using the standard  protocol following bolus administration of intravenous contrast. CONTRAST:  OMNIPAQUE IOHEXOL 300 MG/ML  SOLN COMPARISON:  CT 11/24/2018, 08/29/2018 FINDINGS: Lower chest: Lung bases demonstrate moderate right-sided pleural effusion. Innumerable metastatic pulmonary nodules are again visualized. Peribronchial thickening at the right middle lobe and right lung base. Partial atelectasis or pneumonia at the right base. Hepatobiliary: Multiple hypodense liver nodules consistent with metastatic disease. Index lesion in the inferior right hepatic lobe measures 5.5 cm, compared with 4.3 cm previously. Increased conspicuity of other metastatic nodules in the liver. No calcified gallstone or biliary dilatation Pancreas: Unremarkable. No pancreatic ductal dilatation or surrounding inflammatory changes. Spleen: Normal in size without focal abnormality. Adrenals/Urinary Tract: Adrenal  glands are normal. Kidneys show no hydronephrosis. The bladder is unremarkable Stomach/Bowel: The stomach is nonenlarged. Abnormal segmental small bowel thickening in the right hemiabdomen. No intramural air. Status post partial colectomy Vascular/Lymphatic: Nonaneurysmal aorta. Stable subcentimeter retroperitoneal nodes. Increased intraperitoneal nodules near the umbilical region, measuring up to 14 mm. Reproductive: Slightly enlarged prostate with calcification Other: No free air. Moderate to large volume of ascites in the abdomen and pelvis, increased compared to prior. Ill-defined soft tissue density within the central mesentery adjacent to surgical anastomosis. This appears to encase the distal SMA and SMV. Musculoskeletal: No acute or suspicious osseous abnormality. IMPRESSION: 1. Segmental small bowel wall thickening within the right hemiabdomen which may be secondary to infection or ischemia, less likely inflammatory bowel disease. Bowel wall thickening appears worse when compared to the most recent CT from November 24, 2018. The segment  of thickened small bowel is also fluid filled and there may be a degree of obstruction, possibly related to surgical anastomosis. 2. Moderate right pleural effusion. Numerous metastatic pulmonary nodules as before. 3. Increased size of inferior right hepatic lobe metastatic lesion with increased consult conspicuity of additional hypodense liver lesions, consistent with progression of metastatic disease. 4. Ill-defined mesenteric soft tissue mass near the surgical anastomosis, with encasement of the distal SMA and SMV vessels 5. Moderate to large volume of ascites within the abdomen and pelvis, may be slightly increased. Increased peritoneal nodularity near the umbilicus Electronically Signed   By: Jasmine Pang M.D.   On: 12/11/2018 22:25   Ct Abdomen Pelvis W Contrast  Result Date: 11/24/2018 CLINICAL DATA:  History of metastatic colon carcinoma with fever and abdominal pain EXAM: CT ANGIOGRAPHY CHEST CT ABDOMEN AND PELVIS WITH CONTRAST TECHNIQUE: Multidetector CT imaging of the chest was performed using the standard protocol during bolus administration of intravenous contrast. Multiplanar CT image reconstructions and MIPs were obtained to evaluate the vascular anatomy. Multidetector CT imaging of the abdomen and pelvis was performed using the standard protocol during bolus administration of intravenous contrast. CONTRAST:  80mL OMNIPAQUE IOHEXOL 350 MG/ML SOLN COMPARISON:  Similar exam dated 11/13/2018 FINDINGS: CTA CHEST FINDINGS Cardiovascular: Thoracic aorta demonstrates a normal branching pattern. No aneurysmal dilatation or dissection is seen. The heart is normal in size and configuration. The pulmonary artery shows a normal branching pattern. No definitive filling defect to suggest pulmonary embolism is noted. Mediastinum/Nodes: Thoracic inlet is within normal limits. The esophagus is within normal limits. Scattered mediastinal lymph nodes are again identified similar to that seen on the recent restaging  examination from 11 days previous. Stable right hilar lymph node is noted as well. Lungs/Pleura: The lungs are well aerated bilaterally. Nodules are again identified bilaterally. The overall appearance is stable from the prior exam from 11 days previous. An enlarging right-sided pleural effusion is seen. New superimposed right lower lobe and right middle lobe infiltrate is seen. Musculoskeletal: Degenerative changes of the thoracic spine are noted. No metastatic lesions are seen. Left-sided chest wall port is noted Review of the MIP images confirms the above findings. CT ABDOMEN and PELVIS FINDINGS Hepatobiliary: Liver again demonstrates a hypodense lesion within the right lobe consistent with metastatic disease measuring 4.2 cm in greatest dimension. Small hypodensity in the dome of the liver is noted also stable from the prior study. Gallbladder demonstrates vicarious excretion of contrast. No ductal dilatation is seen. Pancreas: Unremarkable. No pancreatic ductal dilatation or surrounding inflammatory changes. Spleen: Normal in size without focal abnormality. Adrenals/Urinary Tract: Adrenal glands are within normal limits. Kidneys demonstrate  no renal calculi or obstructive changes bilaterally. Normal excretion of contrast is seen. The bladder is decompressed. Stomach/Bowel: Colon is well visualized. Postsurgical changes are noted in the mid transverse colon consistent with the prior surgical history. There is again noted soft tissue mass in the mesentery adjacent to the anastomotic site best seen in the coronal plane on image number 40 of series 4. Just proximal to the small bowel colon anastomotic site there is some mild dilatation of the distal small bowel identified. Multiple edematous loops of small bowel are noted proximal to the anastomotic site. A mild partial small bowel obstruction at the anastomotic site cannot be totally excluded. Vascular/Lymphatic: No aneurysmal dilatation is seen. The encasement of  the superior mesenteric artery and vein by the adjacent mesenteric mass is less well visualized on today's exam due to the timing of the contrast bolus. There does remain however venous engorgement of the small bowel contributing to the degree of small bowel wall edema and likely ascites. Reproductive: Prostate is unremarkable. Other: Increase in ascites is noted both in the pelvis as well as surrounding the bowel loops and liver. Peritoneal implant is again noted near the umbilicus. Musculoskeletal: Degenerative changes of lumbar spine are seen. Review of the MIP images confirms the above findings. IMPRESSION: CTA of the chest: No evidence of pulmonary emboli are identified. Stable right hilar and mediastinal adenopathy is seen. Stable metastatic lesions within both lungs. Increasing right-sided pleural effusion with associated right lower and middle lobe infiltrates CT of the abdomen and pelvis: Postsurgical changes consistent with the given clinical history. There remains a mesenteric mass near the anastomotic site similar to that seen on the recent exam. Stable appearing hepatic metastatic disease is noted. Persistent venous engorgement is noted related to vascular encasement with new small bowel edematous changes identified. Some mild dilatation of the distal small bowel proximal to the anastomosis is seen. The possibility of mild narrowing at the anastomosis deserves consideration. This was not present on the most recent exam. This may be accentuated by the venous engorgement and small-bowel wall edema. Increasing ascites. There are changes consistent with peritoneal implantation stable from the prior exam. Electronically Signed   By: Alcide Clever M.D.   On: 11/24/2018 18:13   US Abdomen Limited  Result Date: 12/02/2018 CLINICAL DATA:  Metastatic colon cancer to the liver. Follow-up ascites. EXAM: LIMITED ABDOMEN ULTRASOUND FOR ASCITES TECHNIQUE: Limited ultrasound survey for ascites was performed in all  four abdominal quadrants. COMPARISON:  Abdomen and pelvis CT dated 11/24/2018. FINDINGS: Moderate amount of free peritoneal fluid, increased since 11/24/2018. IMPRESSION: Moderate amount of ascites, increased. Electronically Signed   By: Beckie Salts M.D.   On: 12/02/2018 09:18   Dg Chest Port 1 View  Result Date: 11/24/2018 CLINICAL DATA:  Fever, cough, shortness of breath, nausea. EXAM: PORTABLE CHEST 1 VIEW COMPARISON:  November 05, 2018 FINDINGS: Left subclavian approach injectable port in stable position. Cardiomediastinal silhouette is mildly enlarged. Mediastinal contours appear intact. The left lung is clear. Right cardiophrenic airspace opacity and probable right pleural effusion. Previously noted by CT small pulmonary nodules are not seen radiographically. Osseous structures are without acute abnormality. Soft tissues are grossly normal. IMPRESSION: Right cardiophrenic airspace opacity and probable right pleural effusion. Electronically Signed   By: Ted Mcalpine M.D.   On: 11/24/2018 12:19   [place] Ascites (abdomen Limited)  Result Date: 12/02/2018 CLINICAL DATA:  58 year old male with colon cancer metastatic to the liver. EXAM: LIMITED ABDOMEN ULTRASOUND FOR ASCITES TECHNIQUE: Limited ultrasound  survey for ascites was performed in all four abdominal quadrants. COMPARISON:  CT scan of the abdomen and pelvis 11/24/2018 FINDINGS: Sonographic interrogation of the 4 quadrants of the abdomen demonstrates small volume intraperitoneal ascites. There is a moderately large right-sided pleural effusion. Fluid was deemed too scant for paracentesis. IMPRESSION: 1. Small volume ascites.  Paracentesis was deferred. 2. Moderate right pleural effusion. Electronically Signed   By: Malachy Moan M.D.   On: 12/02/2018 16:20   Ir Thoracentesis Asp Pleural Space W/img Guide  Result Date: 12/09/2018 INDICATION: Patient with history of metastatic colon cancer, recurrent right-sided pleural effusion. Request to  IR therapeutic right-sided thoracentesis. EXAM: ULTRASOUND GUIDED right THORACENTESIS MEDICATIONS: 10 mL 1% lidocaine COMPLICATIONS: None immediate. PROCEDURE: An ultrasound guided thoracentesis was thoroughly discussed with the patient and questions answered. The benefits, risks, alternatives and complications were also discussed. The patient understands and wishes to proceed with the procedure. Written consent was obtained. Ultrasound was performed to localize and mark an adequate pocket of fluid in the right chest. The area was then prepped and draped in the normal sterile fashion. 1% Lidocaine was used for local anesthesia. Under ultrasound guidance a 6 Fr Safe-T-Centesis catheter was introduced. Thoracentesis was performed. The catheter was removed and a dressing applied. FINDINGS: A total of approximately 2.1 L of clear yellow fluid was removed. IMPRESSION: Successful ultrasound guided right thoracentesis yielding 2.1 L of pleural fluid. Read by Lynnette Caffey, PA-C Electronically Signed   By: Richarda Overlie M.D.   On: 12/09/2018 15:01   US Thoracentesis Asp Pleural Space W/img Guide  Result Date: 11/25/2018 INDICATION: Patient with history of shortness of breath, pneumonia, right pleural effusion. Request made for diagnostic and therapeutic thoracentesis. EXAM: ULTRASOUND GUIDED RIGHT DIAGNOSTIC AND THERAPEUTIC THORACENTESIS MEDICATIONS: 10 mL 1% lidocaine COMPLICATIONS: None immediate. PROCEDURE: An ultrasound guided thoracentesis was thoroughly discussed with the patient and questions answered. The benefits, risks, alternatives and complications were also discussed. The patient understands and wishes to proceed with the procedure. Written consent was obtained. Ultrasound was performed to localize and mark an adequate pocket of fluid in the right chest. The area was then prepped and draped in the normal sterile fashion. 1% Lidocaine was used for local anesthesia. Under ultrasound guidance a 6 Fr  Safe-T-Centesis catheter was introduced. Thoracentesis was performed. The catheter was removed and a dressing applied. FINDINGS: A total of approximately 1.3 liters of clear, yellow fluid was removed. Samples were sent to the laboratory as requested by the clinical team. IMPRESSION: Successful ultrasound guided diagnostic and therapeutic right thoracentesis yielding 1.3 liters of pleural fluid. Read by: Loyce Dys PA-C Electronically Signed   By: Richarda Overlie M.D.   On: 11/25/2018 14:33    Lab Data:  CBC: Recent Labs  Lab 12/11/18 1918 12/12/18 0242 12/12/18 0535 12/13/18 1020  WBC 13.2* 7.9 7.5 5.2  HGB 12.9* 11.2* 11.0* 9.6*  HCT 41.9 35.8* 34.9* 31.5*  MCV 89.3 90.6 91.1 91.8  PLT 494* 326 304 241   Basic Metabolic Panel: Recent Labs  Lab 12/11/18 1918 12/12/18 0242 12/12/18 0535 12/13/18 1020  NA 137  --  136 137  K 4.1  --  4.3 3.6  CL 102  --  102 110  CO2 21*  --  25 22  GLUCOSE 141*  --  108* 135*  BUN 17  --  18 19  CREATININE 1.32* 1.27* 1.33* 1.28*  CALCIUM 9.0  --  8.1* 7.5*   GFR: Estimated Creatinine Clearance: 67.3 mL/min (A) (by C-G  formula based on SCr of 1.28 mg/dL (H)). Liver Function Tests: Recent Labs  Lab 12/11/18 1918 12/12/18 0535  AST 22 17  ALT 15 13  ALKPHOS 72 61  BILITOT 0.9 0.6  PROT 6.7 5.5*  ALBUMIN 3.4* 2.7*   Recent Labs  Lab 12/11/18 1918  LIPASE 23   No results for input(s): AMMONIA in the last 168 hours. Coagulation Profile: No results for input(s): INR, PROTIME in the last 168 hours. Cardiac Enzymes: No results for input(s): CKTOTAL, CKMB, CKMBINDEX, TROPONINI in the last 168 hours. BNP (last 3 results) No results for input(s): PROBNP in the last 8760 hours. HbA1C: No results for input(s): HGBA1C in the last 72 hours. CBG: No results for input(s): GLUCAP in the last 168 hours. Lipid Profile: No results for input(s): CHOL, HDL, LDLCALC, TRIG, CHOLHDL, LDLDIRECT in the last 72 hours. Thyroid Function Tests: No  results for input(s): TSH, T4TOTAL, FREET4, T3FREE, THYROIDAB in the last 72 hours. Anemia Panel: No results for input(s): VITAMINB12, FOLATE, FERRITIN, TIBC, IRON, RETICCTPCT in the last 72 hours. Urine analysis:    Component Value Date/Time   COLORURINE YELLOW 12/12/2018 0242   APPEARANCEUR CLEAR 12/12/2018 0242   LABSPEC >1.046 (H) 12/12/2018 0242   PHURINE 5.0 12/12/2018 0242   GLUCOSEU NEGATIVE 12/12/2018 0242   HGBUR NEGATIVE 12/12/2018 0242   BILIRUBINUR NEGATIVE 12/12/2018 0242   KETONESUR 5 (A) 12/12/2018 0242   PROTEINUR NEGATIVE 12/12/2018 0242   NITRITE NEGATIVE 12/12/2018 0242   LEUKOCYTESUR NEGATIVE 12/12/2018 0242     Catilyn Boggus M.D. [facility] 12/13/2018, 2:25 PM  Pager: [phone] Between 7am to 7pm - call Pager - [phone]  After 7pm go to [email or web address] - password [email or web address]  Call night coverage person covering after 7pm

## 2018-12-13 NOTE — Procedures (Signed)
Ultrasound-guided diagnostic and therapeutic paracentesis performed yielding 1.7 liters of yellow fluid. No immediate complications.  A portion of the fluid was submitted to the lab for preordered studies. EBL none.

## 2018-12-13 NOTE — Progress Notes (Addendum)
Nicholas Lowery   DOB:May 01, 1961   ZD#:[id number]   QVZ#:[id number]  Oncology follow up  Subjective: Reports ongoing abdominal discomfort.  States diarrhea has resolved.  GI panel has not been collected since diarrhea has resolved.  He has remained afebrile.  Other vital signs stable.  He feels though he needs more fluid taken off of his lung.  However, he denies worsening of his shortness of breath or cough.  He thinks the discomfort in his abdomen is related to a worsening pleural effusion.  He does not seem open to a paracentesis despite the fact that he was noted to have ascites on his CT of the abdomen and pelvis.   Objective:  Vitals:   12/13/18 0749 12/13/18 1300  BP:  110/79  Pulse:  88  Resp:  15  Temp:  98.1 F (36.7 C)  SpO2: 93% 98%    Body mass index is 25.97 kg/m.  Intake/Output Summary (Last 24 hours) at 12/13/2018 1516 Last data filed at 12/13/2018 0600 Gross per 24 hour  Intake 1945.26 ml  Output 200 ml  Net 1745.26 ml     Sclerae unicteric  Oropharynx clear  No peripheral adenopathy  Lungs clear -- no rales or rhonchi, decreased breath sound on lung bases   Heart regular rate and rhythm  Abdomen soft, mild distention, pain with light palpation.  MSK no focal spinal tenderness, no peripheral edema  Neuro nonfocal     CBG (last 3)  No results for input(s): GLUCAP in the last 72 hours.   Labs:  Lab Results  Component Value Date   WBC 5.2 12/13/2018   HGB 9.6 (L) 12/13/2018   HCT 31.5 (L) 12/13/2018   MCV 91.8 12/13/2018   PLT 241 12/13/2018   NEUTROABS 3.3 11/24/2018    Urine Studies No results for input(s): UHGB, CRYS in the last 72 hours.  Invalid input(s): UACOL, UAPR, USPG, UPH, UTP, UGL, UKET, UBIL, UNIT, UROB, ULEU, UEPI, UWBC, URBC, [place], [place], [place], [place]  Basic Metabolic Panel: Recent Labs  Lab 12/11/18 1918 12/12/18 0242 12/12/18 0535 12/13/18 1020  NA 137  --  136 137  K 4.1  --  4.3 3.6  CL 102  --  102 110  CO2 21*  --  25  22  GLUCOSE 141*  --  108* 135*  BUN 17  --  18 19  CREATININE 1.32* 1.27* 1.33* 1.28*  CALCIUM 9.0  --  8.1* 7.5*   GFR Estimated Creatinine Clearance: 67.3 mL/min (A) (by C-G formula based on SCr of 1.28 mg/dL (H)). Liver Function Tests: Recent Labs  Lab 12/11/18 1918 12/12/18 0535  AST 22 17  ALT 15 13  ALKPHOS 72 61  BILITOT 0.9 0.6  PROT 6.7 5.5*  ALBUMIN 3.4* 2.7*   Recent Labs  Lab 12/11/18 1918  LIPASE 23   No results for input(s): AMMONIA in the last 168 hours. Coagulation profile No results for input(s): INR, PROTIME in the last 168 hours.  CBC: Recent Labs  Lab 12/11/18 1918 12/12/18 0242 12/12/18 0535 12/13/18 1020  WBC 13.2* 7.9 7.5 5.2  HGB 12.9* 11.2* 11.0* 9.6*  HCT 41.9 35.8* 34.9* 31.5*  MCV 89.3 90.6 91.1 91.8  PLT 494* 326 304 241   Cardiac Enzymes: No results for input(s): CKTOTAL, CKMB, CKMBINDEX, TROPONINI in the last 168 hours. BNP: Invalid input(s): POCBNP CBG: No results for input(s): GLUCAP in the last 168 hours. D-Dimer No results for input(s): DDIMER in the last 72 hours. Hgb A1c No results  for input(s): HGBA1C in the last 72 hours. Lipid Profile No results for input(s): CHOL, HDL, LDLCALC, TRIG, CHOLHDL, LDLDIRECT in the last 72 hours. Thyroid function studies No results for input(s): TSH, T4TOTAL, T3FREE, THYROIDAB in the last 72 hours.  Invalid input(s): FREET3 Anemia work up No results for input(s): VITAMINB12, FOLATE, FERRITIN, TIBC, IRON, RETICCTPCT in the last 72 hours. Microbiology Recent Results (from the past 240 hour(s))  SARS Coronavirus 2 (Performed in [place] hospital lab)     Status: None   Collection Time: 12/07/18  2:33 PM   Specimen: Nasal Swab  Result Value Ref Range Status   SARS Coronavirus 2 NEGATIVE NEGATIVE Final    Comment: (NOTE) SARS-CoV-2 target nucleic acids are NOT DETECTED. The SARS-CoV-2 RNA is generally detectable in upper and lower respiratory specimens during the acute phase of  infection. Negative results do not preclude SARS-CoV-2 infection, do not rule out co-infections with other pathogens, and should not be used as the sole basis for treatment or other patient management decisions. Negative results must be combined with clinical observations, patient history, and epidemiological information. The expected result is Negative. Fact Sheet for Patients: SugarRoll.be Fact Sheet for Healthcare Providers: https://www.woods-mathews.com/ This test is not yet approved or cleared by the [place] FDA and  has been authorized for detection and/or diagnosis of SARS-CoV-2 by FDA under an Emergency Use Authorization (EUA). This EUA will remain  in effect (meaning this test can be used) for the duration of the COVID-19 declaration under Section 56 4(b)(1) of the Act, 21 U.S.C. section 360bbb-3(b)(1), unless the authorization is terminated or revoked sooner. Performed at [facility], [place] [address] [place], [place] [address]   SARS Coronavirus 2 (CEPHEID - Performed in [place] hospital lab), Hosp Order     Status: None   Collection Time: 12/11/18 11:09 PM   Specimen: Nasopharyngeal Swab  Result Value Ref Range Status   SARS Coronavirus 2 NEGATIVE NEGATIVE Final    Comment: (NOTE) If result is NEGATIVE SARS-CoV-2 target nucleic acids are NOT DETECTED. The SARS-CoV-2 RNA is generally detectable in upper and lower  respiratory specimens during the acute phase of infection. The lowest  concentration of SARS-CoV-2 viral copies this assay can detect is 250  copies / mL. A negative result does not preclude SARS-CoV-2 infection  and should not be used as the sole basis for treatment or other  patient management decisions.  A negative result may occur with  improper specimen collection / handling, submission of specimen other  than nasopharyngeal swab, presence of viral mutation(s) within the  areas targeted by this  assay, and inadequate number of viral copies  (<250 copies / mL). A negative result must be combined with clinical  observations, patient history, and epidemiological information. If result is POSITIVE SARS-CoV-2 target nucleic acids are DETECTED. The SARS-CoV-2 RNA is generally detectable in upper and lower  respiratory specimens dur ing the acute phase of infection.  Positive  results are indicative of active infection with SARS-CoV-2.  Clinical  correlation with patient history and other diagnostic information is  necessary to determine patient infection status.  Positive results do  not rule out bacterial infection or co-infection with other viruses. If result is PRESUMPTIVE POSTIVE SARS-CoV-2 nucleic acids MAY BE PRESENT.   A presumptive positive result was obtained on the submitted specimen  and confirmed on repeat testing.  While 2019 novel coronavirus  (SARS-CoV-2) nucleic acids may be present in the submitted sample  additional confirmatory testing may  be necessary for epidemiological  and / or clinical management purposes  to differentiate between  SARS-CoV-2 and other Sarbecovirus currently known to infect humans.  If clinically indicated additional testing with an alternate test  methodology [phone]) is advised. The SARS-CoV-2 RNA is generally  detectable in upper and lower respiratory sp ecimens during the acute  phase of infection. The expected result is Negative. Fact Sheet for Patients:  StrictlyIdeas.no Fact Sheet for Healthcare Providers: BankingDealers.co.za This test is not yet approved or cleared by the [place] FDA and has been authorized for detection and/or diagnosis of SARS-CoV-2 by FDA under an Emergency Use Authorization (EUA).  This EUA will remain in effect (meaning this test can be used) for the duration of the COVID-19 declaration under Section 564(b)(1) of the Act, 21 U.S.C. section 360bbb-3(b)(1),  unless the authorization is terminated or revoked sooner. Performed at [facility], [place] [address] [place], [place] [address]       Studies:  Ct Abdomen Pelvis W Contrast  Result Date: 12/11/2018 CLINICAL DATA:  Diarrhea EXAM: CT ABDOMEN AND PELVIS WITH CONTRAST TECHNIQUE: Multidetector CT imaging of the abdomen and pelvis was performed using the standard protocol following bolus administration of intravenous contrast. CONTRAST:  141mL OMNIPAQUE IOHEXOL 300 MG/ML  SOLN COMPARISON:  CT 11/24/2018, 08/29/2018 FINDINGS: Lower chest: Lung bases demonstrate moderate right-sided pleural effusion. Innumerable metastatic pulmonary nodules are again visualized. Peribronchial thickening at the right middle lobe and right lung base. Partial atelectasis or pneumonia at the right base. Hepatobiliary: Multiple hypodense liver nodules consistent with metastatic disease. Index lesion in the inferior right hepatic lobe measures 5.5 cm, compared with 4.3 cm previously. Increased conspicuity of other metastatic nodules in the liver. No calcified gallstone or biliary dilatation Pancreas: Unremarkable. No pancreatic ductal dilatation or surrounding inflammatory changes. Spleen: Normal in size without focal abnormality. Adrenals/Urinary Tract: Adrenal glands are normal. Kidneys show no hydronephrosis. The bladder is unremarkable Stomach/Bowel: The stomach is nonenlarged. Abnormal segmental small bowel thickening in the right hemiabdomen. No intramural air. Status post partial colectomy Vascular/Lymphatic: Nonaneurysmal aorta. Stable subcentimeter retroperitoneal nodes. Increased intraperitoneal nodules near the umbilical region, measuring up to 14 mm. Reproductive: Slightly enlarged prostate with calcification Other: No free air. Moderate to large volume of ascites in the abdomen and pelvis, increased compared to prior. Ill-defined soft tissue density within the central mesentery adjacent to surgical  anastomosis. This appears to encase the distal SMA and SMV. Musculoskeletal: No acute or suspicious osseous abnormality. IMPRESSION: 1. Segmental small bowel wall thickening within the right hemiabdomen which may be secondary to infection or ischemia, less likely inflammatory bowel disease. Bowel wall thickening appears worse when compared to the most recent CT from November 24, 2018. The segment of thickened small bowel is also fluid filled and there may be a degree of obstruction, possibly related to surgical anastomosis. 2. Moderate right pleural effusion. Numerous metastatic pulmonary nodules as before. 3. Increased size of inferior right hepatic lobe metastatic lesion with increased consult conspicuity of additional hypodense liver lesions, consistent with progression of metastatic disease. 4. Ill-defined mesenteric soft tissue mass near the surgical anastomosis, with encasement of the distal SMA and SMV vessels 5. Moderate to large volume of ascites within the abdomen and pelvis, may be slightly increased. Increased peritoneal nodularity near the umbilicus Electronically Signed   By: Donavan Foil M.D.   On: 12/11/2018 22:25    Assessment: 58 y.o.  Male with stage IV colon cancer, recently progressed, anxiety, depression, HTN, pleural effusion, presented  with worsening abdominal pain, nausea, and diarrhea for 1-2 days   1. Acute enteritis  2.  Metastatic colon cancer to liver, peritoneum, pleura, lung, nodes  3.  Abdominal pain secondary to cancer, on chronic narcotics 4.  Hypertension 5. Right pleural effusion and recent pneumonia, ascites  6. Deconditioning    Plan:  -continue supportive care  -He has progressed through multiple line chemotherapy, his overall prognosis is guarded, especially since his recently disease progression -he went to [facility] early this week to discuss clinical trial options, he is planning to start in 2 weeks. However I am concerned that his overall condition has deteriorated  due to his cancer progression, his performance status may deteriorate quickly, and he may not be qualify for phase 1 clinical trial -I have prescribed oral chemo drug Lonsurf, he has not started while he is being evaluated for phase 1 trial -I reviewed the CT of the abdomen and pelvis with the patient.  We discussed that his CT showed a moderate to large amount of ascites as well as a moderate pleural effusion.  I recommended for him to proceed with paracentesis today however, the patient does not want to proceed with paracentesis as they could not get any fluid off last time.  He would like to have a thoracentesis despite the fact that he is not having worsening of his shortness of breath or cough.  I have ordered a thoracentesis per his request. -The patient is a DNR/DNI.   Nicholas Bussing, NP 12/13/2018    Addendum  I have seen the patient, examined him. I agree with the assessment and and plan and have edited the notes.   Nicholas Lowery underwent paracentesis this afternoon and had 1.7 L fluids removed.  He hopes he will get a better sleep tonight.  His diarrhea has resolved. Giving the rapid improvement, his diarrhea could be related to laxities. Continue supportive care, and hopefully he can be discharged home tomorrow or Sunday. He has decided to start Lonsurf next week, I spoke with his wife today, she does not want him to participate the phase 1 trial at [facility] either. I will see him back tomorrow.  Truitt Merle  12/13/2018

## 2018-12-14 ENCOUNTER — Inpatient Hospital Stay (HOSPITAL_COMMUNITY): Payer: BC Managed Care – PPO

## 2018-12-14 DIAGNOSIS — R188 Other ascites: Secondary | ICD-10-CM

## 2018-12-14 DIAGNOSIS — I158 Other secondary hypertension: Secondary | ICD-10-CM

## 2018-12-14 LAB — CBC
HCT: 28.3 % — ABNORMAL LOW (ref 39.0–52.0)
Hemoglobin: 8.3 g/dL — ABNORMAL LOW (ref 13.0–17.0)
MCH: 27.3 pg (ref 26.0–34.0)
MCHC: 29.3 g/dL — ABNORMAL LOW (ref 30.0–36.0)
MCV: 93.1 fL (ref 80.0–100.0)
Platelets: 218 10*3/uL (ref 150–400)
RBC: 3.04 MIL/uL — ABNORMAL LOW (ref 4.22–5.81)
RDW: 15 % (ref 11.5–15.5)
WBC: 4 10*3/uL (ref 4.0–10.5)
nRBC: 0 % (ref 0.0–0.2)

## 2018-12-14 LAB — BASIC METABOLIC PANEL
Anion gap: 5 (ref 5–15)
BUN: 18 mg/dL (ref 6–20)
CO2: 25 mmol/L (ref 22–32)
Calcium: 8 mg/dL — ABNORMAL LOW (ref 8.9–10.3)
Chloride: 106 mmol/L (ref 98–111)
Creatinine, Ser: 1.21 mg/dL (ref 0.61–1.24)
GFR calc Af Amer: >60 mL/min (ref >60)
GFR calc non Af Amer: >60 mL/min (ref >60)
Glucose, Bld: 95 mg/dL (ref 70–99)
Potassium: 3.9 mmol/L (ref 3.5–5.1)
Sodium: 136 mmol/L (ref 135–145)

## 2018-12-14 LAB — GRAM STAIN

## 2018-12-14 MED ORDER — LIDOCAINE HCL 1 % IJ SOLN
INTRAMUSCULAR | Status: AC
  Filled 2018-12-14: qty 10

## 2018-12-14 MED ORDER — ONDANSETRON 8 MG PO TBDP: 8.0000 mg | ORAL_TABLET | Freq: Three times a day (TID) | ORAL | 0 refills | Status: AC | PRN

## 2018-12-14 MED ORDER — LEVOFLOXACIN 500 MG PO TABS: 500.0000 mg | ORAL_TABLET | Freq: Every day | ORAL | 0 refills | Status: AC

## 2018-12-14 MED ORDER — HEPARIN SOD (PORK) LOCK FLUSH 100 UNIT/ML IV SOLN
500.0000 [IU] | INTRAVENOUS | Status: AC | PRN
  Administered 2018-12-14: 17:00:00 500 [IU]

## 2018-12-14 MED ORDER — LEVOFLOXACIN 500 MG PO TABS: 500.0000 mg | ORAL_TABLET | Freq: Every day | ORAL | 0 refills | Status: DC

## 2018-12-14 MED ORDER — LEVOFLOXACIN 500 MG PO TABS
500.0000 mg | ORAL_TABLET | Freq: Every day | ORAL | Status: DC
  Administered 2018-12-14: 500 mg via ORAL
  Filled 2018-12-14: qty 1

## 2018-12-14 MED ORDER — METRONIDAZOLE 500 MG PO TABS
500.0000 mg | ORAL_TABLET | Freq: Three times a day (TID) | ORAL | Status: DC
  Administered 2018-12-14 (×2): 500 mg via ORAL
  Filled 2018-12-14 (×2): qty 1

## 2018-12-14 MED ORDER — METRONIDAZOLE 500 MG PO TABS: 500.0000 mg | ORAL_TABLET | Freq: Three times a day (TID) | ORAL | 0 refills | Status: AC

## 2018-12-14 MED ORDER — POLYETHYLENE GLYCOL 3350 17 G PO PACK: 17.0000 g | PACK | Freq: Every day | ORAL | Status: DC | PRN

## 2018-12-14 MED ORDER — ONDANSETRON 8 MG PO TBDP: 8.0000 mg | ORAL_TABLET | Freq: Three times a day (TID) | ORAL | 0 refills | Status: DC | PRN

## 2018-12-14 MED ORDER — METRONIDAZOLE 500 MG PO TABS: 500.0000 mg | ORAL_TABLET | Freq: Three times a day (TID) | ORAL | 0 refills | Status: DC

## 2018-12-14 NOTE — Progress Notes (Signed)
Nicholas Lowery   DOB:November 06, 1960   GM#:[id number]   UYQ#:[id number]  Oncology follow up   Subjective: Nicholas Lowery after better last night, still has mild nausea, no vomiting, he has not had bowel movement since yesterday.  Afebrile, he did not eat breakfast, but ready to order and try lunch.  Objective:  Vitals:   12/14/18 0818 12/14/18 0819  BP:    Pulse:    Resp:    Temp:    SpO2: 99% 99%    Body mass index is 25.97 kg/m.  Intake/Output Summary (Last 24 hours) at 12/14/2018 1150 Last data filed at 12/14/2018 1003 Gross per 24 hour  Intake 2385.59 ml  Output 250 ml  Net 2135.59 ml     Sclerae unicteric  Oropharynx clear  No peripheral adenopathy  Lungs clear -- no rales or rhonchi  Heart regular rate and rhythm  Abdomen soft, mild diffuse tenderness   MSK no focal spinal tenderness, no peripheral edema  Neuro nonfocal    CBG (last 3)  No results for input(s): GLUCAP in the last 72 hours.   Labs:  CBC Latest Ref Rng & Units 12/14/2018 12/13/2018 12/12/2018  WBC 4.0 - 10.5 K/uL 4.0 5.2 7.5  Hemoglobin 13.0 - 17.0 g/dL 8.3(L) 9.6(L) 11.0(L)  Hematocrit 39.0 - 52.0 % 28.3(L) 31.5(L) 34.9(L)  Platelets 150 - 400 K/uL 218 241 304     Urine Studies No results for input(s): UHGB, CRYS in the last 72 hours.  Invalid input(s): UACOL, UAPR, USPG, UPH, UTP, UGL, UKET, UBIL, UNIT, UROB, ULEU, UEPI, UWBC, URBC, UBAC, CAST, [place], [place]  Basic Metabolic Panel: Recent Labs  Lab 12/11/18 1918 12/12/18 0242 12/12/18 0535 12/13/18 1020 12/14/18 0402  NA 137  --  136 137 136  K 4.1  --  4.3 3.6 3.9  CL 102  --  102 110 106  CO2 21*  --  25 22 25   GLUCOSE 141*  --  108* 135* 95  BUN 17  --  18 19 18   CREATININE 1.32* 1.27* 1.33* 1.28* 1.21  CALCIUM 9.0  --  8.1* 7.5* 8.0*   GFR Estimated Creatinine Clearance: 71.2 mL/min (by C-G formula based on SCr of 1.21 mg/dL). Liver Function Tests: Recent Labs  Lab 12/11/18 1918 12/12/18 0535  AST 22 17  ALT 15 13  ALKPHOS 72 61   BILITOT 0.9 0.6  PROT 6.7 5.5*  ALBUMIN 3.4* 2.7*   Recent Labs  Lab 12/11/18 1918  LIPASE 23   No results for input(s): AMMONIA in the last 168 hours. Coagulation profile No results for input(s): INR, PROTIME in the last 168 hours.  CBC: Recent Labs  Lab 12/11/18 1918 12/12/18 0242 12/12/18 0535 12/13/18 1020 12/14/18 0402  WBC 13.2* 7.9 7.5 5.2 4.0  HGB 12.9* 11.2* 11.0* 9.6* 8.3*  HCT 41.9 35.8* 34.9* 31.5* 28.3*  MCV 89.3 90.6 91.1 91.8 93.1  PLT 494* 326 304 241 218   Cardiac Enzymes: No results for input(s): CKTOTAL, CKMB, CKMBINDEX, TROPONINI in the last 168 hours. BNP: Invalid input(s): POCBNP CBG: No results for input(s): GLUCAP in the last 168 hours. D-Dimer No results for input(s): DDIMER in the last 72 hours. Hgb A1c No results for input(s): HGBA1C in the last 72 hours. Lipid Profile No results for input(s): CHOL, HDL, LDLCALC, TRIG, CHOLHDL, LDLDIRECT in the last 72 hours. Thyroid function studies No results for input(s): TSH, T4TOTAL, T3FREE, THYROIDAB in the last 72 hours.  Invalid input(s): FREET3 Anemia work up No results for input(s): VITAMINB12, FOLATE,  FERRITIN, TIBC, IRON, RETICCTPCT in the last 72 hours. Microbiology Recent Results (from the past 240 hour(s))  SARS Coronavirus 2 (Performed in [place] hospital lab)     Status: None   Collection Time: 12/07/18  2:33 PM   Specimen: Nasal Swab  Result Value Ref Range Status   SARS Coronavirus 2 NEGATIVE NEGATIVE Final    Comment: (NOTE) SARS-CoV-2 target nucleic acids are NOT DETECTED. The SARS-CoV-2 RNA is generally detectable in upper and lower respiratory specimens during the acute phase of infection. Negative results do not preclude SARS-CoV-2 infection, do not rule out co-infections with other pathogens, and should not be used as the sole basis for treatment or other patient management decisions. Negative results must be combined with clinical observations, patient history, and  epidemiological information. The expected result is Negative. Fact Sheet for Patients: SugarRoll.be Fact Sheet for Healthcare Providers: https://www.woods-mathews.com/ This test is not yet approved or cleared by the [place] FDA and  has been authorized for detection and/or diagnosis of SARS-CoV-2 by FDA under an Emergency Use Authorization (EUA). This EUA will remain  in effect (meaning this test can be used) for the duration of the COVID-19 declaration under Section 56 4(b)(1) of the Act, 21 U.S.C. section 360bbb-3(b)(1), unless the authorization is terminated or revoked sooner. Performed at [facility], [place] [address], [place], [place] [address]   SARS Coronavirus 2 (CEPHEID - Performed in [place] hospital lab), Hosp Order     Status: None   Collection Time: 12/11/18 11:09 PM   Specimen: Nasopharyngeal Swab  Result Value Ref Range Status   SARS Coronavirus 2 NEGATIVE NEGATIVE Final    Comment: (NOTE) If result is NEGATIVE SARS-CoV-2 target nucleic acids are NOT DETECTED. The SARS-CoV-2 RNA is generally detectable in upper and lower  respiratory specimens during the acute phase of infection. The lowest  concentration of SARS-CoV-2 viral copies this assay can detect is 250  copies / mL. A negative result does not preclude SARS-CoV-2 infection  and should not be used as the sole basis for treatment or other  patient management decisions.  A negative result may occur with  improper specimen collection / handling, submission of specimen other  than nasopharyngeal swab, presence of viral mutation(s) within the  areas targeted by this assay, and inadequate number of viral copies  (<250 copies / mL). A negative result must be combined with clinical  observations, patient history, and epidemiological information. If result is POSITIVE SARS-CoV-2 target nucleic acids are DETECTED. The SARS-CoV-2 RNA is generally detectable in  upper and lower  respiratory specimens dur ing the acute phase of infection.  Positive  results are indicative of active infection with SARS-CoV-2.  Clinical  correlation with patient history and other diagnostic information is  necessary to determine patient infection status.  Positive results do  not rule out bacterial infection or co-infection with other viruses. If result is PRESUMPTIVE POSTIVE SARS-CoV-2 nucleic acids MAY BE PRESENT.   A presumptive positive result was obtained on the submitted specimen  and confirmed on repeat testing.  While 2019 novel coronavirus  (SARS-CoV-2) nucleic acids may be present in the submitted sample  additional confirmatory testing may be necessary for epidemiological  and / or clinical management purposes  to differentiate between  SARS-CoV-2 and other Sarbecovirus currently known to infect humans.  If clinically indicated additional testing with an alternate test  methodology [phone]) is advised. The SARS-CoV-2 RNA is generally  detectable in upper and lower respiratory sp ecimens  during the acute  phase of infection. The expected result is Negative. Fact Sheet for Patients:  StrictlyIdeas.no Fact Sheet for Healthcare Providers: BankingDealers.co.za This test is not yet approved or cleared by the [place] FDA and has been authorized for detection and/or diagnosis of SARS-CoV-2 by FDA under an Emergency Use Authorization (EUA).  This EUA will remain in effect (meaning this test can be used) for the duration of the COVID-19 declaration under Section 564(b)(1) of the Act, 21 U.S.C. section 360bbb-3(b)(1), unless the authorization is terminated or revoked sooner. Performed at [facility], [place] [address], [place], [place] [address]   Culture, body fluid-bottle     Status: None (Preliminary result)   Collection Time: 12/13/18  4:34 PM   Specimen: Fluid  Result Value Ref Range  Status   Specimen Description FLUID PERITONEAL  Final   Special Requests NONE  Final   Culture   Final    NO GROWTH < 12 HOURS Performed at [facility], [place] [address], [place], [place] [address]    Report Status PENDING  Incomplete  Gram stain     Status: None   Collection Time: 12/13/18  4:34 PM   Specimen: Fluid  Result Value Ref Range Status   Specimen Description FLUID PERITONEAL  Final   Special Requests NONE  Final   Gram Stain   Final    WBC PRESENT,BOTH PMN AND MONONUCLEAR NO ORGANISMS SEEN CYTOSPIN SMEAR Performed at [facility], [address], [place], [place] [address]    Report Status 12/14/2018 FINAL  Final  SARS Coronavirus 2 [facility] order, Performed in [facility] hospital lab) Nasopharyngeal Nasopharyngeal Swab     Status: None   Collection Time: 12/13/18  5:14 PM   Specimen: Nasopharyngeal Swab  Result Value Ref Range Status   SARS Coronavirus 2 NEGATIVE NEGATIVE Final    Comment: (NOTE) If result is NEGATIVE SARS-CoV-2 target nucleic acids are NOT DETECTED. The SARS-CoV-2 RNA is generally detectable in upper and lower  respiratory specimens during the acute phase of infection. The lowest  concentration of SARS-CoV-2 viral copies this assay can detect is 250  copies / mL. A negative result does not preclude SARS-CoV-2 infection  and should not be used as the sole basis for treatment or other  patient management decisions.  A negative result may occur with  improper specimen collection / handling, submission of specimen other  than nasopharyngeal swab, presence of viral mutation(s) within the  areas targeted by this assay, and inadequate number of viral copies  (<250 copies / mL). A negative result must be combined with clinical  observations, patient history, and epidemiological information. If result is POSITIVE SARS-CoV-2 target nucleic acids are DETECTED. The SARS-CoV-2 RNA is generally detectable in upper and lower  respiratory  specimens dur ing the acute phase of infection.  Positive  results are indicative of active infection with SARS-CoV-2.  Clinical  correlation with patient history and other diagnostic information is  necessary to determine patient infection status.  Positive results do  not rule out bacterial infection or co-infection with other viruses. If result is PRESUMPTIVE POSTIVE SARS-CoV-2 nucleic acids MAY BE PRESENT.   A presumptive positive result was obtained on the submitted specimen  and confirmed on repeat testing.  While 2019 novel coronavirus  (SARS-CoV-2) nucleic acids may be present in the submitted sample  additional confirmatory testing may be necessary for epidemiological  and / or clinical management purposes  to differentiate between  SARS-CoV-2 and other Sarbecovirus currently known to infect humans.  If clinically indicated additional testing with an alternate test  methodology [phone]) is advised. The SARS-CoV-2 RNA is generally  detectable in upper and lower respiratory sp ecimens during the acute  phase of infection. The expected result is Negative. Fact Sheet for Patients:  StrictlyIdeas.no Fact Sheet for Healthcare Providers: BankingDealers.co.za This test is not yet approved or cleared by the [place] FDA and has been authorized for detection and/or diagnosis of SARS-CoV-2 by FDA under an Emergency Use Authorization (EUA).  This EUA will remain in effect (meaning this test can be used) for the duration of the COVID-19 declaration under Section 564(b)(1) of the Act, 21 U.S.C. section 360bbb-3(b)(1), unless the authorization is terminated or revoked sooner. Performed at [facility], [place] [address], [place], [place] [address]       Studies:  US Paracentesis  Result Date: 12/13/2018 INDICATION: Patient with history metastatic colon cancer, enteritis, right pleural effusion, ascites. Request made  for diagnostic and therapeutic paracentesis. EXAM: ULTRASOUND GUIDED DIAGNOSTIC AND THERAPEUTIC PARACENTESIS MEDICATIONS: None COMPLICATIONS: None immediate. PROCEDURE: Informed written consent was obtained from the patient after a discussion of the risks, benefits and alternatives to treatment. A timeout was performed prior to the initiation of the procedure. Initial ultrasound scanning demonstrates a small to moderate amount of ascites within the left mid lower abdominal quadrant. The left mid to lower abdomen was prepped and draped in the usual sterile fashion. 1% lidocaine was used for local anesthesia. Following this, a 19 gauge, 7-cm, Yueh catheter was introduced. An ultrasound image was saved for documentation purposes. The paracentesis was performed. The catheter was removed and a dressing was applied. The patient tolerated the procedure well without immediate post procedural complication. FINDINGS: A total of approximately 1.7 liters of yellow fluid was removed. Samples were sent to the laboratory as requested by the clinical team. IMPRESSION: Successful ultrasound-guided diagnostic and therapeutic paracentesis yielding 1.7 liters of peritoneal fluid. Read by: Rowe Robert, PA-C Electronically Signed   By: Markus Daft M.D.   On: 12/13/2018 17:19    Assessment: 58 y.o. Male with stage IV colon cancer, recently progressed, anxiety, depression, HTN, pleural effusion, presented with worsening abdominal pain, nausea, and diarrhea for 1-2 days   1. Acute enteritis, infectious vs laxative related  2.  Metastatic colon cancer to liver, peritoneum, pleura, lung, nodes  3.  Abdominal pain secondary to cancer, on chronic narcotics 4.  Hypertension 5. Right pleural effusion and recent pneumonia, ascites  6. Deconditioning  7. Worsening anemia   Plan:  -he is overall improving, will let him try to eat lunch to see if he tolerates well -will get him started on colace first, may need to star laxative again  for constipation since he is on high dose narcotics  -He is on oral antibiotics now, I think a short course Levaquin is probably adequate, clinically no high suspicion for c-diff colitis  -lab reviewed and discussed with him, his anemia is worsening, possible related to hydration and dilution, no clinical bleeding. It happened when he was hospitalized last time also. Will hold blood transfusion for now, and I will f/u after discharge  -OK to discharge home today or tomorrow if he is clinically stable    Truitt Merle, MD 12/14/2018  11:50 AM

## 2018-12-14 NOTE — Discharge Summary (Signed)
Physician Discharge Summary   Patient ID: Nicholas Lowery MRN: [id number] DOB/AGE: Oct 05, 1960 58 y.o.  Admit date: 12/11/2018 Discharge date: 12/14/2018  Primary Care Physician:  Curlene Labrum, MD   Recommendations for Outpatient Follow-up:  1. Follow up with PCP in 1-2 weeks  Home Health: None  Equipment/Devices:   Discharge Condition: stable  CODE STATUS: FULL Diet recommendation: Heart healthy diet   Discharge Diagnoses:    . Enteritis of infectious origin . Diarrhea . Metastatic colon cancer to liver ([place]) . HTN (hypertension) . Ascites s/p paracentesis  . Right Pleural effusion S/P THORACENTESIS     Consults:  Oncology     Allergies:   Allergies  Allergen Reactions  . Penicillins Other (See Comments)    UNSPECIFIED REACTION   Has patient had a PCN reaction causing immediate rash, facial/tongue/throat swelling, SOB or lightheadedness with hypotension: unknown Has patient had a PCN reaction causing severe rash involving mucus membranes or skin necrosis: unknown Has patient had a PCN reaction that required hospitalization unknown Has patient had a PCN reaction occurring within the last 10 years: No If all of the above answers are "NO", then may proceed with Cephalosporin use.   [name] [Capecitabine] Nausea And Vomiting and Other (See Comments)    Foot pain, patient couldn't walk, and bleeding gums  . Nivolumab Nausea And Vomiting, Palpitations and Other (See Comments)    Infusion reaction: Chills, rigors, numbness. Symptoms improved with ondansetron, clonidine, famotidine, meperidine     DISCHARGE MEDICATIONS: Allergies as of 12/14/2018      Reactions   Penicillins Other (See Comments)   UNSPECIFIED REACTION  Has patient had a PCN reaction causing immediate rash, facial/tongue/throat swelling, SOB or lightheadedness with hypotension: unknown Has patient had a PCN reaction causing severe rash involving mucus membranes or skin necrosis: unknown Has  patient had a PCN reaction that required hospitalization unknown Has patient had a PCN reaction occurring within the last 10 years: No If all of the above answers are "NO", then may proceed with Cephalosporin use.   Xeloda [capecitabine] Nausea And Vomiting, Other (See Comments)   Foot pain, patient couldn't walk, and bleeding gums   Nivolumab Nausea And Vomiting, Palpitations, Other (See Comments)   Infusion reaction: Chills, rigors, numbness. Symptoms improved with ondansetron, clonidine, famotidine, meperidine      Medication List    STOP taking these medications   losartan 100 MG tablet Commonly known as: COZAAR     TAKE these medications   albuterol 108 (90 Base) MCG/ACT inhaler Commonly known as: VENTOLIN HFA Inhale 1-2 puffs into the lungs every 6 (six) hours as needed for wheezing or shortness of breath. What changed: when to take this   amLODipine 10 MG tablet Commonly known as: NORVASC Take 1 tablet (10 mg total) by mouth daily.   cyclobenzaprine 5 MG tablet Commonly known as: FLEXERIL Take 1 tablet (5 mg total) by mouth 3 (three) times daily as needed for muscle spasms.   fentaNYL 50 MCG/HR Commonly known as: [place] 1 patch onto the skin every other day.   fentaNYL 25 MCG/HR Commonly known as: Ovid 1 patch onto the skin every other day. In addition to 60mg/hr patch every 48 hours   Flovent HFA 110 MCG/ACT inhaler Generic drug: fluticasone Inhale 2 puffs into the lungs 2 (two) times daily. What changed: when to take this   furosemide 20 MG tablet Commonly known as: LASIX Take 0.5 tablets (10 mg total) by mouth daily.   gabapentin 300  MG capsule Commonly known as: NEURONTIN TAKE ONE CAPSULE BY MOUTH THREE TIMES DAILY What changed:   when to take this  reasons to take this   levofloxacin 500 MG tablet Commonly known as: LEVAQUIN Take 1 tablet (500 mg total) by mouth daily for 5 days.   LORazepam 1 MG tablet Commonly known as:  ATIVAN Take 1 tablet (1 mg total) by mouth every 4 (four) hours as needed for anxiety or sleep (nausea). What changed: when to take this   metroNIDAZOLE 500 MG tablet Commonly known as: FLAGYL Take 1 tablet (500 mg total) by mouth 3 (three) times daily for 5 days.   multivitamin with minerals Tabs tablet Take 1 tablet by mouth daily.   naloxone 4 MG/0.1ML Liqd nasal spray kit Commonly known as: NARCAN Place 1 spray into the nose once as needed (emergency).   ondansetron 8 MG disintegrating tablet Commonly known as: Zofran ODT Take 1 tablet (8 mg total) by mouth every 8 (eight) hours as needed for nausea or vomiting.   Oxycodone HCl 10 MG Tabs Take 1-2 tablets (10-20 mg total) by mouth every 6 (six) hours as needed. What changed:   how much to take  when to take this   polyethylene glycol 17 g packet Commonly known as: MIRALAX / GLYCOLAX Take 17 g by mouth daily as needed for mild constipation.   prochlorperazine 10 MG tablet Commonly known as: COMPAZINE Take 1 tablet (10 mg total) by mouth every 6 (six) hours as needed for nausea or vomiting.   senna-docusate 8.6-50 MG tablet Commonly known as: Senokot-S Take 2 tablets by mouth 2 (two) times daily. What changed:   when to take this  reasons to take this   trifluridine-tipiracil 20-8.19 MG tablet Commonly known as: LONSURF Take 4 tabs (7m trifluridine) PO in AM & 3 tabs (650mtrifluridine) in PM, immediately after food on days 1-5 & 8-12 of each 28d cycle   venlafaxine XR 150 MG 24 hr capsule Commonly known as: EFFEXOR-XR TAKE ONE CAPSULE BY MOUTH DAILY WITH BREAKFAST What changed: See the new instructions.   zolpidem 12.5 MG CR tablet Commonly known as: Ambien CR Take 1 tablet (12.5 mg total) by mouth at bedtime.        Brief H and P: For complete details please refer to admission H and P, but in brief Patient is a [age]ear old male with colon cancer with metastasis to the liver, hypertension, anxiety  disorder, recurrent diarrhea as well as chronic pain from cancer who presented to the ER  with diarrhea and generalized weakness. Patient has been on high-dose narcotics and apparently been constipated for a while. He has been taking laxatives lately to relieve his constipation. He however started having diarrhea a day before the admission which has not stopped. He has not taken any laxatives. Patient also has weakness and not even adequately. He was seen in the ER tachypneic with CT scan showing possible right sided colitis. He was having 7 out of 10 abdominal pain localized to the area. Patient is therefore being admitted for colitis questionable infectious versus ischemic. He is afebrile.  COVID-19 test negative  Hospital Course:  Acute enteritis of infectious origin -Possibly infectious.  CT scan showed possible right-sided colitis -Patient also reported being on antibiotics for sepsis and pneumonia when he was admitted 7/12 to 7/15 and was discharged home on levofloxacin -Allergic to penicillins,  patient was placed on Levaquin and Flagyl.  No further diarrhea during hospitalization and he had rapid  improvement, hence possible his diarrhea was due to multiple laxatives at home. --COVID-19 test negative -Patient is now tolerating diet, transition to oral Levaquin and Flagyl for another 5 days to complete full course     Metastatic colon cancer to liver ([facility]) -Continue outpatient follow-up with oncology -Patient was followed by his oncologist during hospitalization, highly appreciate Dr. Feng's recommendations    HTN (hypertension) -BP now stable, continue amlodipine and low-dose Lasix, hold losartan  Right pleural effusion, ascites - Chronic, had recent thoracentesis x2 -  CT abdomen pelvis on 7/29 showed ill-defined mesenteric soft tissue mass near surgical anastomosis with encasement of the distal SMA and SMV vessels.  Moderate to large volume of ascites, moderate right  pleural effusion. - Paracentesis were deferred on 7/20, underwent right thoracentesis on 7/13 yielding 1.3 L fluid, ultrasound-guided right thoracentesis again on 7/27 yielding 2.1 L fluid -Underwent ultrasound-guided paracentesis on 7/31, 1.7 L removed.  Cultures negative. - underwent right sided thoracentesis. 1.4 L removed   Day of Discharge S: thoracentesis done. No diarrhea, tolerating diet.   BP (!) 120/93 (BP Location: Right Arm)   Pulse 92   Temp 97.7 F (36.5 C) (Oral)   Resp 16   Ht 5' 10" (1.778 m)   Wt 82.1 kg   SpO2 99%   BMI 25.97 kg/m   Physical Exam: General: Alert and awake oriented x3 not in any acute distress. HEENT: anicteric sclera, pupils reactive to light and accommodation CVS: S1-S2 clear no murmur rubs or gallops Chest: Decreased breath sound at the bases Abdomen: soft nontender,  normal bowel sounds Extremities: no cyanosis, clubbing or edema noted bilaterally Neuro: Cranial nerves II-XII intact, no focal neurological deficits   The results of significant diagnostics from this hospitalization (including imaging, microbiology, ancillary and laboratory) are listed below for reference.      Procedures/Studies:  Dg Chest 1 View  Result Date: 12/09/2018 CLINICAL DATA:  Post rt lung thora, in IR, pt waiting, feels better, no quite so sob mk rt-r EXAM: CHEST  1 VIEW COMPARISON:  12/05/2018 FINDINGS: No pneumothorax. Small residual right pleural effusion. Improved aeration at the right lung base with some residual interstitial and airspace opacities medially. Left lung clear. Heart size and mediastinal contours are within normal limits. Left subclavian port catheter to the distal SVC as before. Visualized bones unremarkable. IMPRESSION: No pneumothorax post right thoracentesis. Electronically Signed   By: D  Hassell M.D.   On: 12/09/2018 15:31   Dg Chest 1 View  Result Date: 12/05/2018 CLINICAL DATA:  Evaluate right pleural effusion. Metastatic colon  carcinoma. EXAM: CHEST  1 VIEW COMPARISON:  11/25/2018 FINDINGS: By pleural effusion has increased, moderate size, now obscuring the right hemidiaphragm. There is opacity at the right lung base consistent with atelectasis. Remainder of the lungs is clear. No left pleural effusion. Cardiac silhouette is normal in size. No mediastinal or hilar masses. Left anterior chest wall Port-A-Cath is stable. Skeletal structures are grossly intact. IMPRESSION: 1. Increased size of the right pleural effusion, now moderate and obscuring the right hemidiaphragm. There is associated right lung base opacity most likely atelectasis. 2. Remainder of the lungs is clear.  No left pleural effusion. Electronically Signed   By: David  Ormond M.D.   On: 12/05/2018 14:40   Dg Chest 1 View  Result Date: 11/25/2018 CLINICAL DATA:  Status post RIGHT thoracentesis. EXAM: CHEST  1 VIEW COMPARISON:  None. FINDINGS: The RIGHT pleural effusion has decreased in size. Mild RIGHT basilar atelectasis persists. There is   no evidence of pneumothorax. A LEFT Port-A-Cath is again noted. The LEFT lung is clear. IMPRESSION: Decreased RIGHT pleural effusion with mild persistent RIGHT basilar atelectasis. No pneumothorax. Electronically Signed   By: Margarette Canada M.D.   On: 11/25/2018 14:07   Ct Angio Chest Pe W Or Wo Contrast  Result Date: 11/24/2018 CLINICAL DATA:  History of metastatic colon carcinoma with fever and abdominal pain EXAM: CT ANGIOGRAPHY CHEST CT ABDOMEN AND PELVIS WITH CONTRAST TECHNIQUE: Multidetector CT imaging of the chest was performed using the standard protocol during bolus administration of intravenous contrast. Multiplanar CT image reconstructions and MIPs were obtained to evaluate the vascular anatomy. Multidetector CT imaging of the abdomen and pelvis was performed using the standard protocol during bolus administration of intravenous contrast. CONTRAST:  76m OMNIPAQUE IOHEXOL 350 MG/ML SOLN COMPARISON:  Similar exam dated  11/13/2018 FINDINGS: CTA CHEST FINDINGS Cardiovascular: Thoracic aorta demonstrates a normal branching pattern. No aneurysmal dilatation or dissection is seen. The heart is normal in size and configuration. The pulmonary artery shows a normal branching pattern. No definitive filling defect to suggest pulmonary embolism is noted. Mediastinum/Nodes: Thoracic inlet is within normal limits. The esophagus is within normal limits. Scattered mediastinal lymph nodes are again identified similar to that seen on the recent restaging examination from 11 days previous. Stable right hilar lymph node is noted as well. Lungs/Pleura: The lungs are well aerated bilaterally. Nodules are again identified bilaterally. The overall appearance is stable from the prior exam from 11 days previous. An enlarging right-sided pleural effusion is seen. New superimposed right lower lobe and right middle lobe infiltrate is seen. Musculoskeletal: Degenerative changes of the thoracic spine are noted. No metastatic lesions are seen. Left-sided chest wall port is noted Review of the MIP images confirms the above findings. CT ABDOMEN and PELVIS FINDINGS Hepatobiliary: Liver again demonstrates a hypodense lesion within the right lobe consistent with metastatic disease measuring 4.2 cm in greatest dimension. Small hypodensity in the dome of the liver is noted also stable from the prior study. Gallbladder demonstrates vicarious excretion of contrast. No ductal dilatation is seen. Pancreas: Unremarkable. No pancreatic ductal dilatation or surrounding inflammatory changes. Spleen: Normal in size without focal abnormality. Adrenals/Urinary Tract: Adrenal glands are within normal limits. Kidneys demonstrate no renal calculi or obstructive changes bilaterally. Normal excretion of contrast is seen. The bladder is decompressed. Stomach/Bowel: Colon is well visualized. Postsurgical changes are noted in the mid transverse colon consistent with the prior surgical  history. There is again noted soft tissue mass in the mesentery adjacent to the anastomotic site best seen in the coronal plane on image number 40 of series 4. Just proximal to the small bowel colon anastomotic site there is some mild dilatation of the distal small bowel identified. Multiple edematous loops of small bowel are noted proximal to the anastomotic site. A mild partial small bowel obstruction at the anastomotic site cannot be totally excluded. Vascular/Lymphatic: No aneurysmal dilatation is seen. The encasement of the superior mesenteric artery and vein by the adjacent mesenteric mass is less well visualized on today's exam due to the timing of the contrast bolus. There does remain however venous engorgement of the small bowel contributing to the degree of small bowel wall edema and likely ascites. Reproductive: Prostate is unremarkable. Other: Increase in ascites is noted both in the pelvis as well as surrounding the bowel loops and liver. Peritoneal implant is again noted near the umbilicus. Musculoskeletal: Degenerative changes of lumbar spine are seen. Review of the MIP images  confirms the above findings. IMPRESSION: CTA of the chest: No evidence of pulmonary emboli are identified. Stable right hilar and mediastinal adenopathy is seen. Stable metastatic lesions within both lungs. Increasing right-sided pleural effusion with associated right lower and middle lobe infiltrates CT of the abdomen and pelvis: Postsurgical changes consistent with the given clinical history. There remains a mesenteric mass near the anastomotic site similar to that seen on the recent exam. Stable appearing hepatic metastatic disease is noted. Persistent venous engorgement is noted related to vascular encasement with new small bowel edematous changes identified. Some mild dilatation of the distal small bowel proximal to the anastomosis is seen. The possibility of mild narrowing at the anastomosis deserves consideration. This  was not present on the most recent exam. This may be accentuated by the venous engorgement and small-bowel wall edema. Increasing ascites. There are changes consistent with peritoneal implantation stable from the prior exam. Electronically Signed   By: Inez Catalina M.D.   On: 11/24/2018 18:13   Ct Abdomen Pelvis W Contrast  Result Date: 12/11/2018 CLINICAL DATA:  Diarrhea EXAM: CT ABDOMEN AND PELVIS WITH CONTRAST TECHNIQUE: Multidetector CT imaging of the abdomen and pelvis was performed using the standard protocol following bolus administration of intravenous contrast. CONTRAST:  115m OMNIPAQUE IOHEXOL 300 MG/ML  SOLN COMPARISON:  CT 11/24/2018, 08/29/2018 FINDINGS: Lower chest: Lung bases demonstrate moderate right-sided pleural effusion. Innumerable metastatic pulmonary nodules are again visualized. Peribronchial thickening at the right middle lobe and right lung base. Partial atelectasis or pneumonia at the right base. Hepatobiliary: Multiple hypodense liver nodules consistent with metastatic disease. Index lesion in the inferior right hepatic lobe measures 5.5 cm, compared with 4.3 cm previously. Increased conspicuity of other metastatic nodules in the liver. No calcified gallstone or biliary dilatation Pancreas: Unremarkable. No pancreatic ductal dilatation or surrounding inflammatory changes. Spleen: Normal in size without focal abnormality. Adrenals/Urinary Tract: Adrenal glands are normal. Kidneys show no hydronephrosis. The bladder is unremarkable Stomach/Bowel: The stomach is nonenlarged. Abnormal segmental small bowel thickening in the right hemiabdomen. No intramural air. Status post partial colectomy Vascular/Lymphatic: Nonaneurysmal aorta. Stable subcentimeter retroperitoneal nodes. Increased intraperitoneal nodules near the umbilical region, measuring up to 14 mm. Reproductive: Slightly enlarged prostate with calcification Other: No free air. Moderate to large volume of ascites in the abdomen  and pelvis, increased compared to prior. Ill-defined soft tissue density within the central mesentery adjacent to surgical anastomosis. This appears to encase the distal SMA and SMV. Musculoskeletal: No acute or suspicious osseous abnormality. IMPRESSION: 1. Segmental small bowel wall thickening within the right hemiabdomen which may be secondary to infection or ischemia, less likely inflammatory bowel disease. Bowel wall thickening appears worse when compared to the most recent CT from November 24, 2018. The segment of thickened small bowel is also fluid filled and there may be a degree of obstruction, possibly related to surgical anastomosis. 2. Moderate right pleural effusion. Numerous metastatic pulmonary nodules as before. 3. Increased size of inferior right hepatic lobe metastatic lesion with increased consult conspicuity of additional hypodense liver lesions, consistent with progression of metastatic disease. 4. Ill-defined mesenteric soft tissue mass near the surgical anastomosis, with encasement of the distal SMA and SMV vessels 5. Moderate to large volume of ascites within the abdomen and pelvis, may be slightly increased. Increased peritoneal nodularity near the umbilicus Electronically Signed   By: KDonavan FoilM.D.   On: 12/11/2018 22:25   Ct Abdomen Pelvis W Contrast  Result Date: 11/24/2018 CLINICAL DATA:  History of metastatic colon  carcinoma with fever and abdominal pain EXAM: CT ANGIOGRAPHY CHEST CT ABDOMEN AND PELVIS WITH CONTRAST TECHNIQUE: Multidetector CT imaging of the chest was performed using the standard protocol during bolus administration of intravenous contrast. Multiplanar CT image reconstructions and MIPs were obtained to evaluate the vascular anatomy. Multidetector CT imaging of the abdomen and pelvis was performed using the standard protocol during bolus administration of intravenous contrast. CONTRAST:  80mL OMNIPAQUE IOHEXOL 350 MG/ML SOLN COMPARISON:  Similar exam dated  11/13/2018 FINDINGS: CTA CHEST FINDINGS Cardiovascular: Thoracic aorta demonstrates a normal branching pattern. No aneurysmal dilatation or dissection is seen. The heart is normal in size and configuration. The pulmonary artery shows a normal branching pattern. No definitive filling defect to suggest pulmonary embolism is noted. Mediastinum/Nodes: Thoracic inlet is within normal limits. The esophagus is within normal limits. Scattered mediastinal lymph nodes are again identified similar to that seen on the recent restaging examination from 11 days previous. Stable right hilar lymph node is noted as well. Lungs/Pleura: The lungs are well aerated bilaterally. Nodules are again identified bilaterally. The overall appearance is stable from the prior exam from 11 days previous. An enlarging right-sided pleural effusion is seen. New superimposed right lower lobe and right middle lobe infiltrate is seen. Musculoskeletal: Degenerative changes of the thoracic spine are noted. No metastatic lesions are seen. Left-sided chest wall port is noted Review of the MIP images confirms the above findings. CT ABDOMEN and PELVIS FINDINGS Hepatobiliary: Liver again demonstrates a hypodense lesion within the right lobe consistent with metastatic disease measuring 4.2 cm in greatest dimension. Small hypodensity in the dome of the liver is noted also stable from the prior study. Gallbladder demonstrates vicarious excretion of contrast. No ductal dilatation is seen. Pancreas: Unremarkable. No pancreatic ductal dilatation or surrounding inflammatory changes. Spleen: Normal in size without focal abnormality. Adrenals/Urinary Tract: Adrenal glands are within normal limits. Kidneys demonstrate no renal calculi or obstructive changes bilaterally. Normal excretion of contrast is seen. The bladder is decompressed. Stomach/Bowel: Colon is well visualized. Postsurgical changes are noted in the mid transverse colon consistent with the prior surgical  history. There is again noted soft tissue mass in the mesentery adjacent to the anastomotic site best seen in the coronal plane on image number 40 of series 4. Just proximal to the small bowel colon anastomotic site there is some mild dilatation of the distal small bowel identified. Multiple edematous loops of small bowel are noted proximal to the anastomotic site. A mild partial small bowel obstruction at the anastomotic site cannot be totally excluded. Vascular/Lymphatic: No aneurysmal dilatation is seen. The encasement of the superior mesenteric artery and vein by the adjacent mesenteric mass is less well visualized on today's exam due to the timing of the contrast bolus. There does remain however venous engorgement of the small bowel contributing to the degree of small bowel wall edema and likely ascites. Reproductive: Prostate is unremarkable. Other: Increase in ascites is noted both in the pelvis as well as surrounding the bowel loops and liver. Peritoneal implant is again noted near the umbilicus. Musculoskeletal: Degenerative changes of lumbar spine are seen. Review of the MIP images confirms the above findings. IMPRESSION: CTA of the chest: No evidence of pulmonary emboli are identified. Stable right hilar and mediastinal adenopathy is seen. Stable metastatic lesions within both lungs. Increasing right-sided pleural effusion with associated right lower and middle lobe infiltrates CT of the abdomen and pelvis: Postsurgical changes consistent with the given clinical history. There remains a mesenteric mass near the   anastomotic site similar to that seen on the recent exam. Stable appearing hepatic metastatic disease is noted. Persistent venous engorgement is noted related to vascular encasement with new small bowel edematous changes identified. Some mild dilatation of the distal small bowel proximal to the anastomosis is seen. The possibility of mild narrowing at the anastomosis deserves consideration. This  was not present on the most recent exam. This may be accentuated by the venous engorgement and small-bowel wall edema. Increasing ascites. There are changes consistent with peritoneal implantation stable from the prior exam. Electronically Signed   By: Mark  Lukens M.D.   On: 11/24/2018 18:13   Us Abdomen Limited  Result Date: 12/02/2018 CLINICAL DATA:  Metastatic colon cancer to the liver. Follow-up ascites. EXAM: LIMITED ABDOMEN ULTRASOUND FOR ASCITES TECHNIQUE: Limited ultrasound survey for ascites was performed in all four abdominal quadrants. COMPARISON:  Abdomen and pelvis CT dated 11/24/2018. FINDINGS: Moderate amount of free peritoneal fluid, increased since 11/24/2018. IMPRESSION: Moderate amount of ascites, increased. Electronically Signed   By: Steven  Reid M.D.   On: 12/02/2018 09:18   Us Paracentesis  Result Date: 12/13/2018 INDICATION: Patient with history metastatic colon cancer, enteritis, right pleural effusion, ascites. Request made for diagnostic and therapeutic paracentesis. EXAM: ULTRASOUND GUIDED DIAGNOSTIC AND THERAPEUTIC PARACENTESIS MEDICATIONS: None COMPLICATIONS: None immediate. PROCEDURE: Informed written consent was obtained from the patient after a discussion of the risks, benefits and alternatives to treatment. A timeout was performed prior to the initiation of the procedure. Initial ultrasound scanning demonstrates a small to moderate amount of ascites within the left mid lower abdominal quadrant. The left mid to lower abdomen was prepped and draped in the usual sterile fashion. 1% lidocaine was used for local anesthesia. Following this, a 19 gauge, 7-cm, Yueh catheter was introduced. An ultrasound image was saved for documentation purposes. The paracentesis was performed. The catheter was removed and a dressing was applied. The patient tolerated the procedure well without immediate post procedural complication. FINDINGS: A total of approximately 1.7 liters of yellow fluid was  removed. Samples were sent to the laboratory as requested by the clinical team. IMPRESSION: Successful ultrasound-guided diagnostic and therapeutic paracentesis yielding 1.7 liters of peritoneal fluid. Read by: Kevin Allred, PA-C Electronically Signed   By: Adam  Henn M.D.   On: 12/13/2018 17:19   Dg Chest Port 1 View  Result Date: 12/14/2018 CLINICAL DATA:  S/p RT THORA H/o HTN and Colon Ca. EXAM: PORTABLE CHEST 1 VIEW COMPARISON:  Radiograph 12/09/2018 FINDINGS: Port in the anterior chest wall with tip in distal SVC. Small RIGHT effusion noted. No RIGHT pneumothorax. LEFT lung clear. IMPRESSION: No pneumothorax following RIGHT thoracentesis. Electronically Signed   By: Stewart  Edmunds M.D.   On: 12/14/2018 15:09   Dg Chest Port 1 View  Result Date: 11/24/2018 CLINICAL DATA:  Fever, cough, shortness of breath, nausea. EXAM: PORTABLE CHEST 1 VIEW COMPARISON:  November 05, 2018 FINDINGS: Left subclavian approach injectable port in stable position. Cardiomediastinal silhouette is mildly enlarged. Mediastinal contours appear intact. The left lung is clear. Right cardiophrenic airspace opacity and probable right pleural effusion. Previously noted by CT small pulmonary nodules are not seen radiographically. Osseous structures are without acute abnormality. Soft tissues are grossly normal. IMPRESSION: Right cardiophrenic airspace opacity and probable right pleural effusion. Electronically Signed   By: Dobrinka  Dimitrova M.D.   On: 11/24/2018 12:19   Us Ascites (abdomen Limited)  Result Date: 12/02/2018 CLINICAL DATA:  57-year-old male with colon cancer metastatic to the liver. EXAM: LIMITED ABDOMEN ULTRASOUND   FOR ASCITES TECHNIQUE: Limited ultrasound survey for ascites was performed in all four abdominal quadrants. COMPARISON:  CT scan of the abdomen and pelvis 11/24/2018 FINDINGS: Sonographic interrogation of the 4 quadrants of the abdomen demonstrates small volume intraperitoneal ascites. There is a  moderately large right-sided pleural effusion. Fluid was deemed too scant for paracentesis. IMPRESSION: 1. Small volume ascites.  Paracentesis was deferred. 2. Moderate right pleural effusion. Electronically Signed   By: Jacqulynn Cadet M.D.   On: 12/02/2018 16:20   Ir Thoracentesis Asp Pleural Space W/img Guide  Result Date: 12/09/2018 INDICATION: Patient with history of metastatic colon cancer, recurrent right-sided pleural effusion. Request to IR therapeutic right-sided thoracentesis. EXAM: ULTRASOUND GUIDED right THORACENTESIS MEDICATIONS: 10 mL 1% lidocaine COMPLICATIONS: None immediate. PROCEDURE: An ultrasound guided thoracentesis was thoroughly discussed with the patient and questions answered. The benefits, risks, alternatives and complications were also discussed. The patient understands and wishes to proceed with the procedure. Written consent was obtained. Ultrasound was performed to localize and mark an adequate pocket of fluid in the right chest. The area was then prepped and draped in the normal sterile fashion. 1% Lidocaine was used for local anesthesia. Under ultrasound guidance a 6 Fr Safe-T-Centesis catheter was introduced. Thoracentesis was performed. The catheter was removed and a dressing applied. FINDINGS: A total of approximately 2.1 L of clear yellow fluid was removed. IMPRESSION: Successful ultrasound guided right thoracentesis yielding 2.1 L of pleural fluid. Read by Candiss Norse, PA-C Electronically Signed   By: Markus Daft M.D.   On: 12/09/2018 15:01   US Thoracentesis Asp Pleural Space W/img Guide  Result Date: 12/14/2018 INDICATION: Right pleural effusion, colon cancer EXAM: ULTRASOUND GUIDED RIGHT THORACENTESIS MEDICATIONS: 1% lidocaine local COMPLICATIONS: None immediate. PROCEDURE: An ultrasound guided thoracentesis was thoroughly discussed with the patient and questions answered. The benefits, risks, alternatives and complications were also discussed. The patient  understands and wishes to proceed with the procedure. Written consent was obtained. Ultrasound was performed to localize and mark an adequate pocket of fluid in the right chest. The area was then prepped and draped in the normal sterile fashion. 1% Lidocaine was used for local anesthesia. Under ultrasound guidance a 6 Fr Safe-T-Centesis catheter was introduced. Thoracentesis was performed. The catheter was removed and a dressing applied. FINDINGS: A total of approximately 1.4 L of clear pleural fluid was removed. Sample was not sent for laboratory analysis IMPRESSION: Successful ultrasound guided right thoracentesis yielding 1.4 L of pleural fluid. Electronically Signed   By: Jerilynn Mages.  Shick M.D.   On: 12/14/2018 14:52   US Thoracentesis Asp Pleural Space W/img Guide  Result Date: 11/25/2018 INDICATION: Patient with history of shortness of breath, pneumonia, right pleural effusion. Request made for diagnostic and therapeutic thoracentesis. EXAM: ULTRASOUND GUIDED RIGHT DIAGNOSTIC AND THERAPEUTIC THORACENTESIS MEDICATIONS: 10 mL 1% lidocaine COMPLICATIONS: None immediate. PROCEDURE: An ultrasound guided thoracentesis was thoroughly discussed with the patient and questions answered. The benefits, risks, alternatives and complications were also discussed. The patient understands and wishes to proceed with the procedure. Written consent was obtained. Ultrasound was performed to localize and mark an adequate pocket of fluid in the right chest. The area was then prepped and draped in the normal sterile fashion. 1% Lidocaine was used for local anesthesia. Under ultrasound guidance a 6 Fr Safe-T-Centesis catheter was introduced. Thoracentesis was performed. The catheter was removed and a dressing applied. FINDINGS: A total of approximately 1.3 liters of clear, yellow fluid was removed. Samples were sent to the laboratory as requested by the  clinical team. IMPRESSION: Successful ultrasound guided diagnostic and therapeutic  right thoracentesis yielding 1.3 liters of pleural fluid. Read by: Kacie Matthews PA-C Electronically Signed   By: Adam  Henn M.D.   On: 11/25/2018 14:33      LAB RESULTS: Basic Metabolic Panel: Recent Labs  Lab 12/13/18 1020 12/14/18 0402  NA 137 136  K 3.6 3.9  CL 110 106  CO2 22 25  GLUCOSE 135* 95  BUN 19 18  CREATININE 1.28* 1.21  CALCIUM 7.5* 8.0*   Liver Function Tests: Recent Labs  Lab 12/11/18 1918 12/12/18 0535  AST 22 17  ALT 15 13  ALKPHOS 72 61  BILITOT 0.9 0.6  PROT 6.7 5.5*  ALBUMIN 3.4* 2.7*   Recent Labs  Lab 12/11/18 1918  LIPASE 23   No results for input(s): AMMONIA in the last 168 hours. CBC: Recent Labs  Lab 12/13/18 1020 12/14/18 0402  WBC 5.2 4.0  HGB 9.6* 8.3*  HCT 31.5* 28.3*  MCV 91.8 93.1  PLT 241 218   Cardiac Enzymes: No results for input(s): CKTOTAL, CKMB, CKMBINDEX, TROPONINI in the last 168 hours. BNP: Invalid input(s): POCBNP CBG: No results for input(s): GLUCAP in the last 168 hours.    Disposition and Follow-up: Discharge Instructions    Diet - low sodium heart healthy   Complete by: As directed    Increase activity slowly   Complete by: As directed        DISPOSITION: home    DISCHARGE FOLLOW-UP Follow-up Information    Feng, Yan, MD Follow up in 1 week(s).   Specialties: Hematology, Oncology Contact information: [address] [place] [address] [phone]            Time coordinating discharge:  35mins   Signed:   Ripudeep Rai M.D. [facility] 12/14/2018, 5:21 PM        

## 2018-12-14 NOTE — Progress Notes (Signed)
AVS given to patient and explained at the bedside. Medications and follow up appointments have been explained with pt verbalizing understanding.  

## 2018-12-14 NOTE — Evaluation (Signed)
Physical Therapy Evaluation & discharge Patient Details Name: Nicholas Lowery MRN: [id number] DOB: [birth year] Today's Date: 12/14/2018   History of Present Illness  Patient is a 58 year old male with colon cancer with metastasis to the liver, hypertension, anxiety disorder, recurrent diarrhea as well as chronic pain from cancer who presented to the ER  with diarrhea and generalized weakness.  Clinical Impression  Pt did well with PT.  He was able to ambulate entire unit without AD, although cues to slow down.  He managed 4 steps with step through pattern and min/guard with descent.  He reports family can assist him.  No further PT needs identified and will d/c at this time. Thank you for the referral.    Follow Up Recommendations No PT follow up    Equipment Recommendations  None recommended by PT    Recommendations for Other Services       Precautions / Restrictions Precautions Precautions: None      Mobility  Bed Mobility Overal bed mobility: Modified Independent             General bed mobility comments: instructed in log rolling with cues, but no A  Transfers Overall transfer level: Modified independent               General transfer comment: no A needed and safe technique  Ambulation/Gait Ambulation/Gait assistance: Supervision;Min guard Gait Distance (Feet): 400 Feet Assistive device: None Gait Pattern/deviations: Step-through pattern;Drifts right/left     General Gait Details: cues to slow down at times. He tends to drift, but no LOB.  Stairs Stairs: Yes Stairs assistance: Supervision;Min guard Stair Management: One rail Left;Alternating pattern;Forwards Number of Stairs: 4 General stair comments: Ascent with 1 rail and S. On descent he did reach out to therapist hand with other hand. Discussed family assist at home.  Wheelchair Mobility    Modified Rankin (Stroke Patients Only)       Balance                                             Pertinent Vitals/Pain Pain Assessment: 0-10 Pain Score: 4  Pain Location: abd Pain Descriptors / Indicators: Sore;Pressure Pain Intervention(s): Limited activity within patient's tolerance;Monitored during session    Home Living Family/patient expects to be discharged to:: Private residence Living Arrangements: Spouse/significant other   Type of Home: House Home Access: Stairs to enter Entrance Stairs-Rails: Left Entrance Stairs-Number of Steps: 4 Home Layout: Two level;Able to live on main level with bedroom/bathroom Home Equipment: None      Prior Function Level of Independence: Independent         Comments: Reports a little more difficulty reaching feet for ADLs.  He is trying to work as much as possible. Works in a Health and safety inspector job.     Hand Dominance        Extremity/Trunk Assessment   Upper Extremity Assessment Upper Extremity Assessment: Overall WFL for tasks assessed    Lower Extremity Assessment Lower Extremity Assessment: Overall WFL for tasks assessed       Communication   Communication: No difficulties  Cognition Arousal/Alertness: Awake/alert Behavior During Therapy: WFL for tasks assessed/performed Overall Cognitive Status: Within Functional Limits for tasks assessed  General Comments      Exercises     Assessment/Plan    PT Assessment Patent does not need any further PT services  PT Problem List         PT Treatment Interventions      PT Goals (Current goals can be found in the Care Plan section)  Acute Rehab PT Goals PT Goal Formulation: All assessment and education complete, DC therapy    Frequency     Barriers to discharge        Co-evaluation               AM-PAC PT "6 Clicks" Mobility  Outcome Measure Help needed turning from your back to your side while in a flat bed without using bedrails?: None Help needed moving from lying on your back to  sitting on the side of a flat bed without using bedrails?: None Help needed moving to and from a bed to a chair (including a wheelchair)?: None Help needed standing up from a chair using your arms (e.g., wheelchair or bedside chair)?: None Help needed to walk in hospital room?: None Help needed climbing 3-5 steps with a railing? : A Little 6 Click Score: 23    End of Session Equipment Utilized During Treatment: Gait belt Activity Tolerance: Patient tolerated treatment well Patient left: in chair;with call bell/phone within reach Nurse Communication: Mobility status PT Visit Diagnosis: Difficulty in walking, not elsewhere classified (R26.2)    Time: 0850(no charge for time in bathroom)-0917 PT Time Calculation (min) (ACUTE ONLY): 27 min   Charges:   PT Evaluation $PT Eval Low Complexity: 1 Low          Dagan Heinz L. Katrinka Blazing, [place] Pager [phone] 12/14/2018   Enzo Montgomery 12/14/2018, 9:25 AM

## 2018-12-14 NOTE — Procedures (Signed)
Colon ca  S/p RT THORA  1.4 L REMOVED No comp Stable ebl 0 Full report in pacs

## 2018-12-14 NOTE — Evaluation (Signed)
Occupational Therapy Evaluation and Discharge Patient Details Name: Nicholas Lowery MRN: [id number] DOB: [birth year] Today's Date: 12/14/2018    History of Present Illness Patient is a 58 year old male with colon cancer with metastasis to the liver, hypertension, anxiety disorder, recurrent diarrhea as well as chronic pain from cancer who presented to the ER  with diarrhea and generalized weakness.   Clinical Impression   Pt is functioning modified independently in ADL. Issued and instructed in use of AE for LB ADL. Pt verbalizing understanding. Pt declining shower seat. No further OT needs.    Follow Up Recommendations  No OT follow up    Equipment Recommendations  None recommended by OT    Recommendations for Other Services       Precautions / Restrictions Precautions Precautions: None      Mobility Bed Mobility Overal bed mobility: Modified Independent             General bed mobility comments: no difficulty  Transfers Overall transfer level: Independent Equipment used: None             General transfer comment: from bed    Balance                                           ADL either performed or assessed with clinical judgement   ADL Overall ADL's : Modified independent                                       General ADL Comments: Educated and provided pt with sock aid, long handled bath sponge and long shoe horn.  Instructed in benefits of reacher, but pt did not want.     Vision Baseline Vision/History: Wears glasses Wears Glasses: At all times Patient Visual Report: No change from baseline       Perception     Praxis      Pertinent Vitals/Pain Pain Assessment: Faces Pain Score: 4  Pain Location: abd Pain Descriptors / Indicators: Sore;Pressure Pain Intervention(s): Monitored during session     Hand Dominance Right   Extremity/Trunk Assessment Upper Extremity Assessment Upper Extremity  Assessment: Overall WFL for tasks assessed   Lower Extremity Assessment Lower Extremity Assessment: Defer to PT evaluation   Cervical / Trunk Assessment Cervical / Trunk Assessment: Normal   Communication Communication Communication: No difficulties   Cognition Arousal/Alertness: Awake/alert Behavior During Therapy: WFL for tasks assessed/performed Overall Cognitive Status: Within Functional Limits for tasks assessed                                     General Comments       Exercises     Shoulder Instructions      Home Living Family/patient expects to be discharged to:: Private residence Living Arrangements: Spouse/significant other   Type of Home: House Home Access: Stairs to enter [employer] of Steps: 4 Entrance Stairs-Rails: Left Home Layout: Two level;Able to live on main level with bedroom/bathroom Alternate Level Stairs-Number of Steps: flight with landing in the middle Alternate Level Stairs-Rails: Right;Left Bathroom Shower/Tub: Occupational psychologist: Handicapped height     Home Equipment: None          Prior Functioning/Environment Level of Independence:  Independent        Comments: Reports a little more difficulty reaching feet for ADLs.  He is trying to work as much as possible. Works in a Network engineer job.        OT Problem List:        OT Treatment/Interventions:      OT Goals(Current goals can be found in the care plan section) Acute Rehab OT Goals Patient Stated Goal: return home today  OT Frequency:     Barriers to D/C:            Co-evaluation              AM-PAC OT "6 Clicks" Daily Activity     Outcome Measure Help from another person eating meals?: None Help from another person taking care of personal grooming?: None Help from another person toileting, which includes using toliet, bedpan, or urinal?: None Help from another person bathing (including washing, rinsing, drying)?: None Help  from another person to put on and taking off regular upper body clothing?: None Help from another person to put on and taking off regular lower body clothing?: None 6 Click Score: 24   End of Session    Activity Tolerance: Treatment limited secondary to medical complications (Comment)(pt with nausea and vomiting, RN in room) Patient left: in bed;with call bell/phone within reach;with nursing/sitter in room  OT Visit Diagnosis: Pain;Muscle weakness (generalized) (M62.81)                Time: 0158-6825 OT Time Calculation (min): 22 min Charges:  OT General Charges $OT Visit: 1 Visit OT Evaluation $OT Eval Low Complexity: 1 Low  Nestor Lewandowsky, OTR/L Acute Rehabilitation Services Pager: [phone] Office: [phone]  Malka So 12/14/2018, 9:59 AM

## 2018-12-16 ENCOUNTER — Other Ambulatory Visit: Payer: Self-pay | Admitting: Hematology

## 2018-12-16 ENCOUNTER — Telehealth: Payer: Self-pay

## 2018-12-16 ENCOUNTER — Telehealth: Payer: Self-pay | Admitting: Internal Medicine

## 2018-12-16 MED ORDER — OXYCODONE HCL 10 MG PO TABS: 10.0000 mg | ORAL_TABLET | Freq: Four times a day (QID) | ORAL | 0 refills | Status: AC | PRN

## 2018-12-16 MED ORDER — ZOLPIDEM TARTRATE ER 12.5 MG PO TBCR: 12.5000 mg | EXTENDED_RELEASE_TABLET | Freq: Every day | ORAL | 0 refills | Status: AC

## 2018-12-16 MED ORDER — LORAZEPAM 1 MG PO TABS: 1.0000 mg | ORAL_TABLET | ORAL | 0 refills | Status: DC | PRN

## 2018-12-16 NOTE — Telephone Encounter (Signed)
Called patient to schedule Palliative Consult, no answer.  Left message with reason for call along with my name and contact information °

## 2018-12-16 NOTE — Telephone Encounter (Signed)
Faxed script for hospital bed,insurance card and contact information to [facility]. Sent to HIM for scanning to chart.

## 2018-12-17 ENCOUNTER — Telehealth: Payer: Self-pay

## 2018-12-17 NOTE — Telephone Encounter (Signed)
Received phone call from patient to schedule visit with Palliative Care. Verbal consent obtained for Palliative Care. Covid Screen negative. Visit scheduled for 12/19/2018

## 2018-12-18 ENCOUNTER — Inpatient Hospital Stay: Payer: BC Managed Care – PPO

## 2018-12-18 ENCOUNTER — Encounter: Payer: Self-pay | Admitting: Hematology

## 2018-12-18 ENCOUNTER — Other Ambulatory Visit: Payer: Self-pay

## 2018-12-18 ENCOUNTER — Telehealth: Payer: Self-pay | Admitting: Hematology

## 2018-12-18 ENCOUNTER — Inpatient Hospital Stay: Payer: BC Managed Care – PPO | Attending: Hematology

## 2018-12-18 ENCOUNTER — Inpatient Hospital Stay (HOSPITAL_BASED_OUTPATIENT_CLINIC_OR_DEPARTMENT_OTHER): Payer: BC Managed Care – PPO | Admitting: Hematology

## 2018-12-18 VITALS — BP 126/99 | HR 114 | Temp 98.7°F | Resp 17 | Ht 70.0 in | Wt 177.1 lb

## 2018-12-18 DIAGNOSIS — J910 Malignant pleural effusion: Secondary | ICD-10-CM | POA: Diagnosis not present

## 2018-12-18 DIAGNOSIS — R188 Other ascites: Secondary | ICD-10-CM | POA: Diagnosis not present

## 2018-12-18 DIAGNOSIS — I10 Essential (primary) hypertension: Secondary | ICD-10-CM | POA: Insufficient documentation

## 2018-12-18 DIAGNOSIS — J189 Pneumonia, unspecified organism: Secondary | ICD-10-CM | POA: Insufficient documentation

## 2018-12-18 DIAGNOSIS — F329 Major depressive disorder, single episode, unspecified: Secondary | ICD-10-CM | POA: Insufficient documentation

## 2018-12-18 DIAGNOSIS — R000 Tachycardia, unspecified: Secondary | ICD-10-CM | POA: Diagnosis not present

## 2018-12-18 DIAGNOSIS — G4700 Insomnia, unspecified: Secondary | ICD-10-CM | POA: Insufficient documentation

## 2018-12-18 DIAGNOSIS — D509 Iron deficiency anemia, unspecified: Secondary | ICD-10-CM | POA: Insufficient documentation

## 2018-12-18 DIAGNOSIS — C182 Malignant neoplasm of ascending colon: Secondary | ICD-10-CM | POA: Diagnosis present

## 2018-12-18 DIAGNOSIS — C786 Secondary malignant neoplasm of retroperitoneum and peritoneum: Secondary | ICD-10-CM | POA: Diagnosis not present

## 2018-12-18 DIAGNOSIS — I158 Other secondary hypertension: Secondary | ICD-10-CM | POA: Diagnosis not present

## 2018-12-18 DIAGNOSIS — F419 Anxiety disorder, unspecified: Secondary | ICD-10-CM | POA: Diagnosis not present

## 2018-12-18 DIAGNOSIS — C189 Malignant neoplasm of colon, unspecified: Secondary | ICD-10-CM

## 2018-12-18 DIAGNOSIS — C787 Secondary malignant neoplasm of liver and intrahepatic bile duct: Secondary | ICD-10-CM

## 2018-12-18 DIAGNOSIS — Z79899 Other long term (current) drug therapy: Secondary | ICD-10-CM | POA: Diagnosis not present

## 2018-12-18 DIAGNOSIS — C7800 Secondary malignant neoplasm of unspecified lung: Secondary | ICD-10-CM | POA: Diagnosis not present

## 2018-12-18 DIAGNOSIS — G893 Neoplasm related pain (acute) (chronic): Secondary | ICD-10-CM | POA: Diagnosis not present

## 2018-12-18 DIAGNOSIS — R197 Diarrhea, unspecified: Secondary | ICD-10-CM

## 2018-12-18 DIAGNOSIS — Z95828 Presence of other vascular implants and grafts: Secondary | ICD-10-CM

## 2018-12-18 LAB — C DIFFICILE QUICK SCREEN W PCR REFLEX
C Diff antigen: NEGATIVE
C Diff interpretation: NOT DETECTED
C Diff toxin: NEGATIVE

## 2018-12-18 LAB — CULTURE, BODY FLUID W GRAM STAIN -BOTTLE: Culture: NO GROWTH

## 2018-12-18 LAB — CBC WITH DIFFERENTIAL (CANCER CENTER ONLY)
Abs Immature Granulocytes: 0.02 10*3/uL (ref 0.00–0.07)
Basophils Absolute: 0.1 10*3/uL (ref 0.0–0.1)
Basophils Relative: 1 %
Eosinophils Absolute: 0.1 10*3/uL (ref 0.0–0.5)
Eosinophils Relative: 2 %
HCT: 32.7 % — ABNORMAL LOW (ref 39.0–52.0)
Hemoglobin: 10.2 g/dL — ABNORMAL LOW (ref 13.0–17.0)
Immature Granulocytes: 0 %
Lymphocytes Relative: 11 %
Lymphs Abs: 0.6 10*3/uL — ABNORMAL LOW (ref 0.7–4.0)
MCH: 27.7 pg (ref 26.0–34.0)
MCHC: 31.2 g/dL (ref 30.0–36.0)
MCV: 88.9 fL (ref 80.0–100.0)
Monocytes Absolute: 0.8 10*3/uL (ref 0.1–1.0)
Monocytes Relative: 14 %
Neutro Abs: 4 10*3/uL (ref 1.7–7.7)
Neutrophils Relative %: 72 %
Platelet Count: 334 10*3/uL (ref 150–400)
RBC: 3.68 MIL/uL — ABNORMAL LOW (ref 4.22–5.81)
RDW: 15 % (ref 11.5–15.5)
WBC Count: 5.7 10*3/uL (ref 4.0–10.5)
nRBC: 0 % (ref 0.0–0.2)

## 2018-12-18 LAB — CMP (CANCER CENTER ONLY)
ALT: 15 U/L (ref 0–44)
AST: 30 U/L (ref 15–41)
Albumin: 2.8 g/dL — ABNORMAL LOW (ref 3.5–5.0)
Alkaline Phosphatase: 73 U/L (ref 38–126)
Anion gap: 12 (ref 5–15)
BUN: 12 mg/dL (ref 6–20)
CO2: 22 mmol/L (ref 22–32)
Calcium: 8.7 mg/dL — ABNORMAL LOW (ref 8.9–10.3)
Chloride: 102 mmol/L (ref 98–111)
Creatinine: 1.12 mg/dL (ref 0.61–1.24)
GFR, Est AFR Am: >60 mL/min (ref >60)
GFR, Estimated: >60 mL/min (ref >60)
Glucose, Bld: 108 mg/dL — ABNORMAL HIGH (ref 70–99)
Potassium: 3.9 mmol/L (ref 3.5–5.1)
Sodium: 136 mmol/L (ref 135–145)
Total Bilirubin: 0.4 mg/dL (ref 0.3–1.2)
Total Protein: 5.7 g/dL — ABNORMAL LOW (ref 6.5–8.1)

## 2018-12-18 LAB — CEA (IN HOUSE-CHCC): CEA (CHCC-In House): 61.31 ng/mL — ABNORMAL HIGH (ref 0.00–5.00)

## 2018-12-18 MED ORDER — HEPARIN SOD (PORK) LOCK FLUSH 100 UNIT/ML IV SOLN
500.0000 [IU] | Freq: Once | INTRAVENOUS | Status: AC
  Administered 2018-12-18: 11:00:00 500 [IU]
  Filled 2018-12-18: qty 5

## 2018-12-18 MED ORDER — FENTANYL 75 MCG/HR TD PT72: 1.0000 | MEDICATED_PATCH | TRANSDERMAL | 0 refills | Status: DC

## 2018-12-18 MED ORDER — SODIUM CHLORIDE 0.9% FLUSH
10.0000 mL | Freq: Once | INTRAVENOUS | Status: AC
  Administered 2018-12-18: 10 mL
  Filled 2018-12-18: qty 10

## 2018-12-18 MED ORDER — MEGESTROL ACETATE 625 MG/5ML PO SUSP: 625.0000 mg | Freq: Every day | ORAL | 2 refills | Status: AC

## 2018-12-18 NOTE — Progress Notes (Signed)
[place]   [phone] Fax:([phone]) [phone]   Clinic Follow up Note   Patient Care Team: Curlene Labrum, MD as PCP - General (Family Medicine)  Date of Service:  12/18/2018  CHIEF COMPLAINT:  F/u of metastatic colon cancer  SUMMARY OF ONCOLOGIC HISTORY: Oncology History Overview Note  Cancer Staging Metastatic colon cancer to liver [facility]) Staging form: Colon and Rectum, AJCC 8th Edition - Clinical stage from 06/01/2016: Stage IVA (cTX, cNX, pM1a) - Signed by Truitt Merle, MD on 07/04/2016 - Pathologic stage from 06/14/2016: Stage IVA (pT4b(m), pN2b, pM1a) - Signed by Truitt Merle, MD on 07/04/2016     Metastatic colon cancer to liver ([place])  04/2015 Procedure   Colonoscopy by Dr. Ladona Horns. It showed showed 2 sessile polyps ready between 3-5 mm in size located 20 cm (A, B) from the point of entry, polypectomy was performed. Pedunculated polyp was found in the ascending colon (C), polypectomy was performed, and additional polyp (D) was found 30 cm from the point of entry, removed   04/2015 Pathology Results   tubular adenoma (A and B), and well differentiated adenocarcinoma arising from tubulovillous adenoma (C) and well differentiated adenocarcinoma arising from severe dysplasia to intramucosal carcinoma within tubular adenoma.    04/2015 Initial Diagnosis   Metastatic colon cancer to liver ([place])   05/29/2016 Imaging   CT abdomen and pelvis with contrast showed an apple core like stricture in right colon just above the cecum, measuring 3.2 cm in lengths. This is highly suspicious for malignancy. Small lymph node a noticed he had adjacent mesentery, measuring 8 mm. There is a low-density lesion within the inferior aspect of the right hepatic lobe measuring 1.7 cm, suspicious for metastasis.    06/06/2016 Tumor Marker   CEA 9.99   06/08/2016 PET scan   IMPRESSION: Approximately 3 cm hypermetabolic mass in the ascending colon, consistent with primary colon carcinoma.  This mass results in colonic obstruction and small bowel dilatation. Additional areas of hypermetabolic wall thickening in the cecum may represent other sites of colon carcinoma or colitis. Mild hypermetabolic lymphadenopathy in right pericolonic region, porta hepatis, and aortocaval space, consistent with metastatic disease. Mild hypermetabolic mediastinal lymphadenopathy also seen, and thoracic lymph node metastases cannot be excluded. Solitary hypermetabolic focus in inferior right hepatic lobe, consistent with liver metastasis. Consider abdomen MRI without and with contrast for further evaluation.   06/14/2016 Surgery   Hand assisted right hemicolectomy and small bowel resection for colon cancer, liver biopsy, by Dr. Barry Dienes   06/14/2016 Pathology Results   Right hemicolectomy showed invasive well to moderately differentiated adenocarcinoma, 2 foci measuring 7.5 cm and 4.5 cm, tumor invades through full thickness of colon, to the seroma and involve the Small Bowel, Surgical Margins Were Negative, 24 Out Of 64 Lymph Nodes Were Positive, Extracapsular Extension Identified, Multiple Satellite Tumor Deposits Present, Liver Biopsy Showed Metastatic Adenocarcinoma.     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 No BRAF and NRAS mutation. MSI-stable, tumor burden low.   07/06/2016 Tumor Marker   CEA 13.69   07/13/2016 - 01/18/2017 Chemotherapy   mFOLFOX, every 2 weeks, started on 07/14/2015, Avastin added from cycle 3  Oxaliplatin dose to 74m/m2 due to side effects and some cytopenia on 09/21/16  Changed to FOLFIRINOX starting cycle 7 and Reduced  Due to neuropathy hold Oxaliplatin and add Irinotecan with neulasta  on day 3 starting with cycle 7 Add low dose Oxaliplatin with cycle 8.  Due to his worsening neuropathy, and good response to chemotherapy, I previously stopped  oxaliplatin from cycle 11, and continue FOLFIRI and avastin      07/27/2016 Tumor Marker   CEA 15.85   09/04/2016 Imaging   Ct C/A/P W Contrast IMPRESSION: Interval right colectomy. Stable small liver metastasis in the inferior right hepatic lobe. Stable mild porta hepatis and aortocaval lymphadenopathy. Stable mild mediastinal lymphadenopathy. No new or progressive metastatic disease identified within the chest, abdomen, or pelvis.   12/19/2016 PET scan   IMPRESSION: 1. Right hemicolectomy, with resolution of the prior hypermetabolic activity inferiorly in the right hepatic lobe, in several mediastinal lymph nodes, and in lymph nodes in the retroperitoneum and porta hepatis. No residual hypermetabolic or enlarged lymph nodes are identified. 2. Low-grade diffuse skeletal metabolic activity is likely therapy related. 3. Coronary atherosclerosis. 4. 3 by 4 mm right middle lobe pulmonary nodule is stable, not appreciably hypermetabolic, but below sensitive PET-CT size thresholds. This may warrant surveillance.   01/11/2017 Imaging   MR Abdomen W WO Contrast IMPRESSION: 1. No acute findings within the abdomen. Previously noted liver metastasis has resolved in the interval. No new lesions.   02/01/2017 - 09/2017 Chemotherapy   Maintenance therapy, Xeloda 2056m (1000 mg/m2)  q12h on day 1-14 every 21 days plus AVASTIN, starting 02/01/2017.  stopped after 12 days due to poor tolerance on 02/14/17  Changed to maintenance 5-FU and avastin every 2 weeks starting on 02/22/17, stopped in 09/2017.      04/10/2017 Imaging   IMPRESSION: 1. Status post right hemicolectomy without findings for recurrent tumor. 2. No worrisome hepatic lesions. Treated disease with only a small residual low attenuation lesion in the right hepatic lobe. 3. No recurrent mediastinal or abdominal lymphadenopathy.    07/09/2017 PET scan   PET 07/09/17 IMPRESSION: 1. Status post right hemicolectomy, without findings  of hypermetabolic recurrent or metastatic disease. 2. A right middle lobe pulmonary nodule is unchanged. However, there is a right lower lobe 5 mm pulmonary nodule which is felt to be new and enlarged compared to prior exams. Suspicious for an isolated pulmonary metastasis. Consider CT follow-up at 3-6 months. 3. Age advanced coronary artery atherosclerosis. Recommend assessment of coronary risk factors and consideration of medical therapy. 4. Borderline ascending aortic dilatation, 4.0 cm     10/10/2017 Imaging   IMPRESSION: 1. Several (at least 10) subcentimeter pulmonary nodules scattered in both lungs, predominantly in the lower lobes, all new/increased, most compatible with enlarging pulmonary metastases, largest 8 mm in the right lower lobe. 2. No additional findings of new or progressive metastatic disease. No recurrent adenopathy. Stable small low-attenuation lesion in the inferior right liver lobe compatible with treated metastasis. No new liver metastases. 3. Stable ectatic 4.0 cm ascending thoracic aorta. Recommend annual imaging followup by CTA or MRA.  4. Stable mild splenomegaly.    10/11/2017 - 12/2017 Chemotherapy   FOLFIRI and Avastin every 2 weeks starting 10/11/17, irnotecan stopped in 12/2017 due to disease progression     01/08/2018 Imaging   01/08/2018 CT CAP IMPRESSION: 1. Progressive hepatic and pulmonary metastatic disease. 2. Ascending Aortic aneurysm NOS (ICD10-I71.9).   01/08/2018 Progression   01/08/2018 CT CAP IMPRESSION: 1. Progressive hepatic and pulmonary metastatic disease. 2. Ascending Aortic aneurysm NOS (ICD10-I71.9).   01/10/2018 - 03/07/2018 Chemotherapy   FOLFOX and Avastin every 2 weeks starting on 01/10/2018. Stopped due to disease progression  03/19/2018 Progression   03/19/2018 CT CAP IMPRESSION: 1. Interval increase in size of dominant nodule within the right lower lobe. There are a few additional nodules which have increased in size.  Multiple additional small bilateral pulmonary nodules are grossly similar. 2. Slight interval increase in size of lesion within the right hepatic lobe. 3. Interval increase in soft tissue at the surgical anastomosis involving the small bowel within the central abdomen. Locally recurrent disease not excluded.    03/21/2018 - 05/15/2018 Chemotherapy   Third lineXeloda 1579m BID 7 days on/7 days off and Avastin every 2 weeksstarting 04/07/18 stopped after 1 cycle and 4 days because he started Clinical trail.    05/22/2018 [facility]   [facility]   LINES AND TUBES: None.  LOWER THORAX: Please see same day CT chest for findings above the diaphragm..Marland Kitchen HEPATOBILIARY: Low-attenuation lesion in segment 5 has minimally increased in size from prior measuring 2.4 x 2.0 cm, previously 2.3 x 1.8 cm when remeasured (8:42). The gallbladder is present and otherwise unremarkable. No biliary dilatation.   SPLEEN: Unremarkable. PANCREAS: Unremarkable.  ADRENALS: Unremarkable. KIDNEYS/URETERS: Unremarkable.  BLADDER: Decompressed making further evaluation difficult. PELVIC/REPRODUCTIVE ORGANS: Unremarkable.  GI TRACT: No dilated or thick walled loops of bowel. Sequelae of right hemicolectomy and appendectomy.   PERITONEUM/RETROPERITONEUM AND MESENTERY: Minimally decreased spiculated mesenteric soft tissue density at the site of enteroenteric anastomosis, now measuring roughly 2.6 x 1.4 cm, previously 3.3 x 3.1 cm (8:54). There is also a mild amount of stranding near the anastomosis within the mid abdomen (8:54)   LYMPH NODES: Borderline enlarged 1.0 cm porta hepatic lymph node, previously 0.7 cm (8:26). VESSELS: The aorta is normal in caliber.  No significant calcified atherosclerotic disease. Though the contrast timing is suboptimal for evaluation of the portal venous system, the portal venous system is appears patent. The hepatic veins and IVC are unremarkable.  BONES AND SOFT  TISSUES: Small fat-containing ventral hernia..   05/24/2018 - 08/02/2018 Chemotherapy   [facility] [id number] with palbociclib (Ibrnace) 1032m3 weeks on/1 week off and binimetinib 15 mg BID starting 05/24/18. Stopped 08/02/18 due to disease progression.    08/02/2018 Imaging   [facility]  Since 05/22/2018:  -Unchanged to mildly decreased in size segment 6 hepatic lesion.  -Unchanged irregular soft tissue density at the enteroenteric anastomosis.  -Increase in size of an irregular soft tissue density at the enterocolic anastomosis. This is new since 03/19/2018.  -Mild decrease in size of porta hepatis lymph node.  IMPRESSION:  Slight interval increase in multiple metastatic pulmonary nodules.  New likely malignant small right pleural effusion. Interval prominent right infrahilar lymph nodes; metastasis is in the differential. Mild interlobular septal thickening in the right middle and lower lobes is concerning for lymphangitic tumor spread. Attention on follow-up is suggested.     08/29/2018 Imaging   CT AP 08/29/18 IMPRESSION: 1. No acute CT findings of the abdomen or pelvis to explain abdominal pain.  2. Evidence of worsening metastatic colon malignancy status post right colon resection, including increasing mesenteric nodularity, increasing peritoneal nodularity, enlarging liver metastasis, and new and enlarging pulmonary nodules.  3. Trace perihepatic ascites, of uncertain etiology, although presumably malignant given peritoneal metastatic disease.   09/02/2018 Imaging   CT chest 09/02/18  IMPRESSION: 1. Unfortunately there is interval increase in size of bilateral pulmonary nodules consistent with progression of pulmonary metastasis. 2. New small moderate RIGHT pleural effusion. 3. Interval increase in size of  small mediastinal lymph nodes most consistent with metastatic adenopathy. 4. Interval increase in size of hepatic metastasis in  the inferior RIGHT hepatic lobe compared to CT 03/19/2018.   09/04/2018 - 11/2018 Chemotherapy   oral Regorafenib 37m daily 3 weeks on, 1 week off and IV Nivolumab 349mkg q2weeks starting 09/04/18. Nivo d/c after cycle 2 due to lack of coverage. Will stop week of 11/18/18 due to disease progression.    11/13/2018 Imaging   CT CAP W Constrast  IMPRESSION: 1. There is been interval enlargement of segment 6 liver metastases. Additionally mesenteric tumor at the enterocolonic anastomosis is mildly increased in size from previous exam. Peritoneal deposit along at the umbilicus is mildly increased in size in the interval. New ascites. Findings are concerning for mild progression of disease within the abdomen and pelvis. 2. No significant change in diffuse pulmonary metastases. Stable volume of right pleural effusion. 3. Previous index mesenteric lymph nodes are mildly increased in size in the interval. 4. New small pericardial effusion.   11/25/2018 Pathology Results   CYTOLOGY Diagnosis PLEURAL FLUID, RIGHT (SPECIMEN 1 OF 1 COLLECTED 11/25/18): - MALIGNANT CELLS CONSISTENT WITH METASTATIC ADENOCARCINOMA - SEE COMMENT   12/13/2018 Pathology Results   Cytology from Paracentesis  Diagnosis PERITONEAL/ASCITIC FLUID(SPECIMEN 1 OF 1 COLLECTED 12/13/18): MALIGNANT CELLS CONSISTENT WITH METASTATIC ADENOCARCINOMA.   12/2018 -  Chemotherapy   Lonsurf starting on 12/23/18 with 8036mn the am and 66m37m the pm, M-F 2 weeks on/2 weeks off.       CURRENT THERAPY:  Lonsurf starting on 12/23/18 with 80mg30mthe am and 66mg 46mhe pm, M-F 2 weeks on/2 weeks off.   INTERVAL HISTORY:  [place] for a follow up after recent hospitalization. He called wife to be included  He was admitted to the hospital on 12/11/18 for acute enteritis. He has been having diarrhea with no solid stool, fatigue and no appetite. He still on Levaquin antibiotics. I reviewed medication list with him. He saw Dr.  White Dema Severintarted him on Klonopin. He first takes Ambien for sleep. He takes ativan for anxiety. He takes Klonopin 2 hours later if needed.  He takes oxycodone 2 tabs 2-3 times a day for pain. He is still on 75mcg 31manyl patch. Pain overall controlled.  He notes he had stool incontinence earlier today and did not know this. He was told this was related to antibiotics which he is almost done with. He notes he plans to have paracentesis due to abdominal distention. He would need COVID test before procedure. He notes he tries to walk for 30 minutes. He has diarrhea 3-4 times day.  He notes he is able to keep food down but has 0 appetite. He is forcing himself to eat half the plates he has. He notes only very mild nausea. He has been drinking carnation instant breakfast once a day. He has been drinking more water.  He notes he still has abdominal distention and has trouble laying down flat due to pleural effusion. He just bought a adjustable bed at home.   REVIEW OF SYSTEMS:   Constitutional: Denies fevers, chills (+) very low appetite (+) weight loss  Eyes: Denies blurriness of vision Ears, nose, mouth, throat, and face: Denies mucositis or sore throat Respiratory: Denies cough, dyspnea or wheezes Cardiovascular: Denies palpitation, chest discomfort or lower extremity swelling Gastrointestinal:  Denies nausea, heartburn (+) diarrhea (+) stool incontinence (+) bloating.  Skin: Denies abnormal skin rashes Lymphatics: Denies new lymphadenopathy or easy  bruising Neurological:Denies numbness, tingling or new weaknesses Behavioral/Psych: Mood is stable, no new changes  All other systems were reviewed with the patient and are negative.  MEDICAL HISTORY:  Past Medical History:  Diagnosis Date   Anxiety    Cancer of ascending colon ([place])    Hypertension    Seasonal allergies     SURGICAL HISTORY: Past Surgical History:  Procedure Laterality Date   APPENDECTOMY  1992   CATARACT EXTRACTION  W/ INTRAOCULAR LENS IMPLANT Left 04/2016   COLON SURGERY     COLONOSCOPY W/ BIOPSIES AND POLYPECTOMY  04/2015   ESOPHAGOGASTRODUODENOSCOPY (EGD) WITH PROPOFOL N/A 11/27/2018   Procedure: ESOPHAGOGASTRODUODENOSCOPY (EGD) WITH PROPOFOL;  Surgeon: Wonda Horner, MD;  Location: [facility];  Service: Endoscopy;  Laterality: N/A;   INGUINAL HERNIA REPAIR Right 1982   IR RADIOLOGIST EVAL & MGMT  01/16/2017   IR THORACENTESIS ASP PLEURAL SPACE W/IMG GUIDE  12/09/2018   KNEE ARTHROSCOPY W/ MENISCECTOMY Right 1998   LAPAROSCOPIC RIGHT COLECTOMY N/A 06/14/2016   Procedure: LAPAROSCOPIC HAND ASSISTED HEMICOLECTOMY AND SMALL BOWEL RESECTION.;  Surgeon: [name], MD;  Location: [place];  Service: General;  Laterality: N/A;   LIVER BIOPSY Right 06/14/2016   Procedure: LIVER BIOPSY;  Surgeon: [name], MD;  Location: [place];  Service: General;  Laterality: Right;  Right Inferior Liver   PORTACATH PLACEMENT N/A 07/06/2016   Procedure: INSERTION PORT-A-CATH;  Surgeon: [name], MD;  Location: [place];  Service: General;  Laterality: N/A;   VASECTOMY  2005    I have reviewed the social history and family history with the patient and they are unchanged from previous note.  ALLERGIES:  is allergic to penicillins; xeloda [capecitabine]; and nivolumab.  MEDICATIONS:  Current Outpatient Medications  Medication Sig Dispense Refill   albuterol (VENTOLIN HFA) 108 (90 Base) MCG/ACT inhaler Inhale 1-2 puffs into the lungs every 6 (six) hours as needed for wheezing or shortness of breath. (Patient taking differently: Inhale 1-2 puffs into the lungs every 4 (four) hours as needed for wheezing or shortness of breath. ) 1 Inhaler 2   amLODipine (NORVASC) 10 MG tablet Take 1 tablet (10 mg total) by mouth daily. 30 tablet 5   cyclobenzaprine (FLEXERIL) 5 MG tablet Take 1 tablet (5 mg total) by mouth 3 (three) times daily as needed for muscle spasms. 30 tablet 0   fluticasone (FLOVENT  HFA) 110 MCG/ACT inhaler Inhale 2 puffs into the lungs 2 (two) times daily. (Patient taking differently: Inhale 2 puffs into the lungs 2 (two) times a day. ) 1 Inhaler 12   furosemide (LASIX) 20 MG tablet Take 0.5 tablets (10 mg total) by mouth daily. 30 tablet 2   gabapentin (NEURONTIN) 300 MG capsule TAKE ONE CAPSULE BY MOUTH THREE TIMES DAILY (Patient taking differently: Take 300 mg by mouth 3 (three) times daily as needed (nerve pain). ) 90 capsule 1   levofloxacin (LEVAQUIN) 500 MG tablet Take 1 tablet (500 mg total) by mouth daily for 5 days. 5 tablet 0   LORazepam (ATIVAN) 1 MG tablet Take 1 tablet (1 mg total) by mouth every 4 (four) hours as needed for anxiety or sleep (nausea). 20 tablet 0   metroNIDAZOLE (FLAGYL) 500 MG tablet Take 1 tablet (500 mg total) by mouth 3 (three) times daily for 5 days. 15 tablet 0   Multiple Vitamin (MULTIVITAMIN WITH MINERALS) TABS tablet Take 1 tablet by mouth daily.     naloxone (NARCAN) nasal spray 4 mg/0.1 mL Place 1  spray into the nose once as needed (emergency).      ondansetron (ZOFRAN ODT) 8 MG disintegrating tablet Take 1 tablet (8 mg total) by mouth every 8 (eight) hours as needed for nausea or vomiting. 20 tablet 0   Oxycodone HCl 10 MG TABS Take 1-2 tablets (10-20 mg total) by mouth every 6 (six) hours as needed. 100 tablet 0   polyethylene glycol (MIRALAX / GLYCOLAX) 17 g packet Take 17 g by mouth daily as needed for mild constipation.     prochlorperazine (COMPAZINE) 10 MG tablet Take 1 tablet (10 mg total) by mouth every 6 (six) hours as needed for nausea or vomiting. 30 tablet 3   senna-docusate (SENOKOT-S) 8.6-50 MG tablet Take 2 tablets by mouth 2 (two) times daily. (Patient taking differently: Take 2 tablets by mouth as needed for mild constipation. )     trifluridine-tipiracil (LONSURF) 20-8.19 MG tablet Take 4 tabs (12m trifluridine) PO in AM & 3 tabs (656mtrifluridine) in PM, immediately after food on days 1-5 & 8-12 of each  28d cycle 70 tablet 0   venlafaxine XR (EFFEXOR-XR) 150 MG 24 hr capsule TAKE ONE CAPSULE BY MOUTH DAILY WITH BREAKFAST (Patient taking differently: Take 150 mg by mouth daily with breakfast. ) 90 capsule 1   zolpidem (AMBIEN CR) 12.5 MG CR tablet Take 1 tablet (12.5 mg total) by mouth at bedtime. 30 tablet 0   fentaNYL (DURAGESIC) 75 MCG/HR Place 1 patch onto the skin every other day. 15 patch 0   megestrol (MEGACE ES) 625 MG/5ML suspension Take 5 mLs (625 mg total) by mouth daily. 150 mL 2   No current facility-administered medications for this visit.     PHYSICAL EXAMINATION: ECOG PERFORMANCE STATUS: 2 - Symptomatic, <50% confined to bed  Vitals:   12/18/18 1114  BP: (!) 126/99  Pulse: (!) 114  Resp: 17  Temp: 98.7 F (37.1 C)  SpO2: 98%   Filed Weights   12/18/18 1114  Weight: 177 lb 1.6 oz (80.3 kg)    GENERAL:alert, no distress and comfortable SKIN: skin color, texture, turgor are normal, no rashes or significant lesions EYES: normal, Conjunctiva are pink and non-injected, sclera clear  NECK: supple, thyroid normal size, non-tender, without nodularity LYMPH:  no palpable lymphadenopathy in the cervical, axillary  LUNGS: clear to auscultation and percussion with normal breathing effort (+) right pleural effusion  HEART: regular rate & rhythm and no murmurs and no lower extremity edema  ABDOMEN: normal bowel sounds (+) abdominal distention with mild ascites and tenderness  Musculoskeletal:no cyanosis of digits and no clubbing  NEURO: alert & oriented x 3 with fluent speech, no focal motor/sensory deficits  LABORATORY DATA:  I have reviewed the data as listed CBC Latest Ref Rng & Units 12/14/2018 12/13/2018 12/12/2018  WBC 4.0 - 10.5 K/uL 4.0 5.2 7.5  Hemoglobin 13.0 - 17.0 g/dL 8.3(L) 9.6(L) 11.0(L)  Hematocrit 39.0 - 52.0 % 28.3(L) 31.5(L) 34.9(L)  Platelets 150 - 400 K/uL 218 241 304     CMP Latest Ref Rng & Units 12/18/2018 12/14/2018 12/13/2018  Glucose 70 - 99  mg/dL 108(H) 95 135(H)  BUN 6 - 20 mg/dL _0 Creatinine 0.61 - 1.24 mg/dL 1.12 1.21 1.28(H)  Sodium 135 - 145 mmol/L 136 136 137  Potassium 3.5 - 5.1 mmol/L 3.9 3.9 3.6  Chloride 98 - 111 mmol/L 102 106 110  CO2 22 - 32 mmol/L _1 Calcium 8.9 - 10.3 mg/dL 8.7(L) 8.0(L) 7.5(L)  Total Protein 6.5 - 8.1 g/dL 5.7(L) - -  Total Bilirubin 0.3 - 1.2 mg/dL 0.4 - -  Alkaline Phos 38 - 126 U/L 73 - -  AST 15 - 41 U/L 30 - -  ALT 0 - 44 U/L 15 - -      RADIOGRAPHIC STUDIES: I have personally reviewed the radiological images as listed and agreed with the findings in the report. No results found.   ASSESSMENT & PLAN:  Nicholas Lowery is a 58 y.o. male with   1. Right colon cancer with liver, node and lung metastasis, pT4bN2bM1, stage IV, MSI-stable, KRAS G12D mutation (+), plural and peritoneal mets in 11/2018 -He was diagnosed in 04/2015. He is s/pright hemicolectomy due to bowel obstruction -He has had multiple line chemo, includingFOLFOX,FOLFIRINOX,FOLFIRI,maintenance 5-FU, Xeloda andAvastin(briefly), and[id number] 3 weeks on/1 week off andbinimetinib 15 mgBID starting 1/10/20withDr. LTruman Hayward-[facility] anddeveloped severe abdominal painsecondary to peritoneal metastasis. -He proceeded with4thline therapy withRegorafeniband Nivo based on the REGONIVO clinical trialdata(JCO 37(15), 2019).He started on 09/04/18. -He wasnotable to gain coverage for Nivolamab, so we canceled after cycle 2and he continuedRegorafenib and titrateddose up. Unfortunately after about 3 months of Ragnorafinib CT CAP from 11/13/18 shows disease progression and regimen was stopped, last dose 11/20/2018 -[facility]. LTruman Hayward[facility] office with an opportunity for a phase 1 clinical trial of a MEKinhibitor. The patient is interested in this opportunity. However, given his decreased performance  status, recent rapid decline and repeat hospitalizations his clinical trail screening was canceled and will not be proceeding with their treatment. -He was hospitalized on 12/11/18 for acute enteritis. He was treated with antibiotics Flagyl and Levaquin. He has continued to have diarrhea after discharge. Will obtain Stool sample to evaluate for C. diff. He is agreeable. If negative, will stop antibiotics.  -He has received Lonsurf by mail. He is still very interested in trying this treatment to prolonging his life as long as possible. He understands his poor prognosis. Will start Lonsurf on 12/23/18 with 865min the am and 604mn the pm, M-F 2 weeks on/2 weeks off, next week on 8/10  -Labs reviewed, CMP WNL except BG 108, Ca 8.7, protein 5.7, albumin 2.8. CEA and urine protein still pending. Has right pleural effusion and mild ascites on exam today. Will set up Thoracentesis and Paracentesis within a week.  -F/u in 2 weeks. Will request his wife be there in person.    2. Recent PNA, Malignant Pleural Effusion and ascites  -With recent PNA he underwent thoracentesis on 11/25/18. Cytology showed malignant cells consistent with metastatic adenocarcinoma. -He also had a paracentesis on 12/13/18 with 1.7 L removed. Cytology on sample showed it was consistent with metastatic Adenocarcinoma.  -With oral 31m31msix his breathing has improved, but not at baseline, and his LE has resolved. I previously reduced lasix to half tablet daily only as needed and increase water intake due to prior elevated Cr.  -I discussed given his malignant Pleural effusion and ascites, this will start to recur more often as his disease progresses. He understands.  -Last thoracentesis on 12/14/18 with 1.4 liters removed, last paracentesis on 12/13/18 with 7.1 L removed.  -His Cr normal today (12/18/18). -He is again abdominally distended and has right pleural effusion today (12/18/18), will set up paracentesis and Thoracentesis within a week  with COVID-19 testing before this.   3. Upper abdominal pain  -CT AP from 08/29/18 showed worsening metastatic colon malignancy and mesenteric  and peritoneal nodularity. -His worsening pain is related to his disease progression exacerbated by his anxiety and depression. -He stopped Gabapentin as this was not helping him.  -His pain is well controlled on 57mg every 2 days and Oxycodone 22m2-3 times a day. I refilled Fentanyl today (12/18/18) -continue flexeril as needed   4. Depression, Anxiety, Insomnia -Pt has long-standing history of depression, has been on Prozac for decades, it was very well controlled in the past.  -He has good family and social support. -Ipreviouslyoffered chance to speak with our [facility] counseling.He declined for now. -He denies suicidal or self harm thought or ideology. -Heis sleeping better now that pain is controlled.  -I encouraged him not to stress about work and to speak to someone andrelease emotions. -Stable mood, he has declined counseling for now. -He feels with latest disease progression he feels this is expected, but stillveryemotional. He is willing to talk to [facility] has been taking Gabapentin TID -His increased pain and dyspnea has made it difficult for him to sleep and has been using pain meds to help before bed. I discussed he should not take pain meds.  -Ambien continues to not be enough. He will d/s Ambien. His PCP Dr. WhDema Severintarted him on Klonopin. He will continue for sleep and continue Ativan for anxiety. He knows to not take at the same time.   5. Hypertension, Tachycardia  -OnAmlodipineand Lisinopril, I refilled today -He does not want to follow-up with his primary care physician. -Will monitor asRegorafenibmay increase his blood pressure. -BP at126/99today (12/18/18),stable.I encouraged him to monitor at home. HG 114 today  6. Anemia of iron  deficiency and chemo  -He has mild anemia, likely related to his colon cancer bleeding.  -Continueoral iron supplements -Had worsened with recent hospitalization in 11/2018.  -repeated CBC today Hg 10.2, improved   7. Constipation, Recent diarrhea   -likely secondary to pain medication -He only had 1 BMevery2-3 days. He takes Miralax every 1-2 days to avoid diarrhea.  -I encouraged him to take Miralax half dose daily.He can takeMilk of magnesium or magnesium citrate for severe constipation. -Since recent hospitalization for enteritis, he has had diarrhea 3-4 daily with stool incontinence. He is off stool softeners. Should improve when he completes oral Levaquin and Flagyl antibiotics today (12/18/18) -I encouraged him to use imodium 1-2 times a day at first. Will obtain a stool test to rule out C-diff as this was not done during hospitalization. I also encouraged him to drink more water.   8. Anorexia and weight loss, N&V  -Secondary to underlying malignancy -He continues to have weight loss especially with worsened abdominal pain and disease progression.  -His appetiteand weight continues to drop. He has lost 10 pounds in the last month.  -Marinol did not really work for him, so he stopped.  -I encouraged him to follow up with Dietician BaPamala HurryNausea has much improved and mild currently, but still little to no appetite with weight trending down. I advised him to increase carnation breakfast 3-4 times a day and increase calorie and protein intake.  -I will call in megace to help stimulate his appetite. I advised him to stay active as this can lead to blood clots. He is agreeable (12/18/18)   9.Goal of care discussion, reversed DNR/DNI -We again discussed his poor prognosis of his metastatic colon cancer with likely months left given he is starting to decline faster recently.  -The patient understands the goal  of care is palliative with next line regimen of Lonsurf.  -I again  strongly reoccurred him to get Living Will and POA in place. He is agreeable.  -Hepreviously agreed with DNR but reversed it on his recent hospital admission.    Plan -I prescribed megace today. I refilled 32mg Fentanyl. D/c Ambien  -Stop antibiotics today due to diarrhea  -Thoracentesis on 8/10 -Paracentesis on 8/7 -Stool for c-diff today (negative)  -Start Lonsurf on 12/23/18 with 819min the am and 6066mn the pm, M-F 2 weeks on/2 weeks off.  -Lab and f/u in 2 weeks 8/17   No problem-specific Assessment & Plan notes found for this encounter.   Orders Placed This Encounter  Procedures   C difficile quick screen w PCR reflex    Standing Status:   Future    Standing Expiration Date:   12/18/2019   CBC with Differential ([facility])    Standing Status:   Future    Number of Occurrences:   1    Standing Expiration Date:   12/18/2019   CBC with Differential ([facility])    Standing Status:   Standing    Number of Occurrences:   100    Standing Expiration Date:   12/18/2019   Sample to Blood Bank    Standing Status:   Future    Standing Expiration Date:   12/18/2019   All questions were answered. The patient knows to call the clinic with any problems, questions or concerns. No barriers to learning was detected. I spent 25 minutes counseling the patient face to face. The total time spent in the appointment was 30 minutes and more than 50% was on counseling and review of test results     YanTruitt MerleD 12/18/2018   I, AmoJoslyn Devonm acting as scribe for YanTruitt MerleD.   I have reviewed the above documentation for accuracy and completeness, and I agree with the above.

## 2018-12-18 NOTE — Telephone Encounter (Signed)
Scheduled appt per 8/5 los.  Left a voice message of appt date and time.

## 2018-12-19 ENCOUNTER — Other Ambulatory Visit: Payer: BC Managed Care – PPO | Admitting: Internal Medicine

## 2018-12-19 ENCOUNTER — Other Ambulatory Visit: Payer: Self-pay | Admitting: Hematology

## 2018-12-19 ENCOUNTER — Encounter: Payer: Self-pay | Admitting: Hematology

## 2018-12-19 DIAGNOSIS — C787 Secondary malignant neoplasm of liver and intrahepatic bile duct: Secondary | ICD-10-CM

## 2018-12-19 DIAGNOSIS — Z515 Encounter for palliative care: Secondary | ICD-10-CM

## 2018-12-19 DIAGNOSIS — C189 Malignant neoplasm of colon, unspecified: Secondary | ICD-10-CM

## 2018-12-19 NOTE — Progress Notes (Addendum)
Aug 6th, 2020 [facility] Consult Note Telephone: [phone]  Fax: [phone]  PATIENT NAME: Nicholas Lowery DOB: 1961/02/26 MRN: [id number]  [place] Phone: [phone]   PRIMARY CARE PROVIDER:   Curlene Labrum, MD [name] (Oncology; [place])  REFERRING PROVIDER:  Curlene Labrum, MD [address],  [place] [address]  RESPONSIBLE PARTY:  (Spouse) Nicholas Lowery [phone]. E-mail: [email or web address]  ASSESSMENT / RECOMMENDATIONS:  1. Advance Care Planning:  A.  Advance Care Directives: Not addressed this visit.    -F/U future visit.  B. 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 It's a struggle d/t symptom of fatigue/pain/nausea/emotional distress.   -Patient reports he is to start palliative oral chemotherapy (? Lonsurf) on Monday Aug 10th, in the hopes of slowing down cancer growth. I brought up option of hospice services; patient states "I'm not there yet!".  2. Symptom Management: A. Pain: R UQ pressure/pain (from liver metastasis) severe to moderately severe.  Started on Fentanyl patch with gradual dose increase to current 75 mcg/hr, changes patch qod without appreciable improvement in pain relief. Takes Oxycodone 2 tabs of 10mg  (20mg ) q4h with improvement to level moderately severe for about 53min. Better relief if takes 40 mg total of Oxycodone at time, but rarely does this. Pain prevents him from sleeping.  Transient improvement after past thoracentesis and/or paracentesis. He is scheduled for paracentesis tomorrow (Aug 7th) and thoracentesis on Mon (Aug 10th)  -Left message with Dr. Ernestina Penna office; request to discontinue Fentanyl patch (prob inadequate transdermal/adipose absorption/release d/t decreased BMI. Start MS CONT 100 mg bid with continuation of oxycodone 10mg  1-2 tabs q 6 hrs  prn breakthrough pain.   -I believe he has a L chest port-a-cath that could be used for IV pain med in future if necessary. He tells me he has taken Dilaudid in the past which didn't work as well for him, as the oxycodone.  B. Nausea: takes Zofran on regular basis, with prn Phenergan.   C. Constipation alternating with diarrhea: His pattern has been, when feeling constipated, taking three Senokot then experiences massive diarrhea the next day. He has been taking MiraLax qd.  Recent stool sample negative for C-Diff.   -D/C MiraLax. Change Senokot to one tab qd to qod and adjust to maintain qd to qod formed but soft BM.  D. Insomnia d/t pain: mildly improved with sitting up in recliner. Just purchased an adjustable bed so that can raise head. Hoping improvement as pain better controlled.   E. Anxiety of situational stress/pain: Clonazepam 0.5 bid (taking only at hs currently) and Ativan 1 mg prn. Patient is openly conversant regarding stress of illness and grief; depression r/t poor prognosis. Oncologist has shared prognosis of 5 months. Admits stress of pain big contributor to depression. He has a lot of anger d/t delay of diagnosis. Grief that he is leaving his family, and sorrow that their financial situation is precarious.    -Will consult Palliative Care SW to consult for emotional support and supportive counseling.   F. Weight loss of illness: States current weight of 173lbs, which is a loss of 20lbs over the last 7 months. At a height of 5'10" his BMI is 24.8 kg/m2. He appears thinner than this, however, with facial and extremity muscular adipose wasting. Likely contributions of peritoneal and abdominal fluid accumulation. Poor appetite d/t  abdominal pressure. Oral intake 25% of meals or less. Supplements of Carnation Instant Breakfast 2 packs qd to bid.   G. Fatigue d/t illness: has been attempting to walk outside with spouse, notes increased activity intolerance. Notes more of a "duck walk" with  dragging of R foot and rotating out of that leg as well.   H. Expressed wish to decrease pill burden.  -Consider discontinuation of Lasix, Norvasc, and MTV. Discontinue MiraLax in preference to Senokot.   3. Family Supports: Spouse in attendance; emotionally supportive. Teenaged son walking in/out of home during visit.  4. Follow up Palliative Care Visit: F/U TC to home on Monday Aug 10 to assess pain management on new regimen. F/U in 1-2 weeks TC or visit thereafter.   I spent 90 minutes providing this consultation,  from 3pm to 4:30p. More than 50% of the time in this consultation was spent coordinating communication.   HISTORY OF PRESENT ILLNESS:  Patient is a 58 year old male with colon cancer (metastasis to the liver), hypertension, anxiety disorder, recurrent diarrhea as well as chronic pain from cancer. Recent hospital admission 7/29-12/14/2018 where he was treated for hypotension and acute enteritis, possible right-sided colitis (CT scan).  CODE STATUS: Full  PPS: 60% HOSPICE ELIGIBILITY/DIAGNOSIS: Yes, pending completion of palliative chemo  PAST MEDICAL HISTORY:  Past Medical History:  Diagnosis Date  . Anxiety   . Cancer of ascending colon ([place])   . Hypertension   . Seasonal allergies     SOCIAL HX:  Social History   Tobacco Use  . Smoking status: Former Smoker    Years: 2.00    Types: Cigarettes    Quit date: 1990    Years since quitting: 30.6  . Smokeless tobacco: Never Used  Substance Use Topics  . Alcohol use: Yes    Alcohol/week: 20.0 standard drinks    Types: 20 Cans of beer per week    Comment: pt states he drinks about 3-4 beers per night    ALLERGIES:  Allergies  Allergen Reactions  . Penicillins Other (See Comments)    UNSPECIFIED REACTION   Has patient had a PCN reaction causing immediate rash, facial/tongue/throat swelling, SOB or lightheadedness with hypotension: unknown Has patient had a PCN reaction causing severe rash involving mucus  membranes or skin necrosis: unknown Has patient had a PCN reaction that required hospitalization unknown Has patient had a PCN reaction occurring within the last 10 years: No If all of the above answers are "NO", then may proceed with Cephalosporin use.   [name] [Capecitabine] Nausea And Vomiting and Other (See Comments)    Foot pain, patient couldn't walk, and bleeding gums  . Nivolumab Nausea And Vomiting, Palpitations and Other (See Comments)    Infusion reaction: Chills, rigors, numbness. Symptoms improved with ondansetron, clonidine, famotidine, meperidine     PERTINENT MEDICATIONS:  Outpatient Encounter Medications as of 12/19/2018  Medication Sig  . albuterol (VENTOLIN HFA) 108 (90 Base) MCG/ACT inhaler Inhale 1-2 puffs into the lungs every 6 (six) hours as needed for wheezing or shortness of breath. (Patient taking differently: Inhale 1-2 puffs into the lungs every 4 (four) hours as needed for wheezing or shortness of breath. )  . amLODipine (NORVASC) 10 MG tablet Take 1 tablet (10 mg total) by mouth daily.  . clonazePAM (KLONOPIN) 0.5 MG tablet Take 0.5 mg by mouth 2 (two) times daily as needed for anxiety.  . cyclobenzaprine (FLEXERIL) 5 MG tablet Take 1 tablet (5 mg total) by mouth 3 (three) times daily  as needed for muscle spasms.  . fluticasone (FLOVENT HFA) 110 MCG/ACT inhaler Inhale 2 puffs into the lungs 2 (two) times daily. (Patient taking differently: Inhale 2 puffs into the lungs 2 (two) times a day. )  . furosemide (LASIX) 20 MG tablet Take 0.5 tablets (10 mg total) by mouth daily.  Marland Kitchen gabapentin (NEURONTIN) 300 MG capsule TAKE ONE CAPSULE BY MOUTH THREE TIMES DAILY (Patient taking differently: Take 300 mg by mouth 3 (three) times daily as needed (nerve pain). )  . LORazepam (ATIVAN) 1 MG tablet Take 1 tablet (1 mg total) by mouth every 4 (four) hours as needed for anxiety or sleep (nausea).  . megestrol (MEGACE ES) 625 MG/5ML suspension Take 5 mLs (625 mg total) by mouth  daily.  Marland Kitchen morphine (MS CONTIN) 100 MG 12 hr tablet Take 1 tablet (100 mg total) by mouth every 12 (twelve) hours.  . Multiple Vitamin (MULTIVITAMIN WITH MINERALS) TABS tablet Take 1 tablet by mouth daily.  . naloxone (NARCAN) nasal spray 4 mg/0.1 mL Place 1 spray into the nose once as needed (emergency).   . ondansetron (ZOFRAN ODT) 8 MG disintegrating tablet Take 1 tablet (8 mg total) by mouth every 8 (eight) hours as needed for nausea or vomiting.  . Oxycodone HCl 10 MG TABS Take 1-2 tablets (10-20 mg total) by mouth every 6 (six) hours as needed.  . prochlorperazine (COMPAZINE) 10 MG tablet Take 1 tablet (10 mg total) by mouth every 6 (six) hours as needed for nausea or vomiting.  . senna-docusate (SENOKOT-S) 8.6-50 MG tablet Take 2 tablets by mouth 2 (two) times daily. (Patient taking differently: Take 2 tablets by mouth as needed for mild constipation. )  . trifluridine-tipiracil (LONSURF) 20-8.19 MG tablet Take 4 tabs (80mg  trifluridine) PO in AM & 3 tabs (60mg  trifluridine) in PM, immediately after food on days 1-5 & 8-12 of each 28d cycle  . venlafaxine XR (EFFEXOR-XR) 150 MG 24 hr capsule TAKE ONE CAPSULE BY MOUTH DAILY WITH BREAKFAST (Patient taking differently: Take 150 mg by mouth daily with breakfast. )  . zolpidem (AMBIEN CR) 12.5 MG CR tablet Take 1 tablet (12.5 mg total) by mouth at bedtime. (Patient taking differently: Take 12.5 mg by mouth at bedtime as needed. )  . [EXPIRED] levofloxacin (LEVAQUIN) 500 MG tablet Take 1 tablet (500 mg total) by mouth daily for 5 days.  . [EXPIRED] metroNIDAZOLE (FLAGYL) 500 MG tablet Take 1 tablet (500 mg total) by mouth 3 (three) times daily for 5 days.  . [DISCONTINUED] fentaNYL (DURAGESIC) 75 MCG/HR Place 1 patch onto the skin every other day.  . [DISCONTINUED] LORazepam (ATIVAN) 1 MG tablet Take 1 tablet (1 mg total) by mouth every 4 (four) hours as needed for anxiety or sleep (nausea).  . [DISCONTINUED] polyethylene glycol (MIRALAX / GLYCOLAX)  17 g packet Take 17 g by mouth daily as needed for mild constipation.   No facility-administered encounter medications on file as of 12/19/2018.     PHYSICAL EXAM:   VS: BP 118/92, HR 108 General: Thin middle aged male. He is anxious and easily conversant. His wife is in attendance.  Cardiovascular: regular rate and rhythm; tachy Pulmonary: R posterior lung base dullness with egophony. Otherwise clear Abdomen: distended; firm Extremities: no edema, no joint deformities. Muscular and adipose wasting Skin: no rashes Neurological: Weakness but otherwise nonfocal  Julianne Handler, NP

## 2018-12-20 ENCOUNTER — Telehealth: Payer: Self-pay

## 2018-12-20 ENCOUNTER — Ambulatory Visit (HOSPITAL_COMMUNITY)
Admission: RE | Admit: 2018-12-20 | Discharge: 2018-12-20 | Disposition: A | Discharge status: Home / Self Care | Payer: BC Managed Care – PPO | Source: Ambulatory Visit | Attending: Hematology | Admitting: Hematology

## 2018-12-20 ENCOUNTER — Other Ambulatory Visit: Payer: Self-pay

## 2018-12-20 ENCOUNTER — Other Ambulatory Visit: Payer: Self-pay | Admitting: Medical

## 2018-12-20 ENCOUNTER — Other Ambulatory Visit: Payer: Self-pay | Admitting: Hematology

## 2018-12-20 ENCOUNTER — Ambulatory Visit (HOSPITAL_BASED_OUTPATIENT_CLINIC_OR_DEPARTMENT_OTHER): Payer: BC Managed Care – PPO | Admitting: Medical

## 2018-12-20 ENCOUNTER — Encounter: Payer: Self-pay | Admitting: Hematology

## 2018-12-20 DIAGNOSIS — F419 Anxiety disorder, unspecified: Secondary | ICD-10-CM

## 2018-12-20 DIAGNOSIS — Z20828 Contact with and (suspected) exposure to other viral communicable diseases: Secondary | ICD-10-CM | POA: Diagnosis not present

## 2018-12-20 DIAGNOSIS — C189 Malignant neoplasm of colon, unspecified: Secondary | ICD-10-CM | POA: Diagnosis not present

## 2018-12-20 DIAGNOSIS — C787 Secondary malignant neoplasm of liver and intrahepatic bile duct: Secondary | ICD-10-CM

## 2018-12-20 DIAGNOSIS — F329 Major depressive disorder, single episode, unspecified: Secondary | ICD-10-CM

## 2018-12-20 MED ORDER — LORAZEPAM 1 MG PO TABS: 1.0000 mg | ORAL_TABLET | ORAL | 0 refills | Status: DC | PRN

## 2018-12-20 MED ORDER — LORAZEPAM 1 MG PO TABS: 1.0000 mg | ORAL_TABLET | ORAL | 0 refills | Status: AC | PRN

## 2018-12-20 MED ORDER — MORPHINE SULFATE ER 100 MG PO TBCR: 100.0000 mg | EXTENDED_RELEASE_TABLET | Freq: Two times a day (BID) | ORAL | 0 refills | Status: AC

## 2018-12-20 MED ORDER — MORPHINE SULFATE ER 100 MG PO TBCR: 100.0000 mg | EXTENDED_RELEASE_TABLET | Freq: Two times a day (BID) | ORAL | 0 refills | Status: DC

## 2018-12-20 MED FILL — MORPHINE SULF ER 100 MG TAB: 100 | 15 days supply | Qty: 30 | Fill #0

## 2018-12-20 MED FILL — LORazepam 1 MG TABS: 1 | 5 days supply | Qty: 30 | Fill #0

## 2018-12-20 NOTE — Telephone Encounter (Signed)
Nicholas Lowery with Palliative Care calls stating the patient is having inadequate pain control.  She is recommending discontinuing the Fentanyl patch due to his BMI and low fat content.  Replace with MS Contin 100 mg BID and continue Oxycodone 10 mg 1-2 every 4 to 6 hours as need for breakthrough pain.  If [name] agrees could these please be sent into his pharmacy and let the patient know.  Her #[phone]

## 2018-12-20 NOTE — Telephone Encounter (Signed)
Spoke with patient regarding pain medication. Discontinue Fentanyl patch, MS Contin 100 mg take bid. Use Oxycodone for breakthrough. He verbalized an understanding.

## 2018-12-20 NOTE — Progress Notes (Signed)
Patient ID: Nicholas Lowery, male   DOB: 13-Feb-1961, 58 y.o.   MRN: [id number] Pt presented to [place] dept today for paracentesis. On limited US abd in all four quadrants there is small amount of ascites present but not safely accessible for aspiration due to close proximity to bowel loops. Procedure cancelled. [name] and pt made aware. Pt is scheduled for rt thoracentesis on 8/10- would have performed today in light of paracentesis cancellation, however pt's recent COVID -19 test was pending.

## 2018-12-21 LAB — SARS CORONAVIRUS 2 (TAT 6-24 HRS): SARS Coronavirus 2: NEGATIVE

## 2018-12-22 ENCOUNTER — Encounter: Payer: Self-pay | Admitting: Internal Medicine

## 2018-12-23 ENCOUNTER — Other Ambulatory Visit: Payer: Self-pay

## 2018-12-23 ENCOUNTER — Telehealth: Payer: Self-pay

## 2018-12-23 ENCOUNTER — Ambulatory Visit (HOSPITAL_COMMUNITY)
Admission: RE | Admit: 2018-12-23 | Discharge: 2018-12-23 | Disposition: A | Discharge status: Home / Self Care | Payer: BC Managed Care – PPO | Source: Ambulatory Visit | Attending: Radiology | Admitting: Radiology

## 2018-12-23 ENCOUNTER — Encounter: Payer: Self-pay | Admitting: Hematology

## 2018-12-23 ENCOUNTER — Ambulatory Visit (HOSPITAL_COMMUNITY)
Admission: RE | Admit: 2018-12-23 | Discharge: 2018-12-23 | Disposition: A | Discharge status: Home / Self Care | Payer: BC Managed Care – PPO | Source: Ambulatory Visit | Attending: Hematology | Admitting: Hematology

## 2018-12-23 DIAGNOSIS — C189 Malignant neoplasm of colon, unspecified: Secondary | ICD-10-CM | POA: Insufficient documentation

## 2018-12-23 DIAGNOSIS — R0602 Shortness of breath: Secondary | ICD-10-CM | POA: Diagnosis not present

## 2018-12-23 DIAGNOSIS — J90 Pleural effusion, not elsewhere classified: Secondary | ICD-10-CM | POA: Insufficient documentation

## 2018-12-23 DIAGNOSIS — C787 Secondary malignant neoplasm of liver and intrahepatic bile duct: Secondary | ICD-10-CM | POA: Diagnosis not present

## 2018-12-23 DIAGNOSIS — Z9889 Other specified postprocedural states: Secondary | ICD-10-CM

## 2018-12-23 MED ORDER — LIDOCAINE HCL 1 % IJ SOLN
INTRAMUSCULAR | Status: AC
  Filled 2018-12-23: qty 10

## 2018-12-23 NOTE — Procedures (Signed)
Ultrasound-guided  therapeutic right thoracentesis performed yielding 1.8 liters of amber fluid. No immediate complications. Follow-up chest x-ray pending.EBL <1 cc.

## 2018-12-23 NOTE — Progress Notes (Signed)
Nicholas Lowery presented to the front of the cancer clinic and was brought to the clinic room by Dr. Ernestina Penna nurse.  He has been transitioned to palliative care.  Palliative care had recommended changing his pain medication from a fentanyl patch to morphine sulfate 100 mg every 12 hours with continuation of oxycodone for breakthrough pain.  When Nicholas Lowery went to pick up his prescription for morphine sulfate he was told by the pharmacy that they did not have this medication on hand.  He came to our clinic and was very anxious that he would not have this medication.  He was intermittently tearful.  On review he reported that he had stopped taking Effexor which is been prescribed to him since he felt that he was taking too many medications.  We discussed that his anxiety and depression was likely secondary to rebound from abruptly stopping Effexor.  He is agreeable to restart Effexor at 150 mg every 24 hours with an understanding that it could be titrated to 225 mg every 24 hours if needed.  A prescription for MS Contin 100 mg p.o. twice daily x15 days and a prescription for lorazepam were sent to the outpatient pharmacy at [facility].  The pharmacy had been contacted.  They acknowledge that they had these medications on hand and could get the prescription to Nicholas Lowery this afternoon.  His anxiety seem to improve once he knew that he was able to get his medications this afternoon.  Sandi Mealy, MHS, PA-C Physician Assistant

## 2018-12-23 NOTE — Telephone Encounter (Signed)
Left voice message with appointment for paracentesis tomorrow 8/11 at 11:00, arrive at [facility] at 10:45.  Also wanted to check on how he is feeling today.  Reminded him to start Norris today.  Requested he call me back or message through My Chart.

## 2018-12-24 ENCOUNTER — Other Ambulatory Visit: Payer: Self-pay

## 2018-12-24 ENCOUNTER — Other Ambulatory Visit: Payer: Self-pay | Admitting: Hematology

## 2018-12-24 ENCOUNTER — Ambulatory Visit (HOSPITAL_COMMUNITY)
Admission: RE | Admit: 2018-12-24 | Discharge: 2018-12-24 | Disposition: A | Discharge status: Home / Self Care | Payer: BC Managed Care – PPO | Source: Ambulatory Visit | Attending: Medical | Admitting: Medical

## 2018-12-24 ENCOUNTER — Inpatient Hospital Stay (HOSPITAL_BASED_OUTPATIENT_CLINIC_OR_DEPARTMENT_OTHER): Payer: BC Managed Care – PPO | Admitting: Medical

## 2018-12-24 ENCOUNTER — Ambulatory Visit (HOSPITAL_COMMUNITY)
Admission: RE | Admit: 2018-12-24 | Discharge: 2018-12-24 | Disposition: A | Discharge status: Home / Self Care | Payer: BC Managed Care – PPO | Source: Ambulatory Visit | Attending: Hematology | Admitting: Hematology

## 2018-12-24 ENCOUNTER — Telehealth: Payer: Self-pay | Admitting: *Deleted

## 2018-12-24 ENCOUNTER — Inpatient Hospital Stay: Payer: BC Managed Care – PPO

## 2018-12-24 VITALS — BP 135/91 | HR 108 | Temp 98.0°F | Resp 18 | Ht 70.0 in | Wt 167.0 lb

## 2018-12-24 DIAGNOSIS — C787 Secondary malignant neoplasm of liver and intrahepatic bile duct: Secondary | ICD-10-CM

## 2018-12-24 DIAGNOSIS — R112 Nausea with vomiting, unspecified: Secondary | ICD-10-CM

## 2018-12-24 DIAGNOSIS — R140 Abdominal distension (gaseous): Secondary | ICD-10-CM

## 2018-12-24 DIAGNOSIS — C189 Malignant neoplasm of colon, unspecified: Secondary | ICD-10-CM

## 2018-12-24 DIAGNOSIS — C182 Malignant neoplasm of ascending colon: Secondary | ICD-10-CM | POA: Diagnosis not present

## 2018-12-24 DIAGNOSIS — Z95828 Presence of other vascular implants and grafts: Secondary | ICD-10-CM

## 2018-12-24 LAB — CBC WITH DIFFERENTIAL (CANCER CENTER ONLY)
Abs Immature Granulocytes: 0.03 10*3/uL (ref 0.00–0.07)
Basophils Absolute: 0.1 10*3/uL (ref 0.0–0.1)
Basophils Relative: 1 %
Eosinophils Absolute: 0.1 10*3/uL (ref 0.0–0.5)
Eosinophils Relative: 1 %
HCT: 33.9 % — ABNORMAL LOW (ref 39.0–52.0)
Hemoglobin: 10.6 g/dL — ABNORMAL LOW (ref 13.0–17.0)
Immature Granulocytes: 0 %
Lymphocytes Relative: 7 %
Lymphs Abs: 0.6 10*3/uL — ABNORMAL LOW (ref 0.7–4.0)
MCH: 27.2 pg (ref 26.0–34.0)
MCHC: 31.3 g/dL (ref 30.0–36.0)
MCV: 87.1 fL (ref 80.0–100.0)
Monocytes Absolute: 0.9 10*3/uL (ref 0.1–1.0)
Monocytes Relative: 10 %
Neutro Abs: 7.3 10*3/uL (ref 1.7–7.7)
Neutrophils Relative %: 81 %
Platelet Count: 387 10*3/uL (ref 150–400)
RBC: 3.89 MIL/uL — ABNORMAL LOW (ref 4.22–5.81)
RDW: 15.5 % (ref 11.5–15.5)
WBC Count: 9 10*3/uL (ref 4.0–10.5)
nRBC: 0 % (ref 0.0–0.2)

## 2018-12-24 LAB — CMP (CANCER CENTER ONLY)
ALT: 26 U/L (ref 0–44)
AST: 32 U/L (ref 15–41)
Albumin: 3 g/dL — ABNORMAL LOW (ref 3.5–5.0)
Alkaline Phosphatase: 93 U/L (ref 38–126)
Anion gap: 17 — ABNORMAL HIGH (ref 5–15)
BUN: 19 mg/dL (ref 6–20)
CO2: 26 mmol/L (ref 22–32)
Calcium: 10 mg/dL (ref 8.9–10.3)
Chloride: 93 mmol/L — ABNORMAL LOW (ref 98–111)
Creatinine: 1.35 mg/dL — ABNORMAL HIGH (ref 0.61–1.24)
GFR, Est AFR Am: >60 mL/min (ref >60)
GFR, Estimated: 59 mL/min — ABNORMAL LOW (ref >60)
Glucose, Bld: 98 mg/dL (ref 70–99)
Potassium: 4.5 mmol/L (ref 3.5–5.1)
Sodium: 136 mmol/L (ref 135–145)
Total Bilirubin: 0.5 mg/dL (ref 0.3–1.2)
Total Protein: 6.5 g/dL (ref 6.5–8.1)

## 2018-12-24 MED ORDER — SODIUM CHLORIDE 0.9 % IV SOLN
INTRAVENOUS | Status: AC
  Administered 2018-12-24: 14:00:00 via INTRAVENOUS
  Filled 2018-12-24 (×2): qty 250

## 2018-12-24 MED ORDER — SODIUM CHLORIDE 0.9% FLUSH
10.0000 mL | Freq: Once | INTRAVENOUS | Status: AC
  Administered 2018-12-24: 15:00:00 10 mL
  Filled 2018-12-24: qty 10

## 2018-12-24 MED ORDER — LORAZEPAM 2 MG/ML IJ SOLN
0.5000 mg | Freq: Once | INTRAMUSCULAR | Status: AC
  Administered 2018-12-24: 14:00:00 0.5 mg via INTRAVENOUS

## 2018-12-24 MED ORDER — ONDANSETRON HCL 4 MG/2ML IJ SOLN
INTRAMUSCULAR | Status: AC
  Filled 2018-12-24: qty 4

## 2018-12-24 MED ORDER — ONDANSETRON HCL 4 MG/2ML IJ SOLN
8.0000 mg | Freq: Once | INTRAMUSCULAR | Status: AC
  Administered 2018-12-24: 14:00:00 8 mg via INTRAVENOUS

## 2018-12-24 MED ORDER — HEPARIN SOD (PORK) LOCK FLUSH 100 UNIT/ML IV SOLN
500.0000 [IU] | Freq: Once | INTRAVENOUS | Status: AC
  Administered 2018-12-24: 15:00:00 500 [IU]
  Filled 2018-12-24: qty 5

## 2018-12-24 MED ORDER — LORAZEPAM 2 MG/ML IJ SOLN
INTRAMUSCULAR | Status: AC
  Filled 2018-12-24: qty 1

## 2018-12-24 NOTE — Progress Notes (Signed)
[facility] Progress Note   Nicholas Lowery [id number] 05/20/1960 58 y.o.  Nicholas Lowery is managed by Dr. Truitt Merle  Actively treated with chemotherapy/immunotherapy/hormonal therapy: Awaiting start of Lonsurf  Next scheduled appointment with provider: 12/30/2018  Assessment: Plan:    Non-intractable nausea and vomiting with abdominal bloating: The patient was given 500 mils of normal saline over 2 hours.  He additionally was given Zofran 8 mg IV x1 and Ativan 0.5 mg IV x1.  A CBC and a chemistry panel returned stable except for a chloride of 92 and an anion gap of 17.  I discussed with the patient that this was likely related to his vomiting.  He has Zofran at home.  Discussed that he could use sublingual Ativan in between his doses of Zofran.  I encouraged him not to take Ativan around the time that he is taking his MS Contin pain medication.  He was referred for a KUB which returned showing no obstruction.  He will begin using Gas-X per package instructions for his abdominal bloating.  Metastatic colon cancer with liver metastasis: Nicholas Lowery has not yet begun taking Lonsurf due to his nausea and vomiting.  I instructed him to wait 24 to 48 hours to make sure that his nausea and vomiting is better controlled before beginning [place].  He is scheduled to return for follow-up on 12/30/2018.  Please see After Visit Summary for patient specific instructions.  Future Appointments  Date Time Provider [place]  12/30/2018 10:30 AM [facility] 6 [facility] None  12/30/2018 10:45 AM [facility] [place] [facility] None  12/30/2018 11:15 AM Alla Feeling, NP [facility] None    Orders Placed This Encounter  Procedures   DG Abd 2 Views   CBC with Differential ([place])   CMP ([place] only)       Subjective:   Patient ID:  Nicholas Lowery is a 58 y.o. (DOB 1960-10-30) male.  Chief Complaint:  Chief Complaint  Patient  presents with   Abdominal Pain    HPI Nicholas Lowery is a 58 year old male with a history of a metastatic colon cancer with liver metastasis.  He presents to the office today having had nausea and vomiting since yesterday.  He had a thoracentesis completed yesterday and reports having developed nausea and vomiting on his return home.  He is also having abdominal distention and was seen for a possible paracentesis today however it was noted that he had 4 individual pockets of ascites none of which were large enough to safely drain.  He has not yet begun Lonsurf due to his nausea and vomiting.  He is scheduled to return for follow-up on 12/30/2018.  He reports that he has been taking Zofran without complete resolution of his nausea and vomiting.  Medications: I have reviewed the patient's current medications.  Allergies:  Allergies  Allergen Reactions   Penicillins Other (See Comments)    UNSPECIFIED REACTION   Has patient had a PCN reaction causing immediate rash, facial/tongue/throat swelling, SOB or lightheadedness with hypotension: unknown Has patient had a PCN reaction causing severe rash involving mucus membranes or skin necrosis: unknown Has patient had a PCN reaction that required hospitalization unknown Has patient had a PCN reaction occurring within the last 10 years: No If all of the above answers are "NO", then may proceed with Cephalosporin use.    Xeloda [Capecitabine] Nausea And Vomiting and Other (See Comments)    Foot pain, patient couldn't walk, and bleeding gums  Nivolumab Nausea And Vomiting, Palpitations and Other (See Comments)    Infusion reaction: Chills, rigors, numbness. Symptoms improved with ondansetron, clonidine, famotidine, meperidine    Past Medical History:  Diagnosis Date   Anxiety    Cancer of ascending colon ([place])    Hypertension    Seasonal allergies     Past Surgical History:  Procedure Laterality Date   APPENDECTOMY  1992    CATARACT EXTRACTION W/ INTRAOCULAR LENS IMPLANT Left 04/2016   COLON SURGERY     COLONOSCOPY W/ BIOPSIES AND POLYPECTOMY  04/2015   ESOPHAGOGASTRODUODENOSCOPY (EGD) WITH PROPOFOL N/A 11/27/2018   Procedure: ESOPHAGOGASTRODUODENOSCOPY (EGD) WITH PROPOFOL;  Surgeon: Wonda Horner, MD;  Location: [facility];  Service: Endoscopy;  Laterality: N/A;   INGUINAL HERNIA REPAIR Right 1982   IR RADIOLOGIST EVAL & MGMT  01/16/2017   IR THORACENTESIS ASP PLEURAL SPACE W/IMG GUIDE  12/09/2018   KNEE ARTHROSCOPY W/ MENISCECTOMY Right 1998   LAPAROSCOPIC RIGHT COLECTOMY N/A 06/14/2016   Procedure: LAPAROSCOPIC HAND ASSISTED HEMICOLECTOMY AND SMALL BOWEL RESECTION.;  Surgeon: [name], MD;  Location: [facility];  Service: General;  Laterality: N/A;   LIVER BIOPSY Right 06/14/2016   Procedure: LIVER BIOPSY;  Surgeon: [name], MD;  Location: [facility];  Service: General;  Laterality: Right;  Right Inferior Liver   PORTACATH PLACEMENT N/A 07/06/2016   Procedure: INSERTION PORT-A-CATH;  Surgeon: [name], MD;  Location: [facility];  Service: General;  Laterality: N/A;   VASECTOMY  2005    Family History  Problem Relation Age of Onset   Cancer Mother        lung cancer   Stroke Mother    Hypertension Father    CAD Father    Cancer Maternal Grandfather        prostate cancer     Social History   Socioeconomic History   Marital status: Married    Spouse name: Not on file   Number of children: Not on file   Years of education: Not on file   Highest education level: Not on file  Occupational History   Not on file  Social Needs   Financial resource strain: Not on file   Food insecurity    Worry: Not on file    Inability: Not on file   Transportation needs    Medical: Not on file    Non-medical: Not on file  Tobacco Use   Smoking status: Former Smoker    Years: 2.00    Types: Cigarettes    Quit date: 1990    Years since quitting: 30.6   Smokeless  tobacco: Never Used  Substance and Sexual Activity   Alcohol use: Yes    Alcohol/week: 20.0 standard drinks    Types: 20 Cans of beer per week    Comment: pt states he drinks about 3-4 beers per night   Drug use: No    Comment: 06/15/2016 "nothing since college"   Sexual activity: Yes  Lifestyle   Physical activity    Days per week: Not on file    Minutes per session: Not on file   Stress: Not on file  Relationships   Social connections    Talks on phone: Not on file    Gets together: Not on file    Attends religious service: Not on file    Active member of club or organization: Not on file    Attends meetings of clubs or organizations: Not on file    Relationship status:  Not on file   Intimate partner violence    Fear of current or ex partner: Not on file    Emotionally abused: Not on file    Physically abused: Not on file    Forced sexual activity: Not on file  Other Topics Concern   Not on file  Social History Narrative   Not on file    Past Medical History, Surgical history, Social history, and Family history were reviewed and updated as appropriate.   Please see review of systems for further details on the patient's review from today.   Review of Systems:  Review of Systems  Constitutional: Positive for appetite change. Negative for chills, diaphoresis and fever.  HENT: Negative for trouble swallowing.   Respiratory: Negative for cough, choking, shortness of breath and wheezing.   Cardiovascular: Negative for chest pain and palpitations.  Gastrointestinal: Positive for abdominal distention, diarrhea, nausea and vomiting. Negative for constipation.  Genitourinary: Negative for decreased urine volume.  Neurological: Negative for headaches.    Objective:   Physical Exam:  BP (!) 135/91    Pulse (!) 108    Temp 98 F (36.7 C) (Temporal)    Resp 18    Ht 5\' 10"  (1.778 m)    Wt 167 lb (75.8 kg)    SpO2 98%    BMI 23.96 kg/m  ECOG: 2  Physical  Exam Constitutional:      General: He is not in acute distress.    Appearance: He is not toxic-appearing or diaphoretic.     Comments: The patient is an adult chronically ill-appearing male who appears to be in no acute distress.  HENT:     Head: Normocephalic and atraumatic.  Cardiovascular:     Rate and Rhythm: Normal rate and regular rhythm.     Heart sounds: Normal heart sounds. No murmur. No friction rub. No gallop.   Pulmonary:     Effort: Pulmonary effort is normal. No respiratory distress.     Breath sounds: Normal breath sounds. No wheezing or rales.  Abdominal:     General: Bowel sounds are normal. There is distension.  Skin:    General: Skin is warm and dry.     Findings: No erythema or rash.  Neurological:     Mental Status: He is alert.     Lab Review:     Component Value Date/Time   NA 136 12/24/2018 1326   NA 139 05/17/2017 0931   K 4.5 12/24/2018 1326   K 4.9 05/17/2017 0931   CL 93 (L) 12/24/2018 1326   CO2 26 12/24/2018 1326   CO2 27 05/17/2017 0931   GLUCOSE 98 12/24/2018 1326   GLUCOSE 86 05/17/2017 0931   BUN 19 12/24/2018 1326   BUN 16.8 05/17/2017 0931   CREATININE 1.35 (H) 12/24/2018 1326   CREATININE 1.1 05/17/2017 0931   CALCIUM 10.0 12/24/2018 1326   CALCIUM 9.8 05/17/2017 0931   PROT 6.5 12/24/2018 1326   PROT 7.0 05/17/2017 0931   ALBUMIN 3.0 (L) 12/24/2018 1326   ALBUMIN 4.1 05/17/2017 0931   AST 32 12/24/2018 1326   AST 17 05/17/2017 0931   ALT 26 12/24/2018 1326   ALT 16 05/17/2017 0931   ALKPHOS 93 12/24/2018 1326   ALKPHOS 44 05/17/2017 0931   BILITOT 0.5 12/24/2018 1326   BILITOT 0.57 05/17/2017 0931   GFRNONAA 59 (L) 12/24/2018 1326   GFRAA >60 12/24/2018 1326       Component Value Date/Time   WBC 9.0 12/24/2018 1326  WBC 4.0 12/14/2018 0402   RBC 3.89 (L) 12/24/2018 1326   HGB 10.6 (L) 12/24/2018 1326   HGB 13.4 05/17/2017 0931   HCT 33.9 (L) 12/24/2018 1326   HCT 40.5 05/17/2017 0931   PLT 387 12/24/2018 1326    PLT 119 (L) 05/17/2017 0931   MCV 87.1 12/24/2018 1326   MCV 96.9 05/17/2017 0931   MCH 27.2 12/24/2018 1326   MCHC 31.3 12/24/2018 1326   RDW 15.5 12/24/2018 1326   RDW 15.3 (H) 05/17/2017 0931   LYMPHSABS 0.6 (L) 12/24/2018 1326   LYMPHSABS 0.7 (L) 05/17/2017 0931   MONOABS 0.9 12/24/2018 1326   MONOABS 0.5 05/17/2017 0931   EOSABS 0.1 12/24/2018 1326   EOSABS 0.1 05/17/2017 0931   BASOSABS 0.1 12/24/2018 1326   BASOSABS 0.0 05/17/2017 0931   -------------------------------  Imaging from last 24 hours (if applicable):  Radiology interpretation: Dg Chest 1 View  Result Date: 12/23/2018 CLINICAL DATA:  Status post thoracentesis EXAM: CHEST  1 VIEW COMPARISON:  December 14, 2018 FINDINGS: No appreciable pneumothorax. There is a small residual right pleural effusion with atelectasis and patchy airspace opacity in the right base. Left lung is clear. Heart size and pulmonary vascularity are normal. No adenopathy. Port-A-Cath tip is in the superior vena cava. No bone lesions. IMPRESSION: No pneumothorax. Small right pleural effusion with atelectasis and patchy opacity likely representing pneumonia in the right base. Left lung clear. Stable cardiac silhouette. Electronically Signed   By: Lowella Grip III M.D.   On: 12/23/2018 16:57   Dg Chest 1 View  Result Date: 12/09/2018 CLINICAL DATA:  Post rt lung thora, in IR, pt waiting, feels better, no quite so sob mk rt-r EXAM: CHEST  1 VIEW COMPARISON:  12/05/2018 FINDINGS: No pneumothorax. Small residual right pleural effusion. Improved aeration at the right lung base with some residual interstitial and airspace opacities medially. Left lung clear. Heart size and mediastinal contours are within normal limits. Left subclavian port catheter to the distal SVC as before. Visualized bones unremarkable. IMPRESSION: No pneumothorax post right thoracentesis. Electronically Signed   By: Lucrezia Europe M.D.   On: 12/09/2018 15:31   Dg Chest 1 View  Result  Date: 12/05/2018 CLINICAL DATA:  Evaluate right pleural effusion. Metastatic colon carcinoma. EXAM: CHEST  1 VIEW COMPARISON:  11/25/2018 FINDINGS: By pleural effusion has increased, moderate size, now obscuring the right hemidiaphragm. There is opacity at the right lung base consistent with atelectasis. Remainder of the lungs is clear. No left pleural effusion. Cardiac silhouette is normal in size. No mediastinal or hilar masses. Left anterior chest wall Port-A-Cath is stable. Skeletal structures are grossly intact. IMPRESSION: 1. Increased size of the right pleural effusion, now moderate and obscuring the right hemidiaphragm. There is associated right lung base opacity most likely atelectasis. 2. Remainder of the lungs is clear.  No left pleural effusion. Electronically Signed   By: Lajean Manes M.D.   On: 12/05/2018 14:40   Dg Chest 1 View  Result Date: 11/25/2018 CLINICAL DATA:  Status post RIGHT thoracentesis. EXAM: CHEST  1 VIEW COMPARISON:  None. FINDINGS: The RIGHT pleural effusion has decreased in size. Mild RIGHT basilar atelectasis persists. There is no evidence of pneumothorax. A LEFT Port-A-Cath is again noted. The LEFT lung is clear. IMPRESSION: Decreased RIGHT pleural effusion with mild persistent RIGHT basilar atelectasis. No pneumothorax. Electronically Signed   By: Margarette Canada M.D.   On: 11/25/2018 14:07   Ct Angio Chest Pe W Or Wo Contrast  Result  Date: 11/24/2018 CLINICAL DATA:  History of metastatic colon carcinoma with fever and abdominal pain EXAM: CT ANGIOGRAPHY CHEST CT ABDOMEN AND PELVIS WITH CONTRAST TECHNIQUE: Multidetector CT imaging of the chest was performed using the standard protocol during bolus administration of intravenous contrast. Multiplanar CT image reconstructions and MIPs were obtained to evaluate the vascular anatomy. Multidetector CT imaging of the abdomen and pelvis was performed using the standard protocol during bolus administration of intravenous contrast.  CONTRAST:  58mL OMNIPAQUE IOHEXOL 350 MG/ML SOLN COMPARISON:  Similar exam dated 11/13/2018 FINDINGS: CTA CHEST FINDINGS Cardiovascular: Thoracic aorta demonstrates a normal branching pattern. No aneurysmal dilatation or dissection is seen. The heart is normal in size and configuration. The pulmonary artery shows a normal branching pattern. No definitive filling defect to suggest pulmonary embolism is noted. Mediastinum/Nodes: Thoracic inlet is within normal limits. The esophagus is within normal limits. Scattered mediastinal lymph nodes are again identified similar to that seen on the recent restaging examination from 11 days previous. Stable right hilar lymph node is noted as well. Lungs/Pleura: The lungs are well aerated bilaterally. Nodules are again identified bilaterally. The overall appearance is stable from the prior exam from 11 days previous. An enlarging right-sided pleural effusion is seen. New superimposed right lower lobe and right middle lobe infiltrate is seen. Musculoskeletal: Degenerative changes of the thoracic spine are noted. No metastatic lesions are seen. Left-sided chest wall port is noted Review of the MIP images confirms the above findings. CT ABDOMEN and PELVIS FINDINGS Hepatobiliary: Liver again demonstrates a hypodense lesion within the right lobe consistent with metastatic disease measuring 4.2 cm in greatest dimension. Small hypodensity in the dome of the liver is noted also stable from the prior study. Gallbladder demonstrates vicarious excretion of contrast. No ductal dilatation is seen. Pancreas: Unremarkable. No pancreatic ductal dilatation or surrounding inflammatory changes. Spleen: Normal in size without focal abnormality. Adrenals/Urinary Tract: Adrenal glands are within normal limits. Kidneys demonstrate no renal calculi or obstructive changes bilaterally. Normal excretion of contrast is seen. The bladder is decompressed. Stomach/Bowel: Colon is well visualized. Postsurgical  changes are noted in the mid transverse colon consistent with the prior surgical history. There is again noted soft tissue mass in the mesentery adjacent to the anastomotic site best seen in the coronal plane on image number 40 of series 4. Just proximal to the small bowel colon anastomotic site there is some mild dilatation of the distal small bowel identified. Multiple edematous loops of small bowel are noted proximal to the anastomotic site. A mild partial small bowel obstruction at the anastomotic site cannot be totally excluded. Vascular/Lymphatic: No aneurysmal dilatation is seen. The encasement of the superior mesenteric artery and vein by the adjacent mesenteric mass is less well visualized on today's exam due to the timing of the contrast bolus. There does remain however venous engorgement of the small bowel contributing to the degree of small bowel wall edema and likely ascites. Reproductive: Prostate is unremarkable. Other: Increase in ascites is noted both in the pelvis as well as surrounding the bowel loops and liver. Peritoneal implant is again noted near the umbilicus. Musculoskeletal: Degenerative changes of lumbar spine are seen. Review of the MIP images confirms the above findings. IMPRESSION: CTA of the chest: No evidence of pulmonary emboli are identified. Stable right hilar and mediastinal adenopathy is seen. Stable metastatic lesions within both lungs. Increasing right-sided pleural effusion with associated right lower and middle lobe infiltrates CT of the abdomen and pelvis: Postsurgical changes consistent with the given  clinical history. There remains a mesenteric mass near the anastomotic site similar to that seen on the recent exam. Stable appearing hepatic metastatic disease is noted. Persistent venous engorgement is noted related to vascular encasement with new small bowel edematous changes identified. Some mild dilatation of the distal small bowel proximal to the anastomosis is seen. The  possibility of mild narrowing at the anastomosis deserves consideration. This was not present on the most recent exam. This may be accentuated by the venous engorgement and small-bowel wall edema. Increasing ascites. There are changes consistent with peritoneal implantation stable from the prior exam. Electronically Signed   By: Inez Catalina M.D.   On: 11/24/2018 18:13   Ct Abdomen Pelvis W Contrast  Result Date: 12/11/2018 CLINICAL DATA:  Diarrhea EXAM: CT ABDOMEN AND PELVIS WITH CONTRAST TECHNIQUE: Multidetector CT imaging of the abdomen and pelvis was performed using the standard protocol following bolus administration of intravenous contrast. CONTRAST:  150mL OMNIPAQUE IOHEXOL 300 MG/ML  SOLN COMPARISON:  CT 11/24/2018, 08/29/2018 FINDINGS: Lower chest: Lung bases demonstrate moderate right-sided pleural effusion. Innumerable metastatic pulmonary nodules are again visualized. Peribronchial thickening at the right middle lobe and right lung base. Partial atelectasis or pneumonia at the right base. Hepatobiliary: Multiple hypodense liver nodules consistent with metastatic disease. Index lesion in the inferior right hepatic lobe measures 5.5 cm, compared with 4.3 cm previously. Increased conspicuity of other metastatic nodules in the liver. No calcified gallstone or biliary dilatation Pancreas: Unremarkable. No pancreatic ductal dilatation or surrounding inflammatory changes. Spleen: Normal in size without focal abnormality. Adrenals/Urinary Tract: Adrenal glands are normal. Kidneys show no hydronephrosis. The bladder is unremarkable Stomach/Bowel: The stomach is nonenlarged. Abnormal segmental small bowel thickening in the right hemiabdomen. No intramural air. Status post partial colectomy Vascular/Lymphatic: Nonaneurysmal aorta. Stable subcentimeter retroperitoneal nodes. Increased intraperitoneal nodules near the umbilical region, measuring up to 14 mm. Reproductive: Slightly enlarged prostate with  calcification Other: No free air. Moderate to large volume of ascites in the abdomen and pelvis, increased compared to prior. Ill-defined soft tissue density within the central mesentery adjacent to surgical anastomosis. This appears to encase the distal SMA and SMV. Musculoskeletal: No acute or suspicious osseous abnormality. IMPRESSION: 1. Segmental small bowel wall thickening within the right hemiabdomen which may be secondary to infection or ischemia, less likely inflammatory bowel disease. Bowel wall thickening appears worse when compared to the most recent CT from November 24, 2018. The segment of thickened small bowel is also fluid filled and there may be a degree of obstruction, possibly related to surgical anastomosis. 2. Moderate right pleural effusion. Numerous metastatic pulmonary nodules as before. 3. Increased size of inferior right hepatic lobe metastatic lesion with increased consult conspicuity of additional hypodense liver lesions, consistent with progression of metastatic disease. 4. Ill-defined mesenteric soft tissue mass near the surgical anastomosis, with encasement of the distal SMA and SMV vessels 5. Moderate to large volume of ascites within the abdomen and pelvis, may be slightly increased. Increased peritoneal nodularity near the umbilicus Electronically Signed   By: Donavan Foil M.D.   On: 12/11/2018 22:25   Ct Abdomen Pelvis W Contrast  Result Date: 11/24/2018 CLINICAL DATA:  History of metastatic colon carcinoma with fever and abdominal pain EXAM: CT ANGIOGRAPHY CHEST CT ABDOMEN AND PELVIS WITH CONTRAST TECHNIQUE: Multidetector CT imaging of the chest was performed using the standard protocol during bolus administration of intravenous contrast. Multiplanar CT image reconstructions and MIPs were obtained to evaluate the vascular anatomy. Multidetector CT imaging of the abdomen  and pelvis was performed using the standard protocol during bolus administration of intravenous contrast.  CONTRAST:  42mL OMNIPAQUE IOHEXOL 350 MG/ML SOLN COMPARISON:  Similar exam dated 11/13/2018 FINDINGS: CTA CHEST FINDINGS Cardiovascular: Thoracic aorta demonstrates a normal branching pattern. No aneurysmal dilatation or dissection is seen. The heart is normal in size and configuration. The pulmonary artery shows a normal branching pattern. No definitive filling defect to suggest pulmonary embolism is noted. Mediastinum/Nodes: Thoracic inlet is within normal limits. The esophagus is within normal limits. Scattered mediastinal lymph nodes are again identified similar to that seen on the recent restaging examination from 11 days previous. Stable right hilar lymph node is noted as well. Lungs/Pleura: The lungs are well aerated bilaterally. Nodules are again identified bilaterally. The overall appearance is stable from the prior exam from 11 days previous. An enlarging right-sided pleural effusion is seen. New superimposed right lower lobe and right middle lobe infiltrate is seen. Musculoskeletal: Degenerative changes of the thoracic spine are noted. No metastatic lesions are seen. Left-sided chest wall port is noted Review of the MIP images confirms the above findings. CT ABDOMEN and PELVIS FINDINGS Hepatobiliary: Liver again demonstrates a hypodense lesion within the right lobe consistent with metastatic disease measuring 4.2 cm in greatest dimension. Small hypodensity in the dome of the liver is noted also stable from the prior study. Gallbladder demonstrates vicarious excretion of contrast. No ductal dilatation is seen. Pancreas: Unremarkable. No pancreatic ductal dilatation or surrounding inflammatory changes. Spleen: Normal in size without focal abnormality. Adrenals/Urinary Tract: Adrenal glands are within normal limits. Kidneys demonstrate no renal calculi or obstructive changes bilaterally. Normal excretion of contrast is seen. The bladder is decompressed. Stomach/Bowel: Colon is well visualized. Postsurgical  changes are noted in the mid transverse colon consistent with the prior surgical history. There is again noted soft tissue mass in the mesentery adjacent to the anastomotic site best seen in the coronal plane on image number 40 of series 4. Just proximal to the small bowel colon anastomotic site there is some mild dilatation of the distal small bowel identified. Multiple edematous loops of small bowel are noted proximal to the anastomotic site. A mild partial small bowel obstruction at the anastomotic site cannot be totally excluded. Vascular/Lymphatic: No aneurysmal dilatation is seen. The encasement of the superior mesenteric artery and vein by the adjacent mesenteric mass is less well visualized on today's exam due to the timing of the contrast bolus. There does remain however venous engorgement of the small bowel contributing to the degree of small bowel wall edema and likely ascites. Reproductive: Prostate is unremarkable. Other: Increase in ascites is noted both in the pelvis as well as surrounding the bowel loops and liver. Peritoneal implant is again noted near the umbilicus. Musculoskeletal: Degenerative changes of lumbar spine are seen. Review of the MIP images confirms the above findings. IMPRESSION: CTA of the chest: No evidence of pulmonary emboli are identified. Stable right hilar and mediastinal adenopathy is seen. Stable metastatic lesions within both lungs. Increasing right-sided pleural effusion with associated right lower and middle lobe infiltrates CT of the abdomen and pelvis: Postsurgical changes consistent with the given clinical history. There remains a mesenteric mass near the anastomotic site similar to that seen on the recent exam. Stable appearing hepatic metastatic disease is noted. Persistent venous engorgement is noted related to vascular encasement with new small bowel edematous changes identified. Some mild dilatation of the distal small bowel proximal to the anastomosis is seen. The  possibility of mild narrowing at  the anastomosis deserves consideration. This was not present on the most recent exam. This may be accentuated by the venous engorgement and small-bowel wall edema. Increasing ascites. There are changes consistent with peritoneal implantation stable from the prior exam. Electronically Signed   By: Inez Catalina M.D.   On: 11/24/2018 18:13   US Abdomen Limited  Result Date: 12/02/2018 CLINICAL DATA:  Metastatic colon cancer to the liver. Follow-up ascites. EXAM: LIMITED ABDOMEN ULTRASOUND FOR ASCITES TECHNIQUE: Limited ultrasound survey for ascites was performed in all four abdominal quadrants. COMPARISON:  Abdomen and pelvis CT dated 11/24/2018. FINDINGS: Moderate amount of free peritoneal fluid, increased since 11/24/2018. IMPRESSION: Moderate amount of ascites, increased. Electronically Signed   By: Claudie Revering M.D.   On: 12/02/2018 09:18   US Paracentesis  Result Date: 12/13/2018 INDICATION: Patient with history metastatic colon cancer, enteritis, right pleural effusion, ascites. Request made for diagnostic and therapeutic paracentesis. EXAM: ULTRASOUND GUIDED DIAGNOSTIC AND THERAPEUTIC PARACENTESIS MEDICATIONS: None COMPLICATIONS: None immediate. PROCEDURE: Informed written consent was obtained from the patient after a discussion of the risks, benefits and alternatives to treatment. A timeout was performed prior to the initiation of the procedure. Initial ultrasound scanning demonstrates a small to moderate amount of ascites within the left mid lower abdominal quadrant. The left mid to lower abdomen was prepped and draped in the usual sterile fashion. 1% lidocaine was used for local anesthesia. Following this, a 19 gauge, 7-cm, Yueh catheter was introduced. An ultrasound image was saved for documentation purposes. The paracentesis was performed. The catheter was removed and a dressing was applied. The patient tolerated the procedure well without immediate post procedural  complication. FINDINGS: A total of approximately 1.7 liters of yellow fluid was removed. Samples were sent to the laboratory as requested by the clinical team. IMPRESSION: Successful ultrasound-guided diagnostic and therapeutic paracentesis yielding 1.7 liters of peritoneal fluid. Read by: Rowe Robert, PA-C Electronically Signed   By: Markus Daft M.D.   On: 12/13/2018 17:19   Dg Chest Port 1 View  Result Date: 12/14/2018 CLINICAL DATA:  S/p RT THORA H/o HTN and Colon Ca. EXAM: PORTABLE CHEST 1 VIEW COMPARISON:  Radiograph 12/09/2018 FINDINGS: Port in the anterior chest wall with tip in distal SVC. Small RIGHT effusion noted. No RIGHT pneumothorax. LEFT lung clear. IMPRESSION: No pneumothorax following RIGHT thoracentesis. Electronically Signed   By: Suzy Bouchard M.D.   On: 12/14/2018 15:09   Dg Abd 2 Views  Result Date: 12/24/2018 CLINICAL DATA:  Abdominal distension, nausea, vomiting EXAM: ABDOMEN - 2 VIEW COMPARISON:  08/21/2018, 12/11/2018 FINDINGS: Nonspecific bowel gas pattern without evidence of obstruction. No free intraperitoneal air. No suspicious intra-abdominal calcification. Streaky bibasilar opacities within the visualized lung bases, as seen on chest x-ray 12/23/2018. No acute osseous findings. IMPRESSION: Nonspecific bowel gas pattern. Electronically Signed   By: Davina Poke M.D.   On: 12/24/2018 13:14   [place] Ascites (abdomen Limited)  Result Date: 12/21/2018 CLINICAL DATA:  58 year old male with metastatic colon cancer. Evaluate for ascites. EXAM: LIMITED ABDOMEN ULTRASOUND FOR ASCITES TECHNIQUE: Limited ultrasound survey for ascites was performed in all four abdominal quadrants. COMPARISON:  Ultrasound dated 12/13/2018 and CT of the abdomen pelvis dated 12/11/2018 FINDINGS: There is a small ascites. Partially visualized moderate right-sided pleural effusion. IMPRESSION: Small ascites and moderate right pleural effusion. Electronically Signed   By: Anner Crete M.D.   On:  12/21/2018 02:37   [place] Ascites (abdomen Limited)  Result Date: 12/02/2018 CLINICAL DATA:  58 year old male with colon cancer metastatic  to the liver. EXAM: LIMITED ABDOMEN ULTRASOUND FOR ASCITES TECHNIQUE: Limited ultrasound survey for ascites was performed in all four abdominal quadrants. COMPARISON:  CT scan of the abdomen and pelvis 11/24/2018 FINDINGS: Sonographic interrogation of the 4 quadrants of the abdomen demonstrates small volume intraperitoneal ascites. There is a moderately large right-sided pleural effusion. Fluid was deemed too scant for paracentesis. IMPRESSION: 1. Small volume ascites.  Paracentesis was deferred. 2. Moderate right pleural effusion. Electronically Signed   By: Jacqulynn Cadet M.D.   On: 12/02/2018 16:20   Ir Thoracentesis Asp Pleural Space W/img Guide  Result Date: 12/09/2018 INDICATION: Patient with history of metastatic colon cancer, recurrent right-sided pleural effusion. Request to IR therapeutic right-sided thoracentesis. EXAM: ULTRASOUND GUIDED right THORACENTESIS MEDICATIONS: 10 mL 1% lidocaine COMPLICATIONS: None immediate. PROCEDURE: An ultrasound guided thoracentesis was thoroughly discussed with the patient and questions answered. The benefits, risks, alternatives and complications were also discussed. The patient understands and wishes to proceed with the procedure. Written consent was obtained. Ultrasound was performed to localize and mark an adequate pocket of fluid in the right chest. The area was then prepped and draped in the normal sterile fashion. 1% Lidocaine was used for local anesthesia. Under ultrasound guidance a 6 Fr Safe-T-Centesis catheter was introduced. Thoracentesis was performed. The catheter was removed and a dressing applied. FINDINGS: A total of approximately 2.1 L of clear yellow fluid was removed. IMPRESSION: Successful ultrasound guided right thoracentesis yielding 2.1 L of pleural fluid. Read by Candiss Norse, PA-C Electronically  Signed   By: Markus Daft M.D.   On: 12/09/2018 15:01   US Thoracentesis Asp Pleural Space W/img Guide  Result Date: 12/23/2018 INDICATION: Patient with history of metastatic colon cancer, dyspnea, recurrent malignant right pleural effusion. Request made for therapeutic right thoracentesis. EXAM: ULTRASOUND GUIDED THERAPEUTIC RIGHT THORACENTESIS MEDICATIONS: None COMPLICATIONS: None immediate. PROCEDURE: An ultrasound guided thoracentesis was thoroughly discussed with the patient and questions answered. The benefits, risks, alternatives and complications were also discussed. The patient understands and wishes to proceed with the procedure. Written consent was obtained. Ultrasound was performed to localize and mark an adequate pocket of fluid in the right chest. The area was then prepped and draped in the normal sterile fashion. 1% Lidocaine was used for local anesthesia. Under ultrasound guidance a 6 Fr Safe-T-Centesis catheter was introduced. Thoracentesis was performed. The catheter was removed and a dressing applied. FINDINGS: A total of approximately 1.8 liters of amber fluid was removed. IMPRESSION: Successful ultrasound guided therapeutic right thoracentesis yielding 1.8 liters of pleural fluid. Read by: Rowe Robert, PA-C Electronically Signed   By: Jacqulynn Cadet M.D.   On: 12/23/2018 17:36   US Thoracentesis Asp Pleural Space W/img Guide  Result Date: 12/14/2018 INDICATION: Right pleural effusion, colon cancer EXAM: ULTRASOUND GUIDED RIGHT THORACENTESIS MEDICATIONS: 1% lidocaine local COMPLICATIONS: None immediate. PROCEDURE: An ultrasound guided thoracentesis was thoroughly discussed with the patient and questions answered. The benefits, risks, alternatives and complications were also discussed. The patient understands and wishes to proceed with the procedure. Written consent was obtained. Ultrasound was performed to localize and mark an adequate pocket of fluid in the right chest. The area was  then prepped and draped in the normal sterile fashion. 1% Lidocaine was used for local anesthesia. Under ultrasound guidance a 6 Fr Safe-T-Centesis catheter was introduced. Thoracentesis was performed. The catheter was removed and a dressing applied. FINDINGS: A total of approximately 1.4 L of clear pleural fluid was removed. Sample was not sent for laboratory analysis IMPRESSION: Successful  ultrasound guided right thoracentesis yielding 1.4 L of pleural fluid. Electronically Signed   By: Jerilynn Mages.  Shick M.D.   On: 12/14/2018 14:52   US Thoracentesis Asp Pleural Space W/img Guide  Result Date: 11/25/2018 INDICATION: Patient with history of shortness of breath, pneumonia, right pleural effusion. Request made for diagnostic and therapeutic thoracentesis. EXAM: ULTRASOUND GUIDED RIGHT DIAGNOSTIC AND THERAPEUTIC THORACENTESIS MEDICATIONS: 10 mL 1% lidocaine COMPLICATIONS: None immediate. PROCEDURE: An ultrasound guided thoracentesis was thoroughly discussed with the patient and questions answered. The benefits, risks, alternatives and complications were also discussed. The patient understands and wishes to proceed with the procedure. Written consent was obtained. Ultrasound was performed to localize and mark an adequate pocket of fluid in the right chest. The area was then prepped and draped in the normal sterile fashion. 1% Lidocaine was used for local anesthesia. Under ultrasound guidance a 6 Fr Safe-T-Centesis catheter was introduced. Thoracentesis was performed. The catheter was removed and a dressing applied. FINDINGS: A total of approximately 1.3 liters of clear, yellow fluid was removed. Samples were sent to the laboratory as requested by the clinical team. IMPRESSION: Successful ultrasound guided diagnostic and therapeutic right thoracentesis yielding 1.3 liters of pleural fluid. Read by: Brynda Greathouse PA-C Electronically Signed   By: Markus Daft M.D.   On: 11/25/2018 14:33

## 2018-12-24 NOTE — Progress Notes (Signed)
Patient ID: Nicholas Lowery, male   DOB: 02/08/1961, 58 y.o.   MRN: [id number] Pt presented to [place] dept today for therapeutic paracentesis. On limited US abd in all four quadrants there is small amount of ascites noted, none of which is safely accessible for aspiration due to close proximity of bowel loops to abd surface. Pt was repositioned on each side to assess safe access but none available. Procedure cancelled. Pt aware. He has f/u appt today at [facility].

## 2018-12-24 NOTE — Patient Instructions (Signed)

## 2018-12-24 NOTE — Telephone Encounter (Signed)
Received call from pt stating that he is not feeling well.  He states that he went for thoracentesis yest & vomited large amt of green/gray liquid afterward.  He states he has been unable to drink & feels shaky & just feels bad.  Discussed with Sandi Mealy PA & suggested pt keep appt for Paracentesis today & then see him afterward.  Returned call to pt but didn't reach him until @ 10:20 am.  Pt reports that he is scared to have fluid drawn off since he vomited so much yest.  Explained that if there is fluid in his abdomen, that would be removed & the vomit was from his stomach.  He is agreeable to go for paracentesis & will come over afterward to see Lucianne Lei.  Message routed to Symptom Management.

## 2018-12-24 NOTE — Progress Notes (Signed)
Pt received 500 ml NS IVF with antiemetics.  Tolerated well.  VSS.  Reports feeling a bit better at end of tx.  Ate and drank during infusion without any issues.

## 2018-12-25 ENCOUNTER — Telehealth: Payer: Self-pay

## 2018-12-25 ENCOUNTER — Encounter: Payer: Self-pay | Admitting: Medical

## 2018-12-25 NOTE — Telephone Encounter (Signed)
Faxed refill for Lonsurf to [facility] [phone], sent to HIM for scan to chart.

## 2018-12-26 ENCOUNTER — Other Ambulatory Visit: Payer: Self-pay

## 2018-12-26 ENCOUNTER — Encounter: Payer: Self-pay | Admitting: Medical

## 2018-12-26 ENCOUNTER — Other Ambulatory Visit: Payer: BC Managed Care – PPO | Admitting: Internal Medicine

## 2018-12-26 ENCOUNTER — Telehealth: Payer: Self-pay

## 2018-12-26 ENCOUNTER — Encounter: Payer: Self-pay | Admitting: Internal Medicine

## 2018-12-26 DIAGNOSIS — Z515 Encounter for palliative care: Secondary | ICD-10-CM

## 2018-12-26 NOTE — Progress Notes (Signed)
Aug 13th, 2020 [facility] Consult Note Telephone: [phone]  Fax: [phone]  Due to the current COVID-19 infection/crises, the family prefer, and have given their verbal consent for, a provider visit via telemedicine. HIPPA policies of confidentially were discussed. Video-audio (telehealth) contact was unable to be done due technical barriers from the patients side.  PATIENT NAME: Dixie Jafri DOB: Apr 30, 1961 MRN: [id number]  [place] Phone: [phone]   PRIMARY CARE PROVIDER:   Curlene Labrum, MD [name] (Oncology; [place])  REFERRING PROVIDER:  Curlene Labrum, MD [address] [place],  [place] [address]  RESPONSIBLE PARTY:  (Spouse) Md Smola [phone]. E-mail: [email or web address]  ASSESSMENT / RECOMMENDATIONS:  1. Advance Care Planning:             A.  Advance Care Directives: Not addressed this visit.                          -F/U future visit.             B. Goals of Care: 9am TC from PCG spouse Cathy. Progressive decline over the last 4 days, with no oral consumption of solids, decreased oral fluids and emesis of fluids taken. He has Zofran available. Increased weakness and unsteadiness of gait, as well as increased somnolence.  ER visit for dehydration earlier this week treated with IVFs. Intention for initiation of oral palliative chemo Monday Aug 10th was deferred. Tye Maryland mentioned the option of Hospice to her husband, and patient is now considering. I outlined the scope of hospice care to [facility]. She is interested in this option if patient agreeable. I offered PC visit this evening for further discussion, but the family has a bit going on (patients co-workers coming by to visit, patients son having virtual visit to prepare for school, etc). We decided that Tye Maryland will discuss hospice option with patient again this evening. If he decides to enroll in those  services, Tye Maryland will send me a text and I will complete the referral. Tye Maryland mentions patients PCP Dr. Bradly Bienenstock may be agreeable to act as attending under hospice. If not, then to resource [name]. Patients birthday is [birth year] 3[birth year], but Tye Maryland is worried not that he may not be alive to celebrate.   3. Family Supports: Spouse Tye Maryland and teenaged son of the home  4. Follow up Palliative Care: Await text/TC from spouse Tye Maryland; home PC visit 12/27/2018 and/or initiate hospice referral  I spent 30 minutes providing this consultation,  from 9am to 9:30am. More than 50% of the time in this consultation was spent coordinating communication.   HISTORY OF PRESENT ILLNESS:  Patient is a 58 year old male with colon cancer (metastasis to the liver), hypertension, anxiety disorder, recurrent diarrhea as well as chronic pain from cancer. Recent hospital admission 7/29-12/14/2018 where he was treated for hypotension and acute enteritis, possible right-sided colitis (CT scan). This is a f/u Palliative Care visit from 12/19/2018.  CODE STATUS: Full  PPS: 60% HOSPICE ELIGIBILITY/DIAGNOSIS: Yes, pending completion of palliative chemo  PAST MEDICAL HISTORY:  Past Medical History:  Diagnosis Date   Anxiety    Cancer of ascending colon ([place])    Hypertension    Seasonal allergies     SOCIAL HX:  Social History   Tobacco Use   Smoking status: Former Smoker    Years: 2.00    Types: Cigarettes    Quit date: 1990  Years since quitting: 30.6   Smokeless tobacco: Never Used  Substance Use Topics   Alcohol use: Yes    Alcohol/week: 20.0 standard drinks    Types: 20 Cans of beer per week    Comment: pt states he drinks about 3-4 beers per night    ALLERGIES:  Allergies  Allergen Reactions   Penicillins Other (See Comments)    UNSPECIFIED REACTION   Has patient had a PCN reaction causing immediate rash, facial/tongue/throat swelling, SOB or lightheadedness with hypotension: unknown Has  patient had a PCN reaction causing severe rash involving mucus membranes or skin necrosis: unknown Has patient had a PCN reaction that required hospitalization unknown Has patient had a PCN reaction occurring within the last 10 years: No If all of the above answers are "NO", then may proceed with Cephalosporin use.    Xeloda [Capecitabine] Nausea And Vomiting and Other (See Comments)    Foot pain, patient couldn't walk, and bleeding gums   Nivolumab Nausea And Vomiting, Palpitations and Other (See Comments)    Infusion reaction: Chills, rigors, numbness. Symptoms improved with ondansetron, clonidine, famotidine, meperidine     PERTINENT MEDICATIONS:  Outpatient Encounter Medications as of 12/26/2018  Medication Sig   albuterol (VENTOLIN HFA) 108 (90 Base) MCG/ACT inhaler Inhale 1-2 puffs into the lungs every 6 (six) hours as needed for wheezing or shortness of breath. (Patient taking differently: Inhale 1-2 puffs into the lungs every 4 (four) hours as needed for wheezing or shortness of breath. )   amLODipine (NORVASC) 10 MG tablet Take 1 tablet (10 mg total) by mouth daily.   clonazePAM (KLONOPIN) 0.5 MG tablet Take 0.5 mg by mouth 2 (two) times daily as needed for anxiety.   cyclobenzaprine (FLEXERIL) 5 MG tablet Take 1 tablet (5 mg total) by mouth 3 (three) times daily as needed for muscle spasms.   fluticasone (FLOVENT HFA) 110 MCG/ACT inhaler Inhale 2 puffs into the lungs 2 (two) times daily. (Patient taking differently: Inhale 2 puffs into the lungs 2 (two) times a day. )   furosemide (LASIX) 20 MG tablet Take 0.5 tablets (10 mg total) by mouth daily.   gabapentin (NEURONTIN) 300 MG capsule TAKE ONE CAPSULE BY MOUTH THREE TIMES DAILY (Patient taking differently: Take 300 mg by mouth 3 (three) times daily as needed (nerve pain). )   LORazepam (ATIVAN) 1 MG tablet Take 1 tablet (1 mg total) by mouth every 4 (four) hours as needed for anxiety or sleep (nausea).   megestrol (MEGACE  ES) 625 MG/5ML suspension Take 5 mLs (625 mg total) by mouth daily.   morphine (MS CONTIN) 100 MG 12 hr tablet Take 1 tablet (100 mg total) by mouth every 12 (twelve) hours.   Multiple Vitamin (MULTIVITAMIN WITH MINERALS) TABS tablet Take 1 tablet by mouth daily.   naloxone (NARCAN) nasal spray 4 mg/0.1 mL Place 1 spray into the nose once as needed (emergency).    ondansetron (ZOFRAN ODT) 8 MG disintegrating tablet Take 1 tablet (8 mg total) by mouth every 8 (eight) hours as needed for nausea or vomiting.   Oxycodone HCl 10 MG TABS Take 1-2 tablets (10-20 mg total) by mouth every 6 (six) hours as needed.   prochlorperazine (COMPAZINE) 10 MG tablet Take 1 tablet (10 mg total) by mouth every 6 (six) hours as needed for nausea or vomiting.   senna-docusate (SENOKOT-S) 8.6-50 MG tablet Take 2 tablets by mouth 2 (two) times daily. (Patient taking differently: Take 2 tablets by mouth as needed for mild  constipation. )   trifluridine-tipiracil (LONSURF) 20-8.19 MG tablet Take 4 tabs (80mg  trifluridine) PO in AM & 3 tabs (60mg  trifluridine) in PM, immediately after food on days 1-5 & 8-12 of each 28d cycle   venlafaxine XR (EFFEXOR-XR) 150 MG 24 hr capsule TAKE ONE CAPSULE BY MOUTH DAILY WITH BREAKFAST (Patient taking differently: Take 150 mg by mouth daily with breakfast. )   zolpidem (AMBIEN CR) 12.5 MG CR tablet Take 1 tablet (12.5 mg total) by mouth at bedtime. (Patient taking differently: Take 12.5 mg by mouth at bedtime as needed. )   No facility-administered encounter medications on file as of 12/26/2018.     PHYSICAL EXAM:   PE deferred d/t telephonic nature of visit.  Julianne Handler, NP

## 2018-12-26 NOTE — Telephone Encounter (Signed)
Spoke with patient's wife about his current condition.  Offered for him to come in to see Sandi Mealy today in [facility] and possible get IVF.  She wanted information about Hospice and I answered her questions.  She states they discussed Hospice at the beginning of the week and she is going to talk to him when she gets home from work about this.  She will call us back tomorrow and let us know their decision.

## 2018-12-27 ENCOUNTER — Other Ambulatory Visit: Payer: Self-pay

## 2018-12-27 ENCOUNTER — Telehealth: Payer: Self-pay | Admitting: Hematology

## 2018-12-27 ENCOUNTER — Encounter: Payer: Self-pay | Admitting: Hematology

## 2018-12-27 ENCOUNTER — Telehealth: Payer: Self-pay

## 2018-12-27 DIAGNOSIS — C787 Secondary malignant neoplasm of liver and intrahepatic bile duct: Secondary | ICD-10-CM

## 2018-12-27 DIAGNOSIS — C189 Malignant neoplasm of colon, unspecified: Secondary | ICD-10-CM

## 2018-12-27 NOTE — Telephone Encounter (Signed)
Faxed referral for Hospice services to [phone].

## 2018-12-27 NOTE — Telephone Encounter (Signed)
Got message from pt's wife Nicholas Lowery that she has discussed with Nicholas Lowery about hospice yesterday, and he agreed.  Hospice referral was made today, they will meet the patient and Nicholas Lowery at home tomorrow.  I called patient, left him a voice message to support his decision and let him know I will continue to be his doctor and manage his medical issues and support him.  I also called Nicholas Lowery and discussed with her. She appreciate the call. Will cancel his future appointments with [place].   Truitt Merle  12/27/2018

## 2018-12-30 ENCOUNTER — Other Ambulatory Visit: Payer: BC Managed Care – PPO

## 2018-12-30 ENCOUNTER — Ambulatory Visit: Payer: BC Managed Care – PPO | Admitting: Nurse Practitioner

## 2018-12-31 ENCOUNTER — Telehealth: Payer: Self-pay | Admitting: *Deleted

## 2018-12-31 NOTE — Telephone Encounter (Signed)
Nicholas Melnick, RN from [facility] called. States Nicholas Lowery has had altered mental status, they are going to try haldol. "Nicholas Lowery has been hallucinating, reaching and twitching. Is dehydrated and only taking sips of fluid, BP 92/64"  States family is asking how long Nicholas Lowery has.

## 2018-12-31 NOTE — Telephone Encounter (Signed)
I called Maura back and discussed residential hospice, Cristela Blue will talk to his wife and agrees he is close to that point. I called pt's wife Tye Maryland and left her a message about this, and encouraged her to call me if anything I can help.   Truitt Merle MD

## 2019-01-07 ENCOUNTER — Encounter: Payer: Self-pay | Admitting: Nurse Practitioner

## 2019-01-07 ENCOUNTER — Encounter: Payer: Self-pay | Admitting: Hematology

## 2019-01-07 ENCOUNTER — Encounter: Payer: Self-pay | Admitting: Medical

## 2019-01-14 DEATH — deceased

## 2020-11-06 IMAGING — CT CT ANGIOGRAPHY CHEST
2 of 6 series · 16 of 46 positions shown · IV contrast (ISOVUE)
Comparison: Similar exam dated 11/13/2018

CLINICAL DATA: History of metastatic colon carcinoma with fever and
abdominal pain

EXAM:
CT ANGIOGRAPHY CHEST
CT ABDOMEN AND PELVIS WITH CONTRAST
TECHNIQUE: Multidetector CT imaging of the chest was performed using the
standard protocol during bolus administration of intravenous
contrast. Multiplanar CT image reconstructions and MIPs were
obtained to evaluate the vascular anatomy. Multidetector CT imaging
of the abdomen and pelvis was performed using the standard protocol
during bolus administration of intravenous contrast.
CONTRAST:  80mL OMNIPAQUE IOHEXOL 350 MG/ML SOLN

[Series 5: thins · axial · 0.87mm/px · z∈[+1595,+1887]mm · 13 of 320 slices shown]
[im 14/320  lung]
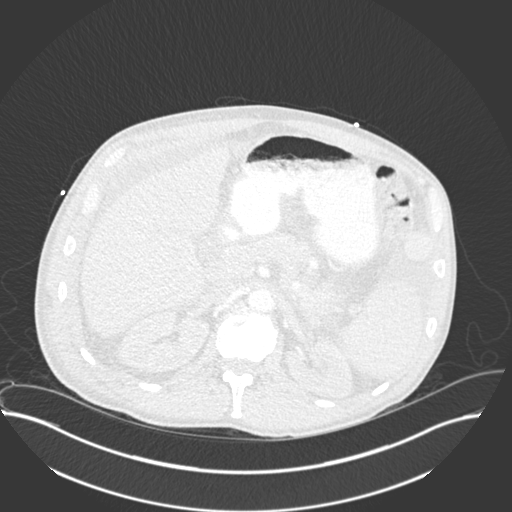
[im 42/320  soft-tissue]
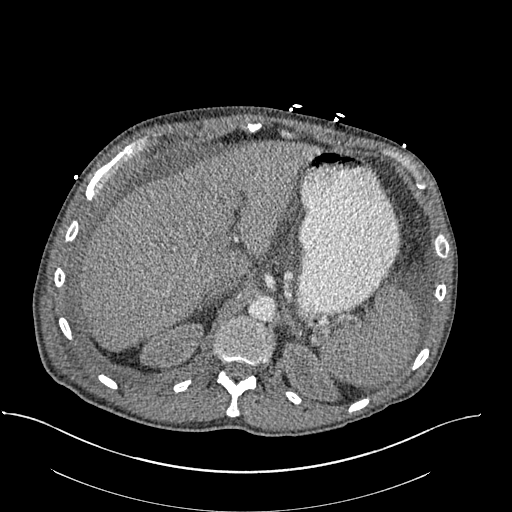
[im 70/320  lung]
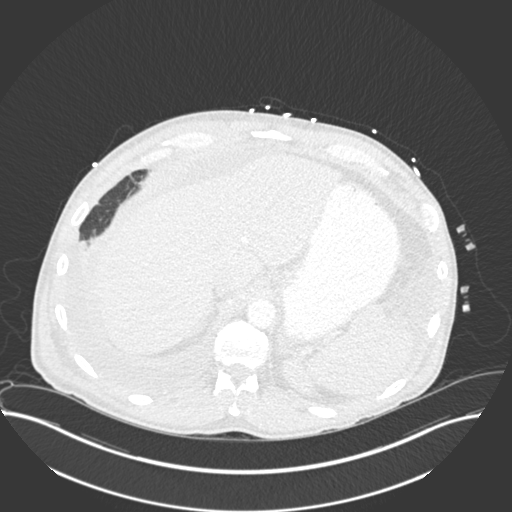
[im 84/320  soft-tissue]
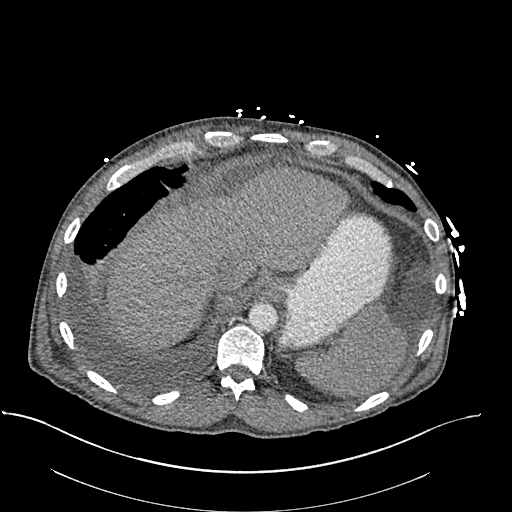
[im 111/320  lung]
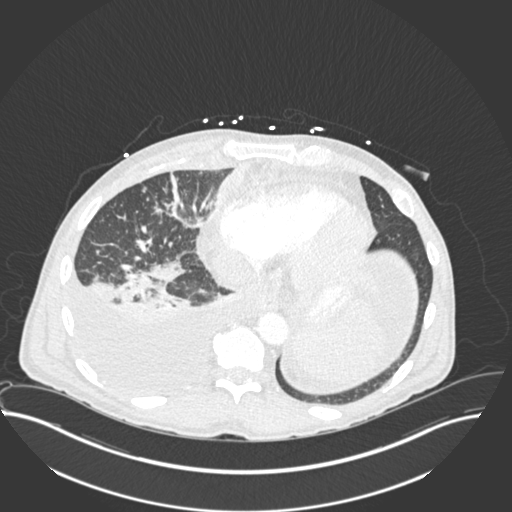
[im 139/320  soft-tissue]
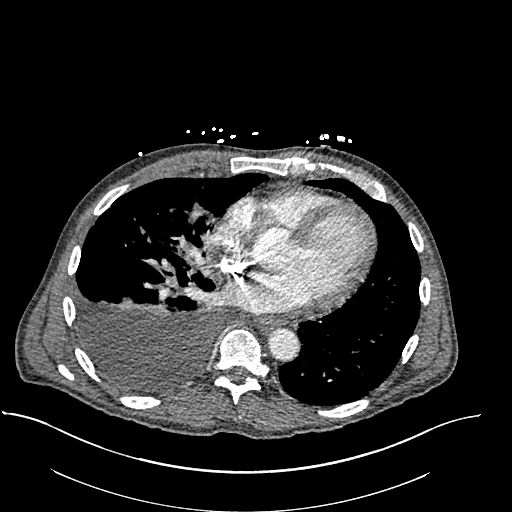
[im 167/320  lung]
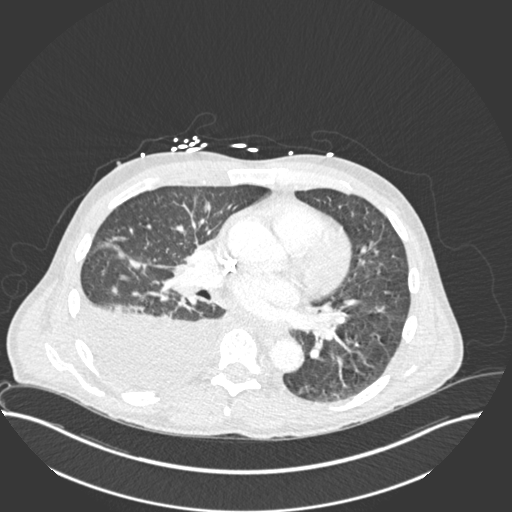
[im 181/320  soft-tissue]
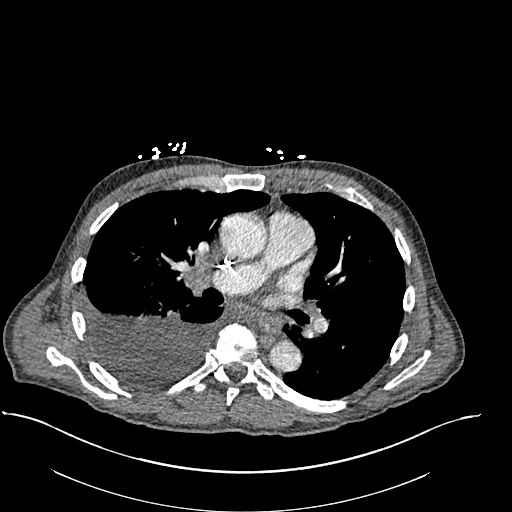
[im 209/320  lung]
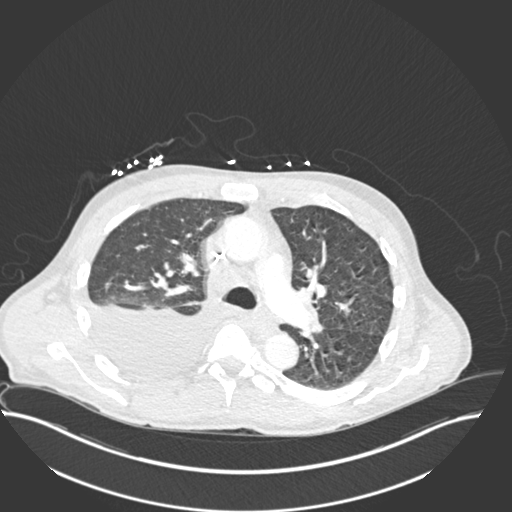
[im 236/320  soft-tissue]
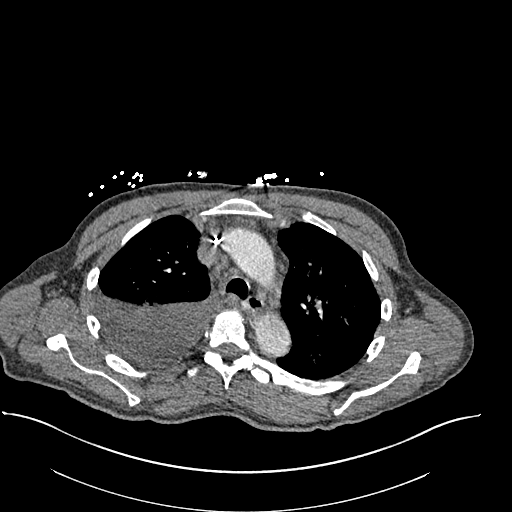
[im 250/320  lung]
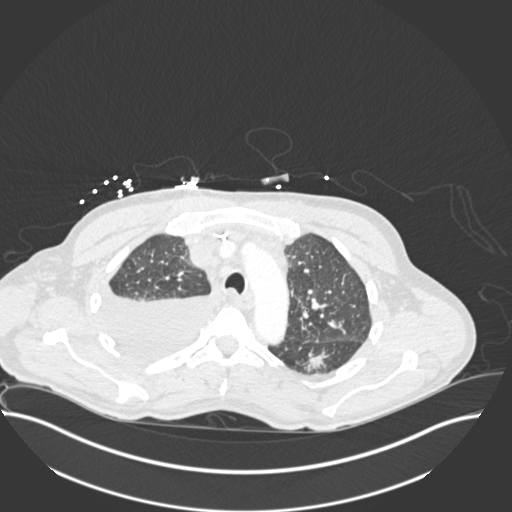
[im 278/320  soft-tissue]
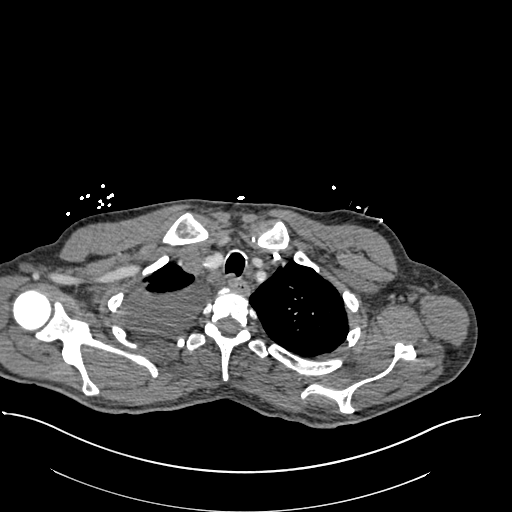
[im 306/320  lung]
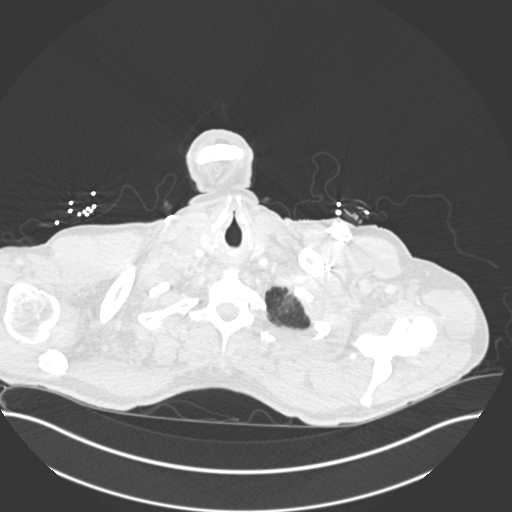

[Series 6: coronal mpr · coronal · 0.69mm/px · 3 of 141 slices shown]
[im 36/141  soft-tissue]
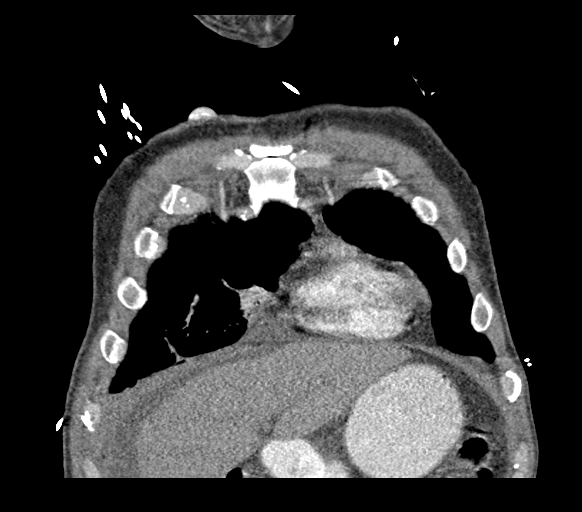
[im 71/141  soft-tissue]
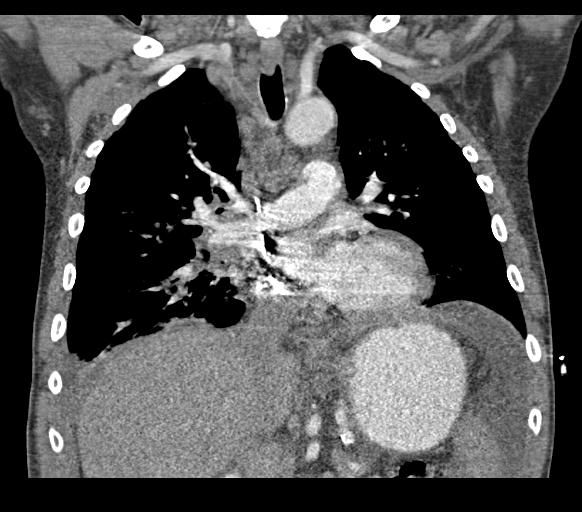
[im 106/141  soft-tissue]
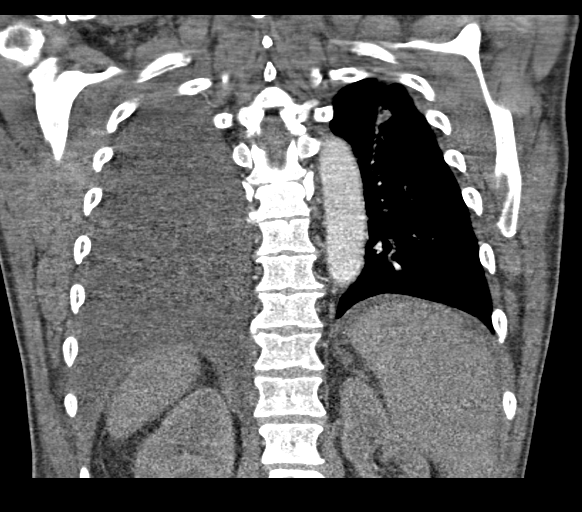

[16 of 46 positions shown; findings below may reference images not displayed]

FINDINGS: CTA CHEST FINDINGS

Cardiovascular: Thoracic aorta demonstrates a normal branching
pattern. No aneurysmal dilatation or dissection is seen. The heart
is normal in size and configuration. The pulmonary artery shows a
normal branching pattern. No definitive filling defect to suggest
pulmonary embolism is noted.

Mediastinum/Nodes: Thoracic inlet is within normal limits. The
esophagus is within normal limits. Scattered mediastinal lymph nodes
are again identified similar to that seen on the recent restaging
examination from 11 days previous. Stable right hilar lymph node is
noted as well.

Lungs/Pleura: The lungs are well aerated bilaterally. Nodules are
again identified bilaterally. The overall appearance is stable from
the prior exam from 11 days previous. An enlarging right-sided
pleural effusion is seen. New superimposed right lower lobe and
right middle lobe infiltrate is seen.

Musculoskeletal: Degenerative changes of the thoracic spine are
noted. No metastatic lesions are seen. Left-sided chest wall port is
noted

Review of the MIP images confirms the above findings.

CT ABDOMEN and PELVIS FINDINGS

Hepatobiliary: Liver again demonstrates a hypodense lesion within
the right lobe consistent with metastatic disease measuring 4.2 cm
in greatest dimension. Small hypodensity in the dome of the liver is
noted also stable from the prior study. Gallbladder demonstrates
vicarious excretion of contrast. No ductal dilatation is seen.

Pancreas: Unremarkable. No pancreatic ductal dilatation or
surrounding inflammatory changes.

Spleen: Normal in size without focal abnormality.

Adrenals/Urinary Tract: Adrenal glands are within normal limits.
Kidneys demonstrate no renal calculi or obstructive changes
bilaterally. Normal excretion of contrast is seen. The bladder is
decompressed.

Stomach/Bowel: Colon is well visualized. Postsurgical changes are
noted in the mid transverse colon consistent with the prior surgical
history. There is again noted soft tissue mass in the mesentery
adjacent to the anastomotic site best seen in the coronal plane on
image number 40 of series 4. Just proximal to the small bowel colon
anastomotic site there is some mild dilatation of the distal small
bowel identified. Multiple edematous loops of small bowel are noted
proximal to the anastomotic site. A mild partial small bowel
obstruction at the anastomotic site cannot be totally excluded.

Vascular/Lymphatic: No aneurysmal dilatation is seen. The encasement
of the superior mesenteric artery and vein by the adjacent
mesenteric mass is less well visualized on today's exam due to the
timing of the contrast bolus. There does remain however venous
engorgement of the small bowel contributing to the degree of small
bowel wall edema and likely ascites.

Reproductive: Prostate is unremarkable.

Other: Increase in ascites is noted both in the pelvis as well as
surrounding the bowel loops and liver. Peritoneal implant is again
noted near the umbilicus.

Musculoskeletal: Degenerative changes of lumbar spine are seen.

Review of the MIP images confirms the above findings.
IMPRESSION: CTA of the chest: No evidence of pulmonary emboli are identified.
Stable right hilar and mediastinal adenopathy is seen.

Stable metastatic lesions within both lungs.

Increasing right-sided pleural effusion with associated right lower
and middle lobe infiltrates

CT of the abdomen and pelvis: Postsurgical changes consistent with
the given clinical history.

There remains a mesenteric mass near the anastomotic site similar to
that seen on the recent exam. Stable appearing hepatic metastatic
disease is noted.

Persistent venous engorgement is noted related to vascular
encasement with new small bowel edematous changes identified. Some
mild dilatation of the distal small bowel proximal to the
anastomosis is seen. The possibility of mild narrowing at the
anastomosis deserves consideration. This was not present on the most
recent exam. This may be accentuated by the venous engorgement and
small-bowel wall edema.

Increasing ascites. There are changes consistent with peritoneal
implantation stable from the prior exam.

## 2020-11-07 IMAGING — US US THORACENTESIS ASP PLEURAL SPACE W/IMG GUIDE
1 series · 4 of 4 positions shown · non-contrast
Comparison: none

INDICATION: Patient with history of shortness of breath, pneumonia, right
pleural effusion. Request made for diagnostic and therapeutic
thoracentesis.

[Series 1: us thoracentesis asp pleural space w/img guide · 4 of 4 slices shown]
[im 1/4]
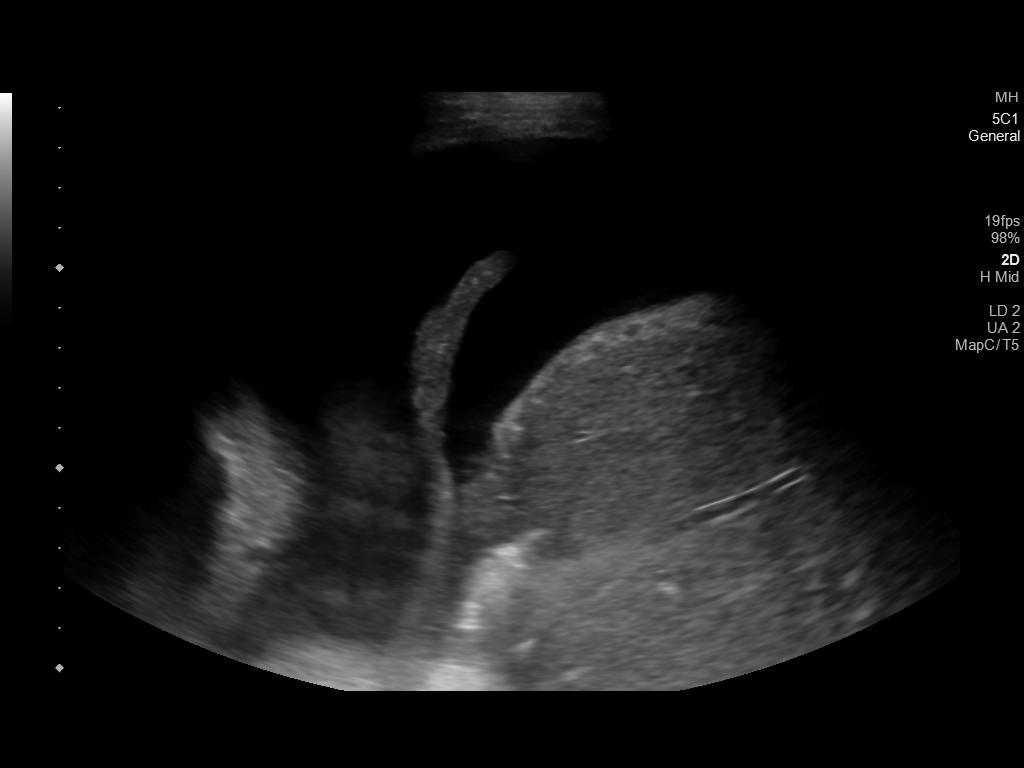
[im 2/4]
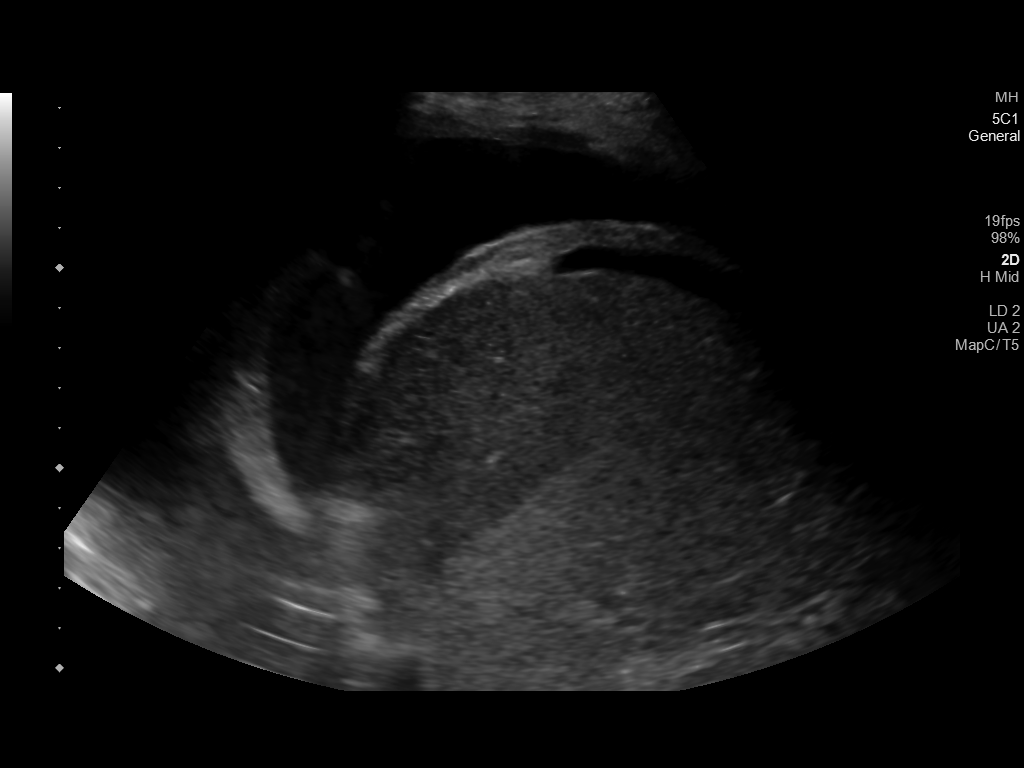
[im 3/4]
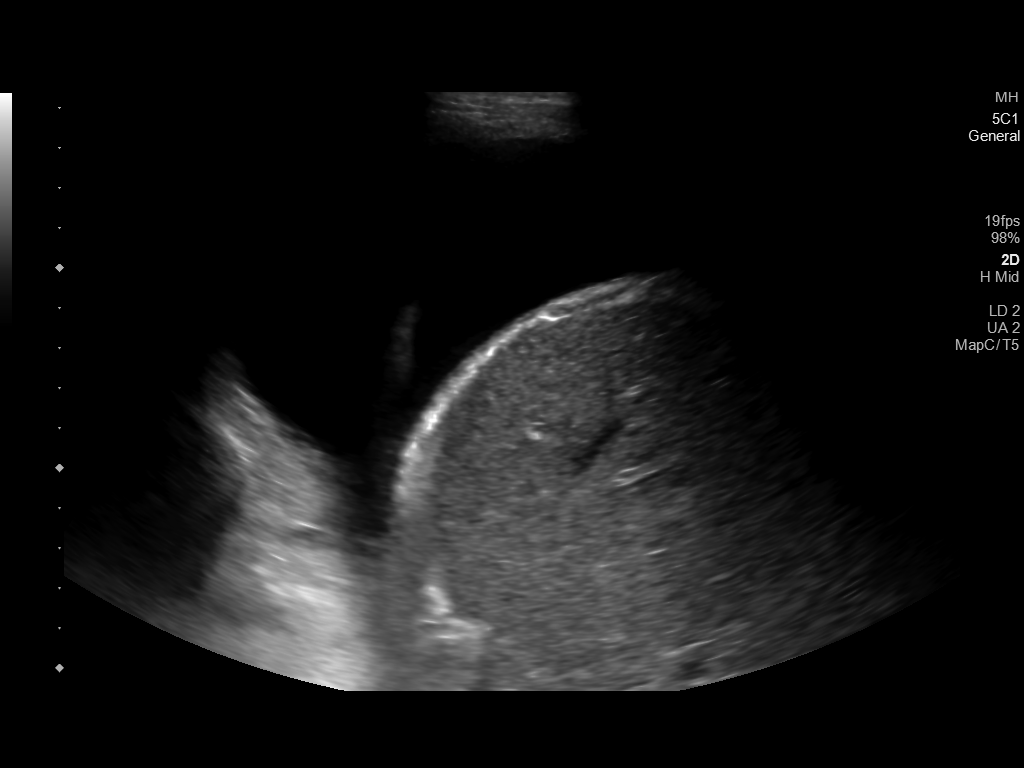
[im 4/4]
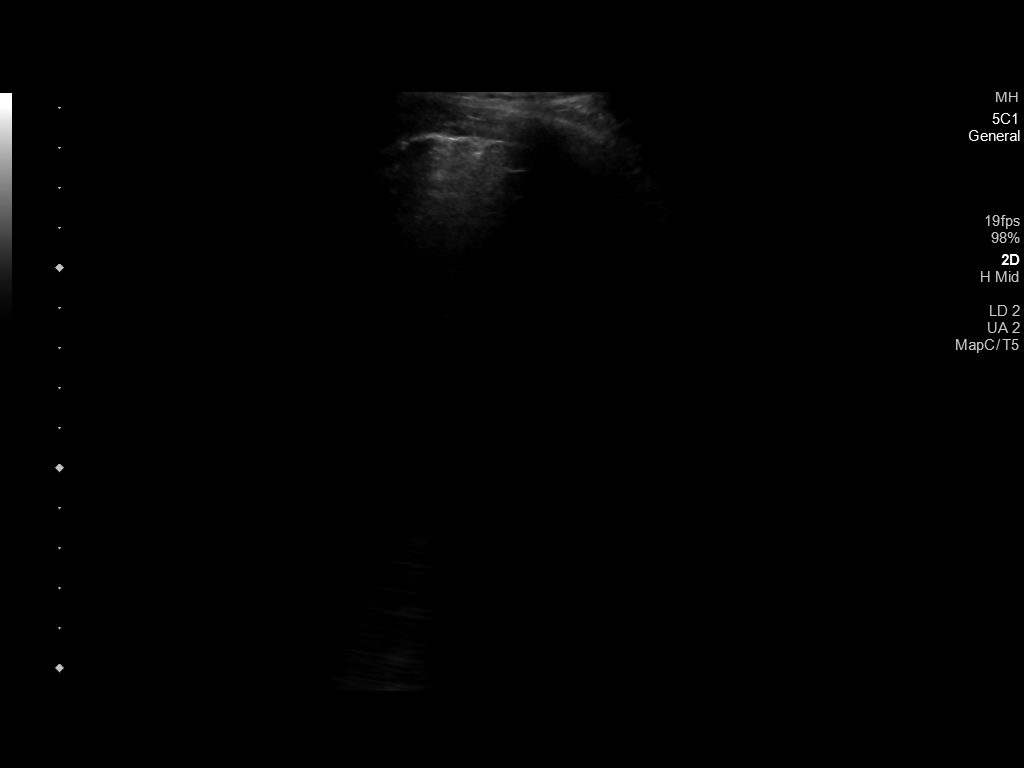

[4 of 4 positions shown; findings below may reference images not displayed]

EXAM:
ULTRASOUND GUIDED RIGHT DIAGNOSTIC AND THERAPEUTIC THORACENTESIS

MEDICATIONS:
10 mL 1% lidocaine

COMPLICATIONS:
None immediate.

PROCEDURE:
An ultrasound guided thoracentesis was thoroughly discussed with the
patient and questions answered. The benefits, risks, alternatives
and complications were also discussed. The patient understands and
wishes to proceed with the procedure. Written consent was obtained.

Ultrasound was performed to localize and mark an adequate pocket of
fluid in the right chest. The area was then prepped and draped in
the normal sterile fashion. 1% Lidocaine was used for local
anesthesia. Under ultrasound guidance a 6 Fr Safe-T-Centesis
catheter was introduced. Thoracentesis was performed. The catheter
was removed and a dressing applied.
FINDINGS: A total of approximately 1.3 liters of clear, yellow fluid was
removed. Samples were sent to the laboratory as requested by the
clinical team.
IMPRESSION: Successful ultrasound guided diagnostic and therapeutic right
thoracentesis yielding 1.3 liters of pleural fluid.

## 2020-11-07 IMAGING — DX CHEST  1 VIEW
1 series · 1 of 1 positions shown · non-contrast
Comparison: None.

CLINICAL DATA: Status post RIGHT thoracentesis.

EXAM:
CHEST  1 VIEW

[chest pa]
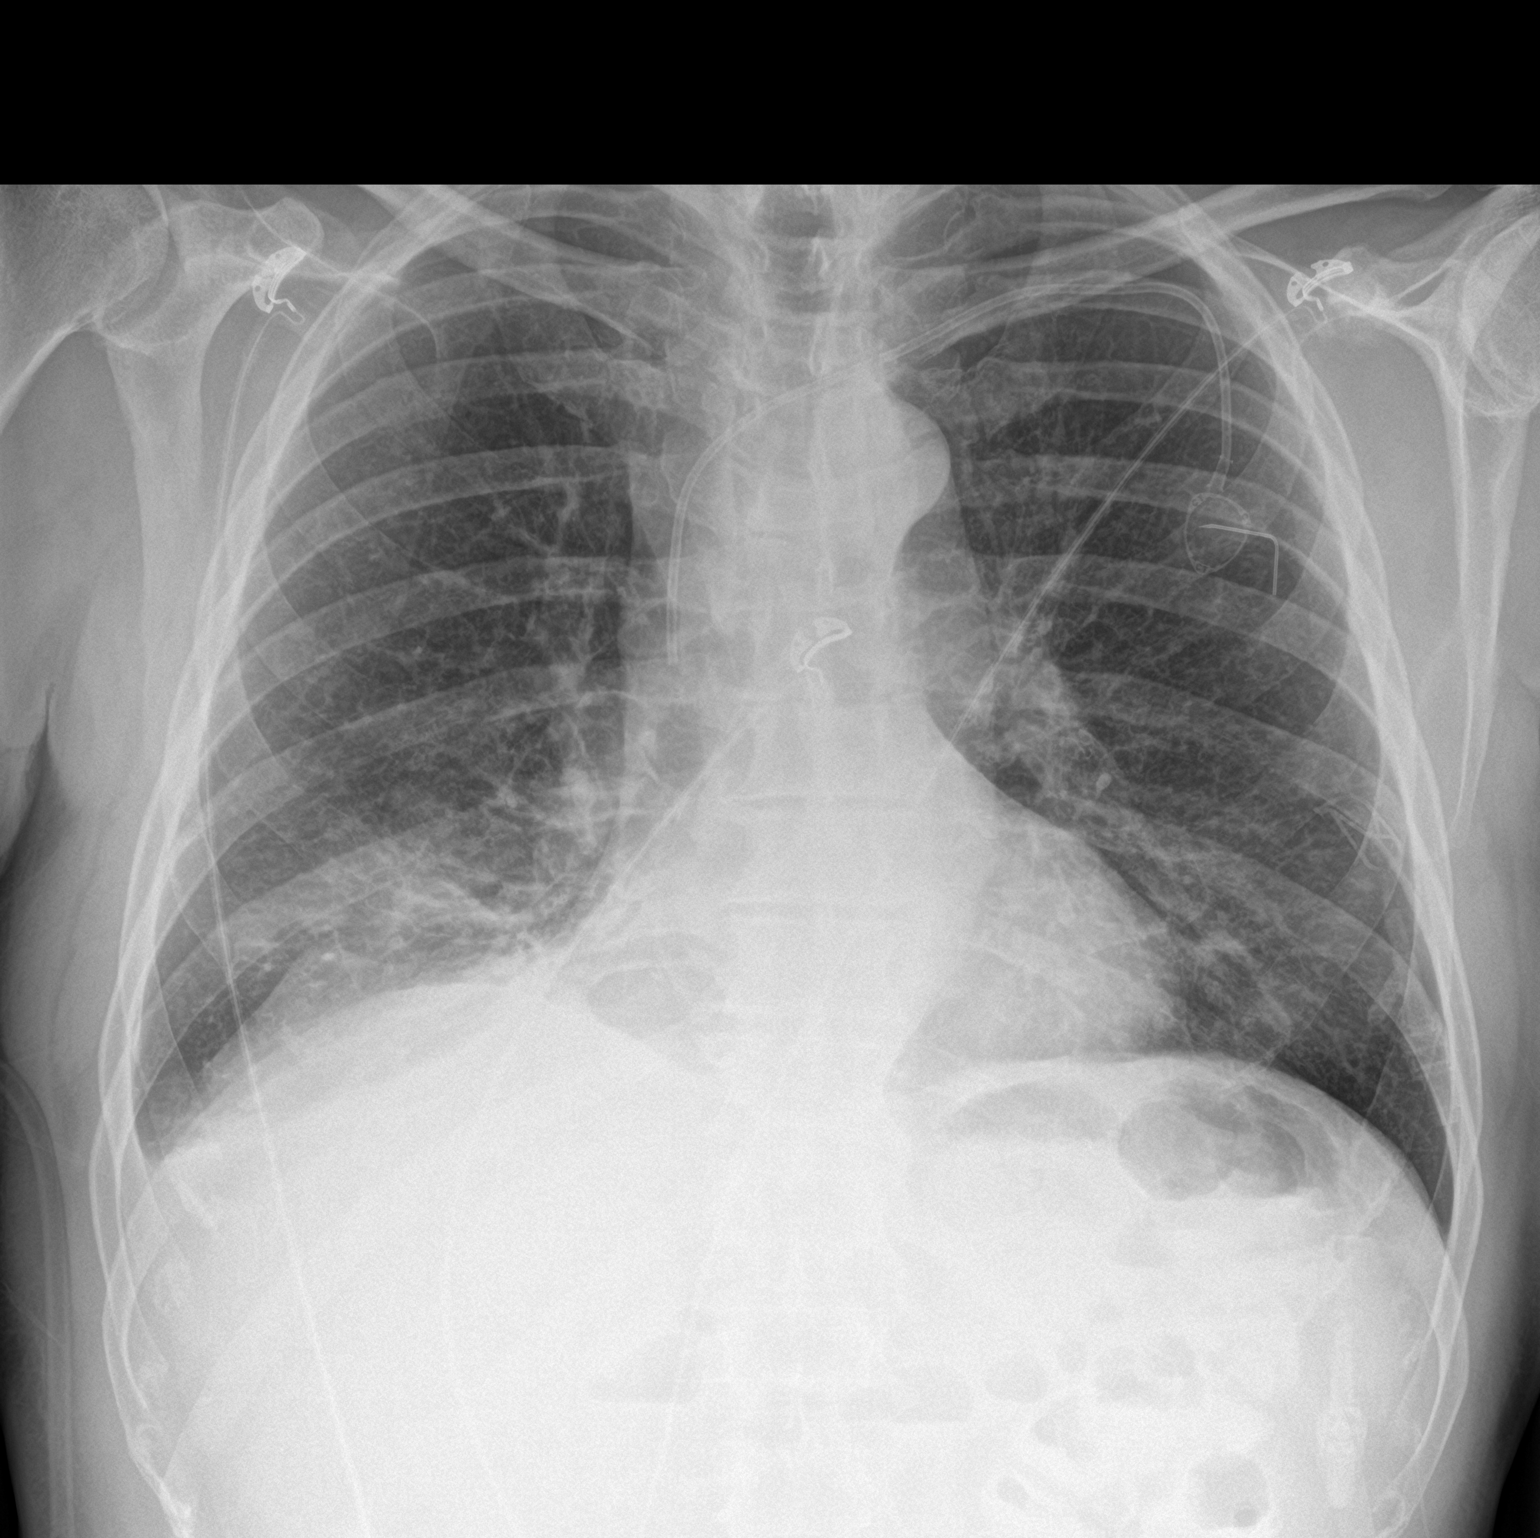

[1 of 1 positions shown; findings below may reference images not displayed]

FINDINGS: The RIGHT pleural effusion has decreased in size. Mild RIGHT basilar
atelectasis persists.

There is no evidence of pneumothorax.

A LEFT Port-A-Cath is again noted.

The LEFT lung is clear.
IMPRESSION: Decreased RIGHT pleural effusion with mild persistent RIGHT basilar
atelectasis. No pneumothorax.
